# Patient Record
Sex: Male | Born: 1940 | Race: White | Hispanic: No | Marital: Married | Smoking: Never smoker
Health system: Southern US, Community
[De-identification: ages and names within clinical notes are randomized; demographics above are authoritative.]

## PROBLEM LIST (undated history)

## (undated) DIAGNOSIS — K219 Gastro-esophageal reflux disease without esophagitis: Secondary | ICD-10-CM

## (undated) DIAGNOSIS — F0390 Unspecified dementia without behavioral disturbance: Secondary | ICD-10-CM

## (undated) DIAGNOSIS — I10 Essential (primary) hypertension: Secondary | ICD-10-CM

## (undated) DIAGNOSIS — E781 Pure hyperglyceridemia: Secondary | ICD-10-CM

## (undated) DIAGNOSIS — N400 Enlarged prostate without lower urinary tract symptoms: Secondary | ICD-10-CM

## (undated) DIAGNOSIS — R972 Elevated prostate specific antigen [PSA]: Secondary | ICD-10-CM

## (undated) DIAGNOSIS — R351 Nocturia: Secondary | ICD-10-CM

## (undated) DIAGNOSIS — E119 Type 2 diabetes mellitus without complications: Secondary | ICD-10-CM

## (undated) HISTORY — DX: Type 2 diabetes mellitus without complications: E11.9

## (undated) HISTORY — DX: Nocturia: R35.1

## (undated) HISTORY — PX: EYE SURGERY: SHX253

## (undated) HISTORY — DX: Elevated prostate specific antigen (PSA): R97.20

## (undated) HISTORY — DX: Benign prostatic hyperplasia without lower urinary tract symptoms: N40.0

## (undated) HISTORY — DX: Pure hyperglyceridemia: E78.1

## (undated) HISTORY — PX: APPENDECTOMY: SHX54

## (undated) HISTORY — DX: Essential (primary) hypertension: I10

---

## 2011-03-10 ENCOUNTER — Ambulatory Visit: Payer: Self-pay | Admitting: Gastroenterology

## 2011-03-10 LAB — HM COLONOSCOPY

## 2011-04-19 ENCOUNTER — Emergency Department: Payer: Self-pay | Admitting: Internal Medicine

## 2011-05-11 ENCOUNTER — Inpatient Hospital Stay: Payer: Self-pay | Admitting: Internal Medicine

## 2011-09-26 ENCOUNTER — Emergency Department: Payer: Self-pay | Admitting: Emergency Medicine

## 2011-11-26 ENCOUNTER — Emergency Department: Payer: Self-pay | Admitting: Emergency Medicine

## 2012-09-20 ENCOUNTER — Emergency Department: Payer: Self-pay | Admitting: Emergency Medicine

## 2013-12-19 ENCOUNTER — Ambulatory Visit: Payer: Self-pay | Admitting: Ophthalmology

## 2013-12-26 ENCOUNTER — Ambulatory Visit: Payer: Self-pay | Admitting: Ophthalmology

## 2014-01-09 ENCOUNTER — Ambulatory Visit: Payer: Self-pay | Admitting: Ophthalmology

## 2014-07-15 ENCOUNTER — Inpatient Hospital Stay: Payer: Self-pay | Admitting: Internal Medicine

## 2014-07-15 DIAGNOSIS — Z800 Family history of malignant neoplasm of digestive organs: Secondary | ICD-10-CM | POA: Diagnosis not present

## 2014-07-15 DIAGNOSIS — E119 Type 2 diabetes mellitus without complications: Secondary | ICD-10-CM | POA: Diagnosis not present

## 2014-07-15 DIAGNOSIS — R69 Illness, unspecified: Secondary | ICD-10-CM | POA: Diagnosis not present

## 2014-07-15 DIAGNOSIS — E860 Dehydration: Secondary | ICD-10-CM | POA: Diagnosis not present

## 2014-07-15 DIAGNOSIS — Z7982 Long term (current) use of aspirin: Secondary | ICD-10-CM | POA: Diagnosis not present

## 2014-07-15 DIAGNOSIS — I10 Essential (primary) hypertension: Secondary | ICD-10-CM | POA: Diagnosis not present

## 2014-07-15 DIAGNOSIS — N179 Acute kidney failure, unspecified: Secondary | ICD-10-CM | POA: Diagnosis not present

## 2014-07-15 DIAGNOSIS — I959 Hypotension, unspecified: Secondary | ICD-10-CM | POA: Diagnosis not present

## 2014-07-15 DIAGNOSIS — R0602 Shortness of breath: Secondary | ICD-10-CM | POA: Diagnosis not present

## 2014-07-15 DIAGNOSIS — Z833 Family history of diabetes mellitus: Secondary | ICD-10-CM | POA: Diagnosis not present

## 2014-07-15 DIAGNOSIS — R531 Weakness: Secondary | ICD-10-CM | POA: Diagnosis not present

## 2014-07-15 DIAGNOSIS — E785 Hyperlipidemia, unspecified: Secondary | ICD-10-CM | POA: Diagnosis not present

## 2014-07-15 DIAGNOSIS — R55 Syncope and collapse: Secondary | ICD-10-CM | POA: Diagnosis not present

## 2014-07-15 LAB — BASIC METABOLIC PANEL
Anion Gap: 7 (ref 7–16)
BUN: 34 mg/dL — AB (ref 7–18)
CALCIUM: 8.8 mg/dL (ref 8.5–10.1)
Chloride: 106 mmol/L (ref 98–107)
Co2: 27 mmol/L (ref 21–32)
Creatinine: 2.28 mg/dL — ABNORMAL HIGH (ref 0.60–1.30)
EGFR (African American): 36 — ABNORMAL LOW
EGFR (Non-African Amer.): 30 — ABNORMAL LOW
Glucose: 195 mg/dL — ABNORMAL HIGH (ref 65–99)
Osmolality: 292 (ref 275–301)
POTASSIUM: 4.6 mmol/L (ref 3.5–5.1)
Sodium: 140 mmol/L (ref 136–145)

## 2014-07-15 LAB — CBC WITH DIFFERENTIAL/PLATELET
BASOS ABS: 0.1 10*3/uL (ref 0.0–0.1)
Basophil %: 0.8 %
Eosinophil #: 0.2 10*3/uL (ref 0.0–0.7)
Eosinophil %: 1.7 %
HCT: 39.9 % — ABNORMAL LOW (ref 40.0–52.0)
HGB: 13.2 g/dL (ref 13.0–18.0)
LYMPHS ABS: 1.5 10*3/uL (ref 1.0–3.6)
Lymphocyte %: 15.1 %
MCH: 30.1 pg (ref 26.0–34.0)
MCHC: 33.1 g/dL (ref 32.0–36.0)
MCV: 91 fL (ref 80–100)
MONO ABS: 0.8 x10 3/mm (ref 0.2–1.0)
MONOS PCT: 7.7 %
Neutrophil #: 7.7 10*3/uL — ABNORMAL HIGH (ref 1.4–6.5)
Neutrophil %: 74.7 %
Platelet: 230 10*3/uL (ref 150–440)
RBC: 4.39 10*6/uL — ABNORMAL LOW (ref 4.40–5.90)
RDW: 13.5 % (ref 11.5–14.5)
WBC: 10.3 10*3/uL (ref 3.8–10.6)

## 2014-07-15 LAB — TROPONIN I: Troponin-I: <0.02 ng/mL

## 2014-07-16 LAB — URINALYSIS, COMPLETE
BACTERIA: NONE SEEN
BILIRUBIN, UR: NEGATIVE
BLOOD: NEGATIVE
Glucose,UR: >=500 mg/dL (ref 0–75)
KETONE: NEGATIVE
Leukocyte Esterase: NEGATIVE
Nitrite: NEGATIVE
Ph: 5 (ref 4.5–8.0)
Protein: NEGATIVE
RBC,UR: <1 /HPF (ref 0–5)
SQUAMOUS EPITHELIAL: NONE SEEN
Specific Gravity: 1.018 (ref 1.003–1.030)
WBC UR: 1 /HPF (ref 0–5)

## 2014-07-16 LAB — BASIC METABOLIC PANEL
Anion Gap: 9 (ref 7–16)
BUN: 36 mg/dL — ABNORMAL HIGH (ref 7–18)
CALCIUM: 8.5 mg/dL (ref 8.5–10.1)
CHLORIDE: 109 mmol/L — AB (ref 98–107)
CO2: 24 mmol/L (ref 21–32)
CREATININE: 1.5 mg/dL — AB (ref 0.60–1.30)
EGFR (Non-African Amer.): 49 — ABNORMAL LOW
GFR CALC AF AMER: 59 — AB
Glucose: 142 mg/dL — ABNORMAL HIGH (ref 65–99)
Osmolality: 294 (ref 275–301)
Potassium: 3.7 mmol/L (ref 3.5–5.1)
Sodium: 142 mmol/L (ref 136–145)

## 2014-07-16 LAB — HEMOGLOBIN A1C: Hemoglobin A1C: 9.7 % — ABNORMAL HIGH (ref 4.2–6.3)

## 2014-07-16 LAB — TSH: Thyroid Stimulating Horm: 1.13 u[IU]/mL

## 2014-07-16 LAB — MAGNESIUM: Magnesium: 1.9 mg/dL

## 2014-07-17 LAB — BASIC METABOLIC PANEL
ANION GAP: 8 (ref 7–16)
BUN: 22 mg/dL — ABNORMAL HIGH (ref 7–18)
CALCIUM: 8.5 mg/dL (ref 8.5–10.1)
CHLORIDE: 105 mmol/L (ref 98–107)
CO2: 23 mmol/L (ref 21–32)
Creatinine: 1.21 mg/dL (ref 0.60–1.30)
EGFR (African American): >60
EGFR (Non-African Amer.): >60
Glucose: 268 mg/dL — ABNORMAL HIGH (ref 65–99)
OSMOLALITY: 285 (ref 275–301)
Potassium: 4.2 mmol/L (ref 3.5–5.1)
Sodium: 136 mmol/L (ref 136–145)

## 2014-07-31 DIAGNOSIS — E1122 Type 2 diabetes mellitus with diabetic chronic kidney disease: Secondary | ICD-10-CM | POA: Diagnosis not present

## 2014-07-31 DIAGNOSIS — N179 Acute kidney failure, unspecified: Secondary | ICD-10-CM | POA: Diagnosis not present

## 2014-07-31 DIAGNOSIS — I129 Hypertensive chronic kidney disease with stage 1 through stage 4 chronic kidney disease, or unspecified chronic kidney disease: Secondary | ICD-10-CM | POA: Diagnosis not present

## 2014-08-21 DIAGNOSIS — I129 Hypertensive chronic kidney disease with stage 1 through stage 4 chronic kidney disease, or unspecified chronic kidney disease: Secondary | ICD-10-CM | POA: Diagnosis not present

## 2014-08-21 DIAGNOSIS — E1122 Type 2 diabetes mellitus with diabetic chronic kidney disease: Secondary | ICD-10-CM | POA: Diagnosis not present

## 2014-08-21 DIAGNOSIS — R1030 Lower abdominal pain, unspecified: Secondary | ICD-10-CM | POA: Diagnosis not present

## 2014-08-21 DIAGNOSIS — E785 Hyperlipidemia, unspecified: Secondary | ICD-10-CM | POA: Diagnosis not present

## 2014-08-27 DIAGNOSIS — E1122 Type 2 diabetes mellitus with diabetic chronic kidney disease: Secondary | ICD-10-CM | POA: Diagnosis not present

## 2014-08-27 DIAGNOSIS — I129 Hypertensive chronic kidney disease with stage 1 through stage 4 chronic kidney disease, or unspecified chronic kidney disease: Secondary | ICD-10-CM | POA: Diagnosis not present

## 2014-08-27 DIAGNOSIS — N182 Chronic kidney disease, stage 2 (mild): Secondary | ICD-10-CM | POA: Diagnosis not present

## 2014-08-27 DIAGNOSIS — R1013 Epigastric pain: Secondary | ICD-10-CM | POA: Diagnosis not present

## 2014-10-06 NOTE — Op Note (Signed)
PATIENT NAME:  Scott Lawson, Scott Lawson MR#:  [id number] DATE OF BIRTH:  03/27/1941  DATE OF PROCEDURE:  12/26/2013  PREOPERATIVE DIAGNOSIS: Visually significant cataract of the left eye.   POSTOPERATIVE DIAGNOSIS: Visually significant cataract of the left eye.   OPERATIVE PROCEDURE: Cataract extraction by phacoemulsification with implant of intraocular lens to left eye.   SURGEON: Birder Robson, MD.   ANESTHESIA:  1. Managed anesthesia care.  2. Topical tetracaine drops followed by 2% Xylocaine jelly applied in the preoperative holding area.   COMPLICATIONS: None.   TECHNIQUE: Stop and chop.   DESCRIPTION OF PROCEDURE: The patient was examined and consented in the preoperative holding area where the aforementioned topical anesthesia was applied to the left eye and then brought back to the Operating Room where the left eye was prepped and draped in the usual sterile ophthalmic fashion and a lid speculum was placed. A paracentesis was created with the side port blade and the anterior chamber was filled with viscoelastic. A near clear corneal incision was performed with the steel keratome. A continuous curvilinear capsulorrhexis was performed with a cystotome followed by the capsulorrhexis forceps. Hydrodissection and hydrodelineation were carried out with BSS on a blunt cannula. The lens was removed in a stop and chop technique and the remaining cortical material was removed with the irrigation-aspiration handpiece. The capsular bag was inflated with viscoelastic and the Tecnis ZCB00 23.5-diopter lens, serial number [id number] was placed in the capsular bag without complication. The remaining viscoelastic was removed from the eye with the irrigation-aspiration handpiece. The wounds were hydrated. The anterior chamber was flushed with Miostat and the eye was inflated to physiologic pressure. 0.1 mL of cefuroxime concentration 10 mg/mL was placed in the anterior chamber. The wounds were found to be water  tight. The eye was dressed with Vigamox. The patient was given protective glasses to wear throughout the day and a shield with which to sleep tonight. The patient was also given drops with which to begin a drop regimen today and will follow-up with me in one day.    ____________________________ Livingston Diones. Julena Barbour, MD wlp:lt D: 12/26/2013 21:45:45 ET T: 12/27/2013 00:45:17 ET JOB#: [id number]  cc: Berdell L. Joshua Zeringue, MD, <Dictator> Livingston Diones Karlisha Mathena MD ELECTRONICALLY SIGNED 01/10/2014 17:07

## 2014-10-14 NOTE — Discharge Summary (Signed)
PATIENT NAME:  Scott Lawson, Scott Lawson MR#:  [id number] DATE OF BIRTH:  04-06-1941  DATE OF ADMISSION:  07/15/2014 DATE OF DISCHARGE:  07/17/2014  DISCHARGE DIAGNOSES:  1.  Acute renal failure, likely due to dehydration. 2.  Hypertension due to dehydration.   SECONDARY DIAGNOSES:   Hypertension, diabetes, and renal failure.  CONSULTATIONS: None.   PROCEDURES AND RADIOLOGY: Chest x-ray on January 31 showed no acute cardiopulmonary disease.   MAJOR LABORATORY PANEL: UA on admission was negative.   HISTORY AND SHORT HOSPITAL COURSE: The patient is a 74 year old male with above-mentioned medical problems who was admitted for generalized weakness, was found to have acute renal failure, thought to be due to dehydration and hypertension. Please see Dr. Lianne Moris dictated history and physical for further details. The patient was started on IV hydration, avoided any nephrotoxic medication. His renal function was slowly improving from 2.28 on admission and was normalized to 1.21 creatinine. On the day of discharge, he was feeling much better, was feeling back to baseline, was discharged home on February 2. On the date of discharge, his vital signs were are as follows: Temperature 97.3, heart rate 59 per minute, respirations 18 per minute, blood pressure 120/62. He was saturating 96% room air.   PERTINENT PHYSICAL EXAMINATION ON THE DATE OF DISCHARGE:  CARDIOVASCULAR: S1, S2 normal. No murmurs, rubs or gallops.  LUNGS: Clear to auscultation bilaterally. No wheezing, rales, rhonchi, or crepitation.  ABDOMEN: Soft, benign.  NEUROLOGIC: Nonfocal examination.   All other physical examination remained at baseline.   DISCHARGE MEDICATIONS:   Medication Instructions  finasteride 5 mg oral tablet  1 tab(s) orally once a day   aspirin 325 mg oral tablet  1 tab(s) orally once a day   terazosin 2 mg oral capsule  1 cap(s) orally once a day (at bedtime)   losartan 50 mg oral tablet  1 tab(s) orally once a day    lisinopril 20 mg oral tablet  1 tab(s) orally once a day   atorvastatin 40 mg oral tablet  1 tab(s) orally once a day (at bedtime)   glipizide 5 mg oral tablet  1 tab(s) orally twice a day    DISCHARGE DIET: Low sodium, low fat, low cholesterol, 1800 ADA.   DISCHARGE ACTIVITY: As tolerated.   DISCHARGE INSTRUCTIONS AND FOLLOWUP: The patient was instructed to follow up with his primary care physician, Dr. Golden Pop, in 1-2 weeks.   TOTAL TIME DISCHARGING THIS PATIENT: Fifty-five minutes.    ____________________________ Lucina Mellow. Manuella Ghazi, MD vss:TT D: 07/19/2014 17:14:10 ET T: 07/19/2014 18:11:16 ET JOB#: [id number]  cc: Aslyn Cottman S. Manuella Ghazi, MD, <Dictator> Guadalupe Maple, MD Lucina Mellow [facility] MD ELECTRONICALLY SIGNED 07/23/2014 14:33
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
## 2014-11-02 ENCOUNTER — Emergency Department: Payer: Commercial Managed Care - HMO

## 2014-11-02 ENCOUNTER — Encounter: Payer: Self-pay | Admitting: Emergency Medicine

## 2014-11-02 ENCOUNTER — Other Ambulatory Visit: Payer: Self-pay

## 2014-11-02 ENCOUNTER — Emergency Department
Admission: EM | Admit: 2014-11-02 | Discharge: 2014-11-02 | Disposition: A | Discharge status: Home / Self Care | Payer: Commercial Managed Care - HMO | Attending: Emergency Medicine | Admitting: Emergency Medicine

## 2014-11-02 DIAGNOSIS — R0602 Shortness of breath: Secondary | ICD-10-CM | POA: Diagnosis not present

## 2014-11-02 DIAGNOSIS — J9801 Acute bronchospasm: Secondary | ICD-10-CM | POA: Insufficient documentation

## 2014-11-02 DIAGNOSIS — R05 Cough: Secondary | ICD-10-CM | POA: Diagnosis not present

## 2014-11-02 LAB — CBC
HCT: 40.6 % (ref 40.0–52.0)
Hemoglobin: 13.3 g/dL (ref 13.0–18.0)
MCH: 29.8 pg (ref 26.0–34.0)
MCHC: 32.8 g/dL (ref 32.0–36.0)
MCV: 90.8 fL (ref 80.0–100.0)
Platelets: 220 10*3/uL (ref 150–440)
RBC: 4.47 MIL/uL (ref 4.40–5.90)
RDW: 13.3 % (ref 11.5–14.5)
WBC: 8.4 10*3/uL (ref 3.8–10.6)

## 2014-11-02 LAB — BASIC METABOLIC PANEL
ANION GAP: 9 (ref 5–15)
BUN: 20 mg/dL (ref 6–20)
CALCIUM: 9 mg/dL (ref 8.9–10.3)
CO2: 25 mmol/L (ref 22–32)
CREATININE: 1.01 mg/dL (ref 0.61–1.24)
Chloride: 105 mmol/L (ref 101–111)
GFR calc Af Amer: >60 mL/min (ref >60)
GFR calc non Af Amer: >60 mL/min (ref >60)
Glucose, Bld: 149 mg/dL — ABNORMAL HIGH (ref 65–99)
Potassium: 3.7 mmol/L (ref 3.5–5.1)
Sodium: 139 mmol/L (ref 135–145)

## 2014-11-02 LAB — TROPONIN I

## 2014-11-02 MED ORDER — IPRATROPIUM-ALBUTEROL 0.5-2.5 (3) MG/3ML IN SOLN
3.0000 mL | RESPIRATORY_TRACT | Status: DC
  Administered 2014-11-02: 3 mL via RESPIRATORY_TRACT

## 2014-11-02 MED ORDER — IPRATROPIUM-ALBUTEROL 0.5-2.5 (3) MG/3ML IN SOLN
RESPIRATORY_TRACT | Status: AC
  Administered 2014-11-02: 3 mL via RESPIRATORY_TRACT
  Filled 2014-11-02: qty 3

## 2014-11-02 MED ORDER — ALBUTEROL SULFATE HFA 108 (90 BASE) MCG/ACT IN AERS: 2.0000 | INHALATION_SPRAY | Freq: Four times a day (QID) | RESPIRATORY_TRACT | Status: DC | PRN

## 2014-11-02 NOTE — Discharge Instructions (Signed)
Bronchospasm °A bronchospasm is a spasm or tightening of the airways going into the lungs. During a bronchospasm breathing becomes more difficult because the airways get smaller. When this happens there can be coughing, a whistling sound when breathing (wheezing), and difficulty breathing. Bronchospasm is often associated with asthma, but not all patients who experience a bronchospasm have asthma. °CAUSES  °A bronchospasm is caused by inflammation or irritation of the airways. The inflammation or irritation may be triggered by:  °· Allergies (such as to animals, pollen, food, or mold). Allergens that cause bronchospasm may cause wheezing immediately after exposure or many hours later.   °· Infection. Viral infections are believed to be the most common cause of bronchospasm.   °· Exercise.   °· Irritants (such as pollution, cigarette smoke, strong odors, aerosol sprays, and paint fumes).   °· Weather changes. Winds increase molds and pollens in the air. Rain refreshes the air by washing irritants out. Cold air may cause inflammation.   °· Stress and emotional upset.   °SIGNS AND SYMPTOMS  °· Wheezing.   °· Excessive nighttime coughing.   °· Frequent or severe coughing with a simple cold.   °· Chest tightness.   °· Shortness of breath.   °DIAGNOSIS  °Bronchospasm is usually diagnosed through a history and physical exam. Tests, such as chest X-rays, are sometimes done to look for other conditions. °TREATMENT  °· Inhaled medicines can be given to open up your airways and help you breathe. The medicines can be given using either an inhaler or a nebulizer machine. °· Corticosteroid medicines may be given for severe bronchospasm, usually when it is associated with asthma. °HOME CARE INSTRUCTIONS  °· Always have a plan prepared for seeking medical care. Know when to call your health care provider and local emergency services (911 in the [place] Know where you can access local emergency care. °· Only take medicines as  directed by your health care provider. °· If you were prescribed an inhaler or nebulizer machine, ask your health care provider to explain how to use it correctly. Always use a spacer with your inhaler if you were given one. °· It is necessary to remain calm during an attack. Try to relax and breathe more slowly.  °· Control your home environment in the following ways:   °¨ Change your heating and air conditioning filter at least once a month.   °¨ Limit your use of fireplaces and wood stoves. °¨ Do not smoke and do not allow smoking in your home.   °¨ Avoid exposure to perfumes and fragrances.   °¨ Get rid of pests (such as roaches and mice) and their droppings.   °¨ Throw away plants if you see mold on them.   °¨ Keep your house clean and dust free.   °¨ Replace carpet with wood, tile, or vinyl flooring. Carpet can trap dander and dust.   °¨ Use allergy-proof pillows, mattress covers, and box spring covers.   °¨ Wash bed sheets and blankets every week in hot water and dry them in a dryer.   °¨ Use blankets that are made of polyester or cotton.   °¨ Wash hands frequently. °SEEK MEDICAL CARE IF:  °· You have muscle aches.   °· You have chest pain.   °· The sputum changes from clear or white to yellow, green, gray, or bloody.   °· The sputum you cough up gets thicker.   °· There are problems that may be related to the medicine you are given, such as a rash, itching, swelling, or trouble breathing.   °SEEK IMMEDIATE MEDICAL CARE IF:  °· You have worsening wheezing and coughing even   after taking your prescribed medicines.   °· You have increased difficulty breathing.   °· You develop severe chest pain. °MAKE SURE YOU:  °· Understand these instructions. °· Will watch your condition. °· Will get help right away if you are not doing well or get worse. °Document Released: 06/04/2003 Document Revised: 06/06/2013 Document Reviewed: 11/21/2012 °ExitCare® Patient Information ©2015 [employer]. This information is not  intended to replace advice given to you by your health care provider. Make sure you discuss any questions you have with your health care provider. ° °

## 2014-11-02 NOTE — ED Provider Notes (Signed)
[facility] Emergency Department Provider Note  ____________________________________________  Time seen: 11 AM  I have reviewed the triage vital signs and the nursing notes.   HISTORY  Chief Complaint Shortness of Breath      HPI Scott Lawson. is a 74 y.o. male who presents with complaints of mild cough and moderate congestion in his right chest for several days. He reports he has had pneumonia in the past and he is concerned that might be what he has now. He denies smoking. He does not have pain in his chest he says just congestion and when he coughs he does bring up some mucus. Denies fevers denies chills. He rates the severity of 5.     History reviewed. No pertinent past medical history.  There are no active problems to display for this patient.  Patient reports he had pneumonia as a child     History reviewed. No pertinent past surgical history.  No current outpatient prescriptions on file.  Allergies Review of patient's allergies indicates not on file.  History reviewed. No pertinent family history.  Social History History  Substance Use Topics  . Smoking status: Never Smoker   . Smokeless tobacco: Never Used  . Alcohol Use: No    Review of Systems  Constitutional: Negative for fever. Eyes: Negative for visual changes. ENT: Negative for sore throat. Cardiovascular: Negative for chest pain. Respiratory: Negative for shortness of breath. Positive for cough Gastrointestinal: Negative for abdominal pain, vomiting and diarrhea. Genitourinary: Negative for dysuria. Musculoskeletal: Negative for back pain. Skin: Negative for rash. Neurological: Negative for headaches, focal weakness or numbness.  10-point ROS otherwise negative.  ____________________________________________   PHYSICAL EXAM:  VITAL SIGNS: ED Triage Vitals  Enc Vitals Group     BP 11/02/14 0951 142/79 mmHg     Pulse Rate 11/02/14 0951 69     Resp  --      Temp 11/02/14 0951 97.8 F (36.6 C)     Temp Source 11/02/14 0951 Oral     SpO2 11/02/14 0951 98 %     Weight 11/02/14 0951 210 lb (95.255 kg)     Height 11/02/14 0951 5\' 9"  (1.753 m)     Head Cir --      Peak Flow --      Pain Score --      Pain Loc --      Pain Edu? --      Excl. in [place]? --     Constitutional: Alert and oriented. Well appearing and in no distress. Sitting on the edge of the bed  Eyes: Conjunctivae are normal. PERRL. Normal extraocular movements. ENT   Head: Normocephalic and atraumatic.   Nose: No congestion/rhinnorhea.   Mouth/Throat: Mucous membranes are moist.   Neck: No stridor. Hematological/Lymphatic/Immunilogical: No cervical lymphadenopathy. Cardiovascular: Normal rate, regular rhythm. Normal and symmetric distal pulses are present in all extremities. No murmurs, rubs, or gallops. Respiratory: Normal respiratory effort without tachypnea nor retractions. Breath sounds are clear and equal bilaterally. No wheezes/rales/rhonchi. Gastrointestinal: Soft and nontender. No distention. There is no CVA tenderness. Genitourinary: deferred Musculoskeletal: Nontender with normal range of motion in all extremities. No joint effusions.  No lower extremity tenderness nor edema. Neurologic:  Normal speech and language. No gross focal neurologic deficits are appreciated. Speech is normal.  Skin:  Skin is warm, dry and intact. No rash noted. Psychiatric: Mood and affect are normal. Speech and behavior are normal. Patient exhibits appropriate insight and judgment.  ____________________________________________  LABS (pertinent positives/negatives)  Unremarkable  ____________________________________________   EKG  ED ECG REPORT I, Lavonia Drafts, the attending physician, personally viewed and interpreted this ECG.   Date: 11/02/2014  EKG Time: 10:03 AM  Rate: 61  Rhythm: normal sinus rhythm, left ventricular hypertrophy  Axis: Left axis  deviation  Intervals:left anterior fascicular block  ST&T Change: Nonspecific   ____________________________________________    RADIOLOGY  No Acute distress  ____________________________________________   PROCEDURES  Procedure(s) performed: None  Critical Care performed: None    ____________________________________________   INITIAL IMPRESSION / ASSESSMENT AND PLAN / ED COURSE  Pertinent labs & imaging results that were available during my care of the patient were reviewed by me and considered in my medical decision making (see chart for details).  Patient well-appearing. Chest x-ray normal, white blood cell count normal, no fever. Troponin unremarkable. Benign exam. We will treat with a DuoNeb for possible bronchospasm area  ____________________________________________ ----------------------------------------- 1:02 PM on 11/02/2014 -----------------------------------------  After DuoNeb patient reports feeling significantly better, and indeed I can hear more air flow through his lungs. I will discharge for PCP follow-up and write prescription for an albuterol inhaler as needed  FINAL CLINICAL IMPRESSION(S) / ED DIAGNOSES  Final diagnoses:  Bronchospasm     Lavonia Drafts, MD 11/02/14 1302

## 2014-11-02 NOTE — ED Notes (Signed)
Pt states he has been coughing for 2 weeks now, has pain in his chest when he coughs, appears in no distress at this time.

## 2014-11-13 DIAGNOSIS — E785 Hyperlipidemia, unspecified: Secondary | ICD-10-CM | POA: Diagnosis not present

## 2014-11-13 DIAGNOSIS — E1122 Type 2 diabetes mellitus with diabetic chronic kidney disease: Secondary | ICD-10-CM | POA: Diagnosis not present

## 2014-11-13 DIAGNOSIS — I129 Hypertensive chronic kidney disease with stage 1 through stage 4 chronic kidney disease, or unspecified chronic kidney disease: Secondary | ICD-10-CM | POA: Diagnosis not present

## 2014-11-19 ENCOUNTER — Telehealth: Payer: Self-pay

## 2014-11-19 NOTE — Telephone Encounter (Signed)
Patient called requesting losartan 50mg  refill.  He needs this sent to [facility].  Patient also wanted to make [place] aware he is allergic to Trazodone.   Added this allergy to his chart.  Patient concerned that he keeps getting trazodone deliver to him.  Advised patient to contact mail order pharmacy to discontinue getting trazodone.  Tried to contact mail order pharmacy via the phone number the patient gave me, when I finally got threw [facility] was having technical difficulties and said to call back at a later time.  Will route message to follow up later.

## 2014-11-20 NOTE — Telephone Encounter (Signed)
Spoke with [facility] regarding patients medications.  They do not have an rx for trazodone.  Verified with patient that the medication he is allergic to is actually Terozosin.  [facility] pharmacy has discontinued the Terozosin and added it to his allergy list.  He also has refills on his losartan at this time and he will just have to call [facility] when he needs a refill.  Patient aware of the following.

## 2015-02-11 DIAGNOSIS — N400 Enlarged prostate without lower urinary tract symptoms: Secondary | ICD-10-CM | POA: Insufficient documentation

## 2015-02-11 DIAGNOSIS — N182 Chronic kidney disease, stage 2 (mild): Secondary | ICD-10-CM

## 2015-02-11 DIAGNOSIS — N1831 Chronic kidney disease, stage 3a: Secondary | ICD-10-CM | POA: Insufficient documentation

## 2015-02-11 DIAGNOSIS — N183 Chronic kidney disease, stage 3 (moderate): Secondary | ICD-10-CM

## 2015-02-11 DIAGNOSIS — I129 Hypertensive chronic kidney disease with stage 1 through stage 4 chronic kidney disease, or unspecified chronic kidney disease: Secondary | ICD-10-CM

## 2015-02-11 DIAGNOSIS — E1122 Type 2 diabetes mellitus with diabetic chronic kidney disease: Secondary | ICD-10-CM

## 2015-02-11 DIAGNOSIS — E785 Hyperlipidemia, unspecified: Secondary | ICD-10-CM

## 2015-02-11 DIAGNOSIS — E1169 Type 2 diabetes mellitus with other specified complication: Secondary | ICD-10-CM | POA: Insufficient documentation

## 2015-02-12 ENCOUNTER — Ambulatory Visit (INDEPENDENT_AMBULATORY_CARE_PROVIDER_SITE_OTHER): Payer: Commercial Managed Care - HMO | Admitting: Unknown Physician Specialty

## 2015-02-12 ENCOUNTER — Encounter: Payer: Self-pay | Admitting: Unknown Physician Specialty

## 2015-02-12 VITALS — BP 152/84 | HR 59 | Temp 98.0°F | Ht 66.7 in | Wt 237.6 lb

## 2015-02-12 DIAGNOSIS — N189 Chronic kidney disease, unspecified: Secondary | ICD-10-CM

## 2015-02-12 DIAGNOSIS — I129 Hypertensive chronic kidney disease with stage 1 through stage 4 chronic kidney disease, or unspecified chronic kidney disease: Secondary | ICD-10-CM

## 2015-02-12 DIAGNOSIS — N181 Chronic kidney disease, stage 1: Secondary | ICD-10-CM | POA: Diagnosis not present

## 2015-02-12 DIAGNOSIS — M546 Pain in thoracic spine: Secondary | ICD-10-CM

## 2015-02-12 DIAGNOSIS — N183 Chronic kidney disease, stage 3 (moderate): Secondary | ICD-10-CM

## 2015-02-12 DIAGNOSIS — N182 Chronic kidney disease, stage 2 (mild): Secondary | ICD-10-CM

## 2015-02-12 DIAGNOSIS — N184 Chronic kidney disease, stage 4 (severe): Secondary | ICD-10-CM

## 2015-02-12 DIAGNOSIS — E785 Hyperlipidemia, unspecified: Secondary | ICD-10-CM

## 2015-02-12 DIAGNOSIS — N185 Chronic kidney disease, stage 5: Secondary | ICD-10-CM | POA: Diagnosis not present

## 2015-02-12 DIAGNOSIS — E1122 Type 2 diabetes mellitus with diabetic chronic kidney disease: Secondary | ICD-10-CM

## 2015-02-12 LAB — UA/M W/RFLX CULTURE, ROUTINE
Bilirubin, UA: NEGATIVE
Ketones, UA: NEGATIVE
Leukocytes, UA: NEGATIVE
NITRITE UA: NEGATIVE
Protein, UA: NEGATIVE
RBC UA: NEGATIVE
Specific Gravity, UA: 1.01 (ref 1.005–1.030)
UUROB: 0.2 mg/dL (ref 0.2–1.0)
pH, UA: 5 (ref 5.0–7.5)

## 2015-02-12 LAB — LIPID PANEL PICCOLO, WAIVED
Chol/HDL Ratio Piccolo,Waive: 3.6 mg/dL
Cholesterol Piccolo, Waived: 178 mg/dL (ref <200)
HDL CHOL PICCOLO, WAIVED: 50 mg/dL — AB (ref >59)
LDL Chol Calc Piccolo Waived: 85 mg/dL (ref <100)
Triglycerides Piccolo,Waived: 216 mg/dL — ABNORMAL HIGH (ref <150)
VLDL Chol Calc Piccolo,Waive: 43 mg/dL — ABNORMAL HIGH (ref <30)

## 2015-02-12 LAB — BAYER DCA HB A1C WAIVED: HB A1C: 8 % — AB (ref <7.0)

## 2015-02-12 LAB — MICROALBUMIN, URINE WAIVED
CREATININE, URINE WAIVED: 50 mg/dL (ref 10–300)
Microalb, Ur Waived: 10 mg/L (ref 0–19)

## 2015-02-12 MED ORDER — METFORMIN HCL 500 MG PO TABS: 500.0000 mg | ORAL_TABLET | Freq: Two times a day (BID) | ORAL | Status: DC

## 2015-02-12 NOTE — Progress Notes (Signed)
BP 152/84 mmHg  Pulse 59  Temp(Src) 98 F (36.7 C)  Ht 5' 6.7" (1.694 m)  Wt 237 lb 9.6 oz (107.775 kg)  BMI 37.56 kg/m2  SpO2 96%   Subjective:    Patient ID: Scott Roof., male    DOB: 25-Mar-1941, 73 y.o.   MRN: [id number]  HPI: Scott Doolan. is a 74 y.o. male  Chief Complaint  Patient presents with  . Diabetes  . Hyperlipidemia  . Hypertension    Diabetes Mellitus: Currently taking 35units of Lantus QHS. Monitors blood sugars twice daily with readings of 80-85 in the AM and 115-120 in the afternoon. No change in diet. Denies blurred vision or diplopia, dysuria or polydipsia. Denies change in sensation, no numbness.  Hypertension/Hyperlipidemia: Does not monitor blood pressure at home. Denies headaches, shortness of breath. Currently stable on losartan, lisinopril and atorvastatin. No missed doses.     Relevant past medical, surgical, family and social history reviewed and updated as indicated. Interim medical history since our last visit reviewed.reviewed and updated.  Review of Systems  Constitutional: Negative.  Negative for fever, chills, activity change and appetite change.  HENT: Negative.  Negative for congestion, sinus pressure, sneezing and sore throat.   Respiratory: Negative.  Negative for cough, chest tightness, shortness of breath, wheezing and stridor.   Cardiovascular: Negative.  Negative for chest pain, palpitations and leg swelling.  Genitourinary: Negative.  Negative for dysuria, urgency, hematuria, flank pain, decreased urine volume and difficulty urinating.  Skin: Negative.  Negative for color change, pallor, rash and wound.  Psychiatric/Behavioral: Negative.  Negative for behavioral problems, confusion and sleep disturbance. The patient is not nervous/anxious.     Per HPI unless specifically indicated above     Objective:        Physical Exam  Constitutional: He is oriented to person, place, and time. He appears well-developed  and well-nourished. No distress.  HENT:  Head: Normocephalic and atraumatic.  Cardiovascular: Normal rate, regular rhythm and normal heart sounds.  Exam reveals no gallop and no friction rub.   No murmur heard. Pulmonary/Chest: Effort normal and breath sounds normal.  Neurological: He is alert and oriented to person, place, and time.  Skin: Skin is warm and dry. No rash noted. He is not diaphoretic. No erythema. No pallor.  Psychiatric: He has a normal mood and affect. His behavior is normal. Judgment and thought content normal.        Assessment & Plan:   Problem List Items Addressed This Visit      Unprioritized   CKD (chronic kidney disease), stage II - Primary    Awaiting CMP.       Relevant Orders   Comprehensive metabolic panel   Hyperlipidemia   Relevant Medications   lisinopril (PRINIVIL,ZESTRIL) 20 MG tablet   Other Relevant Orders   Lipid Panel Piccolo, Waived   Type 2 diabetes mellitus    A1C today is 8.0, increased from 7.6 in May. Added Metformin 500mg  daily then increase to twice daily in two weeks      Relevant Medications   lisinopril (PRINIVIL,ZESTRIL) 20 MG tablet   metFORMIN (GLUCOPHAGE) 500 MG tablet   Other Relevant Orders   Bayer DCA Hb A1c Waived   Microalbumin, Urine Waived   Uric acid   Hypertensive CKD (chronic kidney disease)    Blood pressure well controlled on losartan 50mg  and lisinopril 20mg .      Relevant Orders   Comprehensive metabolic panel    Other  Visit Diagnoses    Right-sided thoracic back pain        Chest x-ray reviewed - nothing abnormal. UA normal.    Relevant Orders    UA/M w/rflx Culture, Routine        Follow up plan: Follow up in 3 months

## 2015-02-12 NOTE — Assessment & Plan Note (Addendum)
Blood pressure well controlled on losartan 50mg  and lisinopril 20mg .

## 2015-02-12 NOTE — Assessment & Plan Note (Signed)
Awaiting CMP

## 2015-02-12 NOTE — Assessment & Plan Note (Signed)
A1C today is 8.0, increased from 7.6 in May. Added Metformin 500mg  daily then increase to twice daily in two weeks

## 2015-02-13 LAB — COMPREHENSIVE METABOLIC PANEL
ALT: 17 IU/L (ref 0–44)
AST: 15 IU/L (ref 0–40)
Albumin/Globulin Ratio: 1.7 (ref 1.1–2.5)
Albumin: 4.3 g/dL (ref 3.5–4.8)
Alkaline Phosphatase: 62 IU/L (ref 39–117)
BUN/Creatinine Ratio: 21 (ref 10–22)
BUN: 23 mg/dL (ref 8–27)
Bilirubin Total: 0.5 mg/dL (ref 0.0–1.2)
CALCIUM: 9.1 mg/dL (ref 8.6–10.2)
CO2: 21 mmol/L (ref 18–29)
CREATININE: 1.1 mg/dL (ref 0.76–1.27)
Chloride: 99 mmol/L (ref 97–108)
GFR calc Af Amer: 77 mL/min/{1.73_m2} (ref >59)
GFR calc non Af Amer: 66 mL/min/{1.73_m2} (ref >59)
GLOBULIN, TOTAL: 2.5 g/dL (ref 1.5–4.5)
Glucose: 242 mg/dL — ABNORMAL HIGH (ref 65–99)
Potassium: 4.5 mmol/L (ref 3.5–5.2)
Sodium: 138 mmol/L (ref 134–144)
Total Protein: 6.8 g/dL (ref 6.0–8.5)

## 2015-02-13 LAB — URIC ACID: Uric Acid: 4.9 mg/dL (ref 3.7–8.6)

## 2015-02-14 ENCOUNTER — Telehealth: Payer: Self-pay

## 2015-02-14 NOTE — Telephone Encounter (Signed)
Patient returned call and I let him know that Malachy Mood said it was ok to wait for [facility] order.

## 2015-02-14 NOTE — Telephone Encounter (Signed)
Please let patient know it is not urgent to start soon.  It is fine to wait for the [facility] order to come in.

## 2015-02-14 NOTE — Telephone Encounter (Signed)
Tried to call patient but mail box was full so I was unable to leave a message. Will try again later.

## 2015-02-14 NOTE — Telephone Encounter (Signed)
Pharmacy called and stated the patient called them to see if rx was ready and they didn't have anything so they called [place] to see if we were supposed to send a medication but they didn't know which one. So I called the patient to find out what medication it was and it was the metformin. It was sent to [facility] on 02/12/15 but the patient states that enough medication was supposed to be sent to [facility] in [place] to get him enough until the [facility] order comes in. If you send some so the patient doesn't have to wait for [facility] order, make sure it is sent to [facility] in [place] please.

## 2015-02-22 ENCOUNTER — Ambulatory Visit: Payer: Self-pay | Admitting: Unknown Physician Specialty

## 2015-03-13 ENCOUNTER — Other Ambulatory Visit: Payer: Self-pay | Admitting: Unknown Physician Specialty

## 2015-03-15 ENCOUNTER — Other Ambulatory Visit: Payer: Self-pay | Admitting: Unknown Physician Specialty

## 2015-05-15 ENCOUNTER — Encounter: Payer: Self-pay | Admitting: Unknown Physician Specialty

## 2015-05-15 ENCOUNTER — Ambulatory Visit (INDEPENDENT_AMBULATORY_CARE_PROVIDER_SITE_OTHER): Payer: Commercial Managed Care - HMO | Admitting: Unknown Physician Specialty

## 2015-05-15 VITALS — BP 134/71 | HR 58 | Temp 98.1°F | Ht 67.2 in | Wt 236.8 lb

## 2015-05-15 DIAGNOSIS — N400 Enlarged prostate without lower urinary tract symptoms: Secondary | ICD-10-CM

## 2015-05-15 DIAGNOSIS — N182 Chronic kidney disease, stage 2 (mild): Secondary | ICD-10-CM

## 2015-05-15 DIAGNOSIS — E1122 Type 2 diabetes mellitus with diabetic chronic kidney disease: Secondary | ICD-10-CM | POA: Diagnosis not present

## 2015-05-15 DIAGNOSIS — Z794 Long term (current) use of insulin: Secondary | ICD-10-CM | POA: Diagnosis not present

## 2015-05-15 DIAGNOSIS — Z23 Encounter for immunization: Secondary | ICD-10-CM

## 2015-05-15 LAB — BAYER DCA HB A1C WAIVED: HB A1C: 8.4 % — AB (ref <7.0)

## 2015-05-15 MED ORDER — TAMSULOSIN HCL 0.4 MG PO CAPS: 0.4000 mg | ORAL_CAPSULE | Freq: Every day | ORAL | Status: DC

## 2015-05-15 NOTE — Progress Notes (Signed)
BP 134/71 mmHg  Pulse 58  Temp(Src) 98.1 F (36.7 C)  Ht 5' 7.2" (1.707 m)  Wt 236 lb 12.8 oz (107.412 kg)  BMI 36.86 kg/m2  SpO2 97%   Subjective:    Patient ID: Scott Lawson., male    DOB: [birth year], 74 y.o.   MRN: [id number]  HPI: Scott Lawson. is a 74 y.o. male  Chief Complaint  Patient presents with  . Diabetes  . Hyperlipidemia  . Hypertension   Diabetes:  Taking 35 units of Lantus Using medications without difficulties No hypoglycemic episodes No hyperglycemic episodes Feet problems: none Blood Sugars averaging: Good in the AM but in the afternoon 180-190 eye exam within last year  Hypertension:  Using medications without difficulty Average home BPs   Using medication without problems or lightheadedness No chest pain with exertion or shortness of breath No Edema Average home BPs:  Elevated Cholesterol: Using medications without problems: No Muscle aches  Diet compliance Exercise   Relevant past medical, surgical, family and social history reviewed and updated as indicated. Interim medical history since our last visit reviewed. Allergies and medications reviewed and updated.  Review of Systems  Genitourinary: Positive for frequency.       States urinating 4 times a day and multiple times at night.  He received something for this previously which cleared it up.  He thinks it was 3 years ago   Review of chart shows he sees a urologist and is taking Finesteride.  I had him on Flomax at one time  Per HPI unless specifically indicated above     Objective:    BP 134/71 mmHg  Pulse 58  Temp(Src) 98.1 F (36.7 C)  Ht 5' 7.2" (1.707 m)  Wt 236 lb 12.8 oz (107.412 kg)  BMI 36.86 kg/m2  SpO2 97%  Wt Readings from Last 3 Encounters:  05/15/15 236 lb 12.8 oz (107.412 kg)  02/12/15 237 lb 9.6 oz (107.775 kg)  02/11/15 231 lb (104.781 kg)    Physical Exam  Constitutional: He is oriented to person, place, and time. He appears  well-developed and well-nourished. No distress.  HENT:  Head: Normocephalic and atraumatic.  Eyes: Conjunctivae and lids are normal. Right eye exhibits no discharge. Left eye exhibits no discharge. No scleral icterus.  Cardiovascular: Normal rate, regular rhythm and normal heart sounds.   Pulmonary/Chest: Effort normal and breath sounds normal.  Abdominal: Normal appearance. There is no splenomegaly or hepatomegaly.  Musculoskeletal: Normal range of motion.  Neurological: He is alert and oriented to person, place, and time.  Skin: Skin is intact. No rash noted. No pallor.  Psychiatric: He has a normal mood and affect. His behavior is normal. Judgment and thought content normal.   Diabetic Foot Exam - Simple   Simple Foot Form  Diabetic Foot exam was performed with the following findings:  Yes 05/15/2015  9:17 AM  Visual Inspection  No deformities, no ulcerations, no other skin breakdown bilaterally:  Yes  Sensation Testing  Intact to touch and monofilament testing bilaterally:  Yes  Pulse Check  Posterior Tibialis and Dorsalis pulse intact bilaterally:  Yes  Comments  Nails brown and brittle.  Will get a nail trim       Results for orders placed or performed in visit on 02/12/15  Comprehensive metabolic panel  Result Value Ref Range   Glucose 242 (H) 65 - 99 mg/dL   BUN 23 8 - 27 mg/dL   Creatinine, Ser 1.10 0.76 -  1.27 mg/dL   GFR calc non Af Amer 66 >59 mL/min/1.73   GFR calc Af Amer 77 >59 mL/min/1.73   BUN/Creatinine Ratio 21 10 - 22   Sodium 138 134 - 144 mmol/L   Potassium 4.5 3.5 - 5.2 mmol/L   Chloride 99 97 - 108 mmol/L   CO2 21 18 - 29 mmol/L   Calcium 9.1 8.6 - 10.2 mg/dL   Total Protein 6.8 6.0 - 8.5 g/dL   Albumin 4.3 3.5 - 4.8 g/dL   Globulin, Total 2.5 1.5 - 4.5 g/dL   Albumin/Globulin Ratio 1.7 1.1 - 2.5   Bilirubin Total 0.5 0.0 - 1.2 mg/dL   Alkaline Phosphatase 62 39 - 117 IU/L   AST 15 0 - 40 IU/L   ALT 17 0 - 44 IU/L  Bayer DCA Hb A1c Waived   Result Value Ref Range   Bayer DCA Hb A1c Waived 8.0 (H) <7.0 %  Lipid Panel Piccolo, Waived  Result Value Ref Range   Cholesterol Piccolo, Waived 178 <200 mg/dL   HDL Chol Piccolo, Waived 50 (L) >59 mg/dL   Triglycerides Piccolo,Waived 216 (H) <150 mg/dL   Chol/HDL Ratio Piccolo,Waive 3.6 mg/dL   LDL Chol Calc Piccolo Waived 85 <100 mg/dL   VLDL Chol Calc Piccolo,Waive 43 (H) <30 mg/dL  Uric acid  Result Value Ref Range   Uric Acid 4.9 3.7 - 8.6 mg/dL  UA/M w/rflx Culture, Routine  Result Value Ref Range   Specific Gravity, UA 1.010 1.005 - 1.030   pH, UA 5.0 5.0 - 7.5   Color, UA Yellow Yellow   Appearance Ur Clear Clear   Leukocytes, UA Negative Negative   Protein, UA Negative Negative/Trace   Glucose, UA 3+ (A) Negative   Ketones, UA Negative Negative   RBC, UA Negative Negative   Bilirubin, UA Negative Negative   Urobilinogen, Ur 0.2 0.2 - 1.0 mg/dL   Nitrite, UA Negative Negative  Microalbumin, Urine Waived  Result Value Ref Range   Microalb, Ur Waived 10 0 - 19 mg/L   Creatinine, Urine Waived 50 10 - 300 mg/dL   Microalb/Creat Ratio 30-300 (H) <30 mg/g      Assessment & Plan:   Problem List Items Addressed This Visit      Unprioritized   BPH (benign prostatic hyperplasia)    Add Flomax.  Sees Urologist      Relevant Medications   tamsulosin (FLOMAX) 0.4 MG CAPS capsule   Type 2 diabetes mellitus (HCC)    Hgb A1C is 8.4, up from 8.0.  Will refer to diabetes education rather than adding insulin.  This helped him in the past      Relevant Orders   Flu Vaccine QUAD 36+ mos IM (Completed)   Bayer DCA Hb A1c Waived   Comprehensive metabolic panel   Ambulatory referral to diabetic education    Other Visit Diagnoses    Immunization due    -  Primary    Relevant Orders    Flu Vaccine QUAD 36+ mos IM (Completed)    Bayer DCA Hb A1c Waived    Comprehensive metabolic panel        Follow up plan: Return in about 3 months (around 08/13/2015).

## 2015-05-15 NOTE — Assessment & Plan Note (Signed)
Add Flomax.  Sees Urologist

## 2015-05-15 NOTE — Assessment & Plan Note (Signed)
Hgb A1C is 8.4, up from 8.0.  Will refer to diabetes education rather than adding insulin.  This helped him in the past

## 2015-05-16 LAB — COMPREHENSIVE METABOLIC PANEL
A/G RATIO: 1.6 (ref 1.1–2.5)
ALT: 15 IU/L (ref 0–44)
AST: 12 IU/L (ref 0–40)
Albumin: 4.2 g/dL (ref 3.5–4.8)
Alkaline Phosphatase: 70 IU/L (ref 39–117)
BUN/Creatinine Ratio: 18 (ref 10–22)
BUN: 20 mg/dL (ref 8–27)
Bilirubin Total: 0.7 mg/dL (ref 0.0–1.2)
CO2: 24 mmol/L (ref 18–29)
Calcium: 9.4 mg/dL (ref 8.6–10.2)
Chloride: 102 mmol/L (ref 97–106)
Creatinine, Ser: 1.12 mg/dL (ref 0.76–1.27)
GFR, EST AFRICAN AMERICAN: 75 mL/min/{1.73_m2} (ref >59)
GFR, EST NON AFRICAN AMERICAN: 65 mL/min/{1.73_m2} (ref >59)
GLUCOSE: 127 mg/dL — AB (ref 65–99)
Globulin, Total: 2.6 g/dL (ref 1.5–4.5)
Potassium: 4.3 mmol/L (ref 3.5–5.2)
Sodium: 142 mmol/L (ref 136–144)
TOTAL PROTEIN: 6.8 g/dL (ref 6.0–8.5)

## 2015-05-23 ENCOUNTER — Telehealth: Payer: Self-pay | Admitting: Unknown Physician Specialty

## 2015-05-23 NOTE — Telephone Encounter (Signed)
Pharmacy has concerns with flomax before they fill it and would like a call back

## 2015-05-24 NOTE — Telephone Encounter (Signed)
Called and pharmacy wanted to know if they should fill flomax because the patent is allergic to terazosin. I asked cheryl and she stated for them not to fill it and she wants the patient to call his urologist for an alternative. So I told the pharmacy to not fill the medication. Then I called the patient and let him know what Malachy Mood said and he stated he would call his urologist.

## 2015-05-29 ENCOUNTER — Encounter: Payer: Self-pay | Admitting: *Deleted

## 2015-05-29 ENCOUNTER — Encounter: Payer: Commercial Managed Care - HMO | Attending: Unknown Physician Specialty | Admitting: *Deleted

## 2015-05-29 VITALS — BP 138/72 | Ht 69.0 in | Wt 241.7 lb

## 2015-05-29 DIAGNOSIS — E119 Type 2 diabetes mellitus without complications: Secondary | ICD-10-CM | POA: Insufficient documentation

## 2015-05-29 DIAGNOSIS — Z794 Long term (current) use of insulin: Secondary | ICD-10-CM

## 2015-05-29 NOTE — Patient Instructions (Addendum)
Check blood sugars 2 x day before breakfast and 2 hrs after supper every day Exercise: Continue walking  for  60-120 minutes 5 days a week Eat 3 meals day,  1-2  snacks a day Space meals 4-6 hours apart Complete 3 Day Food Record and bring to next appt Bring blood sugar records to the next appointment Carry fast acting glucose and a snack at all times

## 2015-05-29 NOTE — Progress Notes (Signed)
Diabetes Self-Management Education  Visit Type: First/Initial  Appt. Start Time: 1415 Appt. End Time: [id number]  05/29/2015  [name], identified by name and date of birth, is a 74 y.o. male with a diagnosis of Diabetes: Type 2.   ASSESSMENT  Blood pressure 138/72, height 5\' 9"  (1.753 m), weight 241 lb 11.2 oz (109.634 kg). Body mass index is 35.68 kg/(m^2).      Diabetes Self-Management Education - 05/29/15 1556    Visit Information   Visit Type First/Initial   Initial Visit   Diabetes Type Type 2   Are you currently following a meal plan? No   Are you taking your medications as prescribed? Yes   Date Diagnosed 10 years ago   Health Coping   How would you rate your overall health? Good   Psychosocial Assessment   Patient Belief/Attitude about Diabetes Motivated to manage diabetes   Self-care barriers None   Self-management support Family;Doctor's office   Patient Concerns Nutrition/Meal planning;Medication;Glycemic Control;Healthy Lifestyle;Weight Control;Problem Solving   Special Needs None   Preferred Learning Style Auditory   Learning Readiness Ready   How often do you need to have someone help you when you read instructions, pamphlets, or other written materials from your doctor or pharmacy? 1 - Never   What is the last grade level you completed in school? [id number]   Complications   Last HgB A1C per patient/outside source 8.4 %  05/15/15   How often do you check your blood sugar? 1-2 times/day   Fasting Blood glucose range (mg/dL) 70-129  Pt reports FBG's 80-100 mg/dL   Postprandial Blood glucose range (mg/dL) 180-200  He reports pp's 195-215 mg/dL   Have you had a dilated eye exam in the past 12 months? Yes   Have you had a dental exam in the past 12 months? No   Are you checking your feet? Yes   How many days per week are you checking your feet? 1   Dietary Intake   Breakfast tenderloin, 2 boiled eggs, 1 piece of wheat bread   Lunch peanut butter and jelly  sandwich with soup, or ham sandwich, salad   Dinner baked chicken or tenderloin, green beans, potatoes, corn   Beverage(s) water, unsweetened tea   Exercise   Exercise Type Light (walking / raking leaves)   How many days per week to you exercise? 5   How many minutes per day do you exercise? 120   Total minutes per week of exercise 600   Patient Education   Previous Diabetes Education Yes (please comment)  when 1st diagnosed - [place]   Disease state  Factors that contribute to the development of diabetes   Nutrition management  Role of diet in the treatment of diabetes and the relationship between the three main macronutrients and blood glucose level;Food label reading, portion sizes and measuring food.   Physical activity and exercise  Role of exercise on diabetes management, blood pressure control and cardiac health.   Medications Reviewed insulin injection, site rotation, insulin storage and needle disposal.;Reviewed patients medication for diabetes, action, purpose, timing of dose and side effects.   Monitoring Purpose and frequency of SMBG.;Identified appropriate SMBG and/or A1C goals.   Acute complications Taught treatment of hypoglycemia - the 15 rule.   Chronic complications Relationship between chronic complications and blood glucose control   Psychosocial adjustment Identified and addressed patients feelings and concerns about diabetes   Individualized Goals (developed by patient)   Reducing Risk Improve blood sugars Decrease medications  Prevent diabetes complications Lose weight Lead a healthier lifestyle Become more fit   Outcomes   Expected Outcomes Demonstrated interest in learning. Expect positive outcomes      Individualized Plan for Diabetes Self-Management Training:   Learning Objective:  Patient will have a greater understanding of diabetes self-management. Patient education plan is to attend individual and/or group sessions per assessed needs and concerns.   Plan:    Patient Instructions  Check blood sugars 2 x day before breakfast and 2 hrs after supper every day Exercise: Continue walking  for  60-120 minutes 5 days a week Eat 3 meals day,  1-2  snacks a day Space meals 4-6 hours apart Complete 3 Day Food Record and bring to next appt Bring blood sugar records to the next appointment Carry fast acting glucose and a snack at all times   Expected Outcomes:  Demonstrated interest in learning. Expect positive outcomes  Education material provided:  General Meal Planning Guidelines Simple Meal Plan 3 Day Food Record Symptoms, causes and treatments of Hypoglycemia  If problems or questions, patient to contact team via:   Scott Lawson, [place], [place], CDE [phone]  Future DSME appointment:  Wednesday June 12, 2015 at 11:00 am with [facility] (dietitian)

## 2015-06-12 ENCOUNTER — Encounter: Payer: Commercial Managed Care - HMO | Admitting: Dietician

## 2015-06-12 ENCOUNTER — Telehealth: Payer: Self-pay | Admitting: Unknown Physician Specialty

## 2015-06-12 VITALS — BP 120/80 | Ht 69.0 in | Wt 243.2 lb

## 2015-06-12 DIAGNOSIS — N400 Enlarged prostate without lower urinary tract symptoms: Secondary | ICD-10-CM

## 2015-06-12 DIAGNOSIS — Z794 Long term (current) use of insulin: Principal | ICD-10-CM

## 2015-06-12 DIAGNOSIS — E119 Type 2 diabetes mellitus without complications: Secondary | ICD-10-CM | POA: Diagnosis not present

## 2015-06-12 NOTE — Telephone Encounter (Signed)
Pt called stated he needs refill on Flomax. Pharm is [facility]. Thanks.

## 2015-06-12 NOTE — Telephone Encounter (Signed)
Called pharmacy about this medication. They stated they still have it on hold because of the patient's allergy to terazosin. Back on 05/23/15, I spoke to the patient about this and told him that Malachy Mood wanted him to call his urologist to find an alternative medication he could take. So I just called the patient again and he stated he called his urologist but said they would not talk to him because he had not been seen in a while so they need a referral.

## 2015-06-12 NOTE — Patient Instructions (Signed)
   Keep measuring food portions to control carb intake.   Control carbohydrate intake to 3-4 servings with each meal.   Continue to stay active druing the day.

## 2015-06-12 NOTE — Telephone Encounter (Signed)
Referral to urology generated.

## 2015-06-12 NOTE — Progress Notes (Signed)
Diabetes Self-Management Education  Visit Type:  Follow-up  Appt. Start Time: 1045 Appt. End Time: [id number]  06/12/2015  [name], identified by name and date of birth, is a 74 y.o. male with a diagnosis of Diabetes:  .   ASSESSMENT  Blood pressure 120/80, height 5\' 9"  (1.753 m), weight 243 lb 3.2 oz (110.315 kg). Body mass index is 35.9 kg/(m^2).       Diabetes Self-Management Education - [id number]    Complications   How often do you check your blood sugar? 1-2 times/day   Fasting Blood glucose range (mg/dL) 70-129   Postprandial Blood glucose range (mg/dL) 130-179;180-200   Have you had a dilated eye exam in the past 12 months? Yes   Have you had a dental exam in the past 12 months? No   Are you checking your feet? Yes   How many days per week are you checking your feet? 1   Exercise   Exercise Type Light (walking / raking leaves)   How many days per week to you exercise? 5   How many minutes per day do you exercise? 150   Total minutes per week of exercise 750   Patient Education   Nutrition management  Role of diet in the treatment of diabetes and the relationship between the three main macronutrients and blood glucose level;Food label reading, portion sizes and measuring food.;Meal options for control of blood glucose level and chronic complications.  1800kcal meal plan for weight loss; importance of balanced meals and portion control   Physical activity and exercise  Role of exercise on diabetes management, blood pressure control and cardiac health.   Monitoring Taught/discussed recording of test results and interpretation of SMBG.   Outcomes   Program Status Completed      Learning Objective:  Patient will have a greater understanding of diabetes self-management. Patient education plan is to attend individual and/or group sessions per assessed needs and concerns.   Plan:   Patient Instructions   Keep measuring food portions to control carb intake.    Control carbohydrate intake to 3-4 servings with each meal.   Continue to stay active druing the day.     Expected Outcomes:  Demonstrated interest in learning. Expect positive outcomes  Education material provided: Plate method meal planner         Planning A Balanced Meal with 1800kcal meal plan         Quick and Healthy meal ideas  If problems or questions, patient to contact team via:  Phone  Future DSME appointment: -  none scheduled, program completed

## 2015-06-13 NOTE — Telephone Encounter (Signed)
Called and let patient know that referral was entered.  

## 2015-06-25 ENCOUNTER — Other Ambulatory Visit: Payer: Self-pay | Admitting: Unknown Physician Specialty

## 2015-06-28 ENCOUNTER — Ambulatory Visit (INDEPENDENT_AMBULATORY_CARE_PROVIDER_SITE_OTHER): Payer: Commercial Managed Care - HMO | Admitting: Urology

## 2015-06-28 VITALS — BP 133/75 | HR 69 | Ht 69.0 in | Wt 240.5 lb

## 2015-06-28 DIAGNOSIS — R972 Elevated prostate specific antigen [PSA]: Secondary | ICD-10-CM | POA: Diagnosis not present

## 2015-06-28 DIAGNOSIS — N400 Enlarged prostate without lower urinary tract symptoms: Secondary | ICD-10-CM

## 2015-06-28 LAB — URINALYSIS, COMPLETE
Bilirubin, UA: NEGATIVE
Glucose, UA: NEGATIVE
Ketones, UA: NEGATIVE
LEUKOCYTES UA: NEGATIVE
Nitrite, UA: NEGATIVE
Protein, UA: NEGATIVE
RBC, UA: NEGATIVE
Specific Gravity, UA: 1.01 (ref 1.005–1.030)
Urobilinogen, Ur: 0.2 mg/dL (ref 0.2–1.0)
pH, UA: 5.5 (ref 5.0–7.5)

## 2015-06-28 LAB — MICROSCOPIC EXAMINATION
Bacteria, UA: NONE SEEN
EPITHELIAL CELLS (NON RENAL): NONE SEEN /HPF (ref 0–10)
RBC, UA: NONE SEEN /hpf (ref 0–?)
WBC, UA: NONE SEEN /hpf (ref 0–?)

## 2015-06-28 NOTE — Progress Notes (Signed)
06/28/2015 1:30 PM   Scott Bruins Nydam Jr. May 11, 1941 [id number] j Referring provider: Kathrine Haddock, NP [place], [place] [address]  Chief Complaint  Patient presents with  . Benign Prostatic Hypertrophy    HPI: 75 year old male who returns today for annual follow-up. He does have a history of elevated PSA in the past. Most recent PSA was 2.9 ng/dL 02/21/2014.  He was followed by [facility] in the past.     He was previously taking finasteride/ flomax for his baseline BPH in the past.  Today, he has no idea which medications he is taking today.  He reports that he is "allergic" to terazosin after having an episode of dizziness 9 months ago.  He has not been taking terazosin or tamsulosin and is doing just fine without it.  He does not think he is taking finasteride anymore either.    He has very few voiding complaints today other than nocturia x 2.  He has a good urinary stream and denies any urinary urgency, frequency, dysuria, or gross hematuria.   PMH: Past Medical History  Diagnosis Date  . Hypertriglyceridemia   . Diabetes mellitus without complication ([place])   . Hypertension   . BPH (benign prostatic hyperplasia)   . Nocturia   . Elevated PSA     Surgical History: Past Surgical History  Procedure Laterality Date  . Appendectomy    . Eye surgery      Home Medications:    Medication List       This list is accurate as of: 06/28/15 11:59 PM.  Always use your most recent med list.               aspirin 81 MG tablet  Take 81 mg by mouth daily.     atorvastatin 40 MG tablet  Commonly known as:  LIPITOR  TAKE 1 TABLET EVERY DAY     fluticasone 50 MCG/ACT nasal spray  Commonly known as:  FLONASE  Place 2 sprays into both nostrils daily as needed.     glipiZIDE 5 MG tablet  Commonly known as:  GLUCOTROL  TAKE 1 TABLET TWICE DAILY     glucose blood test strip  1 each by Other route as needed for other. Use as instructed     insulin glargine 100  UNIT/ML injection  Commonly known as:  LANTUS  Inject 35 Units into the skin at bedtime.     lisinopril 20 MG tablet  Commonly known as:  PRINIVIL,ZESTRIL  Take 20 mg by mouth daily.     losartan 50 MG tablet  Commonly known as:  COZAAR  TAKE 1 TABLET ONE TIME DAILY     metFORMIN 500 MG tablet  Commonly known as:  GLUCOPHAGE  TAKE 1 TABLET (500 MG TOTAL) BY MOUTH 2 (TWO) TIMES DAILY WITH A MEAL.     omeprazole 20 MG capsule  Commonly known as:  PRILOSEC  Take 20 mg by mouth daily.        Allergies:  Allergies  Allergen Reactions  . Terazosin     Hypotension  . Trazodone And Nefazodone     Hypotension    Family History: Family History  Problem Relation Age of Onset  . Cancer Mother     lung  . Heart disease Father   . Diabetes Brother     Social History:  reports that he has never smoked. He has never used smokeless tobacco. He reports that he does not drink alcohol or use illicit  drugs.  ROS: UROLOGY Frequent Urination?: No Hard to postpone urination?: No Burning/pain with urination?: No Get up at night to urinate?: No Leakage of urine?: No Urine stream starts and stops?: No Trouble starting stream?: No Do you have to strain to urinate?: No Blood in urine?: No Urinary tract infection?: No Sexually transmitted disease?: No Injury to kidneys or bladder?: No Painful intercourse?: No Weak stream?: No Erection problems?: No Penile pain?: No  Gastrointestinal Nausea?: No Vomiting?: No Indigestion/heartburn?: No Diarrhea?: No Constipation?: No  Constitutional Fever: No Night sweats?: No Weight loss?: No Fatigue?: No  Skin Skin rash/lesions?: No Itching?: No  Eyes Blurred vision?: No Double vision?: No  Ears/Nose/Throat Sore throat?: No Sinus problems?: No  Hematologic/Lymphatic Swollen glands?: No Easy bruising?: No  Cardiovascular Leg swelling?: No Chest pain?: No  Respiratory Cough?: No Shortness of breath?:  No  Endocrine Excessive thirst?: No  Musculoskeletal Back pain?: No Joint pain?: No  Neurological Headaches?: No Dizziness?: No  Psychologic Depression?: No Anxiety?: No  Physical Exam: BP 133/75 mmHg  Pulse 69  Ht 5\' 9"  (1.753 m)  Wt 240 lb 8 oz (109.09 kg)  BMI 35.50 kg/m2  Constitutional:  Alert and oriented, No acute distress. HEENT: [place] AT, moist mucus membranes.  Trachea midline, no masses. Cardiovascular: No clubbing, cyanosis, or edema. Respiratory: Normal respiratory effort, no increased work of breathing. GI: Abdomen is soft, nontender, nondistended, no abdominal masses GU: No CVA tenderness.  Rectal exam: Normal external sphincter tone, 50+ cc prostate, nontender, no nodules.   Skin: No rashes, bruises or suspicious lesions.. Neurologic: Grossly intact, no focal deficits, moving all 4 extremities. Psychiatric: Normal mood and affect.  Laboratory Data: Lab Results  Component Value Date   WBC 8.4 11/02/2014   HGB 13.3 11/02/2014   HCT 40.6 11/02/2014   MCV 90.8 11/02/2014   PLT 220 11/02/2014    Lab Results  Component Value Date   CREATININE 1.12 05/15/2015   Lab Results  Component Value Date   HGBA1C 9.7* 07/16/2014    Urinalysis Results for orders placed or performed in visit on 06/28/15  Microscopic Examination  Result Value Ref Range   WBC, UA None seen 0 -  5 /hpf   RBC, UA None seen 0 -  2 /hpf   Epithelial Cells (non renal) None seen 0 - 10 /hpf   Bacteria, UA None seen None seen/Few  Urinalysis, Complete  Result Value Ref Range   Specific Gravity, UA 1.010 1.005 - 1.030   pH, UA 5.5 5.0 - 7.5   Color, UA Yellow Yellow   Appearance Ur Clear Clear   Leukocytes, UA Negative Negative   Protein, UA Negative Negative/Trace   Glucose, UA Negative Negative   Ketones, UA Negative Negative   RBC, UA Negative Negative   Bilirubin, UA Negative Negative   Urobilinogen, Ur 0.2 0.2 - 1.0 mg/dL   Nitrite, UA Negative Negative   Microscopic  Examination See below:   PSA  Result Value Ref Range   Prostate Specific Ag, Serum 1.6 0.0 - 4.0 ng/mL    Pertinent Imaging: n/a  Assessment & Plan:   1. BPH (benign prostatic hyperplasia) Stop all BPH meds.  Very few urinary symptoms today on presumably no BPH medications. No indication for further therapy given lack of bothersome symptoms. RTC as needed - Urinalysis, Complete  2. Elevated PSA History of previously elevated PSA. PSA today is within normal limits, 1.6 and unremarkable rectal exam. Given his age, no further prostate cancer screening is  recommended. - PSA   Return if symptoms worsen or fail to improve.  Hollice Espy, MD  [facility] [address] [place], [place] [address] [phone]

## 2015-06-29 ENCOUNTER — Encounter: Payer: Self-pay | Admitting: Urology

## 2015-06-29 LAB — PSA: Prostate Specific Ag, Serum: 1.6 ng/mL (ref 0.0–4.0)

## 2015-07-01 ENCOUNTER — Telehealth: Payer: Self-pay

## 2015-07-01 NOTE — Telephone Encounter (Signed)
Spoke with pt in reference to PSA results. Pt voiced understanding.  

## 2015-07-01 NOTE — Telephone Encounter (Signed)
-----   Message from Hollice Espy, MD sent at 06/29/2015  1:37 PM EST ----- Please let this patient noted that his PSA is excellent. Given his age, I would recommend no further prostate cancer screening.  Hollice Espy, MD

## 2015-07-04 ENCOUNTER — Other Ambulatory Visit: Payer: Self-pay | Admitting: Unknown Physician Specialty

## 2015-07-12 ENCOUNTER — Other Ambulatory Visit: Payer: Self-pay | Admitting: Unknown Physician Specialty

## 2015-08-13 ENCOUNTER — Encounter: Payer: Self-pay | Admitting: Emergency Medicine

## 2015-08-13 ENCOUNTER — Other Ambulatory Visit: Payer: Self-pay | Admitting: Unknown Physician Specialty

## 2015-08-13 ENCOUNTER — Ambulatory Visit (INDEPENDENT_AMBULATORY_CARE_PROVIDER_SITE_OTHER): Payer: Commercial Managed Care - HMO | Admitting: Unknown Physician Specialty

## 2015-08-13 ENCOUNTER — Encounter: Payer: Self-pay | Admitting: Unknown Physician Specialty

## 2015-08-13 DIAGNOSIS — Z7951 Long term (current) use of inhaled steroids: Secondary | ICD-10-CM | POA: Diagnosis not present

## 2015-08-13 DIAGNOSIS — Z794 Long term (current) use of insulin: Secondary | ICD-10-CM

## 2015-08-13 DIAGNOSIS — B349 Viral infection, unspecified: Secondary | ICD-10-CM | POA: Insufficient documentation

## 2015-08-13 DIAGNOSIS — E785 Hyperlipidemia, unspecified: Secondary | ICD-10-CM | POA: Insufficient documentation

## 2015-08-13 DIAGNOSIS — R748 Abnormal levels of other serum enzymes: Secondary | ICD-10-CM | POA: Diagnosis not present

## 2015-08-13 DIAGNOSIS — I9589 Other hypotension: Secondary | ICD-10-CM

## 2015-08-13 DIAGNOSIS — K219 Gastro-esophageal reflux disease without esophagitis: Secondary | ICD-10-CM | POA: Diagnosis not present

## 2015-08-13 DIAGNOSIS — E860 Dehydration: Secondary | ICD-10-CM | POA: Diagnosis not present

## 2015-08-13 DIAGNOSIS — K529 Noninfective gastroenteritis and colitis, unspecified: Secondary | ICD-10-CM | POA: Diagnosis not present

## 2015-08-13 DIAGNOSIS — I951 Orthostatic hypotension: Secondary | ICD-10-CM | POA: Diagnosis not present

## 2015-08-13 DIAGNOSIS — R05 Cough: Secondary | ICD-10-CM | POA: Diagnosis not present

## 2015-08-13 DIAGNOSIS — I129 Hypertensive chronic kidney disease with stage 1 through stage 4 chronic kidney disease, or unspecified chronic kidney disease: Secondary | ICD-10-CM | POA: Diagnosis not present

## 2015-08-13 DIAGNOSIS — N183 Chronic kidney disease, stage 3 (moderate): Secondary | ICD-10-CM

## 2015-08-13 DIAGNOSIS — J111 Influenza due to unidentified influenza virus with other respiratory manifestations: Secondary | ICD-10-CM | POA: Diagnosis not present

## 2015-08-13 DIAGNOSIS — Z7982 Long term (current) use of aspirin: Secondary | ICD-10-CM | POA: Insufficient documentation

## 2015-08-13 DIAGNOSIS — N182 Chronic kidney disease, stage 2 (mild): Secondary | ICD-10-CM | POA: Diagnosis not present

## 2015-08-13 DIAGNOSIS — E1122 Type 2 diabetes mellitus with diabetic chronic kidney disease: Principal | ICD-10-CM | POA: Insufficient documentation

## 2015-08-13 DIAGNOSIS — Z79899 Other long term (current) drug therapy: Secondary | ICD-10-CM | POA: Diagnosis not present

## 2015-08-13 DIAGNOSIS — E1165 Type 2 diabetes mellitus with hyperglycemia: Secondary | ICD-10-CM | POA: Insufficient documentation

## 2015-08-13 DIAGNOSIS — E781 Pure hyperglyceridemia: Secondary | ICD-10-CM | POA: Diagnosis not present

## 2015-08-13 DIAGNOSIS — N179 Acute kidney failure, unspecified: Secondary | ICD-10-CM | POA: Diagnosis not present

## 2015-08-13 DIAGNOSIS — R52 Pain, unspecified: Secondary | ICD-10-CM | POA: Insufficient documentation

## 2015-08-13 DIAGNOSIS — IMO0002 Reserved for concepts with insufficient information to code with codable children: Secondary | ICD-10-CM

## 2015-08-13 DIAGNOSIS — Z9049 Acquired absence of other specified parts of digestive tract: Secondary | ICD-10-CM | POA: Diagnosis not present

## 2015-08-13 DIAGNOSIS — I10 Essential (primary) hypertension: Secondary | ICD-10-CM | POA: Diagnosis not present

## 2015-08-13 DIAGNOSIS — N400 Enlarged prostate without lower urinary tract symptoms: Secondary | ICD-10-CM | POA: Insufficient documentation

## 2015-08-13 DIAGNOSIS — N189 Chronic kidney disease, unspecified: Secondary | ICD-10-CM | POA: Diagnosis not present

## 2015-08-13 DIAGNOSIS — Z9889 Other specified postprocedural states: Secondary | ICD-10-CM | POA: Diagnosis not present

## 2015-08-13 DIAGNOSIS — I1310 Hypertensive heart and chronic kidney disease without heart failure, with stage 1 through stage 4 chronic kidney disease, or unspecified chronic kidney disease: Secondary | ICD-10-CM | POA: Insufficient documentation

## 2015-08-13 LAB — MICROSCOPIC EXAMINATION

## 2015-08-13 LAB — CBC WITH DIFFERENTIAL/PLATELET
HEMATOCRIT: 41.6 % (ref 37.5–51.0)
HEMOGLOBIN: 14.3 g/dL (ref 12.6–17.7)
LYMPHS ABS: 1.6 10*3/uL (ref 0.7–3.1)
Lymphs: 27 %
MCH: 31.2 pg (ref 26.6–33.0)
MCHC: 34.4 g/dL (ref 31.5–35.7)
MCV: 91 fL (ref 79–97)
MID (ABSOLUTE): 1 10*3/uL (ref 0.1–1.6)
MID: 18 %
NEUTROS ABS: 3.2 10*3/uL (ref 1.4–7.0)
NEUTROS PCT: 55 %
Platelets: 229 10*3/uL (ref 150–379)
RBC: 4.59 x10E6/uL (ref 4.14–5.80)
RDW: 13.1 % (ref 12.3–15.4)
WBC: 5.8 10*3/uL (ref 3.4–10.8)

## 2015-08-13 LAB — UA/M W/RFLX CULTURE, ROUTINE
BILIRUBIN UA: NEGATIVE
KETONES UA: NEGATIVE
LEUKOCYTES UA: NEGATIVE
NITRITE UA: NEGATIVE
PH UA: 5 (ref 5.0–7.5)
Protein, UA: NEGATIVE
SPEC GRAV UA: 1.02 (ref 1.005–1.030)
UUROB: 0.2 mg/dL (ref 0.2–1.0)

## 2015-08-13 LAB — MICROALBUMIN, URINE WAIVED
CREATININE, URINE WAIVED: 200 mg/dL (ref 10–300)
MICROALB, UR WAIVED: 80 mg/L — AB (ref 0–19)

## 2015-08-13 LAB — LIPID PANEL PICCOLO, WAIVED
CHOL/HDL RATIO PICCOLO,WAIVE: 3.5 mg/dL
Cholesterol Piccolo, Waived: 141 mg/dL (ref <200)
HDL Chol Piccolo, Waived: 40 mg/dL — ABNORMAL LOW (ref >59)
LDL Chol Calc Piccolo Waived: 77 mg/dL (ref <100)
TRIGLYCERIDES PICCOLO,WAIVED: 122 mg/dL (ref <150)
VLDL CHOL CALC PICCOLO,WAIVE: 24 mg/dL (ref <30)

## 2015-08-13 LAB — BAYER DCA HB A1C WAIVED: HB A1C: 8 % — AB (ref <7.0)

## 2015-08-13 NOTE — Assessment & Plan Note (Signed)
Hgb A1C is 8.  Will not make any changes at this time until diarrhea is better

## 2015-08-13 NOTE — ED Notes (Signed)
Pt arrived to the  ED accompanied by his daughter for complaints of generalized body aches and cough. Pt was see by his primary today and blood work was done. Pt states that family members have been diagnosed with the flu. Pt is AOx4 in no apparent distress.

## 2015-08-13 NOTE — Progress Notes (Signed)
BP 86/55 mmHg  Pulse 62  Temp(Src) 97.6 F (36.4 C)  Ht 5' 7.2" (1.707 m)  Wt 219 lb 3.2 oz (99.428 kg)  BMI 34.12 kg/m2  SpO2 99%   Subjective:    Patient ID: Scott Roof., male    DOB: [birth year], 75 y.o.   MRN: [id number]  HPI: Scott Fard. is a 75 y.o. male  Chief Complaint  Patient presents with  . Diabetes    faxing diabetic eye exam report to [facility]  . Hyperlipidemia  . Hypertension   Pt states he has been sick with diarrhea about 4 days ago.  Pt states "everything turns to water."  States his blood sugar is "up and down" from 260-79.  States he is feeling dizzy for the last 4 days.  Started drinking fluids and felt better.  States he is going 4-5 times a day  Diabetes:  Taking 35 units of Lantus Using medications without difficulties  Hypertension:  Using medications without difficulty Average home BPs:Not checking No chest pain with exertion or shortness of breath No Edema  Elevated Cholesterol: Using medications without problems: No Muscle aches   Relevant past medical, surgical, family and social history reviewed and updated as indicated. Interim medical history since our last visit reviewed. Allergies and medications reviewed and updated.  Review of Systems  Per HPI unless specifically indicated above     Objective:    BP 86/55 mmHg  Pulse 62  Temp(Src) 97.6 F (36.4 C)  Ht 5' 7.2" (1.707 m)  Wt 219 lb 3.2 oz (99.428 kg)  BMI 34.12 kg/m2  SpO2 99%  Wt Readings from Last 3 Encounters:  08/13/15 219 lb 3.2 oz (99.428 kg)  06/28/15 240 lb 8 oz (109.09 kg)  06/12/15 243 lb 3.2 oz (110.315 kg)    Physical Exam  Constitutional: He is oriented to person, place, and time. He appears well-developed and well-nourished. No distress.  HENT:  Head: Normocephalic and atraumatic.  Eyes: Conjunctivae and lids are normal. Right eye exhibits no discharge. Left eye exhibits no discharge. No scleral icterus.   Neck: Normal range of motion. Neck supple. No JVD present. Carotid bruit is not present.  Cardiovascular: Normal rate, regular rhythm and normal heart sounds.   Pulmonary/Chest: Effort normal and breath sounds normal. No respiratory distress.  Abdominal: Normal appearance. There is no splenomegaly or hepatomegaly.  Musculoskeletal: Normal range of motion.  Neurological: He is alert and oriented to person, place, and time.  Skin: Skin is warm, dry and intact. No rash noted. No pallor.  Psychiatric: He has a normal mood and affect. His behavior is normal. Judgment and thought content normal.   CBC is normal with WBC of 5.8  Results for orders placed or performed in visit on 06/28/15  Microscopic Examination  Result Value Ref Range   WBC, UA None seen 0 -  5 /hpf   RBC, UA None seen 0 -  2 /hpf   Epithelial Cells (non renal) None seen 0 - 10 /hpf   Bacteria, UA None seen None seen/Few  Urinalysis, Complete  Result Value Ref Range   Specific Gravity, UA 1.010 1.005 - 1.030   pH, UA 5.5 5.0 - 7.5   Color, UA Yellow Yellow   Appearance Ur Clear Clear   Leukocytes, UA Negative Negative   Protein, UA Negative Negative/Trace   Glucose, UA Negative Negative   Ketones, UA Negative Negative   RBC, UA Negative Negative   Bilirubin, UA  Negative Negative   Urobilinogen, Ur 0.2 0.2 - 1.0 mg/dL   Nitrite, UA Negative Negative   Microscopic Examination See below:   PSA  Result Value Ref Range   Prostate Specific Ag, Serum 1.6 0.0 - 4.0 ng/mL      Assessment & Plan:   Problem List Items Addressed This Visit      Unprioritized   Hyperlipidemia    Stable, continue present medications.        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  Will not make any changes at this time until diarrhea is better      Relevant Orders   Bayer DCA Hb A1c Waived   Microalbumin, Urine Waived    Comprehensive metabolic panel    Other Visit Diagnoses    Gastroenteritis        suspect related to virus.  No recent antibiotics.  To the ER if not improving and if dizzy.  Continue lots of fluids.  BRAT diet.  Recheck in 3 days.      Relevant Orders    CBC With Differential/Platelet    Dehydration        Relevant Orders    UA/M w/rflx Culture, Routine    Other specified hypotension        Not orthostatic.  Probably related to diarrhea.  Seems to be improving.  Stop Metformin and Losartan until he sees me in 3 days       To the ER for worsening or persistent symptoms  Follow up plan: Return in about 3 days (around 08/16/2015).

## 2015-08-13 NOTE — Patient Instructions (Addendum)
For diarrhea: Keep up fluids To the ER if worse or becomes dizzy or faint. BRAT diet (bannanas rice applesauce and toast) Recheck in 3 days Stop Metformin and Losartan

## 2015-08-13 NOTE — Assessment & Plan Note (Signed)
Stable, continue present medications.   

## 2015-08-14 ENCOUNTER — Emergency Department: Payer: Commercial Managed Care - HMO

## 2015-08-14 ENCOUNTER — Observation Stay
Admission: EM | Admit: 2015-08-14 | Discharge: 2015-08-14 | Disposition: A | Discharge status: Home / Self Care | Payer: Commercial Managed Care - HMO | Attending: Specialist | Admitting: Specialist

## 2015-08-14 DIAGNOSIS — K219 Gastro-esophageal reflux disease without esophagitis: Secondary | ICD-10-CM | POA: Diagnosis present

## 2015-08-14 DIAGNOSIS — N189 Chronic kidney disease, unspecified: Secondary | ICD-10-CM | POA: Diagnosis present

## 2015-08-14 DIAGNOSIS — E860 Dehydration: Secondary | ICD-10-CM | POA: Diagnosis not present

## 2015-08-14 DIAGNOSIS — E782 Mixed hyperlipidemia: Secondary | ICD-10-CM | POA: Diagnosis present

## 2015-08-14 DIAGNOSIS — E1165 Type 2 diabetes mellitus with hyperglycemia: Secondary | ICD-10-CM

## 2015-08-14 DIAGNOSIS — R7989 Other specified abnormal findings of blood chemistry: Secondary | ICD-10-CM | POA: Diagnosis not present

## 2015-08-14 DIAGNOSIS — Z794 Long term (current) use of insulin: Secondary | ICD-10-CM

## 2015-08-14 DIAGNOSIS — E785 Hyperlipidemia, unspecified: Secondary | ICD-10-CM

## 2015-08-14 DIAGNOSIS — N179 Acute kidney failure, unspecified: Secondary | ICD-10-CM | POA: Diagnosis present

## 2015-08-14 DIAGNOSIS — I951 Orthostatic hypotension: Secondary | ICD-10-CM

## 2015-08-14 DIAGNOSIS — R778 Other specified abnormalities of plasma proteins: Secondary | ICD-10-CM | POA: Diagnosis present

## 2015-08-14 DIAGNOSIS — E1122 Type 2 diabetes mellitus with diabetic chronic kidney disease: Secondary | ICD-10-CM | POA: Diagnosis present

## 2015-08-14 DIAGNOSIS — N183 Chronic kidney disease, stage 3 unspecified: Secondary | ICD-10-CM | POA: Diagnosis present

## 2015-08-14 DIAGNOSIS — IMO0002 Reserved for concepts with insufficient information to code with codable children: Secondary | ICD-10-CM | POA: Diagnosis present

## 2015-08-14 DIAGNOSIS — J111 Influenza due to unidentified influenza virus with other respiratory manifestations: Secondary | ICD-10-CM | POA: Diagnosis present

## 2015-08-14 DIAGNOSIS — B349 Viral infection, unspecified: Secondary | ICD-10-CM

## 2015-08-14 DIAGNOSIS — E1169 Type 2 diabetes mellitus with other specified complication: Secondary | ICD-10-CM | POA: Diagnosis present

## 2015-08-14 DIAGNOSIS — R05 Cough: Secondary | ICD-10-CM | POA: Diagnosis not present

## 2015-08-14 DIAGNOSIS — I1310 Hypertensive heart and chronic kidney disease without heart failure, with stage 1 through stage 4 chronic kidney disease, or unspecified chronic kidney disease: Secondary | ICD-10-CM | POA: Diagnosis present

## 2015-08-14 DIAGNOSIS — N182 Chronic kidney disease, stage 2 (mild): Secondary | ICD-10-CM

## 2015-08-14 DIAGNOSIS — N1831 Chronic kidney disease, stage 3a: Secondary | ICD-10-CM | POA: Diagnosis present

## 2015-08-14 LAB — URINALYSIS COMPLETE WITH MICROSCOPIC (ARMC ONLY)
BILIRUBIN URINE: NEGATIVE
Bacteria, UA: NONE SEEN
Glucose, UA: 50 mg/dL — AB
Ketones, ur: NEGATIVE mg/dL
Leukocytes, UA: NEGATIVE
Nitrite: NEGATIVE
PH: 5 (ref 5.0–8.0)
Protein, ur: NEGATIVE mg/dL
Specific Gravity, Urine: 1.015 (ref 1.005–1.030)

## 2015-08-14 LAB — COMPREHENSIVE METABOLIC PANEL
A/G RATIO: 1.7 (ref 1.1–2.5)
ALBUMIN: 4.3 g/dL (ref 3.5–4.8)
ALK PHOS: 73 IU/L (ref 39–117)
ALT: 17 IU/L (ref 0–44)
ALT: 19 U/L (ref 17–63)
AST: 25 IU/L (ref 0–40)
AST: 25 U/L (ref 15–41)
Albumin: 3.8 g/dL (ref 3.5–5.0)
Alkaline Phosphatase: 67 U/L (ref 38–126)
Anion gap: 11 (ref 5–15)
BILIRUBIN TOTAL: 0.3 mg/dL (ref 0.0–1.2)
BUN / CREAT RATIO: 13 (ref 10–22)
BUN: 26 mg/dL (ref 8–27)
BUN: 28 mg/dL — ABNORMAL HIGH (ref 6–20)
CHLORIDE: 100 mmol/L (ref 96–106)
CO2: 20 mmol/L (ref 18–29)
CO2: 20 mmol/L — ABNORMAL LOW (ref 22–32)
Calcium: 9 mg/dL (ref 8.6–10.2)
Calcium: 9.1 mg/dL (ref 8.9–10.3)
Chloride: 104 mmol/L (ref 101–111)
Creatinine, Ser: 1.99 mg/dL — ABNORMAL HIGH (ref 0.61–1.24)
Creatinine, Ser: 2.06 mg/dL — ABNORMAL HIGH (ref 0.76–1.27)
GFR calc Af Amer: 36 mL/min — ABNORMAL LOW (ref >60)
GFR calc non Af Amer: 31 mL/min — ABNORMAL LOW (ref >60)
GFR calc non Af Amer: 31 mL/min/{1.73_m2} — ABNORMAL LOW (ref >59)
GFR, EST AFRICAN AMERICAN: 36 mL/min/{1.73_m2} — AB (ref >59)
GLUCOSE: 201 mg/dL — AB (ref 65–99)
Globulin, Total: 2.5 g/dL (ref 1.5–4.5)
Glucose, Bld: 176 mg/dL — ABNORMAL HIGH (ref 65–99)
POTASSIUM: 4.8 mmol/L (ref 3.5–5.2)
Potassium: 4.1 mmol/L (ref 3.5–5.1)
Sodium: 135 mmol/L (ref 135–145)
Sodium: 140 mmol/L (ref 134–144)
Total Bilirubin: 0.6 mg/dL (ref 0.3–1.2)
Total Protein: 6.8 g/dL (ref 6.0–8.5)
Total Protein: 7.6 g/dL (ref 6.5–8.1)

## 2015-08-14 LAB — CBC
HCT: 38.2 % — ABNORMAL LOW (ref 40.0–52.0)
HCT: 41 % (ref 40.0–52.0)
Hemoglobin: 12.7 g/dL — ABNORMAL LOW (ref 13.0–18.0)
Hemoglobin: 13.7 g/dL (ref 13.0–18.0)
MCH: 29.4 pg (ref 26.0–34.0)
MCH: 29.6 pg (ref 26.0–34.0)
MCHC: 33.2 g/dL (ref 32.0–36.0)
MCHC: 33.3 g/dL (ref 32.0–36.0)
MCV: 88.8 fL (ref 80.0–100.0)
MCV: 89 fL (ref 80.0–100.0)
PLATELETS: 191 10*3/uL (ref 150–440)
Platelets: 219 10*3/uL (ref 150–440)
RBC: 4.31 MIL/uL — ABNORMAL LOW (ref 4.40–5.90)
RBC: 4.61 MIL/uL (ref 4.40–5.90)
RDW: 13 % (ref 11.5–14.5)
RDW: 13 % (ref 11.5–14.5)
WBC: 4.8 10*3/uL (ref 3.8–10.6)
WBC: 5.3 10*3/uL (ref 3.8–10.6)

## 2015-08-14 LAB — GLUCOSE, CAPILLARY
Glucose-Capillary: 193 mg/dL — ABNORMAL HIGH (ref 65–99)
Glucose-Capillary: 229 mg/dL — ABNORMAL HIGH (ref 65–99)

## 2015-08-14 LAB — HEMOGLOBIN A1C: Hgb A1c MFr Bld: 8.1 % — ABNORMAL HIGH (ref 4.0–6.0)

## 2015-08-14 LAB — LACTIC ACID, PLASMA
Lactic Acid, Venous: 1 mmol/L (ref 0.5–2.0)
Lactic Acid, Venous: 1.2 mmol/L (ref 0.5–2.0)

## 2015-08-14 LAB — RAPID INFLUENZA A&B ANTIGENS
Influenza A (ARMC): NOT DETECTED — AB
Influenza B (ARMC): NOT DETECTED — AB

## 2015-08-14 LAB — BASIC METABOLIC PANEL
Anion gap: 9 (ref 5–15)
BUN: 25 mg/dL — AB (ref 6–20)
CALCIUM: 8.2 mg/dL — AB (ref 8.9–10.3)
CHLORIDE: 110 mmol/L (ref 101–111)
CO2: 19 mmol/L — ABNORMAL LOW (ref 22–32)
CREATININE: 1.59 mg/dL — AB (ref 0.61–1.24)
GFR calc Af Amer: 48 mL/min — ABNORMAL LOW (ref >60)
GFR calc non Af Amer: 41 mL/min — ABNORMAL LOW (ref >60)
Glucose, Bld: 218 mg/dL — ABNORMAL HIGH (ref 65–99)
Potassium: 4.7 mmol/L (ref 3.5–5.1)
SODIUM: 138 mmol/L (ref 135–145)

## 2015-08-14 LAB — TROPONIN I
Troponin I: 0.06 ng/mL — ABNORMAL HIGH (ref <0.031)
Troponin I: <0.03 ng/mL (ref <0.031)

## 2015-08-14 MED ORDER — PANTOPRAZOLE SODIUM 40 MG PO TBEC
40.0000 mg | DELAYED_RELEASE_TABLET | Freq: Every day | ORAL | Status: DC
  Administered 2015-08-14: 40 mg via ORAL
  Filled 2015-08-14: qty 1

## 2015-08-14 MED ORDER — INSULIN ASPART 100 UNIT/ML ~~LOC~~ SOLN
0.0000 [IU] | Freq: Three times a day (TID) | SUBCUTANEOUS | Status: DC
  Administered 2015-08-14: 2 [IU] via SUBCUTANEOUS
  Administered 2015-08-14: 3 [IU] via SUBCUTANEOUS
  Filled 2015-08-14: qty 2
  Filled 2015-08-14: qty 3

## 2015-08-14 MED ORDER — ASPIRIN EC 81 MG PO TBEC
81.0000 mg | DELAYED_RELEASE_TABLET | Freq: Every day | ORAL | Status: DC
  Administered 2015-08-14: 81 mg via ORAL
  Filled 2015-08-14: qty 1

## 2015-08-14 MED ORDER — ACETAMINOPHEN 650 MG RE SUPP: 650.0000 mg | Freq: Four times a day (QID) | RECTAL | Status: DC | PRN

## 2015-08-14 MED ORDER — FINASTERIDE 5 MG PO TABS
5.0000 mg | ORAL_TABLET | Freq: Every day | ORAL | Status: DC
  Administered 2015-08-14: 5 mg via ORAL
  Filled 2015-08-14: qty 1

## 2015-08-14 MED ORDER — ONDANSETRON HCL 4 MG/2ML IJ SOLN: 4.0000 mg | Freq: Four times a day (QID) | INTRAMUSCULAR | Status: DC | PRN

## 2015-08-14 MED ORDER — SODIUM CHLORIDE 0.9 % IV BOLUS (SEPSIS)
1000.0000 mL | Freq: Once | INTRAVENOUS | Status: AC
  Administered 2015-08-14: 1000 mL via INTRAVENOUS

## 2015-08-14 MED ORDER — SODIUM CHLORIDE 0.9 % IV SOLN
INTRAVENOUS | Status: AC
  Administered 2015-08-14: 07:00:00 via INTRAVENOUS

## 2015-08-14 MED ORDER — HEPARIN SODIUM (PORCINE) 5000 UNIT/ML IJ SOLN
5000.0000 [IU] | Freq: Three times a day (TID) | INTRAMUSCULAR | Status: DC
  Administered 2015-08-14: 5000 [IU] via SUBCUTANEOUS
  Filled 2015-08-14: qty 1

## 2015-08-14 MED ORDER — ONDANSETRON HCL 4 MG PO TABS: 4.0000 mg | ORAL_TABLET | Freq: Four times a day (QID) | ORAL | Status: DC | PRN

## 2015-08-14 MED ORDER — ATORVASTATIN CALCIUM 20 MG PO TABS
40.0000 mg | ORAL_TABLET | Freq: Every day | ORAL | Status: DC
  Administered 2015-08-14: 40 mg via ORAL
  Filled 2015-08-14: qty 2

## 2015-08-14 MED ORDER — ACETAMINOPHEN 325 MG PO TABS: 650.0000 mg | ORAL_TABLET | Freq: Four times a day (QID) | ORAL | Status: DC | PRN

## 2015-08-14 NOTE — ED Provider Notes (Signed)
[facility] Emergency Department Provider Note  ____________________________________________  Time seen: Approximately 0004 AM  I have reviewed the triage vital signs and the nursing notes.   HISTORY  Chief Complaint Generalized Body Aches and Cough    HPI Avary Goode. is a 75 y.o. male who comes into the hospital today with the complaint of feeling rough. The patient's family reports that his breathing is off, his fingers are tingling and he's having some discomfort in his chest with some congestion. The patient reports that his bones knees back and arms are hurting. The patient's daughter and his grandchild were also diagnosed with the flu today and he stated them last week. The patient saw his doctor today and was told that his blood pressure was low and was given paperwork to bring in with him if he needed. He's also had some chills it's been coming and going. The patient's daughter reports that she thought he was breathing funny and that he had some cough and runny nose. He's had no nausea or vomiting but he has had some diarrhea and he has had some heartburn. Given that the patient was not feeling well the family decided to bring him into the hospital for evaluation.   Past Medical History  Diagnosis Date  . Hypertriglyceridemia   . Diabetes mellitus without complication ([place])   . Hypertension   . BPH (benign prostatic hyperplasia)   . Nocturia   . Elevated PSA     Patient Active Problem List   Diagnosis Date Noted  . Dehydration 08/14/2015  . Flu syndrome 08/14/2015  . AKI (acute kidney injury) ([place]) 08/14/2015  . Elevated troponin 08/14/2015  . 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 Essential hypertension, benign 08/13/2015  . 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 Hyperlipidemia 02/11/2015  . BPH (benign prostatic hyperplasia) 02/11/2015  . Type 2 diabetes mellitus with  chronic kidney disease ([place]) 02/11/2015  . Hypertensive CKD (chronic kidney disease) 02/11/2015    Past Surgical History  Procedure Laterality Date  . Appendectomy    . Eye surgery      Current Outpatient Rx  Name  Route  Sig  Dispense  Refill  . aspirin 81 MG tablet   Oral   Take 81 mg by mouth daily.         Marland Kitchen atorvastatin (LIPITOR) 40 MG tablet      TAKE 1 TABLET EVERY DAY   90 tablet   3   . finasteride (PROSCAR) 5 MG tablet      TAKE 1 TABLET EVERY DAY   90 tablet   1   . fluticasone (FLONASE) 50 MCG/ACT nasal spray   Each Nare   Place 2 sprays into both nostrils daily as needed.          Marland Kitchen glipiZIDE (GLUCOTROL) 5 MG tablet      TAKE 1 TABLET TWICE DAILY   180 tablet   3   . insulin glargine (LANTUS) 100 UNIT/ML injection   Subcutaneous   Inject 35 Units into the skin at bedtime.         . metFORMIN (GLUCOPHAGE) 500 MG tablet      TAKE 1 TABLET (500 MG TOTAL) BY MOUTH 2 (TWO) TIMES DAILY WITH A MEAL.   180 tablet   3   . omeprazole (PRILOSEC) 20 MG capsule      TAKE ONE CAPSULE BY MOUTH EVERY DAY   30 capsule  0   . glucose blood test strip   Other   1 each by Other route as needed for other. Use as instructed         . lisinopril (PRINIVIL,ZESTRIL) 20 MG tablet      TAKE 1 TABLET EVERY DAY Patient not taking: Reported on 08/14/2015   90 tablet   1   . losartan (COZAAR) 50 MG tablet      TAKE 1 TABLET ONE TIME DAILY Patient not taking: Reported on 08/14/2015   90 tablet   0     Allergies Terazosin and Trazodone and nefazodone  Family History  Problem Relation Age of Onset  . Cancer Mother     lung  . Heart disease Father   . Diabetes Brother     Social History Social History  Substance Use Topics  . Smoking status: Never Smoker   . Smokeless tobacco: Never Used  . Alcohol Use: No    Review of Systems Constitutional: No fever/chills Eyes: No visual changes. ENT: No sore throat. Cardiovascular:  chest  pain. Respiratory: Cough and shortness of breath. Gastrointestinal: Diarrhea with no abdominal pain or vomiting Genitourinary: Negative for dysuria. Musculoskeletal: Body aches, joint pains Skin: Negative for rash. Neurological: Negative for headaches, focal weakness or numbness.  10-point ROS otherwise negative.  ____________________________________________   PHYSICAL EXAM:  VITAL SIGNS: ED Triage Vitals  Enc Vitals Group     BP 08/13/15 2351 95/44 mmHg     Pulse Rate 08/13/15 2351 66     Resp 08/13/15 2351 18     Temp 08/13/15 2351 97.9 F (36.6 C)     Temp Source 08/14/15 0008 Oral     SpO2 08/13/15 2351 96 %     Weight 08/13/15 2351 210 lb (95.255 kg)     Height 08/13/15 2351 5\' 9"  (1.753 m)     Head Cir --      Peak Flow --      Pain Score 08/13/15 2353 9     Pain Loc --      Pain Edu? --      Excl. in [place]? --     Constitutional: Alert and oriented. Well appearing and in mild distress. Eyes: Conjunctivae are normal. PERRL. EOMI. Head: Atraumatic. Nose: No congestion/rhinnorhea. Mouth/Throat: Mucous membranes are moist.  Oropharynx non-erythematous. Cardiovascular: Normal rate, regular rhythm. Grossly normal heart sounds.  Good peripheral circulation. Respiratory: Normal respiratory effort.  No retractions. Lungs CTAB. Gastrointestinal: Soft and nontender. No distention. Positive bowel sounds Musculoskeletal: No lower extremity tenderness nor edema.   Neurologic:  Normal speech and language. Skin:  Skin is warm, dry and intact.  Psychiatric: Mood and affect are normal.   ____________________________________________   LABS (all labs ordered are listed, but only abnormal results are displayed)  Labs Reviewed  RAPID INFLUENZA A&B ANTIGENS ([facility] ONLY) - Abnormal; Notable for the following:    Influenza A [facility]) NOT DETECTED (*)    Influenza B ([facility]) NOT DETECTED (*)    All other components within normal limits  COMPREHENSIVE METABOLIC PANEL - Abnormal; Notable  for the following:    CO2 20 (*)    Glucose, Bld 176 (*)    BUN 28 (*)    Creatinine, Ser 1.99 (*)    GFR calc non Af Amer 31 (*)    GFR calc Af Amer 36 (*)    All other components within normal limits  TROPONIN I - Abnormal; Notable for the following:    Troponin I 0.06 (*)  All other components within normal limits  CULTURE, BLOOD (ROUTINE X 2)  CULTURE, BLOOD (ROUTINE X 2)  CBC  LACTIC ACID, PLASMA  LACTIC ACID, PLASMA  URINALYSIS COMPLETEWITH MICROSCOPIC ([facility] ONLY)   ____________________________________________  EKG  ED ECG REPORT I, Loney Hering, the attending physician, personally viewed and interpreted this ECG.   Date: 08/14/2015  EKG Time: 0028  Rate: 53  Rhythm: sinus bradycardia  Axis: normal  Intervals:none  ST&T Change: none  ____________________________________________  RADIOLOGY  CXR: No active cardiopulmonary disease ____________________________________________   PROCEDURES  Procedure(s) performed: None  Critical Care performed: No  ____________________________________________   INITIAL IMPRESSION / ASSESSMENT AND PLAN / ED COURSE  Pertinent labs & imaging results that were available during my care of the patient were reviewed by me and considered in my medical decision making (see chart for details).  This is a 75 year old male who comes into the hospital today with some flulike illness, chills and not feeling well. The patient was seen by his doctor today and had blood pressures in the 70s. When he arrived here he also had some what appeared to be low blood pressures. The patient went to x-ray as he was appearing well but when he stood he did have what appeared to be a syncopal episode. The patient appears to be orthostatic. We will check some flu test as well as some blood work and give the patient liter of normal saline. I will reassess the patient once he received his fluids and I have received the results of his blood  work.  The patient does appear orthostatic. I did give him 2 L of normal saline. His initial rapid influenza was negative. I will admit the patient as he does have an elevated troponin as well as the hypotension. We are awaiting the results of his urinalysis at this time. ____________________________________________   FINAL CLINICAL IMPRESSION(S) / ED DIAGNOSES  Final diagnoses:  Viral illness  Orthostatic hypotension      Loney Hering, MD 08/14/15 0221

## 2015-08-14 NOTE — Discharge Summary (Signed)
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 Hypertriglyceridemia   . Diabetes mellitus without complication ([place])   . Hypertension   . BPH (benign prostatic hyperplasia)   . Nocturia   . Elevated PSA     HOSPITAL COURSE:   75 year old male with past medical history of diabetes, hypertension, hyperlipidemia, BPH who presented to the hospital with a flulike illness and nausea vomiting and noted to be in acute renal failure.  #1 acute renal failure-this was secondary to hypotension and dehydration. Patient received some IV fluids and his BUN/creatinine has not improved and is back to baseline. His lisinopril, losartan were held and he will continue to hold them for the next few days and restart them next week after he follows up with his primary care physician.  #2 flulike illness-patient's influenza A and B were negative. He clinically had no fever, cough, congestion. He does have some generalized body aches. This is a nonspecific viral illness and is self-limiting and should improve on its own. He was advised to drink plenty of fluids and follow up with his PCP as an outpatient.  #3 type 2 diabetes without complication-patient will hold his metformin for the next few days and resume  it next week after he sees his primary care physician. He will continue his Lantus and glipizide.  #4 BPH-patient will resume his finasteride.  #5 GERD-patient will continue his omeprazole.  #6 hyperlipidemia-patient will resume his atorvastatin.  #7 Elevated Troponin - due to demand ischemia and ARF.  No chest pain and no ECG changes.   DISCHARGE CONDITIONS:   Stable.   CONSULTS OBTAINED:     DRUG ALLERGIES:   Allergies  Allergen Reactions  . Terazosin     Hypotension  . Trazodone And Nefazodone     Hypotension    DISCHARGE MEDICATIONS:   Discharge Medication List as of 08/14/2015  1:17 PM    CONTINUE these medications which have NOT CHANGED   Details  aspirin 81 MG tablet Take 81 mg by mouth daily., Until Discontinued, Historical Med    atorvastatin (LIPITOR) 40 MG tablet TAKE 1 TABLET EVERY DAY, Normal    finasteride (PROSCAR) 5 MG tablet TAKE 1 TABLET EVERY DAY, Normal    fluticasone (FLONASE) 50 MCG/ACT nasal spray Place 2 sprays into both nostrils daily as needed. , Until Discontinued, Historical Med    glipiZIDE (GLUCOTROL) 5 MG tablet TAKE 1 TABLET TWICE DAILY, Normal    insulin glargine (LANTUS) 100 UNIT/ML injection Inject 35 Units into the skin at bedtime., Until Discontinued, Historical Med    metFORMIN (GLUCOPHAGE) 500 MG tablet TAKE 1 TABLET (500 MG TOTAL) BY MOUTH 2 (TWO) TIMES DAILY WITH A MEAL., Normal    omeprazole (PRILOSEC) 20 MG capsule TAKE ONE CAPSULE BY MOUTH EVERY DAY, Normal  glucose blood test strip 1 each by Other route as needed for other. Use as instructed, Until Discontinued, Historical Med    lisinopril (PRINIVIL,ZESTRIL) 20 MG tablet TAKE 1 TABLET EVERY DAY, Normal    losartan (COZAAR) 50 MG tablet TAKE 1 TABLET ONE TIME DAILY, Normal         DISCHARGE INSTRUCTIONS:   DIET:  Cardiac diet and Diabetic diet  DISCHARGE CONDITION:  Stable  ACTIVITY:  Activity as tolerated  OXYGEN:  Home Oxygen: No.   Oxygen  Delivery: room air  DISCHARGE LOCATION:  home   If you experience worsening of your admission symptoms, develop shortness of breath, life threatening emergency, suicidal or homicidal thoughts you must seek medical attention immediately by calling 911 or calling your MD immediately  if symptoms less severe.  You Must read complete instructions/literature along with all the possible adverse reactions/side effects for all the Medicines you take and that have been prescribed to you. Take any new Medicines after you have completely understood and accpet all the possible adverse reactions/side effects.   Please note  You were cared for by a hospitalist during your hospital stay. If you have any questions about your discharge medications or the care you received while you were in the hospital after you are discharged, you can call the unit and asked to speak with the hospitalist on call if the hospitalist that took care of you is not available. Once you are discharged, your primary care physician will handle any further medical issues. Please note that NO REFILLS for any discharge medications will be authorized once you are discharged, as it is imperative that you return to your primary care physician (or establish a relationship with a primary care physician if you do not have one) for your aftercare needs so that they can reassess your need for medications and monitor your lab values.     Today   Still has some body aches.  Cr. Improved.  Hypotension resolved.  Overall feels better.   VITAL SIGNS:  Blood pressure 119/61, pulse 76, temperature 98.2 F (36.8 C), temperature source Oral, resp. rate 18, height 5\' 9"  (1.753 m), weight 99.655 kg (219 lb 11.2 oz), SpO2 97 %.  I/O:   Intake/Output Summary (Last 24 hours) at 08/14/15 1722 Last data filed at 08/14/15 1300  Gross per 24 hour  Intake   1116 ml  Output    425 ml  Net    691 ml    PHYSICAL EXAMINATION:  GENERAL:  75 y.o.-year-old  patient lying in the bed with no acute distress.  EYES: Pupils equal, round, reactive to light and accommodation. No scleral icterus. Extraocular muscles intact.  HEENT: Head atraumatic, normocephalic. Oropharynx and nasopharynx clear.  NECK:  Supple, no jugular venous distention. No thyroid enlargement, no tenderness.  LUNGS: Normal breath sounds bilaterally, no wheezing, rales,rhonchi. No use of accessory muscles of respiration.  CARDIOVASCULAR: S1, S2 normal. No murmurs, rubs, or gallops.  ABDOMEN: Soft, non-tender, non-distended. Bowel sounds present. No organomegaly or mass.  EXTREMITIES: No pedal edema, cyanosis, or clubbing.  NEUROLOGIC: Cranial nerves II through XII are intact. No focal motor or sensory defecits b/l.  PSYCHIATRIC: The patient is alert and oriented x 3. Good affect.  SKIN: No obvious rash, lesion, or ulcer.   DATA REVIEW:   CBC  Recent Labs Lab 08/14/15 0357  WBC 4.8  HGB 12.7*  HCT 38.2*  PLT 191    Chemistries   Recent Labs Lab 08/14/15 0011 08/14/15 0357  NA 135 138  K 4.1 4.7  CL 104 110  CO2 20* 19*  GLUCOSE 176* 218*  BUN 28* 25*  CREATININE 1.99* 1.59*  CALCIUM 9.1 8.2*  AST 25  --   ALT 19  --   ALKPHOS 67  --   BILITOT 0.6  --     Cardiac Enzymes  Recent Labs Lab 08/14/15 0357  TROPONINI <0.03    Microbiology Results  Results for orders placed or performed during the hospital encounter of 08/14/15  Rapid Influenza A&B Antigens ([place] only)     Status: Abnormal   Collection Time: 08/14/15 12:11 AM  Result Value Ref Range Status   Influenza A ([facility]) NOT DETECTED (A) NEGATIVE Final   Influenza B ([facility]) NOT DETECTED (A) NEGATIVE Final  Blood culture (routine x 2)     Status: None (Preliminary result)   Collection Time: 08/14/15 12:11 AM  Result Value Ref Range Status   Specimen Description BLOOD LEFT ANTECUBITAL  Final   Special Requests BOTTLES DRAWN AEROBIC AND ANAEROBIC 10ML  Final   Culture NO GROWTH < 12 HOURS  Final    Report Status PENDING  Incomplete  Blood culture (routine x 2)     Status: None (Preliminary result)   Collection Time: 08/14/15 12:11 AM  Result Value Ref Range Status   Specimen Description BLOOD RIGHT HAND  Final   Special Requests BOTTLES DRAWN AEROBIC AND ANAEROBIC 5ML  Final   Culture NO GROWTH < 12 HOURS  Final   Report Status PENDING  Incomplete    RADIOLOGY:  Dg Chest 2 View  08/14/2015  CLINICAL DATA:  75 year old male with cough EXAM: CHEST  2 VIEW COMPARISON:  Radiograph dated 11/02/2014 FINDINGS: Two views of the chest do not demonstrate a focal consolidation. There is no pleural effusion or pneumothorax. The cardiac silhouette is within normal limits. No acute osseous pathology. IMPRESSION: No active cardiopulmonary disease. Electronically Signed   By: Anner Crete M.D.   On: 08/14/2015 00:37      Management plans discussed with the patient, family and they are in agreement.  CODE STATUS:  Code Status History    Date Active Date Inactive Code Status Order ID Comments User Context   08/14/2015  3:21 AM 08/14/2015  5:21 PM Full Code [id number]  Lance Coon, MD Inpatient    Advance Directive Documentation        Most Recent Value   Type of Advance Directive  Living will   Pre-existing out of facility DNR order (yellow form or pink MOST form)     "MOST" Form in Place?        TOTAL TIME TAKING CARE OF THIS PATIENT: 40 minutes.    Henreitta Leber M.D on 08/14/2015 at 5:22 PM  Between 7am to 6pm - Pager - [phone]  After 6pm go to [email or web address] - password [email or web address] [facility]  Office  [phone]  CC: Primary care physician; Kathrine Haddock, NP

## 2015-08-14 NOTE — ED Notes (Signed)
Lab called troponin of 0.06 - reported to Dr Dahlia Client

## 2015-08-14 NOTE — ED Notes (Addendum)
Pt returned from xray - Reported by tech to have had syncopal episode in xray with lose of bowel and bladder - MD notified When cleaning pt, noticed that he had moderate amount of dried blood to the front of his underwear and around the head of his penis - pt denies difficulty voiding/pain/pressure - pt states he has lower abd pain and the last time he had "a bad infection" that he bled like this

## 2015-08-14 NOTE — H&P (Signed)
[facility] NAME: Scott Lawson    MR#:  [id number]  DATE OF BIRTH:  Jun 25, 1940  DATE OF ADMISSION:  08/14/2015  PRIMARY CARE PHYSICIAN: Kathrine Haddock, NP   REQUESTING/REFERRING PHYSICIAN: Dahlia Client, MD  CHIEF COMPLAINT:   Chief Complaint  Patient presents with  . Generalized Body Aches  . Cough    HISTORY OF PRESENT ILLNESS:  Scott Lawson  is a 75 y.o. male who presents with 5 days malaise, fatigue, joint pains and myalgias. Patient states that he started feeling bad 5 days ago, though he is unable to specify exactly what was wrong. He does state that he was around couple of his grandchildren who have since been diagnosed with influenza. By 3 days ago he said he was feeling significantly worse, that he had an appointment to see his outpatient physician and so just waited for that appointment rather than going in sooner. At his outpatient physician's office he was found to have a systolic blood pressure in the 80s. He was told to stop his blood pressure medicines, and that if he began to feel any worse to come into the hospital. After he left his outpatient physician's office he was feeling better after coming the ED for evaluation. Here he was found to be orthostatically hypotensive. He is also found on lab workup to have AKI.  Rapid flu was negative. Given his hypotension, significant dehydration, acute kidney injury hospitalists were called for admission.  PAST MEDICAL HISTORY:   Past Medical History  Diagnosis Date  . Hypertriglyceridemia   . Diabetes mellitus without complication ([place])   . Hypertension   . BPH (benign prostatic hyperplasia)   . Nocturia   . Elevated PSA     PAST SURGICAL HISTORY:   Past Surgical History  Procedure Laterality Date  . Appendectomy    . Eye surgery      SOCIAL HISTORY:   Social History  Substance Use Topics  . Smoking status: Never Smoker   . Smokeless tobacco: Never Used   . Alcohol Use: No    FAMILY HISTORY:   Family History  Problem Relation Age of Onset  . Cancer Mother     lung  . Heart disease Father   . Diabetes Brother     DRUG ALLERGIES:   Allergies  Allergen Reactions  . Terazosin     Hypotension  . Trazodone And Nefazodone     Hypotension    MEDICATIONS AT HOME:   Prior to Admission medications   Medication Sig Start Date End Date Taking? Authorizing Provider  aspirin 81 MG tablet Take 81 mg by mouth daily.   Yes Historical Provider, MD  atorvastatin (LIPITOR) 40 MG tablet TAKE 1 TABLET EVERY DAY 03/15/15  Yes Kathrine Haddock, NP  finasteride (PROSCAR) 5 MG tablet TAKE 1 TABLET EVERY DAY 07/12/15  Yes Kathrine Haddock, NP  fluticasone (FLONASE) 50 MCG/ACT nasal spray Place 2 sprays into both nostrils daily as needed.    Yes Historical Provider, MD  glipiZIDE (GLUCOTROL) 5 MG tablet TAKE 1 TABLET TWICE DAILY 06/25/15  Yes Kathrine Haddock, NP  insulin glargine (LANTUS) 100 UNIT/ML injection Inject 35 Units into the skin at bedtime.   Yes Historical Provider, MD  metFORMIN (GLUCOPHAGE) 500 MG tablet TAKE 1 TABLET (500 MG TOTAL) BY MOUTH 2 (TWO) TIMES DAILY WITH A MEAL. 06/25/15  Yes Kathrine Haddock, NP  omeprazole (PRILOSEC) 20 MG capsule TAKE ONE CAPSULE BY MOUTH EVERY DAY 08/13/15  Yes  Kathrine Haddock, NP  glucose blood test strip 1 each by Other route as needed for other. Use as instructed    Historical Provider, MD  lisinopril (PRINIVIL,ZESTRIL) 20 MG tablet TAKE 1 TABLET EVERY DAY Patient not taking: Reported on 08/14/2015 07/04/15   Kathrine Haddock, NP  losartan (COZAAR) 50 MG tablet TAKE 1 TABLET ONE TIME DAILY Patient not taking: Reported on 08/14/2015 07/12/15   Kathrine Haddock, NP    REVIEW OF SYSTEMS:  Review of Systems  Constitutional: Positive for chills and malaise/fatigue. Negative for fever and weight loss.  HENT: Negative for ear pain, hearing loss and tinnitus.   Eyes: Negative for blurred vision, double vision, pain and redness.   Respiratory: Negative for cough, hemoptysis and shortness of breath.   Cardiovascular: Negative for chest pain, palpitations, orthopnea and leg swelling.  Gastrointestinal: Negative for nausea, vomiting, abdominal pain, diarrhea and constipation.  Genitourinary: Negative for dysuria, frequency and hematuria.  Musculoskeletal: Positive for myalgias and joint pain (diffuse nonspecific). Negative for back pain and neck pain.  Skin:       No acne, rash, or lesions  Neurological: Positive for weakness. Negative for dizziness, tremors and focal weakness.  Endo/Heme/Allergies: Negative for polydipsia. Does not bruise/bleed easily.  Psychiatric/Behavioral: Negative for depression. The patient is not nervous/anxious and does not have insomnia.      VITAL SIGNS:   Filed Vitals:   08/13/15 2351 08/14/15 0008  BP: 95/44 111/64  Pulse: 66 60  Temp: 97.9 F (36.6 C) 97.4 F (36.3 C)  TempSrc:  Oral  Resp: 18 16  Height: 5\' 9"  (1.753 m)   Weight: 95.255 kg (210 lb)   SpO2: 96% 99%   Wt Readings from Last 3 Encounters:  08/13/15 95.255 kg (210 lb)  08/13/15 99.428 kg (219 lb 3.2 oz)  06/28/15 109.09 kg (240 lb 8 oz)    PHYSICAL EXAMINATION:  Physical Exam  Vitals reviewed. Constitutional: He is oriented to person, place, and time. He appears well-developed and well-nourished. No distress.  HENT:  Head: Normocephalic and atraumatic.  Very dry mucous membranes  Eyes: Conjunctivae and EOM are normal. Pupils are equal, round, and reactive to light. No scleral icterus.  Neck: Normal range of motion. Neck supple. No JVD present. No thyromegaly present.  Cardiovascular: Normal rate, regular rhythm and intact distal pulses.  Exam reveals no gallop and no friction rub.   No murmur heard. Respiratory: Effort normal and breath sounds normal. No respiratory distress. He has no wheezes. He has no rales.  GI: Soft. Bowel sounds are normal. He exhibits no distension. There is no tenderness.   Musculoskeletal: Normal range of motion. He exhibits no edema.  No arthritis, no gout  Lymphadenopathy:    He has no cervical adenopathy.  Neurological: He is alert and oriented to person, place, and time. No cranial nerve deficit.  No dysarthria, no aphasia  Skin: Skin is warm and dry. No rash noted. No erythema.  Psychiatric: He has a normal mood and affect. His behavior is normal. Judgment and thought content normal.    LABORATORY PANEL:   CBC  Recent Labs Lab 08/14/15 0011  WBC 5.3  HGB 13.7  HCT 41.0  PLT 219   ------------------------------------------------------------------------------------------------------------------  Chemistries   Recent Labs Lab 08/14/15 0011  NA 135  K 4.1  CL 104  CO2 20*  GLUCOSE 176*  BUN 28*  CREATININE 1.99*  CALCIUM 9.1  AST 25  ALT 19  ALKPHOS 67  BILITOT 0.6   ------------------------------------------------------------------------------------------------------------------  Cardiac Enzymes  Recent Labs Lab 08/14/15 0011  TROPONINI 0.06*   ------------------------------------------------------------------------------------------------------------------  RADIOLOGY:  Dg Chest 2 View  08/14/2015  CLINICAL DATA:  75 year old male with cough EXAM: CHEST  2 VIEW COMPARISON:  Radiograph dated 11/02/2014 FINDINGS: Two views of the chest do not demonstrate a focal consolidation. There is no pleural effusion or pneumothorax. The cardiac silhouette is within normal limits. No acute osseous pathology. IMPRESSION: No active cardiopulmonary disease. Electronically Signed   By: Anner Crete M.D.   On: 08/14/2015 00:37    EKG:   Orders placed or performed during the hospital encounter of 08/14/15  . ED EKG  . ED EKG  . EKG 12-Lead  . EKG 12-Lead    IMPRESSION AND PLAN:  Principal Problem:   AKI (acute kidney injury) ([place]) - creatinine of 1.99, prior normal based on chart review was around 1.1. This is likely due to  profound dehydration. We will hydrate him aggressively with IV fluids, and monitor for expected improvement. Avoid nephrotoxins. Active Problems:   Flu syndrome - rapid flu is negative in the ED, we will send influenza PCR panel. Regardless, the patient is outside the window for initiation of Tamiflu.   Uncontrolled type 2 diabetes mellitus with chronic kidney disease ([place]) - carb modified diet with sliding scale insulin and corresponding glucose checks   Essential hypertension, benign - patient's blood pressure has been low, holding home antihypertensives.   Dehydration - aggressive hydration with IV normal saline, recheck orthostatic blood pressure in the morning.   Elevated troponin - barely elevated, not felt to be ACS. However, we will trend for at least 2 more sets.   Hyperlipidemia - continue home meds   GERD (gastroesophageal reflux disease) - home dose PPI  All the records are reviewed and case discussed with ED provider. Management plans discussed with the patient and/or family.  DVT PROPHYLAXIS: SubQ heparin  GI PROPHYLAXIS: PPI  ADMISSION STATUS: Inpatient  CODE STATUS: Full Code Status History    This patient does not have a recorded code status. Please follow your organizational policy for patients in this situation.      TOTAL TIME TAKING CARE OF THIS PATIENT: 45 minutes.    Emery Dupuy Mahomet 08/14/2015, 2:15 AM  [facility]  Office  [phone]  CC: Primary care physician; Kathrine Haddock, NP

## 2015-08-14 NOTE — ED Notes (Addendum)
Pt states sick for the last week - c/o generalized body aches - diarrhea yesterday - denies nausea and vomiting - non-productive cough - MD in with patient

## 2015-08-14 NOTE — Progress Notes (Signed)
Patient to be discharged home with family. Discharge instructions given including to hold BP medications and metformin until folllow up appt. with primary care physician.  Patient stated understanding with all instructions. Haynes Hoehn RN

## 2015-08-16 ENCOUNTER — Encounter: Payer: Self-pay | Admitting: Unknown Physician Specialty

## 2015-08-16 ENCOUNTER — Ambulatory Visit (INDEPENDENT_AMBULATORY_CARE_PROVIDER_SITE_OTHER): Payer: Commercial Managed Care - HMO | Admitting: Unknown Physician Specialty

## 2015-08-16 VITALS — BP 156/79 | HR 83 | Temp 97.5°F | Ht 67.6 in | Wt 220.8 lb

## 2015-08-16 DIAGNOSIS — I10 Essential (primary) hypertension: Secondary | ICD-10-CM

## 2015-08-16 DIAGNOSIS — N179 Acute kidney failure, unspecified: Secondary | ICD-10-CM

## 2015-08-16 NOTE — Assessment & Plan Note (Signed)
Stop Losartan.  Restart Lisinopril 20 mg QD.

## 2015-08-16 NOTE — Progress Notes (Signed)
   BP 156/79 mmHg  Pulse 83  Temp(Src) 97.5 F (36.4 C)  Ht 5' 7.6" (1.717 m)  Wt 220 lb 12.8 oz (100.154 kg)  BMI 33.97 kg/m2  SpO2 95%   Subjective:    Patient ID: Scott Lawson., male    DOB: [birth year], 75 y.o.   MRN: [id number]  HPI: [place] Openshaw. is a 75 y.o. male  Chief Complaint  Patient presents with  . Follow-up    pt is here for 3 day f/u, states he still feels like he was hit by a dumo truck   F/u from hospitalization.  Pt went to the ER and admitted for one day following visit on Tuesday.  Pt diagnosed with unspecified viral illness and ARF with kidney functions improving after rehydration though upon review I note that GFR was 41 last check in the hospital and baseline is 65.  WBC was never elevated.  Last Hgb A1C was 8.2  Metformin and BP medications were held at discharge until follow up here.    Relevant past medical, surgical, family and social history reviewed and updated as indicated. Interim medical history since our last visit reviewed. Allergies and medications reviewed and updated.  Review of Systems  Per HPI unless specifically indicated above     Objective:    BP 156/79 mmHg  Pulse 83  Temp(Src) 97.5 F (36.4 C)  Ht 5' 7.6" (1.717 m)  Wt 220 lb 12.8 oz (100.154 kg)  BMI 33.97 kg/m2  SpO2 95%  Wt Readings from Last 3 Encounters:  08/16/15 220 lb 12.8 oz (100.154 kg)  08/14/15 219 lb 11.2 oz (99.655 kg)  08/13/15 219 lb 3.2 oz (99.428 kg)    Physical Exam  Constitutional: He is oriented to person, place, and time. He appears well-developed and well-nourished. No distress.  HENT:  Head: Normocephalic and atraumatic.  Eyes: Conjunctivae and lids are normal. Right eye exhibits no discharge. Left eye exhibits no discharge. No scleral icterus.  Neck: Normal range of motion. Neck supple. No JVD present. Carotid bruit is not present.  Cardiovascular: Normal rate, regular rhythm and normal heart sounds.   Pulmonary/Chest: Effort  normal and breath sounds normal. No respiratory distress.  Abdominal: Normal appearance. There is no splenomegaly or hepatomegaly.  Musculoskeletal: Normal range of motion.  Neurological: He is alert and oriented to person, place, and time.  Skin: Skin is warm, dry and intact. No rash noted. No pallor.  Psychiatric: He has a normal mood and affect. His behavior is normal. Judgment and thought content normal.      Assessment & Plan:   Problem List Items Addressed This Visit      Unprioritized   Essential hypertension, benign    Stop Losartan.  Restart Lisinopril 20 mg QD.         Other Visit Diagnoses    Acute renal failure, unspecified acute renal failure type ([place])    -  Primary    Check CMP.  If kidney functions improved restart Metformin       Written instructions given.    Follow up plan: Return in about 3 months (around 11/16/2015).

## 2015-08-16 NOTE — Patient Instructions (Signed)
OK to take Tylenol.  Do not take Ibuprofen or Aleve.    Restart Lisinopril 20 mg but discontinue Losartan 50 mg.

## 2015-08-17 LAB — COMPREHENSIVE METABOLIC PANEL
A/G RATIO: 1.4 (ref 1.1–2.5)
ALK PHOS: 74 IU/L (ref 39–117)
ALT: 20 IU/L (ref 0–44)
AST: 22 IU/L (ref 0–40)
Albumin: 4 g/dL (ref 3.5–4.8)
BILIRUBIN TOTAL: 0.2 mg/dL (ref 0.0–1.2)
BUN/Creatinine Ratio: 14 (ref 10–22)
BUN: 18 mg/dL (ref 8–27)
CO2: 22 mmol/L (ref 18–29)
Calcium: 9.5 mg/dL (ref 8.6–10.2)
Chloride: 99 mmol/L (ref 96–106)
Creatinine, Ser: 1.3 mg/dL — ABNORMAL HIGH (ref 0.76–1.27)
GFR calc Af Amer: 62 mL/min/{1.73_m2} (ref >59)
GFR calc non Af Amer: 54 mL/min/{1.73_m2} — ABNORMAL LOW (ref >59)
GLOBULIN, TOTAL: 2.8 g/dL (ref 1.5–4.5)
Glucose: 322 mg/dL — ABNORMAL HIGH (ref 65–99)
POTASSIUM: 5 mmol/L (ref 3.5–5.2)
SODIUM: 139 mmol/L (ref 134–144)
Total Protein: 6.8 g/dL (ref 6.0–8.5)

## 2015-08-19 LAB — CULTURE, BLOOD (ROUTINE X 2)
CULTURE: NO GROWTH
Culture: NO GROWTH

## 2015-09-09 ENCOUNTER — Other Ambulatory Visit: Payer: Self-pay | Admitting: Unknown Physician Specialty

## 2015-10-08 ENCOUNTER — Ambulatory Visit (INDEPENDENT_AMBULATORY_CARE_PROVIDER_SITE_OTHER): Payer: Commercial Managed Care - HMO | Admitting: Unknown Physician Specialty

## 2015-10-08 ENCOUNTER — Encounter: Payer: Self-pay | Admitting: Unknown Physician Specialty

## 2015-10-08 VITALS — BP 145/78 | HR 76 | Temp 98.2°F | Ht 66.3 in | Wt 218.8 lb

## 2015-10-08 DIAGNOSIS — M79642 Pain in left hand: Secondary | ICD-10-CM

## 2015-10-08 MED ORDER — METHYLPREDNISOLONE 4 MG PO TBPK: ORAL_TABLET | ORAL | Status: DC

## 2015-10-08 NOTE — Progress Notes (Signed)
BP 145/78 mmHg  Pulse 76  Temp(Src) 98.2 F (36.8 C)  Ht 5' 6.3" (1.684 m)  Wt 218 lb 12.8 oz (99.247 kg)  BMI 35.00 kg/m2  SpO2 97%   Subjective:    Patient ID: Scott Roof., male    DOB: 05-12-1941, 75 y.o.   MRN: [id number]  HPI: Scott Sanguino. is a 75 y.o. male  Chief Complaint  Patient presents with  . Hand Pain    pt states he has been having pain in left hand, states the ends of his fingers are numb   Hand pain For 2 months every since February since he had IV's.  Tender back of his left hand.  All the tips of his fingers seem numb and the numbness radiates halfway down hand.  States it is getting worse and unable to pick up things.  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 Interim medical history since our last visit reviewed. Allergies and medications reviewed and updated.  Review of Systems  Per HPI unless specifically indicated above     Objective:    BP 145/78 mmHg  Pulse 76  Temp(Src) 98.2 F (36.8 C)  Ht 5' 6.3" (1.684 m)  Wt 218 lb 12.8 oz (99.247 kg)  BMI 35.00 kg/m2  SpO2 97%  Wt Readings from Last 3 Encounters:  10/08/15 218 lb 12.8 oz (99.247 kg)  08/16/15 220 lb 12.8 oz (100.154 kg)  08/14/15 219 lb 11.2 oz (99.655 kg)    Physical Exam  Constitutional: He is oriented to person, place, and time. He appears well-developed and well-nourished. No distress.  HENT:  Head: Normocephalic and atraumatic.  Eyes: Conjunctivae and lids are normal. Right eye exhibits no discharge. Left eye exhibits no discharge. No scleral icterus.  Cardiovascular: Normal rate.   Pulmonary/Chest: Effort normal.  Abdominal: Normal appearance. There is no splenomegaly or hepatomegaly.  Musculoskeletal:       Left hand: He exhibits decreased range of motion and tenderness. Normal sensation noted.  Unable to make a  fist  Tender across MCP joints but no swelling noted.  Brisk cap refill.  Good pulses  Neurological: He is alert and oriented to person, place, and time.  Skin: Skin is intact. No rash noted. No pallor.  Psychiatric: He has a normal mood and affect. His behavior is normal. Judgment and thought content normal.    Results for orders placed or performed in visit on 08/16/15  Comprehensive metabolic panel  Result Value Ref Range   Glucose 322 (H) 65 - 99 mg/dL   BUN 18 8 - 27 mg/dL   Creatinine, Ser 1.30 (H) 0.76 - 1.27 mg/dL   GFR calc non Af Amer 54 (L) >59 mL/min/1.73   GFR calc Af Amer 62 >59 mL/min/1.73   BUN/Creatinine Ratio 14 10 - 22   Sodium 139 134 - 144 mmol/L   Potassium 5.0 3.5 - 5.2 mmol/L   Chloride 99 96 - 106 mmol/L   CO2 22 18 - 29 mmol/L   Calcium 9.5 8.6 - 10.2 mg/dL   Total Protein 6.8 6.0 - 8.5 g/dL   Albumin 4.0 3.5 - 4.8 g/dL   Globulin, Total 2.8 1.5 - 4.5 g/dL   Albumin/Globulin Ratio 1.4 1.1 - 2.5   Bilirubin Total 0.2 0.0 - 1.2 mg/dL   Alkaline Phosphatase 74  39 - 117 IU/L   AST 22 0 - 40 IU/L   ALT 20 0 - 44 IU/L      Assessment & Plan:   Problem List Items Addressed This Visit    None    Visit Diagnoses    Pain of left hand    -  Primary    Tender MCP.  Check rheumatoid labs.  Medrol dose pack though steroids will increase blood sugar.  Unable to give NSAIDS due to renal problems.      Relevant Orders    Ambulatory referral to Orthopedic Surgery        Follow up plan: Return for regular f/u next month.

## 2015-10-15 DIAGNOSIS — G5602 Carpal tunnel syndrome, left upper limb: Secondary | ICD-10-CM | POA: Diagnosis not present

## 2015-10-21 ENCOUNTER — Other Ambulatory Visit: Payer: Self-pay | Admitting: Unknown Physician Specialty

## 2015-10-21 NOTE — Telephone Encounter (Signed)
My records say he is on Lisinopril, not Losartan

## 2015-10-21 NOTE — Telephone Encounter (Signed)
Called pharmacy and let them know that the patient is no longer taking losartan.

## 2015-10-21 NOTE — Telephone Encounter (Signed)
Called and confirmed with the patient that he is taking lisinopril and not losartan.

## 2015-10-22 DIAGNOSIS — G5602 Carpal tunnel syndrome, left upper limb: Secondary | ICD-10-CM | POA: Diagnosis not present

## 2015-10-23 ENCOUNTER — Other Ambulatory Visit: Payer: Self-pay | Admitting: Unknown Physician Specialty

## 2015-10-29 ENCOUNTER — Ambulatory Visit: Payer: Commercial Managed Care - HMO | Attending: Specialist | Admitting: Occupational Therapy

## 2015-10-29 DIAGNOSIS — R208 Other disturbances of skin sensation: Secondary | ICD-10-CM | POA: Insufficient documentation

## 2015-10-29 DIAGNOSIS — M25642 Stiffness of left hand, not elsewhere classified: Secondary | ICD-10-CM | POA: Diagnosis not present

## 2015-10-29 DIAGNOSIS — M79642 Pain in left hand: Secondary | ICD-10-CM | POA: Diagnosis not present

## 2015-10-29 DIAGNOSIS — M6281 Muscle weakness (generalized): Secondary | ICD-10-CM

## 2015-10-29 NOTE — Patient Instructions (Signed)
Pt to do contrast  Prayer stretch for wrist extenion Digits extention on table - tapping   tendon glides  Opposition - alternate digits 2 cm block

## 2015-10-29 NOTE — Therapy (Signed)
[facility] [address] [place], [address] Phone: [phone]   Fax:  [phone]  Occupational Therapy Treatment  Patient Details  Name: Scott Lawson. MRN: [id number] Date of Birth: July 22, 1940 Referring Provider: Earnestine Leys  Encounter Date: 10/29/2015      OT End of Session - 10/29/15 2104    Visit Number 1   Number of Visits 8   Date for OT Re-Evaluation 11/26/15   OT Start Time 1001   OT Stop Time 1059   OT Time Calculation (min) 58 min   Activity Tolerance Patient tolerated treatment well   Behavior During Therapy [facility] for tasks assessed/performed      Past Medical History  Diagnosis Date  . Hypertriglyceridemia   . Diabetes mellitus without complication ([place])   . Hypertension   . BPH (benign prostatic hyperplasia)   . Nocturia   . Elevated PSA     Past Surgical History  Procedure Laterality Date  . Appendectomy    . Eye surgery      There were no vitals filed for this visit.      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 Yes   Pain Score 7    Pain Location Hand   Pain Orientation Left   Pain Descriptors / Indicators Tightness;Tingling;Shooting;Stabbing;Aching   Pain Onset More than a month ago            [facility] OT Assessment - 10/29/15 0001    Assessment   Diagnosis L CTS   Referring Provider Earnestine Leys   Onset Date 08/12/15   Precautions   Required Braces or Orthoses Other Brace/Splint   Other Brace/Splint Wrist splint at night time but not helping    Prior Function   Level of  Independence Independent   Vocation --  farmer and Dealer   Leisure Pt work on farm , work on cars , R hand dominant -    AROM   Left Wrist Extension 40 Degrees   Left Wrist Flexion 40 Degrees   Left Wrist Ulnar Deviation 10 Degrees   Strength   Right Hand Grip (lbs) 25   Right Hand Lateral Pinch 15 lbs   Right Hand 3 Point Pinch 10 lbs   Left Hand Grip (lbs) 12   Left Hand Lateral Pinch 5 lbs   Left Hand 3 Point Pinch 5 lbs   Left Hand AROM   L Thumb Opposition to Index --  Opposition to 3rd - lack 2 cm to 4th    L Index  MCP 0-90 40 Degrees  -40   L Index PIP 0-100 70 Degrees  -20   L Long  MCP 0-90 45 Degrees  -40   L Long PIP 0-100 80 Degrees  -40   L Ring  MCP 0-90 35 Degrees  -30   L Ring PIP 0-100 85 Degrees  -30   L Little  MCP 0-90 40 Degrees  -20   L  Little PIP 0-100 45 Degrees  -10      Fluidotherapy with AROM of wrist and digits done to increase ROM prior to review of HEP  Pt had increase in symptoms in hand - but had increase AROM in digits ext and flexion  HEP reviewed and hand out                      OT Education - 10/29/15 2104    Education provided Yes   Education Details ROM    Person(s) Educated Patient   Methods Explanation;Demonstration;Tactile cues;Verbal cues;Handout   Comprehension Verbal cues required;Returned demonstration;Verbalized understanding          OT Short Term Goals - 10/29/15 2113    OT SHORT TERM GOAL #1   Title Pain on PRWHE improve with at least 20 points    Baseline Pain on PRWHE 47/50   Time 3   Period Weeks   Status New   OT SHORT TERM GOAL #2   Title Extention of diigits improve for pt to lay hand flat on table and reach into pocket   Baseline Extention impaired MC's -20 to -40 and PIP 's-10 to -40   Time 3   Period Weeks   Status New   OT SHORT TERM GOAL #3   Title Pt to be ind in HEP to decrease numbness/pins and needles  pain to less than 3/10    Baseline 10/10 at the worse - no  knowledge of HEP    Time 2   Period Weeks   Status New           OT Long Term Goals - 10/29/15 2118    OT LONG TERM GOAL #1   Title Flexion of digits improve for pt to touch palm to hold onto tools , broom    Baseline Flexion MC's 35-45; PIP's 45 to 80   Time 4   Period Weeks   Status New   OT LONG TERM GOAL #2   Title Grip strength improve with  5 lbs to carry/pull  at least 5 lbs    Baseline Grip R 25, L 12 lbs   Time 4   Period Weeks   Status New               Plan - 10/29/15 2107    Clinical Impression Statement Pt present about 10 wks-  from having been in the hospital and had IV in dorsal hand resulting in edema - since then numbness/pins nad needles in fingers and palm - pain over CT - decrease AROM in diigts flexion and extention  as well as grip strength    Rehab Potential Fair   OT Frequency 2x / week   OT Duration 4 weeks   OT Treatment/Interventions Self-care/ADL training;Fluidtherapy;Contrast Bath;Splinting;Patient/family education;Therapeutic exercises;Ultrasound;Passive range of motion;Manual Therapy   Plan Assess how doing with HEP - assess progress    OT Home Exercise Plan see pt instruction    Consulted and Agree with Plan of Care Patient      Patient will benefit from skilled therapeutic intervention in order to improve the following deficits and impairments:  Impaired flexibility, Decreased range of motion, Decreased coordination, Impaired sensation, Impaired UE functional use, Pain, Decreased strength  Visit Diagnosis: Stiffness of left hand, not elsewhere classified - Plan: Ot plan of care cert/re-cert  Pain in left hand - Plan: Ot plan of care cert/re-cert  Muscle weakness (generalized) - Plan: Ot plan of care cert/re-cert  Other disturbances of skin sensation - Plan: Ot plan of care cert/re-cert    Problem List Patient Active Problem List   Diagnosis Date Noted  . Dehydration 08/14/2015  . Flu syndrome 08/14/2015  . AKI (acute  kidney injury) ([place]) 08/14/2015  . Elevated troponin 08/14/2015  . GERD (gastroesophageal reflux disease) 08/14/2015  . Uncontrolled type 2 diabetes mellitus with chronic kidney disease ([place]) 08/13/2015  . Essential hypertension, benign 08/13/2015  . 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 Hyperlipidemia 02/11/2015  . BPH (benign prostatic hyperplasia) 02/11/2015  . Type 2 diabetes mellitus with chronic kidney disease ([place]) 02/11/2015  . Hypertensive CKD (chronic kidney disease) 02/11/2015    Rosalyn Gess OTR/L,CLT 10/29/2015, 9:29 PM  [facility] [address] [place], [address] Phone: [phone]   Fax:  [phone]  Name: Kedus Drouhard. MRN: [id number] Date of Birth: 1940/07/14

## 2015-11-05 ENCOUNTER — Encounter: Payer: Commercial Managed Care - HMO | Admitting: Occupational Therapy

## 2015-11-07 ENCOUNTER — Telehealth: Payer: Self-pay | Admitting: Unknown Physician Specialty

## 2015-11-07 ENCOUNTER — Encounter: Payer: Self-pay | Admitting: Occupational Therapy

## 2015-11-07 ENCOUNTER — Ambulatory Visit: Payer: Commercial Managed Care - HMO | Admitting: Occupational Therapy

## 2015-11-07 DIAGNOSIS — M6281 Muscle weakness (generalized): Secondary | ICD-10-CM | POA: Diagnosis not present

## 2015-11-07 DIAGNOSIS — R208 Other disturbances of skin sensation: Secondary | ICD-10-CM | POA: Diagnosis not present

## 2015-11-07 DIAGNOSIS — M79642 Pain in left hand: Secondary | ICD-10-CM

## 2015-11-07 DIAGNOSIS — M25642 Stiffness of left hand, not elsewhere classified: Secondary | ICD-10-CM | POA: Diagnosis not present

## 2015-11-07 NOTE — Patient Instructions (Signed)
MEDIAN NERVE: Mobilization I    Stand with right elbow resting at side, palm up. Use opposite hand to pull hand back, fingers relaxed. Do ___ sets of ___ repetitions per session. Do ___ sessions per day.  Copyright  VHI. All rights reserved.

## 2015-11-07 NOTE — Telephone Encounter (Signed)
Spoke with patient, I told him CW had discontinued his Losartan and started him on Lisinopril.  He denies every getting that Rx and says his pharmacy does not have it either.   Please resend the Lisinopril to [facility].

## 2015-11-07 NOTE — Telephone Encounter (Signed)
Medication Refill: Losartan 50mg  Pharmacy:[facility]  Patient called stating that he needs refill on his medication sent to his pharmacy, thanks.

## 2015-11-07 NOTE — Therapy (Signed)
[facility] [address], [place], [address] Phone: [phone]   Fax:  [phone]  Occupational Therapy Treatment  Patient Details  Name: Scott Lawson. MRN: [id number] Date of Birth: [birth year] Referring Provider: Earnestine Leys  Encounter Date: 11/07/2015      OT End of Session - 11/07/15 1204    Visit Number 2   Number of Visits 8   Date for OT Re-Evaluation 11/26/15   OT Start Time 0954   OT Stop Time 1038   OT Time Calculation (min) 44 min   Equipment Utilized During Treatment Pulsed [place]; Fluidotherapy   Activity Tolerance Patient tolerated treatment well   Behavior During Therapy [facility] for tasks assessed/performed      Past Medical History  Diagnosis Date  . Hypertriglyceridemia   . Diabetes mellitus without complication ([place])   . Hypertension   . BPH (benign prostatic hyperplasia)   . Nocturia   . Elevated PSA     Past Surgical History  Procedure Laterality Date  . Appendectomy    . Eye surgery      There were no vitals filed for this visit.      Subjective Assessment - 11/07/15 0959    Subjective  Pt reports 810 pain left hand. He reports that he is performing HEP 2x/day left hand. He also states that he is waking up with pain 4-5x/noc.   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 Yes   Pain Score 8    Pain Location Hand   Pain Orientation Left   Pain Descriptors / Indicators Tightness;Tingling;Shooting;Stabbing;Aching   Pain Type Chronic pain   Pain Onset More than a month ago   Pain Frequency Constant   Aggravating Factors  Left hand use, exercises   Pain Relieving Factors "Nothing"   Effect of Pain on Daily Activities Difficulty performing work on farm                      OT Treatments/Exercises  (OP) - 11/07/15 0001    ADLs   ADL Comments Pt reports Mod I using right hand only. "I can not do my work with this left hand"   Exercises   Exercises Hand  Left hand   Hand Exercises   Tendon Glides Left hand tendon gliding   x10 reps each   Other Hand Exercises Median nerve gliding left UE  x10 reps each   Other Hand Exercises Reviewed HEP as previously issued: hold off on sponge exercises after performance today as pt states that these cause paresthesias & increased pain left hand.   Modalities   Modalities Fluidotherapy;Ultrasound   Ultrasound   Ultrasound Location left carpal tunnel pulsed [place] @ 1.0 w/cm2 w/ 98mHz x8 min   Ultrasound Goals Edema;Pain   LUE Fluidotherapy   Number Minutes Fluidotherapy 10 Minutes   LUE Fluidotherapy Location Hand;Wrist   Comments to assist with decreased pain, increased AROM/flexibility   Manual Therapy   Manual Therapy Edema management;Soft tissue mobilization   Manual therapy comments 10 min   Edema Management Left hand/carpal tunnel area and retrograde massege                 OT Education - 11/07/15 1203    Education  Details Tendon gliding, added median nerve gliding, d/c ROM using sponge pieces left hand   Person(s) Educated Patient   Methods Explanation;Demonstration;Tactile cues;Verbal cues;Handout   Comprehension Verbalized understanding;Returned demonstration;Verbal cues required          OT Short Term Goals - 10/29/15 2113    OT SHORT TERM GOAL #1   Title Pain on PRWHE improve with at least 20 points    Baseline Pain on PRWHE 47/50   Time 3   Period Weeks   Status New   OT SHORT TERM GOAL #2   Title Extention of diigits improve for pt to lay hand flat on table and reach into pocket   Baseline Extention impaired MC's -20 to -40 and PIP 's-10 to -40   Time 3   Period Weeks   Status New   OT SHORT TERM GOAL #3   Title Pt to be ind in HEP to decrease numbness/pins and needles  pain to less than 3/10    Baseline 10/10  at the worse - no knowledge of HEP    Time 2   Period Weeks   Status New           OT Long Term Goals - 10/29/15 2118    OT LONG TERM GOAL #1   Title Flexion of digits improve for pt to touch palm to hold onto tools , broom    Baseline Flexion MC's 35-45; PIP's 45 to 80   Time 4   Period Weeks   Status New   OT LONG TERM GOAL #2   Title Grip strength improve with  5 lbs to carry/pull  at least 5 lbs    Baseline Grip R 25, L 12 lbs   Time 4   Period Weeks   Status New               Plan - 11/07/15 1205    Clinical Impression Statement Pt continues to be limited by pain and numbness left hand/carpal tunnel area after >10 weeks of symptoms. He should benefit from median nerve gliding, tendon gliding and pt/family education. He plans to f/u w/ Dr Sabra Heck on 11/19/15.   Rehab Potential Fair   OT Frequency 2x / week   OT Duration 4 weeks   OT Treatment/Interventions Self-care/ADL training;Fluidtherapy;Contrast Bath;Splinting;Patient/family education;Therapeutic exercises;Ultrasound;Passive range of motion;Manual Therapy   Plan Assess pain, home program/median nerve glides and splint check (as issued by MD office).   OT Home Exercise Plan see pt instruction    Consulted and Agree with Plan of Care Patient      Patient will benefit from skilled therapeutic intervention in order to improve the following deficits and impairments:  Impaired flexibility, Decreased range of motion, Decreased coordination, Impaired sensation, Impaired UE functional use, Pain, Decreased strength  Visit Diagnosis: Stiffness of left hand, not elsewhere classified  Pain in left hand  Muscle weakness (generalized)  Other disturbances of skin sensation    Problem List Patient Active Problem List   Diagnosis Date Noted  . Dehydration 08/14/2015  . Flu syndrome 08/14/2015  . AKI (acute kidney injury) ([place]) 08/14/2015  . Elevated troponin 08/14/2015  . GERD (gastroesophageal reflux disease)  08/14/2015  . Uncontrolled type 2 diabetes mellitus with chronic kidney disease ([place]) 08/13/2015  . Essential hypertension, benign 08/13/2015  . 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 Hyperlipidemia 02/11/2015  . BPH (benign prostatic hyperplasia) 02/11/2015  . Type 2 diabetes mellitus with chronic kidney disease ([place]) 02/11/2015  . Hypertensive  CKD (chronic kidney disease) 02/11/2015    Almyra Deforest, OTR/L 11/07/2015, 12:10 PM  [facility] [address] [place], [address] Phone: [phone]   Fax:  [phone]  Name: Scott Lawson. MRN: [id number] Date of Birth: [birth year]

## 2015-11-08 MED ORDER — LISINOPRIL 20 MG PO TABS: 20.0000 mg | ORAL_TABLET | Freq: Every day | ORAL | Status: DC

## 2015-11-13 ENCOUNTER — Ambulatory Visit: Payer: Commercial Managed Care - HMO | Admitting: Occupational Therapy

## 2015-11-13 ENCOUNTER — Encounter: Payer: Self-pay | Admitting: Occupational Therapy

## 2015-11-13 DIAGNOSIS — R208 Other disturbances of skin sensation: Secondary | ICD-10-CM | POA: Diagnosis not present

## 2015-11-13 DIAGNOSIS — M6281 Muscle weakness (generalized): Secondary | ICD-10-CM | POA: Diagnosis not present

## 2015-11-13 DIAGNOSIS — M25642 Stiffness of left hand, not elsewhere classified: Secondary | ICD-10-CM

## 2015-11-13 DIAGNOSIS — M79642 Pain in left hand: Secondary | ICD-10-CM | POA: Diagnosis not present

## 2015-11-13 NOTE — Therapy (Signed)
[facility] [address], [place], [address] Phone: [phone]   Fax:  [phone]  Occupational Therapy Treatment  Patient Details  Name: Scott Lawson. MRN: [id number] Date of Birth: 1940-06-17 Referring Provider: Earnestine Leys  Encounter Date: 11/13/2015      OT End of Session - 11/13/15 0907    Visit Number 3   Number of Visits 8   Date for OT Re-Evaluation 11/26/15   OT Start Time 0809   OT Stop Time 0859   OT Time Calculation (min) 50 min   Equipment Utilized During Treatment Pulsed [place]; Fluidotherapy   Activity Tolerance Patient tolerated treatment well   Behavior During Therapy WFL for tasks assessed/performed      Past Medical History  Diagnosis Date  . Hypertriglyceridemia   . Diabetes mellitus without complication ([place])   . Hypertension   . BPH (benign prostatic hyperplasia)   . Nocturia   . Elevated PSA     Past Surgical History  Procedure Laterality Date  . Appendectomy    . Eye surgery      There were no vitals filed for this visit.          [facility] OT Assessment - 11/13/15 0001    AROM   Left Wrist Extension 40 Degrees   Left Wrist Flexion 50 Degrees   Left Wrist Ulnar Deviation 25 Degrees   Strength   Right Hand Grip (lbs) 25   Left Hand Grip (lbs) 10   Left Hand Lateral Pinch 5 lbs   Left Hand 3 Point Pinch 4 lbs   Left Hand AROM   L Thumb Opposition to Index --  Opposition to tip of small w/ stiffness and increased time   L Index  MCP 0-90 68 Degrees   L Index PIP 0-100 68 Degrees   L Long  MCP 0-90 66 Degrees   L Long PIP 0-100 77 Degrees   L Ring  MCP 0-90 65 Degrees   L Ring PIP 0-100 84 Degrees   L Little  MCP 0-90 72 Degrees   L Little PIP 0-100 38 Degrees   Hand Function   Comment PROM left hand = flat fist. No composite fist secondary to stiffness                  OT Treatments/Exercises (OP) - 11/13/15 0001    Exercises   Exercises Hand  Left hand   Hand Exercises   Tendon Glides Left hand tendon gliding   x10 reps each   Other Hand Exercises Median nerve gliding left UE  x10 reps each   Other Hand Exercises Reviewed HEP as previously issued: hold off on sponge exercises after performance today as pt states that these cause paresthesias & increased pain left hand.   Modalities   Modalities Fluidotherapy;Ultrasound   Ultrasound   Ultrasound Location Left carpal tunnel pulsed [place] at 1.0 w/cm2 w/ 86mz x83m   Ultrasound Parameters --  See above   Ultrasound Goals Edema;Pain   LUE Fluidotherapy   Number Minutes Fluidotherapy 10 Minutes   LUE Fluidotherapy Location Hand;Wrist   Comments to assist with decreased pain, increased AROM and flexibility   Splinting   Splinting Splint check left as issued per MD office. Pt uses at noc, vc's to loosen straps slightly per his reports of increased paresthesias/numbness with use at noc. Straps are most likely too tight as pt appears to be wearing correctly and splint is  well fitting.  Pt verbalized understanding                OT Education - 11/13/15 0820    Education provided Yes   Education Details Median nerve gliding, tendon gliding, d/c ROM w/ sponge pieces left hand   Person(s) Educated Patient   Methods Explanation;Demonstration;Tactile cues;Verbal cues   Comprehension Verbalized understanding;Returned demonstration;Verbal cues required          OT Short Term Goals - 11/13/15 0904    OT SHORT TERM GOAL #1   Title Pain on PRWHE improve with at least 20 points    Baseline Eval = Pain on PRWHE 47/50; 11/13/15 = 41/50   Time 3   Period Weeks   Status Not Met           OT Long Term Goals - 11/13/15 0848    OT LONG TERM GOAL #1   Title Flexion of digits improve for pt to touch palm to hold onto tools , broom    Baseline Active Flexion MCP's 65-72*; PIP's 38-68* left hand. PROM = flat fist after stretch, pt unable to hold in this position.    Time 4   Period Weeks   Status On-going   OT LONG TERM GOAL #2   Title Grip strength improve with  5 lbs to carry/pull  at least 5 lbs    Baseline Eval = Grip R 25, L 12 lbs; 11/13/15 = Grip R = 25, L = 10#   Time 4   Period Weeks   Status On-going               Plan - 11/13/15 0916    Rehab Potential Fair   OT Frequency 2x / week   OT Duration 4 weeks   OT Treatment/Interventions Self-care/ADL training;Fluidtherapy;Contrast Bath;Splinting;Patient/family education;Therapeutic exercises;Ultrasound;Passive range of motion;Manual Therapy   Plan Assess symptoms/pain, median nerve and tendon gliding, assess goals (pt has f/u w/ MD on 6/517).   OT Home Exercise Plan see pt instruction    Consulted and Agree with Plan of Care Patient      Patient will benefit from skilled therapeutic intervention in order to improve the following deficits and impairments:  Impaired flexibility, Decreased range of motion, Decreased coordination, Impaired sensation, Impaired UE functional use, Pain, Decreased strength  Visit Diagnosis: Stiffness of left hand, not elsewhere classified  Pain in left hand  Muscle weakness (generalized)  Other disturbances of skin sensation    Problem List Patient Active Problem List   Diagnosis Date Noted  . Dehydration 08/14/2015  . Flu syndrome 08/14/2015  . AKI (acute kidney injury) ([place]) 08/14/2015  . Elevated troponin 08/14/2015  . GERD (gastroesophageal reflux disease) 08/14/2015  . Uncontrolled type 2 diabetes mellitus with chronic kidney disease ([place]) 08/13/2015  . Essential hypertension, benign 08/13/2015  . 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 Hyperlipidemia 02/11/2015  . BPH (benign prostatic hyperplasia) 02/11/2015  . Type 2 diabetes mellitus with chronic kidney disease ([place]) 02/11/2015  . Hypertensive CKD (chronic kidney disease) 02/11/2015    Almyra Deforest, OTR/L 11/13/2015, 9:19 AM  [facility] [address], [place], [address] Phone: [phone]   Fax:  [phone]  Name: Scott Lawson. MRN: [id number] Date of Birth: [birth year]

## 2015-11-15 ENCOUNTER — Ambulatory Visit: Payer: Self-pay | Admitting: Unknown Physician Specialty

## 2015-11-15 ENCOUNTER — Ambulatory Visit: Payer: Commercial Managed Care - HMO | Attending: Specialist | Admitting: Occupational Therapy

## 2015-11-15 ENCOUNTER — Encounter: Payer: Self-pay | Admitting: Occupational Therapy

## 2015-11-15 DIAGNOSIS — M79642 Pain in left hand: Secondary | ICD-10-CM | POA: Insufficient documentation

## 2015-11-15 DIAGNOSIS — M25642 Stiffness of left hand, not elsewhere classified: Secondary | ICD-10-CM | POA: Diagnosis not present

## 2015-11-15 DIAGNOSIS — M6281 Muscle weakness (generalized): Secondary | ICD-10-CM | POA: Insufficient documentation

## 2015-11-15 DIAGNOSIS — R208 Other disturbances of skin sensation: Secondary | ICD-10-CM | POA: Diagnosis not present

## 2015-11-15 NOTE — Therapy (Signed)
[facility] [address] [place], [address] Phone: [phone]   Fax:  [phone]  Occupational Therapy Treatment  Patient Details  Name: Scott Lawson. MRN: [id number] Date of Birth: [birth year] Referring Provider: Earnestine Leys  Encounter Date: 11/15/2015      OT End of Session - 11/15/15 1043    Visit Number 4   Number of Visits 8   Date for OT Re-Evaluation 11/26/15   OT Start Time 0954   OT Stop Time 1037   OT Time Calculation (min) 43 min   Equipment Utilized During Treatment Pulsed [place]; Fluidotherapy   Activity Tolerance Patient tolerated treatment well   Behavior During Therapy WFL for tasks assessed/performed      Past Medical History  Diagnosis Date  . Hypertriglyceridemia   . Diabetes mellitus without complication ([place])   . Hypertension   . BPH (benign prostatic hyperplasia)   . Nocturia   . Elevated PSA     Past Surgical History  Procedure Laterality Date  . Appendectomy    . Eye surgery      There were no vitals filed for this visit.      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 Yes   Pain Score 8    Pain Location Hand   Pain Orientation Left   Pain Descriptors / Indicators Tingling;Shooting;Stabbing;Aching   Pain Type Chronic pain   Pain Onset More than a month ago   Pain Frequency Constant   Multiple Pain Sites No                      OT Treatments/Exercises (OP) - 11/15/15 0001    ADLs   ADL Comments Pt reports using right hand and avoids  left use secondary to dropping items, difficulty making a fist and "Pins and needles in those fingers" left hand.   Exercises   Exercises Hand  Left hand; Mod vc's required for followthrough/positioning   Hand Exercises   Tendon Glides Left hand PROM by therapist. Able to make flat fist after stretch x71mn unable to maintain fist actively, hold.  Pt reports increased paresthesias with HEP   Other Hand Exercises Median nerve gliding left UE x10reps   Other Hand Exercises Reviewed HEP as previously issued: hold off on sponge exercises as pt states that these cause paresthesias & increased pain left hand.   Modalities   Modalities Fluidotherapy;Ultrasound   Ultrasound   Ultrasound Location Left carpal tunnel @ 1.0 w/cm2 w/ 31m x8m62m  Ultrasound Goals Edema;Pain   LUE Fluidotherapy   Number Minutes Fluidotherapy 10 Minutes   LUE Fluidotherapy Location Hand;Wrist   Comments to assist with decreased pain, increased AROM & flexibility                OT Education - 11/15/15 1033    Education provided Yes   Education Details Median nerve glides and splinting PRN left hand, f/u w/ MD on 11/19/15.  Person(s) Educated Patient   Methods Explanation;Demonstration;Tactile cues;Verbal cues   Comprehension Verbalized understanding;Returned demonstration;Verbal cues required          OT Short Term Goals - 11/15/15 1024    OT SHORT TERM GOAL #2   Title Extention of diigits improve for pt to lay hand flat on table and reach into pocket   Baseline Extention impaired MC's -20 to -40 and PIP 's-10 to -40   Time 3   Period Weeks   Status On-going   OT SHORT TERM GOAL #3   Title Pt to be ind in HEP to decrease numbness/pins and needles  pain to less than 3/10    Baseline Eval - 10/10 at the worse - no knowledge of HEP. 11/15/15 8/10 pain, requires Mod vc's for HEP follow through and positioning.   Time 2   Period Weeks   Status Not Met           OT Long Term Goals - 11/15/15 1026     OT LONG TERM GOAL #1   Title Flexion of digits improve for pt to touch palm to hold onto tools , broom    Baseline Eval: Active Flexion MCP's 65-72*; PIP's 38-68* left hand. PROM = flat fist after stretch, pt unable to hold in this position. 11/15/15: PROM for flat fist after stretch, pt unable to hold in this position.   Time 4   Period Weeks   Status On-going               Plan - 11/15/15 1044    Clinical Impression Statement Pt continues to be limited by pain, joint stiffness and sensibility changes in left non-dominant hand. Able to make a fist after PROM/stretch but unable to actively hold this positoion for functional activity. Pt should benefit from f/u w/ MD as scheduled on 11/19/15 per his report of no significant changes in pain/symptoms at this time.    Rehab Potential Fair   OT Frequency 2x / week   OT Duration 4 weeks   OT Treatment/Interventions Self-care/ADL training;Fluidtherapy;Contrast Bath;Splinting;Patient/family education;Therapeutic exercises;Ultrasound;Passive range of motion;Manual Therapy   Plan Pt to f/u w/ MD on 11/19/15 to discuss progress and treatment options and states that he will call office with cont therapy vs go for possible CTR left per his report.   OT Home Exercise Plan see pt instruction    Consulted and Agree with Plan of Care Patient      Patient will benefit from skilled therapeutic intervention in order to improve the following deficits and impairments:  Impaired flexibility, Decreased range of motion, Decreased coordination, Impaired sensation, Impaired UE functional use, Pain, Decreased strength  Visit Diagnosis: Pain in left hand  Stiffness of left hand, not elsewhere classified  Muscle weakness (generalized)  Other disturbances of skin sensation    Problem List Patient Active Problem List   Diagnosis Date Noted  . Dehydration 08/14/2015  . Flu syndrome 08/14/2015  . AKI (acute kidney injury) ([place]) 08/14/2015  . Elevated troponin  08/14/2015  . GERD (gastroesophageal reflux disease) 08/14/2015  . Uncontrolled type 2 diabetes mellitus with chronic kidney disease ([place]) 08/13/2015  . Essential hypertension, benign 08/13/2015  . 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 Hyperlipidemia 02/11/2015  . BPH (benign prostatic hyperplasia) 02/11/2015  . Type 2 diabetes mellitus with chronic kidney disease ([place]) 02/11/2015  . Hypertensive CKD (chronic kidney disease) 02/11/2015    Almyra Deforest, OTR/L 11/15/2015, 11:32 AM  [facility] [address], [place], [address] Phone: [phone]   Fax:  [phone]  Name: Scott Lawson. MRN: [id number] Date of Birth: May 17, 1941

## 2015-11-18 ENCOUNTER — Encounter: Payer: Self-pay | Admitting: Unknown Physician Specialty

## 2015-11-18 ENCOUNTER — Ambulatory Visit (INDEPENDENT_AMBULATORY_CARE_PROVIDER_SITE_OTHER): Payer: Commercial Managed Care - HMO | Admitting: Unknown Physician Specialty

## 2015-11-18 VITALS — BP 135/76 | HR 76 | Temp 98.0°F | Ht 66.5 in | Wt 224.2 lb

## 2015-11-18 DIAGNOSIS — Z794 Long term (current) use of insulin: Secondary | ICD-10-CM

## 2015-11-18 DIAGNOSIS — E1165 Type 2 diabetes mellitus with hyperglycemia: Secondary | ICD-10-CM

## 2015-11-18 DIAGNOSIS — N182 Chronic kidney disease, stage 2 (mild): Secondary | ICD-10-CM

## 2015-11-18 DIAGNOSIS — E1122 Type 2 diabetes mellitus with diabetic chronic kidney disease: Secondary | ICD-10-CM | POA: Diagnosis not present

## 2015-11-18 DIAGNOSIS — I10 Essential (primary) hypertension: Secondary | ICD-10-CM

## 2015-11-18 DIAGNOSIS — I129 Hypertensive chronic kidney disease with stage 1 through stage 4 chronic kidney disease, or unspecified chronic kidney disease: Secondary | ICD-10-CM

## 2015-11-18 DIAGNOSIS — IMO0002 Reserved for concepts with insufficient information to code with codable children: Secondary | ICD-10-CM

## 2015-11-18 MED ORDER — OMEPRAZOLE 20 MG PO CPDR: 20.0000 mg | DELAYED_RELEASE_CAPSULE | Freq: Every day | ORAL | Status: DC

## 2015-11-18 MED ORDER — ATORVASTATIN CALCIUM 40 MG PO TABS: 40.0000 mg | ORAL_TABLET | Freq: Every day | ORAL | Status: DC

## 2015-11-18 NOTE — Assessment & Plan Note (Signed)
Stable, continue present medications.   

## 2015-11-18 NOTE — Progress Notes (Signed)
BP 135/76 mmHg  Pulse 76  Temp(Src) 98 F (36.7 C)  Ht 5' 6.5" (1.689 m)  Wt 224 lb 3.2 oz (101.696 kg)  BMI 35.65 kg/m2  SpO2 96%   Subjective:    Patient ID: Scott Roof., male    DOB: Aug 02, 1940, 75 y.o.   MRN: [id number]  HPI: Scott Lawson. is a 75 y.o. male  Chief Complaint  Patient presents with  . Hypertension  . Diabetes    pt states last eye exam date in chart is correct  . Hyperlipidemia   Pt states he went to the [facility] for his 6 month physical.  He was told there that "everything was good."  Labs were done   Diabetes: Taking 35 units of insulin Using medications without difficulties No hypoglycemic episodes No hyperglycemic episodes Feet problems: none Blood Sugars averaging: eye exam within last year Last Hgb A1C: 8  Hypertension  Using medications without difficulty Average home BPs   Using medication without problems or lightheadedness No chest pain with exertion or shortness of breath No Edema  Elevated Cholesterol Using medications without problems No Muscle aches  Diet: Working on it since classes Exercise: Works as a Psychologist, sport and exercise   Relevant past medical, surgical, family and social history reviewed and updated as indicated. Interim medical history since our last visit reviewed. Allergies and medications reviewed and updated.  Review of Systems  Per HPI unless specifically indicated above     Objective:    BP 135/76 mmHg  Pulse 76  Temp(Src) 98 F (36.7 C)  Ht 5' 6.5" (1.689 m)  Wt 224 lb 3.2 oz (101.696 kg)  BMI 35.65 kg/m2  SpO2 96%  Wt Readings from Last 3 Encounters:  11/18/15 224 lb 3.2 oz (101.696 kg)  10/08/15 218 lb 12.8 oz (99.247 kg)  08/16/15 220 lb 12.8 oz (100.154 kg)    Physical Exam  Constitutional: He is oriented to person, place, and time. He appears well-developed and well-nourished. No distress.  HENT:  Head: Normocephalic and atraumatic.  Eyes: Conjunctivae and lids are normal. Right eye  exhibits no discharge. Left eye exhibits no discharge. No scleral icterus.  Neck: Normal range of motion. Neck supple. No JVD present. Carotid bruit is not present.  Cardiovascular: Normal rate, regular rhythm and normal heart sounds.   Pulmonary/Chest: Effort normal and breath sounds normal. No respiratory distress.  Abdominal: Normal appearance. There is no splenomegaly or hepatomegaly.  Musculoskeletal: Normal range of motion.  Neurological: He is alert and oriented to person, place, and time.  Skin: Skin is warm, dry and intact. No rash noted. No pallor.  Psychiatric: He has a normal mood and affect. His behavior is normal. Judgment and thought content normal.    Results for orders placed or performed in visit on 08/16/15  Comprehensive metabolic panel  Result Value Ref Range   Glucose 322 (H) 65 - 99 mg/dL   BUN 18 8 - 27 mg/dL   Creatinine, Ser 1.30 (H) 0.76 - 1.27 mg/dL   GFR calc non Af Amer 54 (L) >59 mL/min/1.73   GFR calc Af Amer 62 >59 mL/min/1.73   BUN/Creatinine Ratio 14 10 - 22   Sodium 139 134 - 144 mmol/L   Potassium 5.0 3.5 - 5.2 mmol/L   Chloride 99 96 - 106 mmol/L   CO2 22 18 - 29 mmol/L   Calcium 9.5 8.6 - 10.2 mg/dL   Total Protein 6.8 6.0 - 8.5 g/dL   Albumin 4.0 3.5 -  4.8 g/dL   Globulin, Total 2.8 1.5 - 4.5 g/dL   Albumin/Globulin Ratio 1.4 1.1 - 2.5   Bilirubin Total 0.2 0.0 - 1.2 mg/dL   Alkaline Phosphatase 74 39 - 117 IU/L   AST 22 0 - 40 IU/L   ALT 20 0 - 44 IU/L      Assessment & Plan:   Problem List Items Addressed This Visit      Unprioritized   Essential hypertension, benign    Stable, continue present medications.        Relevant Medications   atorvastatin (LIPITOR) 40 MG tablet   Hypertensive CKD (chronic kidney disease)    Stable, continue present medications.        Uncontrolled type 2 diabetes mellitus with chronic kidney disease ([place]) - Primary    Labs just done at [facility]. He was told Hgb A1C was good.  I will try to get those  results      Relevant Medications   atorvastatin (LIPITOR) 40 MG tablet       Follow up plan: Return in about 3 months (around 02/18/2016).

## 2015-11-18 NOTE — Assessment & Plan Note (Signed)
Labs just done at [facility]. He was told Hgb A1C was good.  I will try to get those results

## 2015-11-20 ENCOUNTER — Other Ambulatory Visit: Payer: Self-pay | Admitting: Specialist

## 2015-11-20 DIAGNOSIS — G5602 Carpal tunnel syndrome, left upper limb: Secondary | ICD-10-CM | POA: Diagnosis not present

## 2015-11-21 ENCOUNTER — Telehealth: Payer: Self-pay

## 2015-11-21 NOTE — Telephone Encounter (Signed)
-----   Message from Kathrine Haddock, NP sent at 11/18/2015  9:02 AM EDT ----- He had labs and a PE done at the [place]. Can we get those records somehow? Nothing showsup on care everywhere

## 2015-11-21 NOTE — Telephone Encounter (Signed)
Tried calling patient. There was no answer and voicemail box was full so I was unable to leave a message. Will try to call again later.

## 2015-11-21 NOTE — Telephone Encounter (Signed)
Called the [place] in [place]. They gave me their fax number and asked for me to fax the signed release of information to them and then they would send [place] the records. Their fax is [phone]. Will call patient about this.

## 2015-11-22 ENCOUNTER — Ambulatory Visit: Payer: Commercial Managed Care - HMO | Admitting: Occupational Therapy

## 2015-11-22 NOTE — Telephone Encounter (Signed)
Called and spoke to patient. I explained to him what was going on and what we needed. Patient stated he would come by Monday to sign a release form.

## 2015-11-28 NOTE — Telephone Encounter (Signed)
Called and spoke to patient. Patient started telling me that he has surgery coming up for his hand and stated that he was told not to sign any release forms. I explained to the patient that all Scott Lawson was wanting was his notes and lab results from his physical he stated he had done at the [place]. Patient apologized about his hand because he stated he thought I was someone else. But patient stated he would be getting a copy of those results hopefully tomorrow and he said he would bring them buy for [place] to make a copy. I told him that would be fine.

## 2015-12-02 ENCOUNTER — Encounter
Admission: RE | Admit: 2015-12-02 | Discharge: 2015-12-02 | Disposition: A | Discharge status: Home / Self Care | Payer: Commercial Managed Care - HMO | Source: Ambulatory Visit | Attending: Specialist | Admitting: Specialist

## 2015-12-02 DIAGNOSIS — Z01812 Encounter for preprocedural laboratory examination: Secondary | ICD-10-CM | POA: Insufficient documentation

## 2015-12-02 HISTORY — DX: Gastro-esophageal reflux disease without esophagitis: K21.9

## 2015-12-02 LAB — CBC
HCT: 36.2 % — ABNORMAL LOW (ref 40.0–52.0)
HEMOGLOBIN: 11.9 g/dL — AB (ref 13.0–18.0)
MCH: 29.5 pg (ref 26.0–34.0)
MCHC: 32.8 g/dL (ref 32.0–36.0)
MCV: 90 fL (ref 80.0–100.0)
PLATELETS: 265 10*3/uL (ref 150–440)
RBC: 4.03 MIL/uL — AB (ref 4.40–5.90)
RDW: 14.6 % — ABNORMAL HIGH (ref 11.5–14.5)
WBC: 8.4 10*3/uL (ref 3.8–10.6)

## 2015-12-02 NOTE — Telephone Encounter (Signed)
Patient brought a copy of his lab results in. Will let Malachy Mood look over them and then scan them into the chart.

## 2015-12-02 NOTE — Patient Instructions (Signed)
Your procedure is scheduled on: Wednesday 12/11/15 Report to Day Surgery. 2ND FLOOR MEDICAL MALL ENTRANCE To find out your arrival time please call [phone] between 1PM - 3PM on Tuesday 12/10/15.  Remember: Instructions that are not followed completely may result in serious medical risk, up to and including death, or upon the discretion of your surgeon and anesthesiologist your surgery may need to be rescheduled.    __X__ 1. Do not eat food or drink liquids after midnight. No gum chewing or hard candies.     __X__ 2. No Alcohol for 24 hours before or after surgery.   ____ 3. Bring all medications with you on the day of surgery if instructed.    __X__ 4. Notify your doctor if there is any change in your medical condition     (cold, fever, infections).     Do not wear jewelry, make-up, hairpins, clips or nail polish.  Do not wear lotions, powders, or perfumes.   Do not shave 48 hours prior to surgery. Men may shave face and neck.  Do not bring valuables to the hospital.    [facility] is not responsible for any belongings or valuables.               Contacts, dentures or bridgework may not be worn into surgery.  Leave your suitcase in the car. After surgery it may be brought to your room.  For patients admitted to the hospital, discharge time is determined by your                treatment team.   Patients discharged the day of surgery will not be allowed to drive home.   Please read over the following fact sheets that you were given:   Surgical Site Infection Prevention   __X__ Take these medicines the morning of surgery with A SIP OF WATER:    1. ATORVASTATIN  2. FINASTERIDE  3. LISINOPRIL  4. OMEPRAZOLE  5.  6.  ____ Fleet Enema (as directed)   __X__ Use CHG Soap as directed  ____ Use inhalers on the day of surgery  __X__ Stop metformin 2 days prior to surgery    __X__ Take 1/2 of usual insulin dose the night before surgery and none on the morning of surgery.    __X__ Stop Coumadin/Plavix/aspirin on AS DIRECTED BY PHYSICIAN  ____ Stop Anti-inflammatories on    ____ Stop supplements until after surgery.    ____ Bring C-Pap to the hospital.

## 2015-12-11 ENCOUNTER — Ambulatory Visit
Admission: RE | Admit: 2015-12-11 | Discharge: 2015-12-11 | Disposition: A | Discharge status: Home / Self Care | Payer: Commercial Managed Care - HMO | Source: Ambulatory Visit | Attending: Specialist | Admitting: Specialist

## 2015-12-11 ENCOUNTER — Ambulatory Visit: Payer: Commercial Managed Care - HMO | Admitting: Anesthesiology

## 2015-12-11 ENCOUNTER — Encounter: Payer: Self-pay | Admitting: *Deleted

## 2015-12-11 ENCOUNTER — Encounter
Admission: RE | Disposition: A | Discharge status: Home / Self Care | Payer: Self-pay | Source: Ambulatory Visit | Attending: Specialist

## 2015-12-11 DIAGNOSIS — D649 Anemia, unspecified: Secondary | ICD-10-CM | POA: Diagnosis not present

## 2015-12-11 DIAGNOSIS — I10 Essential (primary) hypertension: Secondary | ICD-10-CM | POA: Insufficient documentation

## 2015-12-11 DIAGNOSIS — E669 Obesity, unspecified: Secondary | ICD-10-CM | POA: Diagnosis not present

## 2015-12-11 DIAGNOSIS — G5602 Carpal tunnel syndrome, left upper limb: Secondary | ICD-10-CM | POA: Insufficient documentation

## 2015-12-11 DIAGNOSIS — Z862 Personal history of diseases of the blood and blood-forming organs and certain disorders involving the immune mechanism: Secondary | ICD-10-CM | POA: Insufficient documentation

## 2015-12-11 DIAGNOSIS — E119 Type 2 diabetes mellitus without complications: Secondary | ICD-10-CM | POA: Diagnosis not present

## 2015-12-11 DIAGNOSIS — K219 Gastro-esophageal reflux disease without esophagitis: Secondary | ICD-10-CM | POA: Diagnosis not present

## 2015-12-11 HISTORY — PX: CARPAL TUNNEL RELEASE: SHX101

## 2015-12-11 LAB — GLUCOSE, CAPILLARY
GLUCOSE-CAPILLARY: 116 mg/dL — AB (ref 65–99)
Glucose-Capillary: 117 mg/dL — ABNORMAL HIGH (ref 65–99)

## 2015-12-11 SURGERY — CARPAL TUNNEL RELEASE
Anesthesia: General | Laterality: Left

## 2015-12-11 MED ORDER — GABAPENTIN 400 MG PO CAPS: 400.0000 mg | ORAL_CAPSULE | Freq: Three times a day (TID) | ORAL | Status: DC

## 2015-12-11 MED ORDER — MELOXICAM 15 MG PO TABS: 15.0000 mg | ORAL_TABLET | Freq: Every day | ORAL | Status: DC

## 2015-12-11 MED ORDER — BUPIVACAINE HCL 0.5 % IJ SOLN
INTRAMUSCULAR | Status: DC | PRN
Start: 2015-12-11 — End: 2015-12-11
  Administered 2015-12-11: 18 mL

## 2015-12-11 MED ORDER — CLINDAMYCIN PHOSPHATE 900 MG/50ML IV SOLN: 900.0000 mg | Freq: Once | INTRAVENOUS | Status: DC

## 2015-12-11 MED ORDER — CLINDAMYCIN PHOSPHATE 900 MG/50ML IV SOLN
INTRAVENOUS | Status: AC
  Administered 2015-12-11: 900 mg via INTRAVENOUS
  Filled 2015-12-11: qty 50

## 2015-12-11 MED ORDER — HYDROCODONE-ACETAMINOPHEN 5-325 MG PO TABS: 1.0000 | ORAL_TABLET | Freq: Four times a day (QID) | ORAL | Status: DC | PRN

## 2015-12-11 MED ORDER — ONDANSETRON HCL 4 MG/2ML IJ SOLN
INTRAMUSCULAR | Status: DC | PRN
  Administered 2015-12-11: 4 mg via INTRAVENOUS

## 2015-12-11 MED ORDER — FENTANYL CITRATE (PF) 100 MCG/2ML IJ SOLN: 25.0000 ug | INTRAMUSCULAR | Status: DC | PRN

## 2015-12-11 MED ORDER — ONDANSETRON HCL 4 MG/2ML IJ SOLN: 4.0000 mg | Freq: Once | INTRAMUSCULAR | Status: DC | PRN

## 2015-12-11 MED ORDER — MELOXICAM 7.5 MG PO TABS
ORAL_TABLET | ORAL | Status: AC
  Administered 2015-12-11: 15 mg via ORAL
  Filled 2015-12-11: qty 2

## 2015-12-11 MED ORDER — PROPOFOL 10 MG/ML IV BOLUS
INTRAVENOUS | Status: DC | PRN
  Administered 2015-12-11: 150 mg via INTRAVENOUS

## 2015-12-11 MED ORDER — CEFAZOLIN SODIUM-DEXTROSE 2-4 GM/100ML-% IV SOLN
INTRAVENOUS | Status: AC
  Filled 2015-12-11: qty 100

## 2015-12-11 MED ORDER — CEFAZOLIN SODIUM-DEXTROSE 2-4 GM/100ML-% IV SOLN
2.0000 g | INTRAVENOUS | Status: AC
  Administered 2015-12-11: 2 g via INTRAVENOUS

## 2015-12-11 MED ORDER — MIDAZOLAM HCL 2 MG/2ML IJ SOLN
INTRAMUSCULAR | Status: DC | PRN
  Administered 2015-12-11: 2 mg via INTRAVENOUS

## 2015-12-11 MED ORDER — GABAPENTIN 400 MG PO CAPS
ORAL_CAPSULE | ORAL | Status: AC
  Administered 2015-12-11: 400 mg via ORAL
  Filled 2015-12-11: qty 1

## 2015-12-11 MED ORDER — SODIUM CHLORIDE 0.9 % IR SOLN
Status: DC | PRN
  Administered 2015-12-11: 10 mL

## 2015-12-11 MED ORDER — GABAPENTIN 400 MG PO CAPS
400.0000 mg | ORAL_CAPSULE | Freq: Once | ORAL | Status: AC
  Administered 2015-12-11: 400 mg via ORAL

## 2015-12-11 MED ORDER — SODIUM CHLORIDE 0.9 % IV SOLN
INTRAVENOUS | Status: DC
  Administered 2015-12-11: 11:00:00 via INTRAVENOUS

## 2015-12-11 MED ORDER — CHLORHEXIDINE GLUCONATE CLOTH 2 % EX PADS: 6.0000 | MEDICATED_PAD | Freq: Once | CUTANEOUS | Status: DC

## 2015-12-11 MED ORDER — FENTANYL CITRATE (PF) 100 MCG/2ML IJ SOLN
INTRAMUSCULAR | Status: DC | PRN
  Administered 2015-12-11 (×2): 50 ug via INTRAVENOUS

## 2015-12-11 MED ORDER — LIDOCAINE HCL (CARDIAC) 20 MG/ML IV SOLN
INTRAVENOUS | Status: DC | PRN
  Administered 2015-12-11: 100 mg via INTRAVENOUS

## 2015-12-11 MED ORDER — MELOXICAM 7.5 MG PO TABS
15.0000 mg | ORAL_TABLET | Freq: Once | ORAL | Status: AC
  Administered 2015-12-11: 15 mg via ORAL

## 2015-12-11 MED ORDER — BUPIVACAINE HCL (PF) 0.5 % IJ SOLN
INTRAMUSCULAR | Status: AC
  Filled 2015-12-11: qty 30

## 2015-12-11 SURGICAL SUPPLY — 27 items
BLADE SURG MINI STRL (BLADE) ×3 IMPLANT
BNDG ESMARK 4X12 TAN STRL LF (GAUZE/BANDAGES/DRESSINGS) ×3 IMPLANT
CANISTER SUCT 1200ML W/VALVE (MISCELLANEOUS) ×3 IMPLANT
CHLORAPREP W/TINT 26ML (MISCELLANEOUS) ×3 IMPLANT
CUFF TOURN 18 STER (MISCELLANEOUS) IMPLANT
ELECT REM PT RETURN 9FT ADLT (ELECTROSURGICAL) ×3
ELECTRODE REM PT RTRN 9FT ADLT (ELECTROSURGICAL) ×1 IMPLANT
GAUZE FLUFF 18X24 1PLY STRL (GAUZE/BANDAGES/DRESSINGS) ×6 IMPLANT
GAUZE PETRO XEROFOAM 1X8 (MISCELLANEOUS) ×3 IMPLANT
GLOVE BIO SURGEON STRL SZ7 (GLOVE) ×3 IMPLANT
GLOVE BIO SURGEON STRL SZ8 (GLOVE) ×3 IMPLANT
GLOVE INDICATOR 7.5 STRL GRN (GLOVE) ×3 IMPLANT
GOWN STRL REUS W/ TWL LRG LVL3 (GOWN DISPOSABLE) ×2 IMPLANT
GOWN STRL REUS W/TWL LRG LVL3 (GOWN DISPOSABLE) ×4
KIT RM TURNOVER STRD PROC AR (KITS) ×3 IMPLANT
NS IRRIG 500ML POUR BTL (IV SOLUTION) ×3 IMPLANT
PACK EXTREMITY ARMC (MISCELLANEOUS) ×3 IMPLANT
PAD PREP 24X41 OB/GYN DISP (PERSONAL CARE ITEMS) ×3 IMPLANT
PADDING CAST 4IN STRL (MISCELLANEOUS) ×2
PADDING CAST BLEND 4X4 STRL (MISCELLANEOUS) ×1 IMPLANT
SPLINT CAST 1 STEP 3X12 (MISCELLANEOUS) ×3 IMPLANT
STOCKINETTE BIAS CUT 4 980044 (GAUZE/BANDAGES/DRESSINGS) ×3 IMPLANT
STOCKINETTE STRL 4IN 9604848 (GAUZE/BANDAGES/DRESSINGS) ×3 IMPLANT
SUT ETHILON 4-0 (SUTURE)
SUT ETHILON 4-0 FS2 18XMFL BLK (SUTURE)
SUT ETHILON 5-0 FS-2 18 BLK (SUTURE) ×3 IMPLANT
SUTURE ETHLN 4-0 FS2 18XMF BLK (SUTURE) IMPLANT

## 2015-12-11 NOTE — Anesthesia Preprocedure Evaluation (Addendum)
Anesthesia Evaluation  Patient identified by MRN, date of birth, ID band Patient awake    Reviewed: Allergy & Precautions, NPO status , Patient's Chart, lab work & pertinent test results, reviewed documented beta blocker date and time   Airway Mallampati: III  TM Distance: >3 FB     Dental  (+) Chipped   Pulmonary           Cardiovascular hypertension, Pt. on medications      Neuro/Psych    GI/Hepatic GERD  Controlled,  Endo/Other  diabetes, Type 2  Renal/GU Renal InsufficiencyRenal disease     Musculoskeletal   Abdominal   Peds  Hematology  (+) anemia ,   Anesthesia Other Findings Obesity. Hb 11.9.  Reproductive/Obstetrics                            Anesthesia Physical Anesthesia Plan  ASA: III  Anesthesia Plan: General   Post-op Pain Management:    Induction: Intravenous  Airway Management Planned: LMA  Additional Equipment:   Intra-op Plan:   Post-operative Plan:   Informed Consent: I have reviewed the patients History and Physical, chart, labs and discussed the procedure including the risks, benefits and alternatives for the proposed anesthesia with the patient or authorized representative who has indicated his/her understanding and acceptance.     Plan Discussed with: CRNA  Anesthesia Plan Comments:         Anesthesia Quick Evaluation

## 2015-12-11 NOTE — Anesthesia Postprocedure Evaluation (Signed)
Anesthesia Post Note  Patient: Scott Lawson.  Procedure(s) Performed: Procedure(s) (LRB): CARPAL TUNNEL RELEASE (Left)  Patient location during evaluation: PACU Anesthesia Type: General Level of consciousness: awake and alert Pain management: pain level controlled Vital Signs Assessment: post-procedure vital signs reviewed and stable Respiratory status: spontaneous breathing, nonlabored ventilation, respiratory function stable and patient connected to nasal cannula oxygen Cardiovascular status: blood pressure returned to baseline and stable Postop Assessment: no signs of nausea or vomiting Anesthetic complications: no    Last Vitals:  Filed Vitals:   12/11/15 1216 12/11/15 1246  BP: 134/81 132/88  Pulse: 59 57  Temp: 36.4 C   Resp: 16 16    Last Pain:  Filed Vitals:   12/11/15 1248  PainSc: 0-No pain                 Laticha Ferrucci S

## 2015-12-11 NOTE — Op Note (Signed)
12/11/2015  11:33 AM  PATIENT:  Scott Lawson.    PRE-OPERATIVE DIAGNOSIS: LEFT CARPAL TUNNEL SYNDROME POST-OPERATIVE DIAGNOSIS: LEFT CARPAL TUNNEL SYNDROME  PROCEDURE:  LEFT CARPAL TUNNEL RELEASE  SURGEON: Park Breed, MD  TOURNIQUET TIME: 17 min    ANESTHESIA:   General  PREOPERATIVE INDICATIONS:  Scott Lawson. is a  75 y.o. male with a diagnosis of right carpal tunnel syndrome who failed conservative measures and elected for surgical management.    The risks benefits and alternatives were discussed with the patient preoperatively including but not limited to the risks of infection, bleeding, nerve injury, incomplete relief of symptoms, pillar pain, cardiopulmonary complications, the need for revision surgery, among others, and the patient was willing to proceed.  OPERATIVE FINDINGS: Thickened volar ligament and nerve compression.  OPERATIVE PROCEDURE: The patient is brought to the operating room placed in the supine position. General anesthesia was administered. The left upper extremity was prepped and draped in usual sterile fashion. Time out was performed. The arm was elevated and exsanguinated and the tourniquet was inflated. Incision was made in line with the radial border of the ring finger. The carpal tunnel transverse fascia was identified, cleaned, and incised sharply. The common sensory branches were visualized along with the superficial palmar arch and protected.  The median nerve was protected below. A Kelly clamp was placed underneath the transverse carpal ligament, protecting the nerve. I released the ligament completely, and then released the proximal distal volar forearm fascia. The nerve was identified, and visualized, and protected throughout the case. The motor branch was intact upon inspection.  No masses or abnormalities were identified in ulnar bursa.  The wounds were irrigated copiously, and the wounds injected, and the skin closed with nylon  followed by a volar splint and sterile gauze. Sponge and needle counts were correct.  The patient tolerated this well, with no complications. Awakened and taken to recovery in good condition.

## 2015-12-11 NOTE — Discharge Instructions (Signed)
AMBULATORY SURGERY  °DISCHARGE INSTRUCTIONS ° ° °1) The drugs that you were given will stay in your system until tomorrow so for the next 24 hours you should not: ° °A) Drive an automobile °B) Make any legal decisions °C) Drink any alcoholic beverage ° ° °2) You may resume regular meals tomorrow.  Today it is better to start with liquids and gradually work up to solid foods. ° °You may eat anything you prefer, but it is better to start with liquids, then soup and crackers, and gradually work up to solid foods. ° ° °3) Please notify your doctor immediately if you have any unusual bleeding, trouble breathing, redness and pain at the surgery site, drainage, fever, or pain not relieved by medication. ° ° ° °4) Additional Instructions: ° ° ° ° ° ° ° °Please contact your physician with any problems or Same Day Surgery at [phone], Monday through Friday 6 am to 4 pm, or [place] [facility] [place] [facility] number at [phone]. °

## 2015-12-11 NOTE — H&P (Signed)
THE PATIENT WAS SEEN PRIOR TO SURGERY TODAY.  HISTORY, ALLERGIES, HOME MEDICATIONS AND OPERATIVE PROCEDURE WERE REVIEWED. RISKS AND BENEFITS OF SURGERY DISCUSSED WITH PATIENT AGAIN.  NO CHANGES FROM INITIAL HISTORY AND PHYSICAL NOTED.    

## 2015-12-11 NOTE — OR Nursing (Signed)
Moves fingers on left hand well and fingers warm to touch elevated on 2 pillows and ice pack in place

## 2015-12-11 NOTE — Anesthesia Procedure Notes (Signed)
Procedure Name: LMA Insertion Date/Time: 12/11/2015 10:51 AM Performed by: Aline Brochure Pre-anesthesia Checklist: Patient identified, Emergency Drugs available, Suction available and Patient being monitored Patient Re-evaluated:Patient Re-evaluated prior to inductionOxygen Delivery Method: Circle system utilized Preoxygenation: Pre-oxygenation with 100% oxygen Intubation Type: IV induction Ventilation: Mask ventilation without difficulty LMA: LMA inserted LMA Size: 4.5 Number of attempts: 1 Placement Confirmation: positive ETCO2 and breath sounds checked- equal and bilateral Tube secured with: Tape Dental Injury: Teeth and Oropharynx as per pre-operative assessment

## 2015-12-11 NOTE — Transfer of Care (Signed)
Immediate Anesthesia Transfer of Care Note  Patient: Scott Lawson.  Procedure(s) Performed: Procedure(s): CARPAL TUNNEL RELEASE (Left)  Patient Location: PACU  Anesthesia Type:General  Level of Consciousness: awake  Airway & Oxygen Therapy: Patient Spontanous Breathing and Patient connected to face mask oxygen  Post-op Assessment: Post -op Vital signs reviewed and stable  Post vital signs: stable  Last Vitals:  Filed Vitals:   12/11/15 0940 12/11/15 1134  BP: 165/91 141/82  Pulse: 65 69  Temp: 36.6 C   Resp: 16     Last Pain: There were no vitals filed for this visit.       Complications: No apparent anesthesia complications

## 2015-12-13 DIAGNOSIS — G5602 Carpal tunnel syndrome, left upper limb: Secondary | ICD-10-CM | POA: Diagnosis not present

## 2016-01-28 ENCOUNTER — Ambulatory Visit: Payer: Commercial Managed Care - HMO | Admitting: Unknown Physician Specialty

## 2016-02-14 ENCOUNTER — Telehealth: Payer: Self-pay

## 2016-02-14 NOTE — Telephone Encounter (Signed)
[facility] called stated they need a referral for this patient as he has [employer]. The required information is as follows:   Scott Lawson DOB: 10/22/1940  Dr. Earnestine Leys NPI # [id number] Diagnosis Code: G56.02  Appointment information:  02/18/16 @ 9:15am   Thanks!

## 2016-03-03 ENCOUNTER — Encounter: Payer: Self-pay | Admitting: Unknown Physician Specialty

## 2016-03-03 ENCOUNTER — Other Ambulatory Visit: Payer: Self-pay

## 2016-03-03 ENCOUNTER — Ambulatory Visit (INDEPENDENT_AMBULATORY_CARE_PROVIDER_SITE_OTHER): Payer: Commercial Managed Care - HMO | Admitting: Unknown Physician Specialty

## 2016-03-03 VITALS — BP 139/75 | HR 67 | Temp 98.2°F | Ht 67.5 in | Wt 222.2 lb

## 2016-03-03 DIAGNOSIS — Z23 Encounter for immunization: Secondary | ICD-10-CM | POA: Diagnosis not present

## 2016-03-03 DIAGNOSIS — N410 Acute prostatitis: Secondary | ICD-10-CM

## 2016-03-03 DIAGNOSIS — I10 Essential (primary) hypertension: Secondary | ICD-10-CM | POA: Diagnosis not present

## 2016-03-03 DIAGNOSIS — N182 Chronic kidney disease, stage 2 (mild): Secondary | ICD-10-CM

## 2016-03-03 DIAGNOSIS — E785 Hyperlipidemia, unspecified: Secondary | ICD-10-CM

## 2016-03-03 DIAGNOSIS — R319 Hematuria, unspecified: Secondary | ICD-10-CM | POA: Diagnosis not present

## 2016-03-03 DIAGNOSIS — E1122 Type 2 diabetes mellitus with diabetic chronic kidney disease: Secondary | ICD-10-CM | POA: Diagnosis not present

## 2016-03-03 DIAGNOSIS — N3090 Cystitis, unspecified without hematuria: Secondary | ICD-10-CM

## 2016-03-03 DIAGNOSIS — Z794 Long term (current) use of insulin: Secondary | ICD-10-CM | POA: Diagnosis not present

## 2016-03-03 LAB — HEMOGLOBIN A1C: Hemoglobin A1C: 7.1

## 2016-03-03 MED ORDER — SULFAMETHOXAZOLE-TRIMETHOPRIM 800-160 MG PO TABS
1.0000 | ORAL_TABLET | Freq: Two times a day (BID) | ORAL | 0 refills | Status: DC
Start: 2016-03-03 — End: 2016-03-09

## 2016-03-03 NOTE — Patient Instructions (Signed)

## 2016-03-03 NOTE — Assessment & Plan Note (Addendum)
LDL is 72.  Continue present medications.

## 2016-03-03 NOTE — Progress Notes (Signed)
BP 139/75 (BP Location: Right Arm, Patient Position: Sitting, Cuff Size: Large)   Pulse 67   Temp 98.2 F (36.8 C)   Ht 5' 7.5" (1.715 m)   Wt 222 lb 3.2 oz (100.8 kg)   SpO2 95%   BMI 34.29 kg/m    Subjective:    Patient ID: Scott Roof., male    DOB: [birth year], 75 y.o.   MRN: [id number]  HPI: Scott Felger. is a 75 y.o. male  Chief Complaint  Patient presents with  . Urinary Tract Infection    pt states he has been having some blood in his urine and pain with urination. states this started last Tuesday  . Diabetes  . Hyperlipidemia  . Hypertension   Diabetes: Using medications without difficulties.  Taking 36 units of Lantus nightly and sometimes has a snack if blood sugar is 120-130   No hypoglycemic episodes: see above No hyperglycemic episodes Feet problems: none Blood Sugars averaging: Less than 100 in the AM.   eye exam within last year Last Hgb A1C: 8.1  Hypertension  Using medications without difficulty Average home BPs: Not checking   Using medication without problems or lightheadedness No chest pain with exertion or shortness of breath No Edema  Elevated Cholesterol Using medications without problems No Muscle aches  Diet: Watches what he eats.  Recently avoided processed meat.   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  History of BPH and prostatitis.   Has been under the care of a Urologist.     Relevant past medical, surgical, family and social history reviewed and updated as indicated. Interim medical history since our last visit reviewed. Allergies and medications reviewed and updated.  Review of Systems  Per HPI unless specifically indicated above     Objective:    BP 139/75 (BP Location: Right Arm, Patient Position: Sitting, Cuff Size: Large)   Pulse 67   Temp 98.2 F (36.8 C)   Ht 5' 7.5" (1.715 m)   Wt 222 lb 3.2 oz (100.8 kg)   SpO2 95%   BMI  34.29 kg/m   Wt Readings from Last 3 Encounters:  03/03/16 222 lb 3.2 oz (100.8 kg)  12/02/15 224 lb (101.6 kg)  11/18/15 224 lb 3.2 oz (101.7 kg)    Physical Exam  Constitutional: He is oriented to person, place, and time. He appears well-developed and well-nourished. No distress.  HENT:  Head: Normocephalic and atraumatic.  Eyes: Conjunctivae and lids are normal. Right eye exhibits no discharge. Left eye exhibits no discharge. No scleral icterus.  Neck: Normal range of motion. Neck supple. No JVD present. Carotid bruit is not present.  Cardiovascular: Normal rate, regular rhythm and normal heart sounds.   Pulmonary/Chest: Effort normal and breath sounds normal. No respiratory distress.  Abdominal: Normal appearance. There is no splenomegaly or hepatomegaly.  Musculoskeletal: Normal range of motion.  Neurological: He is alert and oriented to person, place, and time.  Skin: Skin is warm, dry and intact. No rash noted. No pallor.  Psychiatric: He has a normal mood and affect. His behavior is normal. Judgment and thought content normal.   Refusing prostate exam Urine is positive  Results for orders placed or performed during the hospital encounter of 12/11/15  Glucose, capillary  Result Value Ref Range   Glucose-Capillary 117 (H) 65 - 99 mg/dL  Glucose, capillary  Result Value Ref Range   Glucose-Capillary 116 (H)  65 - 99 mg/dL      Assessment & Plan:   Problem List Items Addressed This Visit      Unprioritized   Essential hypertension, benign    Stable, continue present medications.        Hyperlipidemia    LDL is 72.  Continue present medications.        Type 2 diabetes mellitus with chronic kidney disease (HCC)    Hgb A1C is 7.1 and much improved. Continue present medications.        Relevant Orders   Comprehensive metabolic panel   Bayer DCA Hb A1c Waived   Lipid Panel [place], [place]    Other Visit Diagnoses    Blood in urine    -  Primary   Relevant  Orders   UA/M w/rflx Culture, Routine   Immunization due       Relevant Orders   Flu vaccine HIGH DOSE PF (Completed)   Acute prostatitis       Cystitis          Pt with cystitis probably related to BPH.  Start Septra to take BID for 4 weeks.  If recurrent symptoms, refer to urology  Follow up plan: Return in about 3 months (around 06/02/2016).

## 2016-03-03 NOTE — Assessment & Plan Note (Signed)
Stable, continue present medications.   

## 2016-03-03 NOTE — Assessment & Plan Note (Addendum)
Hgb A1C is 7.1 and much improved. Continue present medications.

## 2016-03-04 LAB — COMPREHENSIVE METABOLIC PANEL
ALBUMIN: 3.8 g/dL (ref 3.5–4.8)
ALK PHOS: 85 IU/L (ref 39–117)
ALT: 13 IU/L (ref 0–44)
AST: 13 IU/L (ref 0–40)
Albumin/Globulin Ratio: 1.2 (ref 1.2–2.2)
BUN / CREAT RATIO: 17 (ref 10–24)
BUN: 20 mg/dL (ref 8–27)
Bilirubin Total: 0.3 mg/dL (ref 0.0–1.2)
CALCIUM: 9.4 mg/dL (ref 8.6–10.2)
CO2: 25 mmol/L (ref 18–29)
CREATININE: 1.15 mg/dL (ref 0.76–1.27)
Chloride: 102 mmol/L (ref 96–106)
GFR, EST AFRICAN AMERICAN: 72 mL/min/{1.73_m2} (ref >59)
GFR, EST NON AFRICAN AMERICAN: 62 mL/min/{1.73_m2} (ref >59)
GLOBULIN, TOTAL: 3.1 g/dL (ref 1.5–4.5)
GLUCOSE: 134 mg/dL — AB (ref 65–99)
Potassium: 4.3 mmol/L (ref 3.5–5.2)
SODIUM: 142 mmol/L (ref 134–144)
TOTAL PROTEIN: 6.9 g/dL (ref 6.0–8.5)

## 2016-03-08 LAB — UA/M W/RFLX CULTURE, ROUTINE
BILIRUBIN UA: NEGATIVE
KETONES UA: NEGATIVE
Nitrite, UA: POSITIVE — AB
SPEC GRAV UA: 1.015 (ref 1.005–1.030)
UUROB: 1 mg/dL (ref 0.2–1.0)
pH, UA: 5.5 (ref 5.0–7.5)

## 2016-03-08 LAB — LIPID PANEL PICCOLO, WAIVED
CHOLESTEROL PICCOLO, WAIVED: 136 mg/dL (ref <200)
Chol/HDL Ratio Piccolo,Waive: 3.6 mg/dL
HDL CHOL PICCOLO, WAIVED: 38 mg/dL — AB (ref >59)
LDL Chol Calc Piccolo Waived: 72 mg/dL (ref <100)
Triglycerides Piccolo,Waived: 132 mg/dL (ref <150)
VLDL CHOL CALC PICCOLO,WAIVE: 26 mg/dL (ref <30)

## 2016-03-08 LAB — MICROSCOPIC EXAMINATION

## 2016-03-08 LAB — URINE CULTURE, REFLEX

## 2016-03-08 LAB — BAYER DCA HB A1C WAIVED: HB A1C (BAYER DCA - WAIVED): 7.1 % — ABNORMAL HIGH (ref <7.0)

## 2016-03-09 ENCOUNTER — Telehealth: Payer: Self-pay | Admitting: Unknown Physician Specialty

## 2016-03-09 MED ORDER — CIPROFLOXACIN HCL 500 MG PO TABS: 500.0000 mg | ORAL_TABLET | Freq: Two times a day (BID) | ORAL | 0 refills | Status: DC

## 2016-03-09 NOTE — Telephone Encounter (Signed)
Called and let patient know that new rx was sent in for him.

## 2016-03-09 NOTE — Telephone Encounter (Signed)
OK.  Rx sent in for Cipro

## 2016-03-09 NOTE — Telephone Encounter (Signed)
Called and spoke to patient. He stated that he checked his BS before he ate lunch on Friday and it was 120. He states he took the medication, Bactrim and then a few hours later and his BS was 445. He stated that this same thing happened when he tried to take it again on Saturday. Patient states he called [facility] and they advised him to stop the medication and contact us. He states that this really scared him because his Bs has not been this high in a few years. Patient would like something different sent in to [facility] in [place].

## 2016-06-02 ENCOUNTER — Ambulatory Visit (INDEPENDENT_AMBULATORY_CARE_PROVIDER_SITE_OTHER): Payer: Commercial Managed Care - HMO | Admitting: Unknown Physician Specialty

## 2016-06-02 ENCOUNTER — Encounter: Payer: Self-pay | Admitting: Unknown Physician Specialty

## 2016-06-02 VITALS — BP 151/78 | HR 69 | Temp 97.7°F | Wt 232.2 lb

## 2016-06-02 DIAGNOSIS — E1122 Type 2 diabetes mellitus with diabetic chronic kidney disease: Secondary | ICD-10-CM | POA: Diagnosis not present

## 2016-06-02 DIAGNOSIS — Z794 Long term (current) use of insulin: Secondary | ICD-10-CM

## 2016-06-02 DIAGNOSIS — N182 Chronic kidney disease, stage 2 (mild): Secondary | ICD-10-CM

## 2016-06-02 DIAGNOSIS — I10 Essential (primary) hypertension: Secondary | ICD-10-CM

## 2016-06-02 DIAGNOSIS — R351 Nocturia: Secondary | ICD-10-CM

## 2016-06-02 DIAGNOSIS — E782 Mixed hyperlipidemia: Secondary | ICD-10-CM | POA: Diagnosis not present

## 2016-06-02 DIAGNOSIS — I129 Hypertensive chronic kidney disease with stage 1 through stage 4 chronic kidney disease, or unspecified chronic kidney disease: Secondary | ICD-10-CM

## 2016-06-02 LAB — UA/M W/RFLX CULTURE, ROUTINE
Bilirubin, UA: NEGATIVE
KETONES UA: NEGATIVE
LEUKOCYTES UA: NEGATIVE
Nitrite, UA: NEGATIVE
Protein, UA: NEGATIVE
RBC UA: NEGATIVE
SPEC GRAV UA: 1.01 (ref 1.005–1.030)
UUROB: 0.2 mg/dL (ref 0.2–1.0)
pH, UA: 5.5 (ref 5.0–7.5)

## 2016-06-02 LAB — MICROALBUMIN, URINE WAIVED
CREATININE, URINE WAIVED: 50 mg/dL (ref 10–300)
Microalb, Ur Waived: 10 mg/L (ref 0–19)

## 2016-06-02 LAB — BAYER DCA HB A1C WAIVED: HB A1C (BAYER DCA - WAIVED): 7.4 % — ABNORMAL HIGH (ref <7.0)

## 2016-06-02 MED ORDER — TAMSULOSIN HCL 0.4 MG PO CAPS: 0.4000 mg | ORAL_CAPSULE | Freq: Every day | ORAL | 3 refills | Status: DC

## 2016-06-02 NOTE — Assessment & Plan Note (Signed)
Stable, continue present medications.   

## 2016-06-02 NOTE — Assessment & Plan Note (Signed)
Hgb A1C is 7.4%.  With good blood sugars during the day will not make any changes

## 2016-06-02 NOTE — Progress Notes (Signed)
BP (!) 151/78 (BP Location: Left Arm, Cuff Size: Large)   Pulse 69   Temp 97.7 F (36.5 C)   Wt 232 lb 3.2 oz (105.3 kg)   SpO2 95%   BMI 35.83 kg/m    Subjective:    Patient ID: Scott Roof., male    DOB: 03-27-1941, 75 y.o.   MRN: [id number]  HPI: Scott Culverhouse. is a 75 y.o. male  Chief Complaint  Patient presents with  . Diabetes  . Hyperlipidemia  . Hypertension   Diabetes: Using medications without difficulties.  Taking 36 units of Lantus nightly.    No hypoglycemic episodes: see above No hyperglycemic episodes Feet problems: none Blood Sugars averaging: 90 to 105 int the AM.  Highest is 125 at night eye exam within last year: no Last Hgb A1C: 7.1  Hypertension  Using medications without difficulty Average home BPs: Not checking                   Using medication without problems or lightheadedness No chest pain with exertion or shortness of breath No Edema  Elevated Cholesterol Using medications without problems No Muscle aches  Diet: Watches what he eats.  Recently avoided processed meat.   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  He would like that to stop.  He has seen Urology.    Relevant past medical, surgical, family and social history reviewed and updated as indicated. Interim medical history since our last visit reviewed. Allergies and medications reviewed and updated.  Review of Systems  Per HPI unless specifically indicated above     Objective:    BP (!) 151/78 (BP Location: Left Arm, Cuff Size: Large)   Pulse 69   Temp 97.7 F (36.5 C)   Wt 232 lb 3.2 oz (105.3 kg)   SpO2 95%   BMI 35.83 kg/m   Wt Readings from Last 3 Encounters:  06/02/16 232 lb 3.2 oz (105.3 kg)  03/03/16 222 lb 3.2 oz (100.8 kg)  12/02/15 224 lb (101.6 kg)    Physical Exam  Constitutional: He is oriented to person, place, and time. He appears well-developed and  well-nourished. No distress.  HENT:  Head: Normocephalic and atraumatic.  Eyes: Conjunctivae and lids are normal. Right eye exhibits no discharge. Left eye exhibits no discharge. No scleral icterus.  Neck: Normal range of motion. Neck supple. No JVD present. Carotid bruit is not present.  Cardiovascular: Normal rate, regular rhythm and normal heart sounds.   Pulmonary/Chest: Effort normal and breath sounds normal. No respiratory distress.  Abdominal: Normal appearance. There is no splenomegaly or hepatomegaly.  Musculoskeletal: Normal range of motion.  Neurological: He is alert and oriented to person, place, and time.  Skin: Skin is warm, dry and intact. No rash noted. No pallor.  Psychiatric: He has a normal mood and affect. His behavior is normal. Judgment and thought content normal.    Results for orders placed or performed in visit on 03/03/16  Hemoglobin A1c  Result Value Ref Range   Hemoglobin A1C 7.1%       Assessment & Plan:   Problem List Items Addressed This Visit      Unprioritized   CKD (chronic kidney disease), stage II - Primary   Essential hypertension, benign    Stable, continue present medications.        Hyperlipidemia    Stable, continue present medications.  Relevant Orders   Lipid Panel w/o Chol/HDL Ratio   Hypertensive CKD (chronic kidney disease)   Relevant Orders   Comprehensive metabolic panel   Nocturia    Start Flomax      Relevant Orders   PSA   UA/M w/rflx Culture, Routine   Type 2 diabetes mellitus with chronic kidney disease (HCC)    Hgb A1C is 7.4%.  With good blood sugars during the day will not make any changes      Relevant Orders   Bayer DCA Hb A1c Waived   Comprehensive metabolic panel   Microalbumin, Urine Waived       Follow up plan: Return in about 3 months (around 08/31/2016).

## 2016-06-02 NOTE — Assessment & Plan Note (Signed)
Start Flomax

## 2016-06-03 LAB — COMPREHENSIVE METABOLIC PANEL
ALT: 18 IU/L (ref 0–44)
AST: 16 IU/L (ref 0–40)
Albumin/Globulin Ratio: 2 (ref 1.2–2.2)
Albumin: 4.3 g/dL (ref 3.5–4.8)
Alkaline Phosphatase: 62 IU/L (ref 39–117)
BILIRUBIN TOTAL: 0.6 mg/dL (ref 0.0–1.2)
BUN/Creatinine Ratio: 12 (ref 10–24)
BUN: 13 mg/dL (ref 8–27)
CALCIUM: 9.3 mg/dL (ref 8.6–10.2)
CHLORIDE: 100 mmol/L (ref 96–106)
CO2: 24 mmol/L (ref 18–29)
Creatinine, Ser: 1.06 mg/dL (ref 0.76–1.27)
GFR calc non Af Amer: 69 mL/min/{1.73_m2} (ref >59)
GFR, EST AFRICAN AMERICAN: 80 mL/min/{1.73_m2} (ref >59)
GLUCOSE: 176 mg/dL — AB (ref 65–99)
Globulin, Total: 2.2 g/dL (ref 1.5–4.5)
Potassium: 4.2 mmol/L (ref 3.5–5.2)
Sodium: 141 mmol/L (ref 134–144)
TOTAL PROTEIN: 6.5 g/dL (ref 6.0–8.5)

## 2016-06-03 LAB — LIPID PANEL W/O CHOL/HDL RATIO
Cholesterol, Total: 203 mg/dL — ABNORMAL HIGH (ref 100–199)
HDL: 48 mg/dL (ref >39)
LDL CALC: 121 mg/dL — AB (ref 0–99)
TRIGLYCERIDES: 169 mg/dL — AB (ref 0–149)
VLDL Cholesterol Cal: 34 mg/dL (ref 5–40)

## 2016-06-03 LAB — PSA: Prostate Specific Ag, Serum: 1.9 ng/mL (ref 0.0–4.0)

## 2016-06-04 ENCOUNTER — Telehealth: Payer: Self-pay | Admitting: Unknown Physician Specialty

## 2016-06-04 NOTE — Telephone Encounter (Signed)
Patient called stating Malachy Mood was going to call him in a medication to try for his prostate yesterday to [facility] but per the pharmacy they never received it.  Thank You  Santiago Glad

## 2016-06-04 NOTE — Telephone Encounter (Signed)
Called pharmacy to see if medication was already sent. I spoke with Raquel Sarna and she stated that the medication was sent yesterday, 06/03/16.

## 2016-06-04 NOTE — Telephone Encounter (Signed)
Called and let patient know that rx was sent to [facility] and that it is already on its way to him.

## 2016-06-04 NOTE — Telephone Encounter (Signed)
RX was sent to [facility]. Tried calling patient, no answer and no VM. Will try to call again later.

## 2016-06-17 ENCOUNTER — Telehealth: Payer: Self-pay

## 2016-06-17 NOTE — Telephone Encounter (Signed)
Called and spoke to patient because we received a refill request for meloxicam which is listed in historical medications. Patient stated that he is still taking this medication for his hand (written back in June for carpal tunnel). Pharmacy requests that a 90 day supply be sent in and pharmacy is [facility].

## 2016-06-17 NOTE — Telephone Encounter (Signed)
This is not a safe medication for chronic use.  It' is OK for occasional.  I think Orthopedics prescribed it.  Would he like a referral?

## 2016-06-19 NOTE — Telephone Encounter (Signed)
Tried calling patient, no answer and no VM set up. Will try to call again later.  

## 2016-06-19 NOTE — Telephone Encounter (Signed)
Patient returned my call. I let him know what Malachy Mood said. Patient stated he did not want a referral to orthopedics and that the [facility] was trying to figure something out for him now. I asked for the patient to give [place] a call if he needed anything.

## 2016-07-29 ENCOUNTER — Ambulatory Visit (INDEPENDENT_AMBULATORY_CARE_PROVIDER_SITE_OTHER): Payer: Medicare HMO | Admitting: Unknown Physician Specialty

## 2016-07-29 ENCOUNTER — Encounter: Payer: Self-pay | Admitting: Unknown Physician Specialty

## 2016-07-29 VITALS — BP 117/79 | HR 73 | Temp 98.0°F | Wt 227.2 lb

## 2016-07-29 DIAGNOSIS — K219 Gastro-esophageal reflux disease without esophagitis: Secondary | ICD-10-CM | POA: Diagnosis not present

## 2016-07-29 DIAGNOSIS — M545 Low back pain: Secondary | ICD-10-CM | POA: Diagnosis not present

## 2016-07-29 LAB — UA/M W/RFLX CULTURE, ROUTINE
Bilirubin, UA: NEGATIVE
GLUCOSE, UA: NEGATIVE
KETONES UA: NEGATIVE
LEUKOCYTES UA: NEGATIVE
Nitrite, UA: NEGATIVE
PH UA: 5 (ref 5.0–7.5)
Protein, UA: NEGATIVE
RBC UA: NEGATIVE
Specific Gravity, UA: 1.025 (ref 1.005–1.030)
Urobilinogen, Ur: 0.2 mg/dL (ref 0.2–1.0)

## 2016-07-29 MED ORDER — OMEPRAZOLE 20 MG PO CPDR: 20.0000 mg | DELAYED_RELEASE_CAPSULE | Freq: Every day | ORAL | 1 refills | Status: DC

## 2016-07-29 MED ORDER — OMEPRAZOLE 20 MG PO CPDR: 20.0000 mg | DELAYED_RELEASE_CAPSULE | Freq: Every day | ORAL | 3 refills | Status: DC

## 2016-07-29 NOTE — Progress Notes (Signed)
   BP 117/79 (BP Location: Right Arm, Patient Position: Sitting, Cuff Size: Large)   Pulse 73   Temp 98 F (36.7 C)   Wt 227 lb 3.2 oz (103.1 kg)   SpO2 95%   BMI 35.06 kg/m    Subjective:    Patient ID: Scott Roof., male    DOB: July 26, 1940, 76 y.o.   MRN: [place]:[id number]  HPI: Scott Remache. is a 76 y.o. male  Chief Complaint  Patient presents with  . Back Pain    pt states he has been having lower back pain since Saturday   . Abdominal Pain    pt states his stomach has been feeling like he ate something that upset it    Back pain Since Saturday.  Points to right lower back.  Pt doesn't know what makes it worse and what makes better.  States the pain comes and goes.  He does say he is remodeling his house.    GERD He has not had acid reflux medication refilled.    Relevant past medical, surgical, family and social history reviewed and updated as indicated. Interim medical history since our last visit reviewed. Allergies and medications reviewed and updated.  Review of Systems  Per HPI unless specifically indicated above     Objective:    BP 117/79 (BP Location: Right Arm, Patient Position: Sitting, Cuff Size: Large)   Pulse 73   Temp 98 F (36.7 C)   Wt 227 lb 3.2 oz (103.1 kg)   SpO2 95%   BMI 35.06 kg/m   Wt Readings from Last 3 Encounters:  07/29/16 227 lb 3.2 oz (103.1 kg)  06/02/16 232 lb 3.2 oz (105.3 kg)  03/03/16 222 lb 3.2 oz (100.8 kg)    Physical Exam  Constitutional: He is oriented to person, place, and time. He appears well-developed and well-nourished. No distress.  HENT:  Head: Normocephalic and atraumatic.  Eyes: Conjunctivae and lids are normal. Right eye exhibits no discharge. Left eye exhibits no discharge. No scleral icterus.  Neck: Normal range of motion. Neck supple. No JVD present. Carotid bruit is not present.  Cardiovascular: Normal rate, regular rhythm and normal heart sounds.   Pulmonary/Chest: Effort normal and breath  sounds normal. No respiratory distress.  Abdominal: Normal appearance. There is no splenomegaly or hepatomegaly.  Musculoskeletal: Normal range of motion.       Lumbar back: He exhibits tenderness.  Pain right side with left rotation and bending.    Neurological: He is alert and oriented to person, place, and time.  Skin: Skin is warm, dry and intact. No rash noted. No pallor.  Psychiatric: He has a normal mood and affect. His behavior is normal. Judgment and thought content normal.   Urine is normal    Assessment & Plan:   Problem List Items Addressed This Visit      Unprioritized   GERD (gastroesophageal reflux disease)    Refill Omeprazole at the local grocery      Relevant Medications   omeprazole (PRILOSEC) 20 MG capsule   omeprazole (PRILOSEC) 20 MG capsule    Other Visit Diagnoses    Low back pain, unspecified back pain laterality, unspecified chronicity, with sciatica presence unspecified    -  Primary   Extra strength Tylenol up to QID   Relevant Orders   UA/M w/rflx Culture, Routine       Follow up plan: Return if symptoms worsen or fail to improve.

## 2016-07-29 NOTE — Assessment & Plan Note (Signed)
Refill Omeprazole at the local grocery

## 2016-09-07 ENCOUNTER — Ambulatory Visit (INDEPENDENT_AMBULATORY_CARE_PROVIDER_SITE_OTHER): Payer: Medicare HMO | Admitting: Unknown Physician Specialty

## 2016-09-07 ENCOUNTER — Encounter: Payer: Self-pay | Admitting: Unknown Physician Specialty

## 2016-09-07 VITALS — BP 125/76 | HR 71 | Temp 97.6°F | Wt 223.8 lb

## 2016-09-07 DIAGNOSIS — N182 Chronic kidney disease, stage 2 (mild): Secondary | ICD-10-CM | POA: Diagnosis not present

## 2016-09-07 DIAGNOSIS — I10 Essential (primary) hypertension: Secondary | ICD-10-CM

## 2016-09-07 DIAGNOSIS — E1122 Type 2 diabetes mellitus with diabetic chronic kidney disease: Secondary | ICD-10-CM | POA: Diagnosis not present

## 2016-09-07 DIAGNOSIS — E1165 Type 2 diabetes mellitus with hyperglycemia: Secondary | ICD-10-CM | POA: Diagnosis not present

## 2016-09-07 DIAGNOSIS — Z794 Long term (current) use of insulin: Secondary | ICD-10-CM | POA: Diagnosis not present

## 2016-09-07 DIAGNOSIS — IMO0002 Reserved for concepts with insufficient information to code with codable children: Secondary | ICD-10-CM

## 2016-09-07 LAB — BAYER DCA HB A1C WAIVED: HB A1C: 8.3 % — AB (ref <7.0)

## 2016-09-07 NOTE — Assessment & Plan Note (Signed)
Stable, continue present medications.   

## 2016-09-07 NOTE — Progress Notes (Signed)
BP 125/76 (BP Location: Right Arm, Cuff Size: Large)   Pulse 71   Temp 97.6 F (36.4 C)   Wt 223 lb 12.8 oz (101.5 kg)   SpO2 98%   BMI 34.53 kg/m    Subjective:    Patient ID: Scott Roof., male    DOB: [birth year], 76 y.o.   MRN: [id number]  HPI: Scott Mayhall. is a 76 y.o. male  Chief Complaint  Patient presents with  . Diabetes    pt states he is here to have his sugar checked    Diabetes: Using medications without difficulties.  Taking 35 units of Lantus QHS No hypoglycemic episodes No hyperglycemic episodes Feet problems: Blood Sugars averaging: twice a day varies for 90-110-112 in the AM and 130 in the PM Last Hgb A1C: 7.4  Hypertension  Using medications without difficulty Average home BPs Not checking  Using medication without problems or lightheadedness No chest pain with exertion or shortness of breath No Edema  Elevated Cholesterol Using medications without problems No Muscle aches  Diet: Not aware of any eating habits he can change.  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 Interim medical history since our last visit reviewed. Allergies and medications reviewed and updated.  Review of Systems  Per HPI unless specifically indicated above     Objective:    BP 125/76 (BP Location: Right Arm, Cuff Size: Large)   Pulse 71   Temp 97.6 F (36.4 C)   Wt 223 lb 12.8 oz (101.5 kg)   SpO2 98%   BMI 34.53 kg/m   Wt Readings from Last 3 Encounters:  09/07/16 223 lb 12.8 oz (101.5 kg)  07/29/16 227 lb 3.2 oz (103.1 kg)  06/02/16 232 lb 3.2 oz (105.3 kg)    Physical Exam  Constitutional: He is oriented to person, place, and time. He appears well-developed and well-nourished. No distress.  HENT:  Head: Normocephalic and atraumatic.  Eyes: Conjunctivae and lids are normal. Right eye exhibits no  discharge. Left eye exhibits no discharge. No scleral icterus.  Neck: Normal range of motion. Neck supple. No JVD present. Carotid bruit is not present.  Cardiovascular: Normal rate, regular rhythm and normal heart sounds.   Pulmonary/Chest: Effort normal and breath sounds normal. No respiratory distress.  Abdominal: Normal appearance. There is no splenomegaly or hepatomegaly.  Musculoskeletal: Normal range of motion.  Neurological: He is alert and oriented to person, place, and time.  Skin: Skin is warm, dry and intact. No rash noted. No pallor.  Psychiatric: He has a normal mood and affect. His behavior is normal. Judgment and thought content normal.    Results for orders placed or performed in visit on 07/29/16  UA/M w/rflx Culture, Routine  Result Value Ref Range   Specific Gravity, UA 1.025 1.005 - 1.030   pH, UA 5.0 5.0 - 7.5   Color, UA Yellow Yellow   Appearance Ur Clear Clear   Leukocytes, UA Negative Negative   Protein, UA Negative Negative/Trace   Glucose, UA Negative Negative   Ketones, UA Negative Negative   RBC, UA Negative Negative   Bilirubin, UA Negative Negative   Urobilinogen, Ur 0.2 0.2 - 1.0 mg/dL   Nitrite, UA Negative Negative      Assessment & Plan:   Problem List Items Addressed This Visit  Unprioritized   Essential hypertension, benign    Stable, continue present medications.        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  Pt feels he can bring in down in 3 months.  Will refer to the lifestyle center and recheck in 3 months      Relevant Orders   Comprehensive metabolic panel   Bayer DCA Hb A1c Waived   Amb Referral to Nutrition and Diabetic E      Reviewed diet and exercise.  Consider starting on a GLP1  Follow up plan: Return in about 3 months (around 12/08/2016).

## 2016-09-07 NOTE — Assessment & Plan Note (Signed)
Poor control at 8.3%.  Pt feels he can bring in down in 3 months.  Will refer to the lifestyle center and recheck in 3 months

## 2016-09-08 LAB — COMPREHENSIVE METABOLIC PANEL
ALBUMIN: 4.7 g/dL (ref 3.5–4.8)
ALT: 15 IU/L (ref 0–44)
AST: 16 IU/L (ref 0–40)
Albumin/Globulin Ratio: 1.8 (ref 1.2–2.2)
Alkaline Phosphatase: 78 IU/L (ref 39–117)
BUN / CREAT RATIO: 21 (ref 10–24)
BUN: 24 mg/dL (ref 8–27)
Bilirubin Total: 0.8 mg/dL (ref 0.0–1.2)
CO2: 22 mmol/L (ref 18–29)
CREATININE: 1.15 mg/dL (ref 0.76–1.27)
Calcium: 10.1 mg/dL (ref 8.6–10.2)
Chloride: 99 mmol/L (ref 96–106)
GFR calc non Af Amer: 62 mL/min/{1.73_m2} (ref >59)
GFR, EST AFRICAN AMERICAN: 72 mL/min/{1.73_m2} (ref >59)
GLUCOSE: 203 mg/dL — AB (ref 65–99)
Globulin, Total: 2.6 g/dL (ref 1.5–4.5)
Potassium: 4.7 mmol/L (ref 3.5–5.2)
Sodium: 140 mmol/L (ref 134–144)
TOTAL PROTEIN: 7.3 g/dL (ref 6.0–8.5)

## 2016-09-14 ENCOUNTER — Other Ambulatory Visit: Payer: Self-pay

## 2016-09-14 MED ORDER — GLIPIZIDE 5 MG PO TABS: 5.0000 mg | ORAL_TABLET | Freq: Two times a day (BID) | ORAL | 3 refills | Status: DC

## 2016-09-14 MED ORDER — LISINOPRIL 20 MG PO TABS: 20.0000 mg | ORAL_TABLET | Freq: Every day | ORAL | 1 refills | Status: DC

## 2016-09-14 MED ORDER — TAMSULOSIN HCL 0.4 MG PO CAPS: 0.4000 mg | ORAL_CAPSULE | Freq: Every day | ORAL | 3 refills | Status: DC

## 2016-09-14 MED ORDER — FINASTERIDE 5 MG PO TABS: 5.0000 mg | ORAL_TABLET | Freq: Every day | ORAL | 1 refills | Status: DC

## 2016-09-25 ENCOUNTER — Ambulatory Visit: Payer: Self-pay | Admitting: *Deleted

## 2016-11-06 ENCOUNTER — Other Ambulatory Visit: Payer: Self-pay

## 2016-11-06 NOTE — Telephone Encounter (Signed)
Order received from [facility] for rx for glucose meter, test strips, lancets, and swabs. Order faxed.

## 2016-11-18 ENCOUNTER — Ambulatory Visit (INDEPENDENT_AMBULATORY_CARE_PROVIDER_SITE_OTHER): Payer: Medicare HMO

## 2016-11-18 VITALS — BP 140/82 | HR 62 | Temp 98.0°F | Resp 16 | Ht 68.0 in | Wt 232.4 lb

## 2016-11-18 DIAGNOSIS — Z0000 Encounter for general adult medical examination without abnormal findings: Secondary | ICD-10-CM | POA: Diagnosis not present

## 2016-11-18 NOTE — Patient Instructions (Signed)
Scott Lawson , Thank you for taking time to come for your Medicare Wellness Visit. I appreciate your ongoing commitment to your health goals. Please review the following plan we discussed and let me know if I can assist you in the future.   Screening recommendations/referrals: Colonoscopy: Completed 03/13/2011, no longer required Recommended yearly ophthalmology/optometry visit for glaucoma screening and checkup Recommended yearly dental visit for hygiene and checkup  Vaccinations: Influenza vaccine: up to date, due 02/2017 Pneumococcal vaccine: completed series Tdap vaccine: up to date Shingles vaccine: due, check with your insurance company for coverage  Advanced directives: Please bring a copy at your convenience.   Conditions/risks identified: Recommend to continue drinking at least 4-5 glasses of water a day.   Next appointment: Follow up with Kathrine Haddock on 12/01/2016 at 10:30am. Follow up in one year for your annual wellness exam.   Preventive Care 75 Years and Older, Male Preventive care refers to lifestyle choices and visits with your health care provider that can promote health and wellness. What does preventive care include?  A yearly physical exam. This is also called an annual well check.  Dental exams once or twice a year.  Routine eye exams. Ask your health care provider how often you should have your eyes checked.  Personal lifestyle choices, including:  Daily care of your teeth and gums.  Regular physical activity.  Eating a healthy diet.  Avoiding tobacco and drug use.  Limiting alcohol use.  Practicing safe sex.  Taking low doses of aspirin every day.  Taking vitamin and mineral supplements as recommended by your health care provider. What happens during an annual well check? The services and screenings done by your health care provider during your annual well check will depend on your age, overall health, lifestyle risk factors, and family history of  disease. Counseling  Your health care provider may ask you questions about your:  Alcohol use.  Tobacco use.  Drug use.  Emotional well-being.  Home and relationship well-being.  Sexual activity.  Eating habits.  History of falls.  Memory and ability to understand (cognition).  Work and work Statistician. Screening  You may have the following tests or measurements:  Height, weight, and BMI.  Blood pressure.  Lipid and cholesterol levels. These may be checked every 5 years, or more frequently if you are over 75 years old.  Skin check.  Lung cancer screening. You may have this screening every year starting at age 75 if you have a 30-pack-year history of smoking and currently smoke or have quit within the past 15 years.  Fecal occult blood test (FOBT) of the stool. You may have this test every year starting at age 75.  Flexible sigmoidoscopy or colonoscopy. You may have a sigmoidoscopy every 5 years or a colonoscopy every 10 years starting at age 75.  Prostate cancer screening. Recommendations will vary depending on your family history and other risks.  Hepatitis C blood test.  Hepatitis B blood test.  Sexually transmitted disease (STD) testing.  Diabetes screening. This is done by checking your blood sugar (glucose) after you have not eaten for a while (fasting). You may have this done every 1-3 years.  Abdominal aortic aneurysm (AAA) screening. You may need this if you are a current or former smoker.  Osteoporosis. You may be screened starting at age 75. if you are at high risk. Talk with your health care provider about your test results, treatment options, and if necessary, the need for more tests. Vaccines  Your health care provider may recommend certain vaccines, such as:  Influenza vaccine. This is recommended every year.  Tetanus, diphtheria, and acellular pertussis (Tdap, Td) vaccine. You may need a Td booster every 10 years.  Zoster vaccine. You may  need this after age 75.  Pneumococcal 13-valent conjugate (PCV13) vaccine. One dose is recommended after age 75.  Pneumococcal polysaccharide (PPSV23) vaccine. One dose is recommended after age 75. Talk to your health care provider about which screenings and vaccines you need and how often you need them. This information is not intended to replace advice given to you by your health care provider. Make sure you discuss any questions you have with your health care provider. Document Released: 06/28/2015 Document Revised: 02/19/2016 Document Reviewed: 04/02/2015 Elsevier Interactive Patient Education  2017 [place] Prevention in the Home Falls can cause injuries. They can happen to people of all ages. There are many things you can do to make your home safe and to help prevent falls. What can I do on the outside of my home?  Regularly fix the edges of walkways and driveways and fix any cracks.  Remove anything that might make you trip as you walk through a door, such as a raised step or threshold.  Trim any bushes or trees on the path to your home.  Use bright outdoor lighting.  Clear any walking paths of anything that might make someone trip, such as rocks or tools.  Regularly check to see if handrails are loose or broken. Make sure that both sides of any steps have handrails.  Any raised decks and porches should have guardrails on the edges.  Have any leaves, snow, or ice cleared regularly.  Use sand or salt on walking paths during winter.  Clean up any spills in your garage right away. This includes oil or grease spills. What can I do in the bathroom?  Use night lights.  Install grab bars by the toilet and in the tub and shower. Do not use towel bars as grab bars.  Use non-skid mats or decals in the tub or shower.  If you need to sit down in the shower, use a plastic, non-slip stool.  Keep the floor dry. Clean up any water that spills on the floor as soon as it  happens.  Remove soap buildup in the tub or shower regularly.  Attach bath mats securely with double-sided non-slip rug tape.  Do not have throw rugs and other things on the floor that can make you trip. What can I do in the bedroom?  Use night lights.  Make sure that you have a light by your bed that is easy to reach.  Do not use any sheets or blankets that are too big for your bed. They should not hang down onto the floor.  Have a firm chair that has side arms. You can use this for support while you get dressed.  Do not have throw rugs and other things on the floor that can make you trip. What can I do in the kitchen?  Clean up any spills right away.  Avoid walking on wet floors.  Keep items that you use a lot in easy-to-reach places.  If you need to reach something above you, use a strong step stool that has a grab bar.  Keep electrical cords out of the way.  Do not use floor polish or wax that makes floors slippery. If you must use wax, use non-skid floor wax.  Do  not have throw rugs and other things on the floor that can make you trip. What can I do with my stairs?  Do not leave any items on the stairs.  Make sure that there are handrails on both sides of the stairs and use them. Fix handrails that are broken or loose. Make sure that handrails are as long as the stairways.  Check any carpeting to make sure that it is firmly attached to the stairs. Fix any carpet that is loose or worn.  Avoid having throw rugs at the top or bottom of the stairs. If you do have throw rugs, attach them to the floor with carpet tape.  Make sure that you have a light switch at the top of the stairs and the bottom of the stairs. If you do not have them, ask someone to add them for you. What else can I do to help prevent falls?  Wear shoes that:  Do not have high heels.  Have rubber bottoms.  Are comfortable and fit you well.  Are closed at the toe. Do not wear sandals.  If you  use a stepladder:  Make sure that it is fully opened. Do not climb a closed stepladder.  Make sure that both sides of the stepladder are locked into place.  Ask someone to hold it for you, if possible.  Clearly mark and make sure that you can see:  Any grab bars or handrails.  First and last steps.  Where the edge of each step is.  Use tools that help you move around (mobility aids) if they are needed. These include:  Canes.  Walkers.  Scooters.  Crutches.  Turn on the lights when you go into a dark area. Replace any light bulbs as soon as they burn out.  Set up your furniture so you have a clear path. Avoid moving your furniture around.  If any of your floors are uneven, fix them.  If there are any pets around you, be aware of where they are.  Review your medicines with your doctor. Some medicines can make you feel dizzy. This can increase your chance of falling. Ask your doctor what other things that you can do to help prevent falls. This information is not intended to replace advice given to you by your health care provider. Make sure you discuss any questions you have with your health care provider. Document Released: 03/28/2009 Document Revised: 11/07/2015 Document Reviewed: 07/06/2014 Elsevier Interactive Patient Education  2017 [employer].

## 2016-11-18 NOTE — Progress Notes (Signed)
Subjective:   Princess Bruins Grochowski [name] is a 76 y.o. male who presents for Medicare Annual/Subsequent preventive examination.  Review of Systems:   Cardiac Risk Factors include: advanced age (>68men, >36 women);male gender;diabetes mellitus;dyslipidemia;hypertension;obesity (BMI >30kg/m2)     Objective:    Vitals: BP 140/82 (BP Location: Right Arm, Patient Position: Sitting)   Pulse 62   Temp 98 F (36.7 C)   Resp 16   Ht 5\' 8"  (1.727 m)   Wt 232 lb 6.4 oz (105.4 kg)   BMI 35.34 kg/m   Body mass index is 35.34 kg/m.  Tobacco History  Smoking Status  . Never Smoker  Smokeless Tobacco  . Never Used     Counseling given: Not Answered   Past Medical History:  Diagnosis Date  . BPH (benign prostatic hyperplasia)   . Diabetes mellitus without complication ([place])   . Elevated PSA   . GERD (gastroesophageal reflux disease)   . Hypertension   . Hypertriglyceridemia   . Nocturia    Past Surgical History:  Procedure Laterality Date  . APPENDECTOMY    . CARPAL TUNNEL RELEASE Left 12/11/2015   Procedure: CARPAL TUNNEL RELEASE;  Surgeon: Earnestine Leys, MD;  Location: [facility] ORS;  Service: Orthopedics;  Laterality: Left;  . EYE SURGERY     Family History  Problem Relation Age of Onset  . Cancer Mother        lung  . Heart disease Father   . Diabetes Brother    History  Sexual Activity  . Sexual activity: Yes    Outpatient Encounter Prescriptions as of 11/18/2016  Medication Sig  . aspirin 81 MG tablet Take 81 mg by mouth daily.  Marland Kitchen atorvastatin (LIPITOR) 40 MG tablet Take 1 tablet (40 mg total) by mouth daily.  . finasteride (PROSCAR) 5 MG tablet Take 1 tablet (5 mg total) by mouth daily.  . fluticasone (FLONASE) 50 MCG/ACT nasal spray Place 2 sprays into both nostrils daily as needed.   Marland Kitchen glipiZIDE (GLUCOTROL) 5 MG tablet Take 1 tablet (5 mg total) by mouth 2 (two) times daily.  Marland Kitchen glucose blood test strip 1 each by Other route as needed for other. Use as instructed  .  insulin glargine (LANTUS) 100 UNIT/ML injection Inject 35 Units into the skin at bedtime.  Marland Kitchen lisinopril (PRINIVIL,ZESTRIL) 20 MG tablet Take 1 tablet (20 mg total) by mouth daily.  . meloxicam (MOBIC) 15 MG tablet Take 15 mg by mouth daily.  . metFORMIN (GLUCOPHAGE) 500 MG tablet TAKE 1 TABLET (500 MG TOTAL) BY MOUTH 2 (TWO) TIMES DAILY WITH A MEAL.  Marland Kitchen omeprazole (PRILOSEC) 20 MG capsule Take 1 capsule (20 mg total) by mouth daily.  Marland Kitchen omeprazole (PRILOSEC) 20 MG capsule Take 1 capsule (20 mg total) by mouth daily.  . tamsulosin (FLOMAX) 0.4 MG CAPS capsule Take 1 capsule (0.4 mg total) by mouth daily.   No facility-administered encounter medications on file as of 11/18/2016.     Activities of Daily Living In your present state of health, do you have any difficulty performing the following activities: 11/18/2016 12/02/2015  Hearing? N N  Vision? N N  Difficulty concentrating or making decisions? N N  Walking or climbing stairs? N N  Dressing or bathing? N N  Doing errands, shopping? N N  Preparing Food and eating ? N -  Using the Toilet? N -  In the past six months, have you accidently leaked urine? N -  Do you have problems with loss of bowel  control? N -  Managing your Medications? N -  Managing your Finances? N -  Housekeeping or managing your Housekeeping? N -  Some recent data might be hidden    Patient Care Team: Kathrine Haddock, NP as PCP - General (Nurse Practitioner)   Assessment:     Exercise Activities and Dietary recommendations Current Exercise Habits: The patient has a physically strenous job, but has no regular exercise apart from work.  Goals    . Increase water intake          Recommend to continue drinking at least 4-5 glasses of water a day.       Fall Risk Fall Risk  11/18/2016 06/02/2016 06/12/2015 05/29/2015 05/15/2015  Falls in the past year? Yes Yes No No No  Number falls in past yr: 1 1 - - -  Injury with Fall? Yes Yes - - -  Follow up Falls prevention  discussed - - - -   Depression Screen PHQ 2/9 Scores 11/18/2016 11/18/2016 06/02/2016 05/29/2015  PHQ - 2 Score 0 0 0 0  PHQ- 9 Score 0 - - -    Cognitive Function     6CIT Screen 11/18/2016  What Year? 0 points  What month? 0 points  What time? 0 points  Count back from 20 0 points  Months in reverse 0 points  Repeat phrase 4 points  Total Score 4    Immunization History  Administered Date(s) Administered  . Influenza, High Dose Seasonal PF 03/03/2016  . Influenza,inj,Quad PF,36+ Mos 05/15/2015  . Pneumococcal Conjugate-13 10/16/2013  . Pneumococcal Polysaccharide-23 12/06/2012  . Tdap 11/23/2013   Screening Tests Health Maintenance  Topic Date Due  . FOOT EXAM  11/17/2016  . INFLUENZA VACCINE  01/13/2017  . OPHTHALMOLOGY EXAM  02/26/2017  . HEMOGLOBIN A1C  03/10/2017  . COLONOSCOPY  03/09/2021  . TETANUS/TDAP  11/24/2023  . PNA vac Low Risk Adult  Completed      Plan:  I have personally reviewed and addressed the Medicare Annual Wellness questionnaire and have noted the following in the patient's chart:  A. Medical and social history B. Use of alcohol, tobacco or illicit drugs  C. Current medications and supplements D. Functional ability and status E.  Nutritional status F.  Physical activity G. Advance directives H. List of other physicians I.  Hospitalizations, surgeries, and ER visits in previous 12 months J.  [place] such as hearing and vision if needed, cognitive and depression L. Referrals and appointments   In addition, I have reviewed and discussed with patient certain preventive protocols, quality metrics, and best practice recommendations. A written personalized care plan for preventive services as well as general preventive health recommendations were provided to patient.   Signed,  Tyler Aas, LPN Nurse Health Advisor   MD Recommendations:

## 2016-12-01 ENCOUNTER — Encounter: Payer: Self-pay | Admitting: Unknown Physician Specialty

## 2016-12-01 ENCOUNTER — Ambulatory Visit (INDEPENDENT_AMBULATORY_CARE_PROVIDER_SITE_OTHER): Payer: Medicare HMO | Admitting: Unknown Physician Specialty

## 2016-12-01 VITALS — BP 141/76 | HR 68 | Temp 97.9°F | Wt 227.2 lb

## 2016-12-01 DIAGNOSIS — E1122 Type 2 diabetes mellitus with diabetic chronic kidney disease: Secondary | ICD-10-CM

## 2016-12-01 DIAGNOSIS — N182 Chronic kidney disease, stage 2 (mild): Secondary | ICD-10-CM

## 2016-12-01 DIAGNOSIS — Z7189 Other specified counseling: Secondary | ICD-10-CM | POA: Insufficient documentation

## 2016-12-01 DIAGNOSIS — E782 Mixed hyperlipidemia: Secondary | ICD-10-CM | POA: Diagnosis not present

## 2016-12-01 DIAGNOSIS — E1165 Type 2 diabetes mellitus with hyperglycemia: Secondary | ICD-10-CM | POA: Diagnosis not present

## 2016-12-01 DIAGNOSIS — I10 Essential (primary) hypertension: Secondary | ICD-10-CM | POA: Diagnosis not present

## 2016-12-01 DIAGNOSIS — Z794 Long term (current) use of insulin: Secondary | ICD-10-CM

## 2016-12-01 DIAGNOSIS — K219 Gastro-esophageal reflux disease without esophagitis: Secondary | ICD-10-CM | POA: Diagnosis not present

## 2016-12-01 DIAGNOSIS — Z0000 Encounter for general adult medical examination without abnormal findings: Secondary | ICD-10-CM

## 2016-12-01 DIAGNOSIS — IMO0002 Reserved for concepts with insufficient information to code with codable children: Secondary | ICD-10-CM

## 2016-12-01 LAB — LIPID PANEL PICCOLO, WAIVED
CHOL/HDL RATIO PICCOLO,WAIVE: 3.8 mg/dL
Cholesterol Piccolo, Waived: 165 mg/dL (ref <200)
HDL CHOL PICCOLO, WAIVED: 43 mg/dL — AB (ref >59)
LDL CHOL CALC PICCOLO WAIVED: 91 mg/dL (ref <100)
TRIGLYCERIDES PICCOLO,WAIVED: 157 mg/dL — AB (ref <150)
VLDL CHOL CALC PICCOLO,WAIVE: 31 mg/dL — AB (ref <30)

## 2016-12-01 LAB — BAYER DCA HB A1C WAIVED: HB A1C (BAYER DCA - WAIVED): 7.6 % — ABNORMAL HIGH (ref <7.0)

## 2016-12-01 MED ORDER — ATORVASTATIN CALCIUM 40 MG PO TABS: 40.0000 mg | ORAL_TABLET | Freq: Every day | ORAL | 3 refills | Status: DC

## 2016-12-01 MED ORDER — METFORMIN HCL 500 MG PO TABS: 500.0000 mg | ORAL_TABLET | Freq: Two times a day (BID) | ORAL | 3 refills | Status: DC

## 2016-12-01 NOTE — Assessment & Plan Note (Signed)
A voluntary discussion about advance care planning including the explanation and discussion of advance directives was extensively discussed  with the patient.  Explanation about the health care proxy and Living will was reviewed and packet with forms with explanation of how to fill them out was given.  During this discussion, the patient was able to identify a health care proxy as his youngest daughter and has filled out the paperwork required.  Patient was offered a separate [place] visit for further assistance with forms.

## 2016-12-01 NOTE — Progress Notes (Signed)
BP (!) 141/76 (BP Location: Left Arm, Cuff Size: Large)   Pulse 68   Temp 97.9 F (36.6 C)   Wt 227 lb 3.2 oz (103.1 kg)   SpO2 97%   BMI 34.55 kg/m    Subjective:    Patient ID: Scott Roof., male    DOB: 1941-05-20, 76 y.o.   MRN: [id number]  HPI: Scott Venditto. is a 76 y.o. male  Chief Complaint  Patient presents with  . Annual Exam   Family History  Problem Relation Age of Onset  . Cancer Mother        lung  . Heart disease Father   . Diabetes Brother    Social History   Social History  . Marital status: Married    Spouse name: N/A  . Number of children: N/A  . Years of education: N/A   Occupational History  . Not on file.   Social History Main Topics  . Smoking status: Never Smoker  . Smokeless tobacco: Never Used  . Alcohol use No  . Drug use: No  . Sexual activity: Yes   Other Topics Concern  . Not on file   Social History Narrative  . No narrative on file   Past Medical History:  Diagnosis Date  . BPH (benign prostatic hyperplasia)   . Diabetes mellitus without complication ([place])   . Elevated PSA   . GERD (gastroesophageal reflux disease)   . Hypertension   . Hypertriglyceridemia   . Nocturia    Past Surgical History:  Procedure Laterality Date  . APPENDECTOMY    . CARPAL TUNNEL RELEASE Left 12/11/2015   Procedure: CARPAL TUNNEL RELEASE;  Surgeon: Earnestine Leys, MD;  Location: [facility] ORS;  Service: Orthopedics;  Laterality: Left;  . EYE SURGERY     Diabetes: Using medications without difficulties No hypoglycemic episodes No hyperglycemic episodes Feet problems: none Blood Sugars averaging: Not checking eye exam within last year Last Hgb A1C:8.3  Hypertension  Using medications without difficulty Average home BPs   Using medication without problems or lightheadedness No chest pain with exertion or shortness of breath No Edema  Elevated Cholesterol Using medications without problems No Muscle aches  Diet:  Exercise: Farms.    GERD No stomach problems since taking the antacid pill  Relevant past medical, surgical, family and social history reviewed and updated as indicated. Interim medical history since our last visit reviewed. Allergies and medications reviewed and updated.  Review of Systems  Per HPI unless specifically indicated above     Objective:    BP (!) 141/76 (BP Location: Left Arm, Cuff Size: Large)   Pulse 68   Temp 97.9 F (36.6 C)   Wt 227 lb 3.2 oz (103.1 kg)   SpO2 97%   BMI 34.55 kg/m   Wt Readings from Last 3 Encounters:  12/01/16 227 lb 3.2 oz (103.1 kg)  11/18/16 232 lb 6.4 oz (105.4 kg)  09/07/16 223 lb 12.8 oz (101.5 kg)    Physical Exam  Constitutional: He is oriented to person, place, and time. He appears well-developed and well-nourished.  HENT:  Head: Normocephalic.  Right Ear: Tympanic membrane, external ear and ear canal normal.  Left Ear: Tympanic membrane, external ear and ear canal normal.  Mouth/Throat: Uvula is midline, oropharynx is clear and moist and mucous membranes are normal.  Eyes: Pupils are equal, round, and reactive to light.  Cardiovascular: Normal rate, regular rhythm and normal heart sounds.  Exam reveals no gallop and  no friction rub.   No murmur heard. Pulmonary/Chest: Effort normal and breath sounds normal. No respiratory distress.  Abdominal: Soft. Bowel sounds are normal. He exhibits no distension. There is no tenderness.  Musculoskeletal: Normal range of motion.  Neurological: He is alert and oriented to person, place, and time. He has normal reflexes.  Skin: Skin is warm and dry.  Psychiatric: He has a normal mood and affect. His behavior is normal. Judgment and thought content normal.    Results for orders placed or performed in visit on 09/07/16  Comprehensive metabolic panel  Result Value Ref Range   Glucose 203 (H) 65 - 99 mg/dL   BUN 24 8 - 27 mg/dL   Creatinine, Ser 1.15 0.76 - 1.27 mg/dL   GFR calc non Af  Amer 62 >59 mL/min/1.73   GFR calc Af Amer 72 >59 mL/min/1.73   BUN/Creatinine Ratio 21 10 - 24   Sodium 140 134 - 144 mmol/L   Potassium 4.7 3.5 - 5.2 mmol/L   Chloride 99 96 - 106 mmol/L   CO2 22 18 - 29 mmol/L   Calcium 10.1 8.6 - 10.2 mg/dL   Total Protein 7.3 6.0 - 8.5 g/dL   Albumin 4.7 3.5 - 4.8 g/dL   Globulin, Total 2.6 1.5 - 4.5 g/dL   Albumin/Globulin Ratio 1.8 1.2 - 2.2   Bilirubin Total 0.8 0.0 - 1.2 mg/dL   Alkaline Phosphatase 78 39 - 117 IU/L   AST 16 0 - 40 IU/L   ALT 15 0 - 44 IU/L  Bayer DCA Hb A1c Waived  Result Value Ref Range   Bayer DCA Hb A1c Waived 8.3 (H) <7.0 %      Assessment & Plan:   Problem List Items Addressed This Visit      Unprioritized   Advanced care planning/counseling discussion    A voluntary discussion about advance care planning including the explanation and discussion of advance directives was extensively discussed  with the patient.  Explanation about the health care proxy and Living will was reviewed and packet with forms with explanation of how to fill them out was given.  During this discussion, the patient was able to identify a health care proxy as his youngest daughter and has filled out the paperwork required.  Patient was offered a separate [place] visit for further assistance with forms.         Essential hypertension, benign    Stable, continue present medications.        Relevant Medications   atorvastatin (LIPITOR) 40 MG tablet   Other Relevant Orders   Comprehensive metabolic panel   GERD (gastroesophageal reflux disease)    Stable, continue present medications.        Hyperlipidemia    LDL is 91.  Continue current medications.        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  Working hard with diet and exercise.        Relevant Medications   atorvastatin (LIPITOR) 40 MG tablet   metFORMIN (GLUCOPHAGE) 500 MG tablet   Other Relevant Orders  Bayer DCA Hb A1c Waived   Comprehensive metabolic panel   CBC with Differential/Platelet   VITAMIN D 25 Hydroxy (Vit-D Deficiency, Fractures)    Other Visit Diagnoses    Annual physical exam           Follow up plan: Return in about 3 months (around 03/03/2017).

## 2016-12-01 NOTE — Assessment & Plan Note (Signed)
Stable, continue present medications.   

## 2016-12-01 NOTE — Assessment & Plan Note (Signed)
Hgb A1C is improved from 8.2% to 7.6%.  Working hard with diet and exercise.

## 2016-12-01 NOTE — Assessment & Plan Note (Deleted)
Hgb A1C is improved from 8.2% to 7.6%.  Working hard with diet and exercise.

## 2016-12-01 NOTE — Assessment & Plan Note (Signed)
LDL is 91.  Continue current medications.

## 2016-12-02 LAB — COMPREHENSIVE METABOLIC PANEL
A/G RATIO: 1.7 (ref 1.2–2.2)
ALBUMIN: 4.3 g/dL (ref 3.5–4.8)
ALK PHOS: 69 IU/L (ref 39–117)
ALT: 9 IU/L (ref 0–44)
AST: 16 IU/L (ref 0–40)
BUN / CREAT RATIO: 18 (ref 10–24)
BUN: 20 mg/dL (ref 8–27)
Bilirubin Total: 0.6 mg/dL (ref 0.0–1.2)
CO2: 20 mmol/L (ref 20–29)
CREATININE: 1.12 mg/dL (ref 0.76–1.27)
Calcium: 9.5 mg/dL (ref 8.6–10.2)
Chloride: 104 mmol/L (ref 96–106)
GFR calc Af Amer: 74 mL/min/{1.73_m2} (ref >59)
GFR, EST NON AFRICAN AMERICAN: 64 mL/min/{1.73_m2} (ref >59)
GLOBULIN, TOTAL: 2.5 g/dL (ref 1.5–4.5)
Glucose: 96 mg/dL (ref 65–99)
POTASSIUM: 4.5 mmol/L (ref 3.5–5.2)
SODIUM: 140 mmol/L (ref 134–144)
Total Protein: 6.8 g/dL (ref 6.0–8.5)

## 2016-12-02 LAB — CBC WITH DIFFERENTIAL/PLATELET
BASOS: 1 %
Basophils Absolute: 0.1 10*3/uL (ref 0.0–0.2)
EOS (ABSOLUTE): 0.2 10*3/uL (ref 0.0–0.4)
EOS: 2 %
HEMATOCRIT: 40.4 % (ref 37.5–51.0)
Hemoglobin: 13.5 g/dL (ref 13.0–17.7)
Immature Grans (Abs): 0 10*3/uL (ref 0.0–0.1)
Immature Granulocytes: 0 %
LYMPHS ABS: 1.8 10*3/uL (ref 0.7–3.1)
Lymphs: 27 %
MCH: 29.5 pg (ref 26.6–33.0)
MCHC: 33.4 g/dL (ref 31.5–35.7)
MCV: 88 fL (ref 79–97)
MONOS ABS: 0.6 10*3/uL (ref 0.1–0.9)
Monocytes: 10 %
NEUTROS PCT: 60 %
Neutrophils Absolute: 4.1 10*3/uL (ref 1.4–7.0)
PLATELETS: 253 10*3/uL (ref 150–379)
RBC: 4.57 x10E6/uL (ref 4.14–5.80)
RDW: 13.3 % (ref 12.3–15.4)
WBC: 6.7 10*3/uL (ref 3.4–10.8)

## 2016-12-02 LAB — VITAMIN D 25 HYDROXY (VIT D DEFICIENCY, FRACTURES): Vit D, 25-Hydroxy: 27.9 ng/mL — ABNORMAL LOW (ref 30.0–100.0)

## 2016-12-09 ENCOUNTER — Ambulatory Visit: Payer: Self-pay | Admitting: Unknown Physician Specialty

## 2016-12-09 ENCOUNTER — Ambulatory Visit: Payer: Medicare HMO | Admitting: Unknown Physician Specialty

## 2017-01-04 ENCOUNTER — Other Ambulatory Visit: Payer: Self-pay | Admitting: Unknown Physician Specialty

## 2017-01-08 ENCOUNTER — Telehealth: Payer: Self-pay | Admitting: Unknown Physician Specialty

## 2017-01-08 NOTE — Telephone Encounter (Addendum)
Patient stoppd by due to medical facility needing patient's recent blood test results and medication list. Patient needing these to be faxed to: [facility] to the [place]  Phone: [phone] Fax: [phone]  Paperwork placed in [place].  Please Advise.  Thank you

## 2017-01-11 NOTE — Telephone Encounter (Signed)
Information faxed per patient's verbal request.

## 2017-03-08 ENCOUNTER — Encounter: Payer: Self-pay | Admitting: Unknown Physician Specialty

## 2017-03-08 ENCOUNTER — Ambulatory Visit (INDEPENDENT_AMBULATORY_CARE_PROVIDER_SITE_OTHER): Payer: Medicare HMO | Admitting: Unknown Physician Specialty

## 2017-03-08 VITALS — BP 149/82 | HR 61 | Temp 98.2°F | Ht 68.0 in | Wt 235.8 lb

## 2017-03-08 DIAGNOSIS — Z794 Long term (current) use of insulin: Secondary | ICD-10-CM

## 2017-03-08 DIAGNOSIS — E1165 Type 2 diabetes mellitus with hyperglycemia: Secondary | ICD-10-CM | POA: Diagnosis not present

## 2017-03-08 DIAGNOSIS — N182 Chronic kidney disease, stage 2 (mild): Secondary | ICD-10-CM

## 2017-03-08 DIAGNOSIS — I129 Hypertensive chronic kidney disease with stage 1 through stage 4 chronic kidney disease, or unspecified chronic kidney disease: Secondary | ICD-10-CM | POA: Diagnosis not present

## 2017-03-08 DIAGNOSIS — E1122 Type 2 diabetes mellitus with diabetic chronic kidney disease: Secondary | ICD-10-CM

## 2017-03-08 DIAGNOSIS — IMO0002 Reserved for concepts with insufficient information to code with codable children: Secondary | ICD-10-CM

## 2017-03-08 DIAGNOSIS — Z23 Encounter for immunization: Secondary | ICD-10-CM

## 2017-03-08 LAB — BAYER DCA HB A1C WAIVED: HB A1C: 7.3 % — AB (ref <7.0)

## 2017-03-08 MED ORDER — LISINOPRIL-HYDROCHLOROTHIAZIDE 20-12.5 MG PO TABS: 1.0000 | ORAL_TABLET | Freq: Every day | ORAL | 1 refills | Status: DC

## 2017-03-08 NOTE — Patient Instructions (Signed)

## 2017-03-08 NOTE — Assessment & Plan Note (Addendum)
Not to goal.  Add 12.5 mg HCTZ.

## 2017-03-08 NOTE — Progress Notes (Signed)
BP (!) 149/82 (BP Location: Left Arm, Cuff Size: Large)   Pulse 61   Temp 98.2 F (36.8 C)   Ht 5\' 8"  (1.727 m)   Wt 235 lb 12.8 oz (107 kg)   SpO2 96%   BMI 35.85 kg/m    Subjective:    Patient ID: Scott Lawson., male    DOB: May 06, 1941, 76 y.o.   MRN: [id number]  HPI: Scott Lawson. is a 76 y.o. male  Chief Complaint  Patient presents with  . Diabetes  . Hyperlipidemia  . Hypertension   Diabetes: Using medications without difficulties.  35 u of insulin at night No hypoglycemic episodes No hyperglycemic episodes Feet problems: none Blood Sugars averaging: 90 this AM and average is 90-110.  300 following meals eye exam within last year Last Hgb A1C: 7.6  Hypertension  Using medications without difficulty Average home BPs  Not checking  Using medication without problems or lightheadedness No chest pain with exertion or shortness of breath No Edema  Elevated Cholesterol Using medications without problems No Muscle aches  Diet: Doesn't eat junk food Exercise: working on the farm   Relevant past medical, surgical, family and social history reviewed and updated as indicated. Interim medical history since our last visit reviewed. Allergies and medications reviewed and updated.  Review of Systems  Per HPI unless specifically indicated above     Objective:    BP (!) 149/82 (BP Location: Left Arm, Cuff Size: Large)   Pulse 61   Temp 98.2 F (36.8 C)   Ht 5\' 8"  (1.727 m)   Wt 235 lb 12.8 oz (107 kg)   SpO2 96%   BMI 35.85 kg/m   Wt Readings from Last 3 Encounters:  03/08/17 235 lb 12.8 oz (107 kg)  12/01/16 227 lb 3.2 oz (103.1 kg)  11/18/16 232 lb 6.4 oz (105.4 kg)    Physical Exam  Constitutional: He is oriented to person, place, and time. He appears well-developed and well-nourished. No distress.  HENT:  Head: Normocephalic and atraumatic.  Eyes: Conjunctivae and lids are normal. Right eye exhibits no discharge. Left eye exhibits no  discharge. No scleral icterus.  Neck: Normal range of motion. Neck supple. No JVD present. Carotid bruit is not present.  Cardiovascular: Normal rate, regular rhythm and normal heart sounds.   Pulmonary/Chest: Effort normal and breath sounds normal. No respiratory distress.  Abdominal: Normal appearance. There is no splenomegaly or hepatomegaly.  Musculoskeletal: Normal range of motion.  Neurological: He is alert and oriented to person, place, and time.  Skin: Skin is warm, dry and intact. No rash noted. No pallor.  Psychiatric: He has a normal mood and affect. His behavior is normal. Judgment and thought content normal.    Results for orders placed or performed in visit on 12/01/16  Bayer DCA Hb A1c Waived  Result Value Ref Range   Bayer DCA Hb A1c Waived 7.6 (H) <7.0 %  Lipid Panel Piccolo, Waived  Result Value Ref Range   Cholesterol Piccolo, Waived 165 <200 mg/dL   HDL Chol Piccolo, Waived 43 (L) >59 mg/dL   Triglycerides Piccolo,Waived 157 (H) <150 mg/dL   Chol/HDL Ratio Piccolo,Waive 3.8 mg/dL   LDL Chol Calc Piccolo Waived 91 <100 mg/dL   VLDL Chol Calc Piccolo,Waive 31 (H) <30 mg/dL  Comprehensive metabolic panel  Result Value Ref Range   Glucose 96 65 - 99 mg/dL   BUN 20 8 - 27 mg/dL   Creatinine, Ser 1.12 0.76 -  1.27 mg/dL   GFR calc non Af Amer 64 >59 mL/min/1.73   GFR calc Af Amer 74 >59 mL/min/1.73   BUN/Creatinine Ratio 18 10 - 24   Sodium 140 134 - 144 mmol/L   Potassium 4.5 3.5 - 5.2 mmol/L   Chloride 104 96 - 106 mmol/L   CO2 20 20 - 29 mmol/L   Calcium 9.5 8.6 - 10.2 mg/dL   Total Protein 6.8 6.0 - 8.5 g/dL   Albumin 4.3 3.5 - 4.8 g/dL   Globulin, Total 2.5 1.5 - 4.5 g/dL   Albumin/Globulin Ratio 1.7 1.2 - 2.2   Bilirubin Total 0.6 0.0 - 1.2 mg/dL   Alkaline Phosphatase 69 39 - 117 IU/L   AST 16 0 - 40 IU/L   ALT 9 0 - 44 IU/L  CBC with Differential/Platelet  Result Value Ref Range   WBC 6.7 3.4 - 10.8 x10E3/uL   RBC 4.57 4.14 - 5.80 x10E6/uL    Hemoglobin 13.5 13.0 - 17.7 g/dL   Hematocrit 40.4 37.5 - 51.0 %   MCV 88 79 - 97 fL   MCH 29.5 26.6 - 33.0 pg   MCHC 33.4 31.5 - 35.7 g/dL   RDW 13.3 12.3 - 15.4 %   Platelets 253 150 - 379 x10E3/uL   Neutrophils 60 Not Estab. %   Lymphs 27 Not Estab. %   Monocytes 10 Not Estab. %   Eos 2 Not Estab. %   Basos 1 Not Estab. %   Neutrophils Absolute 4.1 1.4 - 7.0 x10E3/uL   Lymphocytes Absolute 1.8 0.7 - 3.1 x10E3/uL   Monocytes Absolute 0.6 0.1 - 0.9 x10E3/uL   EOS (ABSOLUTE) 0.2 0.0 - 0.4 x10E3/uL   Basophils Absolute 0.1 0.0 - 0.2 x10E3/uL   Immature Granulocytes 0 Not Estab. %   Immature Grans (Abs) 0.0 0.0 - 0.1 x10E3/uL  VITAMIN D 25 Hydroxy (Vit-D Deficiency, Fractures)  Result Value Ref Range   Vit D, 25-Hydroxy 27.9 (L) 30.0 - 100.0 ng/mL      Assessment & Plan:   Problem List Items Addressed This Visit      Unprioritized   Hypertensive CKD (chronic kidney disease)    Not to goal.  Add 12.5 mg HCTZ.        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  Recheck in 3 months      Relevant Medications   lisinopril-hydrochlorothiazide (PRINZIDE,ZESTORETIC) 20-12.5 MG tablet   Other Relevant Orders   Comprehensive metabolic panel   Bayer DCA Hb A1c Waived    Other Visit Diagnoses    Need for influenza vaccination    -  Primary   Relevant Orders   Flu vaccine HIGH DOSE PF (Completed)       Follow up plan: Return in about 3 months (around 06/07/2017).

## 2017-03-08 NOTE — Assessment & Plan Note (Signed)
Improved today with Hgb A1C 7.3.  Recheck in 3 months

## 2017-03-08 NOTE — Assessment & Plan Note (Signed)
Not to goal.  Add HCTZ

## 2017-03-09 LAB — COMPREHENSIVE METABOLIC PANEL
ALT: 14 IU/L (ref 0–44)
AST: 18 IU/L (ref 0–40)
Albumin/Globulin Ratio: 1.7 (ref 1.2–2.2)
Albumin: 4.3 g/dL (ref 3.5–4.8)
Alkaline Phosphatase: 68 IU/L (ref 39–117)
BUN/Creatinine Ratio: 16 (ref 10–24)
BUN: 19 mg/dL (ref 8–27)
Bilirubin Total: 0.4 mg/dL (ref 0.0–1.2)
CALCIUM: 9.3 mg/dL (ref 8.6–10.2)
CO2: 21 mmol/L (ref 20–29)
CREATININE: 1.17 mg/dL (ref 0.76–1.27)
Chloride: 104 mmol/L (ref 96–106)
GFR calc Af Amer: 70 mL/min/{1.73_m2} (ref >59)
GFR, EST NON AFRICAN AMERICAN: 61 mL/min/{1.73_m2} (ref >59)
GLUCOSE: 81 mg/dL (ref 65–99)
Globulin, Total: 2.6 g/dL (ref 1.5–4.5)
POTASSIUM: 4.4 mmol/L (ref 3.5–5.2)
Sodium: 141 mmol/L (ref 134–144)
Total Protein: 6.9 g/dL (ref 6.0–8.5)

## 2017-03-16 ENCOUNTER — Other Ambulatory Visit: Payer: Self-pay | Admitting: Unknown Physician Specialty

## 2017-04-23 ENCOUNTER — Telehealth: Payer: Self-pay

## 2017-04-23 NOTE — Telephone Encounter (Signed)
Please let him know it is OK to continue taking the plain Lisinopril

## 2017-04-23 NOTE — Telephone Encounter (Signed)
Copied from [place] #[id number]. Topic: Inquiry >> Apr 23, 2017 10:22 AM Conception Chancy, NT wrote:  Reason for CRM: pt is stating he would like dr. Julian Hy nurse to give him a call in regards to his BP medication. States she changed his medication last month and it started messing his BP up. He stopped taking the new one and starting taking the one he used to be on. States it has evened back out and would like to speak with doc nurse about it. [phone]   Called and spoke to patient. He states that since starting the lisinopril/hctz he has felt strange. He states that he checked his BP and it was around 150's. So he states that he has started back taking the lisinopril and has felt better with BP readings around 123/66. Patient wanted to make provider aware and states that he has enough of the lisinopril to last until his f/up with the provider. Just wanted to make [place] aware. Will route to provider as a FYI.

## 2017-04-23 NOTE — Telephone Encounter (Signed)
Patient notified

## 2017-05-27 ENCOUNTER — Telehealth: Payer: Self-pay | Admitting: Unknown Physician Specialty

## 2017-05-27 NOTE — Telephone Encounter (Signed)
Copied from [place]. Topic: Quick Communication - Rx Refill/Question >> May 27, 2017 10:08 AM Neva Seat wrote: Has the patient contacted their pharmacy? no  Preferred Pharmacy : [facility] in [place] prescribed -  Lisinopril  HCTZ 20 mg 12.5 tablet - is making pt feel dizzy.  He took this for a week and stopped.  He is now taking the original Lisinopril 20 mg. 12.5 tablet.  He is wanting the Lisinopril 20mg  12.5 tablet filled at [facility] to last him until he sees Dr. Julian Hy on Dec. 29th

## 2017-05-28 MED ORDER — LISINOPRIL 20 MG PO TABS: 20.0000 mg | ORAL_TABLET | Freq: Every day | ORAL | 1 refills | Status: DC

## 2017-05-28 NOTE — Telephone Encounter (Signed)
Patient experienced dizziness with lisinopril/hctz and would like to be put back on just the lisinopril until his appointment later this month. He would like it sent to [facility].

## 2017-05-28 NOTE — Telephone Encounter (Signed)
Patient notified about medication being sent in.  

## 2017-05-28 NOTE — Telephone Encounter (Signed)
Please advise on refill.

## 2017-06-14 ENCOUNTER — Ambulatory Visit: Payer: Medicare HMO | Admitting: Unknown Physician Specialty

## 2017-06-14 ENCOUNTER — Encounter: Payer: Self-pay | Admitting: Unknown Physician Specialty

## 2017-06-14 VITALS — BP 130/78 | HR 88 | Temp 97.7°F | Wt 242.6 lb

## 2017-06-14 DIAGNOSIS — IMO0002 Reserved for concepts with insufficient information to code with codable children: Secondary | ICD-10-CM

## 2017-06-14 DIAGNOSIS — R319 Hematuria, unspecified: Secondary | ICD-10-CM

## 2017-06-14 DIAGNOSIS — E1122 Type 2 diabetes mellitus with diabetic chronic kidney disease: Secondary | ICD-10-CM | POA: Diagnosis not present

## 2017-06-14 DIAGNOSIS — I10 Essential (primary) hypertension: Secondary | ICD-10-CM

## 2017-06-14 DIAGNOSIS — E1165 Type 2 diabetes mellitus with hyperglycemia: Secondary | ICD-10-CM

## 2017-06-14 DIAGNOSIS — N390 Urinary tract infection, site not specified: Secondary | ICD-10-CM

## 2017-06-14 DIAGNOSIS — E782 Mixed hyperlipidemia: Secondary | ICD-10-CM

## 2017-06-14 LAB — BAYER DCA HB A1C WAIVED: HB A1C (BAYER DCA - WAIVED): 8.8 % — ABNORMAL HIGH (ref <7.0)

## 2017-06-14 LAB — LIPID PANEL PICCOLO, WAIVED
Chol/HDL Ratio Piccolo,Waive: 3.4 mg/dL
Cholesterol Piccolo, Waived: 173 mg/dL (ref <200)
HDL Chol Piccolo, Waived: 50 mg/dL — ABNORMAL LOW (ref >59)
LDL CHOL CALC PICCOLO WAIVED: 65 mg/dL (ref <100)
Triglycerides Piccolo,Waived: 288 mg/dL — ABNORMAL HIGH (ref <150)
VLDL CHOL CALC PICCOLO,WAIVE: 58 mg/dL — AB (ref <30)

## 2017-06-14 MED ORDER — DOXYCYCLINE HYCLATE 100 MG PO TABS: 100.0000 mg | ORAL_TABLET | Freq: Two times a day (BID) | ORAL | 0 refills | Status: DC

## 2017-06-14 MED ORDER — GLIPIZIDE 10 MG PO TABS: 10.0000 mg | ORAL_TABLET | Freq: Two times a day (BID) | ORAL | 3 refills | Status: DC

## 2017-06-14 NOTE — Progress Notes (Signed)
BP 130/78   Pulse 88   Temp 97.7 F (36.5 C) (Oral)   Wt 242 lb 9.6 oz (110 kg)   SpO2 98%   BMI 36.89 kg/m    Subjective:    Patient ID: Scott Apley., male    DOB: Sep 28, 1940, 76 y.o.   MRN: [id number]  HPI: Scott Suski. is a 76 y.o. male  Chief Complaint  Patient presents with  . Diabetes  . Hyperlipidemia  . Hypertension  . Urinary Tract Infection    pt states he has noticed some blood in his urine since last Wednesday   Diabetes: Using medications without difficulties.  Taking 35 u of long-acting insulin at [facility] No hypoglycemic episodes No hyperglycemic episodes Feet problems:none Blood Sugars averaging:"pretty good" till the holidats eye exam within last year Last Hgb A1C: 7.3  Hypertension  Using medications without difficulty Average home BPs: Not checking   Using medication without problems or lightheadedness No chest pain with exertion or shortness of breath No Edema  Elevated Cholesterol Using medications without problems No Muscle aches  Diet: Exercise:Not much with rain  UTI Dark with blood at times.  No discomfort.  Gets up 3 times to use the bathroom;.  No hesitancy.  No history of prostatitis  Relevant past medical, surgical, family and social history reviewed and updated as indicated. Interim medical history since our last visit reviewed. Allergies and medications reviewed and updated.  Review of Systems  Per HPI unless specifically indicated above     Objective:    BP 130/78   Pulse 88   Temp 97.7 F (36.5 C) (Oral)   Wt 242 lb 9.6 oz (110 kg)   SpO2 98%   BMI 36.89 kg/m   Wt Readings from Last 3 Encounters:  06/14/17 242 lb 9.6 oz (110 kg)  03/08/17 235 lb 12.8 oz (107 kg)  12/01/16 227 lb 3.2 oz (103.1 kg)    Physical Exam  Constitutional: He is oriented to person, place, and time. He appears well-developed and well-nourished. No distress.  HENT:  Head: Normocephalic and atraumatic.  Eyes:  Conjunctivae and lids are normal. Right eye exhibits no discharge. Left eye exhibits no discharge. No scleral icterus.  Neck: Normal range of motion. Neck supple. No JVD present. Carotid bruit is not present.  Cardiovascular: Normal rate, regular rhythm and normal heart sounds.  Pulmonary/Chest: Effort normal and breath sounds normal. No respiratory distress.  Abdominal: Normal appearance. There is no splenomegaly or hepatomegaly.  Musculoskeletal: Normal range of motion.  Neurological: He is alert and oriented to person, place, and time.  Skin: Skin is warm, dry and intact. No rash noted. No pallor.  Psychiatric: He has a normal mood and affect. His behavior is normal. Judgment and thought content normal.    Results for orders placed or performed in visit on 03/08/17  Comprehensive metabolic panel  Result Value Ref Range   Glucose 81 65 - 99 mg/dL   BUN 19 8 - 27 mg/dL   Creatinine, Ser 1.17 0.76 - 1.27 mg/dL   GFR calc non Af Amer 61 >59 mL/min/1.73   GFR calc Af Amer 70 >59 mL/min/1.73   BUN/Creatinine Ratio 16 10 - 24   Sodium 141 134 - 144 mmol/L   Potassium 4.4 3.5 - 5.2 mmol/L   Chloride 104 96 - 106 mmol/L   CO2 21 20 - 29 mmol/L   Calcium 9.3 8.6 - 10.2 mg/dL   Total Protein 6.9 6.0 - 8.5 g/dL  Albumin 4.3 3.5 - 4.8 g/dL   Globulin, Total 2.6 1.5 - 4.5 g/dL   Albumin/Globulin Ratio 1.7 1.2 - 2.2   Bilirubin Total 0.4 0.0 - 1.2 mg/dL   Alkaline Phosphatase 68 39 - 117 IU/L   AST 18 0 - 40 IU/L   ALT 14 0 - 44 IU/L  Bayer DCA Hb A1c Waived  Result Value Ref Range   Bayer DCA Hb A1c Waived 7.3 (H) <7.0 %      Assessment & Plan:   Problem List Items Addressed This Visit      Unprioritized   Essential hypertension, benign    Stable, continue present medications.        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  BS is 110 in the AM so will keep  Lantus at present dose.  Increase Glipizide to 10 mg BID.        Relevant Medications   glipiZIDE (GLUCOTROL) 10 MG tablet   Other Relevant Orders   Comprehensive metabolic panel   Bayer DCA Hb A1c Waived    Other Visit Diagnoses    Hematuria, unspecified type    -  Primary   Relevant Orders   UA/M w/rflx Culture, Routine   Ambulatory referral to Urology   Urinary tract infection with hematuria, site unspecified       Positive leukocytes with hematuria.  Will refer to Urology for evaluation   Relevant Orders   Ambulatory referral to Urology       Follow up plan: Return in about 3 months (around 09/12/2017).

## 2017-06-14 NOTE — Assessment & Plan Note (Signed)
Hgb A1C is 8.8%.  BS is 110 in the AM so will keep Lantus at present dose.  Increase Glipizide to 10 mg BID.

## 2017-06-14 NOTE — Assessment & Plan Note (Signed)
Stable, continue present medications.   

## 2017-06-15 LAB — COMPREHENSIVE METABOLIC PANEL
ALBUMIN: 4.4 g/dL (ref 3.5–4.8)
ALK PHOS: 72 IU/L (ref 39–117)
ALT: 20 IU/L (ref 0–44)
AST: 22 IU/L (ref 0–40)
Albumin/Globulin Ratio: 1.6 (ref 1.2–2.2)
BILIRUBIN TOTAL: 0.4 mg/dL (ref 0.0–1.2)
BUN / CREAT RATIO: 16 (ref 10–24)
BUN: 24 mg/dL (ref 8–27)
CHLORIDE: 101 mmol/L (ref 96–106)
CO2: 21 mmol/L (ref 20–29)
Calcium: 9.1 mg/dL (ref 8.6–10.2)
Creatinine, Ser: 1.5 mg/dL — ABNORMAL HIGH (ref 0.76–1.27)
GFR calc Af Amer: 52 mL/min/{1.73_m2} — ABNORMAL LOW (ref >59)
GFR calc non Af Amer: 45 mL/min/{1.73_m2} — ABNORMAL LOW (ref >59)
GLOBULIN, TOTAL: 2.7 g/dL (ref 1.5–4.5)
GLUCOSE: 265 mg/dL — AB (ref 65–99)
Potassium: 4.6 mmol/L (ref 3.5–5.2)
SODIUM: 139 mmol/L (ref 134–144)
Total Protein: 7.1 g/dL (ref 6.0–8.5)

## 2017-06-18 LAB — MICROSCOPIC EXAMINATION: WBC, UA: >30 /hpf — AB (ref 0–?)

## 2017-06-18 LAB — UA/M W/RFLX CULTURE, ROUTINE
Bilirubin, UA: NEGATIVE
Ketones, UA: NEGATIVE
Nitrite, UA: POSITIVE — AB
PH UA: 6.5 (ref 5.0–7.5)
Specific Gravity, UA: 1.02 (ref 1.005–1.030)
UUROB: 0.2 mg/dL (ref 0.2–1.0)

## 2017-06-18 LAB — URINE CULTURE, REFLEX

## 2017-07-01 ENCOUNTER — Ambulatory Visit: Payer: Medicare HMO

## 2017-07-01 ENCOUNTER — Ambulatory Visit: Payer: Medicare HMO | Admitting: Urology

## 2017-07-01 NOTE — Progress Notes (Deleted)
07/01/2017 9:31 AM   Scott Sarah Talarico Jr. August 27, 1940 [id number]  Referring provider: Kathrine Haddock, NP [address], [place] [address]  No chief complaint on file.   HPI: 77 year old male previously followed for history of BPH and elevated PSA who returns the office today for further evaluation of recent urinary tract infection.    He presented to his primary care office 2 weeks ago with dark urine and increased irritative voiding symptoms including nocturia.  His UA was frankly positive for nitrates, heme, and leukocytes.  Urine culture ultimately grew staph epidermidis.  He was treated with doxycycline.     PMH: Past Medical History:  Diagnosis Date  . BPH (benign prostatic hyperplasia)   . Diabetes mellitus without complication ([place])   . Elevated PSA   . GERD (gastroesophageal reflux disease)   . Hypertension   . Hypertriglyceridemia   . Nocturia     Surgical History: Past Surgical History:  Procedure Laterality Date  . APPENDECTOMY    . CARPAL TUNNEL RELEASE Left 12/11/2015   Procedure: CARPAL TUNNEL RELEASE;  Surgeon: Earnestine Leys, MD;  Location: [facility] ORS;  Service: Orthopedics;  Laterality: Left;  . EYE SURGERY      Home Medications:  Allergies as of 07/01/2017      Reactions   Gabapentin Other (See Comments)   "made me fall out"      Medication List        Accurate as of 07/01/17  9:31 AM. Always use your most recent med list.          ACCU-CHEK FASTCLIX LANCETS Misc Use to check blood sugar twice daily   ACCU-CHEK NANO SMARTVIEW w/Device Kit   aspirin 81 MG tablet Take 81 mg by mouth daily.   atorvastatin 40 MG tablet Commonly known as:  LIPITOR Take 1 tablet (40 mg total) by mouth daily.   B-D SINGLE USE SWABS REGULAR Pads   doxycycline 100 MG tablet Commonly known as:  VIBRA-TABS Take 1 tablet (100 mg total) by mouth 2 (two) times daily.   finasteride 5 MG tablet Commonly known as:  PROSCAR TAKE 1 TABLET EVERY DAY     fluticasone 50 MCG/ACT nasal spray Commonly known as:  FLONASE Place 2 sprays into both nostrils daily as needed.   glipiZIDE 10 MG tablet Commonly known as:  GLUCOTROL Take 1 tablet (10 mg total) by mouth 2 (two) times daily before a meal.   glucose blood test strip 1 each by Other route as needed for other. Use as instructed   insulin glargine 100 UNIT/ML injection Commonly known as:  LANTUS Inject 35 Units into the skin at bedtime.   lisinopril 20 MG tablet Commonly known as:  PRINIVIL,ZESTRIL Take 1 tablet (20 mg total) by mouth daily.   meloxicam 15 MG tablet Commonly known as:  MOBIC Take 15 mg by mouth daily.   metFORMIN 500 MG tablet Commonly known as:  GLUCOPHAGE Take 1 tablet (500 mg total) by mouth 2 (two) times daily with a meal.   omeprazole 20 MG capsule Commonly known as:  PRILOSEC Take 1 capsule (20 mg total) by mouth daily.   tamsulosin 0.4 MG Caps capsule Commonly known as:  FLOMAX TAKE 1 CAPSULE EVERY DAY       Allergies:  Allergies  Allergen Reactions  . Gabapentin Other (See Comments)    "made me fall out"    Family History: Family History  Problem Relation Age of Onset  . Cancer Mother  lung  . Heart disease Father   . Diabetes Brother     Social History:  reports that  has never smoked. he has never used smokeless tobacco. He reports that he does not drink alcohol or use drugs.  ROS:                                        Physical Exam: There were no vitals taken for this visit.  Constitutional:  Alert and oriented, No acute distress. HEENT: [place] AT, moist mucus membranes.  Trachea midline, no masses. Cardiovascular: No clubbing, cyanosis, or edema. Respiratory: Normal respiratory effort, no increased work of breathing. GI: Abdomen is soft, nontender, nondistended, no abdominal masses GU: No CVA tenderness. *** Skin: No rashes, bruises or suspicious lesions. Lymph: No cervical or inguinal  adenopathy. Neurologic: Grossly intact, no focal deficits, moving all 4 extremities. Psychiatric: Normal mood and affect.  Laboratory Data: Lab Results  Component Value Date   WBC 6.7 12/01/2016   HGB 13.5 12/01/2016   HCT 40.4 12/01/2016   MCV 88 12/01/2016   PLT 253 12/01/2016    Lab Results  Component Value Date   CREATININE 1.50 (H) 06/14/2017    Lab Results  Component Value Date   PSA1 1.9 06/02/2016   PSA1 1.6 06/28/2015    No results found for: TESTOSTERONE  Lab Results  Component Value Date   HGBA1C 7.1% 03/03/2016    Urinalysis Lab Results  Component Value Date   SPECGRAV 1.020 06/14/2017   PHUR 6.5 06/14/2017   COLORU Yellow 06/14/2017   APPEARANCEUR Turbid (A) 06/14/2017   LEUKOCYTESUR 1+ (A) 06/14/2017   PROTEINUR 1+ (A) 06/14/2017   GLUCOSEU 3+ (A) 06/14/2017   KETONESU Negative 06/14/2017   RBCU 2+ (A) 06/14/2017   BILIRUBINUR Negative 06/14/2017   UUROB 0.2 06/14/2017   NITRITE Positive (A) 06/14/2017    Lab Results  Component Value Date   LABMICR See below: 06/14/2017   WBCUA >30 (A) 06/14/2017   RBCUA 3-10 (A) 06/14/2017   LABEPIT 0-10 06/14/2017   MUCUS Present 08/13/2015   BACTERIA Few 06/14/2017    Pertinent Imaging: *** No results found for this or any previous visit. No results found for this or any previous visit. No results found for this or any previous visit. No results found for this or any previous visit. No results found for this or any previous visit. No results found for this or any previous visit. No results found for this or any previous visit. No results found for this or any previous visit.  Assessment & Plan:  ***  There are no diagnoses linked to this encounter.  No Follow-up on file.  Hollice Espy, MD  [facility] [address], [place], [place] [address] [phone]

## 2017-07-06 ENCOUNTER — Encounter: Payer: Self-pay | Admitting: Urology

## 2017-07-06 ENCOUNTER — Ambulatory Visit (INDEPENDENT_AMBULATORY_CARE_PROVIDER_SITE_OTHER): Payer: Medicare HMO | Admitting: Urology

## 2017-07-06 VITALS — BP 160/80 | HR 80 | Ht 68.0 in | Wt 244.6 lb

## 2017-07-06 DIAGNOSIS — R339 Retention of urine, unspecified: Secondary | ICD-10-CM | POA: Diagnosis not present

## 2017-07-06 DIAGNOSIS — N35811 Other urethral stricture, male, meatal: Secondary | ICD-10-CM

## 2017-07-06 DIAGNOSIS — N289 Disorder of kidney and ureter, unspecified: Secondary | ICD-10-CM

## 2017-07-06 DIAGNOSIS — R319 Hematuria, unspecified: Secondary | ICD-10-CM | POA: Diagnosis not present

## 2017-07-06 LAB — URINALYSIS, COMPLETE
Bilirubin, UA: NEGATIVE
KETONES UA: NEGATIVE
Leukocytes, UA: NEGATIVE
NITRITE UA: NEGATIVE
Protein, UA: NEGATIVE
SPEC GRAV UA: 1.025 (ref 1.005–1.030)
UUROB: 0.2 mg/dL (ref 0.2–1.0)
pH, UA: 5.5 (ref 5.0–7.5)

## 2017-07-06 LAB — MICROSCOPIC EXAMINATION

## 2017-07-06 LAB — BLADDER SCAN AMB NON-IMAGING: SCAN RESULT: 546

## 2017-07-06 NOTE — Progress Notes (Signed)
Cath Change/ Replacement  Patient is present today for a catheter change due to urinary retention. Patient was cleaned and prepped in a sterile fashion with betadine and 2% lidocaine jelly was instilled into the urethra. After dilation done by Dr. Erlene Quan A 16 FR foley cath was replaced into the bladder no complications were noted Urine return was noted 660ml and urine was yellow in color. The balloon was filled with 64ml of sterile water. A leg bag was attached for drainage.  A night bag was also given to the patient and patient was given instruction on how to change from one bag to another. Patient was given proper instruction on catheter care.    Preformed by: Dr. Erlene Quan, [place], [place]  Follow up: 1 week TOV

## 2017-07-06 NOTE — Progress Notes (Signed)
07/06/2017 12:59 PM   Scott Sarah Scouten Jr. [birth year] [id number]  Referring provider: Kathrine Haddock, NP [address], [place] [address]  Chief Complaint  Patient presents with  . Hematuria    HPI: 77 year old previously known to our clinic with a history of BPH who returns today for further evaluation of hematuria.  He was seen and evaluated by his PCP on 06/14/2017 with dark urine without other symptoms.  His UA appeared to be suspicious at the time.  Urine culture ultimately grew staph epidermidis to flora quinolones, penicillin, and sulfa.  UA today not suspicious.    He is previously on finasteride and Flomax,.  This presumably for short period of time and these medications have at least as of 03/2017.  Patient is a somewhat poor historian regarding if and when he stopped these medications.  He does have a history of remotely elevated PSA, although his most recent PSA was 1.9 on 05/2016 and further PSA screening has been discontinued.  He is also noted to have an acute rise in his creatinine.  His most recent creatinine on 06/14/2017 was 1.5 up from his previous baseline of 1.17 just 4 months ago.  PVR today signficatly elevated.    IPSS    Row Name 07/06/17 0800         International Prostate Symptom Score   How often have you had the sensation of not emptying your bladder?  Not at All     How often have you had to urinate less than every two hours?  Less than 1 in 5 times     How often have you found you stopped and started again several times when you urinated?  Not at All     How often have you found it difficult to postpone urination?  Not at All     How often have you had a weak urinary stream?  Not at All     How often have you had to strain to start urination?  Not at All     How many times did you typically get up at night to urinate?  2 Times     Total IPSS Score  3       Quality of Life due to urinary symptoms   If you were to spend the rest of your life  with your urinary condition just the way it is now how would you feel about that?  Mostly Satisfied        Score:  1-7 Mild 8-19 Moderate 20-35 Severe   PMH: Past Medical History:  Diagnosis Date  . BPH (benign prostatic hyperplasia)   . Diabetes mellitus without complication ([place])   . Elevated PSA   . GERD (gastroesophageal reflux disease)   . Hypertension   . Hypertriglyceridemia   . Nocturia     Surgical History: Past Surgical History:  Procedure Laterality Date  . APPENDECTOMY    . CARPAL TUNNEL RELEASE Left 12/11/2015   Procedure: CARPAL TUNNEL RELEASE;  Surgeon: Earnestine Leys, MD;  Location: [facility] ORS;  Service: Orthopedics;  Laterality: Left;  . EYE SURGERY      Home Medications:  Allergies as of 07/06/2017      Reactions   Gabapentin Other (See Comments)   "made me fall out"      Medication List        Accurate as of 07/06/17 12:59 PM. Always use your most recent med list.          ACCU-CHEK FASTCLIX  LANCETS Misc Use to check blood sugar twice daily   ACCU-CHEK NANO SMARTVIEW w/Device Kit   aspirin 81 MG tablet Take 81 mg by mouth daily.   atorvastatin 40 MG tablet Commonly known as:  LIPITOR Take 1 tablet (40 mg total) by mouth daily.   B-D SINGLE USE SWABS REGULAR Pads   doxycycline 100 MG tablet Commonly known as:  VIBRA-TABS Take 1 tablet (100 mg total) by mouth 2 (two) times daily.   finasteride 5 MG tablet Commonly known as:  PROSCAR TAKE 1 TABLET EVERY DAY   fluticasone 50 MCG/ACT nasal spray Commonly known as:  FLONASE Place 2 sprays into both nostrils daily as needed.   glipiZIDE 10 MG tablet Commonly known as:  GLUCOTROL Take 1 tablet (10 mg total) by mouth 2 (two) times daily before a meal.   glucose blood test strip 1 each by Other route as needed for other. Use as instructed   insulin glargine 100 UNIT/ML injection Commonly known as:  LANTUS Inject 35 Units into the skin at bedtime.   lisinopril 20 MG tablet Commonly  known as:  PRINIVIL,ZESTRIL Take 1 tablet (20 mg total) by mouth daily.   meloxicam 15 MG tablet Commonly known as:  MOBIC Take 15 mg by mouth daily.   metFORMIN 500 MG tablet Commonly known as:  GLUCOPHAGE Take 1 tablet (500 mg total) by mouth 2 (two) times daily with a meal.   omeprazole 20 MG capsule Commonly known as:  PRILOSEC Take 1 capsule (20 mg total) by mouth daily.   tamsulosin 0.4 MG Caps capsule Commonly known as:  FLOMAX TAKE 1 CAPSULE EVERY DAY       Allergies:  Allergies  Allergen Reactions  . Gabapentin Other (See Comments)    "made me fall out"    Family History: Family History  Problem Relation Age of Onset  . Cancer Mother        lung  . Heart disease Father   . Diabetes Brother   . Prostate cancer Neg Hx   . Bladder Cancer Neg Hx   . Kidney cancer Neg Hx     Social History:  reports that  has never smoked. he has never used smokeless tobacco. He reports that he does not drink alcohol or use drugs.  ROS: UROLOGY Frequent Urination?: No Hard to postpone urination?: No Burning/pain with urination?: No Get up at night to urinate?: Yes Leakage of urine?: No Urine stream starts and stops?: No Trouble starting stream?: No Do you have to strain to urinate?: No Blood in urine?: Yes Urinary tract infection?: No Sexually transmitted disease?: No Injury to kidneys or bladder?: No Painful intercourse?: No Weak stream?: No Erection problems?: No Penile pain?: No  Gastrointestinal Nausea?: No Vomiting?: No Indigestion/heartburn?: No Diarrhea?: No Constipation?: No  Constitutional Fever: No Night sweats?: No Weight loss?: No Fatigue?: No  Skin Skin rash/lesions?: No Itching?: No  Eyes Blurred vision?: No Double vision?: No  Ears/Nose/Throat Sore throat?: No Sinus problems?: No  Hematologic/Lymphatic Swollen glands?: No Easy bruising?: No  Cardiovascular Leg swelling?: No Chest pain?: No  Respiratory Cough?:  No Shortness of breath?: No  Endocrine Excessive thirst?: No  Musculoskeletal Back pain?: No Joint pain?: Yes  Neurological Headaches?: No Dizziness?: No  Psychologic Depression?: No Anxiety?: No  Physical Exam: BP (!) 160/80 (BP Location: Left Arm, Patient Position: Sitting, Cuff Size: Large)   Pulse 80   Ht _0  (1.727 m)   Wt 244 lb 9.6 oz (110.9 kg)  BMI 37.19 kg/m   Constitutional:  Alert and oriented, No acute distress. HEENT: [place] AT, moist mucus membranes.  Trachea midline, no masses. Cardiovascular: No clubbing, cyanosis, or edema. Respiratory: Normal respiratory effort, no increased work of breathing. GI: Abdomen is soft, nontender, nondistended, no abdominal masses GU: Somewhat hypopigmented glans with a narrow urethral meatus. Skin: No rashes, bruises or suspicious lesions. Neurologic: Grossly intact, no focal deficits, moving all 4 extremities. Psychiatric: Normal mood and affect.  Laboratory Data: Lab Results  Component Value Date   WBC 6.7 12/01/2016   HGB 13.5 12/01/2016   HCT 40.4 12/01/2016   MCV 88 12/01/2016   PLT 253 12/01/2016    Lab Results  Component Value Date   CREATININE 1.50 (H) 06/14/2017    Lab Results  Component Value Date   PSA1 1.9 06/02/2016   PSA1 1.6 06/28/2015    Lab Results  Component Value Date   HGBA1C 7.1% 03/03/2016    Urinalysis Pertinent Imaging: Results for orders placed or performed in visit on 07/06/17  Microscopic Examination  Result Value Ref Range   WBC, UA 0-5 0 - 5 /hpf   RBC, UA 0-2 0 - 2 /hpf   Epithelial Cells (non renal) 0-10 0 - 10 /hpf   Mucus, UA Present (A) Not Estab.   Bacteria, UA Few (A) None seen/Few  Urinalysis, Complete  Result Value Ref Range   Specific Gravity, UA 1.025 1.005 - 1.030   pH, UA 5.5 5.0 - 7.5   Color, UA Yellow Yellow   Appearance Ur Cloudy (A) Clear   Leukocytes, UA Negative Negative   Protein, UA Negative Negative/Trace   Glucose, UA Trace (A) Negative    Ketones, UA Negative Negative   RBC, UA Trace (A) Negative   Bilirubin, UA Negative Negative   Urobilinogen, Ur 0.2 0.2 - 1.0 mg/dL   Nitrite, UA Negative Negative   Microscopic Examination See below:   Bladder Scan (Post Void Residual) in office  Result Value Ref Range   Scan Result 546     Patient noted to be in urinary retention today.  Attempts at placement of a 60 French catheter or not possible by the nurse today given presence of meatal stenosis/fossa navicularis stricture.  At this point in time, the patient was prepped in the standard sterile fashion.  A wire was placed through the meatus into the midshaft.  The fossa navicularis was then serially dilated from a 12 [place] up to an 7 [place] without difficulty.  A 16 French was then able to be placed independent of the wire with some resistance at the meatus.  Greater than 500 cc of urine was drained from the bladder.  Assessment & Plan:    1. Hematuria, unspecified type Likely related to recent infection Adequately treated with doxycycline No evidence of residual blood or ongoing infection today - Urinalysis, Complete  2. Urinary retention Although relatively asymptomatic, bladder scan today indicated that he had greater than 500 cc in his bladder This is likely the source of his recent UTI (incomplete emptying) Recommend maintain Foley catheter for 1 week with a voiding trial next week If he is unsuccessful, he will need to learn CIC We will tentatively plan for cystoscopy, TRUS volume prostate sizing as he is currently on maximal medical management with urinary retention, may be candidate for outlet procedure - Bladder Scan (Post Void Residual) in office  3. Other urethral stricture, male, meatal Meatal/fossa navicularis narrowing requiring dilation for catheter placement today This may be  contributing to his retention although suspect underlying BPH  4. Acute renal insufficiency Creatinine recently 1.5 from his baseline  of 1.17 Incomplete bladder emptying may be contributing to this Catheter placed today   Return for VT next week, MD visit in 2 weeks for possible TRUS/ cysto.  Hollice Espy, MD  [facility] [address] [place], [place] [address] [phone]

## 2017-07-13 ENCOUNTER — Ambulatory Visit: Payer: Medicare HMO

## 2017-07-13 VITALS — BP 176/83 | HR 69 | Ht 68.0 in | Wt 242.9 lb

## 2017-07-13 DIAGNOSIS — N400 Enlarged prostate without lower urinary tract symptoms: Secondary | ICD-10-CM

## 2017-07-13 LAB — BLADDER SCAN AMB NON-IMAGING: Scan Result: 489

## 2017-07-13 NOTE — Progress Notes (Unsigned)
Fill and Pull Catheter Removal  Patient is present today for a catheter removal.  Patient was cleaned and prepped in a sterile fashion 280ml of sterile water/ saline was instilled into the bladder when the patient felt the urge to urinate. 72ml of water was then drained from the balloon.  A 16FR foley cath was removed from the bladder with no complications .  Patient as then given some time to void on their own.  Patient cannot void  241ml on their own after some time.  Patient tolerated well.  Preformed by: C. Corinna Capra, CMA  Follow up/ Additional notes: Instructed to drink plenty of fluids and to return at 3pm for bladder scan.  Continuous Intermittent Catheterization  Due to urinary retention patient is present today for a teaching of self I & O Catheterization. Patient was given detailed verbal and printed instructions of self catheterization. Patient was cleaned and prepped in a sterile fashion.  With instruction and assistance patient inserted a 14FR and urine return was noted 500 ml, urine was yellow in color. Patient tolerated well, no complications were noted Patient was given a sample bag with supplies to take home.  Instructions were given per Dr. Erlene Quan for patient to cath 4-6 times daily.  An order was placed with [facility] for catheters to be sent to the patient's home. Patient is to follow up with Dr. Erlene Quan.  Preformed by: Toniann Fail, LPN

## 2017-07-16 ENCOUNTER — Ambulatory Visit (INDEPENDENT_AMBULATORY_CARE_PROVIDER_SITE_OTHER): Payer: Medicare HMO | Admitting: Unknown Physician Specialty

## 2017-07-16 ENCOUNTER — Encounter: Payer: Self-pay | Admitting: Unknown Physician Specialty

## 2017-07-16 VITALS — BP 134/83 | HR 77 | Temp 98.0°F | Wt 246.8 lb

## 2017-07-16 DIAGNOSIS — R339 Retention of urine, unspecified: Secondary | ICD-10-CM | POA: Diagnosis not present

## 2017-07-16 DIAGNOSIS — Z0100 Encounter for examination of eyes and vision without abnormal findings: Secondary | ICD-10-CM

## 2017-07-16 DIAGNOSIS — E119 Type 2 diabetes mellitus without complications: Secondary | ICD-10-CM | POA: Diagnosis not present

## 2017-07-16 DIAGNOSIS — N183 Chronic kidney disease, stage 3 unspecified: Secondary | ICD-10-CM

## 2017-07-16 LAB — UA/M W/RFLX CULTURE, ROUTINE
Bilirubin, UA: NEGATIVE
Ketones, UA: NEGATIVE
LEUKOCYTES UA: NEGATIVE
Nitrite, UA: NEGATIVE
PH UA: 5 (ref 5.0–7.5)
PROTEIN UA: NEGATIVE
Specific Gravity, UA: 1.02 (ref 1.005–1.030)
UUROB: 0.2 mg/dL (ref 0.2–1.0)

## 2017-07-16 LAB — MICROSCOPIC EXAMINATION: Bacteria, UA: NONE SEEN

## 2017-07-16 LAB — MICROALBUMIN, URINE WAIVED
Creatinine, Urine Waived: 100 mg/dL (ref 10–300)
MICROALB, UR WAIVED: 10 mg/L (ref 0–19)
Microalb/Creat Ratio: <30 mg/g (ref <30)

## 2017-07-16 NOTE — Progress Notes (Signed)
   BP 134/83   Pulse 77   Temp 98 F (36.7 C) (Oral)   Wt 246 lb 12.8 oz (111.9 kg)   SpO2 96%   BMI 37.53 kg/m    Subjective:    Patient ID: Scott Apley., male    DOB: 1941-06-02, 77 y.o.   MRN: [id number]  HPI: Scott Marchio. is a 77 y.o. male  Chief Complaint  Patient presents with  . Diabetes    1 month f/up- pt states last eye exam was about a year and a half ago    CKD Pt is here for f/u on low kidney functions today.  Did have a UTI last visit and treated with Doxycycline.  He is seeing Urology for BPH.    Relevant past medical, surgical, family and social history reviewed and updated as indicated. Interim medical history since our last visit reviewed. Allergies and medications reviewed and updated.  Review of Systems  Per HPI unless specifically indicated above     Objective:    BP 134/83   Pulse 77   Temp 98 F (36.7 C) (Oral)   Wt 246 lb 12.8 oz (111.9 kg)   SpO2 96%   BMI 37.53 kg/m   Wt Readings from Last 3 Encounters:  07/16/17 246 lb 12.8 oz (111.9 kg)  07/13/17 242 lb 14.4 oz (110.2 kg)  07/06/17 244 lb 9.6 oz (110.9 kg)    Physical Exam  Constitutional: He is oriented to person, place, and time. He appears well-developed and well-nourished. No distress.  HENT:  Head: Normocephalic and atraumatic.  Eyes: Conjunctivae and lids are normal. Right eye exhibits no discharge. Left eye exhibits no discharge. No scleral icterus.  Neck: Normal range of motion. Neck supple. No JVD present. Carotid bruit is not present.  Cardiovascular: Normal rate, regular rhythm and normal heart sounds.  Pulmonary/Chest: Effort normal and breath sounds normal. No respiratory distress.  Abdominal: Normal appearance. There is no splenomegaly or hepatomegaly.  Musculoskeletal: Normal range of motion.  Neurological: He is alert and oriented to person, place, and time.  Skin: Skin is warm, dry and intact. No rash noted. No pallor.  Psychiatric: He has  a normal mood and affect. His behavior is normal. Judgment and thought content normal.    Results for orders placed or performed in visit on 07/13/17  Bladder Scan (Post Void Residual) in office  Result Value Ref Range   Scan Result 489 ml       Assessment & Plan:   Problem List Items Addressed This Visit    None    Visit Diagnoses    Chronic kidney disease, stage 3 ([place])    -  Primary   Relevant Orders   Microalbumin, Urine Waived   Comprehensive metabolic panel   UA/M w/rflx Culture, Routine   Ambulatory referral to Ophthalmology   Diabetic eye exam [facility])       Relevant Orders   Ambulatory referral to Ophthalmology       Follow up plan: Return in about 2 months (around 09/13/2017).

## 2017-07-16 NOTE — Addendum Note (Signed)
Addended by: Crissie Figures R on: 07/16/2017 10:15 AM   Modules accepted: Orders

## 2017-07-17 LAB — COMPREHENSIVE METABOLIC PANEL
ALT: 24 IU/L (ref 0–44)
AST: 22 IU/L (ref 0–40)
Albumin/Globulin Ratio: 1.8 (ref 1.2–2.2)
Albumin: 4.5 g/dL (ref 3.5–4.8)
Alkaline Phosphatase: 66 IU/L (ref 39–117)
BUN/Creatinine Ratio: 22 (ref 10–24)
BUN: 26 mg/dL (ref 8–27)
Bilirubin Total: 0.4 mg/dL (ref 0.0–1.2)
CALCIUM: 10 mg/dL (ref 8.6–10.2)
CO2: 21 mmol/L (ref 20–29)
Chloride: 101 mmol/L (ref 96–106)
Creatinine, Ser: 1.2 mg/dL (ref 0.76–1.27)
GFR calc Af Amer: 67 mL/min/{1.73_m2} (ref >59)
GFR, EST NON AFRICAN AMERICAN: 58 mL/min/{1.73_m2} — AB (ref >59)
GLOBULIN, TOTAL: 2.5 g/dL (ref 1.5–4.5)
Glucose: 144 mg/dL — ABNORMAL HIGH (ref 65–99)
POTASSIUM: 4.9 mmol/L (ref 3.5–5.2)
SODIUM: 141 mmol/L (ref 134–144)
Total Protein: 7 g/dL (ref 6.0–8.5)

## 2017-07-21 DIAGNOSIS — E119 Type 2 diabetes mellitus without complications: Secondary | ICD-10-CM | POA: Diagnosis not present

## 2017-07-21 LAB — HM DIABETES EYE EXAM

## 2017-07-22 ENCOUNTER — Encounter: Payer: Self-pay | Admitting: Urology

## 2017-07-22 ENCOUNTER — Ambulatory Visit (INDEPENDENT_AMBULATORY_CARE_PROVIDER_SITE_OTHER): Payer: Medicare HMO | Admitting: Urology

## 2017-07-22 VITALS — BP 131/71 | HR 66 | Ht 68.0 in | Wt 245.0 lb

## 2017-07-22 DIAGNOSIS — N400 Enlarged prostate without lower urinary tract symptoms: Secondary | ICD-10-CM | POA: Diagnosis not present

## 2017-07-22 DIAGNOSIS — N138 Other obstructive and reflux uropathy: Secondary | ICD-10-CM

## 2017-07-22 DIAGNOSIS — N401 Enlarged prostate with lower urinary tract symptoms: Secondary | ICD-10-CM

## 2017-07-22 LAB — URINALYSIS, COMPLETE
Bilirubin, UA: NEGATIVE
GLUCOSE, UA: NEGATIVE
Ketones, UA: NEGATIVE
Leukocytes, UA: NEGATIVE
Nitrite, UA: NEGATIVE
Protein, UA: NEGATIVE
Specific Gravity, UA: 1.02 (ref 1.005–1.030)
Urobilinogen, Ur: 0.2 mg/dL (ref 0.2–1.0)
pH, UA: 5.5 (ref 5.0–7.5)

## 2017-07-22 LAB — MICROSCOPIC EXAMINATION: Epithelial Cells (non renal): NONE SEEN /hpf (ref 0–10)

## 2017-07-22 LAB — BLADDER SCAN AMB NON-IMAGING

## 2017-07-22 NOTE — Progress Notes (Signed)
   07/22/17  CC:  Chief Complaint  Patient presents with  . Cysto  . TRUS    HPI: 77 year old male with a history of urinary retention, bladder outlet obstruction who presents today for cystoscopy and prostate sizing.  Blood pressure 131/71, pulse 66, height 5\' 8"  (1.727 m), weight 245 lb (111.1 kg). NED. A&Ox3.   No respiratory distress   Abd soft, NT, ND Normal phallus with bilateral descended testicles  Cystoscopy Procedure Note  Patient identification was confirmed, informed consent was obtained, and patient was prepped using Betadine solution.  Lidocaine jelly was administered per urethral meatus.    Preoperative abx where received prior to procedure.     Pre-Procedure: - Inspection reveals a normal caliber ureteral meatus.  Procedure: The flexible cystoscope was introduced without difficulty - No urethral strictures/lesions are present. - Enlarged prostate bilobar coaptation - Minimally elevated bladder neck - Bilateral ureteral orifices identified - Bladder mucosa  reveals no ulcers, tumors, or lesions - No bladder stones - Heavily trabeculated with multiple saccules  Retroflexion shows minimal intravesical protrusion, no decrete median lobe   Post-Procedure: - Patient tolerated the procedure well  Transrectal ultrasound: Patient was positioned in the lateral decubitus position.  G it was applied per rectum.  Transrectal ultrasound probe was placed without difficulty.  Prostate was visualized.  It was relatively unremarkable with some diffuse nonspecific calcifications.  There were no hypoechoic lesions appreciated.  Prostate measured 45 cc (4.75 x 3.44 x 5.28 cm in length).  Procedure was well-tolerated  Assessment/ Plan:  1. Benign prostatic hyperplasia with urinary obstruction Return next week to discuss findings, surgical planning Continue finasteride/Flomax Preop urine culture sent today in anticipation of probable outlet procedure - Urinalysis,  Complete - BLADDER SCAN AMB NON-IMAGING - CULTURE, URINE COMPREHENSIVE  Hollice Espy, MD

## 2017-07-27 ENCOUNTER — Ambulatory Visit (INDEPENDENT_AMBULATORY_CARE_PROVIDER_SITE_OTHER): Payer: Medicare HMO | Admitting: Urology

## 2017-07-27 ENCOUNTER — Encounter: Payer: Self-pay | Admitting: Urology

## 2017-07-27 ENCOUNTER — Other Ambulatory Visit: Payer: Self-pay | Admitting: Radiology

## 2017-07-27 VITALS — BP 145/63 | HR 91 | Ht 69.0 in | Wt 240.0 lb

## 2017-07-27 DIAGNOSIS — N401 Enlarged prostate with lower urinary tract symptoms: Secondary | ICD-10-CM | POA: Diagnosis not present

## 2017-07-27 DIAGNOSIS — N3000 Acute cystitis without hematuria: Secondary | ICD-10-CM

## 2017-07-27 DIAGNOSIS — N320 Bladder-neck obstruction: Secondary | ICD-10-CM

## 2017-07-27 DIAGNOSIS — R338 Other retention of urine: Secondary | ICD-10-CM

## 2017-07-27 DIAGNOSIS — N138 Other obstructive and reflux uropathy: Secondary | ICD-10-CM

## 2017-07-27 LAB — CULTURE, URINE COMPREHENSIVE

## 2017-07-27 MED ORDER — CIPROFLOXACIN HCL 500 MG PO TABS: 500.0000 mg | ORAL_TABLET | Freq: Two times a day (BID) | ORAL | 0 refills | Status: DC

## 2017-07-27 NOTE — Patient Instructions (Signed)
Transurethral Resection of the Prostate Transurethral resection of the prostate (TURP) is the removal (resection) of part of the gland that produces semen (prostate gland). This procedure is done to treat benign prostatic hyperplasia (BPH). BPH is an abnormal, noncancerous (benign) increase in the number of cells that make up the prostate tissue. BPH causes the prostate to get bigger. The enlarged prostate can push against or block the tube that drgains urine from the bladder out of the body (urethra). BPH can affect normal urine flow by causing bladder infections, difficulty controlling bladder function, and difficulty emptying the bladder. The goal of TURP is to remove enough prostate tissue to allow for a normal flow of urine. In a transurethral resection, a thin telescope with a light, a tiny camera, and an electric cutting edge (resectoscope) is passed through the urethra and into the prostate. The opening of the urethra is at the end of the penis. Tell a health care provider about:  Any allergies you have.  All medicines you are taking, including vitamins, herbs, eye drops, creams, and over-the-counter medicines.  Any problems you or family members have had with anesthetic medicines.  Any blood disorders you have.  Any surgeries you have had.  Any medical conditions you have.  Any prostate infections you have had. What are the risks? Generally, this is a safe procedure. However, problems may occur, including:  Infection.  Bleeding.  Allergic reactions to medicines.  Damage to other structures or organs, such as: ? The urethra. ? The bladder. ? Muscles that surround the prostate.  Difficulty getting an erection.  Difficulty controlling urination (incontinence).  Scarring, which may cause problems with urine flow.  What happens before the procedure?  Follow instructions from your health care provider about eating or drinking restrictions.  Ask your health care provider  about: ? Changing or stopping your regular medicines. This is especially important if you are taking diabetes medicines or blood thinners. ? Taking medicines such as aspirin and ibuprofen. These medicines can thin your blood. Do not take these medicines before your procedure if your health care provider instructs you not to.  You may have a physical exam.  You may have a blood or urine sample taken.  You may be given antibiotic medicine to help prevent infection.  Ask your health care provider how your surgical site will be marked or identified.  Plan to have someone take you home after the procedure. You may not be able to drive for up to 10 days after your procedure. What happens during the procedure?  To reduce your risk of infection: ? Your health care team will wash or sanitize their hands. ? Your skin will be washed with soap.  An IV tube will be inserted into one of your veins.  You will be given one or more of the following: ? A medicine to help you relax (sedative). ? A medicine to make you fall asleep (general anesthetic). ? A medicine that is injected into your spine to numb the area below and slightly above the injection site (spinal anesthetic).  Your legs will be placed in foot rests (stirrups) so that your legs are apart and your knees are bent.  The resectoscope will be passed through your urethra to your prostate.  Parts of your prostate will be resected using the cutting edge of the resectoscope.  The resectoscope will be removed.  A thin, flexible tube (catheter) will be passed through your urethra and into your bladder. The catheter will drain  urine into a bag outside of your body. ? Fluid may be passed through the catheter to keep the catheter open. The procedure may vary among health care providers and hospitals. What happens after the procedure?  Your blood pressure, heart rate, breathing rate, and blood oxygen level will be monitored often until the  medicines you were given have worn off.  You may continue to receive fluids and medicines through an IV tube.  You may have some pain. Pain medicine will be available to help you.  You will have a catheter draining your urine. ? You may have blood in your urine. Your catheter may be kept in until your urine is clear. ? Your urinary drainage will be monitored. If necessary, your bladder may be rinsed out (irrigated) through your catheter.  You will be encouraged to walk around as soon as possible.  You may have to wear compression stockings. These stockings help prevent blood clots and reduce swelling in your legs.  Do not drive for 24 hours if you received a sedative. This information is not intended to replace advice given to you by your health care provider. Make sure you discuss any questions you have with your health care provider. Document Released: 06/01/2005 Document Revised: 02/02/2016 Document Reviewed: 02/21/2015 Elsevier Interactive Patient Education  2018 [employer] Inc[employer]  

## 2017-07-27 NOTE — Progress Notes (Signed)
07/27/2017 1:10 PM   Scott Sarah Morga Jr. 07-17-1940 [id number]  Referring provider: Kathrine Haddock, NP [address], [place] [address]  Chief Complaint  Patient presents with  . Benign Prostatic Hypertrophy    HPI: 77 yo M with BPH with BOO, urinary retention on CIC (4-6x daily) to discuss outlet surgery.    He is previously on finasteride and Flomax,.  This presumably for short period of time and these medications have at least as of 03/2017.  Patient is a somewhat poor historian regarding if and when he stopped these medications.  He does have a history of remotely elevated PSA, although his most recent PSA was 1.9 on 05/2016 and further PSA screening has been discontinued.  He is also noted to have an acute rise in his creatinine.  His most recent creatinine on 06/14/2017 was 1.5 up from his previous baseline of 1.17 just 4 months ago.  Cystoscopy showed bilobar coaptation.  His bladder was heavily trabeculated with saccules.  He had minimal intravesical protrusion with no discrete median lobe. TRUS volume 45 cc.    He is extremely anxious to be catheter free and not need to CIC.  PMH: Past Medical History:  Diagnosis Date  . BPH (benign prostatic hyperplasia)   . Diabetes mellitus without complication ([place])   . Elevated PSA   . GERD (gastroesophageal reflux disease)   . Hypertension   . Hypertriglyceridemia   . Nocturia     Surgical History: Past Surgical History:  Procedure Laterality Date  . APPENDECTOMY    . CARPAL TUNNEL RELEASE Left 12/11/2015   Procedure: CARPAL TUNNEL RELEASE;  Surgeon: Earnestine Leys, MD;  Location: [facility] ORS;  Service: Orthopedics;  Laterality: Left;  . EYE SURGERY      Home Medications:  Allergies as of 07/27/2017      Reactions   Gabapentin Other (See Comments)   "made me fall out"      Medication List        Accurate as of 07/27/17  1:10 PM. Always use your most recent med list.          ACCU-CHEK FASTCLIX LANCETS  Misc Use to check blood sugar twice daily   ACCU-CHEK NANO SMARTVIEW w/Device Kit   aspirin 81 MG tablet Take 81 mg by mouth daily.   atorvastatin 40 MG tablet Commonly known as:  LIPITOR Take 1 tablet (40 mg total) by mouth daily.   B-D SINGLE USE SWABS REGULAR Pads   finasteride 5 MG tablet Commonly known as:  PROSCAR TAKE 1 TABLET EVERY DAY   fluticasone 50 MCG/ACT nasal spray Commonly known as:  FLONASE Place 2 sprays into both nostrils daily as needed.   glipiZIDE 10 MG tablet Commonly known as:  GLUCOTROL Take 1 tablet (10 mg total) by mouth 2 (two) times daily before a meal.   glucose blood test strip 1 each by Other route as needed for other. Use as instructed   insulin glargine 100 UNIT/ML injection Commonly known as:  LANTUS Inject 35 Units into the skin at bedtime.   lisinopril 20 MG tablet Commonly known as:  PRINIVIL,ZESTRIL Take 1 tablet (20 mg total) by mouth daily.   meloxicam 15 MG tablet Commonly known as:  MOBIC Take 15 mg by mouth daily.   metFORMIN 500 MG tablet Commonly known as:  GLUCOPHAGE Take 1 tablet (500 mg total) by mouth 2 (two) times daily with a meal.   omeprazole 20 MG capsule Commonly known as:  PRILOSEC Take 1  capsule (20 mg total) by mouth daily.   tamsulosin 0.4 MG Caps capsule Commonly known as:  FLOMAX TAKE 1 CAPSULE EVERY DAY       Allergies:  Allergies  Allergen Reactions  . Gabapentin Other (See Comments)    "made me fall out"    Family History: Family History  Problem Relation Age of Onset  . Cancer Mother        lung  . Heart disease Father   . Diabetes Brother   . Prostate cancer Neg Hx   . Bladder Cancer Neg Hx   . Kidney cancer Neg Hx     Social History:  reports that  has never smoked. he has never used smokeless tobacco. He reports that he does not drink alcohol or use drugs.  ROS: UROLOGY Frequent Urination?: No Hard to postpone urination?: No Burning/pain with urination?: No Get up at  night to urinate?: Yes Leakage of urine?: No Urine stream starts and stops?: No Trouble starting stream?: Yes Do you have to strain to urinate?: No Blood in urine?: Yes Urinary tract infection?: Yes Sexually transmitted disease?: No Injury to kidneys or bladder?: No Painful intercourse?: No Weak stream?: Yes Erection problems?: No Penile pain?: Yes  Gastrointestinal Nausea?: No Vomiting?: No Indigestion/heartburn?: No Diarrhea?: No Constipation?: No  Constitutional Fever: No Night sweats?: No Weight loss?: No Fatigue?: No  Skin Skin rash/lesions?: No Itching?: Yes  Eyes Blurred vision?: No Double vision?: No  Ears/Nose/Throat Sore throat?: No Sinus problems?: No  Hematologic/Lymphatic Swollen glands?: No Easy bruising?: No  Cardiovascular Leg swelling?: No Chest pain?: No  Respiratory Cough?: No Shortness of breath?: No  Endocrine Excessive thirst?: No  Musculoskeletal Back pain?: No Joint pain?: No  Neurological Headaches?: No Dizziness?: No  Psychologic Depression?: No Anxiety?: No  Physical Exam: BP (!) 145/63   Pulse 91   Ht 5' 9"  (1.753 m)   Wt 240 lb (108.9 kg)   BMI 35.44 kg/m   Constitutional:  Alert and oriented, No acute distress. HEENT:  AT, moist mucus membranes.  Trachea midline, no masses. Cardiovascular: No clubbing, cyanosis, or edema. Respiratory: Normal respiratory effort, no increased work of breathing. GU: No CVA tenderness.  Skin: No rashes, bruises or suspicious lesions. Neurologic: Grossly intact, no focal deficits, moving all 4 extremities. Psychiatric: Normal mood and affect.  Laboratory Data: Lab Results  Component Value Date   WBC 6.7 12/01/2016   HGB 13.5 12/01/2016   HCT 40.4 12/01/2016   MCV 88 12/01/2016   PLT 253 12/01/2016    Lab Results  Component Value Date   CREATININE 1.20 07/16/2017    Lab Results  Component Value Date   PSA1 1.9 06/02/2016   PSA1 1.6 06/28/2015    Lab  Results  Component Value Date   HGBA1C 7.1% 03/03/2016    Urinalysis Lab Results  Component Value Date   SPECGRAV 1.020 07/22/2017   PHUR 5.5 07/22/2017   COLORU Yellow 07/22/2017   APPEARANCEUR Clear 07/22/2017   LEUKOCYTESUR Negative 07/22/2017   PROTEINUR Negative 07/22/2017   GLUCOSEU Negative 07/22/2017   KETONESU Negative 07/22/2017   RBCU 2+ (A) 07/22/2017   BILIRUBINUR Negative 07/22/2017   UUROB 0.2 07/22/2017   NITRITE Negative 07/22/2017    Lab Results  Component Value Date   LABMICR See below: 07/22/2017   WBCUA 0-5 07/22/2017   RBCUA 3-10 (A) 07/22/2017   LABEPIT None seen 07/22/2017   MUCUS Present (A) 07/06/2017   BACTERIA Few (A) 07/22/2017   Urine culture from  last week grew enterococcus, 50-100K  Assessment & Plan:    1. 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 Surgical intervention -this includes TURP, Urolift, and laser ablative surgery We also discussed UDS which he declined Most interested in TURP  Risks and benefits of TURP were discussed today in detail.   Risk of bleeding, infection, damage to surrounding structures, retrograde ejaculation, incontinence other and anticipated complications.  Discussed that there is a risk of failure of the procedure especially without preoperative urodynamics to prove that there is not a neurogenic bladder component of his retention.  He understands all this and is willing to proceed.  He will hold his baby aspirin.  2. Acute cystitis without hematuria Positive urine culture, enterococcus We will treat with Cipro twice daily until surgery  Schedule TURP  Hollice Espy, MD  [facility] [address] [place], [place]  [address] [phone]

## 2017-07-27 NOTE — H&P (View-Only) (Signed)
07/27/2017 1:10 PM   Scott Sarah Voyles Jr. [birth year] [id number]  Referring provider: Kathrine Haddock, NP [address], [place] [address]  Chief Complaint  Patient presents with  . Benign Prostatic Hypertrophy    HPI: 77 yo M with BPH with BOO, urinary retention on CIC (4-6x daily) to discuss outlet surgery.    He is previously on finasteride and Flomax,.  This presumably for short period of time and these medications have at least as of 03/2017.  Patient is a somewhat poor historian regarding if and when he stopped these medications.  He does have a history of remotely elevated PSA, although his most recent PSA was 1.9 on 05/2016 and further PSA screening has been discontinued.  He is also noted to have an acute rise in his creatinine.  His most recent creatinine on 06/14/2017 was 1.5 up from his previous baseline of 1.17 just 4 months ago.  Cystoscopy showed bilobar coaptation.  His bladder was heavily trabeculated with saccules.  He had minimal intravesical protrusion with no discrete median lobe. TRUS volume 45 cc.    He is extremely anxious to be catheter free and not need to CIC.  PMH: Past Medical History:  Diagnosis Date  . BPH (benign prostatic hyperplasia)   . Diabetes mellitus without complication ([place])   . Elevated PSA   . GERD (gastroesophageal reflux disease)   . Hypertension   . Hypertriglyceridemia   . Nocturia     Surgical History: Past Surgical History:  Procedure Laterality Date  . APPENDECTOMY    . CARPAL TUNNEL RELEASE Left 12/11/2015   Procedure: CARPAL TUNNEL RELEASE;  Surgeon: Earnestine Leys, MD;  Location: [facility] ORS;  Service: Orthopedics;  Laterality: Left;  . EYE SURGERY      Home Medications:  Allergies as of 07/27/2017      Reactions   Gabapentin Other (See Comments)   "made me fall out"      Medication List        Accurate as of 07/27/17  1:10 PM. Always use your most recent med list.          ACCU-CHEK FASTCLIX LANCETS  Misc Use to check blood sugar twice daily   ACCU-CHEK NANO SMARTVIEW w/Device Kit   aspirin 81 MG tablet Take 81 mg by mouth daily.   atorvastatin 40 MG tablet Commonly known as:  LIPITOR Take 1 tablet (40 mg total) by mouth daily.   B-D SINGLE USE SWABS REGULAR Pads   finasteride 5 MG tablet Commonly known as:  PROSCAR TAKE 1 TABLET EVERY DAY   fluticasone 50 MCG/ACT nasal spray Commonly known as:  FLONASE Place 2 sprays into both nostrils daily as needed.   glipiZIDE 10 MG tablet Commonly known as:  GLUCOTROL Take 1 tablet (10 mg total) by mouth 2 (two) times daily before a meal.   glucose blood test strip 1 each by Other route as needed for other. Use as instructed   insulin glargine 100 UNIT/ML injection Commonly known as:  LANTUS Inject 35 Units into the skin at bedtime.   lisinopril 20 MG tablet Commonly known as:  PRINIVIL,ZESTRIL Take 1 tablet (20 mg total) by mouth daily.   meloxicam 15 MG tablet Commonly known as:  MOBIC Take 15 mg by mouth daily.   metFORMIN 500 MG tablet Commonly known as:  GLUCOPHAGE Take 1 tablet (500 mg total) by mouth 2 (two) times daily with a meal.   omeprazole 20 MG capsule Commonly known as:  PRILOSEC Take 1  capsule (20 mg total) by mouth daily.   tamsulosin 0.4 MG Caps capsule Commonly known as:  FLOMAX TAKE 1 CAPSULE EVERY DAY       Allergies:  Allergies  Allergen Reactions  . Gabapentin Other (See Comments)    "made me fall out"    Family History: Family History  Problem Relation Age of Onset  . Cancer Mother        lung  . Heart disease Father   . Diabetes Brother   . Prostate cancer Neg Hx   . Bladder Cancer Neg Hx   . Kidney cancer Neg Hx     Social History:  reports that  has never smoked. he has never used smokeless tobacco. He reports that he does not drink alcohol or use drugs.  ROS: UROLOGY Frequent Urination?: No Hard to postpone urination?: No Burning/pain with urination?: No Get up at  night to urinate?: Yes Leakage of urine?: No Urine stream starts and stops?: No Trouble starting stream?: Yes Do you have to strain to urinate?: No Blood in urine?: Yes Urinary tract infection?: Yes Sexually transmitted disease?: No Injury to kidneys or bladder?: No Painful intercourse?: No Weak stream?: Yes Erection problems?: No Penile pain?: Yes  Gastrointestinal Nausea?: No Vomiting?: No Indigestion/heartburn?: No Diarrhea?: No Constipation?: No  Constitutional Fever: No Night sweats?: No Weight loss?: No Fatigue?: No  Skin Skin rash/lesions?: No Itching?: Yes  Eyes Blurred vision?: No Double vision?: No  Ears/Nose/Throat Sore throat?: No Sinus problems?: No  Hematologic/Lymphatic Swollen glands?: No Easy bruising?: No  Cardiovascular Leg swelling?: No Chest pain?: No  Respiratory Cough?: No Shortness of breath?: No  Endocrine Excessive thirst?: No  Musculoskeletal Back pain?: No Joint pain?: No  Neurological Headaches?: No Dizziness?: No  Psychologic Depression?: No Anxiety?: No  Physical Exam: BP (!) 145/63   Pulse 91   Ht 5' 9"  (1.753 m)   Wt 240 lb (108.9 kg)   BMI 35.44 kg/m   Constitutional:  Alert and oriented, No acute distress. HEENT: [place] AT, moist mucus membranes.  Trachea midline, no masses. Cardiovascular: No clubbing, cyanosis, or edema. Respiratory: Normal respiratory effort, no increased work of breathing. GU: No CVA tenderness.  Skin: No rashes, bruises or suspicious lesions. Neurologic: Grossly intact, no focal deficits, moving all 4 extremities. Psychiatric: Normal mood and affect.  Laboratory Data: Lab Results  Component Value Date   WBC 6.7 12/01/2016   HGB 13.5 12/01/2016   HCT 40.4 12/01/2016   MCV 88 12/01/2016   PLT 253 12/01/2016    Lab Results  Component Value Date   CREATININE 1.20 07/16/2017    Lab Results  Component Value Date   PSA1 1.9 06/02/2016   PSA1 1.6 06/28/2015    Lab  Results  Component Value Date   HGBA1C 7.1% 03/03/2016    Urinalysis Lab Results  Component Value Date   SPECGRAV 1.020 07/22/2017   PHUR 5.5 07/22/2017   COLORU Yellow 07/22/2017   APPEARANCEUR Clear 07/22/2017   LEUKOCYTESUR Negative 07/22/2017   PROTEINUR Negative 07/22/2017   GLUCOSEU Negative 07/22/2017   KETONESU Negative 07/22/2017   RBCU 2+ (A) 07/22/2017   BILIRUBINUR Negative 07/22/2017   UUROB 0.2 07/22/2017   NITRITE Negative 07/22/2017    Lab Results  Component Value Date   LABMICR See below: 07/22/2017   WBCUA 0-5 07/22/2017   RBCUA 3-10 (A) 07/22/2017   LABEPIT None seen 07/22/2017   MUCUS Present (A) 07/06/2017   BACTERIA Few (A) 07/22/2017   Urine culture from  last week grew enterococcus, 50-100K  Assessment & Plan:    1. 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 Surgical intervention -this includes TURP, Urolift, and laser ablative surgery We also discussed UDS which he declined Most interested in TURP  Risks and benefits of TURP were discussed today in detail.   Risk of bleeding, infection, damage to surrounding structures, retrograde ejaculation, incontinence other and anticipated complications.  Discussed that there is a risk of failure of the procedure especially without preoperative urodynamics to prove that there is not a neurogenic bladder component of his retention.  He understands all this and is willing to proceed.  He will hold his baby aspirin.  2. Acute cystitis without hematuria Positive urine culture, enterococcus We will treat with Cipro twice daily until surgery  Schedule TURP  Hollice Espy, MD  [facility] [address] [place], [place]  [address] [phone]

## 2017-07-29 ENCOUNTER — Encounter
Admission: RE | Admit: 2017-07-29 | Discharge: 2017-07-29 | Disposition: A | Discharge status: Home / Self Care | Payer: Medicare HMO | Source: Ambulatory Visit | Attending: Urology | Admitting: Urology

## 2017-07-29 ENCOUNTER — Other Ambulatory Visit: Payer: Self-pay

## 2017-07-29 DIAGNOSIS — R9431 Abnormal electrocardiogram [ECG] [EKG]: Secondary | ICD-10-CM | POA: Diagnosis not present

## 2017-07-29 DIAGNOSIS — I444 Left anterior fascicular block: Secondary | ICD-10-CM | POA: Diagnosis not present

## 2017-07-29 DIAGNOSIS — I10 Essential (primary) hypertension: Secondary | ICD-10-CM | POA: Diagnosis not present

## 2017-07-29 NOTE — Patient Instructions (Signed)
Your procedure is scheduled on: Mon. 08/02/17 Report to Day Surgery. To find out your arrival time please call [phone] between 1PM - 3PM on Friday 07/30/17.  Remember: Instructions that are not followed completely may result in serious medical risk, up to and including death, or upon the discretion of your surgeon and anesthesiologist your surgery may need to be rescheduled.     _X__ 1. Do not eat food after midnight the night before your procedure.                 No gum chewing or hard candies. You may drink clear liquids up to 2 hours                 before you are scheduled to arrive for your surgery- DO not drink clear                 liquids within 2 hours of the start of your surgery.                water, Black Coffee                  .  __X__2.  On the morning of surgery brush your teeth with toothpaste and water, you  may rinse your mouth with mouthwash if you wish.  Do not swallow any              toothpaste of mouthwash.     __ 3.  No Alcohol for 24 hours before or after surgery.   __ 4.  Do Not Smoke or use e-cigarettes For 24 Hours Prior to Your Surgery.                 Do not use any chewable tobacco products for at least 6 hours prior to                 surgery.  ____  5.  Bring all medications with you on the day of surgery if instructed.   __x__  6.  Notify your doctor if there is any change in your medical condition      (cold, fever, infections).     Do not wear jewelry, make-up, hairpins, clips or nail polish. Do not wear lotions, powders, or perfumes. You may wear deodorant. Do not shave 48 hours prior to surgery. Men may shave face and neck. Do not bring valuables to the hospital.    [facility] is not responsible for any belongings or valuables.  Contacts, dentures or bridgework may not be worn into surgery. Leave your suitcase in the car. After surgery it may be brought to your room. For patients admitted to the hospital,  discharge time is determined by your treatment team.   Patients discharged the day of surgery will not be allowed to drive home.   Please read over the following fact sheets that you were given:    __x__ Take these medicines the morning of surgery with A SIP OF WATER:    1.finasteride (PROSCAR) 5 MG tablet  2. omeprazole (PRILOSEC) 20 MG capsule take extra dose the night before and the morning of surgery  3. tamsulosin (FLOMAX) 0.4 MG CAPS capsule  4.  5.  6.  ____ Fleet Enema (as directed)   ____ Use CHG Soap as directed  ____ Use inhalers on the day of surgery  _x___ Stop metformin 2 days prior to surgery Friday last dose   Stop on Saturday  _x___ Take 1/2 of usual insulin dose the night before surgery. No insulin the morning          of surgery.   __x__ Stop aspirin on today  __x__ Stop Anti-inflammatories on meloxicam (MOBIC) 15 MG tablet today   ____ Stop supplements until after surgery.    ____ Bring C-Pap to the hospital.

## 2017-08-01 MED ORDER — CEFAZOLIN SODIUM-DEXTROSE 2-4 GM/100ML-% IV SOLN
2.0000 g | INTRAVENOUS | Status: AC
  Administered 2017-08-02: 2 g via INTRAVENOUS

## 2017-08-02 ENCOUNTER — Encounter
Admission: RE | Disposition: A | Discharge status: Home / Self Care | Payer: Self-pay | Source: Ambulatory Visit | Attending: Urology

## 2017-08-02 ENCOUNTER — Observation Stay
Admission: RE | Admit: 2017-08-02 | Discharge: 2017-08-03 | Disposition: A | Discharge status: Home / Self Care | Payer: Medicare HMO | Source: Ambulatory Visit | Attending: Urology | Admitting: Urology

## 2017-08-02 ENCOUNTER — Ambulatory Visit: Payer: Medicare HMO | Admitting: Certified Registered Nurse Anesthetist

## 2017-08-02 ENCOUNTER — Encounter: Payer: Self-pay | Admitting: *Deleted

## 2017-08-02 ENCOUNTER — Other Ambulatory Visit: Payer: Self-pay

## 2017-08-02 DIAGNOSIS — I129 Hypertensive chronic kidney disease with stage 1 through stage 4 chronic kidney disease, or unspecified chronic kidney disease: Secondary | ICD-10-CM | POA: Diagnosis not present

## 2017-08-02 DIAGNOSIS — R338 Other retention of urine: Secondary | ICD-10-CM | POA: Diagnosis not present

## 2017-08-02 DIAGNOSIS — N475 Adhesions of prepuce and glans penis: Secondary | ICD-10-CM | POA: Diagnosis not present

## 2017-08-02 DIAGNOSIS — N401 Enlarged prostate with lower urinary tract symptoms: Principal | ICD-10-CM | POA: Insufficient documentation

## 2017-08-02 DIAGNOSIS — N4889 Other specified disorders of penis: Secondary | ICD-10-CM | POA: Diagnosis not present

## 2017-08-02 DIAGNOSIS — Z888 Allergy status to other drugs, medicaments and biological substances status: Secondary | ICD-10-CM | POA: Insufficient documentation

## 2017-08-02 DIAGNOSIS — Z79899 Other long term (current) drug therapy: Secondary | ICD-10-CM | POA: Insufficient documentation

## 2017-08-02 DIAGNOSIS — N320 Bladder-neck obstruction: Secondary | ICD-10-CM | POA: Diagnosis not present

## 2017-08-02 DIAGNOSIS — N139 Obstructive and reflux uropathy, unspecified: Secondary | ICD-10-CM | POA: Diagnosis not present

## 2017-08-02 DIAGNOSIS — N3289 Other specified disorders of bladder: Secondary | ICD-10-CM | POA: Insufficient documentation

## 2017-08-02 DIAGNOSIS — E1122 Type 2 diabetes mellitus with diabetic chronic kidney disease: Secondary | ICD-10-CM | POA: Insufficient documentation

## 2017-08-02 DIAGNOSIS — K219 Gastro-esophageal reflux disease without esophagitis: Secondary | ICD-10-CM | POA: Diagnosis not present

## 2017-08-02 DIAGNOSIS — E781 Pure hyperglyceridemia: Secondary | ICD-10-CM | POA: Insufficient documentation

## 2017-08-02 DIAGNOSIS — Z7982 Long term (current) use of aspirin: Secondary | ICD-10-CM | POA: Diagnosis not present

## 2017-08-02 DIAGNOSIS — Z794 Long term (current) use of insulin: Secondary | ICD-10-CM | POA: Diagnosis not present

## 2017-08-02 DIAGNOSIS — E119 Type 2 diabetes mellitus without complications: Secondary | ICD-10-CM | POA: Diagnosis not present

## 2017-08-02 DIAGNOSIS — N35919 Unspecified urethral stricture, male, unspecified site: Secondary | ICD-10-CM | POA: Insufficient documentation

## 2017-08-02 DIAGNOSIS — N138 Other obstructive and reflux uropathy: Secondary | ICD-10-CM | POA: Insufficient documentation

## 2017-08-02 DIAGNOSIS — E785 Hyperlipidemia, unspecified: Secondary | ICD-10-CM | POA: Diagnosis not present

## 2017-08-02 DIAGNOSIS — I10 Essential (primary) hypertension: Secondary | ICD-10-CM | POA: Diagnosis not present

## 2017-08-02 DIAGNOSIS — N182 Chronic kidney disease, stage 2 (mild): Secondary | ICD-10-CM | POA: Diagnosis not present

## 2017-08-02 DIAGNOSIS — C61 Malignant neoplasm of prostate: Secondary | ICD-10-CM | POA: Diagnosis not present

## 2017-08-02 HISTORY — PX: TRANSURETHRAL RESECTION OF PROSTATE: SHX73

## 2017-08-02 LAB — GLUCOSE, CAPILLARY
GLUCOSE-CAPILLARY: 224 mg/dL — AB (ref 65–99)
Glucose-Capillary: 259 mg/dL — ABNORMAL HIGH (ref 65–99)

## 2017-08-02 SURGERY — TURP (TRANSURETHRAL RESECTION OF PROSTATE)
Anesthesia: General | Site: Prostate | Wound class: Clean Contaminated

## 2017-08-02 MED ORDER — ACETAMINOPHEN 325 MG PO TABS: 650.0000 mg | ORAL_TABLET | ORAL | Status: DC | PRN

## 2017-08-02 MED ORDER — LISINOPRIL 20 MG PO TABS
20.0000 mg | ORAL_TABLET | Freq: Every day | ORAL | Status: DC
  Administered 2017-08-02 – 2017-08-03 (×2): 20 mg via ORAL
  Filled 2017-08-02 (×2): qty 1

## 2017-08-02 MED ORDER — MORPHINE SULFATE (PF) 2 MG/ML IV SOLN: 2.0000 mg | INTRAVENOUS | Status: DC | PRN

## 2017-08-02 MED ORDER — PANTOPRAZOLE SODIUM 40 MG PO TBEC
40.0000 mg | DELAYED_RELEASE_TABLET | Freq: Every day | ORAL | Status: DC
  Administered 2017-08-03: 40 mg via ORAL
  Filled 2017-08-02: qty 1

## 2017-08-02 MED ORDER — BELLADONNA ALKALOIDS-OPIUM 16.2-60 MG RE SUPP: 1.0000 | Freq: Four times a day (QID) | RECTAL | Status: DC | PRN

## 2017-08-02 MED ORDER — GLIPIZIDE 10 MG PO TABS
10.0000 mg | ORAL_TABLET | Freq: Two times a day (BID) | ORAL | Status: DC
  Administered 2017-08-02 – 2017-08-03 (×2): 10 mg via ORAL
  Filled 2017-08-02 (×3): qty 1

## 2017-08-02 MED ORDER — PROPOFOL 10 MG/ML IV BOLUS
INTRAVENOUS | Status: AC
  Filled 2017-08-02: qty 20

## 2017-08-02 MED ORDER — BACITRACIN ZINC 500 UNIT/GM EX OINT
TOPICAL_OINTMENT | CUTANEOUS | Status: AC
  Filled 2017-08-02: qty 28.35

## 2017-08-02 MED ORDER — ONDANSETRON HCL 4 MG/2ML IJ SOLN
INTRAMUSCULAR | Status: DC | PRN
  Administered 2017-08-02: 4 mg via INTRAVENOUS

## 2017-08-02 MED ORDER — HEPARIN SODIUM (PORCINE) 5000 UNIT/ML IJ SOLN
5000.0000 [IU] | Freq: Three times a day (TID) | INTRAMUSCULAR | Status: DC
  Administered 2017-08-02 – 2017-08-03 (×3): 5000 [IU] via SUBCUTANEOUS
  Filled 2017-08-02 (×3): qty 1

## 2017-08-02 MED ORDER — FLUTICASONE PROPIONATE 50 MCG/ACT NA SUSP
2.0000 | Freq: Every day | NASAL | Status: DC | PRN
  Filled 2017-08-02: qty 16

## 2017-08-02 MED ORDER — FENTANYL CITRATE (PF) 100 MCG/2ML IJ SOLN
INTRAMUSCULAR | Status: DC | PRN
  Administered 2017-08-02 (×2): 25 ug via INTRAVENOUS
  Administered 2017-08-02 (×2): 50 ug via INTRAVENOUS
  Administered 2017-08-02 (×2): 25 ug via INTRAVENOUS

## 2017-08-02 MED ORDER — DOCUSATE SODIUM 100 MG PO CAPS
100.0000 mg | ORAL_CAPSULE | Freq: Two times a day (BID) | ORAL | Status: DC
Start: 2017-08-02 — End: 2017-08-03
  Administered 2017-08-02 – 2017-08-03 (×3): 100 mg via ORAL
  Filled 2017-08-02 (×3): qty 1

## 2017-08-02 MED ORDER — OXYBUTYNIN CHLORIDE 5 MG PO TABS: 5.0000 mg | ORAL_TABLET | Freq: Three times a day (TID) | ORAL | Status: DC | PRN

## 2017-08-02 MED ORDER — OXYCODONE-ACETAMINOPHEN 5-325 MG PO TABS: 1.0000 | ORAL_TABLET | ORAL | Status: DC | PRN

## 2017-08-02 MED ORDER — ONDANSETRON HCL 4 MG/2ML IJ SOLN: 4.0000 mg | INTRAMUSCULAR | Status: DC | PRN

## 2017-08-02 MED ORDER — PROPOFOL 10 MG/ML IV BOLUS
INTRAVENOUS | Status: DC | PRN
  Administered 2017-08-02: 150 mg via INTRAVENOUS
  Administered 2017-08-02: 40 mg via INTRAVENOUS

## 2017-08-02 MED ORDER — SODIUM CHLORIDE 0.9 % IV SOLN
INTRAVENOUS | Status: DC
Start: 2017-08-02 — End: 2017-08-03
  Administered 2017-08-02 – 2017-08-03 (×3): via INTRAVENOUS

## 2017-08-02 MED ORDER — LIDOCAINE HCL (CARDIAC) 20 MG/ML IV SOLN
INTRAVENOUS | Status: DC | PRN
  Administered 2017-08-02: 100 mg via INTRAVENOUS

## 2017-08-02 MED ORDER — LACTATED RINGERS IV SOLN
INTRAVENOUS | Status: DC | PRN
  Administered 2017-08-02: 08:00:00 via INTRAVENOUS

## 2017-08-02 MED ORDER — DIPHENHYDRAMINE HCL 50 MG/ML IJ SOLN: 12.5000 mg | Freq: Four times a day (QID) | INTRAMUSCULAR | Status: DC | PRN

## 2017-08-02 MED ORDER — METFORMIN HCL 500 MG PO TABS
500.0000 mg | ORAL_TABLET | Freq: Two times a day (BID) | ORAL | Status: DC
  Administered 2017-08-02 – 2017-08-03 (×2): 500 mg via ORAL
  Filled 2017-08-02 (×2): qty 1

## 2017-08-02 MED ORDER — PHENYLEPHRINE HCL 10 MG/ML IJ SOLN
INTRAMUSCULAR | Status: DC | PRN
  Administered 2017-08-02: 100 ug via INTRAVENOUS
  Administered 2017-08-02 (×3): 200 ug via INTRAVENOUS
  Administered 2017-08-02 (×3): 100 ug via INTRAVENOUS
  Administered 2017-08-02 (×2): 200 ug via INTRAVENOUS
  Administered 2017-08-02: 100 ug via INTRAVENOUS

## 2017-08-02 MED ORDER — CEFAZOLIN SODIUM-DEXTROSE 1-4 GM/50ML-% IV SOLN
1.0000 g | Freq: Three times a day (TID) | INTRAVENOUS | Status: AC
  Administered 2017-08-02 – 2017-08-03 (×2): 1 g via INTRAVENOUS
  Filled 2017-08-02 (×2): qty 50

## 2017-08-02 MED ORDER — FENTANYL CITRATE (PF) 100 MCG/2ML IJ SOLN
INTRAMUSCULAR | Status: AC
  Filled 2017-08-02: qty 2

## 2017-08-02 MED ORDER — TAMSULOSIN HCL 0.4 MG PO CAPS
0.4000 mg | ORAL_CAPSULE | Freq: Every day | ORAL | Status: DC
  Administered 2017-08-03: 0.4 mg via ORAL
  Filled 2017-08-02: qty 1

## 2017-08-02 MED ORDER — ATORVASTATIN CALCIUM 20 MG PO TABS
40.0000 mg | ORAL_TABLET | Freq: Every day | ORAL | Status: DC
  Administered 2017-08-02: 40 mg via ORAL
  Filled 2017-08-02: qty 2

## 2017-08-02 MED ORDER — FENTANYL CITRATE (PF) 100 MCG/2ML IJ SOLN
INTRAMUSCULAR | Status: AC
  Administered 2017-08-02: 25 ug via INTRAVENOUS
  Filled 2017-08-02: qty 2

## 2017-08-02 MED ORDER — FENTANYL CITRATE (PF) 100 MCG/2ML IJ SOLN
25.0000 ug | INTRAMUSCULAR | Status: DC | PRN
  Administered 2017-08-02 (×2): 25 ug via INTRAVENOUS

## 2017-08-02 MED ORDER — DIPHENHYDRAMINE HCL 12.5 MG/5ML PO ELIX
12.5000 mg | ORAL_SOLUTION | Freq: Four times a day (QID) | ORAL | Status: DC | PRN
  Filled 2017-08-02: qty 5

## 2017-08-02 MED ORDER — ONDANSETRON HCL 4 MG/2ML IJ SOLN: 4.0000 mg | Freq: Once | INTRAMUSCULAR | Status: DC | PRN

## 2017-08-02 MED ORDER — INSULIN GLARGINE 100 UNIT/ML ~~LOC~~ SOLN
35.0000 [IU] | Freq: Every evening | SUBCUTANEOUS | Status: DC
  Administered 2017-08-02: 35 [IU] via SUBCUTANEOUS
  Filled 2017-08-02 (×2): qty 0.35

## 2017-08-02 MED ORDER — CEFAZOLIN SODIUM-DEXTROSE 2-4 GM/100ML-% IV SOLN
INTRAVENOUS | Status: AC
  Filled 2017-08-02: qty 100

## 2017-08-02 MED ORDER — FINASTERIDE 5 MG PO TABS
5.0000 mg | ORAL_TABLET | Freq: Every day | ORAL | Status: DC
  Administered 2017-08-03: 5 mg via ORAL
  Filled 2017-08-02: qty 1

## 2017-08-02 SURGICAL SUPPLY — 24 items
ADAPTER IRRIG TUBE 2 SPIKE SOL (ADAPTER) ×6 IMPLANT
BAG DRAIN CYSTO-URO LG1000N (MISCELLANEOUS) ×3 IMPLANT
BAG URO DRAIN 4000ML (MISCELLANEOUS) ×3 IMPLANT
CATH FOL 2WAY LX 24X30 (CATHETERS) IMPLANT
CATH FOLEY 3WAY 30CC 22FR (CATHETERS) ×3 IMPLANT
DRAPE UTILITY 15X26 TOWEL STRL (DRAPES) ×3 IMPLANT
DRSG TELFA 4X3 1S NADH ST (GAUZE/BANDAGES/DRESSINGS) ×3 IMPLANT
ELECT LOOP 22F BIPOLAR SML (ELECTROSURGICAL)
ELECTRODE LOOP 22F BIPOLAR SML (ELECTROSURGICAL) IMPLANT
GLOVE BIO SURGEON STRL SZ 6.5 (GLOVE) ×2 IMPLANT
GLOVE BIO SURGEONS STRL SZ 6.5 (GLOVE) ×1
GOWN STRL REUS W/ TWL LRG LVL3 (GOWN DISPOSABLE) ×2 IMPLANT
GOWN STRL REUS W/TWL LRG LVL3 (GOWN DISPOSABLE) ×4
HOLDER FOLEY CATH W/STRAP (MISCELLANEOUS) ×6 IMPLANT
KIT TURNOVER CYSTO (KITS) ×3 IMPLANT
LOOP CUT BIPOLAR 24F LRG (ELECTROSURGICAL) ×3 IMPLANT
PACK CYSTO AR (MISCELLANEOUS) ×3 IMPLANT
SET IRRIG Y TYPE TUR BLADDER L (SET/KITS/TRAYS/PACK) ×3 IMPLANT
SET IRRIGATING DISP (SET/KITS/TRAYS/PACK) ×3 IMPLANT
SOL .9 NS 3000ML IRR  AL (IV SOLUTION) ×20
SOL .9 NS 3000ML IRR UROMATIC (IV SOLUTION) ×10 IMPLANT
SYR TOOMEY 50ML (SYRINGE) ×3 IMPLANT
SYRINGE IRR TOOMEY STRL 70CC (SYRINGE) ×3 IMPLANT
WATER STERILE IRR 1000ML POUR (IV SOLUTION) ×3 IMPLANT

## 2017-08-02 NOTE — Interval H&P Note (Signed)
History and Physical Interval Note:  08/02/2017 7:25 AM  Scott Lawson.  has presented today for surgery, with the diagnosis of BPH with bladder outlet obstruction  The various methods of treatment have been discussed with the patient and family. After consideration of risks, benefits and other options for treatment, the patient has consented to  Procedure(s): TRANSURETHRAL RESECTION OF THE PROSTATE (TURP) (N/A) as a surgical intervention .  The patient's history has been reviewed, patient examined, no change in status, stable for surgery.  I have reviewed the patient's chart and labs.  Questions were answered to the patient's satisfaction.    RRR CTAB  Hollice Espy

## 2017-08-02 NOTE — Transfer of Care (Signed)
Immediate Anesthesia Transfer of Care Note  Patient: Scott Lawson.  Procedure(s) Performed: TRANSURETHRAL RESECTION OF THE PROSTATE (TURP) (N/A Prostate)  Patient Location: PACU  Anesthesia Type:General  Level of Consciousness: awake, alert  and oriented  Airway & Oxygen Therapy: Patient Spontanous Breathing  Post-op Assessment: Report given to RN and Post -op Vital signs reviewed and stable  Post vital signs: stable  Last Vitals:  Vitals:   08/02/17 0610 08/02/17 0927  BP: 119/60 101/65  Pulse: 76 77  Resp: 16 17  Temp: 37.2 C (!) 36.3 C  SpO2: 99% 97%    Last Pain:  Vitals:   08/02/17 0927  TempSrc:   PainSc: Asleep         Complications: No apparent anesthesia complications

## 2017-08-02 NOTE — Anesthesia Post-op Follow-up Note (Signed)
Anesthesia QCDR form completed.        

## 2017-08-02 NOTE — Anesthesia Preprocedure Evaluation (Signed)
Anesthesia Evaluation  Patient identified by MRN, date of birth, ID band Patient awake    Reviewed: Allergy & Precautions, NPO status , Patient's Chart, lab work & pertinent test results  History of Anesthesia Complications Negative for: history of anesthetic complications  Airway Mallampati: III       Dental   Pulmonary neg sleep apnea, neg COPD,           Cardiovascular hypertension, Pt. on medications (-) Past MI and (-) CHF (-) dysrhythmias (-) Valvular Problems/Murmurs     Neuro/Psych neg Seizures    GI/Hepatic Neg liver ROS, GERD  Medicated and Controlled,  Endo/Other  diabetes, Type 2, Oral Hypoglycemic Agents, Insulin Dependent  Renal/GU Renal InsufficiencyRenal disease     Musculoskeletal   Abdominal   Peds  Hematology   Anesthesia Other Findings   Reproductive/Obstetrics                             Anesthesia Physical Anesthesia Plan  ASA: III  Anesthesia Plan: General   Post-op Pain Management:    Induction: Intravenous  PONV Risk Score and Plan: 2 and Ondansetron and Midazolam  Airway Management Planned: LMA and Oral ETT  Additional Equipment:   Intra-op Plan:   Post-operative Plan:   Informed Consent: I have reviewed the patients History and Physical, chart, labs and discussed the procedure including the risks, benefits and alternatives for the proposed anesthesia with the patient or authorized representative who has indicated his/her understanding and acceptance.     Plan Discussed with:   Anesthesia Plan Comments:         Anesthesia Quick Evaluation

## 2017-08-02 NOTE — Op Note (Signed)
Date of procedure: 08/02/17  Preoperative diagnosis:  1. BPH with bladder outlet obstruction 2. Urinary retention  Postoperative diagnosis:  1. Same as above 2. Penile adhesions with meatal stenosis  Procedure: 1. TURP 2. Dilation of urethral meatus 3. Lysis of penile adhesions  Surgeon: Hollice Espy, MD  Anesthesia: General  Complications: None  Intraoperative findings: Relatively narrow urethral meatus recurring serial dilation in order to accommodate scope.  Significant penile adhesions taken down bluntly.  BPH with trilobar coaptation.  Trabeculated bladder appreciated.  EBL: Minimal  Specimens: Prostate chips  Drains: 67 French three-way Foley catheter with 30 cc balloon  Indication: Scott Lawson. is a 77 y.o. patient with BPH with bladder outlet obstruction, urinary retention on maximal medical management.  After reviewing the management options for treatment, he elected to proceed with the above surgical procedure(s). We have discussed the potential benefits and risks of the procedure, side effects of the proposed treatment, the likelihood of the patient achieving the goals of the procedure, and any potential problems that might occur during the procedure or recuperation. Informed consent has been obtained.  Description of procedure:  The patient was taken to the operating room and general anesthesia was induced.  The patient was placed in the dorsal lithotomy position, prepped and draped in the usual sterile fashion, and preoperative antibiotics were administered. A preoperative time-out was performed.   At this point time, the urethral meatus was noted to be significantly narrowed with significant penile adhesions to the coronal margin.  I  Was able to peel back these adhesions bluntly and additionally cleaned this area with Betadine solution.  Serial dilation of the urethral meatus was performed starting 12 [place] all the way up to 30 [place] in order to  accommodate the 26 [place] resectoscope.  The resectoscope was then advanced using a visual obturator into the bladder.  The bladder was carefully inspected which was noted to be moderate to heavily trabeculated.  The trigone was visualized in a good distance from the bladder neck.  The bladder neck was slightly elevated without a discrete median lobe.  There is significant trilobar coaptation.  At this point in time, using continuous saline as the irrigant medium, the bipolar loop was used to make an incision at the 5 o'clock position in the prostate at the bladder neck and taken down just proximal to the verumontanum.  I then addressed the left lateral lobe of the prostate in a pea-sized-sized fashion with care taken to avoid any resection beyond the verumontanum.  Once this lobe was taken down, attention was turned to the right lateral lobe.  A small amount of anterior tissue was also taken down to the level of the capsule.  Hemostasis was fairly reasonable throughout the entire resection.  The prostate chips were pushed into the bladder during the resection and hand irrigated out and passed off the field as bladder chips.  Once a wide channel was created within the prostatic fossa which was widely patent and hemostasis was adequate, the procedure was deemed complete.  A 22 French three-way Foley catheter was then advanced into the bladder and the balloon was filled with 30 cc of sterile water.  Slow drip CBI was initiated.  Patient was then clean and dry, repositioned in supine position, reversed from anesthesia, taken the PACU in stable condition.  Plan: Patient will be admitted overnight for CBI.  We will plan to turn off his irrigant tomorrow morning and hopefully plan for voiding trial later in the  day.  Hollice Espy, M.D.

## 2017-08-02 NOTE — Anesthesia Procedure Notes (Signed)
Procedure Name: LMA Insertion Date/Time: 08/02/2017 8:47 AM Performed by: Willette Alma, CRNA Pre-anesthesia Checklist: Patient identified, Patient being monitored, Timeout performed, Emergency Drugs available and Suction available Patient Re-evaluated:Patient Re-evaluated prior to induction Oxygen Delivery Method: Circle system utilized Preoxygenation: Pre-oxygenation with 100% oxygen Induction Type: IV induction Ventilation: Mask ventilation without difficulty LMA: LMA inserted LMA Size: 5.0 Tube type: Oral Number of attempts: 1 Placement Confirmation: positive ETCO2 and breath sounds checked- equal and bilateral Tube secured with: Tape Dental Injury: Teeth and Oropharynx as per pre-operative assessment

## 2017-08-02 NOTE — Anesthesia Postprocedure Evaluation (Signed)
Anesthesia Post Note  Patient: Scott Lawson.  Procedure(s) Performed: TRANSURETHRAL RESECTION OF THE PROSTATE (TURP) (N/A Prostate)  Patient location during evaluation: PACU Anesthesia Type: General Level of consciousness: awake and alert Pain management: pain level controlled Vital Signs Assessment: post-procedure vital signs reviewed and stable Respiratory status: spontaneous breathing and respiratory function stable Cardiovascular status: stable Anesthetic complications: no     Last Vitals:  Vitals:   08/02/17 0942 08/02/17 1012  BP: (!) 112/59 138/61  Pulse: 73 68  Resp: 13 14  Temp:    SpO2: 100% 98%    Last Pain:  Vitals:   08/02/17 1012  TempSrc:   PainSc: 5                  KEPHART,Charan K

## 2017-08-03 ENCOUNTER — Encounter: Payer: Self-pay | Admitting: Urology

## 2017-08-03 DIAGNOSIS — N138 Other obstructive and reflux uropathy: Secondary | ICD-10-CM | POA: Diagnosis not present

## 2017-08-03 DIAGNOSIS — N4889 Other specified disorders of penis: Secondary | ICD-10-CM | POA: Diagnosis not present

## 2017-08-03 DIAGNOSIS — K219 Gastro-esophageal reflux disease without esophagitis: Secondary | ICD-10-CM | POA: Diagnosis not present

## 2017-08-03 DIAGNOSIS — N401 Enlarged prostate with lower urinary tract symptoms: Secondary | ICD-10-CM | POA: Diagnosis not present

## 2017-08-03 DIAGNOSIS — I129 Hypertensive chronic kidney disease with stage 1 through stage 4 chronic kidney disease, or unspecified chronic kidney disease: Secondary | ICD-10-CM | POA: Diagnosis not present

## 2017-08-03 DIAGNOSIS — R338 Other retention of urine: Secondary | ICD-10-CM | POA: Diagnosis not present

## 2017-08-03 DIAGNOSIS — N35919 Unspecified urethral stricture, male, unspecified site: Secondary | ICD-10-CM | POA: Diagnosis not present

## 2017-08-03 DIAGNOSIS — N320 Bladder-neck obstruction: Secondary | ICD-10-CM | POA: Diagnosis not present

## 2017-08-03 DIAGNOSIS — N3289 Other specified disorders of bladder: Secondary | ICD-10-CM | POA: Diagnosis not present

## 2017-08-03 LAB — CBC
HCT: 35.1 % — ABNORMAL LOW (ref 40.0–52.0)
HEMOGLOBIN: 12 g/dL — AB (ref 13.0–18.0)
MCH: 30.7 pg (ref 26.0–34.0)
MCHC: 34.1 g/dL (ref 32.0–36.0)
MCV: 90.2 fL (ref 80.0–100.0)
Platelets: 173 10*3/uL (ref 150–440)
RBC: 3.89 MIL/uL — AB (ref 4.40–5.90)
RDW: 13.3 % (ref 11.5–14.5)
WBC: 7.5 10*3/uL (ref 3.8–10.6)

## 2017-08-03 LAB — BASIC METABOLIC PANEL
ANION GAP: 10 (ref 5–15)
BUN: 26 mg/dL — AB (ref 6–20)
CO2: 23 mmol/L (ref 22–32)
Calcium: 8.3 mg/dL — ABNORMAL LOW (ref 8.9–10.3)
Chloride: 106 mmol/L (ref 101–111)
Creatinine, Ser: 1.29 mg/dL — ABNORMAL HIGH (ref 0.61–1.24)
GFR calc Af Amer: >60 mL/min (ref >60)
GFR calc non Af Amer: 52 mL/min — ABNORMAL LOW (ref >60)
Glucose, Bld: 215 mg/dL — ABNORMAL HIGH (ref 65–99)
POTASSIUM: 3.9 mmol/L (ref 3.5–5.1)
SODIUM: 139 mmol/L (ref 135–145)

## 2017-08-03 MED ORDER — IBUPROFEN 600 MG PO TABS
600.0000 mg | ORAL_TABLET | Freq: Once | ORAL | Status: DC
  Filled 2017-08-03: qty 1

## 2017-08-03 MED ORDER — HYDROCODONE-ACETAMINOPHEN 5-325 MG PO TABS: 1.0000 | ORAL_TABLET | Freq: Four times a day (QID) | ORAL | 0 refills | Status: DC | PRN

## 2017-08-03 MED ORDER — DOCUSATE SODIUM 100 MG PO CAPS: 100.0000 mg | ORAL_CAPSULE | Freq: Two times a day (BID) | ORAL | 0 refills | Status: DC

## 2017-08-03 NOTE — Care Management Obs Status (Signed)
[facility] NOTIFICATION   Patient Details  Name: Scott Lawson. MRN: [id number] Date of Birth: [birth year]   Medicare Observation Status Notification Given:  Yes    Beverly Sessions, RN 08/03/2017, 3:47 PM

## 2017-08-03 NOTE — Progress Notes (Signed)
08/03/2017 5:48 PM  Scott Bruins Manzer Jr. to be D/C'd Home per MD order.  Discussed prescriptions and follow up appointments with the patient. Prescriptions given to patient, medication list explained in detail. Pt verbalized understanding.  Allergies as of 08/03/2017      Reactions   Gabapentin Other (See Comments)   "made me fall out"      Medication List    TAKE these medications   ACCU-CHEK FASTCLIX LANCETS Misc Use to check blood sugar twice daily   ACCU-CHEK NANO SMARTVIEW w/Device Kit   aspirin 81 MG tablet Take 81 mg by mouth daily.   atorvastatin 40 MG tablet Commonly known as:  LIPITOR Take 1 tablet (40 mg total) by mouth daily.   B-D SINGLE USE SWABS REGULAR Pads   docusate sodium 100 MG capsule Commonly known as:  COLACE Take 1 capsule (100 mg total) by mouth 2 (two) times daily.   finasteride 5 MG tablet Commonly known as:  PROSCAR TAKE 1 TABLET EVERY DAY   fluticasone 50 MCG/ACT nasal spray Commonly known as:  FLONASE Place 2 sprays into both nostrils daily as needed.   glipiZIDE 10 MG tablet Commonly known as:  GLUCOTROL Take 1 tablet (10 mg total) by mouth 2 (two) times daily before a meal.   glucose blood test strip 1 each by Other route as needed for other. Use as instructed   HYDROcodone-acetaminophen 5-325 MG tablet Commonly known as:  NORCO/VICODIN Take 1-2 tablets by mouth every 6 (six) hours as needed for moderate pain.   insulin glargine 100 UNIT/ML injection Commonly known as:  LANTUS Inject 35 Units into the skin every evening. 1800   lisinopril 20 MG tablet Commonly known as:  PRINIVIL,ZESTRIL Take 1 tablet (20 mg total) by mouth daily.   meloxicam 15 MG tablet Commonly known as:  MOBIC Take 15 mg by mouth daily.   metFORMIN 500 MG tablet Commonly known as:  GLUCOPHAGE Take 1 tablet (500 mg total) by mouth 2 (two) times daily with a meal.   omeprazole 20 MG capsule Commonly known as:  PRILOSEC Take 1 capsule (20 mg total)  by mouth daily.   tamsulosin 0.4 MG Caps capsule Commonly known as:  FLOMAX TAKE 1 CAPSULE EVERY DAY       Vitals:   08/03/17 0548 08/03/17 1230  BP: (!) 120/55 (!) 145/70  Pulse: 80 61  Resp: 18 17  Temp: 98.5 F (36.9 C) 98.1 F (36.7 C)  SpO2: 97% 98%    Skin clean, dry and intact without evidence of skin break down, no evidence of skin tears noted. IV catheter discontinued intact. Site without signs and symptoms of complications. Dressing and pressure applied. Pt denies pain at this time. No complaints noted.  An After Visit Summary was printed and given to the patient. D/C home via private auto.  Dola Argyle

## 2017-08-03 NOTE — Discharge Summary (Signed)
Date of admission: 08/02/2017  Date of discharge: 08/03/2017  Admission diagnosis: BPH with urinary obstruction, urinary retention  Discharge diagnosis: Same as above  Secondary diagnoses:  Patient Active Problem List   Diagnosis Date Noted  . BPH with urinary obstruction 08/02/2017  . Advanced care planning/counseling discussion 12/01/2016  . Nocturia 06/02/2016  . Dehydration 08/14/2015  . Elevated troponin 08/14/2015  . GERD (gastroesophageal reflux disease) 08/14/2015  . Uncontrolled type 2 diabetes mellitus with chronic kidney disease ([place]) 08/13/2015  . Essential hypertension, benign 08/13/2015  . 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 Hyperlipidemia 02/11/2015  . BPH (benign prostatic hyperplasia) 02/11/2015  . Hypertensive CKD (chronic kidney disease) 02/11/2015    History and Physical: For full details, please see admission history and physical. Briefly, Scott Roof. is a 77 y.o. year old patient with urinary retention due to BPH who elected to undergo TURP.  Procedure was uncomplicated and he was admitted for observation for CBI following the procedure.Marland Kitchen   Hospital Course: Patient tolerated the procedure well.  He was then transferred to the floor after an uneventful PACU stay.  His hospital course was uncomplicated.  CBI was continued overnight and held the following morning.  He ultimately underwent a successful voiding trial.  On POD#he had met discharge criteria: was eating a regular diet, was up and ambulating independently,  pain was well controlled, was voiding without a catheter, and was ready to for discharge.  Physical Exam  Constitutional: He is oriented to person, place, and time. He appears well-developed and well-nourished.  HENT:  Head: Normocephalic and atraumatic.  Pulmonary/Chest: Effort normal. No respiratory distress.  Abdominal: Soft. He exhibits no distension. There is no tenderness.  Genitourinary: Penis normal.  Genitourinary  Comments: Foley draining clear yellow urine  Musculoskeletal: Normal range of motion. He exhibits no edema.  Neurological: He is alert and oriented to person, place, and time.  Skin: Skin is warm.  Psychiatric: He has a normal mood and affect. His behavior is normal.    Laboratory values:  Recent Labs    08/03/17 0340  WBC 7.5  HGB 12.0*  HCT 35.1*   Recent Labs    08/03/17 0340  NA 139  K 3.9  CL 106  CO2 23  GLUCOSE 215*  BUN 26*  CREATININE 1.29*  CALCIUM 8.3*   No results for input(s): LABPT, INR in the last 72 hours. No results for input(s): LABURIN in the last 72 hours. Results for orders placed or performed in visit on 07/22/17  Microscopic Examination     Status: Abnormal   Collection Time: 07/22/17  9:12 AM  Result Value Ref Range Status   WBC, UA 0-5 0 - 5 /hpf Final   RBC, UA 3-10 (A) 0 - 2 /hpf Final   Epithelial Cells (non renal) None seen 0 - 10 /hpf Final   Bacteria, UA Few (A) None seen/Few Final  CULTURE, URINE COMPREHENSIVE     Status: Abnormal   Collection Time: 07/22/17 10:13 AM  Result Value Ref Range Status   Urine Culture, Comprehensive Final report (A)  Final   Organism ID, Bacteria Enterococcus faecalis (A)  Final    Comment: 50,000-100,000 colony forming units per mL Note: this isolate is vancomycin-susceptible. This information is provided for epidemiologic purposes only: vancomycin is not among the antibiotics recommended for therapy of urinary tract infections caused by Enterococcus.    ANTIMICROBIAL SUSCEPTIBILITY Comment  Final    Comment:       **  S = Susceptible; I = Intermediate; R = Resistant **                    P = Positive; N = Negative             MICS are expressed in micrograms per mL    Antibiotic                 RSLT#1    RSLT#2    RSLT#3    RSLT#4 Ciprofloxacin                  S Levofloxacin                   S Nitrofurantoin                 S Penicillin                     S Tetracycline                    R Vancomycin                     S     Disposition: Home  Discharge instruction: see TURP instructions  Discharge medications:  Allergies as of 08/03/2017      Reactions   Gabapentin Other (See Comments)   "made me fall out"      Medication List    TAKE these medications   ACCU-CHEK FASTCLIX LANCETS Misc Use to check blood sugar twice daily   ACCU-CHEK NANO SMARTVIEW w/Device Kit   aspirin 81 MG tablet Take 81 mg by mouth daily.   atorvastatin 40 MG tablet Commonly known as:  LIPITOR Take 1 tablet (40 mg total) by mouth daily.   B-D SINGLE USE SWABS REGULAR Pads   docusate sodium 100 MG capsule Commonly known as:  COLACE Take 1 capsule (100 mg total) by mouth 2 (two) times daily.   finasteride 5 MG tablet Commonly known as:  PROSCAR TAKE 1 TABLET EVERY DAY   fluticasone 50 MCG/ACT nasal spray Commonly known as:  FLONASE Place 2 sprays into both nostrils daily as needed.   glipiZIDE 10 MG tablet Commonly known as:  GLUCOTROL Take 1 tablet (10 mg total) by mouth 2 (two) times daily before a meal.   glucose blood test strip 1 each by Other route as needed for other. Use as instructed   HYDROcodone-acetaminophen 5-325 MG tablet Commonly known as:  NORCO/VICODIN Take 1-2 tablets by mouth every 6 (six) hours as needed for moderate pain.   insulin glargine 100 UNIT/ML injection Commonly known as:  LANTUS Inject 35 Units into the skin every evening. 1800   lisinopril 20 MG tablet Commonly known as:  PRINIVIL,ZESTRIL Take 1 tablet (20 mg total) by mouth daily.   meloxicam 15 MG tablet Commonly known as:  MOBIC Take 15 mg by mouth daily.   metFORMIN 500 MG tablet Commonly known as:  GLUCOPHAGE Take 1 tablet (500 mg total) by mouth 2 (two) times daily with a meal.   omeprazole 20 MG capsule Commonly known as:  PRILOSEC Take 1 capsule (20 mg total) by mouth daily.   tamsulosin 0.4 MG Caps capsule Commonly known as:  FLOMAX TAKE 1 CAPSULE EVERY DAY         Followup:  Follow-up Information    Hollice Espy, MD In 1 month.   Specialty:  Urology Why:  as  scheduled. Contact information: [place] [address] [phone]

## 2017-08-03 NOTE — Discharge Instructions (Signed)
Transurethral Resection of the Prostate, Care After °Refer to this sheet in the next few weeks. These instructions provide you with information about caring for yourself after your procedure. Your health care provider may also give you more specific instructions. Your treatment has been planned according to current medical practices, but problems sometimes occur. Call your health care provider if you have any problems or questions after your procedure. °What can I expect after the procedure? °After the procedure, it is common to have: °· Mild pain in your lower abdomen. °· Soreness or mild discomfort in your penis from having the catheter inserted during the procedure. °· A feeling of urgency when you need to urinate. °· A small amount of blood in your urine. You may notice some small blood clots in your urine. These are normal. ° °Follow these instructions at home: °Medicines ° °· Take over-the-counter and prescription medicines only as told by your health care provider. °· Do not drive or operate heavy machinery while taking prescription pain medicine. °· Do not drive for 24 hours if you received a sedative. °· If you were prescribed antibiotic medicine, take it as told by your health care provider. Do not stop taking the antibiotic even if you start to feel better. °Activity °· Return to your normal activities as told by your health care provider. Ask your health care provider what activities are safe for you. °· Do not lift anything that is heavier than 10 lb (4.5 kg) for 3 weeks after your procedure, or as long as told by your health care provider. °· Avoid intense physical activity for as long as told by your health care provider. °· Walk at least one time every day. This helps to prevent blood clots. You may increase your physical activity gradually as you start to feel better. °Lifestyle °· Do not drink alcohol for as long as told by your health care provider. This is especially important if you are taking  prescription pain medicines. °· Do not engage in sexual activity until your health care provider says that you can do this. °General instructions °· Do not take baths, swim, or use a hot tub until your health care provider approves. °· Drink enough fluid to keep your urine clear or pale yellow. °· Urinate as soon as you feel the need to. Do not try to hold your urine for long periods of time. °· If your health care provider approves, you may take a stool softener for 2-3 weeks to prevent you from straining to have a bowel movement. °· Wear compression stockings as told by your health care provider. These stockings help to prevent blood clots and reduce swelling in your legs. °· Keep all follow-up visits as told by your health care provider. This is important. °Contact a health care provider if: °· You have difficulty urinating. °· You have a fever. °· You have pain that gets worse or does not improve with medicine. °· You have blood in your urine that does not go away after 1 week of resting and drinking more fluids. °· You have swelling in your penis or testicles. °Get help right away if: °· You are unable to urinate. °· You are having more blood clots in your urine instead of fewer. °· You have: °? Large blood clots. °? A lot of blood in your urine. °? Pain in your back or lower abdomen. °? Pain or swelling in your legs. °? Chills and you are shaking. °This information is not intended to   replace advice given to you by your health care provider. Make sure you discuss any questions you have with your health care provider. °Document Released: 06/01/2005 Document Revised: 02/02/2016 Document Reviewed: 02/21/2015 °Elsevier Interactive Patient Education © 2017 [employer] ° °

## 2017-08-05 ENCOUNTER — Telehealth: Payer: Self-pay

## 2017-08-05 NOTE — Telephone Encounter (Signed)
Pt has urinary retention that requires a coude tip due inability to pass a straight catheter.  Pt has to cath indefinitely.

## 2017-08-06 LAB — SURGICAL PATHOLOGY

## 2017-08-10 ENCOUNTER — Ambulatory Visit (INDEPENDENT_AMBULATORY_CARE_PROVIDER_SITE_OTHER): Payer: Medicare HMO

## 2017-08-10 VITALS — BP 132/68 | HR 61 | Ht 69.0 in | Wt 240.0 lb

## 2017-08-10 DIAGNOSIS — R338 Other retention of urine: Secondary | ICD-10-CM | POA: Diagnosis not present

## 2017-08-10 DIAGNOSIS — N401 Enlarged prostate with lower urinary tract symptoms: Secondary | ICD-10-CM

## 2017-08-10 NOTE — Progress Notes (Signed)
Catheter Removal  Patient is present today for a catheter removal.  45ml of water was drained from the balloon. A 18FR foley cath was removed from the bladder no complications were noted . Patient tolerated well.  Preformed by: Hyman Hopes   Follow up/ Additional notes: Reinforced with pt if not able to urinate to either RTC or perform CIC. Pt voiced understanding of whole conversation.   Blood pressure 132/68, pulse 61, height 5\' 9"  (1.753 m), weight 240 lb (108.9 kg).

## 2017-08-16 ENCOUNTER — Ambulatory Visit (INDEPENDENT_AMBULATORY_CARE_PROVIDER_SITE_OTHER): Payer: Medicare HMO | Admitting: Unknown Physician Specialty

## 2017-08-16 ENCOUNTER — Encounter: Payer: Self-pay | Admitting: Unknown Physician Specialty

## 2017-08-16 DIAGNOSIS — L409 Psoriasis, unspecified: Secondary | ICD-10-CM

## 2017-08-16 MED ORDER — BETAMETHASONE DIPROPIONATE AUG 0.05 % EX CREA: TOPICAL_CREAM | Freq: Two times a day (BID) | CUTANEOUS | 0 refills | Status: DC

## 2017-08-16 NOTE — Assessment & Plan Note (Addendum)
New diagnosis.  Only one spot for now.  Rx for Diprolene to twice a day.  No signs of infection or foreign body.  Keep area clean and dry

## 2017-08-16 NOTE — Progress Notes (Signed)
BP 129/71   Pulse 66   Temp 97.7 F (36.5 C) (Oral)   Ht 5\' 9"  (1.753 m)   Wt 240 lb 3.2 oz (109 kg)   SpO2 98%   BMI 35.47 kg/m    Subjective:    Patient ID: Scott Lawson., male    DOB: 1940-09-24, 77 y.o.   MRN: [id number]  HPI: Azir Muzyka. is a 77 y.o. male  Chief Complaint  Patient presents with  . Leg Injury    pt states he has a red spot on his left leg that has been there for about 10 weeks. States it itches.    Pt with a patch left shin.  States it itches.  Worries there is "something in there."   Rash  This is a new problem. Episode onset: 10 months ago. The problem has been waxing and waning since onset. Location: left shin. The rash is characterized by dryness, itchiness and redness. He was exposed to nothing. Pertinent negatives include no anorexia, congestion, cough, diarrhea, eye pain, facial edema, fatigue, fever, joint pain, nail changes, rhinorrhea, shortness of breath, sore throat or vomiting. Past treatments include nothing. The treatment provided no relief. There is no history of allergies or asthma.     Relevant past medical, surgical, family and social history reviewed and updated as indicated. Interim medical history since our last visit reviewed. Allergies and medications reviewed and updated.  Review of Systems  Constitutional: Negative for fatigue and fever.  HENT: Negative for congestion, rhinorrhea and sore throat.   Eyes: Negative for pain.  Respiratory: Negative for cough and shortness of breath.   Gastrointestinal: Negative for anorexia, diarrhea and vomiting.  Musculoskeletal: Negative for joint pain.  Skin: Positive for rash. Negative for nail changes.    Per HPI unless specifically indicated above     Objective:    BP 129/71   Pulse 66   Temp 97.7 F (36.5 C) (Oral)   Ht 5\' 9"  (1.753 m)   Wt 240 lb 3.2 oz (109 kg)   SpO2 98%   BMI 35.47 kg/m   Wt Readings from Last 3 Encounters:  08/16/17 240 lb 3.2 oz (109  kg)  08/10/17 240 lb (108.9 kg)  07/29/17 240 lb (108.9 kg)    Physical Exam  Constitutional: He is oriented to person, place, and time. He appears well-developed and well-nourished. No distress.  HENT:  Head: Normocephalic and atraumatic.  Eyes: Conjunctivae and lids are normal. Right eye exhibits no discharge. Left eye exhibits no discharge. No scleral icterus.  Neck: Normal range of motion. Neck supple. No JVD present. Carotid bruit is not present.  Cardiovascular: Normal rate, regular rhythm and normal heart sounds.  Pulmonary/Chest: Effort normal and breath sounds normal. No respiratory distress.  Abdominal: Normal appearance. There is no splenomegaly or hepatomegaly.  Musculoskeletal: Normal range of motion.  Neurological: He is alert and oriented to person, place, and time.  Skin: Skin is warm, dry and intact.  4-5 cm patch left shin dry and erythemetous.  Irregular borders, no raised edge  Psychiatric: He has a normal mood and affect. His behavior is normal. Judgment and thought content normal.      Assessment & Plan:   Problem List Items Addressed This Visit      Unprioritized   Psoriasis    New diagnosis.  Only one spot for now.  Rx for Diprolene to twice a day.  No signs of infection or foreign body.  Keep area  clean and dry          Follow up plan: Return if symptoms worsen or fail to improve.

## 2017-08-16 NOTE — Patient Instructions (Addendum)
Psoriasis Psoriasis is a long-term (chronic) condition of skin inflammation. It occurs because your immune system causes skin cells to form too quickly. As a result, too many skin cells grow and create raised, red patches (plaques) that look silvery on your skin. Plaques may appear anywhere on your body. They can be any size or shape. Psoriasis can come and go. The condition varies from mild to very severe. It cannot be passed from one person to another (not contagious). What are the causes? The cause of psoriasis is not known, but certain factors can make the condition worse. These include:  Damage or trauma to the skin, such as cuts, scrapes, sunburn, and dryness.  Lack of sunlight.  Certain medicines.  Alcohol.  Tobacco use.  Stress.  Infections caused by bacteria or viruses.  What increases the risk? This condition is more likely to develop in:  People with a family history of psoriasis.  People who are Caucasian.  People who are between the ages of 15-30 and 50-60 years old.  What are the signs or symptoms? There are five different types of psoriasis. You can have more than one type of psoriasis during your life. Types are:  Plaque.  Guttate.  Inverse.  Pustular.  Erythrodermic.  Each type of psoriasis has different symptoms.  Plaque psoriasis symptoms include red, raised plaques with a silvery white coating (scale). These plaques may be itchy. Your nails may be pitted and crumbly or fall off.  Guttate psoriasis symptoms include small red spots that often show up on your trunk, arms, and legs. These spots may develop after you have been sick, especially with strep throat.  Inverse psoriasis symptoms include plaques in your underarm area, under your breasts, or on your genitals, groin, or buttocks.  Pustular psoriasis symptoms include pus-filled bumps that are painful, red, and swollen on the palms of your hands or the soles of your feet. You also may feel  exhausted, feverish, weak, or have no appetite.  Erythrodermic psoriasis symptoms include bright red skin that may look burned. You may have a fast heartbeat and a body temperature that is too high or too low. You may be itchy or in pain.  How is this diagnosed? Your health care provider may suspect psoriasis based on your symptoms and family history. Your health care provider will also do a physical exam. This may include a procedure to remove a tissue sample (biopsy) for testing. You may also be referred to a health care provider who specializes in skin diseases (dermatologist). How is this treated? There is no cure for this condition, but treatment can help manage it. Goals of treatment include:  Helping your skin heal.  Reducing itching and inflammation.  Slowing the growth of new skin cells.  Helping your immune system respond better to your skin.  Treatment varies, depending on the severity of your condition. Treatment may include:  Creams or ointments.  Ultraviolet ray exposure (light therapy). This may include natural sunlight or light therapy in a medical office.  Medicines (systemic therapy). These medicines can help your body better manage skin cell turnover and inflammation. They may be used along with light therapy or ointments. You may also get antibiotic medicines if you have an infection.  Follow these instructions at home: Skin Care  Moisturize your skin as needed. Only use moisturizers that have been approved by your health care provider.  Apply cool compresses to the affected areas.  Do not scratch your skin. Lifestyle   Do not   use tobacco products. This includes cigarettes, chewing tobacco, and e-cigarettes. If you need help quitting, ask your health care provider.  Drink little or no alcohol.  Try techniques for stress reduction, such as meditation or yoga.  Get exposure to the sun as told by your health care provider. Do not get sunburned.  Consider  joining a psoriasis support group. Medicines  Take or use over-the-counter and prescription medicines only as told by your health care provider.  If you were prescribed an antibiotic, take or use it as told by your health care provider. Do not stop taking the antibiotic even if your condition starts to improve. General instructions  Keep a journal to help track what triggers an outbreak. Try to avoid any triggers.  See a counselor or social worker if feelings of sadness, frustration, and hopelessness about your condition are interfering with your work and relationships.  Keep all follow-up visits as told by your health care provider. This is important. Contact a health care provider if:  Your pain gets worse.  You have increasing redness or warmth in the affected areas.  You have new or worsening pain or stiffness in your joints.  Your nails start to break easily or pull away from the nail bed.  You have a fever.  You feel depressed. This information is not intended to replace advice given to you by your health care provider. Make sure you discuss any questions you have with your health care provider. Document Released: 05/29/2000 Document Revised: 11/07/2015 Document Reviewed: 10/17/2014 Elsevier Interactive Patient Education  2018 [employer]  

## 2017-08-17 ENCOUNTER — Telehealth: Payer: Self-pay | Admitting: Radiology

## 2017-08-17 NOTE — Telephone Encounter (Signed)
Advised pt that he needs a bladder scan to make sure he is not in retention. Pt chooses to self cath today & RTC tomorrow for ua, ucx & bladder scan.

## 2017-08-17 NOTE — Telephone Encounter (Signed)
Pt states his urine stream is strong but sprays into 3 streams. Also c/o dysuria & stomach pain. Advised pt to RTC for UCX. Appt made per pt request on 08/18/2017.

## 2017-08-17 NOTE — Telephone Encounter (Signed)
With his abdominal pain, it is probably good idea for him to come in today.  In addition to UA/urine culture, please have the nurse bladder scan him to make sure he is not in retention.  Or he can self cath to be sure he's empty?  Hollice Espy, MD

## 2017-08-18 ENCOUNTER — Telehealth: Payer: Self-pay

## 2017-08-18 ENCOUNTER — Ambulatory Visit (INDEPENDENT_AMBULATORY_CARE_PROVIDER_SITE_OTHER): Payer: Medicare HMO

## 2017-08-18 VITALS — BP 109/70 | HR 99 | Resp 16 | Ht 69.0 in | Wt 240.0 lb

## 2017-08-18 DIAGNOSIS — R339 Retention of urine, unspecified: Secondary | ICD-10-CM | POA: Diagnosis not present

## 2017-08-18 LAB — URINALYSIS, COMPLETE
Bilirubin, UA: NEGATIVE
Glucose, UA: NEGATIVE
Nitrite, UA: POSITIVE — AB
PH UA: 5.5 (ref 5.0–7.5)
Specific Gravity, UA: >1.03 — ABNORMAL HIGH (ref 1.005–1.030)
Urobilinogen, Ur: 1 mg/dL (ref 0.2–1.0)

## 2017-08-18 LAB — BLADDER SCAN AMB NON-IMAGING

## 2017-08-18 LAB — MICROSCOPIC EXAMINATION
EPITHELIAL CELLS (NON RENAL): NONE SEEN /HPF (ref 0–10)
RBC, UA: >30 /hpf — ABNORMAL HIGH (ref 0–?)
WBC, UA: >30 /hpf — ABNORMAL HIGH (ref 0–?)

## 2017-08-18 MED ORDER — AMPICILLIN 500 MG PO CAPS: 500.0000 mg | ORAL_CAPSULE | Freq: Four times a day (QID) | ORAL | 0 refills | Status: DC

## 2017-08-18 NOTE — Telephone Encounter (Signed)
-----   Message from Hollice Espy, MD sent at 08/18/2017  4:36 PM EST ----- UA+, please go ahead and treat with Ampicillin 500 mg po qid x 7 days  Hollice Espy, MD

## 2017-08-18 NOTE — Progress Notes (Signed)
Bladder Scan:54 Patient can void:  Performed By: Toniann Fail, LPN   A clean catch was collected for u/a and cx.   Blood pressure 109/70, pulse 99, resp. rate 16, height 5\' 9"  (1.753 m), weight 240 lb (108.9 kg), SpO2 98 %.

## 2017-08-18 NOTE — Telephone Encounter (Signed)
Spoke with pt and made aware of abx. Pt voiced understanding.

## 2017-08-23 ENCOUNTER — Telehealth: Payer: Self-pay

## 2017-08-23 LAB — CULTURE, URINE COMPREHENSIVE

## 2017-08-23 MED ORDER — LEVOFLOXACIN 500 MG PO TABS: 500.0000 mg | ORAL_TABLET | Freq: Two times a day (BID) | ORAL | 0 refills | Status: DC

## 2017-08-23 NOTE — Telephone Encounter (Signed)
Called and spoke w/ pt, informed of positive culture. Pt instructed to stop current abx, and new abx sent to [facility].   Cristie Hem, CMA

## 2017-08-23 NOTE — Telephone Encounter (Signed)
-----   Message from Hollice Espy, MD sent at 08/23/2017  8:38 AM EDT ----- Please change abx to Levaquin 500 mg daily x 7 days  Hollice Espy, MD

## 2017-08-31 ENCOUNTER — Encounter: Payer: Self-pay | Admitting: Urology

## 2017-08-31 ENCOUNTER — Ambulatory Visit (INDEPENDENT_AMBULATORY_CARE_PROVIDER_SITE_OTHER): Payer: Medicare HMO | Admitting: Urology

## 2017-08-31 VITALS — BP 126/67 | HR 99

## 2017-08-31 DIAGNOSIS — N401 Enlarged prostate with lower urinary tract symptoms: Secondary | ICD-10-CM | POA: Diagnosis not present

## 2017-08-31 DIAGNOSIS — C61 Malignant neoplasm of prostate: Secondary | ICD-10-CM

## 2017-08-31 DIAGNOSIS — N400 Enlarged prostate without lower urinary tract symptoms: Secondary | ICD-10-CM | POA: Diagnosis not present

## 2017-08-31 DIAGNOSIS — N138 Other obstructive and reflux uropathy: Secondary | ICD-10-CM | POA: Diagnosis not present

## 2017-08-31 LAB — MICROSCOPIC EXAMINATION

## 2017-08-31 LAB — URINALYSIS, COMPLETE
Bilirubin, UA: NEGATIVE
KETONES UA: NEGATIVE
NITRITE UA: NEGATIVE
SPEC GRAV UA: 1.025 (ref 1.005–1.030)
UUROB: 0.2 mg/dL (ref 0.2–1.0)
pH, UA: 5 (ref 5.0–7.5)

## 2017-08-31 LAB — BLADDER SCAN AMB NON-IMAGING

## 2017-08-31 NOTE — Progress Notes (Signed)
 08/31/2017 3:50 PM   Scott H Summa Jr. 03/08/1941 [id number]  Referring provider: Wicker, Cheryl, NP [address], [place] [address]  Chief Complaint  Patient presents with  . Benign Prostatic Hypertrophy    1 month post op    HPI: 77-year-old male with a history of BPH with bladder outlet obstruction, urinary retention previously on clean intermittent catheterization who underwent transurethral resection of his prostate on 08/02/2017.    His postoperative course was complicated by urinary tract infection, staph aureus, treated with Levaquin.  He just completed this medicine a few days ago.  He reports that this was not a well-tolerated medication and caused him to feel malaise and back pain.  Once the medicine was completed a really partial  Surgical pathology does show incidental prostate cancer involving 1% of the tissue submitted.  Histology favors Gleason 3+4 but this was limited due to cautery artifact.  There is also evidence of mixed acute and chronic inflammation.  A total of 11 g was resected.  Since surgery, he has been voiding spontaneously.  He did have frequency, urgency, dysuria associated with his infection.  The symptoms are now finally resolving.  PVR 119 cc today.  He has been cathing occasionally at home to check his postvoid residuals.  This is been around 100 cc.  He does have a history of remotely elevated PSA, although his most recent PSA was 1.9 on 05/2016 and further PSA screening has been discontinued.  Preop TRUS vol 45 cc.  Cystoscopy revealed a heavily trabeculated bladder with multiple saccules.  He continues on Flomax and finasteride.  PMH: Past Medical History:  Diagnosis Date  . BPH (benign prostatic hyperplasia)   . Diabetes mellitus without complication (HCC)   . Elevated PSA   . GERD (gastroesophageal reflux disease)   . Hypertension   . Hypertriglyceridemia   . Nocturia     Surgical History: Past Surgical History:  Procedure  Laterality Date  . APPENDECTOMY    . CARPAL TUNNEL RELEASE Left 12/11/2015   Procedure: CARPAL TUNNEL RELEASE;  Surgeon: Howard Miller, MD;  Location: [facility] ORS;  Service: Orthopedics;  Laterality: Left;  . EYE SURGERY     laser  . TRANSURETHRAL RESECTION OF PROSTATE N/A 08/02/2017   Procedure: TRANSURETHRAL RESECTION OF THE PROSTATE (TURP);  Surgeon: , , MD;  Location: [facility] ORS;  Service: Urology;  Laterality: N/A;    Home Medications:  Allergies as of 08/31/2017      Reactions   Gabapentin Other (See Comments)   "made me fall out"      Medication List        Accurate as of 08/31/17  3:50 PM. Always use your most recent med list.          ACCU-CHEK FASTCLIX LANCETS Misc Use to check blood sugar twice daily   ACCU-CHEK NANO SMARTVIEW w/Device Kit   aspirin 81 MG tablet Take 81 mg by mouth daily.   atorvastatin 40 MG tablet Commonly known as:  LIPITOR Take 1 tablet (40 mg total) by mouth daily.   augmented betamethasone dipropionate 0.05 % cream Commonly known as:  DIPROLENE AF Apply topically 2 (two) times daily.   B-D SINGLE USE SWABS REGULAR Pads   docusate sodium 100 MG capsule Commonly known as:  COLACE Take 1 capsule (100 mg total) by mouth 2 (two) times daily.   fluticasone 50 MCG/ACT nasal spray Commonly known as:  FLONASE Place 2 sprays into both nostrils daily as needed.   glipiZIDE 10   MG tablet Commonly known as:  GLUCOTROL Take 1 tablet (10 mg total) by mouth 2 (two) times daily before a meal.   glucose blood test strip 1 each by Other route as needed for other. Use as instructed   insulin glargine 100 UNIT/ML injection Commonly known as:  LANTUS Inject 35 Units into the skin every evening. 1800   lisinopril 20 MG tablet Commonly known as:  PRINIVIL,ZESTRIL Take 1 tablet (20 mg total) by mouth daily.   meloxicam 15 MG tablet Commonly known as:  MOBIC Take 15 mg by mouth daily.   metFORMIN 500 MG tablet Commonly known as:   GLUCOPHAGE Take 1 tablet (500 mg total) by mouth 2 (two) times daily with a meal.   omeprazole 20 MG capsule Commonly known as:  PRILOSEC Take 1 capsule (20 mg total) by mouth daily.   tamsulosin 0.4 MG Caps capsule Commonly known as:  FLOMAX TAKE 1 CAPSULE EVERY DAY       Allergies:  Allergies  Allergen Reactions  . Gabapentin Other (See Comments)    "made me fall out"    Family History: Family History  Problem Relation Age of Onset  . Cancer Mother        lung  . Heart disease Father   . Diabetes Brother   . Prostate cancer Neg Hx   . Bladder Cancer Neg Hx   . Kidney cancer Neg Hx     Social History:  reports that  has never smoked. he has never used smokeless tobacco. He reports that he does not drink alcohol or use drugs.  ROS: UROLOGY Frequent Urination?: No Hard to postpone urination?: No Burning/pain with urination?: Yes Get up at night to urinate?: No Leakage of urine?: No Urine stream starts and stops?: No Trouble starting stream?: No Do you have to strain to urinate?: No Blood in urine?: Yes Urinary tract infection?: Yes Sexually transmitted disease?: No Injury to kidneys or bladder?: No Painful intercourse?: No Weak stream?: No Erection problems?: No Penile pain?: No  Gastrointestinal Nausea?: No Vomiting?: No Indigestion/heartburn?: No Diarrhea?: No Constipation?: No  Constitutional Fever: No Night sweats?: No Weight loss?: No Fatigue?: No  Skin Skin rash/lesions?: No Itching?: No  Eyes Blurred vision?: No Double vision?: No  Ears/Nose/Throat Sore throat?: No Sinus problems?: No  Hematologic/Lymphatic Swollen glands?: No Easy bruising?: No  Cardiovascular Leg swelling?: No Chest pain?: No  Respiratory Cough?: No Shortness of breath?: No  Endocrine Excessive thirst?: No  Musculoskeletal Back pain?: No Joint pain?: No  Neurological Headaches?: No Dizziness?: No  Psychologic Depression?: No Anxiety?:  No  Physical Exam: BP 126/67   Pulse 99   Constitutional:  Alert and oriented, No acute distress. HEENT: [place] AT, moist mucus membranes.  Trachea midline, no masses. Cardiovascular: No clubbing, cyanosis, or edema. Respiratory: Normal respiratory effort, no increased work of breathing. Neurologic: Grossly intact, no focal deficits, moving all 4 extremities. Psychiatric: Normal mood and affect.  Laboratory Data: Lab Results  Component Value Date   WBC 7.5 08/03/2017   HGB 12.0 (L) 08/03/2017   HCT 35.1 (L) 08/03/2017   MCV 90.2 08/03/2017   PLT 173 08/03/2017    Lab Results  Component Value Date   CREATININE 1.29 (H) 08/03/2017     Lab Results  Component Value Date   HGBA1C 7.1% 03/03/2016    Urinalysis Results for orders placed or performed in visit on 08/31/17  Microscopic Examination  Result Value Ref Range   WBC, UA >30 (H) 0 -   5 /hpf   RBC, UA 11-30 (A) 0 - 2 /hpf   Epithelial Cells (non renal) 0-10 0 - 10 /hpf   Mucus, UA Present (A) Not Estab.   Bacteria, UA Few (A) None seen/Few  Urinalysis, Complete  Result Value Ref Range   Specific Gravity, UA 1.025 1.005 - 1.030   pH, UA 5.0 5.0 - 7.5   Color, UA Yellow Yellow   Appearance Ur Cloudy (A) Clear   Leukocytes, UA 2+ (A) Negative   Protein, UA 2+ (A) Negative/Trace   Glucose, UA Trace (A) Negative   Ketones, UA Negative Negative   RBC, UA 2+ (A) Negative   Bilirubin, UA Negative Negative   Urobilinogen, Ur 0.2 0.2 - 1.0 mg/dL   Nitrite, UA Negative Negative   Microscopic Examination See below:   BLADDER SCAN AMB NON-IMAGING  Result Value Ref Range   Scan Result 182m     Pertinent Imaging: PVR as above  Assessment & Plan:    1. Benign prostatic hyperplasia with urinary obstruction Voiding spontaneously now status post TURP  Repeat urine culture to rule out incompletely treated infection Okay to DC finasteride at this time Continue Flomax for the time being Reassess symptoms in 3 months Post  void residual is low enough to discontinue clean intermittent catheterization, resume as needed if he develops urinary retention or voiding difficulties - Urinalysis, Complete - BLADDER SCAN AMB NON-IMAGING - CULTURE, URINE COMPREHENSIVE  2. [facility] diagnosed prostate cancer, probable Gleason 3+4 (exact diagnosis limited by cautery artifact) in 1% of TURP tissue   The patient was counseled about the natural history of prostate cancer and the standard treatment options that are available for prostate cancer. It was explained to him how his age and life expectancy, clinical stage, Gleason score, and PSA affect his prognosis, the decision to proceed with additional staging studies, as well as how that information influences recommended treatment strategies. We discussed the roles for active surveillance, radiation therapy, surgical therapy, androgen deprivation, as well as ablative therapy options for the treatment of prostate cancer as appropriate to his individual cancer situation. We discussed the risks and benefits of these options with regard to their impact on cancer control and also in terms of potential adverse events, complications, and impact on quality of life particularly related to urinary, bowel, and sexual function. The patient was encouraged to ask questions throughout the discussion today and all questions were answered to his stated satisfaction.   Given his age and comorbidities, will bring him back in 3 months to repeat PSA/rectal exam.  Based on these results, will determine if he needs a more formalized prostate biopsy.  He may be a good candidate for active surveillance.  He is agreeable this plan.  Return in about 3 months (around 12/01/2017), or PSA/ DRE, IPSS, PVR.  AHollice Espy MD  [facility] [address] [place]ron StationBKrem[place]ing [place] [phone]  I spent 25 min with this patient of which greater  than 50% was spent in counseling and coordination of care with the patient.   The majority of the discussion today was unrelated to his TURP.

## 2017-09-03 LAB — CULTURE, URINE COMPREHENSIVE

## 2017-09-10 ENCOUNTER — Encounter: Payer: Self-pay | Admitting: Unknown Physician Specialty

## 2017-09-10 ENCOUNTER — Ambulatory Visit (INDEPENDENT_AMBULATORY_CARE_PROVIDER_SITE_OTHER): Payer: Medicare HMO | Admitting: Unknown Physician Specialty

## 2017-09-10 VITALS — BP 127/68 | HR 79 | Temp 98.2°F | Ht 69.0 in | Wt 246.7 lb

## 2017-09-10 DIAGNOSIS — I10 Essential (primary) hypertension: Secondary | ICD-10-CM

## 2017-09-10 DIAGNOSIS — E782 Mixed hyperlipidemia: Secondary | ICD-10-CM

## 2017-09-10 DIAGNOSIS — N138 Other obstructive and reflux uropathy: Secondary | ICD-10-CM

## 2017-09-10 DIAGNOSIS — E1122 Type 2 diabetes mellitus with diabetic chronic kidney disease: Secondary | ICD-10-CM

## 2017-09-10 DIAGNOSIS — N401 Enlarged prostate with lower urinary tract symptoms: Secondary | ICD-10-CM | POA: Diagnosis not present

## 2017-09-10 DIAGNOSIS — E1165 Type 2 diabetes mellitus with hyperglycemia: Secondary | ICD-10-CM | POA: Diagnosis not present

## 2017-09-10 DIAGNOSIS — IMO0002 Reserved for concepts with insufficient information to code with codable children: Secondary | ICD-10-CM

## 2017-09-10 LAB — BAYER DCA HB A1C WAIVED: HB A1C (BAYER DCA - WAIVED): 8.5 % — ABNORMAL HIGH (ref <7.0)

## 2017-09-10 NOTE — Progress Notes (Signed)
BP 127/68   Pulse 79   Temp 98.2 F (36.8 C) (Oral)   Ht 5\' 9"  (1.753 m)   Wt 246 lb 11.2 oz (111.9 kg)   SpO2 97%   BMI 36.43 kg/m    Subjective:    Patient ID: Scott Lawson., male    DOB: 02/14/1941, 77 y.o.   MRN: [id number]  HPI: Vanderbilt Ranieri. is a 77 y.o. male  Chief Complaint  Patient presents with  . Diabetes  . Hyperlipidemia  . Hypertension   Diabetes: Using medications without difficulties. Last visit increased Glipizide to twice a day.  Taking 35 units in the AM No hypoglycemic episodes No hyperglycemic episodes Feet problems: Blood Sugars averaging: 100 in the AM.  280  eye exam within last year Last Hgb A1C: 8.8%  Hypertension  Using medications without difficulty Average home BPs Not checking   Using medication without problems or lightheadedness No chest pain with exertion or shortness of breath No Edema  Elevated Cholesterol Using medications without problems No Muscle aches  Diet: Cut back on everything he eats but still gaining weight.   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 Interim medical history since our last visit reviewed. Allergies and medications reviewed and updated.  Review of Systems  Constitutional: Negative.   Respiratory: Negative.   Cardiovascular: Negative.   Psychiatric/Behavioral: Negative.     Per HPI unless specifically indicated above     Objective:    BP 127/68   Pulse 79   Temp 98.2 F (36.8 C) (Oral)   Ht 5\' 9"  (1.753 m)   Wt 246 lb 11.2 oz (111.9 kg)   SpO2 97%   BMI 36.43 kg/m   Wt Readings from Last 3 Encounters:  09/10/17 246 lb 11.2 oz (111.9 kg)  08/18/17 240 lb (108.9 kg)  08/16/17 240 lb 3.2 oz (109 kg)    Physical Exam  Constitutional: He is oriented to person, place, and time. He appears well-developed and well-nourished. No distress.  HENT:  Head: Normocephalic and atraumatic.  Eyes:  Conjunctivae and lids are normal. Right eye exhibits no discharge. Left eye exhibits no discharge. No scleral icterus.  Neck: Normal range of motion. Neck supple. No JVD present. Carotid bruit is not present.  Cardiovascular: Normal rate, regular rhythm and normal heart sounds.  Pulmonary/Chest: Effort normal and breath sounds normal. No respiratory distress.  Abdominal: Normal appearance. There is no splenomegaly or hepatomegaly.  Musculoskeletal: Normal range of motion.  Neurological: He is alert and oriented to person, place, and time.  Skin: Skin is warm, dry and intact. No rash noted. No pallor.  Psychiatric: He has a normal mood and affect. His behavior is normal. Judgment and thought content normal.       Assessment & Plan:   Problem List Items Addressed This Visit      Unprioritized   BPH (benign prostatic hyperplasia)    Recent TURP.  Pt states they found "a smidge" of prostate cancer.  Pt concerned.  Discussed Prostate cancer typical trajectory.  Reassured.        Essential hypertension, benign    Stable, continue present medications.        Hyperlipidemia    Stable, continue present medications.        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  Start .  25 mg of Ozempic weekly.  Sample given.  First dose given in office.  Recheck in 4 weeks  Will also be more active with warmer weather      Relevant Orders   Comprehensive metabolic panel   Bayer DCA Hb A1c Waived       Follow up plan: Return in about 1 month (around 10/08/2017).

## 2017-09-10 NOTE — Assessment & Plan Note (Signed)
Stable, continue present medications.   

## 2017-09-10 NOTE — Assessment & Plan Note (Signed)
Recent TURP.  Pt states they found "a smidge" of prostate cancer.  Pt concerned.  Discussed Prostate cancer typical trajectory.  Reassured.

## 2017-09-10 NOTE — Assessment & Plan Note (Addendum)
Hgb A1C is 8.5%  Showing poor control.  Start .25 mg of Ozempic weekly.  Sample given.  First dose given in office.  Recheck in 4 weeks  Will also be more active with warmer weather

## 2017-09-11 LAB — COMPREHENSIVE METABOLIC PANEL
A/G RATIO: 1.7 (ref 1.2–2.2)
ALBUMIN: 4.3 g/dL (ref 3.5–4.8)
ALT: 20 IU/L (ref 0–44)
AST: 22 IU/L (ref 0–40)
Alkaline Phosphatase: 63 IU/L (ref 39–117)
BUN/Creatinine Ratio: 20 (ref 10–24)
BUN: 28 mg/dL — ABNORMAL HIGH (ref 8–27)
Bilirubin Total: 0.5 mg/dL (ref 0.0–1.2)
CALCIUM: 9.2 mg/dL (ref 8.6–10.2)
CO2: 20 mmol/L (ref 20–29)
CREATININE: 1.41 mg/dL — AB (ref 0.76–1.27)
Chloride: 103 mmol/L (ref 96–106)
GFR calc non Af Amer: 48 mL/min/{1.73_m2} — ABNORMAL LOW (ref >59)
GFR, EST AFRICAN AMERICAN: 56 mL/min/{1.73_m2} — AB (ref >59)
GLOBULIN, TOTAL: 2.5 g/dL (ref 1.5–4.5)
Glucose: 285 mg/dL — ABNORMAL HIGH (ref 65–99)
POTASSIUM: 5 mmol/L (ref 3.5–5.2)
SODIUM: 139 mmol/L (ref 134–144)
Total Protein: 6.8 g/dL (ref 6.0–8.5)

## 2017-09-14 ENCOUNTER — Encounter: Payer: Self-pay | Admitting: Unknown Physician Specialty

## 2017-09-20 ENCOUNTER — Ambulatory Visit: Payer: Medicare HMO | Admitting: Urology

## 2017-09-24 ENCOUNTER — Other Ambulatory Visit: Payer: Self-pay | Admitting: Unknown Physician Specialty

## 2017-09-27 DIAGNOSIS — H26492 Other secondary cataract, left eye: Secondary | ICD-10-CM | POA: Diagnosis not present

## 2017-09-27 DIAGNOSIS — H26491 Other secondary cataract, right eye: Secondary | ICD-10-CM | POA: Diagnosis not present

## 2017-09-27 LAB — HM DIABETES EYE EXAM

## 2017-10-12 ENCOUNTER — Other Ambulatory Visit: Payer: Self-pay

## 2017-10-12 ENCOUNTER — Ambulatory Visit (INDEPENDENT_AMBULATORY_CARE_PROVIDER_SITE_OTHER): Payer: Medicare HMO | Admitting: Unknown Physician Specialty

## 2017-10-12 ENCOUNTER — Encounter: Payer: Self-pay | Admitting: Unknown Physician Specialty

## 2017-10-12 DIAGNOSIS — E1122 Type 2 diabetes mellitus with diabetic chronic kidney disease: Secondary | ICD-10-CM | POA: Diagnosis not present

## 2017-10-12 DIAGNOSIS — IMO0002 Reserved for concepts with insufficient information to code with codable children: Secondary | ICD-10-CM

## 2017-10-12 DIAGNOSIS — E1165 Type 2 diabetes mellitus with hyperglycemia: Secondary | ICD-10-CM

## 2017-10-12 MED ORDER — SEMAGLUTIDE(0.25 OR 0.5MG/DOS) 2 MG/1.5ML ~~LOC~~ SOPN: 0.5000 mg | PEN_INJECTOR | SUBCUTANEOUS | 3 refills | Status: DC

## 2017-10-12 NOTE — Progress Notes (Signed)
   BP 109/70   Pulse 61   Temp 98 F (36.7 C) (Oral)   Ht 5\' 9"  (1.753 m)   Wt 238 lb 3.2 oz (108 kg)   SpO2 98%   BMI 35.18 kg/m    Subjective:    Patient ID: Scott Roof., male    DOB: [birth year], 77 y.o.   MRN: [id number]  HPI: Scott Gitto. is a 77 y.o. male  Chief Complaint  Patient presents with  . Diabetes    4 week f/up- started ozempic last visit   Diabetes Last visit we started the [place] .25mg .  For Hgb A1C's consistently 8.5% and above.  Pt taking .25 mg every Friday.  States appetite is decreased and feels good and not so "stuffed." He is taking 35 units of Lantus and blood sugar a little over 100 in the AM.    Relevant past medical, surgical, family and social history reviewed and updated as indicated. Interim medical history since our last visit reviewed. Allergies and medications reviewed and updated.  Review of Systems  Per HPI unless specifically indicated above     Objective:    BP 109/70   Pulse 61   Temp 98 F (36.7 C) (Oral)   Ht 5\' 9"  (1.753 m)   Wt 238 lb 3.2 oz (108 kg)   SpO2 98%   BMI 35.18 kg/m   Wt Readings from Last 3 Encounters:  10/12/17 238 lb 3.2 oz (108 kg)  09/10/17 246 lb 11.2 oz (111.9 kg)  08/18/17 240 lb (108.9 kg)    Physical Exam  Constitutional: He is oriented to person, place, and time. He appears well-developed and well-nourished. No distress.  HENT:  Head: Normocephalic and atraumatic.  Eyes: Conjunctivae and lids are normal. Right eye exhibits no discharge. Left eye exhibits no discharge. No scleral icterus.  Cardiovascular: Normal rate.  Pulmonary/Chest: Effort normal.  Abdominal: Normal appearance. There is no splenomegaly or hepatomegaly.  Musculoskeletal: Normal range of motion.  Neurological: He is alert and oriented to person, place, and time.  Skin: Skin is intact. No rash noted. No pallor.  Psychiatric: He has a normal mood and affect. His behavior is normal. Judgment and thought  content normal.    Results for orders placed or performed in visit on 09/27/17  HM DIABETES EYE EXAM  Result Value Ref Range   HM Diabetic Eye Exam No Retinopathy No Retinopathy      Assessment & Plan:   Problem List Items Addressed This Visit      Unprioritized   Uncontrolled type 2 diabetes mellitus with chronic kidney disease ([place])    Doing well with Ozempic.  Increase Ozempic from .25 mg to .5mg .  Decrease Lantus from 35-30u to decrease chance of hypoglycemia.  Follow AM blood sugar and written pt ed on further titration if needed.        Relevant Medications   Semaglutide (OZEMPIC) 0.25 or 0.5 MG/DOSE SOPN       Follow up plan: Return in about 2 months (around 12/12/2017).

## 2017-10-12 NOTE — Assessment & Plan Note (Signed)
Doing well with Ozempic.  Increase Ozempic from .25 mg to .5mg .  Decrease Lantus from 35-30u to decrease chance of hypoglycemia.  Follow AM blood sugar and written pt ed on further titration if needed.

## 2017-10-12 NOTE — Patient Instructions (Signed)
Base your long acting insulin on your fasting (usually in the morning) blood sugar.  Increase long acting (daily insulin) 2 units if fasting blood sugar is greater than 120.  Decrease by 2 units if fasting blood sugar is less than 95.    

## 2017-10-13 DIAGNOSIS — H26491 Other secondary cataract, right eye: Secondary | ICD-10-CM | POA: Diagnosis not present

## 2017-11-09 ENCOUNTER — Ambulatory Visit: Payer: Self-pay | Admitting: *Deleted

## 2017-11-09 NOTE — Progress Notes (Signed)
11/10/2017 2:09 PM   Scott Lawson. July 22, 1940 [id number]  Referring provider: Kathrine Haddock, NP [address], [place] [address]  Chief Complaint  Patient presents with  . Benign Prostatic Hypertrophy    HPI: Patient states he witnessed gross hematuria after CIC.  Background history 77 year old male with a history of BPH with bladder outlet obstruction, urinary retention previously on clean intermittent catheterization who underwent transurethral resection of his prostate on 08/02/2017.    His postoperative course was complicated by urinary tract infection, staph aureus, treated with Levaquin.  He just completed this medicine a few days ago.  He reports that this was not a well-tolerated medication and caused him to feel malaise and back pain.  Once the medicine was completed a really partial  Surgical pathology does show incidental prostate cancer involving 1% of the tissue submitted.  Histology favors Gleason 3+4 but this was limited due to cautery artifact.  There is also evidence of mixed acute and chronic inflammation.  A total of 11 g was resected.  PVR 122 cc today.  Previous PVR was 119 cc.  He has been cathing occasionally at home to check his postvoid residuals.  This is been around 100 cc.  He does have a history of remotely elevated PSA, although his most recent PSA was 1.9 on 05/2016 and further PSA screening has been discontinued.  A repeat PSA is pending on 12/06/2017 with follow up appointment with Dr. Erlene Quan on 12/08/2017 due to the findings of prostate cancer found in chips.  See surgical pathology above.    Preop TRUS vol 45 cc.  Cystoscopy revealed a heavily trabeculated bladder with multiple saccules.  He has discontinued the Flomax and finasteride.   He is having suprapubic pain and malaise for the last four weeks.  He states on Monday 11/08/2017 he CIC, he saw fresh blood over the catheter.  He was cathing to check for residuals.  The cathing was not  painful.  He hasn't cathed since that time.  He is not seeing any hematuria with urination.  He did notice that the urine has been dark.  He increased his fluids and the urine lightens up.  Patient denies any  dysuria or flank pain.  Patient denies any fevers, chills, nausea or vomiting.  He is not having constipation.  He has had two episodes of diarrhea last week.    PMH: Past Medical History:  Diagnosis Date  . BPH (benign prostatic hyperplasia)   . Diabetes mellitus without complication ([place])   . Elevated PSA   . GERD (gastroesophageal reflux disease)   . Hypertension   . Hypertriglyceridemia   . Nocturia     Surgical History: Past Surgical History:  Procedure Laterality Date  . APPENDECTOMY    . CARPAL TUNNEL RELEASE Left 12/11/2015   Procedure: CARPAL TUNNEL RELEASE;  Surgeon: Earnestine Leys, MD;  Location: [facility] ORS;  Service: Orthopedics;  Laterality: Left;  . EYE SURGERY     laser  . TRANSURETHRAL RESECTION OF PROSTATE N/A 08/02/2017   Procedure: TRANSURETHRAL RESECTION OF THE PROSTATE (TURP);  Surgeon: Hollice Espy, MD;  Location: [facility] ORS;  Service: Urology;  Laterality: N/A;    Home Medications:  Allergies as of 11/10/2017      Reactions   Gabapentin Other (See Comments)   "made me fall out"      Medication List        Accurate as of 11/10/17  2:09 PM. Always use your most recent med list.  ACCU-CHEK FASTCLIX LANCETS Misc Use to check blood sugar twice daily   ACCU-CHEK NANO SMARTVIEW w/Device Kit   aspirin 81 MG tablet Take 81 mg by mouth daily.   atorvastatin 40 MG tablet Commonly known as:  LIPITOR Take 1 tablet (40 mg total) by mouth daily.   augmented betamethasone dipropionate 0.05 % cream Commonly known as:  DIPROLENE AF Apply topically 2 (two) times daily.   B-D SINGLE USE SWABS REGULAR Pads   docusate sodium 100 MG capsule Commonly known as:  COLACE Take 1 capsule (100 mg total) by mouth 2 (two) times daily.   finasteride 5 MG  tablet Commonly known as:  PROSCAR TAKE 1 TABLET EVERY DAY   fluticasone 50 MCG/ACT nasal spray Commonly known as:  FLONASE Place 2 sprays into both nostrils daily as needed.   glipiZIDE 10 MG tablet Commonly known as:  GLUCOTROL Take 1 tablet (10 mg total) by mouth 2 (two) times daily before a meal.   glucose blood test strip 1 each by Other route as needed for other. Use as instructed   insulin glargine 100 UNIT/ML injection Commonly known as:  LANTUS Inject 35 Units into the skin every evening. 1800   lisinopril 20 MG tablet Commonly known as:  PRINIVIL,ZESTRIL Take 1 tablet (20 mg total) by mouth daily.   lisinopril-hydrochlorothiazide 20-12.5 MG tablet Commonly known as:  PRINZIDE,ZESTORETIC TAKE 1 TABLET EVERY DAY   meloxicam 15 MG tablet Commonly known as:  MOBIC Take 15 mg by mouth daily.   metFORMIN 500 MG tablet Commonly known as:  GLUCOPHAGE Take 1 tablet (500 mg total) by mouth 2 (two) times daily with a meal.   nitrofurantoin (macrocrystal-monohydrate) 100 MG capsule Commonly known as:  MACROBID Take 1 capsule (100 mg total) by mouth every 12 (twelve) hours.   nystatin-triamcinolone ointment Commonly known as:  MYCOLOG Apply 1 application topically 2 (two) times daily.   omeprazole 20 MG capsule Commonly known as:  PRILOSEC Take 1 capsule (20 mg total) by mouth daily.   Semaglutide 0.25 or 0.5 MG/DOSE Sopn Commonly known as:  OZEMPIC Inject 0.5 mg into the skin once a week.   tamsulosin 0.4 MG Caps capsule Commonly known as:  FLOMAX TAKE 1 CAPSULE EVERY DAY   TRUE METRIX LEVEL 1 Low Soln       Allergies:  Allergies  Allergen Reactions  . Gabapentin Other (See Comments)    "made me fall out"    Family History: Family History  Problem Relation Age of Onset  . Cancer Mother        lung  . Heart disease Father   . Diabetes Brother   . Prostate cancer Neg Hx   . Bladder Cancer Neg Hx   . Kidney cancer Neg Hx     Social History:   reports that he has never smoked. He has never used smokeless tobacco. He reports that he does not drink alcohol or use drugs.  ROS: UROLOGY Frequent Urination?: No Hard to postpone urination?: No Burning/pain with urination?: No Get up at night to urinate?: No Leakage of urine?: No Urine stream starts and stops?: No Trouble starting stream?: No Do you have to strain to urinate?: No Blood in urine?: No Urinary tract infection?: No Sexually transmitted disease?: No Injury to kidneys or bladder?: No Painful intercourse?: No Weak stream?: No Erection problems?: No Penile pain?: No  Gastrointestinal Nausea?: No Vomiting?: No Indigestion/heartburn?: No Diarrhea?: No Constipation?: No  Constitutional Fever: No Night sweats?: No Weight loss?: No  Fatigue?: No  Skin Skin rash/lesions?: No Itching?: No  Eyes Blurred vision?: No Double vision?: No  Ears/Nose/Throat Sore throat?: No Sinus problems?: No  Hematologic/Lymphatic Swollen glands?: No Easy bruising?: No  Cardiovascular Leg swelling?: No Chest pain?: No  Respiratory Cough?: No Shortness of breath?: No  Endocrine Excessive thirst?: No  Musculoskeletal Back pain?: No Joint pain?: No  Neurological Headaches?: No Dizziness?: No  Psychologic Depression?: No Anxiety?: No  Physical Exam: BP 116/68   Pulse 68   Ht 5' 9"  (1.753 m)   Wt 233 lb 6.4 oz (105.9 kg)   BMI 34.47 kg/m   Constitutional: Well nourished. Alert and oriented, No acute distress. HEENT: [place] AT, moist mucus membranes. Trachea midline, no masses. Cardiovascular: No clubbing, cyanosis, or edema. Respiratory: Normal respiratory effort, no increased work of breathing. GI: Abdomen is soft, non tender, non distended, no abdominal masses. Liver and spleen not palpable.  No hernias appreciated.  Stool sample for occult testing is not indicated.   GU: No CVA tenderness.  No bladder fullness or masses.  Patient with circumcised  phallus.  Urethral meatus is patent.  Dried talcum powder on glans.  Small 3 mm x 3 mm erythematous lesion located to the left side of the meatus on the glands.  5 mm x 7 mm erythematous lesion located on the coronal ridge on the left side.  Patient states these come and go every few months.  No penile discharge. No penile lesions or rashes. Scrotum without lesions, cysts, rashes and/or edema.  Testicles are located scrotally bilaterally. No masses are appreciated in the testicles. Left and right epididymis are normal. Rectal: Patient with  normal sphincter tone. Anus and perineum without scarring or rashes. No rectal masses are appreciated. Prostate is approximately 60 grams, right lobe > left lobe, no nodules are appreciated. Seminal vesicles are normal. Skin: No rashes, bruises or suspicious lesions. Lymph: No cervical or inguinal adenopathy. Neurologic: Grossly intact, no focal deficits, moving all 4 extremities. Psychiatric: Normal mood and affect.   Laboratory Data: Lab Results  Component Value Date   WBC 7.5 08/03/2017   HGB 12.0 (L) 08/03/2017   HCT 35.1 (L) 08/03/2017   MCV 90.2 08/03/2017   PLT 173 08/03/2017    Lab Results  Component Value Date   CREATININE 1.41 (H) 09/10/2017     Lab Results  Component Value Date   HGBA1C 7.1% 03/03/2016    Urinalysis 11-30 WBC's.  Many bacteria.  See Epic.  I have reviewed the labs.  Pertinent Imaging: PVR as above  Assessment & Plan:    1. Gross hematuria May be due to traumatic CIC and/or UTI UA today is negative for hematuria, but positive for 11-30 WBC's and many bacteria  Urine sent for culture, started on Macrobid - will adjust once culture is available If urine culture is negative, may need to pursue upper tract studies  2. Benign prostatic hyperplasia with urinary obstruction Voiding spontaneously now status post TURP  Reassess symptoms in 3 months - appointment is scheduled for 12/08/2017 Reminded patient that his  post void residual is low enough to discontinue clean intermittent catheterization, resume as needed if he develops urinary retention or voiding difficulties  - BLADDER SCAN AMB NON-IMAGING  3. Prostate cancer [facility]) Incidental newly diagnosed prostate cancer, probable Gleason 3+4 (exact diagnosis limited by cautery artifact) in 1% of TURP tissue  Given his age and comorbidities, will bring him back in 3 months to repeat PSA/rectal exam.  Based on these results,  will determine if he needs a more formalized prostate biopsy.  He may be a good candidate for active surveillance. Appointments scheduled for June  4. Balanitis Sent mycolog cream to the pharmacy bid   Return for keep June appointments.  Zara Council, PA-C  [facility] [address] [place], [place] [address] [phone]

## 2017-11-09 NOTE — Telephone Encounter (Signed)
Pt  Reports  No blood in urine at this time - however reports  When he catherized himself yesterday he noticed some dark red  Blood. He states he is voiding OK at this time . He states he has some low abdominal pain descibed as a tingling. No availability at  [facility] today  or tommorow. Pt advised to go to urgent care he states he will try to make an appointment with his urologist    Reason for Disposition . Blood in urine  (Exception: could be normal menstrual bleeding)  Answer Assessment - Initial Assessment Questions 1. COLOR of URINE: "Describe the color of the urine."  (e.g., tea-colored, pink, red, blood clots, bloody)       Light  Yellow -   2. ONSET: "When did the bleeding start?"        Catherized  Himself yest  And noticed dark red liquid   3. EPISODES: "How many times has there been blood in the urine?" or "How many times today?"         No  Blood  Today when urinates    4. PAIN with URINATION: "Is there any pain with passing your urine?" If so, ask: "How bad is the pain?"  (Scale 1-10; or mild, moderate, severe)    - MILD - complains slightly about urination hurting    - MODERATE - interferes with normal activities      - SEVERE - excruciating, unwilling or unable to urinate because of the pain        No  5. FEVER: "Do you have a fever?" If so, ask: "What is your temperature, how was it measured, and when did it start?"         No  Fever    6. ASSOCIATED SYMPTOMS: "Are you passing urine more frequently than usual?"          Normal   Routine    7. OTHER SYMPTOMS: "Do you have any other symptoms?" (e.g., back/flank pain, abdominal pain, vomiting)            Low  abd  Pain    8. PREGNANCY: "Is there any chance you are pregnant?" "When was your last menstrual period?"      N/a  Protocols used: URINE - BLOOD IN-A-AH

## 2017-11-10 ENCOUNTER — Encounter: Payer: Self-pay | Admitting: Urology

## 2017-11-10 ENCOUNTER — Ambulatory Visit: Payer: Medicare HMO | Admitting: Urology

## 2017-11-10 VITALS — BP 116/68 | HR 68 | Ht 69.0 in | Wt 233.4 lb

## 2017-11-10 DIAGNOSIS — N401 Enlarged prostate with lower urinary tract symptoms: Secondary | ICD-10-CM

## 2017-11-10 DIAGNOSIS — N138 Other obstructive and reflux uropathy: Secondary | ICD-10-CM | POA: Diagnosis not present

## 2017-11-10 DIAGNOSIS — N481 Balanitis: Secondary | ICD-10-CM

## 2017-11-10 DIAGNOSIS — C61 Malignant neoplasm of prostate: Secondary | ICD-10-CM

## 2017-11-10 DIAGNOSIS — R310 Gross hematuria: Secondary | ICD-10-CM | POA: Diagnosis not present

## 2017-11-10 LAB — BLADDER SCAN AMB NON-IMAGING: Scan Result: 122

## 2017-11-10 LAB — MICROSCOPIC EXAMINATION

## 2017-11-10 LAB — URINALYSIS, COMPLETE
Bilirubin, UA: NEGATIVE
Glucose, UA: NEGATIVE
Ketones, UA: NEGATIVE
Nitrite, UA: NEGATIVE
PH UA: 5.5 (ref 5.0–7.5)
PROTEIN UA: NEGATIVE
Specific Gravity, UA: 1.02 (ref 1.005–1.030)
Urobilinogen, Ur: 0.2 mg/dL (ref 0.2–1.0)

## 2017-11-10 MED ORDER — NITROFURANTOIN MONOHYD MACRO 100 MG PO CAPS: 100.0000 mg | ORAL_CAPSULE | Freq: Two times a day (BID) | ORAL | 0 refills | Status: DC

## 2017-11-10 MED ORDER — NYSTATIN-TRIAMCINOLONE 100000-0.1 UNIT/GM-% EX OINT: 1.0000 "application " | TOPICAL_OINTMENT | Freq: Two times a day (BID) | CUTANEOUS | 0 refills | Status: DC

## 2017-11-13 LAB — CULTURE, URINE COMPREHENSIVE

## 2017-11-15 ENCOUNTER — Telehealth: Payer: Self-pay

## 2017-11-15 MED ORDER — CIPROFLOXACIN HCL 500 MG PO TABS: 250.0000 mg | ORAL_TABLET | Freq: Two times a day (BID) | ORAL | 0 refills | Status: AC

## 2017-11-15 NOTE — Telephone Encounter (Signed)
-----   Message from Nori Riis, PA-C sent at 11/14/2017  1:13 PM EDT ----- Please let Scott Lawson know that his urine culture was positive for infection, but the bacteria is resistant to the Macrobid that we started.  He needs to discontinue that antibiotic and start Cipro 250 mg daily, bid for seven days.  He had malaise and back pain with the Levaquin.  Cipro is a similar antibiotic, so if he should start experiencing these symptoms he should contact the office.

## 2017-11-15 NOTE — Telephone Encounter (Signed)
Pt informed, he is going to stop the Macrobid and start the Cipro. Pt instructed to contact office if any side effects occur.

## 2017-11-19 ENCOUNTER — Ambulatory Visit (INDEPENDENT_AMBULATORY_CARE_PROVIDER_SITE_OTHER): Payer: Medicare HMO

## 2017-11-19 VITALS — BP 121/66 | HR 95 | Temp 98.5°F | Resp 16 | Ht 69.0 in | Wt 228.1 lb

## 2017-11-19 DIAGNOSIS — Z0000 Encounter for general adult medical examination without abnormal findings: Secondary | ICD-10-CM

## 2017-11-19 NOTE — Patient Instructions (Signed)
Scott Lawson , Thank you for taking time to come for your Medicare Wellness Visit. I appreciate your ongoing commitment to your health goals. Please review the following plan we discussed and let me know if I can assist you in the future.   Screening recommendations/referrals: Colonoscopy: no longer required Recommended yearly ophthalmology/optometry visit for glaucoma screening and checkup Recommended yearly dental visit for hygiene and checkup  Vaccinations: Influenza vaccine: due 02/2018 Pneumococcal vaccine: up to date Tdap vaccine: up to date Shingles vaccine: shingrix eligible, check with your insurance company for coverage information    Advanced directives: Advance directive discussed with you today. I have provided a copy for you to complete at home and have notarized. Once this is complete please bring a copy in to our office so we can scan it into your chart.  Conditions/risks identified: Recommend drinking at least 6-8 glasses of water a day   Next appointment: Follow up on 12/14/2017 at 10:45am with Scott Lawson. Follow up in one year for your annual wellness exam.   Preventive Care 65 Years and Older, Male Preventive care refers to lifestyle choices and visits with your health care provider that can promote health and wellness. What does preventive care include?  A yearly physical exam. This is also called an annual well check.  Dental exams once or twice a year.  Routine eye exams. Ask your health care provider how often you should have your eyes checked.  Personal lifestyle choices, including:  Daily care of your teeth and gums.  Regular physical activity.  Eating a healthy diet.  Avoiding tobacco and drug use.  Limiting alcohol use.  Practicing safe sex.  Taking low doses of aspirin every day.  Taking vitamin and mineral supplements as recommended by your health care provider. What happens during an annual well check? The services and screenings done  by your health care provider during your annual well check will depend on your age, overall health, lifestyle risk factors, and family history of disease. Counseling  Your health care provider may ask you questions about your:  Alcohol use.  Tobacco use.  Drug use.  Emotional well-being.  Home and relationship well-being.  Sexual activity.  Eating habits.  History of falls.  Memory and ability to understand (cognition).  Work and work Statistician. Screening  You may have the following tests or measurements:  Height, weight, and BMI.  Blood pressure.  Lipid and cholesterol levels. These may be checked every 5 years, or more frequently if you are over 58 years old.  Skin check.  Lung cancer screening. You may have this screening every year starting at age 75 if you have a 30-pack-year history of smoking and currently smoke or have quit within the past 15 years.  Fecal occult blood test (FOBT) of the stool. You may have this test every year starting at age 83.  Flexible sigmoidoscopy or colonoscopy. You may have a sigmoidoscopy every 5 years or a colonoscopy every 10 years starting at age 45.  Prostate cancer screening. Recommendations will vary depending on your family history and other risks.  Hepatitis C blood test.  Hepatitis B blood test.  Sexually transmitted disease (STD) testing.  Diabetes screening. This is done by checking your blood sugar (glucose) after you have not eaten for a while (fasting). You may have this done every 1-3 years.  Abdominal aortic aneurysm (AAA) screening. You may need this if you are a current or former smoker.  Osteoporosis. You may be screened starting at  age 48 if you are at high risk. Talk with your health care provider about your test results, treatment options, and if necessary, the need for more tests. Vaccines  Your health care provider may recommend certain vaccines, such as:  Influenza vaccine. This is recommended every  year.  Tetanus, diphtheria, and acellular pertussis (Tdap, Td) vaccine. You may need a Td booster every 10 years.  Zoster vaccine. You may need this after age 70.  Pneumococcal 13-valent conjugate (PCV13) vaccine. One dose is recommended after age 26.  Pneumococcal polysaccharide (PPSV23) vaccine. One dose is recommended after age 55. Talk to your health care provider about which screenings and vaccines you need and how often you need them. This information is not intended to replace advice given to you by your health care provider. Make sure you discuss any questions you have with your health care provider. Document Released: 06/28/2015 Document Revised: 02/19/2016 Document Reviewed: 04/02/2015 Elsevier Interactive Patient Education  2017 [place] Prevention in the Home Falls can cause injuries. They can happen to people of all ages. There are many things you can do to make your home safe and to help prevent falls. What can I do on the outside of my home?  Regularly fix the edges of walkways and driveways and fix any cracks.  Remove anything that might make you trip as you walk through a door, such as a raised step or threshold.  Trim any bushes or trees on the path to your home.  Use bright outdoor lighting.  Clear any walking paths of anything that might make someone trip, such as rocks or tools.  Regularly check to see if handrails are loose or broken. Make sure that both sides of any steps have handrails.  Any raised decks and porches should have guardrails on the edges.  Have any leaves, snow, or ice cleared regularly.  Use sand or salt on walking paths during winter.  Clean up any spills in your garage right away. This includes oil or grease spills. What can I do in the bathroom?  Use night lights.  Install grab bars by the toilet and in the tub and shower. Do not use towel bars as grab bars.  Use non-skid mats or decals in the tub or shower.  If you  need to sit down in the shower, use a plastic, non-slip stool.  Keep the floor dry. Clean up any water that spills on the floor as soon as it happens.  Remove soap buildup in the tub or shower regularly.  Attach bath mats securely with double-sided non-slip rug tape.  Do not have throw rugs and other things on the floor that can make you trip. What can I do in the bedroom?  Use night lights.  Make sure that you have a light by your bed that is easy to reach.  Do not use any sheets or blankets that are too big for your bed. They should not hang down onto the floor.  Have a firm chair that has side arms. You can use this for support while you get dressed.  Do not have throw rugs and other things on the floor that can make you trip. What can I do in the kitchen?  Clean up any spills right away.  Avoid walking on wet floors.  Keep items that you use a lot in easy-to-reach places.  If you need to reach something above you, use a strong step stool that has a grab bar.  Keep electrical cords out of the way.  Do not use floor polish or wax that makes floors slippery. If you must use wax, use non-skid floor wax.  Do not have throw rugs and other things on the floor that can make you trip. What can I do with my stairs?  Do not leave any items on the stairs.  Make sure that there are handrails on both sides of the stairs and use them. Fix handrails that are broken or loose. Make sure that handrails are as long as the stairways.  Check any carpeting to make sure that it is firmly attached to the stairs. Fix any carpet that is loose or worn.  Avoid having throw rugs at the top or bottom of the stairs. If you do have throw rugs, attach them to the floor with carpet tape.  Make sure that you have a light switch at the top of the stairs and the bottom of the stairs. If you do not have them, ask someone to add them for you. What else can I do to help prevent falls?  Wear shoes  that:  Do not have high heels.  Have rubber bottoms.  Are comfortable and fit you well.  Are closed at the toe. Do not wear sandals.  If you use a stepladder:  Make sure that it is fully opened. Do not climb a closed stepladder.  Make sure that both sides of the stepladder are locked into place.  Ask someone to hold it for you, if possible.  Clearly mark and make sure that you can see:  Any grab bars or handrails.  First and last steps.  Where the edge of each step is.  Use tools that help you move around (mobility aids) if they are needed. These include:  Canes.  Walkers.  Scooters.  Crutches.  Turn on the lights when you go into a dark area. Replace any light bulbs as soon as they burn out.  Set up your furniture so you have a clear path. Avoid moving your furniture around.  If any of your floors are uneven, fix them.  If there are any pets around you, be aware of where they are.  Review your medicines with your doctor. Some medicines can make you feel dizzy. This can increase your chance of falling. Ask your doctor what other things that you can do to help prevent falls. This information is not intended to replace advice given to you by your health care provider. Make sure you discuss any questions you have with your health care provider. Document Released: 03/28/2009 Document Revised: 11/07/2015 Document Reviewed: 07/06/2014 Elsevier Interactive Patient Education  2017 [employer].

## 2017-11-19 NOTE — Telephone Encounter (Signed)
Patient called the office this afternoon.  He is experiencing stomach pain, diarrhea and vomiting, he believes from the antibiotics. He does not think the antibiotics are working. He only has 2 more days left to take.    Please advise patient.  He can be reached [phone].

## 2017-11-19 NOTE — Progress Notes (Signed)
Subjective:   Scott Lawson. is a 77 y.o. male who presents for Medicare Annual/Subsequent preventive examination.  Review of Systems:   Cardiac Risk Factors include: advanced age (>43mn, >>16women);diabetes mellitus;hypertension;dyslipidemia;male gender;obesity (BMI >30kg/m2)     Objective:    Vitals: BP 121/66 (BP Location: Left Arm, Patient Position: Sitting)   Pulse 95   Temp 98.5 F (36.9 C) (Temporal)   Resp 16   Ht 5' 9"  (1.753 m)   Wt 228 lb 1.6 oz (103.5 kg)   BMI 33.68 kg/m   Body mass index is 33.68 kg/m.  Advanced Directives 11/19/2017 08/02/2017 07/29/2017 11/18/2016 12/02/2015 11/07/2015 08/14/2015  Does Patient Have a Medical Advance Directive? No No No Yes Yes Yes Yes  Type of Advance Directive - - -Public librarianLiving will Living will [place]  Does patient want to make changes to medical advance directive? Yes (MAU/Ambulatory/Procedural Areas - Information given) - - - - - No - Patient declined  Copy of Healthcare Power of Attorney in Chart? - - - No - copy requested No - copy requested - No - copy requested  Would patient like information on creating a medical advance directive? - No - Patient declined No - Patient declined - - - No - patient declined information    Tobacco Social History   Tobacco Use  Smoking Status Never Smoker  Smokeless Tobacco Never Used     Counseling given: Not Answered   Clinical Intake:  Pre-visit preparation completed: Yes  Pain : 0-10 Pain Score: 8  Pain Type: Acute pain Pain Location: Abdomen Pain Orientation: Lower Pain Descriptors / Indicators: Aching Pain Onset: More than a month ago Pain Frequency: Intermittent     Nutritional Status: BMI > 30  Obese Nutritional Risks: None Diabetes: Yes CBG done?: No Did pt. bring in CBG monitor from home?: No  How often do you need to have someone help you when you read instructions, pamphlets, or other written  materials from your doctor or pharmacy?: 1 - Never What is the last grade level you completed in school?: 12th grade  Interpreter Needed?: No  Information entered by :: Malana Eberwein,LPN   Past Medical History:  Diagnosis Date  . BPH (benign prostatic hyperplasia)   . Diabetes mellitus without complication (HTropic   . Elevated PSA   . GERD (gastroesophageal reflux disease)   . Hypertension   . Hypertriglyceridemia   . Nocturia    Past Surgical History:  Procedure Laterality Date  . APPENDECTOMY    . CARPAL TUNNEL RELEASE Left 12/11/2015   Procedure: CARPAL TUNNEL RELEASE;  Surgeon: HEarnestine Leys MD;  Location: [facility] ORS;  Service: Orthopedics;  Laterality: Left;  . EYE SURGERY     laser  . TRANSURETHRAL RESECTION OF PROSTATE N/A 08/02/2017   Procedure: TRANSURETHRAL RESECTION OF THE PROSTATE (TURP);  Surgeon: BHollice Espy MD;  Location: [facility] ORS;  Service: Urology;  Laterality: N/A;   Family History  Problem Relation Age of Onset  . Cancer Mother        lung  . Heart disease Father   . Diabetes Brother   . Prostate cancer Neg Hx   . Bladder Cancer Neg Hx   . Kidney cancer Neg Hx    Social History   Socioeconomic History  . Marital status: Married    Spouse name: Not on file  . Number of children: Not on file  . Years of education: Not on file  .  Highest education level: Not on file  Occupational History  . Not on file  Social Needs  . Financial resource strain: Not hard at all  . Food insecurity:    Worry: Never true    Inability: Never true  . Transportation needs:    Medical: No    Non-medical: No  Tobacco Use  . Smoking status: Never Smoker  . Smokeless tobacco: Never Used  Substance and Sexual Activity  . Alcohol use: No    Alcohol/week: 0.0 oz  . Drug use: No  . Sexual activity: Yes  Lifestyle  . Physical activity:    Days per week: 0 days    Minutes per session: 0 min  . Stress: Not at all  Relationships  . Social connections:    Talks on  phone: More than three times a week    Gets together: More than three times a week    Attends religious service: More than 4 times per year    Active member of club or organization: No    Attends meetings of clubs or organizations: Never    Relationship status: Widowed  Other Topics Concern  . Not on file  Social History Narrative  . Not on file    Outpatient Encounter Medications as of 11/19/2017  Medication Sig  . ACCU-CHEK FASTCLIX LANCETS MISC Use to check blood sugar twice daily  . Alcohol Swabs (B-D SINGLE USE SWABS REGULAR) PADS   . aspirin 81 MG tablet Take 81 mg by mouth daily.  Marland Kitchen atorvastatin (LIPITOR) 40 MG tablet Take 1 tablet (40 mg total) by mouth daily.  Marland Kitchen augmented betamethasone dipropionate (DIPROLENE AF) 0.05 % cream Apply topically 2 (two) times daily.  . Blood Glucose Calibration (TRUE METRIX LEVEL 1) Low SOLN   . Blood Glucose Monitoring Suppl (ACCU-CHEK NANO SMARTVIEW) w/Device KIT   . ciprofloxacin (CIPRO) 500 MG tablet Take 0.5 tablets (250 mg total) by mouth 2 (two) times daily for 7 days.  Marland Kitchen docusate sodium (COLACE) 100 MG capsule Take 1 capsule (100 mg total) by mouth 2 (two) times daily.  . finasteride (PROSCAR) 5 MG tablet TAKE 1 TABLET EVERY DAY  . fluticasone (FLONASE) 50 MCG/ACT nasal spray Place 2 sprays into both nostrils daily as needed.   Marland Kitchen glipiZIDE (GLUCOTROL) 10 MG tablet Take 1 tablet (10 mg total) by mouth 2 (two) times daily before a meal.  . glucose blood test strip 1 each by Other route as needed for other. Use as instructed  . insulin glargine (LANTUS) 100 UNIT/ML injection Inject 30 Units into the skin every evening. 1800  . lisinopril (PRINIVIL,ZESTRIL) 20 MG tablet Take 1 tablet (20 mg total) by mouth daily.  Marland Kitchen lisinopril-hydrochlorothiazide (PRINZIDE,ZESTORETIC) 20-12.5 MG tablet TAKE 1 TABLET EVERY DAY  . meloxicam (MOBIC) 15 MG tablet Take 15 mg by mouth daily.  . metFORMIN (GLUCOPHAGE) 500 MG tablet Take 1 tablet (500 mg total) by mouth  2 (two) times daily with a meal.  . nystatin-triamcinolone ointment (MYCOLOG) Apply 1 application topically 2 (two) times daily.  Marland Kitchen omeprazole (PRILOSEC) 20 MG capsule Take 1 capsule (20 mg total) by mouth daily.  . Semaglutide (OZEMPIC) 0.25 or 0.5 MG/DOSE SOPN Inject 0.5 mg into the skin once a week.  . nitrofurantoin, macrocrystal-monohydrate, (MACROBID) 100 MG capsule Take 1 capsule (100 mg total) by mouth every 12 (twelve) hours.  . tamsulosin (FLOMAX) 0.4 MG CAPS capsule TAKE 1 CAPSULE EVERY DAY (Patient not taking: Reported on 11/19/2017)  . [DISCONTINUED] finasteride (PROSCAR) 5  MG tablet Take 1 tablet (5 mg total) by mouth daily.  . [DISCONTINUED] glipiZIDE (GLUCOTROL) 5 MG tablet Take 1 tablet (5 mg total) by mouth 2 (two) times daily.  . [DISCONTINUED] lisinopril (PRINIVIL,ZESTRIL) 20 MG tablet Take 1 tablet (20 mg total) by mouth daily.  . [DISCONTINUED] tamsulosin (FLOMAX) 0.4 MG CAPS capsule Take 1 capsule (0.4 mg total) by mouth daily.   No facility-administered encounter medications on file as of 11/19/2017.     Activities of Daily Living In your present state of health, do you have any difficulty performing the following activities: 11/19/2017 08/02/2017  Hearing? N -  Vision? N -  Difficulty concentrating or making decisions? N -  Walking or climbing stairs? N -  Dressing or bathing? N -  Doing errands, shopping? N N  Preparing Food and eating ? N -  Using the Toilet? N -  In the past six months, have you accidently leaked urine? N -  Do you have problems with loss of bowel control? N -  Managing your Medications? N -  Managing your Finances? N -  Housekeeping or managing your Housekeeping? N -  Some recent data might be hidden    Patient Care Team: Kathrine Haddock, NP as PCP - General (Nurse Practitioner) Hollice Espy, MD as Consulting Physician (Urology)   Assessment:   This is a routine wellness examination for Gwyndolyn Saxon.  Exercise Activities and Dietary  recommendations Current Exercise Habits: The patient has a physically strenous job, but has no regular exercise apart from work., Type of exercise: walking, Time (Minutes): 60, Frequency (Times/Week): 3, Weekly Exercise (Minutes/Week): 180, Intensity: Mild, Exercise limited by: None identified  Goals    . DIET - INCREASE WATER INTAKE     Recommend drinking at least 6-8 glasses of water a day        Fall Risk Fall Risk  11/19/2017 11/18/2016 06/02/2016 06/12/2015 05/29/2015  Falls in the past year? No Yes Yes No No  Number falls in past yr: - 1 1 - -  Injury with Fall? - Yes Yes - -  Comment - - broken wrist - -  Follow up - Falls prevention discussed - - -   Is the patient's home free of loose throw rugs in walkways, pet beds, electrical cords, etc?   yes      Grab bars in the bathroom? no      Handrails on the stairs?   yes      Adequate lighting?   yes  Timed Get Up and Go Performed: Completed in 8 seconds with no use of assistive devices, steady gait. No intervention needed at this time.   Depression Screen PHQ 2/9 Scores 11/19/2017 11/18/2016 11/18/2016 06/02/2016  PHQ - 2 Score 0 0 0 0  PHQ- 9 Score - 0 - -    Cognitive Function     6CIT Screen 11/19/2017 11/18/2016  What Year? 0 points 0 points  What month? 0 points 0 points  What time? 0 points 0 points  Count back from 20 0 points 0 points  Months in reverse 0 points 0 points  Repeat phrase 2 points 4 points  Total Score 2 4    Immunization History  Administered Date(s) Administered  . Influenza, High Dose Seasonal PF 03/03/2016, 03/08/2017  . Influenza,inj,Quad PF,6+ Mos 05/15/2015  . Pneumococcal Conjugate-13 10/16/2013, 04/15/2017  . Pneumococcal Polysaccharide-23 12/06/2012  . Tdap 11/23/2013    Qualifies for Shingles Vaccine? Yes, discussed shingrix vaccine   Screening Tests Health  Maintenance  Topic Date Due  . INFLUENZA VACCINE  01/13/2018  . HEMOGLOBIN A1C  03/13/2018  . FOOT EXAM  07/16/2018  .  OPHTHALMOLOGY EXAM  09/28/2018  . TETANUS/TDAP  11/24/2023  . PNA vac Low Risk Adult  Completed   Cancer Screenings: Lung: Low Dose CT Chest recommended if Age 62-80 years, 30 pack-year currently smoking OR have quit w/in 15years. Patient does not qualify. Colorectal: not indicated   Additional Screenings:  Hepatitis C Screening: not indicated       Plan:    I have personally reviewed and addressed the Medicare Annual Wellness questionnaire and have noted the following in the patient's chart:  A. Medical and social history B. Use of alcohol, tobacco or illicit drugs  C. Current medications and supplements D. Functional ability and status E.  Nutritional status F.  Physical activity G. Advance directives H. List of other physicians I.  Hospitalizations, surgeries, and ER visits in previous 12 months J.  [place] such as hearing and vision if needed, cognitive and depression L. Referrals and appointments   In addition, I have reviewed and discussed with patient certain preventive protocols, quality metrics, and best practice recommendations. A written personalized care plan for preventive services as well as general preventive health recommendations were provided to patient.   Signed,  Tyler Aas, LPN Nurse Health Advisor   Nurse Notes:none

## 2017-11-19 NOTE — Telephone Encounter (Signed)
Spoke with patient and instructed him to try a OTC probiotic or Imodium. He should continue antibiotic to finish the course. Patient verbalized understanding

## 2017-11-24 ENCOUNTER — Other Ambulatory Visit: Payer: Self-pay | Admitting: Unknown Physician Specialty

## 2017-12-02 ENCOUNTER — Ambulatory Visit: Payer: Medicare HMO | Admitting: Urology

## 2017-12-06 ENCOUNTER — Other Ambulatory Visit: Payer: Medicare HMO

## 2017-12-06 ENCOUNTER — Other Ambulatory Visit: Payer: Self-pay

## 2017-12-06 DIAGNOSIS — C61 Malignant neoplasm of prostate: Secondary | ICD-10-CM | POA: Diagnosis not present

## 2017-12-07 LAB — PSA: Prostate Specific Ag, Serum: 1.6 ng/mL (ref 0.0–4.0)

## 2017-12-08 ENCOUNTER — Other Ambulatory Visit: Payer: Self-pay

## 2017-12-08 ENCOUNTER — Ambulatory Visit: Payer: Medicare HMO | Admitting: Urology

## 2017-12-08 ENCOUNTER — Encounter: Payer: Self-pay | Admitting: Urology

## 2017-12-08 ENCOUNTER — Emergency Department
Admission: EM | Admit: 2017-12-08 | Discharge: 2017-12-08 | Disposition: A | Discharge status: Home / Self Care | Payer: Medicare HMO | Attending: Emergency Medicine | Admitting: Emergency Medicine

## 2017-12-08 ENCOUNTER — Ambulatory Visit: Payer: Self-pay | Admitting: *Deleted

## 2017-12-08 VITALS — BP 115/71 | HR 80 | Ht 69.0 in | Wt 224.0 lb

## 2017-12-08 DIAGNOSIS — Z79899 Other long term (current) drug therapy: Secondary | ICD-10-CM | POA: Insufficient documentation

## 2017-12-08 DIAGNOSIS — Z794 Long term (current) use of insulin: Secondary | ICD-10-CM | POA: Insufficient documentation

## 2017-12-08 DIAGNOSIS — N401 Enlarged prostate with lower urinary tract symptoms: Secondary | ICD-10-CM

## 2017-12-08 DIAGNOSIS — I129 Hypertensive chronic kidney disease with stage 1 through stage 4 chronic kidney disease, or unspecified chronic kidney disease: Secondary | ICD-10-CM | POA: Insufficient documentation

## 2017-12-08 DIAGNOSIS — C61 Malignant neoplasm of prostate: Secondary | ICD-10-CM

## 2017-12-08 DIAGNOSIS — N390 Urinary tract infection, site not specified: Secondary | ICD-10-CM | POA: Diagnosis not present

## 2017-12-08 DIAGNOSIS — Z7982 Long term (current) use of aspirin: Secondary | ICD-10-CM | POA: Diagnosis not present

## 2017-12-08 DIAGNOSIS — E1122 Type 2 diabetes mellitus with diabetic chronic kidney disease: Secondary | ICD-10-CM | POA: Diagnosis not present

## 2017-12-08 DIAGNOSIS — N138 Other obstructive and reflux uropathy: Secondary | ICD-10-CM | POA: Diagnosis not present

## 2017-12-08 DIAGNOSIS — N182 Chronic kidney disease, stage 2 (mild): Secondary | ICD-10-CM | POA: Diagnosis not present

## 2017-12-08 DIAGNOSIS — R8271 Bacteriuria: Secondary | ICD-10-CM | POA: Diagnosis not present

## 2017-12-08 DIAGNOSIS — E860 Dehydration: Secondary | ICD-10-CM

## 2017-12-08 DIAGNOSIS — R42 Dizziness and giddiness: Secondary | ICD-10-CM | POA: Diagnosis not present

## 2017-12-08 LAB — BASIC METABOLIC PANEL
ANION GAP: 10 (ref 5–15)
BUN: 28 mg/dL — ABNORMAL HIGH (ref 8–23)
CALCIUM: 8.9 mg/dL (ref 8.9–10.3)
CO2: 20 mmol/L — ABNORMAL LOW (ref 22–32)
Chloride: 106 mmol/L (ref 98–111)
Creatinine, Ser: 2.08 mg/dL — ABNORMAL HIGH (ref 0.61–1.24)
GFR calc non Af Amer: 29 mL/min — ABNORMAL LOW (ref >60)
GFR, EST AFRICAN AMERICAN: 34 mL/min — AB (ref >60)
Glucose, Bld: 262 mg/dL — ABNORMAL HIGH (ref 70–99)
Potassium: 5.1 mmol/L (ref 3.5–5.1)
Sodium: 136 mmol/L (ref 135–145)

## 2017-12-08 LAB — URINALYSIS, COMPLETE (UACMP) WITH MICROSCOPIC
Bilirubin Urine: NEGATIVE
Glucose, UA: 50 mg/dL — AB
KETONES UR: NEGATIVE mg/dL
Nitrite: NEGATIVE
PH: 5 (ref 5.0–8.0)
Protein, ur: 30 mg/dL — AB
SPECIFIC GRAVITY, URINE: 1.02 (ref 1.005–1.030)
WBC, UA: >50 WBC/hpf — ABNORMAL HIGH (ref 0–5)

## 2017-12-08 LAB — CBC
HCT: 39.5 % — ABNORMAL LOW (ref 40.0–52.0)
HEMOGLOBIN: 13.6 g/dL (ref 13.0–18.0)
MCH: 31.9 pg (ref 26.0–34.0)
MCHC: 34.3 g/dL (ref 32.0–36.0)
MCV: 93 fL (ref 80.0–100.0)
Platelets: 230 10*3/uL (ref 150–440)
RBC: 4.25 MIL/uL — AB (ref 4.40–5.90)
RDW: 14.2 % (ref 11.5–14.5)
WBC: 8.2 10*3/uL (ref 3.8–10.6)

## 2017-12-08 LAB — TROPONIN I: TROPONIN I: 0.03 ng/mL — AB (ref <0.03)

## 2017-12-08 MED ORDER — CEPHALEXIN 500 MG PO CAPS: 500.0000 mg | ORAL_CAPSULE | Freq: Three times a day (TID) | ORAL | 0 refills | Status: DC

## 2017-12-08 MED ORDER — SODIUM CHLORIDE 0.9 % IV SOLN
1.0000 g | Freq: Once | INTRAVENOUS | Status: AC
  Administered 2017-12-08: 1 g via INTRAVENOUS
  Filled 2017-12-08: qty 10

## 2017-12-08 MED ORDER — SODIUM CHLORIDE 0.9 % IV BOLUS
1000.0000 mL | Freq: Once | INTRAVENOUS | Status: AC
  Administered 2017-12-08: 1000 mL via INTRAVENOUS

## 2017-12-08 NOTE — ED Notes (Signed)
Spoke about pt presentation with pt, no head CT at this time.

## 2017-12-08 NOTE — ED Triage Notes (Signed)
Pt arrives to ED c/o of bilat blurred vision x 2 hours. States BP dropped into 70's about an hour ago. States frontal HA. Speech normal per family member. Denies double vision, denies losing vision. C/o dizziness.

## 2017-12-08 NOTE — Progress Notes (Signed)
12/08/2017 4:44 PM   Scott Bruins Reach Jr. 18-May-1941 [id number]  Referring provider: Kathrine Haddock, NP [address], [place] [address]  Chief Complaint  Patient presents with  . Benign Prostatic Hypertrophy    HPI: 77 yo M with history of BPH with bladder outlet obstruction, history of urinary retention status post TURP 08/02/2017.  Surgical pathology does show incidental prostate cancer involving 1% of the tissue submitted.  Histology favors Gleason 3+4 but this was limited due to cautery artifact.  There is also evidence of mixed acute and chronic inflammation.  A total of 11 g was resected.  He is now voiding spontaneously and overall pleased with his urinary symptoms.  He feels like he is emptying his bladder completely.  We have checked PVRs in several previous occasions which have been in the proximal 120 cc range.  He was also cathing for residuals which were around 100 cc.  He now no longer cath.  He believes he is no longer taking any BPH medications including no finasteride or Flomax at this point time.  These were removed from his medication list today.  He was also treated for urinary tract infection on his last visit on 11/10/2017 by Zara Council.  He ultimately grew Serratia.  Today, he denies any burning, pain, fevers, or any other UTI symptoms.  His UA is somewhat suspicious but is otherwise asymptomatic.  Most recent PSA stable on 12/06/2017.  PMH: Past Medical History:  Diagnosis Date  . BPH (benign prostatic hyperplasia)   . Diabetes mellitus without complication ([place])   . Elevated PSA   . GERD (gastroesophageal reflux disease)   . Hypertension   . Hypertriglyceridemia   . Nocturia     Surgical History: Past Surgical History:  Procedure Laterality Date  . APPENDECTOMY    . CARPAL TUNNEL RELEASE Left 12/11/2015   Procedure: CARPAL TUNNEL RELEASE;  Surgeon: Earnestine Leys, MD;  Location: [facility] ORS;  Service: Orthopedics;  Laterality: Left;  . EYE SURGERY      laser  . TRANSURETHRAL RESECTION OF PROSTATE N/A 08/02/2017   Procedure: TRANSURETHRAL RESECTION OF THE PROSTATE (TURP);  Surgeon: Hollice Espy, MD;  Location: [facility] ORS;  Service: Urology;  Laterality: N/A;    Home Medications:  Allergies as of 12/08/2017      Reactions   Gabapentin Other (See Comments)   "made me fall out"      Medication List        Accurate as of 12/08/17  4:44 PM. Always use your most recent med list.          ACCU-CHEK FASTCLIX LANCETS Misc Use to check blood sugar twice daily   ACCU-CHEK NANO SMARTVIEW w/Device Kit   aspirin 81 MG tablet Take 81 mg by mouth daily.   atorvastatin 40 MG tablet Commonly known as:  LIPITOR TAKE 1 TABLET (40 MG TOTAL) BY MOUTH DAILY.   augmented betamethasone dipropionate 0.05 % cream Commonly known as:  DIPROLENE AF Apply topically 2 (two) times daily.   B-D SINGLE USE SWABS REGULAR Pads   docusate sodium 100 MG capsule Commonly known as:  COLACE Take 1 capsule (100 mg total) by mouth 2 (two) times daily.   fluticasone 50 MCG/ACT nasal spray Commonly known as:  FLONASE Place 2 sprays into both nostrils daily as needed.   glipiZIDE 10 MG tablet Commonly known as:  GLUCOTROL Take 1 tablet (10 mg total) by mouth 2 (two) times daily before a meal.   glucose blood test strip 1  each by Other route as needed for other. Use as instructed   insulin glargine 100 UNIT/ML injection Commonly known as:  LANTUS Inject 30 Units into the skin every evening. 1800   lisinopril 20 MG tablet Commonly known as:  PRINIVIL,ZESTRIL Take 1 tablet (20 mg total) by mouth daily.   lisinopril-hydrochlorothiazide 20-12.5 MG tablet Commonly known as:  PRINZIDE,ZESTORETIC TAKE 1 TABLET EVERY DAY   meloxicam 15 MG tablet Commonly known as:  MOBIC Take 15 mg by mouth daily.   metFORMIN 500 MG tablet Commonly known as:  GLUCOPHAGE TAKE 1 TABLET (500 MG TOTAL) BY MOUTH 2 (TWO) TIMES DAILY WITH A MEAL.   nitrofurantoin  (macrocrystal-monohydrate) 100 MG capsule Commonly known as:  MACROBID Take 1 capsule (100 mg total) by mouth every 12 (twelve) hours.   nystatin-triamcinolone ointment Commonly known as:  MYCOLOG Apply 1 application topically 2 (two) times daily.   omeprazole 20 MG capsule Commonly known as:  PRILOSEC Take 1 capsule (20 mg total) by mouth daily.   Semaglutide 0.25 or 0.5 MG/DOSE Sopn Commonly known as:  OZEMPIC Inject 0.5 mg into the skin once a week.   TRUE METRIX LEVEL 1 Low Soln       Allergies:  Allergies  Allergen Reactions  . Gabapentin Other (See Comments)    "made me fall out"    Family History: Family History  Problem Relation Age of Onset  . Cancer Mother        lung  . Heart disease Father   . Diabetes Brother   . Prostate cancer Neg Hx   . Bladder Cancer Neg Hx   . Kidney cancer Neg Hx     Social History:  reports that he has never smoked. He has never used smokeless tobacco. He reports that he does not drink alcohol or use drugs.  ROS: UROLOGY Frequent Urination?: No Hard to postpone urination?: No Burning/pain with urination?: No Get up at night to urinate?: No Leakage of urine?: No Urine stream starts and stops?: No Trouble starting stream?: No Do you have to strain to urinate?: No Blood in urine?: No Urinary tract infection?: No Sexually transmitted disease?: No Injury to kidneys or bladder?: No Painful intercourse?: No Weak stream?: No Erection problems?: No Penile pain?: No  Gastrointestinal Nausea?: No Vomiting?: No Indigestion/heartburn?: No Diarrhea?: No Constipation?: No  Constitutional Fever: No Night sweats?: No Weight loss?: No Fatigue?: No  Skin Skin rash/lesions?: No Itching?: No  Eyes Blurred vision?: No Double vision?: No  Ears/Nose/Throat Sore throat?: No Sinus problems?: No  Hematologic/Lymphatic Swollen glands?: No Easy bruising?: No  Cardiovascular Leg swelling?: No Chest pain?:  No  Respiratory Cough?: No Shortness of breath?: No  Endocrine Excessive thirst?: No  Musculoskeletal Back pain?: No Joint pain?: No  Neurological Headaches?: No Dizziness?: No  Psychologic Depression?: No Anxiety?: No  Physical Exam: BP 115/71   Pulse 80   Ht 5' 9" (1.753 m)   Wt 224 lb (101.6 kg)   BMI 33.08 kg/m   Constitutional:  Alert and oriented, No acute distress. HEENT:  AT, moist mucus membranes.  Trachea midline, no masses. Cardiovascular: No clubbing, cyanosis, or edema. Respiratory: Normal respiratory effort, no increased work of breathing. Skin: No rashes, bruises or suspicious lesions. Neurologic: Grossly intact, no focal deficits, moving all 4 extremities. Psychiatric: Normal mood and affect.  Laboratory Data: Lab Results  Component Value Date   WBC 8.2 12/08/2017   HGB 13.6 12/08/2017   HCT 39.5 (L) 12/08/2017   MCV 93.0  12/08/2017   PLT 230 12/08/2017    Lab Results  Component Value Date   CREATININE 2.08 (H) 12/08/2017    Lab Results  Component Value Date   HGBA1C 7.1% 03/03/2016    Urinalysis See epic, UA reviewed with greater than 30 white blood cells per high-powered field.  Pertinent Imaging: n/a  Assessment & Plan:    1. Prostate cancer [facility]) We discussed the incidental finding of minute portion of probable Gleason 3 + 4 with intravesical stent PSA remains stable and low which is reassuring Patient is extremely concerned about his cancer diagnosis and would like more information about whether or not he should pursue intervention Discussed options including proceeding with  traditional transrectal ultrasound biopsy versus endorectal MRI to assess for his overall disease burden. He is more interested in MRI which was ordered today, he will follow-up after this is completed - Urinalysis - CULTURE, URINE COMPREHENSIVE - MR PROSTATE W WO CONTRAST; Future  2. Benign prostatic hyperplasia with urinary obstruction Status  post TURP Currently asymptomatic, doing well no longer in retention Continue to hold finasteride and Flomax  3. 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  Hollice Espy, MD  [facility] [address] [place], [place] [address] [phone]  I spent 25 min with this patient of which greater than 50% was spent in counseling and coordination of care with the patient.

## 2017-12-08 NOTE — ED Notes (Signed)
Date and time results received: 12/08/17 6:49 PM  (use smartphrase ".now" to insert current time)  Test: Trop Critical Value: .03  Name of Provider Notified: Dr. Archie Balboa  Orders Received? Or Actions Taken?: Critical Results acknowledged

## 2017-12-08 NOTE — Telephone Encounter (Signed)
Pt having some low b/p readings , 82/46, 76/60, 64/44 and lastly 66/54. He states that he is a little dizzy. He took his b/p med (lisinopril -hydrochlorothiazide 20-12.5 mg) this morning like he always. He feels like he is not dehydrated, drinking lots of water. Advised pt to go to the emergency department at nearest hospital to be evaluated. He stated that he would call his daughter to take him there. Will call back in 30 mins to make sure his daughter is there to take him to the hospital. Will route to [facility].  Reason for Disposition . [9] Fall in systolic BP > 20 mm Hg from normal AND [2] dizzy, lightheaded, or weak  Answer Assessment - Initial Assessment Questions 1. BLOOD PRESSURE: "What is the blood pressure?" "Did you take at least two measurements 5 minutes apart?"     64/44 at 230 pm, 66/54 HR 101 at 302 pm 2. ONSET: "When did you take your blood pressure?"     Just now 3. HOW: "How did you obtain the blood pressure?" (e.g., visiting nurse, automatic home BP monitor)     Automatic home BP monitor 4. HISTORY: "Do you have a history of low blood pressure?" "What is your blood pressure normally?"     no 5. MEDICATIONS: "Are you taking any medications for blood pressure?" If yes: "Have they been changed recently?"     Yes took meds 6. PULSE RATE: "Do you know what your pulse rate is?"      101 7. OTHER SYMPTOMS: "Have you been sick recently?" "Have you had a recent injury?"     no  Protocols used: LOW BLOOD PRESSURE-A-AH

## 2017-12-08 NOTE — Discharge Instructions (Addendum)
As we discussed please talk to your primary care doctor about having your kidney function rechecked. Please seek medical attention for any high fevers, chest pain, shortness of breath, change in behavior, persistent vomiting, bloody stool or any other new or concerning symptoms.

## 2017-12-08 NOTE — ED Provider Notes (Signed)
[facility] Emergency Department Provider Note  ____________________________________________   I have reviewed the triage vital signs and the nursing notes.   HISTORY  Chief Complaint Dizziness and Hypotension   History limited by: Not Limited   HPI Scott Lawson. is a 77 y.o. male who presents to the emergency department today because of concerns for episode of dizziness and low blood pressure.  Patient states that he had been feeling fine earlier in the day.  He went to a urology appointment for follow-up.  While driving home he started becoming lightheaded.  Felt like he was getting blurry vision.  When he got home he checked his blood pressure and was found to be quite low.  He denies any associated chest pain or palpitations.  The hypotension persisted so he came to the emergency department.  By the time my exam he states he is feeling better after fluids.  Apparently this is happened to him once before and he was told that he was dehydrated. He denies any recent fevers or infections.   Per medical record review patient has a history of CKD  Past Medical History:  Diagnosis Date  . BPH (benign prostatic hyperplasia)   . Diabetes mellitus without complication ([place])   . Elevated PSA   . GERD (gastroesophageal reflux disease)   . Hypertension   . Hypertriglyceridemia   . Nocturia     Patient Active Problem List   Diagnosis Date Noted  . Psoriasis 08/16/2017  . BPH with urinary obstruction 08/02/2017  . Advanced care planning/counseling discussion 12/01/2016  . Nocturia 06/02/2016  . GERD (gastroesophageal reflux disease) 08/14/2015  . 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 Essential hypertension, benign 08/13/2015  . 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 Hyperlipidemia 02/11/2015  . BPH (benign prostatic hyperplasia) 02/11/2015  . Hypertensive CKD (chronic kidney disease)  02/11/2015    Past Surgical History:  Procedure Laterality Date  . APPENDECTOMY    . CARPAL TUNNEL RELEASE Left 12/11/2015   Procedure: CARPAL TUNNEL RELEASE;  Surgeon: Earnestine Leys, MD;  Location: [facility] ORS;  Service: Orthopedics;  Laterality: Left;  . EYE SURGERY     laser  . TRANSURETHRAL RESECTION OF PROSTATE N/A 08/02/2017   Procedure: TRANSURETHRAL RESECTION OF THE PROSTATE (TURP);  Surgeon: Hollice Espy, MD;  Location: [facility] ORS;  Service: Urology;  Laterality: N/A;    Prior to Admission medications   Medication Sig Start Date End Date Taking? Authorizing Provider  ACCU-CHEK FASTCLIX LANCETS MISC Use to check blood sugar twice daily 11/10/16   [provider]  Alcohol Swabs (B-D SINGLE USE SWABS REGULAR) PADS  11/10/16   [provider]  aspirin 81 MG tablet Take 81 mg by mouth daily.    [provider]  atorvastatin (LIPITOR) 40 MG tablet TAKE 1 TABLET (40 MG TOTAL) BY MOUTH DAILY. 11/25/17   Kathrine Haddock, NP  augmented betamethasone dipropionate (DIPROLENE AF) 0.05 % cream Apply topically 2 (two) times daily. 08/16/17   Kathrine Haddock, NP  Blood Glucose Calibration (TRUE METRIX LEVEL 1) Low SOLN  09/30/17   [provider]  Blood Glucose Monitoring Suppl (ACCU-CHEK NANO SMARTVIEW) w/Device KIT  11/10/16   [provider]  docusate sodium (COLACE) 100 MG capsule Take 1 capsule (100 mg total) by mouth 2 (two) times daily. 08/03/17   Hollice Espy, MD  fluticasone (FLONASE) 50 MCG/ACT nasal spray Place 2 sprays into both nostrils daily as needed.  [provider]  glipiZIDE (GLUCOTROL) 10 MG tablet Take 1 tablet (10 mg total) by mouth 2 (two) times daily before a meal. 06/14/17   Kathrine Haddock, NP  glucose blood test strip 1 each by Other route as needed for other. Use as instructed    [provider]  insulin glargine (LANTUS) 100 UNIT/ML injection Inject 30 Units into the skin every evening. 1800    [provider]  lisinopril (PRINIVIL,ZESTRIL) 20 MG tablet Take 1 tablet (20 mg total) by mouth daily. 05/28/17   Kathrine Haddock, NP  lisinopril-hydrochlorothiazide (PRINZIDE,ZESTORETIC) 20-12.5 MG tablet TAKE 1 TABLET EVERY DAY 09/24/17   Kathrine Haddock, NP  meloxicam (MOBIC) 15 MG tablet Take 15 mg by mouth daily. 07/29/16   [provider]  metFORMIN (GLUCOPHAGE) 500 MG tablet TAKE 1 TABLET (500 MG TOTAL) BY MOUTH 2 (TWO) TIMES DAILY WITH A MEAL. 11/25/17   Kathrine Haddock, NP  nitrofurantoin, macrocrystal-monohydrate, (MACROBID) 100 MG capsule Take 1 capsule (100 mg total) by mouth every 12 (twelve) hours. 11/10/17   Zara Council A, PA-C  nystatin-triamcinolone ointment (MYCOLOG) Apply 1 application topically 2 (two) times daily. 11/10/17   Zara Council A, PA-C  omeprazole (PRILOSEC) 20 MG capsule Take 1 capsule (20 mg total) by mouth daily. 07/29/16   Kathrine Haddock, NP  Semaglutide (OZEMPIC) 0.25 or 0.5 MG/DOSE SOPN Inject 0.5 mg into the skin once a week. 10/12/17   Kathrine Haddock, NP    Allergies Gabapentin  Family History  Problem Relation Age of Onset  . Cancer Mother        lung  . Heart disease Father   . Diabetes Brother   . Prostate cancer Neg Hx   . Bladder Cancer Neg Hx   . Kidney cancer Neg Hx     Social History Social History   Tobacco Use  . Smoking status: Never Smoker  . Smokeless tobacco: Never Used  Substance Use Topics  . Alcohol use: No    Alcohol/week: 0.0 oz  . Drug use: No    Review of Systems Constitutional: No fever/chills Eyes: Positive for blurry vision ENT: No sore throat. Cardiovascular: Denies chest pain. Respiratory: Denies shortness of breath. Gastrointestinal: No abdominal pain.  No nausea, no vomiting.  No diarrhea.   Genitourinary: Negative for dysuria. Musculoskeletal: Negative for back pain. Skin: Negative for rash. Neurological: Positive for lightheadedness.  ____________________________________________   PHYSICAL  EXAM:  VITAL SIGNS: ED Triage Vitals  Enc Vitals Group     BP 12/08/17 1546 (!) 100/49     Pulse Rate 12/08/17 1546 83     Resp 12/08/17 1546 18     Temp 12/08/17 1546 97.9 F (36.6 C)     Temp Source 12/08/17 1546 Oral     SpO2 12/08/17 1546 96 %     Weight 12/08/17 1547 224 lb (101.6 kg)     Height 12/08/17 1547 5' 9"  (1.753 m)     Head Circumference --      Peak Flow --      Pain Score 12/08/17 1546 3   Constitutional: Alert and oriented.  Eyes: Conjunctivae are normal.  ENT      Head: Normocephalic and atraumatic.      Nose: No congestion/rhinnorhea.      Mouth/Throat: Mucous membranes are moist.      Neck: No stridor. Hematological/Lymphatic/Immunilogical: No cervical lymphadenopathy. Cardiovascular: Normal rate, regular rhythm.  No murmurs, rubs, or gallops. Respiratory: Normal respiratory effort without tachypnea nor retractions. Breath sounds are  clear and equal bilaterally. No wheezes/rales/rhonchi. Gastrointestinal: Soft and non tender. No rebound. No guarding.  Genitourinary: Deferred Musculoskeletal: Normal range of motion in all extremities. No lower extremity edema. Neurologic:  Normal speech and language. No gross focal neurologic deficits are appreciated.  Skin:  Skin is warm, dry and intact. No rash noted. Psychiatric: Mood and affect are normal. Speech and behavior are normal. Patient exhibits appropriate insight and judgment.  ____________________________________________    LABS (pertinent positives/negatives)  CBC wbc 8.2, hgb 13.6, plt 230 BMP na 136, k 5.1, glu 262, cr 2.08 Trop 0.03 -> <0.03 UA cloudy, moderate leukocytes, >50 wbcs ____________________________________________   EKG  I, Nance Pear, attending physician, personally viewed and interpreted this EKG  EKG Time: 1551 Rate: 88 Rhythm: normal sinus rhythm Axis: left axis deviation Intervals: qtc 471 QRS: LAFB, LVH ST changes: no st elevation Impression: abnormal  ekg   ____________________________________________    RADIOLOGY  None  ____________________________________________   PROCEDURES  Procedures  ____________________________________________   INITIAL IMPRESSION / ASSESSMENT AND PLAN / ED COURSE  Pertinent labs & imaging results that were available during my care of the patient were reviewed by me and considered in my medical decision making (see chart for details).   Patient presented to the emergency department today after an episode of lightheadedness and low blood pressure readings.  Patient's blood pressure here has been okay.  Differential would be broad including infection, dehydration, vasovagal amongst other etiologies.  Patient's work-up is concerning for UTI however no leukocytosis.  Creatinine is a little bit more elevated than baseline.  Patient was given fluids and did feel better.  Additionally patient was given dose of IV antibiotics here.  Discussed with patient that I would like to get his creatinine rechecked by his primary care doctor in the next few days.   ____________________________________________   FINAL CLINICAL IMPRESSION(S) / ED DIAGNOSES  Final diagnoses:  Lightheadedness  Lower urinary tract infectious disease  Dehydration     Note: This dictation was prepared with Dragon dictation. Any transcriptional errors that result from this process are unintentional     Nance Pear, MD 12/08/17 2231

## 2017-12-08 NOTE — Telephone Encounter (Signed)
Called patient back to make sure he is on the way to the ED. No answer.

## 2017-12-08 NOTE — ED Notes (Signed)
Pt states was driving home today when he had sudden onset "bright white light" in his vision. Pt states has home glucometer and home BP machine. Pt reports CBG normal at home, however reports home BP in the 70's. Pt states called PCP and was referred to the ED. Pt is alert and oriented on arrival to ED.

## 2017-12-09 ENCOUNTER — Telehealth: Payer: Self-pay | Admitting: Urology

## 2017-12-09 LAB — URINALYSIS
BILIRUBIN UA: NEGATIVE
KETONES UA: NEGATIVE
NITRITE UA: NEGATIVE
Specific Gravity, UA: >1.03 — ABNORMAL HIGH (ref 1.005–1.030)
UUROB: 0.2 mg/dL (ref 0.2–1.0)
pH, UA: 5.5 (ref 5.0–7.5)

## 2017-12-09 NOTE — Telephone Encounter (Signed)
Pt was in ER last night.  He went straight there from our office and had infection in his kidney.  The ER dr told him the infection was not gone.  He wanted [place] to know the meds we gave him didn't work.  He said 3 strikes and we're out.

## 2017-12-11 LAB — CULTURE, URINE COMPREHENSIVE

## 2017-12-11 LAB — URINE CULTURE: Culture: 40000 — AB

## 2017-12-12 NOTE — ED Provider Notes (Signed)
Discussed with pharmacist, Cyril Mourning.  We reviewed culture data, last ED visit, last 2 urine cultures and he continues to show Serratia marcescens that appears to be susceptible to ciprofloxacin.  Discussed with pharmacist, Cyril Mourning will call and plan to change patient to twice daily Cipro dosing for 7 days.     Delman Kitten, MD 12/12/17 [phone]

## 2017-12-12 NOTE — Progress Notes (Signed)
ED Antimicrobial Stewardship Positive Culture Follow Up   Scott Lawson. is an 77 y.o. male who presented to [facility] on 12/08/2017 with a chief complaint of  Chief Complaint  Patient presents with  . Dizziness  . Hypotension    Recent Results (from the past 720 hour(s))  CULTURE, URINE COMPREHENSIVE     Status: Abnormal   Collection Time: 12/08/17  9:50 AM  Result Value Ref Range Status   Urine Culture, Comprehensive Final report (A)  Final   Organism ID, Bacteria Serratia marcescens (A)  Final    Comment: 25,000-50,000 colony forming units per mL   ANTIMICROBIAL SUSCEPTIBILITY Comment  Final    Comment:       ** S = Susceptible; I = Intermediate; R = Resistant **                    P = Positive; N = Negative             MICS are expressed in micrograms per mL    Antibiotic                 RSLT#1    RSLT#2    RSLT#3    RSLT#4 Amoxicillin/Clavulanic Acid    R Cefazolin                      R Cefepime                       S Ceftriaxone                    S Cefuroxime                     R Ciprofloxacin                  S Ertapenem                      S Gentamicin                     S Levofloxacin                   S Meropenem                      S Nitrofurantoin                 R Tetracycline                   R Tobramycin                     S Trimethoprim/Sulfa             S   Urine Culture     Status: Abnormal   Collection Time: 12/08/17  7:23 PM  Result Value Ref Range Status   Specimen Description   Final    URINE, RANDOM Performed at [facility], [address] [place], [place] [address]    Special Requests   Final    NONE Performed at [facility], [address] [place], [place] [address]    Culture 40,000 COLONIES/mL SERRATIA MARCESCENS (A)  Final   Report Status 12/11/2017 FINAL  Final   Organism ID, Bacteria SERRATIA MARCESCENS (A)  Final      Susceptibility   Serratia marcescens -  MIC*    CEFAZOLIN >=64 RESISTANT  Resistant     CEFTRIAXONE <=1 SENSITIVE Sensitive     CIPROFLOXACIN <=0.25 SENSITIVE Sensitive     GENTAMICIN <=1 SENSITIVE Sensitive     NITROFURANTOIN 256 RESISTANT Resistant     TRIMETH/SULFA <=20 SENSITIVE Sensitive     * 40,000 COLONIES/mL SERRATIA MARCESCENS    [x]  Treated with Cephalexin, organism resistant to prescribed antimicrobial  New antibiotic prescription: Ciprofloxacin 250 mg PO bid x 7 days  ED Provider: M.Quale  6/30: 1st attempt: Called patients home phone# listed in chart ([phone]) and cell # [phone]). Neither has voicemail set up apparently, therefore was unable to leave message.    Chinita Greenland PharmD Clinical Pharmacist 12/12/2017 2:54 PM

## 2017-12-12 NOTE — Progress Notes (Signed)
ED Antimicrobial Stewardship Positive Culture Follow Up   Scott Lawson. is an 77 y.o. male who presented to [facility] on 12/08/2017 with a chief complaint of  Chief Complaint  Patient presents with  . Dizziness  . Hypotension    Recent Results (from the past 720 hour(s))  CULTURE, URINE COMPREHENSIVE     Status: Abnormal   Collection Time: 12/08/17  9:50 AM  Result Value Ref Range Status   Urine Culture, Comprehensive Final report (A)  Final   Organism ID, Bacteria Serratia marcescens (A)  Final    Comment: 25,000-50,000 colony forming units per mL   ANTIMICROBIAL SUSCEPTIBILITY Comment  Final    Comment:       ** S = Susceptible; I = Intermediate; R = Resistant **                    P = Positive; N = Negative             MICS are expressed in micrograms per mL    Antibiotic                 RSLT#1    RSLT#2    RSLT#3    RSLT#4 Amoxicillin/Clavulanic Acid    R Cefazolin                      R Cefepime                       S Ceftriaxone                    S Cefuroxime                     R Ciprofloxacin                  S Ertapenem                      S Gentamicin                     S Levofloxacin                   S Meropenem                      S Nitrofurantoin                 R Tetracycline                   R Tobramycin                     S Trimethoprim/Sulfa             S   Urine Culture     Status: Abnormal   Collection Time: 12/08/17  7:23 PM  Result Value Ref Range Status   Specimen Description   Final    URINE, RANDOM Performed at [facility], [address] [place], [place] [address]    Special Requests   Final    NONE Performed at [facility], [address] [place], [place] [address]    Culture 40,000 COLONIES/mL SERRATIA MARCESCENS (A)  Final   Report Status 12/11/2017 FINAL  Final   Organism ID, Bacteria SERRATIA MARCESCENS (A)  Final      Susceptibility   Serratia marcescens -  MIC*    CEFAZOLIN >=64 RESISTANT  Resistant     CEFTRIAXONE <=1 SENSITIVE Sensitive     CIPROFLOXACIN <=0.25 SENSITIVE Sensitive     GENTAMICIN <=1 SENSITIVE Sensitive     NITROFURANTOIN 256 RESISTANT Resistant     TRIMETH/SULFA <=20 SENSITIVE Sensitive     * 40,000 COLONIES/mL SERRATIA MARCESCENS    [x]  Treated with Cephalexin, organism resistant to prescribed antimicrobial  New antibiotic prescription: Ciprofloxacin 250 mg PO bid x 7 days  ED Provider: M.Quale  6/30: 1st attempt: Called patients home phone# listed in chart ([phone]) and cell # ([phone])~ 1500 and ~1525. Neither has voicemail set up apparently, therefore was unable to leave message.    Chinita Greenland PharmD Clinical Pharmacist 12/12/2017 3:23 PM

## 2017-12-13 NOTE — Telephone Encounter (Signed)
I am actually somewhat confused about the series of messages.    This patient came into the office on the 26th and was having NO burning or significant urinary complaints.  His UA was suspicious.  We discussed that since he had no symptoms, we were not going to treat and that he was probably colonized.  As such, no antibiotics were given.  He was advised to call if he developed any urinary symptoms.  Looks like he presented later that same day to the emergency room with dizziness.  Work-up was unremarkable other than for dehydration.  He was given fluids.  His UA appeared as the same as in our clinic.  Again, he was asymptomatic.  The bottom line is that if he is having burning when he urinates or is feeling unwell, we can treat him with Cipro.  If not, no need to treat.  Same as discussed in the clinic.    Please continue to try to reach out to the patient.  Hollice Espy, MD

## 2017-12-13 NOTE — Progress Notes (Signed)
ED Antimicrobial Stewardship Positive Culture Follow Up   Scott Lawson. is an 77 y.o. male who presented to [facility] on 12/08/2017 with a chief complaint of  Chief Complaint  Patient presents with  . Dizziness  . Hypotension    Recent Results (from the past 720 hour(s))  CULTURE, URINE COMPREHENSIVE     Status: Abnormal   Collection Time: 12/08/17  9:50 AM  Result Value Ref Range Status   Urine Culture, Comprehensive Final report (A)  Final   Organism ID, Bacteria Serratia marcescens (A)  Final    Comment: 25,000-50,000 colony forming units per mL   ANTIMICROBIAL SUSCEPTIBILITY Comment  Final    Comment:       ** S = Susceptible; I = Intermediate; R = Resistant **                    P = Positive; N = Negative             MICS are expressed in micrograms per mL    Antibiotic                 RSLT#1    RSLT#2    RSLT#3    RSLT#4 Amoxicillin/Clavulanic Acid    R Cefazolin                      R Cefepime                       S Ceftriaxone                    S Cefuroxime                     R Ciprofloxacin                  S Ertapenem                      S Gentamicin                     S Levofloxacin                   S Meropenem                      S Nitrofurantoin                 R Tetracycline                   R Tobramycin                     S Trimethoprim/Sulfa             S   Urine Culture     Status: Abnormal   Collection Time: 12/08/17  7:23 PM  Result Value Ref Range Status   Specimen Description   Final    URINE, RANDOM Performed at [facility], [address], [place], [place] [address]    Special Requests   Final    NONE Performed at [facility], [address] [place], [place] [address]    Culture 40,000 COLONIES/mL SERRATIA MARCESCENS (A)  Final   Report Status 12/11/2017 FINAL  Final   Organism ID, Bacteria SERRATIA MARCESCENS (A)  Final      Susceptibility   Serratia marcescens -  MIC*    CEFAZOLIN >=64 RESISTANT  Resistant     CEFTRIAXONE <=1 SENSITIVE Sensitive     CIPROFLOXACIN <=0.25 SENSITIVE Sensitive     GENTAMICIN <=1 SENSITIVE Sensitive     NITROFURANTOIN 256 RESISTANT Resistant     TRIMETH/SULFA <=20 SENSITIVE Sensitive     * 40,000 COLONIES/mL SERRATIA MARCESCENS    [x]  Treated with Cephalexin, organism resistant to prescribed antimicrobial Patient did receive Ceftriaxone 1 gram x1 in ER  New antibiotic prescription: Ciprofloxacin 250 mg PO bid x 7 days  ED Provider: M.Quale  6/30: 1st attempt: Called patients home phone# listed in chart ([phone]) and cell # [phone])~ 1500 and ~1525. Neither has voicemail set up apparently, therefore was unable to leave message.   7/1: 2nd attempt: called above phone numbers with no answer and no available voicemail @ 1250 and 1400. Called [facility] office and was given a daughters phone # as [phone] Graylon Gunning. Called this # and it has been disconnected. Informed nurse at [facility] about urine cx from ER.   Called [facility] (patient's PCP) and they have same phone numbers.    Chinita Greenland PharmD Clinical Pharmacist 12/13/2017 1:59 PM

## 2017-12-13 NOTE — Telephone Encounter (Signed)
Call received from Chinita Greenland in the [facility] ER pharmacy stating patient was recently seen in the ER & has a positive urine culture. She wants Dr Erlene Quan to be aware of +ucx & that she is going to prescribe Cipro but has been unable to reach the patient by phone to notify him as of today, 12/13/2017.

## 2017-12-14 ENCOUNTER — Ambulatory Visit (INDEPENDENT_AMBULATORY_CARE_PROVIDER_SITE_OTHER): Payer: Medicare HMO | Admitting: Unknown Physician Specialty

## 2017-12-14 ENCOUNTER — Encounter: Payer: Self-pay | Admitting: Unknown Physician Specialty

## 2017-12-14 ENCOUNTER — Other Ambulatory Visit: Payer: Self-pay

## 2017-12-14 VITALS — BP 116/73 | HR 67 | Temp 98.6°F | Ht 69.0 in | Wt 232.8 lb

## 2017-12-14 DIAGNOSIS — I10 Essential (primary) hypertension: Secondary | ICD-10-CM | POA: Diagnosis not present

## 2017-12-14 DIAGNOSIS — E782 Mixed hyperlipidemia: Secondary | ICD-10-CM

## 2017-12-14 DIAGNOSIS — E1165 Type 2 diabetes mellitus with hyperglycemia: Secondary | ICD-10-CM

## 2017-12-14 DIAGNOSIS — IMO0002 Reserved for concepts with insufficient information to code with codable children: Secondary | ICD-10-CM

## 2017-12-14 DIAGNOSIS — E1122 Type 2 diabetes mellitus with diabetic chronic kidney disease: Secondary | ICD-10-CM

## 2017-12-14 DIAGNOSIS — N3000 Acute cystitis without hematuria: Secondary | ICD-10-CM

## 2017-12-14 LAB — UA/M W/RFLX CULTURE, ROUTINE
BILIRUBIN UA: NEGATIVE
GLUCOSE, UA: NEGATIVE
Ketones, UA: NEGATIVE
Leukocytes, UA: NEGATIVE
NITRITE UA: NEGATIVE
PH UA: 5 (ref 5.0–7.5)
Protein, UA: NEGATIVE
RBC, UA: NEGATIVE
Specific Gravity, UA: <1.005 — ABNORMAL LOW (ref 1.005–1.030)
Urobilinogen, Ur: 0.2 mg/dL (ref 0.2–1.0)

## 2017-12-14 LAB — BAYER DCA HB A1C WAIVED: HB A1C (BAYER DCA - WAIVED): 7.4 % — ABNORMAL HIGH (ref <7.0)

## 2017-12-14 NOTE — Telephone Encounter (Signed)
OK 
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
## 2017-12-14 NOTE — Progress Notes (Signed)
BP 116/73   Pulse 67   Temp 98.6 F (37 C) (Oral)   Ht 5\' 9"  (1.753 m)   Wt 232 lb 12.8 oz (105.6 kg)   SpO2 96%   BMI 34.38 kg/m    Subjective:    Patient ID: Scott Lawson., male    DOB: 1940/06/17, 77 y.o.   MRN: [id number]  HPI: Scott Gift. is a 77 y.o. male  Chief Complaint  Patient presents with  . Follow-up    new medications   Diabetes: Using medications without difficulties.  Taking Ozempic .5mg , Metformin, and Glipizide.  He lost some weight and feels better and not so hungry No hypoglycemic episodes No hyperglycemic episodes Feet problems: Blood Sugars averaging: eye exam within last year Last Hgb A1C: 8.5  Hypertension  Using medications without difficulty Average home BPs   Using medication without problems or lightheadedness No chest pain with exertion or shortness of breath No Edema  Elevated Cholesterol Using medications without problems No Muscle aches  Diet:  Exercise:  UTI Presented to the ER on 6/26 for hypotension. Found to have a UTI.  Taking Ceftriaxone and finishing it on Saturday.   Feels better and urine has turned from rusty to clear  Relevant past medical, surgical, family and social history reviewed and updated as indicated. Interim medical history since our last visit reviewed. Allergies and medications reviewed and updated.  Review of Systems  Per HPI unless specifically indicated above     Objective:    BP 116/73   Pulse 67   Temp 98.6 F (37 C) (Oral)   Ht 5\' 9"  (1.753 m)   Wt 232 lb 12.8 oz (105.6 kg)   SpO2 96%   BMI 34.38 kg/m   Wt Readings from Last 3 Encounters:  12/14/17 232 lb 12.8 oz (105.6 kg)  12/08/17 224 lb (101.6 kg)  12/08/17 224 lb (101.6 kg)    Physical Exam  Constitutional: He is oriented to person, place, and time. He appears well-developed and well-nourished. No distress.  HENT:  Head: Normocephalic and atraumatic.  Eyes: Conjunctivae and lids are normal. Right eye  exhibits no discharge. Left eye exhibits no discharge. No scleral icterus.  Neck: Normal range of motion. Neck supple. No JVD present. Carotid bruit is not present.  Cardiovascular: Normal rate, regular rhythm and normal heart sounds.  Pulmonary/Chest: Effort normal and breath sounds normal. No respiratory distress.  Abdominal: Normal appearance. There is no splenomegaly or hepatomegaly.  Musculoskeletal: Normal range of motion.  Neurological: He is alert and oriented to person, place, and time.  Skin: Skin is warm, dry and intact. No rash noted. No pallor.  Psychiatric: He has a normal mood and affect. His behavior is normal. Judgment and thought content normal.    Results for orders placed or performed during the hospital encounter of 12/08/17  Urine Culture  Result Value Ref Range   Specimen Description      URINE, RANDOM Performed at [facility], [address], [place], [place] [address]    Special Requests      NONE Performed at [facility], [place], [place] [address]    Culture 40,000 COLONIES/mL SERRATIA MARCESCENS (A)    Report Status 12/11/2017 FINAL    Organism ID, Bacteria SERRATIA MARCESCENS (A)       Susceptibility   Serratia marcescens - MIC*    CEFAZOLIN >=64 RESISTANT Resistant     CEFTRIAXONE <=1 SENSITIVE Sensitive  CIPROFLOXACIN <=0.25 SENSITIVE Sensitive     GENTAMICIN <=1 SENSITIVE Sensitive     NITROFURANTOIN 256 RESISTANT Resistant     TRIMETH/SULFA <=20 SENSITIVE Sensitive     * 40,000 COLONIES/mL SERRATIA MARCESCENS  Basic metabolic panel  Result Value Ref Range   Sodium 136 135 - 145 mmol/L   Potassium 5.1 3.5 - 5.1 mmol/L   Chloride 106 98 - 111 mmol/L   CO2 20 (L) 22 - 32 mmol/L   Glucose, Bld 262 (H) 70 - 99 mg/dL   BUN 28 (H) 8 - 23 mg/dL   Creatinine, Ser 2.08 (H) 0.61 - 1.24 mg/dL   Calcium 8.9 8.9 - 10.3 mg/dL   GFR calc non Af Amer 29 (L) >60 mL/min   GFR calc Af Amer 34 (L) >60 mL/min   Anion  gap 10 5 - 15  CBC  Result Value Ref Range   WBC 8.2 3.8 - 10.6 K/uL   RBC 4.25 (L) 4.40 - 5.90 MIL/uL   Hemoglobin 13.6 13.0 - 18.0 g/dL   HCT 39.5 (L) 40.0 - 52.0 %   MCV 93.0 80.0 - 100.0 fL   MCH 31.9 26.0 - 34.0 pg   MCHC 34.3 32.0 - 36.0 g/dL   RDW 14.2 11.5 - 14.5 %   Platelets 230 150 - 440 K/uL  Urinalysis, Complete w Microscopic  Result Value Ref Range   Color, Urine YELLOW (A) YELLOW   APPearance CLOUDY (A) CLEAR   Specific Gravity, Urine 1.020 1.005 - 1.030   pH 5.0 5.0 - 8.0   Glucose, UA 50 (A) NEGATIVE mg/dL   Hgb urine dipstick SMALL (A) NEGATIVE   Bilirubin Urine NEGATIVE NEGATIVE   Ketones, ur NEGATIVE NEGATIVE mg/dL   Protein, ur 30 (A) NEGATIVE mg/dL   Nitrite NEGATIVE NEGATIVE   Leukocytes, UA MODERATE (A) NEGATIVE   RBC / HPF 11-20 0 - 5 RBC/hpf   WBC, UA >50 (H) 0 - 5 WBC/hpf   Bacteria, UA RARE (A) NONE SEEN   Squamous Epithelial / LPF 0-5 0 - 5   Mucus PRESENT    Hyaline Casts, UA PRESENT   Troponin I  Result Value Ref Range   Troponin I 0.03 (HH) <0.03 ng/mL  Troponin I  Result Value Ref Range   Troponin I <0.03 <0.03 ng/mL      Assessment & Plan:   Problem List Items Addressed This Visit      Unprioritized   Essential hypertension, benign    Stable, continue present medications.        Hyperlipidemia    Check lipid panel.  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  Not to goal but improving and significant recent stressors.  Recheck in 3 months      Relevant Orders   Comprehensive metabolic panel   Bayer DCA Hb A1c Waived    Other Visit Diagnoses    Acute cystitis without hematuria       Current antibiotic seems effective.  Recheck urine on Monday   Relevant Orders   UA/M w/rflx Culture, Routine   UA/M w/rflx Culture, Routine       Follow up plan: Return in about 3 months (around 03/16/2018).

## 2017-12-14 NOTE — Assessment & Plan Note (Signed)
Stable, continue present medications.   

## 2017-12-14 NOTE — Assessment & Plan Note (Signed)
Hgb A1C is 7.4%.  Not to goal but improving and significant recent stressors.  Recheck in 3 months

## 2017-12-14 NOTE — Telephone Encounter (Signed)
Unsuccessful attempts to reach patient by home & mobile phone numbers due to no voicemail has been set up. Also attempted to call daughter but phone has been disconnected.  Will continue to attempt to reach patient.

## 2017-12-14 NOTE — Telephone Encounter (Signed)
Patient angrily returned call. Attempted to explain Dr Cherrie Gauze reasoning per note below for not prescribing antibiotics during office visit on 12/08/2017. Patient states that he was told in the ER that he did in fact have an infection & states "it has been there for 15 days, ever since she did that surgery". Patient then stated that he was given an antibiotic in the emergency room & he will follow up with his PCP to make sure the infection is gone. Patient states "Dr Erlene Quan doesn't know what she is doing" & hung up.

## 2017-12-14 NOTE — Assessment & Plan Note (Signed)
Check lipid panel.  Adjust Atorvastatin as needed

## 2017-12-15 ENCOUNTER — Encounter: Payer: Self-pay | Admitting: Unknown Physician Specialty

## 2017-12-15 LAB — COMPREHENSIVE METABOLIC PANEL
A/G RATIO: 2.1 (ref 1.2–2.2)
ALT: 14 IU/L (ref 0–44)
AST: 13 IU/L (ref 0–40)
Albumin: 4.6 g/dL (ref 3.5–4.8)
Alkaline Phosphatase: 57 IU/L (ref 39–117)
BILIRUBIN TOTAL: 0.3 mg/dL (ref 0.0–1.2)
BUN/Creatinine Ratio: 22 (ref 10–24)
BUN: 29 mg/dL — ABNORMAL HIGH (ref 8–27)
CHLORIDE: 104 mmol/L (ref 96–106)
CO2: 18 mmol/L — ABNORMAL LOW (ref 20–29)
Calcium: 9.9 mg/dL (ref 8.6–10.2)
Creatinine, Ser: 1.3 mg/dL — ABNORMAL HIGH (ref 0.76–1.27)
GFR calc non Af Amer: 53 mL/min/{1.73_m2} — ABNORMAL LOW (ref >59)
GFR, EST AFRICAN AMERICAN: 61 mL/min/{1.73_m2} (ref >59)
Globulin, Total: 2.2 g/dL (ref 1.5–4.5)
Glucose: 85 mg/dL (ref 65–99)
POTASSIUM: 4.8 mmol/L (ref 3.5–5.2)
Sodium: 138 mmol/L (ref 134–144)
Total Protein: 6.8 g/dL (ref 6.0–8.5)

## 2017-12-20 ENCOUNTER — Other Ambulatory Visit: Payer: Medicare HMO

## 2017-12-20 DIAGNOSIS — N3000 Acute cystitis without hematuria: Secondary | ICD-10-CM

## 2017-12-20 LAB — UA/M W/RFLX CULTURE, ROUTINE
BILIRUBIN UA: NEGATIVE
Ketones, UA: NEGATIVE
Leukocytes, UA: NEGATIVE
Nitrite, UA: NEGATIVE
PROTEIN UA: NEGATIVE
RBC UA: NEGATIVE
SPEC GRAV UA: 1.01 (ref 1.005–1.030)
Urobilinogen, Ur: 0.2 mg/dL (ref 0.2–1.0)
pH, UA: 5.5 (ref 5.0–7.5)

## 2017-12-23 ENCOUNTER — Emergency Department
Admission: EM | Admit: 2017-12-23 | Discharge: 2017-12-23 | Disposition: A | Discharge status: Home / Self Care | Payer: Medicare HMO | Attending: Emergency Medicine | Admitting: Emergency Medicine

## 2017-12-23 ENCOUNTER — Encounter: Payer: Self-pay | Admitting: Emergency Medicine

## 2017-12-23 ENCOUNTER — Other Ambulatory Visit: Payer: Self-pay

## 2017-12-23 ENCOUNTER — Emergency Department: Payer: Medicare HMO

## 2017-12-23 DIAGNOSIS — R319 Hematuria, unspecified: Secondary | ICD-10-CM

## 2017-12-23 DIAGNOSIS — Z79899 Other long term (current) drug therapy: Secondary | ICD-10-CM | POA: Diagnosis not present

## 2017-12-23 DIAGNOSIS — Z794 Long term (current) use of insulin: Secondary | ICD-10-CM | POA: Insufficient documentation

## 2017-12-23 DIAGNOSIS — R0602 Shortness of breath: Secondary | ICD-10-CM | POA: Diagnosis not present

## 2017-12-23 DIAGNOSIS — I129 Hypertensive chronic kidney disease with stage 1 through stage 4 chronic kidney disease, or unspecified chronic kidney disease: Secondary | ICD-10-CM | POA: Diagnosis not present

## 2017-12-23 DIAGNOSIS — Z7982 Long term (current) use of aspirin: Secondary | ICD-10-CM | POA: Insufficient documentation

## 2017-12-23 DIAGNOSIS — N182 Chronic kidney disease, stage 2 (mild): Secondary | ICD-10-CM | POA: Insufficient documentation

## 2017-12-23 DIAGNOSIS — N390 Urinary tract infection, site not specified: Secondary | ICD-10-CM

## 2017-12-23 DIAGNOSIS — N3001 Acute cystitis with hematuria: Secondary | ICD-10-CM | POA: Insufficient documentation

## 2017-12-23 DIAGNOSIS — E1122 Type 2 diabetes mellitus with diabetic chronic kidney disease: Secondary | ICD-10-CM | POA: Diagnosis not present

## 2017-12-23 LAB — URINALYSIS, COMPLETE (UACMP) WITH MICROSCOPIC
Bilirubin Urine: NEGATIVE
Glucose, UA: NEGATIVE mg/dL
KETONES UR: NEGATIVE mg/dL
Nitrite: NEGATIVE
PH: 5 (ref 5.0–8.0)
Protein, ur: 100 mg/dL — AB
RBC / HPF: >50 RBC/hpf — ABNORMAL HIGH (ref 0–5)
SQUAMOUS EPITHELIAL / LPF: NONE SEEN (ref 0–5)
Specific Gravity, Urine: 1.012 (ref 1.005–1.030)
WBC, UA: >50 WBC/hpf — ABNORMAL HIGH (ref 0–5)

## 2017-12-23 LAB — CBC
HEMATOCRIT: 34.9 % — AB (ref 40.0–52.0)
Hemoglobin: 11.9 g/dL — ABNORMAL LOW (ref 13.0–18.0)
MCH: 31.5 pg (ref 26.0–34.0)
MCHC: 34.1 g/dL (ref 32.0–36.0)
MCV: 92.4 fL (ref 80.0–100.0)
Platelets: 227 10*3/uL (ref 150–440)
RBC: 3.78 MIL/uL — ABNORMAL LOW (ref 4.40–5.90)
RDW: 13.9 % (ref 11.5–14.5)
WBC: 8.6 10*3/uL (ref 3.8–10.6)

## 2017-12-23 LAB — BASIC METABOLIC PANEL
Anion gap: 9 (ref 5–15)
BUN: 26 mg/dL — AB (ref 8–23)
CHLORIDE: 101 mmol/L (ref 98–111)
CO2: 24 mmol/L (ref 22–32)
Calcium: 8.7 mg/dL — ABNORMAL LOW (ref 8.9–10.3)
Creatinine, Ser: 1.76 mg/dL — ABNORMAL HIGH (ref 0.61–1.24)
GFR calc Af Amer: 42 mL/min — ABNORMAL LOW (ref >60)
GFR calc non Af Amer: 36 mL/min — ABNORMAL LOW (ref >60)
Glucose, Bld: 122 mg/dL — ABNORMAL HIGH (ref 70–99)
POTASSIUM: 4.1 mmol/L (ref 3.5–5.1)
SODIUM: 134 mmol/L — AB (ref 135–145)

## 2017-12-23 LAB — LACTIC ACID, PLASMA: Lactic Acid, Venous: 1.7 mmol/L (ref 0.5–1.9)

## 2017-12-23 LAB — TROPONIN I: Troponin I: 0.03 ng/mL (ref <0.03)

## 2017-12-23 LAB — BRAIN NATRIURETIC PEPTIDE: B NATRIURETIC PEPTIDE 5: 59 pg/mL (ref 0.0–100.0)

## 2017-12-23 MED ORDER — CIPROFLOXACIN HCL 500 MG PO TABS: 500.0000 mg | ORAL_TABLET | Freq: Two times a day (BID) | ORAL | 0 refills | Status: AC

## 2017-12-23 MED ORDER — CIPROFLOXACIN HCL 500 MG PO TABS
500.0000 mg | ORAL_TABLET | Freq: Once | ORAL | Status: AC
  Administered 2017-12-23: 500 mg via ORAL
  Filled 2017-12-23: qty 1

## 2017-12-23 NOTE — Discharge Instructions (Signed)
As we discussed, your work-up was reassuring today except that it once again appears you have an infection in your urine.  We gave you the same antibiotic you were given last time based on your previous urine culture results.  It is very important, however, that you follow-up with a urologist given your persistent symptoms.  If you do not want to follow-up with Dr. Erlene Quan, we recommend that you discuss your situation with your doctor at the [facility] and see if they will provide you or sign you a urologist.  If not, consider contacting Dr. Yves Dill, another local urologist.    Return to the emergency department if you develop new or worsening symptoms that concern you.

## 2017-12-23 NOTE — ED Provider Notes (Signed)
[facility] Emergency Department Provider Note  ____________________________________________   First MD Initiated Contact with Patient 12/23/17 1646     (approximate)  I have reviewed the triage vital signs and the nursing notes.   HISTORY  Chief Complaint Shortness of Breath    HPI Scott Lawson. is a 77 y.o. male with medical history as listed below who presents for evaluation of some shortness of breath and changes in his urine.  The patient reports that he had a bladder procedure by Dr. Erlene Quan about a month ago and was seen 2 weeks ago by Dr. Archie Balboa in this emergency department.  He reports that "they told me the infection was everywhere" and that he had not been appropriately treated for a urinary tract infection.  There seems to be a significant discrepancy between what was documented by Dr. Archie Balboa and the patient's perception of what he was told and of the results.  Documentation in the computer indicates that the patient has refused to go back to Dr. Erlene Quan.  Based on the urine cultures he was treated with Cipro and the patient just completed his course of Cipro about 4 days ago.  He states that as soon as he stopped taking the antibiotic his urine started getting cloudy again and he states that "I know my body and I know the infection is back".  Over the last 2 to 3 hours he is also been having some shortness of breath and reported elevated blood pressure although his blood pressure is actually on the low side in the emergency department.  He is not having any cough and denies chest pain.  He has some chronic kidney disease and states he has been drinking fluids appropriately.  He denies fever/chills, flank pain, nausea, vomiting, and abdominal pain.  He has no dysuria but states that his urine "looks funny" like it does when he has an infection.  He describes the symptoms as moderate and nothing makes them better or worse.  Past Medical History:    Diagnosis Date  . BPH (benign prostatic hyperplasia)   . Diabetes mellitus without complication ([place])   . Elevated PSA   . GERD (gastroesophageal reflux disease)   . Hypertension   . Hypertriglyceridemia   . Nocturia     Patient Active Problem List   Diagnosis Date Noted  . Psoriasis 08/16/2017  . BPH with urinary obstruction 08/02/2017  . Advanced care planning/counseling discussion 12/01/2016  . Nocturia 06/02/2016  . GERD (gastroesophageal reflux disease) 08/14/2015  . 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 Essential hypertension, benign 08/13/2015  . 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 Hyperlipidemia 02/11/2015  . BPH (benign prostatic hyperplasia) 02/11/2015  . Hypertensive CKD (chronic kidney disease) 02/11/2015    Past Surgical History:  Procedure Laterality Date  . APPENDECTOMY    . CARPAL TUNNEL RELEASE Left 12/11/2015   Procedure: CARPAL TUNNEL RELEASE;  Surgeon: Earnestine Leys, MD;  Location: [facility] ORS;  Service: Orthopedics;  Laterality: Left;  . EYE SURGERY     laser  . TRANSURETHRAL RESECTION OF PROSTATE N/A 08/02/2017   Procedure: TRANSURETHRAL RESECTION OF THE PROSTATE (TURP);  Surgeon: Hollice Espy, MD;  Location: [facility] ORS;  Service: Urology;  Laterality: N/A;    Prior to Admission medications   Medication Sig Start Date End Date Taking? Authorizing Provider  aspirin 81 MG tablet Take 81 mg by mouth daily.    [provider]  atorvastatin (  LIPITOR) 40 MG tablet TAKE 1 TABLET (40 MG TOTAL) BY MOUTH DAILY. 11/25/17   Kathrine Haddock, NP  augmented betamethasone dipropionate (DIPROLENE AF) 0.05 % cream Apply topically 2 (two) times daily. 08/16/17   Kathrine Haddock, NP  cephALEXin (KEFLEX) 500 MG capsule Take 1 capsule (500 mg total) by mouth 3 (three) times daily. 12/08/17   Nance Pear, MD  ciprofloxacin (CIPRO) 500 MG tablet Take 1 tablet (500 mg total) by mouth 2 (two) times daily for  14 days. 12/23/17 01/06/18  Hinda Kehr, MD  docusate sodium (COLACE) 100 MG capsule Take 1 capsule (100 mg total) by mouth 2 (two) times daily. 08/03/17   Hollice Espy, MD  finasteride (PROSCAR) 5 MG tablet Take 5 mg by mouth daily. 12/08/17   [provider]  fluticasone (FLONASE) 50 MCG/ACT nasal spray Place 2 sprays into both nostrils daily as needed.     [provider]  glipiZIDE (GLUCOTROL) 10 MG tablet Take 1 tablet (10 mg total) by mouth 2 (two) times daily before a meal. 06/14/17   Kathrine Haddock, NP  glucose blood test strip 1 each by Other route as needed for other. Use as instructed    [provider]  insulin glargine (LANTUS) 100 UNIT/ML injection Inject 30 Units into the skin every evening. 1800    [provider]  lisinopril (PRINIVIL,ZESTRIL) 20 MG tablet Take 1 tablet (20 mg total) by mouth daily. 05/28/17   Kathrine Haddock, NP  lisinopril-hydrochlorothiazide (PRINZIDE,ZESTORETIC) 20-12.5 MG tablet TAKE 1 TABLET EVERY DAY 09/24/17   Kathrine Haddock, NP  meloxicam (MOBIC) 15 MG tablet Take 15 mg by mouth daily. 07/29/16   [provider]  metFORMIN (GLUCOPHAGE) 500 MG tablet TAKE 1 TABLET (500 MG TOTAL) BY MOUTH 2 (TWO) TIMES DAILY WITH A MEAL. 11/25/17   Kathrine Haddock, NP  nitrofurantoin, macrocrystal-monohydrate, (MACROBID) 100 MG capsule Take 1 capsule (100 mg total) by mouth every 12 (twelve) hours. Patient not taking: Reported on 12/23/2017 11/10/17   Zara Council A, PA-C  nystatin-triamcinolone ointment (MYCOLOG) Apply 1 application topically 2 (two) times daily. 11/10/17   Zara Council A, PA-C  omeprazole (PRILOSEC) 20 MG capsule Take 1 capsule (20 mg total) by mouth daily. 07/29/16   Kathrine Haddock, NP  Semaglutide (OZEMPIC) 0.25 or 0.5 MG/DOSE SOPN Inject 0.5 mg into the skin once a week. 10/12/17   Kathrine Haddock, NP  tamsulosin (FLOMAX) 0.4 MG CAPS capsule Take 1 capsule by mouth daily. 12/08/17   [provider]    TRUEPLUS LANCETS 33G MISC  12/10/17   [provider]    Allergies Gabapentin  Family History  Problem Relation Age of Onset  . Cancer Mother        lung  . Heart disease Father   . Diabetes Brother   . Prostate cancer Neg Hx   . Bladder Cancer Neg Hx   . Kidney cancer Neg Hx     Social History Social History   Tobacco Use  . Smoking status: Never Smoker  . Smokeless tobacco: Never Used  Substance Use Topics  . Alcohol use: No    Alcohol/week: 0.0 oz  . Drug use: No    Review of Systems Constitutional: No fever/chills Eyes: No visual changes. ENT: No sore throat. Cardiovascular: Denies chest pain. Respiratory: shortness of breath for a couple of hours Gastrointestinal: No abdominal pain.  No nausea, no vomiting.  No diarrhea.  No constipation. Genitourinary: No dysuria but cloudy and abnormal appearing urine Musculoskeletal: Negative for neck  pain.  Negative for back pain. Integumentary: Negative for rash. Neurological: Negative for headaches, focal weakness or numbness.   ____________________________________________   PHYSICAL EXAM:  VITAL SIGNS: ED Triage Vitals [12/23/17 1609]  Enc Vitals Group     BP (!) 105/52     Pulse Rate (!) 57     Resp 18     Temp 98.1 F (36.7 C)     Temp Source Oral     SpO2 95 %     Weight 105.2 kg (232 lb)     Height 1.753 m (5\' 9" )     Head Circumference      Peak Flow      Pain Score 3     Pain Loc      Pain Edu?      Excl. in [place]?     Constitutional: Alert and oriented. Well appearing and in no acute distress. Eyes: Conjunctivae are normal.  Head: Atraumatic. Nose: No congestion/rhinnorhea. Mouth/Throat: Mucous membranes are moist. Neck: No stridor.  No meningeal signs.   Cardiovascular: Normal rate, regular rhythm. Good peripheral circulation. Grossly normal heart sounds. Respiratory: Normal respiratory effort.  No retractions. Lungs CTAB.  Speaking clearly and without difficulty. Gastrointestinal:  Soft and nontender. No distention.  Musculoskeletal: No lower extremity tenderness nor edema. No gross deformities of extremities. Neurologic:  Normal speech and language. No gross focal neurologic deficits are appreciated.  Skin:  Skin is warm, dry and intact. No rash noted. Psychiatric: Mood and affect are normal. Speech and behavior are normal.  ____________________________________________   LABS (all labs ordered are listed, but only abnormal results are displayed)  Labs Reviewed  BASIC METABOLIC PANEL - Abnormal; Notable for the following components:      Result Value   Sodium 134 (*)    Glucose, Bld 122 (*)    BUN 26 (*)    Creatinine, Ser 1.76 (*)    Calcium 8.7 (*)    GFR calc non Af Amer 36 (*)    GFR calc Af Amer 42 (*)    All other components within normal limits  CBC - Abnormal; Notable for the following components:   RBC 3.78 (*)    Hemoglobin 11.9 (*)    HCT 34.9 (*)    All other components within normal limits  URINALYSIS, COMPLETE (UACMP) WITH MICROSCOPIC - Abnormal; Notable for the following components:   Color, Urine YELLOW (*)    APPearance TURBID (*)    Hgb urine dipstick MODERATE (*)    Protein, ur 100 (*)    Leukocytes, UA MODERATE (*)    RBC / HPF >50 (*)    WBC, UA >50 (*)    Bacteria, UA MANY (*)    All other components within normal limits  TROPONIN I - Abnormal; Notable for the following components:   Troponin I 0.03 (*)    All other components within normal limits  URINE CULTURE  LACTIC ACID, PLASMA  BRAIN NATRIURETIC PEPTIDE  PROCALCITONIN   ____________________________________________  EKG  ED ECG REPORT I, Hinda Kehr, the attending physician, personally viewed and interpreted this ECG.  Date: 12/23/2017 EKG Time: 16:12 Rate: 61 Rhythm: normal sinus rhythm QRS Axis: normal Intervals: normal ST/T Wave abnormalities: Non-specific ST segment / T-wave changes, but no evidence of acute ischemia. Narrative Interpretation: no  evidence of acute ischemia   ____________________________________________  RADIOLOGY I, Hinda Kehr, personally viewed and evaluated these images (plain radiographs) as part of my medical decision making, as well as reviewing the written  report by the radiologist.  ED MD interpretation:  No acute abnormalities on CXR  Official radiology report(s): Dg Chest 2 View  Result Date: 12/23/2017 CLINICAL DATA:  Elevated blood pressure and shortness of breath for 2-3 hours, postoperative infection/pneumonia diagnosed 2 weeks ago, treated with antibiotics, history hypertension, diabetes mellitus EXAM: CHEST - 2 VIEW COMPARISON:  08/14/2015 FINDINGS: Upper normal heart size. Atherosclerotic calcification aorta. Mediastinal contours and pulmonary vascularity normal. Lungs grossly clear. No definite infiltrate, pleural effusion or pneumothorax. Scattered endplate spur formation thoracic spine. IMPRESSION: No acute abnormalities. Electronically Signed   By: Lavonia Dana M.D.   On: 12/23/2017 17:45    ____________________________________________   PROCEDURES  Critical Care performed: No   Procedure(s) performed:   Procedures   ____________________________________________   INITIAL IMPRESSION / ASSESSMENT AND PLAN / ED COURSE  As part of my medical decision making, I reviewed the following data within the [place] notes reviewed and incorporated, Labs reviewed , EKG interpreted , Old chart reviewed, Radiograph reviewed  and Notes from prior ED visits    Differential diagnosis includes, but is not limited to, persistent urinary tract infection, bladder cancer, kidney stones, pneumonia, ACS.  The patient is well-appearing in no acute distress with reassuring and normal vital signs.  His lung sounds are clear and he has no tenderness to palpation of his abdomen.  As described above, he is very adamant in his understanding of what he has been told in the past regarding  his infection although this does not correlate with the medical record.  He is also adamant that he will not follow-up with Dr. Erlene Quan.  His urine once again does have hematuria and "many" bacteria.  Urine cultures in the past have persistently shown Serratia marcescens which is sensitive to Cipro and the patient seems pleased with the Cipro.  Additionally, the in the medical record Dr. Erlene Quan mentioned the Cipro would be an appropriate choice for the patient.  I am recommending to the patient that he follow-up with the urologist, if not Dr. Erlene Quan then Dr. Yves Dill (for whom I gave him contact information) or at [facility] or [place].  There is no indication for any additional imaging at this time.  The patient has no signs of sepsis with a normal lactic acid.  His creatinine is actually improved from prior, he has no leukocytosis, and his troponin is essentially normal at 0.03 with known chronic kidney disease.  No evidence of acute ischemia on EKG and his chest x-ray is clear.  The patient is comfortable with the plan and I gave my usual customary return precautions.  Clinical Course as of Dec 24 2047  Thu Dec 23, 2017  1831 B Natriuretic Peptide: 59.0 [CF]    Clinical Course User Index [CF] Hinda Kehr, MD    ____________________________________________  FINAL CLINICAL IMPRESSION(S) / ED DIAGNOSES  Final diagnoses:  Urinary tract infection with hematuria, site unspecified     MEDICATIONS GIVEN DURING THIS VISIT:  Medications  ciprofloxacin (CIPRO) tablet 500 mg (500 mg Oral Given 12/23/17 1845)     ED Discharge Orders        Ordered    ciprofloxacin (CIPRO) 500 MG tablet  2 times daily     12/23/17 1833       Note:  This document was prepared using Dragon voice recognition software and may include unintentional dictation errors.    Hinda Kehr, MD 12/23/17 2049

## 2017-12-23 NOTE — ED Notes (Signed)
Date and time results received: 12/23/17 1745 (use smartphrase ".now" to insert current time)  Test: Trop I Critical Value: 0.03  Name of Provider Notified: Karma Greaser  Orders Received? Or Actions Taken?: No new orders at this time.

## 2017-12-23 NOTE — ED Triage Notes (Signed)
Pt presents to ED via POV with c/o elevated BP and SHOB x 2-3 hrs PTA. Pt states was seen here approx 2 weeks ago and dx with infection post surgery and given abx. Pt's family member also reports that patient is unsteady on his feet at this time. Pt is alert and oriented at this time.

## 2017-12-24 ENCOUNTER — Telehealth: Payer: Self-pay | Admitting: Unknown Physician Specialty

## 2017-12-24 LAB — PROCALCITONIN: Procalcitonin: <0.1 ng/mL

## 2017-12-24 NOTE — Telephone Encounter (Signed)
Patient returned my call. He states that he went to the ER last night and has an infection. Was given an antibiotic for 14 days.

## 2017-12-24 NOTE — Telephone Encounter (Signed)
Copied from [place] [id number]. Topic: General - Other >> Dec 23, 2017  8:44 AM Keene Breath wrote: Reason for CRM: Patient called to get the results of his urine test.  He said the office said they would call but he has not heard anything yet.  Would like a call back today.  CB# [phone].   The urine last time in the office was OK.  It looks like he went to the ER last night.  Please f/u on see how he is doing.

## 2017-12-24 NOTE — Telephone Encounter (Signed)
Tried calling patient back and both numbers listed in the chart. Patient did not answer and no VM was available on either number. Will try to call again later.

## 2017-12-26 LAB — URINE CULTURE: Special Requests: NORMAL

## 2018-01-04 ENCOUNTER — Telehealth: Payer: Self-pay

## 2018-01-04 DIAGNOSIS — N138 Other obstructive and reflux uropathy: Secondary | ICD-10-CM

## 2018-01-04 DIAGNOSIS — N401 Enlarged prostate with lower urinary tract symptoms: Principal | ICD-10-CM

## 2018-01-04 NOTE — Telephone Encounter (Signed)
Will do!

## 2018-01-04 NOTE — Telephone Encounter (Signed)
Patient notified that referral was entered.

## 2018-01-04 NOTE — Telephone Encounter (Signed)
Copied from [place] [id number]. Topic: Referral - Request >> Jan 03, 2018 11:59 AM Yvette Rack wrote: Reason for CRM: Pt requests a referral to Dr. Rogers Blocker for a second opinion. Pt request call back. Cb# [phone]   Routing to provider for referral to Dr. Rogers Blocker per patient request.

## 2018-01-05 ENCOUNTER — Other Ambulatory Visit: Payer: Self-pay | Admitting: Unknown Physician Specialty

## 2018-01-11 ENCOUNTER — Telehealth: Payer: Self-pay | Admitting: Urology

## 2018-01-11 NOTE — Telephone Encounter (Signed)
Patient canceled his app stated he has transferred his care to the [place].  Sharyn Lull

## 2018-01-14 ENCOUNTER — Ambulatory Visit: Payer: Medicare HMO | Admitting: Urology

## 2018-02-02 ENCOUNTER — Other Ambulatory Visit: Payer: Self-pay | Admitting: Unknown Physician Specialty

## 2018-02-06 ENCOUNTER — Other Ambulatory Visit: Payer: Self-pay | Admitting: Unknown Physician Specialty

## 2018-02-07 NOTE — Telephone Encounter (Signed)
Medication has been taken care of by the [facility]. Patient is requesting to be called to schedule a office visit after 03/03/18.

## 2018-02-07 NOTE — Telephone Encounter (Signed)
Patient called to discuss the refill request for Tamsulosin. I advised the patient that it was noted by the urologist in June that the medication was discontinued. I asked the patient is he taking it, because of the refill request from [facility]. He says "no, I'm not taking it anymore." I advised him to call [employer] to let them know he is no longer on it, so they will stop sending refill requests, he verbalized understanding. He asks about an appointment he's supposed to have set up in September with a new provider. He says the office can call him anytime after 03/03/18 to set up the appointment, because he'll be going back and forth to the [place] up until then. I advised I will send this request to the office.

## 2018-02-07 NOTE — Telephone Encounter (Signed)
Please see note and advise  

## 2018-02-09 ENCOUNTER — Other Ambulatory Visit: Payer: Self-pay | Admitting: Unknown Physician Specialty

## 2018-02-09 DIAGNOSIS — N138 Other obstructive and reflux uropathy: Secondary | ICD-10-CM

## 2018-02-09 DIAGNOSIS — N401 Enlarged prostate with lower urinary tract symptoms: Principal | ICD-10-CM

## 2018-02-09 NOTE — Telephone Encounter (Signed)
Patient called and asked about taking Finasteride as it is documented in the [facility] note from the urologist on 12/08/17 for the patient to hold. He says he was not told that and he's been taking it all along and has enough for 8 days left. I advised this will be sent to the provider for refill.  Finasteride refill Last Refill:12/08/17 by historical provider Last OV: 12/14/17; Upcoming 03/23/18 PCP: [place]: [facility] - [place], [place] [phone] (Phone) [phone] (Fax)

## 2018-02-10 NOTE — Telephone Encounter (Signed)
I'll give 30 days and he will need to talk with new provider about this conflicting information.

## 2018-03-02 ENCOUNTER — Other Ambulatory Visit: Payer: Medicare HMO

## 2018-03-23 ENCOUNTER — Encounter: Payer: Self-pay | Admitting: Nurse Practitioner

## 2018-03-23 ENCOUNTER — Ambulatory Visit (INDEPENDENT_AMBULATORY_CARE_PROVIDER_SITE_OTHER): Payer: Medicare HMO | Admitting: Nurse Practitioner

## 2018-03-23 ENCOUNTER — Other Ambulatory Visit: Payer: Self-pay

## 2018-03-23 DIAGNOSIS — IMO0002 Reserved for concepts with insufficient information to code with codable children: Secondary | ICD-10-CM

## 2018-03-23 DIAGNOSIS — I10 Essential (primary) hypertension: Secondary | ICD-10-CM | POA: Diagnosis not present

## 2018-03-23 DIAGNOSIS — E1165 Type 2 diabetes mellitus with hyperglycemia: Secondary | ICD-10-CM | POA: Diagnosis not present

## 2018-03-23 DIAGNOSIS — E782 Mixed hyperlipidemia: Secondary | ICD-10-CM | POA: Diagnosis not present

## 2018-03-23 DIAGNOSIS — E1122 Type 2 diabetes mellitus with diabetic chronic kidney disease: Secondary | ICD-10-CM

## 2018-03-23 LAB — BAYER DCA HB A1C WAIVED: HB A1C: 8.5 % — AB (ref <7.0)

## 2018-03-23 NOTE — Assessment & Plan Note (Addendum)
A1C today 8.5%.  Had all labs performed recently at [facility].  He has f/u on 17th with PCP at the [place] and she has requested he return to his 35 units (which he did 2 weeks ago) and they wished to recheck him on 17th and determine adjustments.  Have written down for patient to have information shared with PCP in office.  Recheck in 3 months. Continue to collaborate with [facility] PCP and have written down request for patient to take with him to [place], for endo consult in house there.  May be able to assist with medication costs.

## 2018-03-23 NOTE — Assessment & Plan Note (Signed)
Lipid panel performed at [facility] recently, will attempt to obtain records.  Adjust Atorvastatin as needed.  Collaborate with [facility] PCP.

## 2018-03-23 NOTE — Assessment & Plan Note (Signed)
Well-controlled BP.  Continue current medication regimen.

## 2018-03-23 NOTE — Patient Instructions (Signed)
Diabetes Mellitus and Nutrition When you have diabetes (diabetes mellitus), it is very important to have healthy eating habits because your blood sugar (glucose) levels are greatly affected by what you eat and drink. Eating healthy foods in the appropriate amounts, at about the same times every day, can help you:  Control your blood glucose.  Lower your risk of heart disease.  Improve your blood pressure.  Reach or maintain a healthy weight.  Every person with diabetes is different, and each person has different needs for a meal plan. Your health care provider may recommend that you work with a diet and nutrition specialist (dietitian) to make a meal plan that is best for you. Your meal plan may vary depending on factors such as:  The calories you need.  The medicines you take.  Your weight.  Your blood glucose, blood pressure, and cholesterol levels.  Your activity level.  Other health conditions you have, such as heart or kidney disease.  How do carbohydrates affect me? Carbohydrates affect your blood glucose level more than any other type of food. Eating carbohydrates naturally increases the amount of glucose in your blood. Carbohydrate counting is a method for keeping track of how many carbohydrates you eat. Counting carbohydrates is important to keep your blood glucose at a healthy level, especially if you use insulin or take certain oral diabetes medicines. It is important to know how many carbohydrates you can safely have in each meal. This is different for every person. Your dietitian can help you calculate how many carbohydrates you should have at each meal and for snack. Foods that contain carbohydrates include:  Bread, cereal, rice, pasta, and crackers.  Potatoes and corn.  Peas, beans, and lentils.  Milk and yogurt.  Fruit and juice.  Desserts, such as cakes, cookies, ice cream, and candy.  How does alcohol affect me? Alcohol can cause a sudden decrease in blood  glucose (hypoglycemia), especially if you use insulin or take certain oral diabetes medicines. Hypoglycemia can be a life-threatening condition. Symptoms of hypoglycemia (sleepiness, dizziness, and confusion) are similar to symptoms of having too much alcohol. If your health care provider says that alcohol is safe for you, follow these guidelines:  Limit alcohol intake to no more than 1 drink per day for nonpregnant women and 2 drinks per day for men. One drink equals 12 oz of beer, 5 oz of wine, or 1 oz of hard liquor.  Do not drink on an empty stomach.  Keep yourself hydrated with water, diet soda, or unsweetened iced tea.  Keep in mind that regular soda, juice, and other mixers may contain a lot of sugar and must be counted as carbohydrates.  What are tips for following this plan? Reading food labels  Start by checking the serving size on the label. The amount of calories, carbohydrates, fats, and other nutrients listed on the label are based on one serving of the food. Many foods contain more than one serving per package.  Check the total grams (g) of carbohydrates in one serving. You can calculate the number of servings of carbohydrates in one serving by dividing the total carbohydrates by 15. For example, if a food has 30 g of total carbohydrates, it would be equal to 2 servings of carbohydrates.  Check the number of grams (g) of saturated and trans fats in one serving. Choose foods that have low or no amount of these fats.  Check the number of milligrams (mg) of sodium in one serving. Most people   should limit total sodium intake to less than 2,300 mg per day.  Always check the nutrition information of foods labeled as "low-fat" or "nonfat". These foods may be higher in added sugar or refined carbohydrates and should be avoided.  Talk to your dietitian to identify your daily goals for nutrients listed on the label. Shopping  Avoid buying canned, premade, or processed foods. These  foods tend to be high in fat, sodium, and added sugar.  Shop around the outside edge of the grocery store. This includes fresh fruits and vegetables, bulk grains, fresh meats, and fresh dairy. Cooking  Use low-heat cooking methods, such as baking, instead of high-heat cooking methods like deep frying.  Cook using healthy oils, such as olive, canola, or sunflower oil.  Avoid cooking with butter, cream, or high-fat meats. Meal planning  Eat meals and snacks regularly, preferably at the same times every day. Avoid going long periods of time without eating.  Eat foods high in fiber, such as fresh fruits, vegetables, beans, and whole grains. Talk to your dietitian about how many servings of carbohydrates you can eat at each meal.  Eat 4-6 ounces of lean protein each day, such as lean meat, chicken, fish, eggs, or tofu. 1 ounce is equal to 1 ounce of meat, chicken, or fish, 1 egg, or 1/4 cup of tofu.  Eat some foods each day that contain healthy fats, such as avocado, nuts, seeds, and fish. Lifestyle   Check your blood glucose regularly.  Exercise at least 30 minutes 5 or more days each week, or as told by your health care provider.  Take medicines as told by your health care provider.  Do not use any products that contain nicotine or tobacco, such as cigarettes and e-cigarettes. If you need help quitting, ask your health care provider.  Work with a counselor or diabetes educator to identify strategies to manage stress and any emotional and social challenges. What are some questions to ask my health care provider?  Do I need to meet with a diabetes educator?  Do I need to meet with a dietitian?  What number can I call if I have questions?  When are the best times to check my blood glucose? Where to find more information:  American Diabetes Association: diabetes.org/food-and-fitness/food  Academy of Nutrition and Dietetics:  www.eatright.org/resources/health/diseases-and-conditions/diabetes  National Institute of Diabetes and Digestive and Kidney Diseases (NIH): www.niddk.nih.gov/health-information/diabetes/overview/diet-eating-physical-activity Summary  A healthy meal plan will help you control your blood glucose and maintain a healthy lifestyle.  Working with a diet and nutrition specialist (dietitian) can help you make a meal plan that is best for you.  Keep in mind that carbohydrates and alcohol have immediate effects on your blood glucose levels. It is important to count carbohydrates and to use alcohol carefully. This information is not intended to replace advice given to you by your health care provider. Make sure you discuss any questions you have with your health care provider. Document Released: 02/26/2005 Document Revised: 07/06/2016 Document Reviewed: 07/06/2016 Elsevier Interactive Patient Education  2018 Elsevier Inc.  

## 2018-03-23 NOTE — Progress Notes (Signed)
BP 129/66   Pulse (!) 44   Temp 97.7 F (36.5 C) (Oral)   Ht 5\' 9"  (1.753 m)   Wt 233 lb (105.7 kg)   SpO2 98%   BMI 34.41 kg/m    Subjective:    Patient ID: Scott Roof., male    DOB: 1941-04-13, 77 y.o.   MRN: [id number]  HPI: Scott Melman. is a 77 y.o. male presents for follow-up visit.  Chief Complaint  Patient presents with  . Diabetes    69m f/u  . Hypertension  . Hyperlipidemia   DIABETES Hypoglycemic episodes:no Polydipsia/polyuria: no Visual disturbance: no Chest pain: no Paresthesias: no Glucose Monitoring: yes  Accucheck frequency: TID  Fasting glucose: 90-102  Post prandial: 260-270  Evening: 250 - 260  Before meals: none Taking Insulin?: yes  Long acting insulin: Lantus 35 units  Short acting insulin: none Blood Pressure Monitoring: daily Retinal Examination: Up to Date Foot Exam: Up to Date Diabetic Education: Completed Pneumovax: Up to Date Influenza: Up to Date Aspirin: yes  Is being seen at the [facility], just started being followed.  He reports he can not afford the Ozempic, it is over $90 with no generic available.  He stopped taking it at the beginning of August and then in September he increased his Lantus back to previous dose of 35 units.  When he went for initial [place] appointment his Lantus was increased to 45 units, but he reports it was too "strong" and he was having FSBS in morning of 70 or less with symptoms; so he returned to 35 units.  With 35 units his FSBS in morning are 90 to 102.  HYPERTENSION Hypertension status: controlled  Satisfied with current treatment? yes Duration of hypertension: chronic BP monitoring frequency:  daily BP range:  BP medication side effects:  no Medication compliance: excellent compliance Previous BP meds: does not recall Aspirin: yes Recurrent headaches: no Visual changes: no Palpitations: no Dyspnea: no Chest pain: no Lower extremity edema: no Dizzy/lightheaded: no  At home BP  runs SBP 120-130 and DBP 70-80.  HYPERLIPIDEMIA Hyperlipidemia status: excellent compliance Satisfied with current treatment?  yes Side effects:  no Medication compliance: excellent compliance Past cholesterol meds:  none The 10-year ASCVD risk score Scott Bussing DC Jr., Scott al., Scott Lawson) is: 48.8%   Values used to calculate the score:     Age: 59 years     Sex: Male     Is Non-Hispanic African American: No     Diabetic: Yes     Tobacco smoker: No     Systolic Blood Pressure: 409 mmHg     Is BP treated: Yes     HDL Cholesterol: 50 mg/dL     Total Cholesterol: 173 mg/dL Chest pain:  no Coronary artery disease:  no Family history CAD:  yes, father had MI with open heart surgery per report Family history early CAD:  no   Relevant past medical, surgical, family and social history reviewed and updated as indicated. Interim medical history since our last visit reviewed. Allergies and medications reviewed and updated.  Review of Systems  Constitutional: Negative for activity change, fatigue and fever.  Respiratory: Negative for cough, chest tightness, shortness of breath and wheezing.   Cardiovascular: Negative for chest pain, palpitations and leg swelling.  Gastrointestinal: Negative for abdominal distention and abdominal pain.  Endocrine: Negative for polydipsia, polyphagia and polyuria.  Genitourinary: Negative for dysuria, frequency and urgency.  Neurological: Negative for dizziness, light-headedness and numbness.  Per HPI unless specifically indicated above     Objective:    BP 129/66   Pulse (!) 44   Temp 97.7 F (36.5 C) (Oral)   Ht 5\' 9"  (1.753 m)   Wt 233 lb (105.7 kg)   SpO2 98%   BMI 34.41 kg/m   Wt Readings from Last 3 Encounters:  03/23/18 233 lb (105.7 kg)  12/23/17 232 lb (105.2 kg)  12/14/17 232 lb 12.8 oz (105.6 kg)    Physical Exam  Constitutional: He is oriented to person, place, and time. He appears well-developed and well-nourished.  HENT:  Head:  Normocephalic and atraumatic.  Eyes: Pupils are equal, round, and reactive to light. EOM are normal.  Cardiovascular: Normal rate, regular rhythm and normal heart sounds.  Pulmonary/Chest: Effort normal and breath sounds normal.  Abdominal: Soft. Bowel sounds are normal.  Neurological: He is alert and oriented to person, place, and time.  Skin: Skin is warm and dry.  Psychiatric: He has a normal mood and affect. His behavior is normal.     Results for orders placed or performed during the hospital encounter of 12/23/17  Urine Culture  Result Value Ref Range   Specimen Description      URINE, CLEAN CATCH Performed at [facility], [address] [place], [place] [address]    Special Requests      Normal Performed at [facility], [address] [place], [place] [address]    Culture >=100,000 COLONIES/mL SERRATIA MARCESCENS (A)    Report Status 12/26/2017 FINAL    Organism ID, Bacteria SERRATIA MARCESCENS (A)       Susceptibility   Serratia marcescens - MIC*    CEFAZOLIN >=64 RESISTANT Resistant     CEFTRIAXONE <=1 SENSITIVE Sensitive     CIPROFLOXACIN <=0.25 SENSITIVE Sensitive     GENTAMICIN <=1 SENSITIVE Sensitive     NITROFURANTOIN 128 RESISTANT Resistant     TRIMETH/SULFA <=20 SENSITIVE Sensitive     * >=100,000 COLONIES/mL SERRATIA MARCESCENS  Basic metabolic panel  Result Value Ref Range   Sodium 134 (L) 135 - 145 mmol/L   Potassium 4.1 3.5 - 5.1 mmol/L   Chloride 101 98 - 111 mmol/L   CO2 24 22 - 32 mmol/L   Glucose, Bld 122 (H) 70 - 99 mg/dL   BUN 26 (H) 8 - 23 mg/dL   Creatinine, Ser 1.76 (H) 0.61 - 1.24 mg/dL   Calcium 8.7 (L) 8.9 - 10.3 mg/dL   GFR calc non Af Amer 36 (L) >60 mL/min   GFR calc Af Amer 42 (L) >60 mL/min   Anion gap 9 5 - 15  CBC  Result Value Ref Range   WBC 8.6 3.8 - 10.6 K/uL   RBC 3.78 (L) 4.40 - 5.90 MIL/uL   Hemoglobin 11.9 (L) 13.0 - 18.0 g/dL   HCT 34.9 (L) 40.0 - 52.0 %   MCV 92.4 80.0 - 100.0 fL   MCH 31.5  26.0 - 34.0 pg   MCHC 34.1 32.0 - 36.0 g/dL   RDW 13.9 11.5 - 14.5 %   Platelets 227 150 - 440 K/uL  Urinalysis, Complete w Microscopic  Result Value Ref Range   Color, Urine YELLOW (A) YELLOW   APPearance TURBID (A) CLEAR   Specific Gravity, Urine 1.012 1.005 - 1.030   pH 5.0 5.0 - 8.0   Glucose, UA NEGATIVE NEGATIVE mg/dL   Hgb urine dipstick MODERATE (A) NEGATIVE   Bilirubin Urine NEGATIVE NEGATIVE   Ketones, ur NEGATIVE  NEGATIVE mg/dL   Protein, ur 100 (A) NEGATIVE mg/dL   Nitrite NEGATIVE NEGATIVE   Leukocytes, UA MODERATE (A) NEGATIVE   RBC / HPF >50 (H) 0 - 5 RBC/hpf   WBC, UA >50 (H) 0 - 5 WBC/hpf   Bacteria, UA MANY (A) NONE SEEN   Squamous Epithelial / LPF NONE SEEN 0 - 5  Lactic acid, plasma  Result Value Ref Range   Lactic Acid, Venous 1.7 0.5 - 1.9 mmol/L  Troponin I  Result Value Ref Range   Troponin I 0.03 (HH) <0.03 ng/mL  Brain natriuretic peptide - IF patient is dyspneic  Result Value Ref Range   B Natriuretic Peptide 59.0 0.0 - 100.0 pg/mL  Procalcitonin  Result Value Ref Range   Procalcitonin <0.10 ng/mL      Assessment & Plan:   Problem List Items Addressed This Visit      Cardiovascular and Mediastinum   Essential hypertension, benign    Well-controlled BP.  Continue current medication regimen.        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  Had all labs performed recently at [facility].  He has f/u on 17th with PCP at the [place] and she has requested he return to his 35 units (which he did 2 weeks ago) and they wished to recheck him on 17th and determine adjustments.  Have written down for patient to have information shared with PCP in office.  Recheck in 3 months. Continue to collaborate with [place] PCP and have written down request for patient to take with him to [place], for endo consult in house there.  May be able to assist with medication costs.      Relevant Orders   Bayer DCA Hb A1c  Waived (STAT)     Other   Hyperlipidemia    Lipid panel performed at [facility] recently, will attempt to obtain records.  Adjust Atorvastatin as needed.  Collaborate with [facility] PCP.          Follow up plan: Return in about 3 months (around 06/23/2018), or if symptoms worsen or fail to improve, for for T2DM and HTN.

## 2018-04-11 ENCOUNTER — Other Ambulatory Visit: Payer: Self-pay | Admitting: Unknown Physician Specialty

## 2018-04-14 ENCOUNTER — Telehealth: Payer: Self-pay | Admitting: Unknown Physician Specialty

## 2018-04-19 ENCOUNTER — Other Ambulatory Visit: Payer: Self-pay

## 2018-04-19 MED ORDER — OMEPRAZOLE 20 MG PO CPDR: 20.0000 mg | DELAYED_RELEASE_CAPSULE | Freq: Every day | ORAL | 3 refills | Status: DC

## 2018-04-19 NOTE — Telephone Encounter (Signed)
Patient last seen 03/23/18 and has appointment 06/23/18.

## 2018-04-19 NOTE — Telephone Encounter (Signed)
Refill request approved

## 2018-04-22 NOTE — Telephone Encounter (Signed)
Patient needs to know if Flomax will be approved by [facility]? Advised, atorvastatin, omeprazole and glipizide were approved already with [facility] and to call back if they don't have it.

## 2018-04-25 NOTE — Telephone Encounter (Signed)
Will order if he finds out [facility] needs refills.  Thanks

## 2018-04-27 ENCOUNTER — Telehealth: Payer: Self-pay | Admitting: Nurse Practitioner

## 2018-04-27 ENCOUNTER — Other Ambulatory Visit: Payer: Self-pay | Admitting: Nurse Practitioner

## 2018-04-27 MED ORDER — TAMSULOSIN HCL 0.4 MG PO CAPS: 0.4000 mg | ORAL_CAPSULE | Freq: Every day | ORAL | 5 refills | Status: DC

## 2018-04-27 NOTE — Telephone Encounter (Signed)
Script sent per request.

## 2018-04-27 NOTE — Progress Notes (Signed)
Patient requesting refill on Tamsulin be sent to [place].  Tamsulin script sent per request.

## 2018-04-27 NOTE — Telephone Encounter (Unsigned)
Copied from [place] [phone]. Topic: Quick Communication - Rx Refill/Question >> Apr 27, 2018  4:24 PM Carolyn Stare wrote: Medication    tamsulosin (FLOMAX) 0.4 MG CAPS capsule  Has the patient contacted their pharmacy yes   Preferred Pharmacy   [facility]   Agent: Please be advised that RX refills may take up to 3 business days. We ask that you follow-up with your pharmacy.

## 2018-05-03 DIAGNOSIS — M25632 Stiffness of left wrist, not elsewhere classified: Secondary | ICD-10-CM | POA: Diagnosis not present

## 2018-05-03 DIAGNOSIS — M25642 Stiffness of left hand, not elsewhere classified: Secondary | ICD-10-CM | POA: Diagnosis not present

## 2018-06-16 ENCOUNTER — Other Ambulatory Visit: Payer: Self-pay | Admitting: Unknown Physician Specialty

## 2018-06-23 ENCOUNTER — Ambulatory Visit: Payer: Medicare HMO | Admitting: Nurse Practitioner

## 2018-06-26 ENCOUNTER — Encounter: Payer: Self-pay | Admitting: Nurse Practitioner

## 2018-06-27 ENCOUNTER — Encounter: Payer: Self-pay | Admitting: Nurse Practitioner

## 2018-06-27 ENCOUNTER — Ambulatory Visit (INDEPENDENT_AMBULATORY_CARE_PROVIDER_SITE_OTHER): Payer: Medicare HMO | Admitting: Nurse Practitioner

## 2018-06-27 ENCOUNTER — Other Ambulatory Visit: Payer: Self-pay

## 2018-06-27 ENCOUNTER — Telehealth: Payer: Self-pay

## 2018-06-27 VITALS — BP 133/72 | HR 51 | Temp 98.2°F | Ht 69.0 in | Wt 230.0 lb

## 2018-06-27 DIAGNOSIS — E1122 Type 2 diabetes mellitus with diabetic chronic kidney disease: Secondary | ICD-10-CM | POA: Diagnosis not present

## 2018-06-27 DIAGNOSIS — E538 Deficiency of other specified B group vitamins: Secondary | ICD-10-CM

## 2018-06-27 DIAGNOSIS — N183 Chronic kidney disease, stage 3 (moderate): Secondary | ICD-10-CM

## 2018-06-27 DIAGNOSIS — IMO0002 Reserved for concepts with insufficient information to code with codable children: Secondary | ICD-10-CM

## 2018-06-27 DIAGNOSIS — C61 Malignant neoplasm of prostate: Secondary | ICD-10-CM

## 2018-06-27 DIAGNOSIS — K219 Gastro-esophageal reflux disease without esophagitis: Secondary | ICD-10-CM | POA: Diagnosis not present

## 2018-06-27 DIAGNOSIS — I1310 Hypertensive heart and chronic kidney disease without heart failure, with stage 1 through stage 4 chronic kidney disease, or unspecified chronic kidney disease: Secondary | ICD-10-CM

## 2018-06-27 DIAGNOSIS — E1165 Type 2 diabetes mellitus with hyperglycemia: Secondary | ICD-10-CM

## 2018-06-27 DIAGNOSIS — E785 Hyperlipidemia, unspecified: Secondary | ICD-10-CM | POA: Diagnosis not present

## 2018-06-27 DIAGNOSIS — Z794 Long term (current) use of insulin: Secondary | ICD-10-CM | POA: Diagnosis not present

## 2018-06-27 DIAGNOSIS — E1169 Type 2 diabetes mellitus with other specified complication: Secondary | ICD-10-CM | POA: Diagnosis not present

## 2018-06-27 LAB — LIPID PANEL PICCOLO, WAIVED
CHOL/HDL RATIO PICCOLO,WAIVE: 3.3 mg/dL
CHOLESTEROL PICCOLO, WAIVED: 169 mg/dL (ref <200)
HDL CHOL PICCOLO, WAIVED: 51 mg/dL — AB (ref >59)
LDL CHOL CALC PICCOLO WAIVED: 95 mg/dL (ref <100)
Triglycerides Piccolo,Waived: 111 mg/dL (ref <150)
VLDL Chol Calc Piccolo,Waive: 22 mg/dL (ref <30)

## 2018-06-27 LAB — BAYER DCA HB A1C WAIVED: HB A1C (BAYER DCA - WAIVED): 8.1 % — ABNORMAL HIGH (ref <7.0)

## 2018-06-27 NOTE — Assessment & Plan Note (Signed)
Followed at [facility] in [place], has appointment on 16th to discuss surgery options.

## 2018-06-27 NOTE — Assessment & Plan Note (Addendum)
Chronic, ongoing.  A1C at [facility] 8.1% and Glipizide was discontinued.  He is to continue on 35 units Lantus and Metformin per his [place] provider, he is going to trial going to 40 units but have advised him to monitor FSBS and if <70 frequently then return to 35 units.  A1C today continues to be 8.1%.  Collaborate with [facility] PCP.

## 2018-06-27 NOTE — Telephone Encounter (Signed)
Copied from [place] [id number]. Topic: General - Other >> Jun 24, 2018  1:25 PM Percell Belt A wrote: Reason for CRM:   Sarah with [facility] called in and needs to speak with someone to do a meds reconciliation   Best number  [phone] ext [phone] >> Jun 27, 2018  2:18 PM Reel, Rexene Edison, CMA wrote: Called and spoke with pharmacist, she states that she has tried to get patient to bring his medication in multiple times and that he will not. Went over current medications with her, she states that she will discuss with him again that it is difficult having two different PCP and makes med mgt difficult and dangerous.

## 2018-06-27 NOTE — Assessment & Plan Note (Addendum)
Chronic, stable with BP today 133/72.  Continue current medication regimen.  BMP next visit.

## 2018-06-27 NOTE — Assessment & Plan Note (Addendum)
Chronic, ongoing.  Lipid panel today: TCHOL 169 and LDL 95, AST/ALT 18/20.  Continue current medication regimen and adjust as needed.  Collaborate with [facility] PCP.

## 2018-06-27 NOTE — Progress Notes (Signed)
BP 133/72   Pulse (!) 51   Temp 98.2 F (36.8 C) (Oral)   Ht 5\' 9"  (1.753 m)   Wt 230 lb (104.3 kg)   SpO2 96%   BMI 33.97 kg/m    Subjective:    Patient ID: Scott Roof., male    DOB: 1940-08-28, 78 y.o.   MRN: [id number]  HPI: Scott Fassnacht. is a 78 y.o. male presents for follow-up  Chief Complaint  Patient presents with  . Diabetes    70m f/u  . Hypertension  . Hyperlipidemia   DIABETES Last A1C on 03/23/18 was 8.5%.  He continues on Metformin 1000 MG BID and Glipizide 10 MG daily (although reports he has not been taking this, as [facility] provider told him to stop this).  Also on Lantus 35 units every night.  A1C two weeks ago at the [place] was 8.1% (obtained A1C today prior to him telling provider this and A1C today ).  [facility] he is to continue his Lantus 35 units and Metformin 1000 MG.  Goes back to [place] on January 16th for prostate check and then follow-up with diabetes every three months. Hypoglycemic episodes:no Polydipsia/polyuria: no Visual disturbance: no Chest pain: no Paresthesias: no Glucose Monitoring: yes  Accucheck frequency: BID  Fasting glucose: 110 this morning (average 110-125)  Post prandial:  Evening: 200-240  Before meals: Taking Insulin?: yes  Long acting insulin: 35 units  Short acting insulin: Blood Pressure Monitoring: a few times a week Retinal Examination: Up to Date Foot Exam: Up to Date Pneumovax: Up to Date Influenza: Up to Date Aspirin: yes   HYPERTENSION / HYPERLIPIDEMIA Continues on Atorvastatin for HLD and Lisinopril-HCTZ for BP.   Satisfied with current treatment? yes Duration of hypertension: chronic BP monitoring frequency: a few times a week BP range: 120-130/60-70 BP medication side effects: no Duration of hyperlipidemia: chronic Cholesterol medication side effects: no Cholesterol supplements: none Medication compliance: good compliance Aspirin: yes Recent stressors: no Recurrent headaches:  no Visual changes: no Palpitations: no Dyspnea: no Chest pain: no Lower extremity edema: no Dizzy/lightheaded: no  GERD Continues on Omeprazole daily. GERD control status: controlled  Satisfied with current treatment? yes Heartburn frequency:  Medication side effects: no  Medication compliance: stable Previous GERD medications: Antacid use frequency:   Duration:  Nature:  Location:  Heartburn duration:  Alleviatiating factors:   Aggravating factors:  Dysphagia: no Odynophagia:  no Hematemesis: no Blood in stool: no EGD: no   Relevant past medical, surgical, family and social history reviewed and updated as indicated. Interim medical history since our last visit reviewed. Allergies and medications reviewed and updated.  Review of Systems  Constitutional: Negative for activity change, diaphoresis, fatigue and fever.  Respiratory: Negative for cough, chest tightness, shortness of breath and wheezing.   Cardiovascular: Negative for chest pain, palpitations and leg swelling.  Gastrointestinal: Negative for abdominal distention, abdominal pain, constipation, diarrhea, nausea and vomiting.  Endocrine: Negative for cold intolerance, heat intolerance, polydipsia, polyphagia and polyuria.  Musculoskeletal: Negative.   Skin: Negative.   Neurological: Negative for dizziness, syncope, weakness, light-headedness, numbness and headaches.  Psychiatric/Behavioral: Negative.     Per HPI unless specifically indicated above     Objective:    BP 133/72   Pulse (!) 51   Temp 98.2 F (36.8 C) (Oral)   Ht 5\' 9"  (1.753 m)   Wt 230 lb (104.3 kg)   SpO2 96%   BMI 33.97 kg/m   Wt Readings  from Last 3 Encounters:  06/27/18 230 lb (104.3 kg)  03/23/18 233 lb (105.7 kg)  12/23/17 232 lb (105.2 kg)    Physical Exam Vitals signs and nursing note reviewed.  Constitutional:      Appearance: He is well-developed.  HENT:     Head: Normocephalic and atraumatic.     Right Ear: Hearing  normal. No drainage.     Left Ear: Hearing normal. No drainage.     Mouth/Throat:     Pharynx: Uvula midline.  Eyes:     General: Lids are normal.        Right eye: No discharge.        Left eye: No discharge.     Conjunctiva/sclera: Conjunctivae normal.     Pupils: Pupils are equal, round, and reactive to light.  Neck:     Musculoskeletal: Normal range of motion and neck supple.     Thyroid: No thyromegaly.     Vascular: No carotid bruit or JVD.     Trachea: Trachea normal.  Cardiovascular:     Rate and Rhythm: Normal rate and regular rhythm.     Heart sounds: Normal heart sounds, S1 normal and S2 normal. No murmur. No gallop.   Pulmonary:     Effort: Pulmonary effort is normal.     Breath sounds: Normal breath sounds.  Abdominal:     General: Bowel sounds are normal.     Palpations: Abdomen is soft. There is no hepatomegaly or splenomegaly.  Musculoskeletal: Normal range of motion.     Right lower leg: No edema.     Left lower leg: No edema.  Skin:    General: Skin is warm and dry.     Capillary Refill: Capillary refill takes less than 2 seconds.     Findings: No rash.  Neurological:     Mental Status: He is alert and oriented to person, place, and time.     Deep Tendon Reflexes: Reflexes are normal and symmetric.  Psychiatric:        Mood and Affect: Mood normal.        Behavior: Behavior normal.        Thought Content: Thought content normal.        Judgment: Judgment normal.     Results for orders placed or performed in visit on 03/23/18  Bayer DCA Hb A1c Waived (STAT)  Result Value Ref Range   HB A1C (BAYER DCA - WAIVED) 8.5 (H) <7.0 %      Assessment & Plan:   Problem List Items Addressed This Visit      Cardiovascular and Mediastinum   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HCC)    Chronic, stable with BP today 133/72.  Continue current medication regimen.  BMP next visit.        Digestive   GERD (gastroesophageal reflux  disease)    Chronic, stable.  Continue current medication regimen.  Mag level next visit.      Relevant Orders   Magnesium     Endocrine   Hyperlipidemia associated with type 2 diabetes mellitus (HCC)    Chronic, ongoing.  Lipid panel today: TCHOL 169 and LDL 95, AST/ALT 18/20.  Continue current medication regimen and adjust as needed.  Collaborate with [facility] PCP.      Relevant Orders   Lipid Panel [place], Waived   Uncontrolled type 2 diabetes mellitus with chronic kidney disease ([place]) - Primary    Chronic, ongoing.  [facility] and Glipizide was discontinued.  He is to continue on 35 units Lantus and Metformin per his [place] provider, he is going to trial going to 40 units but have advised him to monitor FSBS and if <70 frequently then return to 35 units.  A1C today continues to be 8.1%.  Collaborate with [facility] PCP.      Relevant Orders   Bayer DCA Hb A1c Waived     Genitourinary   Prostate cancer ([place])    Followed at [facility] in [place], has appointment on 16th to discuss surgery options.       Other Visit Diagnoses    Vitamin B12 deficiency       H/O low B12 level and on chronic Metformin, check B12 level today.   Relevant Orders   Vitamin B12       Follow up plan: Return in about 3 months (around 09/26/2018) for T2DM and HTN/HLD.

## 2018-06-27 NOTE — Patient Instructions (Signed)
Carbohydrate Counting for Diabetes Mellitus, Adult  Carbohydrate counting is a method of keeping track of how many carbohydrates you eat. Eating carbohydrates naturally increases the amount of sugar (glucose) in the blood. Counting how many carbohydrates you eat helps keep your blood glucose within normal limits, which helps you manage your diabetes (diabetes mellitus). It is important to know how many carbohydrates you can safely have in each meal. This is different for every person. A diet and nutrition specialist (registered dietitian) can help you make a meal plan and calculate how many carbohydrates you should have at each meal and snack. Carbohydrates are found in the following foods:  Grains, such as breads and cereals.  Dried beans and soy products.  Starchy vegetables, such as potatoes, peas, and corn.  Fruit and fruit juices.  Milk and yogurt.  Sweets and snack foods, such as cake, cookies, candy, chips, and soft drinks. How do I count carbohydrates? There are two ways to count carbohydrates in food. You can use either of the methods or a combination of both. Reading "Nutrition Facts" on packaged food The "Nutrition Facts" list is included on the labels of almost all packaged foods and beverages in the [place] It includes:  The serving size.  Information about nutrients in each serving, including the grams (g) of carbohydrate per serving. To use the "Nutrition Facts":  Decide how many servings you will have.  Multiply the number of servings by the number of carbohydrates per serving.  The resulting number is the total amount of carbohydrates that you will be having. Learning standard serving sizes of other foods When you eat carbohydrate foods that are not packaged or do not include "Nutrition Facts" on the label, you need to measure the servings in order to count the amount of carbohydrates:  Measure the foods that you will eat with a food scale or measuring cup, if needed.   Decide how many standard-size servings you will eat.  Multiply the number of servings by 15. Most carbohydrate-rich foods have about 15 g of carbohydrates per serving. ? For example, if you eat 8 oz (170 g) of strawberries, you will have eaten 2 servings and 30 g of carbohydrates (2 servings x 15 g = 30 g).  For foods that have more than one food mixed, such as soups and casseroles, you must count the carbohydrates in each food that is included. The following list contains standard serving sizes of common carbohydrate-rich foods. Each of these servings has about 15 g of carbohydrates:   hamburger bun or  English muffin.   oz (15 mL) syrup.   oz (14 g) jelly.  1 slice of bread.  1 six-inch tortilla.  3 oz (85 g) cooked rice or pasta.  4 oz (113 g) cooked dried beans.  4 oz (113 g) starchy vegetable, such as peas, corn, or potatoes.  4 oz (113 g) hot cereal.  4 oz (113 g) mashed potatoes or  of a large baked potato.  4 oz (113 g) canned or frozen fruit.  4 oz (120 mL) fruit juice.  4-6 crackers.  6 chicken nuggets.  6 oz (170 g) unsweetened dry cereal.  6 oz (170 g) plain fat-free yogurt or yogurt sweetened with artificial sweeteners.  8 oz (240 mL) milk.  8 oz (170 g) fresh fruit or one small piece of fruit.  24 oz (680 g) popped popcorn. Example of carbohydrate counting Sample meal  3 oz (85 g) chicken breast.  6 oz (170 g)   brown rice.  4 oz (113 g) corn.  8 oz (240 mL) milk.  8 oz (170 g) strawberries with sugar-free whipped topping. Carbohydrate calculation 1. Identify the foods that contain carbohydrates: ? Rice. ? Corn. ? Milk. ? Strawberries. 2. Calculate how many servings you have of each food: ? 2 servings rice. ? 1 serving corn. ? 1 serving milk. ? 1 serving strawberries. 3. Multiply each number of servings by 15 g: ? 2 servings rice x 15 g = 30 g. ? 1 serving corn x 15 g = 15 g. ? 1 serving milk x 15 g = 15 g. ? 1 serving  strawberries x 15 g = 15 g. 4. Add together all of the amounts to find the total grams of carbohydrates eaten: ? 30 g + 15 g + 15 g + 15 g = 75 g of carbohydrates total. Summary  Carbohydrate counting is a method of keeping track of how many carbohydrates you eat.  Eating carbohydrates naturally increases the amount of sugar (glucose) in the blood.  Counting how many carbohydrates you eat helps keep your blood glucose within normal limits, which helps you manage your diabetes.  A diet and nutrition specialist (registered dietitian) can help you make a meal plan and calculate how many carbohydrates you should have at each meal and snack. This information is not intended to replace advice given to you by your health care provider. Make sure you discuss any questions you have with your health care provider. Document Released: 06/01/2005 Document Revised: 12/09/2016 Document Reviewed: 11/13/2015 Elsevier Interactive Patient Education  2019 Elsevier Inc.  

## 2018-06-27 NOTE — Assessment & Plan Note (Signed)
Chronic, stable.  Continue current medication regimen.  Mag level next visit.

## 2018-06-28 ENCOUNTER — Encounter: Payer: Self-pay | Admitting: Nurse Practitioner

## 2018-06-28 DIAGNOSIS — E538 Deficiency of other specified B group vitamins: Secondary | ICD-10-CM | POA: Insufficient documentation

## 2018-06-28 LAB — MAGNESIUM: MAGNESIUM: 1.8 mg/dL (ref 1.6–2.3)

## 2018-06-28 LAB — VITAMIN B12: VITAMIN B 12: 281 pg/mL (ref 232–1245)

## 2018-09-03 ENCOUNTER — Other Ambulatory Visit: Payer: Self-pay | Admitting: Nurse Practitioner

## 2018-09-09 ENCOUNTER — Telehealth: Payer: Self-pay

## 2018-09-13 ENCOUNTER — Telehealth: Payer: Self-pay

## 2018-09-14 ENCOUNTER — Telehealth: Payer: Self-pay

## 2018-09-20 ENCOUNTER — Other Ambulatory Visit: Payer: Self-pay | Admitting: Nurse Practitioner

## 2018-09-20 ENCOUNTER — Telehealth: Payer: Self-pay

## 2018-09-20 DIAGNOSIS — IMO0002 Reserved for concepts with insufficient information to code with codable children: Secondary | ICD-10-CM

## 2018-09-20 DIAGNOSIS — N183 Chronic kidney disease, stage 3 unspecified: Secondary | ICD-10-CM

## 2018-09-20 DIAGNOSIS — E538 Deficiency of other specified B group vitamins: Secondary | ICD-10-CM

## 2018-09-20 DIAGNOSIS — E1122 Type 2 diabetes mellitus with diabetic chronic kidney disease: Secondary | ICD-10-CM

## 2018-09-20 DIAGNOSIS — E1165 Type 2 diabetes mellitus with hyperglycemia: Secondary | ICD-10-CM

## 2018-09-20 NOTE — Progress Notes (Signed)
Lab orders only 

## 2018-09-22 ENCOUNTER — Ambulatory Visit: Payer: Self-pay | Admitting: *Deleted

## 2018-09-22 ENCOUNTER — Telehealth: Payer: Self-pay | Admitting: Nurse Practitioner

## 2018-09-22 NOTE — Telephone Encounter (Signed)
Pt reports BP yesterday 70-80's/60's. States came up to 90's/60's in afternoon, "After napping and drinking a lot of water." States "Just didn't feel well yesterday, weak." Reports mild lightheadedness at times yesterday, none today. States he takes 1/2 tab lisinipril-hydrochlorothiazide 20-12.5 in the mornings. (med profile notes 1 tab.) States he didn't take the 1/2 tab this AM and "I feel great." States BP this am 101/66 and 103/66. Unsure of HR. BP checked during call, 90/75   HR 83. Pt has home monitor, arm cuff. States is staying hydrated. Reports usual bp 120/66. Pt called after hours.Assured pt [place] would route to [facility]. [facility] and may be tomorrow until he is advised. Care advise given per protocol, verbalizes understanding. CB [phone]   Reason for Disposition . [7] Systolic BP 67-209 AND [4] taking blood pressure medications AND [3] NOT dizzy, lightheaded or weak  Answer Assessment - Initial Assessment Questions 1. BLOOD PRESSURE: "What is the blood pressure?" "Did you take at least two measurements 5 minutes apart?"     During call 90/75 2. ONSET: "When did you take your blood pressure?"    Yesterday am 80-70's/60's. This am 90's/60s. 3. HOW: "How did you obtain the blood pressure?" (e.g., visiting nurse, automatic home BP monitor)     Home monitor 4. HISTORY: "Do you have a history of low blood pressure?" "What is your blood pressure normally?"     Normal pressure 120/66 5. MEDICATIONS: "Are you taking any medications for blood pressure?" If yes: "Have they been changed recently?"     *No Answer* 6. PULSE RATE: "Do you know what your pulse rate is?"      80s 7. OTHER SYMPTOMS: "Have you been sick recently?" "Have you had a recent injury?"    no  Protocols used: LOW BLOOD PRESSURE-A-AH

## 2018-09-22 NOTE — Telephone Encounter (Signed)
Called pt to set up virtual visit for Monday no answer, voicemail not set up.

## 2018-09-23 ENCOUNTER — Other Ambulatory Visit: Payer: Self-pay

## 2018-09-23 ENCOUNTER — Other Ambulatory Visit: Payer: Medicare HMO

## 2018-09-23 ENCOUNTER — Other Ambulatory Visit: Payer: Self-pay | Admitting: Nurse Practitioner

## 2018-09-23 DIAGNOSIS — E1165 Type 2 diabetes mellitus with hyperglycemia: Secondary | ICD-10-CM | POA: Diagnosis not present

## 2018-09-23 DIAGNOSIS — IMO0002 Reserved for concepts with insufficient information to code with codable children: Secondary | ICD-10-CM

## 2018-09-23 DIAGNOSIS — E538 Deficiency of other specified B group vitamins: Secondary | ICD-10-CM | POA: Diagnosis not present

## 2018-09-23 DIAGNOSIS — N183 Chronic kidney disease, stage 3 unspecified: Secondary | ICD-10-CM

## 2018-09-23 DIAGNOSIS — E1122 Type 2 diabetes mellitus with diabetic chronic kidney disease: Secondary | ICD-10-CM

## 2018-09-23 LAB — MICROALBUMIN, URINE WAIVED
Creatinine, Urine Waived: 50 mg/dL (ref 10–300)
Microalb, Ur Waived: 30 mg/L — ABNORMAL HIGH (ref 0–19)

## 2018-09-23 LAB — BAYER DCA HB A1C WAIVED: HB A1C (BAYER DCA - WAIVED): 9 % — ABNORMAL HIGH (ref <7.0)

## 2018-09-23 MED ORDER — LISINOPRIL 20 MG PO TABS
20.0000 mg | ORAL_TABLET | Freq: Every day | ORAL | 3 refills | Status: DC
Start: 2018-09-23 — End: 2018-11-01

## 2018-09-23 NOTE — Telephone Encounter (Signed)
Spoke to patient on phone this morning, 09/23/2018.  BP 117/66 this morning.  On average he reports BP in 110-120/60-70 range, but had some lower ones "a couple days ago".  States at time he was not drinking as much fluid as he had been "feeling under the weather".  Reports feeling better this morning and he held BP medication this morning, to "be on safe side".  Recommend he hold BP medication today and recheck BP in morning, if within normal range can take medication tomorrow morning.  He denies any edema.  Denies SOB, CP, headache, dizziness.  Discussed with him, will change his BP medication to Lisinopril only as he is tightly controlled for age and reports no edema at baseline.  Recommended he continue to hydrate well, at least 240 cc every 4 hours.  If any change in symptoms he is to immediately present to ER for SOB, CP, dizziness.  He was able to verbalize plan of care back to provider.

## 2018-09-23 NOTE — Progress Notes (Signed)
Change from Lisinopril- HCTZ to Lisinopril only due to tight BP control with hypotensive episodes.

## 2018-09-24 LAB — COMPREHENSIVE METABOLIC PANEL
ALT: 15 IU/L (ref 0–44)
AST: 17 IU/L (ref 0–40)
Albumin/Globulin Ratio: 2.2 (ref 1.2–2.2)
Albumin: 4.9 g/dL — ABNORMAL HIGH (ref 3.7–4.7)
Alkaline Phosphatase: 83 IU/L (ref 39–117)
BUN/Creatinine Ratio: 27 — ABNORMAL HIGH (ref 10–24)
BUN: 37 mg/dL — ABNORMAL HIGH (ref 8–27)
Bilirubin Total: 0.4 mg/dL (ref 0.0–1.2)
CO2: 19 mmol/L — ABNORMAL LOW (ref 20–29)
Calcium: 9.7 mg/dL (ref 8.6–10.2)
Chloride: 102 mmol/L (ref 96–106)
Creatinine, Ser: 1.38 mg/dL — ABNORMAL HIGH (ref 0.76–1.27)
GFR calc Af Amer: 57 mL/min/{1.73_m2} — ABNORMAL LOW (ref >59)
GFR calc non Af Amer: 49 mL/min/{1.73_m2} — ABNORMAL LOW (ref >59)
Globulin, Total: 2.2 g/dL (ref 1.5–4.5)
Glucose: 91 mg/dL (ref 65–99)
Potassium: 5.3 mmol/L — ABNORMAL HIGH (ref 3.5–5.2)
Sodium: 139 mmol/L (ref 134–144)
Total Protein: 7.1 g/dL (ref 6.0–8.5)

## 2018-09-24 LAB — VITAMIN B12: Vitamin B-12: 440 pg/mL (ref 232–1245)

## 2018-09-26 ENCOUNTER — Encounter: Payer: Self-pay | Admitting: Nurse Practitioner

## 2018-09-26 ENCOUNTER — Other Ambulatory Visit: Payer: Self-pay

## 2018-09-26 ENCOUNTER — Ambulatory Visit (INDEPENDENT_AMBULATORY_CARE_PROVIDER_SITE_OTHER): Payer: Medicare HMO | Admitting: Nurse Practitioner

## 2018-09-26 VITALS — BP 122/60 | HR 52

## 2018-09-26 DIAGNOSIS — E785 Hyperlipidemia, unspecified: Secondary | ICD-10-CM

## 2018-09-26 DIAGNOSIS — E1165 Type 2 diabetes mellitus with hyperglycemia: Secondary | ICD-10-CM | POA: Diagnosis not present

## 2018-09-26 DIAGNOSIS — E1122 Type 2 diabetes mellitus with diabetic chronic kidney disease: Secondary | ICD-10-CM | POA: Diagnosis not present

## 2018-09-26 DIAGNOSIS — N183 Chronic kidney disease, stage 3 unspecified: Secondary | ICD-10-CM

## 2018-09-26 DIAGNOSIS — IMO0002 Reserved for concepts with insufficient information to code with codable children: Secondary | ICD-10-CM

## 2018-09-26 DIAGNOSIS — I1310 Hypertensive heart and chronic kidney disease without heart failure, with stage 1 through stage 4 chronic kidney disease, or unspecified chronic kidney disease: Secondary | ICD-10-CM

## 2018-09-26 DIAGNOSIS — E1169 Type 2 diabetes mellitus with other specified complication: Secondary | ICD-10-CM | POA: Diagnosis not present

## 2018-09-26 DIAGNOSIS — E538 Deficiency of other specified B group vitamins: Secondary | ICD-10-CM

## 2018-09-26 NOTE — Assessment & Plan Note (Signed)
Chronic, ongoing.  Continue current medication regimen.   

## 2018-09-26 NOTE — Assessment & Plan Note (Signed)
Chronic, ongoing.  Level improved to 440.  Continue current medication regimen.

## 2018-09-26 NOTE — Patient Instructions (Signed)
Carbohydrate Counting for Diabetes Mellitus, Adult  Carbohydrate counting is a method of keeping track of how many carbohydrates you eat. Eating carbohydrates naturally increases the amount of sugar (glucose) in the blood. Counting how many carbohydrates you eat helps keep your blood glucose within normal limits, which helps you manage your diabetes (diabetes mellitus). It is important to know how many carbohydrates you can safely have in each meal. This is different for every person. A diet and nutrition specialist (registered dietitian) can help you make a meal plan and calculate how many carbohydrates you should have at each meal and snack. Carbohydrates are found in the following foods:  Grains, such as breads and cereals.  Dried beans and soy products.  Starchy vegetables, such as potatoes, peas, and corn.  Fruit and fruit juices.  Milk and yogurt.  Sweets and snack foods, such as cake, cookies, candy, chips, and soft drinks. How do I count carbohydrates? There are two ways to count carbohydrates in food. You can use either of the methods or a combination of both. Reading "Nutrition Facts" on packaged food The "Nutrition Facts" list is included on the labels of almost all packaged foods and beverages in the [place] It includes:  The serving size.  Information about nutrients in each serving, including the grams (g) of carbohydrate per serving. To use the "Nutrition Facts":  Decide how many servings you will have.  Multiply the number of servings by the number of carbohydrates per serving.  The resulting number is the total amount of carbohydrates that you will be having. Learning standard serving sizes of other foods When you eat carbohydrate foods that are not packaged or do not include "Nutrition Facts" on the label, you need to measure the servings in order to count the amount of carbohydrates:  Measure the foods that you will eat with a food scale or measuring cup, if needed.   Decide how many standard-size servings you will eat.  Multiply the number of servings by 15. Most carbohydrate-rich foods have about 15 g of carbohydrates per serving. ? For example, if you eat 8 oz (170 g) of strawberries, you will have eaten 2 servings and 30 g of carbohydrates (2 servings x 15 g = 30 g).  For foods that have more than one food mixed, such as soups and casseroles, you must count the carbohydrates in each food that is included. The following list contains standard serving sizes of common carbohydrate-rich foods. Each of these servings has about 15 g of carbohydrates:   hamburger bun or  English muffin.   oz (15 mL) syrup.   oz (14 g) jelly.  1 slice of bread.  1 six-inch tortilla.  3 oz (85 g) cooked rice or pasta.  4 oz (113 g) cooked dried beans.  4 oz (113 g) starchy vegetable, such as peas, corn, or potatoes.  4 oz (113 g) hot cereal.  4 oz (113 g) mashed potatoes or  of a large baked potato.  4 oz (113 g) canned or frozen fruit.  4 oz (120 mL) fruit juice.  4-6 crackers.  6 chicken nuggets.  6 oz (170 g) unsweetened dry cereal.  6 oz (170 g) plain fat-free yogurt or yogurt sweetened with artificial sweeteners.  8 oz (240 mL) milk.  8 oz (170 g) fresh fruit or one small piece of fruit.  24 oz (680 g) popped popcorn. Example of carbohydrate counting Sample meal  3 oz (85 g) chicken breast.  6 oz (170 g)   brown rice.  4 oz (113 g) corn.  8 oz (240 mL) milk.  8 oz (170 g) strawberries with sugar-free whipped topping. Carbohydrate calculation 1. Identify the foods that contain carbohydrates: ? Rice. ? Corn. ? Milk. ? Strawberries. 2. Calculate how many servings you have of each food: ? 2 servings rice. ? 1 serving corn. ? 1 serving milk. ? 1 serving strawberries. 3. Multiply each number of servings by 15 g: ? 2 servings rice x 15 g = 30 g. ? 1 serving corn x 15 g = 15 g. ? 1 serving milk x 15 g = 15 g. ? 1 serving  strawberries x 15 g = 15 g. 4. Add together all of the amounts to find the total grams of carbohydrates eaten: ? 30 g + 15 g + 15 g + 15 g = 75 g of carbohydrates total. Summary  Carbohydrate counting is a method of keeping track of how many carbohydrates you eat.  Eating carbohydrates naturally increases the amount of sugar (glucose) in the blood.  Counting how many carbohydrates you eat helps keep your blood glucose within normal limits, which helps you manage your diabetes.  A diet and nutrition specialist (registered dietitian) can help you make a meal plan and calculate how many carbohydrates you should have at each meal and snack. This information is not intended to replace advice given to you by your health care provider. Make sure you discuss any questions you have with your health care provider. Document Released: 06/01/2005 Document Revised: 12/09/2016 Document Reviewed: 11/13/2015 Elsevier Interactive Patient Education  2019 Elsevier Inc.  

## 2018-09-26 NOTE — Assessment & Plan Note (Signed)
Chronic, ongoing.  HCTZ discontinued, continue Lisinopril 20 MG daily and adjust as needed based on BP range at home.  Recommended increase fluid intake.  Continue current medication regimen.

## 2018-09-26 NOTE — Progress Notes (Signed)
BP 122/60 Comment: pt reported- virtual visit  Pulse (!) 52 Comment: pt reported- virtual visit   Subjective:    Patient ID: Scott Roof., male    DOB: [birth year], 78 y.o.   MRN: [id number]  HPI: Scott Golubski. is a 78 y.o. male  Chief Complaint  Patient presents with  . Diabetes  . Hyperlipidemia  . Hypertension    . This visit was completed via telephone due to the restrictions of the COVID-19 pandemic. All issues as above were discussed and addressed but no physical exam was performed. If it was felt that the patient should be evaluated in the office, they were directed there. The patient verbally consented to this visit. Patient was unable to complete an audio/visual visit due to Lack of equipment. Due to the catastrophic nature of the COVID-19 pandemic, this visit was done through audio contact only. . Location of the patient: home . Location of the provider: home . Those involved with this call:  . Provider: Marnee Guarneri, DNP, AGPCNP-C . CMA: Yvonna Alanis, [place] . Front Desk/Registration: Don Perking  . Time spent on call: 15 minutes on the phone discussing health concerns. 10 minutes total spent in review of patient's record and preparation of their chart.   DIABETES Followed at the [facility] by PCP.  Currently on Metformin 1000 MG BID and Lantus 30 units (PCP at [facility] decreased him from 35 to 30).  Glipizide was discontinued in December by [place] provider, [facility] this was restarted to BID.  Last A1C 8.1%.  Last saw [place] PCP on 08/12/2018.  Next visit at [facility] is 12/06/2018.  A1C in house today 9.0%. Has tried Ozempic in the past, but this upset his belly. Hypoglycemic episodes:no Polydipsia/polyuria: no Visual disturbance: no Chest pain: no Paresthesias: no Glucose Monitoring: yes  Accucheck frequency: BID  Fasting glucose:127 this morning, past month or two has been 120-130 range  Post prandial:  Evening: 240-250  Before meals:  Taking Insulin?: yes  Long acting insulin: Lantus 30 units  Short acting insulin: Blood Pressure Monitoring: daily Retinal Examination: Up to Date Foot Exam: Not up to Date Pneumovax: Up to Date Influenza: Up to Date Aspirin: yes   HYPERTENSION / HYPERLIPIDEMIA Current medications include Lisinopril 20 MG for HTN and Atorvastatin for HLD.  HCTZ recently discontinued due to lower BP readings and patient no always consistent with oral fluid intake.  He denies edema to BLE. Satisfied with current treatment? yes Duration of hypertension: chronic BP monitoring frequency: daily BP range:  120/70 range BP medication side effects: no Duration of hyperlipidemia: chronic Cholesterol medication side effects: no Cholesterol supplements: none Medication compliance: good compliance Aspirin: yes Recent stressors: no Recurrent headaches: no Visual changes: no Palpitations: no Dyspnea: no Chest pain: no Lower extremity edema: no Dizzy/lightheaded: no   B12 DEFICIENCY: Continues on daily supplement with improvement in level from 281 to 440.     Relevant past medical, surgical, family and social history reviewed and updated as indicated. Interim medical history since our last visit reviewed. Allergies and medications reviewed and updated.  Review of Systems  Constitutional: Negative for activity change, diaphoresis, fatigue and fever.  Respiratory: Negative for cough, chest tightness, shortness of breath and wheezing.   Cardiovascular: Negative for chest pain, palpitations and leg swelling.  Gastrointestinal: Negative for abdominal distention, abdominal pain, constipation, diarrhea, nausea and vomiting.  Endocrine: Negative for cold intolerance, heat intolerance, polydipsia, polyphagia and polyuria.  Musculoskeletal: Negative.  Skin: Negative.   Neurological: Negative for dizziness, syncope, weakness, light-headedness, numbness and headaches.  Psychiatric/Behavioral: Negative.      Per HPI unless specifically indicated above     Objective:    BP 122/60 Comment: pt reported- virtual visit  Pulse (!) 52 Comment: pt reported- virtual visit  Wt Readings from Last 3 Encounters:  06/27/18 230 lb (104.3 kg)  03/23/18 233 lb (105.7 kg)  12/23/17 232 lb (105.2 kg)    Physical Exam   Unable to perform visual exam due to patient with no visual access.  Results for orders placed or performed in visit on 09/23/18  Comprehensive metabolic panel  Result Value Ref Range   Glucose 91 65 - 99 mg/dL   BUN 37 (H) 8 - 27 mg/dL   Creatinine, Ser 1.38 (H) 0.76 - 1.27 mg/dL   GFR calc non Af Amer 49 (L) >59 mL/min/1.73   GFR calc Af Amer 57 (L) >59 mL/min/1.73   BUN/Creatinine Ratio 27 (H) 10 - 24   Sodium 139 134 - 144 mmol/L   Potassium 5.3 (H) 3.5 - 5.2 mmol/L   Chloride 102 96 - 106 mmol/L   CO2 19 (L) 20 - 29 mmol/L   Calcium 9.7 8.6 - 10.2 mg/dL   Total Protein 7.1 6.0 - 8.5 g/dL   Albumin 4.9 (H) 3.7 - 4.7 g/dL   Globulin, Total 2.2 1.5 - 4.5 g/dL   Albumin/Globulin Ratio 2.2 1.2 - 2.2   Bilirubin Total 0.4 0.0 - 1.2 mg/dL   Alkaline Phosphatase 83 39 - 117 IU/L   AST 17 0 - 40 IU/L   ALT 15 0 - 44 IU/L  Microalbumin, Urine Waived  Result Value Ref Range   Microalb, Ur Waived 30 (H) 0 - 19 mg/L   Creatinine, Urine Waived 50 10 - 300 mg/dL   Microalb/Creat Ratio 30-300 (H) <30 mg/g  Vitamin B12  Result Value Ref Range   Vitamin B-12 440 232 - 1,245 pg/mL  Bayer DCA Hb A1c Waived  Result Value Ref Range   HB A1C (BAYER DCA - WAIVED) 9.0 (H) <7.0 %      Assessment & Plan:   Problem List Items Addressed This Visit      Cardiovascular and Mediastinum   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HCC)    Chronic, ongoing.  HCTZ discontinued, continue Lisinopril 20 MG daily and adjust as needed based on BP range at home.  Recommended increase fluid intake.  Continue current medication regimen.          Endocrine   Hyperlipidemia  associated with type 2 diabetes mellitus (HCC)    Chronic, ongoing.  Continue current medication regimen.        Uncontrolled type 2 diabetes mellitus with chronic kidney disease ([place]) - Primary    Chronic, ongoing.  A1C today 9.0%.  Is also followed by PCP at [facility] with recent medication adjustments.  Continue current medication regimen, as concern for hypoglycemia based on patient report.  CCM referral to discuss alternate options than Lantus and Glipizide combo.  Has trialed Ozempic in past with poor tolerance.  Attempt to obtain [facility] records.      Relevant Orders   Ambulatory referral to Chronic Care Management Services     Other   Vitamin B12 deficiency    Chronic, ongoing.  Level improved to 440.  Continue current medication regimen.           I discussed the assessment  and treatment plan with the patient. The patient was provided an opportunity to ask questions and all were answered. The patient agreed with the plan and demonstrated an understanding of the instructions.   The patient was advised to call back or seek an in-person evaluation if the symptoms worsen or if the condition fails to improve as anticipated.   Follow up plan: Return in about 3 months (around 12/26/2018) for T2DM, HTN/HLD.

## 2018-09-26 NOTE — Assessment & Plan Note (Signed)
Chronic, ongoing.  A1C today 9.0%.  Is also followed by PCP at [facility] with recent medication adjustments.  Continue current medication regimen, as concern for hypoglycemia based on patient report.  CCM referral to discuss alternate options than Lantus and Glipizide combo.  Has trialed Ozempic in past with poor tolerance.  Attempt to obtain [facility] records.

## 2018-09-28 ENCOUNTER — Telehealth: Payer: Self-pay

## 2018-09-30 ENCOUNTER — Ambulatory Visit: Payer: Medicare HMO | Admitting: Pharmacist

## 2018-09-30 DIAGNOSIS — IMO0002 Reserved for concepts with insufficient information to code with codable children: Secondary | ICD-10-CM

## 2018-09-30 DIAGNOSIS — E1165 Type 2 diabetes mellitus with hyperglycemia: Secondary | ICD-10-CM

## 2018-09-30 DIAGNOSIS — N183 Chronic kidney disease, stage 3 unspecified: Secondary | ICD-10-CM

## 2018-09-30 DIAGNOSIS — E1122 Type 2 diabetes mellitus with diabetic chronic kidney disease: Secondary | ICD-10-CM

## 2018-09-30 DIAGNOSIS — I1310 Hypertensive heart and chronic kidney disease without heart failure, with stage 1 through stage 4 chronic kidney disease, or unspecified chronic kidney disease: Secondary | ICD-10-CM

## 2018-09-30 NOTE — Patient Instructions (Signed)
Visit Information  Goals Addressed            This Visit's Progress     Patient Stated   . "I want to manage my diabetes as best I can" (pt-stated)       Current Barriers:  . Uncontrolled diabetes on non-optimal regimen o Last A1c 9.0% on 09/23/2018; patient reports previous AM hypoglycemia with Lantus 35 units daily, so [facility] reduced to 30 units daily. Takes along with metformin 500 mg BID and glipizide 10 mg BID o Reports FBG 120-130s, evening 240-260s, but this may be right after finishing supper o eGFR 49 mL/min (range 29-53 in the past year) o Patient reports history of GI upset (nausea, diarrhea) with Ozempic; per chart review, Ozempic had been discontinued due to cost  Pharmacist Clinical Goal(s):  Marland Kitchen Over the next 90 days, patient will work with PCP and CCM team to address needs related to optimized diabetes management  Interventions: . Comprehensive medication review performed.  . Discussed goal FBG <140 and HS <200 as more relaxed goals, d/t patient age and hx hypoglycemia. Appears that pharmacotherapy optimization should target prandial BG, as fastings are at goal . 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 Multiple options moving forward o Maximize metformin dosing- patient currently on 500 mg BID; however, will need to continue to monitor renal function o Start Trulicity 9.53 mg weekly - discussed with patient, he is amenable to trying an alternative GLP1. However, GI upset still a possibility o SGLT2, such as Jardiance. Again, however, will need to continue to monitor renal function. Patient previously with hypoglycemia on lisinopril-HCTZ combination therapy with reports of poor oral fluid intake, so it will be important that patient is able to remain well hydrated on SGLT2 therapy o Would like to avoid prandial insulin d/t risk of hypoglycemia, but still a last line  option  Patient Self Care Activities:  . Self administers medications as prescribed . Calls provider office for new concerns or questions  . Checks fasting BG daily, will start checking 2 hour post-supper instead of right after eating  Initial goal documentation        The patient verbalized understanding of instructions provided today and declined a print copy of patient instruction materials.   Plan: - PharmD will outreach patient next week to follow up on 2 hour post prandial BG, as well as BP. Will continue to collaborate with PCP to determine best steps for medication management  Catie Darnelle Maffucci, PharmD Clinical Pharmacist [place] [phone]

## 2018-09-30 NOTE — Chronic Care Management (AMB) (Signed)
Chronic Care Management   Note  09/30/2018 Name: Scott Lawson. MRN: [id number] DOB: 10-02-1940   Subjective:  Patient is a 78 year old male followed by Marnee Guarneri, NP for primary care services, referred to chronic care management for support in managing diabetes, hypertension, CKD, HLD, BPH, prostate cancer.   Telephonically outreached patient today to enroll in CCM program.    Scott Lawson was given information about Chronic Care Management services today including:  1. CCM service includes personalized support from designated clinical staff supervised by his physician, including individualized plan of care and coordination with other care providers 2. 24/7 contact phone numbers for assistance for urgent and routine care needs. 3. Service will only be billed when office clinical staff spend 20 minutes or more in a month to coordinate care. 4. Only one practitioner may furnish and bill the service in a calendar month. 5. The patient may stop CCM services at any time (effective at the end of the month) by phone call to the office staff. 6. The patient will be responsible for cost sharing (co-pay) of up to 20% of the service fee (after annual deductible is met).  Patient agreed to services and verbal consent obtained.   Review of patient status, including review of consultants reports, laboratory and other test data, was performed as part of comprehensive evaluation and provision of chronic care management services.   Objective: Lab Results  Component Value Date   CREATININE 1.38 (H) 09/23/2018   CREATININE 1.76 (H) 12/23/2017   CREATININE 1.30 (H) 12/14/2017    Lab Results  Component Value Date   HGBA1C 9.0 (H) 09/23/2018    Lipid Panel     Component Value Date/Time   CHOL 169 06/27/2018 0857   TRIG 111 06/27/2018 0857   HDL 48 06/02/2016 0923   VLDL 22 06/27/2018 0857   LDLCALC 121 (H) 06/02/2016 0923    BP Readings from Last 3 Encounters:  09/26/18 122/60   06/27/18 133/72  03/23/18 129/66    Allergies  Allergen Reactions  . Gabapentin Other (See Comments)    "made me fall out"    Medications Reviewed Today    Reviewed by De Hollingshead, [facility] (Pharmacist) on 09/30/18 at 1318  Med List Status: <None>  Medication Order Taking? Sig Documenting Provider Last Dose Status Informant  aspirin 81 MG tablet [id number] Yes Take 81 mg by mouth daily. [provider] Taking Active Self  atorvastatin (LIPITOR) 40 MG tablet [id number] Yes TAKE 1 TABLET EVERY DAY Cannady, Jolene T, NP Taking Active   fluticasone (FLONASE) 50 MCG/ACT nasal spray [id number] Yes Place 2 sprays into both nostrils daily as needed.  [provider] Taking Active Self  glipiZIDE (GLUCOTROL) 10 MG tablet [id number] Yes TAKE 1 TABLET TWICE DAILY BEFORE MEALS Cannady, Jolene T, NP Taking Active   glucose blood test strip [id number] Yes 1 each by Other route as needed for other. Use as instructed [provider] Taking Active Self  insulin glargine (LANTUS) 100 UNIT/ML injection [id number] Yes Inject 35 Units into the skin every evening. 1800 [provider] Taking Active Self           Med Note Darnelle Maffucci, Jackalyn Haith E   Fri Sep 30, 2018  1:11 PM) 30 units daily  lisinopril (PRINIVIL,ZESTRIL) 20 MG tablet [id number] Yes Take 1 tablet (20 mg total) by mouth daily. Marnee Guarneri T, NP Taking Active   meloxicam (MOBIC) 15 MG tablet [id number] Yes Take 15 mg by mouth daily. [provider] Taking Active Self  metFORMIN (GLUCOPHAGE) 500 MG tablet [id number] Yes TAKE 1 TABLET (500 MG TOTAL) BY MOUTH 2 (TWO) TIMES DAILY WITH A MEAL. Kathrine Haddock, NP Taking Active   omeprazole (PRILOSEC) 20 MG capsule [id number] Yes Take 1 capsule (20 mg total) by mouth daily. Marnee Guarneri T, NP Taking Active   tamsulosin (FLOMAX) 0.4 MG CAPS capsule [id number] Yes Take 1 capsule (0.4 mg total) by mouth daily. Venita Lick, NP Taking Active   Nolon Stalls  MISC [id number] Yes  [provider] Taking Active          Assessment: Goals Addressed            This Visit's Progress     Patient Stated   . "I want to manage my diabetes as best I can" (pt-stated)       Current Barriers:  . Uncontrolled diabetes on non-optimal regimen o Last A1c 9.0% on 09/23/2018; patient reports previous AM hypoglycemia with Lantus 35 units daily, so [facility] reduced to 30 units daily. Takes along with metformin 500 mg BID and glipizide 10 mg BID o Reports FBG 120-130s, evening 240-260s, but this may be right after finishing supper o eGFR 49 mL/min (range 29-53 in the past year) o Patient reports history of GI upset (nausea, diarrhea) with Ozempic; per chart review, Ozempic had been discontinued due to cost  Pharmacist Clinical Goal(s):  Marland Kitchen Over the next 90 days, patient will work with PCP and CCM team to address needs related to optimized diabetes management  Interventions: . Comprehensive medication review performed.  . Discussed goal FBG <140 and HS <200 as more relaxed goals, d/t patient age and hx hypoglycemia. Appears that pharmacotherapy optimization should target prandial BG, as fastings are at goal . 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 Multiple options moving forward o Maximize metformin dosing- patient currently on 500 mg BID; however, will need to continue to monitor renal function o Start Trulicity 9.74 mg weekly - discussed with patient, he is amenable to trying an alternative GLP1. However, GI upset still a possibility o SGLT2, such as Jardiance. Again, however, will need to continue to monitor renal function. Patient previously with hypoglycemia on lisinopril-HCTZ combination therapy with reports of poor oral fluid intake, so it will be important that patient is able to remain well hydrated on SGLT2 therapy o Would like to avoid  prandial insulin d/t risk of hypoglycemia, but still a last line option  Patient Self Care Activities:  . Self administers medications as prescribed . Calls provider office for new concerns or questions  . Checks fasting BG daily, will start checking 2 hour post-supper instead of right after eating  Initial goal documentation        Plan: - PharmD will outreach patient next week to follow up on 2 hour post prandial BG, as well as BP. Will continue to collaborate with PCP to determine best steps for medication management  Catie Darnelle Maffucci, PharmD Clinical Pharmacist [place] [phone]

## 2018-10-05 ENCOUNTER — Ambulatory Visit (INDEPENDENT_AMBULATORY_CARE_PROVIDER_SITE_OTHER): Payer: Medicare HMO | Admitting: Pharmacist

## 2018-10-05 DIAGNOSIS — E1122 Type 2 diabetes mellitus with diabetic chronic kidney disease: Secondary | ICD-10-CM

## 2018-10-05 DIAGNOSIS — I1310 Hypertensive heart and chronic kidney disease without heart failure, with stage 1 through stage 4 chronic kidney disease, or unspecified chronic kidney disease: Secondary | ICD-10-CM

## 2018-10-05 DIAGNOSIS — IMO0002 Reserved for concepts with insufficient information to code with codable children: Secondary | ICD-10-CM

## 2018-10-05 DIAGNOSIS — N183 Chronic kidney disease, stage 3 (moderate): Secondary | ICD-10-CM | POA: Diagnosis not present

## 2018-10-05 DIAGNOSIS — E1165 Type 2 diabetes mellitus with hyperglycemia: Secondary | ICD-10-CM

## 2018-10-05 NOTE — Patient Instructions (Signed)
Visit Information  Goals Addressed            This Visit's Progress     Patient Stated   . "I want to keep my blood pressure under control" (pt-stated)       Current Barriers:  . Patient reports of hypotension with combination lisinopril/HCTZ 20/12.5 mg. PCP Marnee Guarneri, NP discontinued HCTZ component. Patient notes that he received lisinopril 20 mg in the mail on 10/01/2018, so had not started lisinopril monotherapy until that day . Reports the following BP since d/c lisinopril/HCTZ 20/12.5 mg and starting lisinopril 20 mg  BP HR  4/18 AM 148/75 52  4/18 PM 162/60 61  4/20 AM 158/61 65  4/20 PM 160/60 59  4/21 AM 154/60 61   Pharmacist Clinical Goal(s):  Marland Kitchen Over the next 90 days, patient will work with PharmD and PCP to address needs related to safe and effective medication management of hypertension  Interventions: . Encouraged patient to continue to check BP periodically to evaluate change in regimen.  . Explained that goal BP <150 may be appropriate given age, comorbidities, so will be appropriate to continue to see effect of lisinopril 20 mg daily; may consider increasing dose moving forward if BP continues to be elevated  Patient Self Care Activities:  . Self administers medications as prescribed . Attends all scheduled provider appointments . Calls provider office for new concerns or questions  Initial goal documentation     . "I want to manage my diabetes as best I can" (pt-stated)       Current Barriers:  . Uncontrolled diabetes on non-optimal regimen o Last A1c 9.0% on 09/23/2018; patient reports previous AM hypoglycemia with Lantus 35 units daily, so [facility] reduced to 30 units daily. Takes along with metformin 500 mg BID and glipizide 10 mg BID o eGFR 49 mL/min (range 29-53 in the past year) o Patient reports history of GI upset (nausea, diarrhea) with Ozempic; per chart review, Ozempic had been discontinued due to cost o Notes that he has tried to focus on cutting  back on portion sizes with supper o Reports the following BG:   Fasting Post supper Time after supper   17-Apr 122 220 1.5 hr  18-Apr 130 240 1 hr  20-Apr 130 220   21-Apr 105 230 1.5 hr  22-Apr 105      Pharmacist Clinical Goal(s):  Marland Kitchen Over the next 90 days, patient will work with PCP and CCM team to address needs related to optimized diabetes management  Interventions: . Counseled on goal FBG and 2 hour post prandial BG; encouraged to continue to check and document BG to provide to healthcare team, as well as for his own review and personal accountability . Discussed potential for adding medications; patient would prefer to wait until next appointment with Marnee Guarneri, NP for any changes.   Patient Self Care Activities:  . Self administers medications as prescribed . Calls provider office for new concerns or questions  . Checks fasting and ~2 hour post prandial BG daily  Please see past updates related to this goal by clicking on the "Past Updates" button in the selected goal         The patient verbalized understanding of instructions provided today and declined a print copy of patient instruction materials.   Plan:  - PharmD will outreach patient in 3-4 weeks for continued follow up and support  Catie Darnelle Maffucci, PharmD Clinical Pharmacist [place] [phone]

## 2018-10-05 NOTE — Chronic Care Management (AMB) (Signed)
Chronic Care Management   Follow Up Note   10/05/2018 Name: Scott Lawson. MRN: [id number] DOB: 01-20-1941  Referred by: Venita Lick, NP Reason for referral : Chronic Care Management (Diabetes)   Scott Lawson. is a 78 y.o. year old male who is a primary care patient of Cannady, Barbaraann Faster, NP. The CCM team was consulted for assistance with chronic disease management and care coordination needs.    Patient contacted telephonically for f/u of BG and BP monitoring.   Review of patient status, including review of consultants reports, relevant laboratory and other test results, and collaboration with appropriate care team members and the patient's provider was performed as part of comprehensive patient evaluation and provision of chronic care management services.    Goals Addressed            This Visit's Progress     Patient Stated   . "I want to keep my blood pressure under control" (pt-stated)       Current Barriers:  . Patient reports of hypotension with combination lisinopril/HCTZ 20/12.5 mg. PCP Marnee Guarneri, NP discontinued HCTZ component. Patient notes that he received lisinopril 20 mg in the mail on 10/01/2018, so had not started lisinopril monotherapy until that day . Reports the following BP since d/c lisinopril/HCTZ 20/12.5 mg and starting lisinopril 20 mg  BP HR  4/18 AM 148/75 52  4/18 PM 162/60 61  4/20 AM 158/61 65  4/20 PM 160/60 59  4/21 AM 154/60 61   Pharmacist Clinical Goal(s):  Marland Kitchen Over the next 90 days, patient will work with PharmD and PCP to address needs related to safe and effective medication management of hypertension  Interventions: . Encouraged patient to continue to check BP periodically to evaluate change in regimen.  . Explained that goal BP <150 may be appropriate given age, comorbidities, so will be appropriate to continue to see effect of lisinopril 20 mg daily; may consider increasing dose moving forward if BP continues to be  elevated  Patient Self Care Activities:  . Self administers medications as prescribed . Attends all scheduled provider appointments . Calls provider office for new concerns or questions  Initial goal documentation     . "I want to manage my diabetes as best I can" (pt-stated)       Current Barriers:  . Uncontrolled diabetes on non-optimal regimen o Last A1c 9.0% on 09/23/2018; patient reports previous AM hypoglycemia with Lantus 35 units daily, so [facility] reduced to 30 units daily. Takes along with metformin 500 mg BID and glipizide 10 mg BID o eGFR 49 mL/min (range 29-53 in the past year) o Patient reports history of GI upset (nausea, diarrhea) with Ozempic; per chart review, Ozempic had been discontinued due to cost o Notes that he has tried to focus on cutting back on portion sizes with supper o Reports the following BG:   Fasting Post supper Time after supper   17-Apr 122 220 1.5 hr  18-Apr 130 240 1 hr  20-Apr 130 220   21-Apr 105 230 1.5 hr  22-Apr 105      Pharmacist Clinical Goal(s):  Marland Kitchen Over the next 90 days, patient will work with PCP and CCM team to address needs related to optimized diabetes management  Interventions: . Counseled on goal FBG and 2 hour post prandial BG; encouraged to continue to check and document BG to provide to healthcare team, as well as for his own review and personal accountability . Discussed potential  for adding medications; patient would prefer to wait until next appointment with Marnee Guarneri, NP for any changes.   Patient Self Care Activities:  . Self administers medications as prescribed . Calls provider office for new concerns or questions  . Checks fasting and ~2 hour post prandial BG daily  Please see past updates related to this goal by clicking on the "Past Updates" button in the selected goal          Plan:  - PharmD will outreach patient in 3-4 weeks for continued follow up and support  Catie Darnelle Maffucci, PharmD Clinical Pharmacist  [place] [phone]

## 2018-10-11 ENCOUNTER — Other Ambulatory Visit: Payer: Self-pay | Admitting: Unknown Physician Specialty

## 2018-10-21 ENCOUNTER — Other Ambulatory Visit: Payer: Self-pay | Admitting: Nurse Practitioner

## 2018-10-28 ENCOUNTER — Other Ambulatory Visit: Payer: Self-pay | Admitting: Nurse Practitioner

## 2018-11-01 ENCOUNTER — Other Ambulatory Visit: Payer: Self-pay | Admitting: Nurse Practitioner

## 2018-11-01 ENCOUNTER — Ambulatory Visit (INDEPENDENT_AMBULATORY_CARE_PROVIDER_SITE_OTHER): Payer: Medicare HMO | Admitting: Pharmacist

## 2018-11-01 DIAGNOSIS — N183 Chronic kidney disease, stage 3 unspecified: Secondary | ICD-10-CM

## 2018-11-01 DIAGNOSIS — E1165 Type 2 diabetes mellitus with hyperglycemia: Secondary | ICD-10-CM

## 2018-11-01 DIAGNOSIS — E1122 Type 2 diabetes mellitus with diabetic chronic kidney disease: Secondary | ICD-10-CM

## 2018-11-01 DIAGNOSIS — I1310 Hypertensive heart and chronic kidney disease without heart failure, with stage 1 through stage 4 chronic kidney disease, or unspecified chronic kidney disease: Secondary | ICD-10-CM | POA: Diagnosis not present

## 2018-11-01 DIAGNOSIS — IMO0002 Reserved for concepts with insufficient information to code with codable children: Secondary | ICD-10-CM

## 2018-11-01 MED ORDER — LISINOPRIL 10 MG PO TABS: 10.0000 mg | ORAL_TABLET | Freq: Every day | ORAL | 3 refills | Status: DC

## 2018-11-01 NOTE — Chronic Care Management (AMB) (Signed)
Chronic Care Management   Follow Up Note   11/01/2018 Name: Scott Lawson. MRN: [id number] DOB: 11-01-1940  Referred by: Venita Lick, NP Reason for referral : Chronic Care Management (Medication Management)   Scott Lawson. is a 78 y.o. year old male who is a primary care patient of Cannady, Barbaraann Faster, NP. The CCM team was consulted for assistance with chronic disease management and care coordination needs.    Contacted patient telephonically to discuss blood glucose and blood pressure control and management.   Review of patient status, including review of consultants reports, relevant laboratory and other test results, and collaboration with appropriate care team members and the patient's provider was performed as part of comprehensive patient evaluation and provision of chronic care management services.    Goals Addressed            This Visit's Progress     Patient Stated   . "I want to keep my blood pressure under control" (pt-stated)       Current Barriers:  . Patient reports of hypotension and recently changed the combination lisinopril/HCTZ 20/12.5 mg to lisinopril 20 mg only  . He reports having consistent SBP readings <100 and has discontinued his lisinopril 30m the past two days.  o He reporting having dizziness and lightheadedness when his SBP is <100 and manages this by resting, eating and drinking  19-May 130/71   18-May 136/62 discontinued lisinopril   2-May 136/62   25-Apr 121/55   26-Apr 96/60 taking lisinopril 280mdaily    Patient reports he drinks a pot and 1/2 of coffee daily   Patient reports a low sodium diet   Pharmacist Clinical Goal(s):  . Marland Kitchenver the next 90 days, patient will work with PharmD and PCP to address needs related to safe and effective medication management of hypertension  Interventions: . Will collaborate with PCP, JoMarnee GuarneriNP. Patient will benefit from low dose ACEi for renal protection due to CKD and  diabetes. Consider reducing lisinopril to 1038maily. Patient confirms he has a pill splitter and is amenable to taking 1/2 of the 46m57mblets he has on hand. . Encouraged patient to continue to check BP periodically to evaluate change in regimen.   Patient Self Care Activities:  . Self administers medications as prescribed . Attends all scheduled provider appointments . Calls provider office for new concerns or questions  Please see past updates related to this goal by clicking on the "Past Updates" button in the selected goal      . "I want to manage my diabetes as best I can" (pt-stated)       Current Barriers:  . Uncontrolled diabetes on non-optimal regimen o Last A1c 9.0% on 09/23/2018; patient reports previous AM hypoglycemia with Lantus 35 units daily, so [facility] reduced to 30 units daily and he continues on this dose. Takes along with metformin 500 mg BID and glipizide 10 mg BID o eGFR 49 mL/min  o He is able to tell changes in his blood glucose based on what he is eating. o Patient reports history of GI upset (nausea, diarrhea) with Ozempic; per chart review, Ozempic had been discontinued due to cost o Patient denies hypoglycemia and reports lowest blood glucose reading of 89 o Notes that he has continued to focus on cutting back on portion sizes with supper o Reports the following BG and is taking his blood glucose 2 hours after his evening meal:  fasting after dinner   24-Apr  130 200  26-Apr 130   27-Apr 115   28-Apr 120   29-Apr 130   2-May 130   10-May 120   average  125 200   Pharmacist Clinical Goal(s):  Marland Kitchen Over the next 90 days, patient will work with PCP and CCM team to address needs related to optimized diabetes management  Interventions: . Counseled on goal FBG and 2 hour post prandial BG; encouraged to continue to check and document BG to provide to healthcare team, as well as for his own review and personal accountability . Discussed hypoglycemia and how to treat.  Explained that glipizide could cause hypoglycemia and we may need to reduce the dose in the future if he has episodes of low blood glucose.  Marland Kitchen Discussed potential for adding medications; patient would prefer to wait until next appointment with Marnee Guarneri, NP for any changes.   Patient Self Care Activities:  . Self administers medications as prescribed . Calls provider office for new concerns or questions  . Checks fasting and ~2 hour post prandial BG daily . Report hypoglycemia to care team as his glipizide dose may need adjusted   Please see past updates related to this goal by clicking on the "Past Updates" button in the selected goal          Plan:  - PharmD will collaborate with PCP, Marnee Guarneri, NP to determine lisinopril dose  - PharmD will outreach patient in 3-4 weeks for continued follow up and support  Catie Darnelle Maffucci, PharmD Clinical Pharmacist [place] [phone]

## 2018-11-01 NOTE — Patient Instructions (Signed)
Visit Information  Goals Addressed            This Visit's Progress     Patient Stated   . "I want to keep my blood pressure under control" (pt-stated)       Current Barriers:  . Patient reports of hypotension and recently changed the combination lisinopril/HCTZ 20/12.5 mg to lisinopril 20 mg only  . He reports having consistent SBP readings <100 and has discontinued his lisinopril 15m the past two days.  o He reporting having dizziness and lightheadedness when his SBP is <100 and manages this by resting, eating and drinking  19-May 130/71   18-May 136/62 discontinued lisinopril   2-May 136/62   25-Apr 121/55   26-Apr 96/60 taking lisinopril 260mdaily    Patient reports he drinks a pot and 1/2 of coffee daily   Patient reports a low sodium diet   Pharmacist Clinical Goal(s):  . Marland Kitchenver the next 90 days, patient will work with PharmD and PCP to address needs related to safe and effective medication management of hypertension  Interventions: . Will collaborate with PCP, JoMarnee GuarneriNP. Patient will benefit from low dose ACEi for renal protection due to CKD and diabetes. Consider reducing lisinopril to 1057maily. Patient confirms he has a pill splitter and is amenable to taking 1/2 of the 58m77mblets he has on hand. . Encouraged patient to continue to check BP periodically to evaluate change in regimen.   Patient Self Care Activities:  . Self administers medications as prescribed . Attends all scheduled provider appointments . Calls provider office for new concerns or questions  Please see past updates related to this goal by clicking on the "Past Updates" button in the selected goal      . "I want to manage my diabetes as best I can" (pt-stated)       Current Barriers:  . Uncontrolled diabetes on non-optimal regimen o Last A1c 9.0% on 09/23/2018; patient reports previous AM hypoglycemia with Lantus 35 units daily, so [facility] to 30 units daily and he continues on this  dose. Takes along with metformin 500 mg BID and glipizide 10 mg BID o eGFR 49 mL/min  o He is able to tell changes in his blood glucose based on what he is eating. o Patient reports history of GI upset (nausea, diarrhea) with Ozempic; per chart review, Ozempic had been discontinued due to cost o Patient denies hypoglycemia and reports lowest blood glucose reading of 89 o Notes that he has continued to focus on cutting back on portion sizes with supper o Reports the following BG and is taking his blood glucose 2 hours after his evening meal:  fasting after dinner   24-Apr 130 200  26-Apr 130   27-Apr 115   28-Apr 120   29-Apr 130   2-May 130   10-May 120   average  125 200   Pharmacist Clinical Goal(s):  . OvMarland Kitchenr the next 90 days, patient will work with PCP and CCM team to address needs related to optimized diabetes management  Interventions: . Counseled on goal FBG and 2 hour post prandial BG; encouraged to continue to check and document BG to provide to healthcare team, as well as for his own review and personal accountability . Discussed hypoglycemia and how to treat. Explained that glipizide could cause hypoglycemia and we may need to reduce the dose in the future if he has episodes of low blood glucose.  . DiMarland Kitchencussed potential for adding medications;  patient would prefer to wait until next appointment with Marnee Guarneri, NP for any changes.   Patient Self Care Activities:  . Self administers medications as prescribed . Calls provider office for new concerns or questions  . Checks fasting and ~2 hour post prandial BG daily . Report hypoglycemia to care team as his glipizide dose may need adjusted   Please see past updates related to this goal by clicking on the "Past Updates" button in the selected goal         The patient verbalized understanding of instructions provided today and declined a print copy of patient instruction materials.   Plan:  - PharmD will collaborate with  PCP, Marnee Guarneri, NP to determine lisinopril dose  - PharmD will outreach patient in 3-4 weeks for continued follow up and support  Catie Darnelle Maffucci, PharmD Clinical Pharmacist [place] [phone]

## 2018-11-02 ENCOUNTER — Telehealth: Payer: Self-pay

## 2018-11-16 ENCOUNTER — Ambulatory Visit: Payer: Self-pay | Admitting: Pharmacist

## 2018-11-16 ENCOUNTER — Telehealth: Payer: Self-pay

## 2018-11-16 NOTE — Chronic Care Management (AMB) (Signed)
  Chronic Care Management   Note  11/16/2018 Name: Cranford Blessinger Defina [name] MRN: [id number] DOB: 1941/05/22  Princess Bruins Hansell [name] is a 78 y.o. year old male who is a primary care patient of Cannady, Barbaraann Faster, NP. The CCM team was consulted for assistance with chronic disease management and care coordination needs.    Unsuccessful telephonic outreach attempt to patient to discuss BP after recent lisinopril dose decrease. Left HIPAA compliant message for patient to return my call at his convenience.   Follow up plan: - If I do not hear back, will outreach again in 7-10 days  Catie Darnelle Maffucci, PharmD Clinical Pharmacist [place] [phone]

## 2018-11-21 ENCOUNTER — Ambulatory Visit (INDEPENDENT_AMBULATORY_CARE_PROVIDER_SITE_OTHER): Payer: Medicare HMO

## 2018-11-21 DIAGNOSIS — Z0000 Encounter for general adult medical examination without abnormal findings: Secondary | ICD-10-CM | POA: Diagnosis not present

## 2018-11-21 NOTE — Progress Notes (Signed)
Subjective:   Scott Lawson. is a 78 y.o. male who presents for Medicare Annual/Subsequent preventive examination.  This visit is being conducted via phone call  - after an attmept to do on video chat - due to the COVID-19 pandemic. This patient has given me verbal consent via phone to conduct this visit, patient states they are participating from their home address. Some vital signs may be absent or patient reported.   Patient identification: identified by name, DOB, and current address.    Review of Systems:   Cardiac Risk Factors include: advanced age (>79mn, >>19women);hypertension;diabetes mellitus;dyslipidemia;male gender     Objective:    Vitals: There were no vitals taken for this visit.  There is no height or weight on file to calculate BMI.  Advanced Directives 11/21/2018 12/23/2017 11/19/2017 08/02/2017 07/29/2017 11/18/2016 12/02/2015  Does Patient Have a Medical Advance Directive? No No No No No Yes Yes  Type of Advance Directive - - - - - HPress photographerLiving will Living will  Does patient want to make changes to medical advance directive? - - Yes (MAU/Ambulatory/Procedural Areas - Information given) - - - -  Copy of Healthcare Power of Attorney in Chart? - - - - - No - copy requested No - copy requested  Would patient like information on creating a medical advance directive? - No - Patient declined - No - Patient declined No - Patient declined - -    Tobacco Social History   Tobacco Use  Smoking Status Never Smoker  Smokeless Tobacco Never Used     Counseling given: Not Answered   Clinical Intake:  Pre-visit preparation completed: Yes  Pain : No/denies pain     Nutritional Risks: None Diabetes: No  How often do you need to have someone help you when you read instructions, pamphlets, or other written materials from your doctor or pharmacy?: 1 - Never What is the last grade level you completed in school?: 12th  Nutrition Risk Assessment:   Has the patient had any N/V/D within the last 2 months?  No  Does the patient have any non-healing wounds?  No  Has the patient had any unintentional weight loss or weight gain?  No   Diabetes:  Is the patient diabetic?  Yes  If diabetic, was a CBG obtained today?  No  Did the patient bring in their glucometer from home?  n/a How often do you monitor your CBG's? Morning and evening.   Financial Strains and Diabetes Management:  Are you having any financial strains with the device, your supplies or your medication? No .  Does the patient want to be seen by Chronic Care Management for management of their diabetes?  Yes  Would the patient like to be referred to a Nutritionist or for Diabetic Management?  No   Diabetic Exams:  Diabetic Eye Exam:Overdue for diabetic eye exam.   Diabetic Foot Exam:  Pt has been advised about the importance in completing this exam.   Interpreter Needed?: No  Information entered by :: Robina Hamor,LPN  Past Medical History:  Diagnosis Date  . BPH (benign prostatic hyperplasia)   . Diabetes mellitus without complication ([place]   . Elevated PSA   . GERD (gastroesophageal reflux disease)   . Hypertension   . Hypertriglyceridemia   . Nocturia    Past Surgical History:  Procedure Laterality Date  . APPENDECTOMY    . CARPAL TUNNEL RELEASE Left 12/11/2015   Procedure: CARPAL TUNNEL RELEASE;  Surgeon:  Earnestine Leys, MD;  Location: [facility] ORS;  Service: Orthopedics;  Laterality: Left;  . EYE SURGERY     laser  . TRANSURETHRAL RESECTION OF PROSTATE N/A 08/02/2017   Procedure: TRANSURETHRAL RESECTION OF THE PROSTATE (TURP);  Surgeon: Hollice Espy, MD;  Location: [facility] ORS;  Service: Urology;  Laterality: N/A;   Family History  Problem Relation Age of Onset  . Cancer Mother        lung  . Heart disease Father   . Diabetes Brother   . Prostate cancer Neg Hx   . Bladder Cancer Neg Hx   . Kidney cancer Neg Hx    Social History   Socioeconomic  History  . Marital status: Married    Spouse name: Not on file  . Number of children: Not on file  . Years of education: Not on file  . Highest education level: High school graduate  Occupational History  . Occupation: retired  Scientific laboratory technician  . Financial resource strain: Not hard at all  . Food insecurity:    Worry: Never true    Inability: Never true  . Transportation needs:    Medical: No    Non-medical: No  Tobacco Use  . Smoking status: Never Smoker  . Smokeless tobacco: Never Used  Substance and Sexual Activity  . Alcohol use: No    Alcohol/week: 0.0 standard drinks  . Drug use: No  . Sexual activity: Yes  Lifestyle  . Physical activity:    Days per week: 0 days    Minutes per session: 0 min  . Stress: Not at all  Relationships  . Social connections:    Talks on phone: More than three times a week    Gets together: More than three times a week    Attends religious service: More than 4 times per year    Active member of club or organization: No    Attends meetings of clubs or organizations: Never    Relationship status: Widowed  Other Topics Concern  . Not on file  Social History Narrative  . Not on file    Outpatient Encounter Medications as of 11/21/2018  Medication Sig  . aspirin 81 MG tablet Take 81 mg by mouth daily.  Marland Kitchen atorvastatin (LIPITOR) 40 MG tablet TAKE 1 TABLET EVERY DAY  . fluticasone (FLONASE) 50 MCG/ACT nasal spray Place 2 sprays into both nostrils daily as needed.   Marland Kitchen glipiZIDE (GLUCOTROL) 10 MG tablet TAKE 1 TABLET TWICE DAILY BEFORE MEALS  . insulin glargine (LANTUS) 100 UNIT/ML injection Inject 35 Units into the skin every evening. 1800  . lisinopril (ZESTRIL) 10 MG tablet Take 1 tablet (10 mg total) by mouth daily.  . meloxicam (MOBIC) 15 MG tablet Take 15 mg by mouth daily.  . metFORMIN (GLUCOPHAGE) 500 MG tablet TAKE 1 TABLET (500 MG TOTAL) BY MOUTH 2 (TWO) TIMES DAILY WITH A MEAL.  Marland Kitchen omeprazole (PRILOSEC) 20 MG capsule Take 1 capsule (20  mg total) by mouth daily.  . tamsulosin (FLOMAX) 0.4 MG CAPS capsule TAKE 1 CAPSULE EVERY DAY  . TRUE METRIX BLOOD GLUCOSE TEST test strip CHECK BLOOD SUGAR TWICE DAILY  . TRUEplus Lancets 33G MISC CHECK BLOOD SUGAR TWICE DAILY   No facility-administered encounter medications on file as of 11/21/2018.     Activities of Daily Living In your present state of health, do you have any difficulty performing the following activities: 11/21/2018  Hearing? Y  Comment right ear hearing loss   Vision? N  Difficulty concentrating  or making decisions? N  Walking or climbing stairs? N  Dressing or bathing? N  Doing errands, shopping? N  Preparing Food and eating ? N  Using the Toilet? N  In the past six months, have you accidently leaked urine? N  Do you have problems with loss of bowel control? N  Managing your Medications? N  Managing your Finances? N  Housekeeping or managing your Housekeeping? N  Some recent data might be hidden    Patient Care Team: Venita Lick, NP as PCP - General (Nurse Practitioner) Hollice Espy, MD as Consulting Physician (Urology) De Hollingshead, [facility] as Pharmacist   Assessment:   This is a routine wellness examination for Scott Lawson.  Exercise Activities and Dietary recommendations Current Exercise Habits: The patient has a physically strenuous job, but has no regular exercise apart from work., Exercise limited by: None identified  Goals    . "I want to keep my blood pressure under control" (pt-stated)     Current Barriers:  . Patient reports of hypotension and recently changed the combination lisinopril/HCTZ 20/12.5 mg to lisinopril 20 mg only  . He reports having consistent SBP readings <100 and has discontinued his lisinopril 40m the past two days.  o He reporting having dizziness and lightheadedness when his SBP is <100 and manages this by resting, eating and drinking  19-May 130/71   18-May 136/62 discontinued lisinopril   2-May 136/62   25-Apr  121/55   26-Apr 96/60 taking lisinopril 254mdaily    Patient reports he drinks a pot and 1/2 of coffee daily   Patient reports a low sodium diet   Pharmacist Clinical Goal(s):  . Marland Kitchenver the next 90 days, patient will work with PharmD and PCP to address needs related to safe and effective medication management of hypertension  Interventions: . Will collaborate with PCP, JoMarnee GuarneriNP. Patient will benefit from low dose ACEi for renal protection due to CKD and diabetes. Consider reducing lisinopril to 1082maily. Patient confirms he has a pill splitter and is amenable to taking 1/2 of the 5m30mblets he has on hand. . Encouraged patient to continue to check BP periodically to evaluate change in regimen.   Patient Self Care Activities:  . Self administers medications as prescribed . Attends all scheduled provider appointments . Calls provider office for new concerns or questions  Please see past updates related to this goal by clicking on the "Past Updates" button in the selected goal      . "I want to manage my diabetes as best I can" (pt-stated)     Current Barriers:  . Uncontrolled diabetes on non-optimal regimen o Last A1c 9.0% on 09/23/2018; patient reports previous AM hypoglycemia with Lantus 35 units daily, so [facility] reduced to 30 units daily and he continues on this dose. Takes along with metformin 500 mg BID and glipizide 10 mg BID o eGFR 49 mL/min  o He is able to tell changes in his blood glucose based on what he is eating. o Patient reports history of GI upset (nausea, diarrhea) with Ozempic; per chart review, Ozempic had been discontinued due to cost o Patient denies hypoglycemia and reports lowest blood glucose reading of 89 o Notes that he has continued to focus on cutting back on portion sizes with supper o Reports the following BG and is taking his blood glucose 2 hours after his evening meal:  fasting after dinner   24-Apr 130 200  26-Apr 130   27-Apr 115  28-Apr  120   29-Apr 130   2-May 130   10-May 120   average  125 200   Pharmacist Clinical Goal(s):  Marland Kitchen Over the next 90 days, patient will work with PCP and CCM team to address needs related to optimized diabetes management  Interventions: . Counseled on goal FBG and 2 hour post prandial BG; encouraged to continue to check and document BG to provide to healthcare team, as well as for his own review and personal accountability . Discussed hypoglycemia and how to treat. Explained that glipizide could cause hypoglycemia and we may need to reduce the dose in the future if he has episodes of low blood glucose.  Marland Kitchen Discussed potential for adding medications; patient would prefer to wait until next appointment with Marnee Guarneri, NP for any changes.   Patient Self Care Activities:  . Self administers medications as prescribed . Calls provider office for new concerns or questions  . Checks fasting and ~2 hour post prandial BG daily . Report hypoglycemia to care team as his glipizide dose may need adjusted   Please see past updates related to this goal by clicking on the "Past Updates" button in the selected goal      . DIET - INCREASE WATER INTAKE     Recommend drinking at least 6-8 glasses of water a day     . Increase water intake     Recommend to continue drinking at least 4-5 glasses of water a day.        Fall Risk Fall Risk  11/21/2018 11/19/2017 11/18/2016 06/02/2016 06/12/2015  Falls in the past year? 0 No Yes Yes No  Number falls in past yr: - - 1 1 -  Injury with Fall? - - Yes Yes -  Comment - - - broken wrist -  Follow up - - Falls prevention discussed - -   FALL RISK PREVENTION PERTAINING TO THE HOME:  Any stairs in or around the home? Yes  If so, are there any without handrails? No   Home free of loose throw rugs in walkways, pet beds, electrical cords, etc? Yes  Adequate lighting in your home to reduce risk of falls? Yes   ASSISTIVE DEVICES UTILIZED TO PREVENT FALLS:  Life  alert? No  Use of a cane, walker or w/c? No  Grab bars in the bathroom? No  Shower chair or bench in shower? No  Elevated toilet seat or a handicapped toilet? No    TIMED UP AND GO:  Unable to perform    Depression Screen PHQ 2/9 Scores 11/21/2018 11/19/2017 11/18/2016 11/18/2016  PHQ - 2 Score 1 0 0 0  PHQ- 9 Score - - 0 -    Cognitive Function     6CIT Screen 11/21/2018 11/19/2017 11/18/2016  What Year? 0 points 0 points 0 points  What month? 0 points 0 points 0 points  What time? 0 points 0 points 0 points  Count back from 20 0 points 0 points 0 points  Months in reverse 0 points 0 points 0 points  Repeat phrase 0 points 2 points 4 points  Total Score 0 2 4    Immunization History  Administered Date(s) Administered  . Influenza, High Dose Seasonal PF 03/03/2016, 03/08/2017  . Influenza,inj,Quad PF,6+ Mos 05/15/2015  . Influenza-Unspecified 02/19/2018  . Pneumococcal Conjugate-13 10/16/2013, 04/15/2017  . Pneumococcal Polysaccharide-23 12/06/2012  . Tdap 11/23/2013    Qualifies for Shingles Vaccine? Yes  Zostavax- not completed  Due for Shingrix. Education has  been provided regarding the importance of this vaccine. Pt has been advised to call insurance company to determine out of pocket expense. Advised may also receive vaccine at local pharmacy or Health Dept. Verbalized acceptance and understanding.  Tdap: up to date   Flu Vaccine: up to date   Pneumococcal Vaccine: up to date   Screening Tests Health Maintenance  Topic Date Due  . FOOT EXAM  07/16/2018  . OPHTHALMOLOGY EXAM  09/28/2018  . INFLUENZA VACCINE  01/14/2019  . HEMOGLOBIN A1C  03/25/2019  . TETANUS/TDAP  11/24/2023  . PNA vac Low Risk Adult  Completed   Cancer Screenings:  Colorectal Screening: no longer required  Lung Cancer Screening: (Low Dose CT Chest recommended if Age 58-80 years, 30 pack-year currently smoking OR have quit w/in 15years.) does not qualify.   Additional Screening:  Hepatitis C  Screening: does not qualify  Dental Screening: Recommended annual dental exams for proper oral hygiene  Community Resource Referral:  CRR required this visit?  No        Plan:  I have personally reviewed and addressed the Medicare Annual Wellness questionnaire and have noted the following in the patient's chart:  A. Medical and social history B. Use of alcohol, tobacco or illicit drugs  C. Current medications and supplements D. Functional ability and status E.  Nutritional status F.  Physical activity G. Advance directives H. List of other physicians I.  Hospitalizations, surgeries, and ER visits in previous 12 months J.  [place] such as hearing and vision if needed, cognitive and depression L. Referrals and appointments   In addition, I have reviewed and discussed with patient certain preventive protocols, quality metrics, and best practice recommendations. A written personalized care plan for preventive services as well as general preventive health recommendations were provided to patient.   Signed,   Bevelyn Ngo, LPN  06/19/9299 Nurse Health Advisor  Nurse Notes: none

## 2018-11-21 NOTE — Patient Instructions (Signed)
Scott Lawson , Thank you for taking time to come for your Medicare Wellness Visit. I appreciate your ongoing commitment to your health goals. Please review the following plan we discussed and let me know if I can assist you in the future.   Screening recommendations/referrals: Colonoscopy: no longer required Recommended yearly ophthalmology/optometry visit for glaucoma screening and checkup Recommended yearly dental visit for hygiene and checkup  Vaccinations: Influenza vaccine: up to date Pneumococcal vaccine: up to date Tdap vaccine: up to date Shingles vaccine: shingrix eligible, check with your insurance company for coverage    Advanced directives: please pick up a copy of the paperwork at our office at your convienence.   Conditions/risks identified: diabetic- in contact with chronic care management team  Next appointment: follow up in one year for your annual wellness exam.   Preventive Care 65 Years and Older, Male Preventive care refers to lifestyle choices and visits with your health care provider that can promote health and wellness. What does preventive care include?  A yearly physical exam. This is also called an annual well check.  Dental exams once or twice a year.  Routine eye exams. Ask your health care provider how often you should have your eyes checked.  Personal lifestyle choices, including:  Daily care of your teeth and gums.  Regular physical activity.  Eating a healthy diet.  Avoiding tobacco and drug use.  Limiting alcohol use.  Practicing safe sex.  Taking low doses of aspirin every day.  Taking vitamin and mineral supplements as recommended by your health care provider. What happens during an annual well check? The services and screenings done by your health care provider during your annual well check will depend on your age, overall health, lifestyle risk factors, and family history of disease. Counseling  Your health care provider may ask  you questions about your:  Alcohol use.  Tobacco use.  Drug use.  Emotional well-being.  Home and relationship well-being.  Sexual activity.  Eating habits.  History of falls.  Memory and ability to understand (cognition).  Work and work Statistician. Screening  You may have the following tests or measurements:  Height, weight, and BMI.  Blood pressure.  Lipid and cholesterol levels. These may be checked every 5 years, or more frequently if you are over 29 years old.  Skin check.  Lung cancer screening. You may have this screening every year starting at age 32 if you have a 30-pack-year history of smoking and currently smoke or have quit within the past 15 years.  Fecal occult blood test (FOBT) of the stool. You may have this test every year starting at age 55.  Flexible sigmoidoscopy or colonoscopy. You may have a sigmoidoscopy every 5 years or a colonoscopy every 10 years starting at age 25.  Prostate cancer screening. Recommendations will vary depending on your family history and other risks.  Hepatitis C blood test.  Hepatitis B blood test.  Sexually transmitted disease (STD) testing.  Diabetes screening. This is done by checking your blood sugar (glucose) after you have not eaten for a while (fasting). You may have this done every 1-3 years.  Abdominal aortic aneurysm (AAA) screening. You may need this if you are a current or former smoker.  Osteoporosis. You may be screened starting at age 53 if you are at high risk. Talk with your health care provider about your test results, treatment options, and if necessary, the need for more tests. Vaccines  Your health care provider may recommend certain  vaccines, such as:  Influenza vaccine. This is recommended every year.  Tetanus, diphtheria, and acellular pertussis (Tdap, Td) vaccine. You may need a Td booster every 10 years.  Zoster vaccine. You may need this after age 3.  Pneumococcal 13-valent conjugate  (PCV13) vaccine. One dose is recommended after age 76.  Pneumococcal polysaccharide (PPSV23) vaccine. One dose is recommended after age 48. Talk to your health care provider about which screenings and vaccines you need and how often you need them. This information is not intended to replace advice given to you by your health care provider. Make sure you discuss any questions you have with your health care provider. Document Released: 06/28/2015 Document Revised: 02/19/2016 Document Reviewed: 04/02/2015 Elsevier Interactive Patient Education  2017 [place] Prevention in the Home Falls can cause injuries. They can happen to people of all ages. There are many things you can do to make your home safe and to help prevent falls. What can I do on the outside of my home?  Regularly fix the edges of walkways and driveways and fix any cracks.  Remove anything that might make you trip as you walk through a door, such as a raised step or threshold.  Trim any bushes or trees on the path to your home.  Use bright outdoor lighting.  Clear any walking paths of anything that might make someone trip, such as rocks or tools.  Regularly check to see if handrails are loose or broken. Make sure that both sides of any steps have handrails.  Any raised decks and porches should have guardrails on the edges.  Have any leaves, snow, or ice cleared regularly.  Use sand or salt on walking paths during winter.  Clean up any spills in your garage right away. This includes oil or grease spills. What can I do in the bathroom?  Use night lights.  Install grab bars by the toilet and in the tub and shower. Do not use towel bars as grab bars.  Use non-skid mats or decals in the tub or shower.  If you need to sit down in the shower, use a plastic, non-slip stool.  Keep the floor dry. Clean up any water that spills on the floor as soon as it happens.  Remove soap buildup in the tub or shower  regularly.  Attach bath mats securely with double-sided non-slip rug tape.  Do not have throw rugs and other things on the floor that can make you trip. What can I do in the bedroom?  Use night lights.  Make sure that you have a light by your bed that is easy to reach.  Do not use any sheets or blankets that are too big for your bed. They should not hang down onto the floor.  Have a firm chair that has side arms. You can use this for support while you get dressed.  Do not have throw rugs and other things on the floor that can make you trip. What can I do in the kitchen?  Clean up any spills right away.  Avoid walking on wet floors.  Keep items that you use a lot in easy-to-reach places.  If you need to reach something above you, use a strong step stool that has a grab bar.  Keep electrical cords out of the way.  Do not use floor polish or wax that makes floors slippery. If you must use wax, use non-skid floor wax.  Do not have throw rugs and other things  on the floor that can make you trip. What can I do with my stairs?  Do not leave any items on the stairs.  Make sure that there are handrails on both sides of the stairs and use them. Fix handrails that are broken or loose. Make sure that handrails are as long as the stairways.  Check any carpeting to make sure that it is firmly attached to the stairs. Fix any carpet that is loose or worn.  Avoid having throw rugs at the top or bottom of the stairs. If you do have throw rugs, attach them to the floor with carpet tape.  Make sure that you have a light switch at the top of the stairs and the bottom of the stairs. If you do not have them, ask someone to add them for you. What else can I do to help prevent falls?  Wear shoes that:  Do not have high heels.  Have rubber bottoms.  Are comfortable and fit you well.  Are closed at the toe. Do not wear sandals.  If you use a stepladder:  Make sure that it is fully  opened. Do not climb a closed stepladder.  Make sure that both sides of the stepladder are locked into place.  Ask someone to hold it for you, if possible.  Clearly mark and make sure that you can see:  Any grab bars or handrails.  First and last steps.  Where the edge of each step is.  Use tools that help you move around (mobility aids) if they are needed. These include:  Canes.  Walkers.  Scooters.  Crutches.  Turn on the lights when you go into a dark area. Replace any light bulbs as soon as they burn out.  Set up your furniture so you have a clear path. Avoid moving your furniture around.  If any of your floors are uneven, fix them.  If there are any pets around you, be aware of where they are.  Review your medicines with your doctor. Some medicines can make you feel dizzy. This can increase your chance of falling. Ask your doctor what other things that you can do to help prevent falls. This information is not intended to replace advice given to you by your health care provider. Make sure you discuss any questions you have with your health care provider. Document Released: 03/28/2009 Document Revised: 11/07/2015 Document Reviewed: 07/06/2014 Elsevier Interactive Patient Education  2017 [employer].

## 2018-11-23 ENCOUNTER — Telehealth: Payer: Self-pay

## 2018-11-23 ENCOUNTER — Ambulatory Visit (INDEPENDENT_AMBULATORY_CARE_PROVIDER_SITE_OTHER): Payer: Medicare HMO | Admitting: Pharmacist

## 2018-11-23 DIAGNOSIS — N183 Chronic kidney disease, stage 3 (moderate): Secondary | ICD-10-CM

## 2018-11-23 DIAGNOSIS — IMO0002 Reserved for concepts with insufficient information to code with codable children: Secondary | ICD-10-CM

## 2018-11-23 DIAGNOSIS — I1310 Hypertensive heart and chronic kidney disease without heart failure, with stage 1 through stage 4 chronic kidney disease, or unspecified chronic kidney disease: Secondary | ICD-10-CM

## 2018-11-23 DIAGNOSIS — E1122 Type 2 diabetes mellitus with diabetic chronic kidney disease: Secondary | ICD-10-CM

## 2018-11-23 DIAGNOSIS — E1165 Type 2 diabetes mellitus with hyperglycemia: Secondary | ICD-10-CM

## 2018-11-23 NOTE — Patient Instructions (Signed)
Visit Information  Goals Addressed            This Visit's Progress     Patient Stated   . "I want to keep my blood pressure under control" (pt-stated)       Current Barriers:   Patient reports of hypotension; HCTZ component was discontinued at last office visit with primary care provider. Patient reports taking lisinopril 5 mg (1/2 of 10 mg tablet) daily right now  Reports checking BP daily, and all readings being 120s/50-60s.   Significant caffeine consumption (reports drinking 1.5 pots of coffee daily); though reports weight loss of ~10 lbs through portion reduction  Pharmacist Clinical Goal(s):  Marland Kitchen Over the next 90 days, patient will work with PharmD and PCP to address needs related to safe and effective medication management of hypertension  Interventions: . Encouraged patient to continue lisinopril 5 mg daily for nephroprotective benefit . Encouraged to continue checking BP periodically and bring to upcoming appointment with primary care provider   Patient Self Care Activities:  . Self administers medications as prescribed . Attends all scheduled provider appointments . Calls provider office for new concerns or questions  Please see past updates related to this goal by clicking on the "Past Updates" button in the selected goal      . "I want to manage my diabetes as best I can" (pt-stated)       Current Barriers:  . Uncontrolled diabetes on non-optimal regimen o Last A1c 9.0% on 09/23/2018; patient reports previous AM hypoglycemia with Lantus 35 units daily, so [facility] reduced to 30 units daily and he continues on this dose. Takes along with metformin 500 mg BID and glipizide 10 mg BID. Notes that someone at the [place] recommend he stop his glipizide; he held the glipizide for a few days, and didn't see an impact on his blood sugars- specifically notes no change in his post-prandials o eGFR 49 mL/min  o Breakfast: oatmeal; Lunch: sandwich and soup; Supper: pre-prepared  meals o Fasting: 100-120s; 1 hour after meals 240-250s, but if he goes longer, then 215-220s  Pharmacist Clinical Goal(s):  Marland Kitchen Over the next 90 days, patient will work with PCP and CCM team to address needs related to optimized diabetes management  Interventions: . Discussed that effect of sulfonylurea can wane after 10+ years of therapy; he notes he's been on glipizide for probably 20 years. Agree with [facility] that likely most beneficial to stop glipizide. Encouraged patient to discuss this with Marnee Guarneri at upcoming appointment in July.  . Also discussed considering addition of GLP1 to reduce insulin burden. He plans to discuss this at upcoming appointment.  . Encouraged to continue checking fasting and 2 hour post prandials, and bring these results to primary care provider appointment.   Patient Self Care Activities:  . Self administers medications as prescribed . Calls provider office for new concerns or questions  . Checks fasting and ~2 hour post prandial BG daily  Please see past updates related to this goal by clicking on the "Past Updates" button in the selected goal         The patient verbalized understanding of instructions provided today and declined a print copy of patient instruction materials.   Plan:  - PharmD will contact patient 1-2 weeks after appointment with primary care provider to discuss and reinforce any medication changes   Catie Darnelle Maffucci, PharmD Clinical Pharmacist [place] [phone]

## 2018-11-23 NOTE — Chronic Care Management (AMB) (Signed)
Chronic Care Management   Follow Up Note   11/23/2018 Name: Scott Lawson. MRN: [id number] DOB: 1941-05-01  Referred by: Venita Lick, NP Reason for referral : Chronic Care Management (Medication Management)   [name] is a 78 y.o. year old male who is a primary care patient of Cannady, Barbaraann Faster, NP. The CCM team was consulted for assistance with chronic disease management and care coordination needs.    Contacted patient telephonically to follow up on his self-reduction of lisinopril to 5 mg daily  Review of patient status, including review of consultants reports, relevant laboratory and other test results, and collaboration with appropriate care team members and the patient's provider was performed as part of comprehensive patient evaluation and provision of chronic care management services.    Goals Addressed            This Visit's Progress     Patient Stated   . "I want to keep my blood pressure under control" (pt-stated)       Current Barriers:   Patient reports of hypotension; HCTZ component was discontinued at last office visit with primary care provider. Patient reports taking lisinopril 5 mg (1/2 of 10 mg tablet) daily right now  Reports checking BP daily, and all readings being 120s/50-60s.   Significant caffeine consumption (reports drinking 1.5 pots of coffee daily); though reports weight loss of ~10 lbs through portion reduction  Pharmacist Clinical Goal(s):  Marland Kitchen Over the next 90 days, patient will work with PharmD and PCP to address needs related to safe and effective medication management of hypertension  Interventions: . Encouraged patient to continue lisinopril 5 mg daily for nephroprotective benefit . Encouraged to continue checking BP periodically and bring to upcoming appointment with primary care provider   Patient Self Care Activities:  . Self administers medications as prescribed . Attends all scheduled provider appointments .  Calls provider office for new concerns or questions  Please see past updates related to this goal by clicking on the "Past Updates" button in the selected goal      . "I want to manage my diabetes as best I can" (pt-stated)       Current Barriers:  . Uncontrolled diabetes on non-optimal regimen o Last A1c 9.0% on 09/23/2018; patient reports previous AM hypoglycemia with Lantus 35 units daily, so [facility] reduced to 30 units daily and he continues on this dose. Takes along with metformin 500 mg BID and glipizide 10 mg BID. Notes that someone at the [place] recommend he stop his glipizide; he held the glipizide for a few days, and didn't see an impact on his blood sugars- specifically notes no change in his post-prandials o eGFR 49 mL/min  o Breakfast: oatmeal; Lunch: sandwich and soup; Supper: pre-prepared meals o Fasting: 100-120s; 1 hour after meals 240-250s, but if he goes longer, then 215-220s  Pharmacist Clinical Goal(s):  Marland Kitchen Over the next 90 days, patient will work with PCP and CCM team to address needs related to optimized diabetes management  Interventions: . Discussed that effect of sulfonylurea can wane after 10+ years of therapy; he notes he's been on glipizide for probably 20 years. Agree with [facility] that likely most beneficial to stop glipizide. Encouraged patient to discuss this with Marnee Guarneri at upcoming appointment in July.  . Also discussed considering addition of GLP1 to reduce insulin burden. He plans to discuss this at upcoming appointment.  . Encouraged to continue checking fasting and 2 hour post prandials, and  bring these results to primary care provider appointment.   Patient Self Care Activities:  . Self administers medications as prescribed . Calls provider office for new concerns or questions  . Checks fasting and ~2 hour post prandial BG daily  Please see past updates related to this goal by clicking on the "Past Updates" button in the selected goal         Plan:  -  PharmD will contact patient 1-2 weeks after appointment with primary care provider to discuss and reinforce any medication changes   Catie Darnelle Maffucci, PharmD Clinical Pharmacist [place] [phone]

## 2018-12-16 ENCOUNTER — Other Ambulatory Visit: Payer: Self-pay | Admitting: Nurse Practitioner

## 2018-12-27 ENCOUNTER — Telehealth: Payer: Self-pay | Admitting: Nurse Practitioner

## 2018-12-27 NOTE — Telephone Encounter (Signed)
Called pt to set up virtual appointment for 7/16, no answer, no vm set up

## 2018-12-29 ENCOUNTER — Other Ambulatory Visit: Payer: Self-pay

## 2018-12-29 ENCOUNTER — Encounter: Payer: Self-pay | Admitting: Nurse Practitioner

## 2018-12-29 ENCOUNTER — Ambulatory Visit (INDEPENDENT_AMBULATORY_CARE_PROVIDER_SITE_OTHER): Payer: Medicare HMO | Admitting: Nurse Practitioner

## 2018-12-29 DIAGNOSIS — E1169 Type 2 diabetes mellitus with other specified complication: Secondary | ICD-10-CM

## 2018-12-29 DIAGNOSIS — IMO0002 Reserved for concepts with insufficient information to code with codable children: Secondary | ICD-10-CM

## 2018-12-29 DIAGNOSIS — C61 Malignant neoplasm of prostate: Secondary | ICD-10-CM | POA: Diagnosis not present

## 2018-12-29 DIAGNOSIS — N183 Chronic kidney disease, stage 3 unspecified: Secondary | ICD-10-CM

## 2018-12-29 DIAGNOSIS — I1310 Hypertensive heart and chronic kidney disease without heart failure, with stage 1 through stage 4 chronic kidney disease, or unspecified chronic kidney disease: Secondary | ICD-10-CM | POA: Diagnosis not present

## 2018-12-29 DIAGNOSIS — E1165 Type 2 diabetes mellitus with hyperglycemia: Secondary | ICD-10-CM | POA: Diagnosis not present

## 2018-12-29 DIAGNOSIS — E785 Hyperlipidemia, unspecified: Secondary | ICD-10-CM | POA: Diagnosis not present

## 2018-12-29 DIAGNOSIS — E1122 Type 2 diabetes mellitus with diabetic chronic kidney disease: Secondary | ICD-10-CM

## 2018-12-29 NOTE — Patient Instructions (Signed)
Carbohydrate Counting for Diabetes Mellitus, Adult  Carbohydrate counting is a method of keeping track of how many carbohydrates you eat. Eating carbohydrates naturally increases the amount of sugar (glucose) in the blood. Counting how many carbohydrates you eat helps keep your blood glucose within normal limits, which helps you manage your diabetes (diabetes mellitus). It is important to know how many carbohydrates you can safely have in each meal. This is different for every person. A diet and nutrition specialist (registered dietitian) can help you make a meal plan and calculate how many carbohydrates you should have at each meal and snack. Carbohydrates are found in the following foods:  Grains, such as breads and cereals.  Dried beans and soy products.  Starchy vegetables, such as potatoes, peas, and corn.  Fruit and fruit juices.  Milk and yogurt.  Sweets and snack foods, such as cake, cookies, candy, chips, and soft drinks. How do I count carbohydrates? There are two ways to count carbohydrates in food. You can use either of the methods or a combination of both. Reading "Nutrition Facts" on packaged food The "Nutrition Facts" list is included on the labels of almost all packaged foods and beverages in the [place] It includes:  The serving size.  Information about nutrients in each serving, including the grams (g) of carbohydrate per serving. To use the "Nutrition Facts":  Decide how many servings you will have.  Multiply the number of servings by the number of carbohydrates per serving.  The resulting number is the total amount of carbohydrates that you will be having. Learning standard serving sizes of other foods When you eat carbohydrate foods that are not packaged or do not include "Nutrition Facts" on the label, you need to measure the servings in order to count the amount of carbohydrates:  Measure the foods that you will eat with a food scale or measuring cup, if needed.   Decide how many standard-size servings you will eat.  Multiply the number of servings by 15. Most carbohydrate-rich foods have about 15 g of carbohydrates per serving. ? For example, if you eat 8 oz (170 g) of strawberries, you will have eaten 2 servings and 30 g of carbohydrates (2 servings x 15 g = 30 g).  For foods that have more than one food mixed, such as soups and casseroles, you must count the carbohydrates in each food that is included. The following list contains standard serving sizes of common carbohydrate-rich foods. Each of these servings has about 15 g of carbohydrates:   hamburger bun or  English muffin.   oz (15 mL) syrup.   oz (14 g) jelly.  1 slice of bread.  1 six-inch tortilla.  3 oz (85 g) cooked rice or pasta.  4 oz (113 g) cooked dried beans.  4 oz (113 g) starchy vegetable, such as peas, corn, or potatoes.  4 oz (113 g) hot cereal.  4 oz (113 g) mashed potatoes or  of a large baked potato.  4 oz (113 g) canned or frozen fruit.  4 oz (120 mL) fruit juice.  4-6 crackers.  6 chicken nuggets.  6 oz (170 g) unsweetened dry cereal.  6 oz (170 g) plain fat-free yogurt or yogurt sweetened with artificial sweeteners.  8 oz (240 mL) milk.  8 oz (170 g) fresh fruit or one small piece of fruit.  24 oz (680 g) popped popcorn. Example of carbohydrate counting Sample meal  3 oz (85 g) chicken breast.  6 oz (170 g)   brown rice.  4 oz (113 g) corn.  8 oz (240 mL) milk.  8 oz (170 g) strawberries with sugar-free whipped topping. Carbohydrate calculation 1. Identify the foods that contain carbohydrates: ? Rice. ? Corn. ? Milk. ? Strawberries. 2. Calculate how many servings you have of each food: ? 2 servings rice. ? 1 serving corn. ? 1 serving milk. ? 1 serving strawberries. 3. Multiply each number of servings by 15 g: ? 2 servings rice x 15 g = 30 g. ? 1 serving corn x 15 g = 15 g. ? 1 serving milk x 15 g = 15 g. ? 1 serving  strawberries x 15 g = 15 g. 4. Add together all of the amounts to find the total grams of carbohydrates eaten: ? 30 g + 15 g + 15 g + 15 g = 75 g of carbohydrates total. Summary  Carbohydrate counting is a method of keeping track of how many carbohydrates you eat.  Eating carbohydrates naturally increases the amount of sugar (glucose) in the blood.  Counting how many carbohydrates you eat helps keep your blood glucose within normal limits, which helps you manage your diabetes.  A diet and nutrition specialist (registered dietitian) can help you make a meal plan and calculate how many carbohydrates you should have at each meal and snack. This information is not intended to replace advice given to you by your health care provider. Make sure you discuss any questions you have with your health care provider. Document Released: 06/01/2005 Document Revised: 12/24/2016 Document Reviewed: 11/13/2015 Elsevier Patient Education  2020 [employer]  

## 2018-12-29 NOTE — Assessment & Plan Note (Signed)
Followed by [facility] urology, continue to discuss with patient and attempt to obtain [facility] records for review.

## 2018-12-29 NOTE — Progress Notes (Signed)
There were no vitals taken for this visit.   Subjective:    Patient ID: Scott Lawson., male    DOB: Dec 15, 1940, 78 y.o.   MRN: [id number]  HPI: [name]. is a 78 y.o. male  Chief Complaint  Patient presents with  . Diabetes  . Hypertension  . Hyperlipidemia    . This visit was completed via telephone due to the restrictions of the COVID-19 pandemic. All issues as above were discussed and addressed but no physical exam was performed. If it was felt that the patient should be evaluated in the office, they were directed there. The patient verbally consented to this visit. Patient was unable to complete an audio/visual visit due to Lack of equipment. Due to the catastrophic nature of the COVID-19 pandemic, this visit was done through audio contact only. . Location of the patient: home . Location of the provider: home . Those involved with this call:  . Provider: Marnee Guarneri, DNP . CMA: Gerda Diss, CMA . Front Desk/Registration: Jill Side  . Time spent on call: 15 minutes on the phone discussing health concerns. 10 minutes total spent in review of patient's record and preparation of their chart.  . I verified patient identity using two factors (patient name and date of birth). Patient consents verbally to being seen via telemedicine visit today.   Overall at this visit he reports "doing well" with no acute issues at this time.  DIABETES April A1C 9.0%.  Currently taking Metformin 500 MG BID, was told at [facility] to stop taking Glipizide and is not taking at this time.  Continues to take Lantus 35 units, he would like to reduce this.  States he tried weekly injections in past, Ozempic, but it made him feel "weird".  Tried this one year ago, when he came off of it he felt better.  Discussed with him possibly trial of Trulicity, alternate GLP, to decrease insulin burden.  We discussed waiting to see lab results.   Hypoglycemic episodes:no Polydipsia/polyuria: no Visual  disturbance: no Chest pain: no Paresthesias: no Glucose Monitoring: yes  Accucheck frequency: Daily  Fasting glucose: 100-120, 128 this morning  Post prandial: 240-250  Evening:  Before meals: Taking Insulin?: yes  Long acting insulin:  Short acting insulin: Blood Pressure Monitoring: daily Retinal Examination: Not up to Date Foot Exam: Not up to Date Pneumovax: Up to Date Influenza: Up to Date Aspirin: yes   HYPERTENSION / HYPERLIPIDEMIA Continues on Lisinopril 10 MG and Lipitor 40 MG.  Reports improvement in dizzy spells with reduction in BP medications and reduction in diabetes medications.  States appreciation for the team work of CCM and provider. Satisfied with current treatment? yes Duration of hypertension: chronic BP monitoring frequency: daily BP range: 110-120/70's range  BP medication side effects: no Duration of hyperlipidemia: chronic Cholesterol medication side effects: no Cholesterol supplements: none Medication compliance: good compliance Aspirin: yes Recent stressors: no Recurrent headaches: no Visual changes: no Palpitations: no Dyspnea: no Chest pain: no Lower extremity edema: no Dizzy/lightheaded: no   PROSTATE CANCER: Is followed at the [place] in [place] and has blood draws every 60 days.  He states they told him "you are too old for surgery".  Reports the male doctor at [place] yesterday told him this.  They discussed radiation and "freezing" plus removing.  He reports he lost his sister, mother, father, and brother to cancer.  States he continues to see [place] providers for this.  Denies any current symptoms.  Relevant past medical, surgical, family and social history reviewed and updated as indicated. Interim medical history since our last visit reviewed. Allergies and medications reviewed and updated.  Review of Systems  Constitutional: Negative for activity change, diaphoresis, fatigue and fever.  Respiratory: Negative for cough, chest tightness,  shortness of breath and wheezing.   Cardiovascular: Negative for chest pain, palpitations and leg swelling.  Gastrointestinal: Negative for abdominal distention, abdominal pain, constipation, diarrhea, nausea and vomiting.  Endocrine: Negative for cold intolerance, heat intolerance, polydipsia, polyphagia and polyuria.  Musculoskeletal: Negative.   Skin: Negative.   Neurological: Negative for dizziness, syncope, weakness, light-headedness, numbness and headaches.  Psychiatric/Behavioral: Negative.     Per HPI unless specifically indicated above     Objective:    There were no vitals taken for this visit.  Wt Readings from Last 3 Encounters:  06/27/18 230 lb (104.3 kg)  03/23/18 233 lb (105.7 kg)  12/23/17 232 lb (105.2 kg)    Physical Exam   Unable to perform, telephone only.  Results for orders placed or performed in visit on 09/23/18  Comprehensive metabolic panel  Result Value Ref Range   Glucose 91 65 - 99 mg/dL   BUN 37 (H) 8 - 27 mg/dL   Creatinine, Ser 1.38 (H) 0.76 - 1.27 mg/dL   GFR calc non Af Amer 49 (L) >59 mL/min/1.73   GFR calc Af Amer 57 (L) >59 mL/min/1.73   BUN/Creatinine Ratio 27 (H) 10 - 24   Sodium 139 134 - 144 mmol/L   Potassium 5.3 (H) 3.5 - 5.2 mmol/L   Chloride 102 96 - 106 mmol/L   CO2 19 (L) 20 - 29 mmol/L   Calcium 9.7 8.6 - 10.2 mg/dL   Total Protein 7.1 6.0 - 8.5 g/dL   Albumin 4.9 (H) 3.7 - 4.7 g/dL   Globulin, Total 2.2 1.5 - 4.5 g/dL   Albumin/Globulin Ratio 2.2 1.2 - 2.2   Bilirubin Total 0.4 0.0 - 1.2 mg/dL   Alkaline Phosphatase 83 39 - 117 IU/L   AST 17 0 - 40 IU/L   ALT 15 0 - 44 IU/L  Microalbumin, Urine Waived  Result Value Ref Range   Microalb, Ur Waived 30 (H) 0 - 19 mg/L   Creatinine, Urine Waived 50 10 - 300 mg/dL   Microalb/Creat Ratio 30-300 (H) <30 mg/g  Vitamin B12  Result Value Ref Range   Vitamin B-12 440 232 - 1,245 pg/mL  Bayer DCA Hb A1c Waived  Result Value Ref Range   HB A1C (BAYER DCA - WAIVED) 9.0 (H)  <7.0 %      Assessment & Plan:   Problem List Items Addressed This Visit      Cardiovascular and Mediastinum   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HCC)    Chronic, stable with improvement in orthostatic symptoms (dizziness) with medication reduction.  BP at home in goal range for age.  Continue current medication regimen.  Obtain outpatient labs.      Relevant Medications   lisinopril (ZESTRIL) 20 MG tablet     Endocrine   Hyperlipidemia associated with type 2 diabetes mellitus (HCC)    Chronic, ongoing.  Continue current medication regimen.  Outpatient labs ordered.      Relevant Medications   lisinopril (ZESTRIL) 20 MG tablet   Other Relevant Orders   Bayer DCA Hb A1c Waived   Comprehensive metabolic panel   Lipid Panel [place], Waived   Uncontrolled type 2 diabetes mellitus with chronic  kidney disease ([place]) - Primary    Chronic, ongoing.  Continue current medication regimen with goal of reducing insulin burden in upcoming months and possible trial of Trulicity if elevation in A1C.  Is also [facility] at [facility].  [facility] to provide to this PCP.  Outpatient labs ordered.  Adjust medication regimen as needed.  Return in 3 months.      Relevant Medications   lisinopril (ZESTRIL) 20 MG tablet   Other Relevant Orders   Bayer DCA Hb A1c Waived     Genitourinary   Prostate cancer [facility])    Followed by [facility] urology, continue to discuss with patient and attempt to obtain [place] records for review.         I discussed the assessment and treatment plan with the patient. The patient was provided an opportunity to ask questions and all were answered. The patient agreed with the plan and demonstrated an understanding of the instructions.   The patient was advised to call back or seek an in-person evaluation if the symptoms worsen or if the condition fails to improve as anticipated.   I provided 15 minutes of  time during this encounter.  Follow up plan: Return in about 3 months (around 03/31/2019) for T2DM, HTN/HLD (in office).

## 2018-12-29 NOTE — Assessment & Plan Note (Signed)
Chronic, ongoing.  Continue current medication regimen.  Outpatient labs ordered.

## 2018-12-29 NOTE — Assessment & Plan Note (Signed)
Chronic, ongoing.  Continue current medication regimen with goal of reducing insulin burden in upcoming months and possible trial of Trulicity if elevation in A1C.  Is also followed [facility] PCP at [facility].  Recommend [facility] if possible to provide to this PCP.  Outpatient labs ordered.  Adjust medication regimen as needed.  Return in 3 months.

## 2018-12-29 NOTE — Assessment & Plan Note (Signed)
Chronic, stable with improvement in orthostatic symptoms (dizziness) with medication reduction.  BP at home in goal range for age.  Continue current medication regimen.  Obtain outpatient labs.

## 2019-01-09 ENCOUNTER — Other Ambulatory Visit: Payer: Self-pay

## 2019-01-09 MED ORDER — ATORVASTATIN CALCIUM 40 MG PO TABS: 40.0000 mg | ORAL_TABLET | Freq: Every day | ORAL | 3 refills | Status: DC

## 2019-01-10 ENCOUNTER — Ambulatory Visit (INDEPENDENT_AMBULATORY_CARE_PROVIDER_SITE_OTHER): Payer: Medicare HMO | Admitting: Pharmacist

## 2019-01-10 ENCOUNTER — Other Ambulatory Visit: Payer: Self-pay | Admitting: Nurse Practitioner

## 2019-01-10 DIAGNOSIS — E1165 Type 2 diabetes mellitus with hyperglycemia: Secondary | ICD-10-CM

## 2019-01-10 DIAGNOSIS — E1122 Type 2 diabetes mellitus with diabetic chronic kidney disease: Secondary | ICD-10-CM

## 2019-01-10 DIAGNOSIS — N183 Chronic kidney disease, stage 3 (moderate): Secondary | ICD-10-CM

## 2019-01-10 DIAGNOSIS — I1310 Hypertensive heart and chronic kidney disease without heart failure, with stage 1 through stage 4 chronic kidney disease, or unspecified chronic kidney disease: Secondary | ICD-10-CM | POA: Diagnosis not present

## 2019-01-10 DIAGNOSIS — IMO0002 Reserved for concepts with insufficient information to code with codable children: Secondary | ICD-10-CM

## 2019-01-10 MED ORDER — LISINOPRIL 20 MG PO TABS: 10.0000 mg | ORAL_TABLET | Freq: Every day | ORAL | 3 refills | Status: DC

## 2019-01-10 NOTE — Chronic Care Management (AMB) (Signed)
Chronic Care Management   Follow Up Note   01/10/2019 Name: Scott Lawson Scott Lawson. MRN: [id number] DOB: 09-11-1940  Referred by: Scott Lick, NP Reason for referral : Chronic Care Management (Medication Management)   Scott Lawson. is a 78 y.o. year old male who is a primary care patient of Lawson, Barbaraann Faster, NP. The CCM team was consulted for assistance with chronic disease management and care coordination needs.    Contacted patient for medication management review today.   Review of patient status, including review of consultants reports, relevant laboratory and other test results, and collaboration with appropriate care team members and the patient's provider was performed as part of comprehensive patient evaluation and provision of chronic care management services.    Goals Addressed            This Visit's Progress     Patient Stated   . COMPLETED: "I want to keep my blood pressure under control" (pt-stated)       Current Barriers:   Patient reports of hypotension, dizziness. Through titration and review, settled on lisinopril 10 mg daily.   Last prescription sent to pharmacy was for 20 mg daily; patient is currently taking 1/2 tab  Pharmacist Clinical Goal(s):  Marland Kitchen Over the next 90 days, patient will work with PharmD and PCP to address needs related to safe and effective medication management of hypertension  Interventions: . Encouraged continued use of lisinopril 10 mg daily. Recommend sending updated prescription for 10 mg daily to [facility] so that patient does not appear nonadherent  Patient Self Care Activities:  . Self administers medications as prescribed . Attends all scheduled provider appointments . Calls provider office for new concerns or questions  Please see past updates related to this goal by clicking on the "Past Updates" button in the selected goal      . "I want to manage my diabetes as best I can" (pt-stated)       Current Barriers:  .  Diabetes: uncontrolled, most recent A1c 9.0%, due for A1c. Notes that his sugars have drifted up in the past few weeks, so he increased Lantus to 40 units. o Due for labwork per [facility], has not been scheduled.   . Continues with metformin 500 mg BID  o Discussed addition of Trulicity with Scott Lawson at last appointment.  . Current blood glucose readings: 120-125 fasting . Cardiovascular risk reduction: o Current hypertensive regimen: lisinopril 10 mg daily  o Current hyperlipidemia regimen: atorvastatin 20 mg daily (notes he receives today)  Pharmacist Clinical Goal(s):  Marland Kitchen Over the next 90 days, patient with work with PharmD and primary care provider to address optimized diabetes management  Interventions: . Patient due for lab work (A1c, BMP, lipids) to help guide antihyperglycemic therapy. Will collaborate with office staff to have patient scheduled for fasting labs.   Patient Self Care Activities:  . Patient will check blood glucose daily, document, and provide at future appointments . Patient will take medications as prescribed . Patient will report any questions or concerns to provider   Please see past updates related to this goal by clicking on the "Past Updates" button in the selected goal          Plan:  - Will collaborate with office staff and primary care provider on above - Will outreach patient either after resulted lab work or within 4-5 weeks for continued medication management  Scott Lawson, PharmD Clinical Pharmacist [place] [phone]

## 2019-01-10 NOTE — Patient Instructions (Signed)
Visit Information  Goals Addressed            This Visit's Progress     Patient Stated   . COMPLETED: "I want to keep my blood pressure under control" (pt-stated)       Current Barriers:   Patient reports of hypotension, dizziness. Through titration and review, settled on lisinopril 10 mg daily.   Last prescription sent to pharmacy was for 20 mg daily; patient is currently taking 1/2 tab  Pharmacist Clinical Goal(s):  Marland Kitchen Over the next 90 days, patient will work with PharmD and PCP to address needs related to safe and effective medication management of hypertension  Interventions: . Encouraged continued use of lisinopril 10 mg daily. Recommend sending updated prescription for 10 mg daily to [facility] so that patient does not appear nonadherent  Patient Self Care Activities:  . Self administers medications as prescribed . Attends all scheduled provider appointments . Calls provider office for new concerns or questions  Please see past updates related to this goal by clicking on the "Past Updates" button in the selected goal      . "I want to manage my diabetes as best I can" (pt-stated)       Current Barriers:  . Diabetes: uncontrolled, most recent A1c 9.0%, due for A1c. Notes that his sugars have drifted up in the past few weeks, so he increased Lantus to 40 units. o Due for labwork per [facility], has not been scheduled.   . Continues with metformin 500 mg BID  o Discussed addition of Trulicity with Jolene Cannady at last appointment.  . Current blood glucose readings: 120-125 fasting . Cardiovascular risk reduction: o Current hypertensive regimen: lisinopril 10 mg daily  o Current hyperlipidemia regimen: atorvastatin 20 mg daily (notes he receives today)  Pharmacist Clinical Goal(s):  Marland Kitchen Over the next 90 days, patient with work with PharmD and primary care provider to address optimized diabetes management  Interventions: . Patient due for lab work (A1c, BMP, lipids) to  help guide antihyperglycemic therapy. Will collaborate with office staff to have patient scheduled for fasting labs.   Patient Self Care Activities:  . Patient will check blood glucose daily, document, and provide at future appointments . Patient will take medications as prescribed . Patient will report any questions or concerns to provider   Please see past updates related to this goal by clicking on the "Past Updates" button in the selected goal         The patient verbalized understanding of instructions provided today and declined a print copy of patient instruction materials.   Plan:  - Will collaborate with office staff and primary care provider on above - Will outreach patient either after resulted lab work or within 4-5 weeks for continued medication management  Catie Darnelle Maffucci, PharmD Clinical Pharmacist [place] [phone]

## 2019-01-12 ENCOUNTER — Other Ambulatory Visit: Payer: Medicare HMO

## 2019-01-12 ENCOUNTER — Other Ambulatory Visit: Payer: Self-pay

## 2019-01-12 DIAGNOSIS — IMO0002 Reserved for concepts with insufficient information to code with codable children: Secondary | ICD-10-CM

## 2019-01-12 DIAGNOSIS — E1165 Type 2 diabetes mellitus with hyperglycemia: Secondary | ICD-10-CM | POA: Diagnosis not present

## 2019-01-12 DIAGNOSIS — E1122 Type 2 diabetes mellitus with diabetic chronic kidney disease: Secondary | ICD-10-CM

## 2019-01-12 DIAGNOSIS — E1169 Type 2 diabetes mellitus with other specified complication: Secondary | ICD-10-CM | POA: Diagnosis not present

## 2019-01-12 DIAGNOSIS — E785 Hyperlipidemia, unspecified: Secondary | ICD-10-CM | POA: Diagnosis not present

## 2019-01-12 LAB — BAYER DCA HB A1C WAIVED: HB A1C (BAYER DCA - WAIVED): 10.6 % — ABNORMAL HIGH (ref <7.0)

## 2019-01-12 LAB — LIPID PANEL PICCOLO, WAIVED
Chol/HDL Ratio Piccolo,Waive: 3.3 mg/dL
Cholesterol Piccolo, Waived: 179 mg/dL (ref <200)
HDL Chol Piccolo, Waived: 54 mg/dL — ABNORMAL LOW (ref >59)
LDL Chol Calc Piccolo Waived: 57 mg/dL (ref <100)
Triglycerides Piccolo,Waived: 342 mg/dL — ABNORMAL HIGH (ref <150)
VLDL Chol Calc Piccolo,Waive: 68 mg/dL — ABNORMAL HIGH (ref <30)

## 2019-01-13 ENCOUNTER — Telehealth: Payer: Self-pay | Admitting: Nurse Practitioner

## 2019-01-13 LAB — COMPREHENSIVE METABOLIC PANEL
ALT: 15 IU/L (ref 0–44)
AST: 13 IU/L (ref 0–40)
Albumin/Globulin Ratio: 1.8 (ref 1.2–2.2)
Albumin: 4.1 g/dL (ref 3.7–4.7)
Alkaline Phosphatase: 72 IU/L (ref 39–117)
BUN/Creatinine Ratio: 16 (ref 10–24)
BUN: 21 mg/dL (ref 8–27)
Bilirubin Total: 0.6 mg/dL (ref 0.0–1.2)
CO2: 20 mmol/L (ref 20–29)
Calcium: 8.6 mg/dL (ref 8.6–10.2)
Chloride: 105 mmol/L (ref 96–106)
Creatinine, Ser: 1.31 mg/dL — ABNORMAL HIGH (ref 0.76–1.27)
GFR calc Af Amer: 60 mL/min/{1.73_m2} (ref >59)
GFR calc non Af Amer: 52 mL/min/{1.73_m2} — ABNORMAL LOW (ref >59)
Globulin, Total: 2.3 g/dL (ref 1.5–4.5)
Glucose: 168 mg/dL — ABNORMAL HIGH (ref 65–99)
Potassium: 3.9 mmol/L (ref 3.5–5.2)
Sodium: 144 mmol/L (ref 134–144)
Total Protein: 6.4 g/dL (ref 6.0–8.5)

## 2019-01-13 NOTE — Telephone Encounter (Signed)
Spoke to patient via telephone, discussed addition of Trulicity with him.  He is concerned with this due to his experience with Ozempic and GI issues.  Discussed that a trial of a different family member would be beneficial.  He wishes to think about this over weekend and discuss further on Tuesday.  He inquired as to whether samples are available in office.  Will check on this. He reports he would prefer trying a sample and if it works then he will get [place] to order for him.  He discussed his frustrations with his diabetes, stating "I eat right".  Listened and offered encouragement.

## 2019-01-17 ENCOUNTER — Telehealth: Payer: Self-pay

## 2019-01-17 ENCOUNTER — Other Ambulatory Visit: Payer: Self-pay | Admitting: Nurse Practitioner

## 2019-01-17 ENCOUNTER — Ambulatory Visit: Payer: Self-pay | Admitting: Pharmacist

## 2019-01-17 DIAGNOSIS — E1122 Type 2 diabetes mellitus with diabetic chronic kidney disease: Secondary | ICD-10-CM

## 2019-01-17 DIAGNOSIS — IMO0002 Reserved for concepts with insufficient information to code with codable children: Secondary | ICD-10-CM

## 2019-01-17 MED ORDER — TRULICITY 0.75 MG/0.5ML ~~LOC~~ SOAJ: 0.7500 mg | SUBCUTANEOUS | 2 refills | Status: DC

## 2019-01-17 NOTE — Patient Instructions (Signed)
Visit Information  Goals Addressed            This Visit's Progress     Patient Stated   . "I want to manage my diabetes as best I can" (pt-stated)       Current Barriers:  . Diabetes: uncontrolled; most recent A1c 10.6%  . Current antihyperglycemic regimen: metformin 500 mg BID, Lantus 35 units daily o Hx GI upset with Ozempic therapy; discussed starting Trulicity with Marnee Guarneri, NP after results of last labwork . Cardiovascular risk reduction: o Current hypertensive regimen: lisinopril 10 mg daily, BP well controlled on recent visits o Current hyperlipidemia regimen: atorvastatin 40 mg daily; LDL at goal <70 on last check  Pharmacist Clinical Goal(s):  Marland Kitchen Over the next 90 days, patient with work with PharmD and primary care provider to address optimized medication management  Interventions: . Comprehensive medication review performed . Discussed mechanism of action, benefits, and side effects of Trulicity therapy. Patient has follow up with his [place] doctor next month. He is amenable to trying 1 month of Trulicity, and having [place] doctor prescribe moving forward. Will collaborate with Marnee Guarneri, NP to have prescription sent for Trulicity 0.94 mg once weekly to [facility]. Patient will call me this afternoon to let me know if his copay was too expensive.  . Moving forward, will discuss more post-prandial BG checks to evaluate diabeteic control  Patient Self Care Activities:  . Patient will check blood glucose BID, document, and provide at future appointments . Patient will take medications as prescribed . Patient will report any questions or concerns to provider   Please see past updates related to this goal by clicking on the "Past Updates" button in the selected goal          The patient verbalized understanding of instructions provided today and declined a print copy of patient instruction materials.   Plan:  - Will outreach patient in 4-5 weeks for continued  medication management support  Catie Darnelle Maffucci, PharmD Clinical Pharmacist [place] [phone]

## 2019-01-17 NOTE — Progress Notes (Signed)
Script for [employer] sent.  Refer to [facility] pharmacist note.

## 2019-01-17 NOTE — Chronic Care Management (AMB) (Signed)
  Chronic Care Management   Note  01/17/2019 Name: Scott Lawson. MRN: [id number] DOB: 11/04/1940  Princess Bruins Ricklefs [name] is a 78 y.o. year old male who is a primary care patient of Cannady, Barbaraann Faster, NP. The CCM team was consulted for assistance with chronic disease management and care coordination needs.    Received call from patient. Copay from [facility] was $50, and he cannot afford this at this time. He has contacted the [place] pharmacy to ask to transfer the prescription, and see if it would be the $6 price of the Lantus if prescribed under [facility], or, if it has to come from a [place] doctor. He will give me a call back tomorrow to update me.   Follow up plan: - Will await return call from patient.   Catie Darnelle Maffucci, PharmD Clinical Pharmacist [place] [phone]

## 2019-01-17 NOTE — Chronic Care Management (AMB) (Signed)
  Chronic Care Management   Follow Up Note   01/17/2019 Name: Scott Lawson. MRN: [id number] DOB: [birth year]  Referred by: Venita Lick, NP Reason for referral : Chronic Care Management (Medication Management)   Scott Roof. is a 78 y.o. year old male who is a primary care patient of Cannady, Barbaraann Faster, NP. The CCM team was consulted for assistance with chronic disease management and care coordination needs.    Spoke with patient telephonically today.   Review of patient status, including review of consultants reports, relevant laboratory and other test results, and collaboration with appropriate care team members and the patient's provider was performed as part of comprehensive patient evaluation and provision of chronic care management services.    Goals Addressed            This Visit's Progress     Patient Stated   . "I want to manage my diabetes as best I can" (pt-stated)       Current Barriers:  . Diabetes: uncontrolled; most recent A1c 10.6%  . Current antihyperglycemic regimen: metformin 500 mg BID, Lantus 35 units daily o Hx GI upset with Ozempic therapy; discussed starting Trulicity with Marnee Guarneri, NP after results of last labwork . Cardiovascular risk reduction: o Current hypertensive regimen: lisinopril 10 mg daily, BP well controlled on recent visits o Current hyperlipidemia regimen: atorvastatin 40 mg daily; LDL at goal <70 on last check  Pharmacist Clinical Goal(s):  Marland Kitchen Over the next 90 days, patient with work with PharmD and primary care provider to address optimized medication management  Interventions: . Comprehensive medication review performed . Discussed mechanism of action, benefits, and side effects of Trulicity therapy. Patient has follow up with his [place] doctor next month. He is amenable to trying 1 month of Trulicity, and having [place] doctor prescribe moving forward. Will collaborate with Marnee Guarneri, NP to have prescription sent for  Trulicity 9.93 mg once weekly to [facility]. Patient will call me this afternoon to let me know if his copay was too expensive.  . Moving forward, will discuss more post-prandial BG checks to evaluate diabeteic control  Patient Self Care Activities:  . Patient will check blood glucose BID, document, and provide at future appointments . Patient will take medications as prescribed . Patient will report any questions or concerns to provider   Please see past updates related to this goal by clicking on the "Past Updates" button in the selected goal           Plan:  - Will outreach patient in 4-5 weeks for continued medication management support  Catie Darnelle Maffucci, PharmD Clinical Pharmacist [place] [phone]

## 2019-01-27 ENCOUNTER — Other Ambulatory Visit: Payer: Self-pay | Admitting: Nurse Practitioner

## 2019-02-08 ENCOUNTER — Ambulatory Visit (INDEPENDENT_AMBULATORY_CARE_PROVIDER_SITE_OTHER): Payer: Medicare HMO | Admitting: Pharmacist

## 2019-02-08 DIAGNOSIS — E1165 Type 2 diabetes mellitus with hyperglycemia: Secondary | ICD-10-CM | POA: Diagnosis not present

## 2019-02-08 DIAGNOSIS — IMO0002 Reserved for concepts with insufficient information to code with codable children: Secondary | ICD-10-CM

## 2019-02-08 DIAGNOSIS — E1122 Type 2 diabetes mellitus with diabetic chronic kidney disease: Secondary | ICD-10-CM | POA: Diagnosis not present

## 2019-02-08 NOTE — Chronic Care Management (AMB) (Signed)
Chronic Care Management   Follow Up Note   02/08/2019 Name: Scott Lawson. MRN: [id number] DOB: [birth year]  Referred by: Venita Lick, NP Reason for referral : Chronic Care Management (Medication Management)   Scott Lawson. is a 78 y.o. year old male who is a primary care patient of Cannady, Barbaraann Faster, NP. The CCM team was consulted for assistance with chronic disease management and care coordination needs.    Contacted patient telephonically for medication management review.   Review of patient status, including review of consultants reports, relevant laboratory and other test results, and collaboration with appropriate care team members and the patient's provider was performed as part of comprehensive patient evaluation and provision of chronic care management services.    SDOH (Social Determinants of Health) screening performed today: Financial Strain . See Care Plan for related entries.   Outpatient Encounter Medications as of 02/08/2019  Medication Sig  . aspirin 81 MG tablet Take 81 mg by mouth daily.  Marland Kitchen atorvastatin (LIPITOR) 40 MG tablet Take 1 tablet (40 mg total) by mouth daily.  . fluticasone (FLONASE) 50 MCG/ACT nasal spray Place 2 sprays into both nostrils daily as needed.   . insulin glargine (LANTUS) 100 UNIT/ML injection Inject 38 Units into the skin every evening. 1800  . lisinopril (ZESTRIL) 20 MG tablet Take 0.5 tablets (10 mg total) by mouth daily.  . meloxicam (MOBIC) 15 MG tablet Take 15 mg by mouth daily.  . metFORMIN (GLUCOPHAGE) 500 MG tablet TAKE 1 TABLET (500 MG TOTAL) BY MOUTH 2 (TWO) TIMES DAILY WITH A MEAL.  Marland Kitchen omeprazole (PRILOSEC) 20 MG capsule TAKE 1 CAPSULE (20 MG TOTAL) BY MOUTH DAILY.  . tamsulosin (FLOMAX) 0.4 MG CAPS capsule TAKE 1 CAPSULE EVERY DAY  . TRUE METRIX BLOOD GLUCOSE TEST test strip CHECK BLOOD SUGAR TWICE DAILY  . TRUEplus Lancets 33G MISC CHECK BLOOD SUGAR TWICE DAILY  . Dulaglutide (TRULICITY) A999333 [id number] SOPN  Inject 0.75 mg into the skin once a week. (Patient not taking: Reported on 02/08/2019)   No facility-administered encounter medications on file as of 02/08/2019.      Goals Addressed            This Visit's Progress     Patient Stated   . "I want to manage my diabetes as best I can" (pt-stated)       Current Barriers:  . Diabetes: uncontrolled; most recent A1c 10.6%  o Discussed Trulicity therapy, but medication is too expensive if not prescribed by the [place]. Patient has an appointment with his [place] doctor on 02/27/2019, and he will ask about the therapy at that time. Patient requested I mail him the spelling of the medication . Current antihyperglycemic regimen: metformin 500 mg BID, Lantus 38 units daily- increased after elevated A1c result o Hx GI upset with Ozempic therapy; discussed starting Trulicity with Marnee Guarneri, NP after results of last labwork . Most recent glucose results: fasting 110-120s  . Cardiovascular risk reduction: o Current hypertensive regimen: lisinopril 10 mg daily, BP well controlled on recent visits o Current hyperlipidemia regimen: atorvastatin 40 mg daily; LDL at goal <70 on last check  Pharmacist Clinical Goal(s):  Marland Kitchen Over the next 90 days, patient with work with PharmD and primary care provider to address optimized medication management  Interventions: . Comprehensive medication review performed . Discussed purpose of pursuing Trulicity therapy to allow for reduction in hypoglycemic-prone medications. Patient verbalized understanding and will discuss with [place] doctor . Discussed to keep  appointment for eye doctor in October for diabetic eye exam . Discussed to get influenza vaccine in the next 1-2 months; recommend high dose version. Patient verbalized understanding, noted he would get it at [facility] in the next month.   Patient Self Care Activities:  . Patient will check blood glucose BID, document, and provide at future appointments . Patient will take  medications as prescribed . Patient will report any questions or concerns to provider   Please see past updates related to this goal by clicking on the "Past Updates" button in the selected goal           Plan:  - Will mail patient medication list and our thoughts on Trulicity therapy today - Will outreach patient in 4-5 weeks for continued medication management support  Catie Darnelle Maffucci, PharmD Clinical Pharmacist [place] [phone]

## 2019-02-08 NOTE — Patient Instructions (Signed)
Visit Information  [name],   It was great speaking with you today. I am glad to hear how well things are going.   Enclosed is your current medication list.   Talk to your [place] doctor about adding Trulicity. Though you had some stomach upset with Ozempic before, Trulicity is a little less strong of a medication, so you may tolerate it better. This will allow [place] to be able to use smaller doses of Lantus, which will decrease your risk of low blood sugars and falls.   Keep checking your blood sugar daily. Keep checking your blood pressure once weekly on the lisinopril 10 mg (1/2 of 20 mg tablet).   Feel free to call me with any questions or concerns!   Catie Darnelle Maffucci, PharmD [phone]  Goals Addressed            This Visit's Progress     Patient Stated   . "I want to manage my diabetes as best I can" (pt-stated)       Current Barriers:  . Diabetes: uncontrolled; most recent A1c 10.6%  o Discussed Trulicity therapy, but medication is too expensive if not prescribed by the [place]. Patient has an appointment with his [place] doctor on 02/27/2019, and he will ask about the therapy at that time. Patient requested I mail him the spelling of the medication . Current antihyperglycemic regimen: metformin 500 mg BID, Lantus 38 units daily- increased after elevated A1c result o Hx GI upset with Ozempic therapy; discussed starting Trulicity with Marnee Guarneri, NP after results of last labwork . Most recent glucose results: fasting 110-120s  . Cardiovascular risk reduction: o Current hypertensive regimen: lisinopril 10 mg daily, BP well controlled on recent visits o Current hyperlipidemia regimen: atorvastatin 40 mg daily; LDL at goal <70 on last check  Pharmacist Clinical Goal(s):  Marland Kitchen Over the next 90 days, patient with work with PharmD and primary care provider to address optimized medication management  Interventions: . Comprehensive medication review performed . Discussed purpose of pursuing  Trulicity therapy to allow for reduction in hypoglycemic-prone medications. Patient verbalized understanding and will discuss with [place] doctor . Discussed to keep appointment for eye doctor in October for diabetic eye exam . Discussed to get influenza vaccine in the next 1-2 months; recommend high dose version. Patient verbalized understanding, noted he would get it at [facility] in the next month.   Patient Self Care Activities:  . Patient will check blood glucose BID, document, and provide at future appointments . Patient will take medications as prescribed . Patient will report any questions or concerns to provider   Please see past updates related to this goal by clicking on the "Past Updates" button in the selected goal          Print copy of patient instructions provided.   Plan:  - Will mail patient medication list and our thoughts on Trulicity therapy today - Will outreach patient in 4-5 weeks for continued medication management support  Catie Darnelle Maffucci, PharmD Clinical Pharmacist [place] [phone]

## 2019-03-10 ENCOUNTER — Other Ambulatory Visit: Payer: Self-pay | Admitting: Nurse Practitioner

## 2019-04-05 ENCOUNTER — Other Ambulatory Visit: Payer: Self-pay | Admitting: Nurse Practitioner

## 2019-04-17 ENCOUNTER — Ambulatory Visit: Payer: Self-pay | Admitting: Nurse Practitioner

## 2019-04-18 ENCOUNTER — Other Ambulatory Visit: Payer: Self-pay | Admitting: Unknown Physician Specialty

## 2019-04-20 ENCOUNTER — Ambulatory Visit (INDEPENDENT_AMBULATORY_CARE_PROVIDER_SITE_OTHER): Payer: Medicare HMO | Admitting: Unknown Physician Specialty

## 2019-04-20 ENCOUNTER — Encounter: Payer: Self-pay | Admitting: Unknown Physician Specialty

## 2019-04-20 ENCOUNTER — Other Ambulatory Visit: Payer: Self-pay

## 2019-04-20 VITALS — BP 96/57 | HR 56 | Ht 67.0 in | Wt 222.0 lb

## 2019-04-20 DIAGNOSIS — I1310 Hypertensive heart and chronic kidney disease without heart failure, with stage 1 through stage 4 chronic kidney disease, or unspecified chronic kidney disease: Secondary | ICD-10-CM | POA: Diagnosis not present

## 2019-04-20 DIAGNOSIS — E1169 Type 2 diabetes mellitus with other specified complication: Secondary | ICD-10-CM | POA: Diagnosis not present

## 2019-04-20 DIAGNOSIS — R001 Bradycardia, unspecified: Secondary | ICD-10-CM | POA: Diagnosis not present

## 2019-04-20 DIAGNOSIS — N1831 Chronic kidney disease, stage 3a: Secondary | ICD-10-CM

## 2019-04-20 DIAGNOSIS — E1122 Type 2 diabetes mellitus with diabetic chronic kidney disease: Secondary | ICD-10-CM | POA: Diagnosis not present

## 2019-04-20 DIAGNOSIS — E785 Hyperlipidemia, unspecified: Secondary | ICD-10-CM

## 2019-04-20 DIAGNOSIS — N183 Chronic kidney disease, stage 3 unspecified: Secondary | ICD-10-CM | POA: Diagnosis not present

## 2019-04-20 DIAGNOSIS — Z794 Long term (current) use of insulin: Secondary | ICD-10-CM | POA: Diagnosis not present

## 2019-04-20 DIAGNOSIS — IMO0002 Reserved for concepts with insufficient information to code with codable children: Secondary | ICD-10-CM

## 2019-04-20 DIAGNOSIS — E1165 Type 2 diabetes mellitus with hyperglycemia: Secondary | ICD-10-CM | POA: Diagnosis not present

## 2019-04-20 LAB — BAYER DCA HB A1C WAIVED: HB A1C (BAYER DCA - WAIVED): 9.5 % — ABNORMAL HIGH (ref <7.0)

## 2019-04-20 MED ORDER — VICTOZA 18 MG/3ML ~~LOC~~ SOPN: PEN_INJECTOR | SUBCUTANEOUS | 6 refills | Status: DC

## 2019-04-20 NOTE — Assessment & Plan Note (Signed)
56 today.  Will continue to follow.  Not taking a beta blocker

## 2019-04-20 NOTE — Assessment & Plan Note (Addendum)
Hgb A1C is 9.5%, down from 10.6%  [facility] formulary only covers Victoza and Ozempic.  Intolerant to Ozempic  Discussed the Victoza is daily, he is willing to try.  Instructed to take .6 and increase to 1.2 mg.

## 2019-04-20 NOTE — Assessment & Plan Note (Signed)
Taking 20 mg of Lisinopril with stable kidney functions.  BP a little now today but pt denies dizzyness or syncope.

## 2019-04-20 NOTE — Progress Notes (Signed)
BP (!) 96/57 (BP Location: Left Arm, Patient Position: Sitting, Cuff Size: Normal)   Pulse (!) 56   Ht 5\' 7"  (1.702 m)   Wt 222 lb (100.7 kg)   BMI 34.77 kg/m    Subjective:    Patient ID: Scott Roof., male    DOB: [birth year], 78 y.o.   MRN: [id number]  HPI: Scott Breitbach. is a 78 y.o. male  Chief Complaint  Patient presents with  . Diabetes   Diabetes: Poor control with last Hgb A1C 10.6.  Tried to start a GLP1 with Ozempic caused intolerable GI problems and unable to afford Trulicity.  I can't find notes where he went to the [place] through [place] but pt states he was there 2 weeks ago.  They don't carry the Trulicity and wants to know an alternative. Taking 40 units of Lantus, up from 36 which has helped.    Review of the [facility] formularly shows Victoza and Ozempic are the only GLP1 medications on the formulary list. Other GLP 1's such as Bydureon and Trulicity are not    Using medications without difficulties No hypoglycemic episodes No hyperglycemic episodes Feet problems: none Blood Sugars averaging: 135 eye exam within last year Last Hgb A1C: 10.6  Hypertension  Using medications without difficulty Average home BPs "most of the time normal (120/80)"   Using medication without problems or lightheadedness No chest pain with exertion or shortness of breath No Edema  Elevated Cholesterol Using medications without problems No Muscle aches  Diet: Watches what he eats and in general healthy Exercise: Works on the farm  Relevant past medical, surgical, family and social history reviewed and updated as indicated. Interim medical history since our last visit reviewed. Allergies and medications reviewed and updated.  Review of Systems  Constitutional: Negative.   Respiratory: Negative.   Cardiovascular: Negative.   Psychiatric/Behavioral: Negative.     Per HPI unless specifically indicated above     Objective:    BP (!) 96/57 (BP Location:  Left Arm, Patient Position: Sitting, Cuff Size: Normal)   Pulse (!) 56   Ht 5\' 7"  (1.702 m)   Wt 222 lb (100.7 kg)   BMI 34.77 kg/m   Wt Readings from Last 3 Encounters:  04/20/19 222 lb (100.7 kg)  06/27/18 230 lb (104.3 kg)  03/23/18 233 lb (105.7 kg)    Physical Exam Constitutional:      General: He is not in acute distress.    Appearance: Normal appearance. He is well-developed.  HENT:     Head: Normocephalic and atraumatic.  Eyes:     General: Lids are normal. No scleral icterus.       Right eye: No discharge.        Left eye: No discharge.     Conjunctiva/sclera: Conjunctivae normal.  Neck:     Musculoskeletal: Normal range of motion and neck supple.     Vascular: No carotid bruit or JVD.  Cardiovascular:     Rate and Rhythm: Normal rate and regular rhythm.     Heart sounds: Normal heart sounds.  Pulmonary:     Effort: Pulmonary effort is normal. No respiratory distress.     Breath sounds: Normal breath sounds.  Abdominal:     Palpations: There is no hepatomegaly or splenomegaly.  Musculoskeletal: Normal range of motion.  Skin:    General: Skin is warm and dry.     Coloration: Skin is not pale.     Findings: No rash.  Neurological:     Mental Status: He is alert and oriented to person, place, and time.  Psychiatric:        Behavior: Behavior normal.        Thought Content: Thought content normal.        Judgment: Judgment normal.     Results for orders placed or performed in visit on 01/12/19  Lipid Panel Piccolo, [employer]  Result Value Ref Range   Cholesterol Piccolo, Waived 179 <200 mg/dL   HDL Chol Piccolo, Waived 54 (L) >59 mg/dL   Triglycerides Piccolo,Waived 342 (H) <150 mg/dL   Chol/HDL Ratio Piccolo,Waive 3.3 mg/dL   LDL Chol Calc Piccolo Waived 57 <100 mg/dL   VLDL Chol Calc Piccolo,Waive 68 (H) <30 mg/dL  Comprehensive metabolic panel  Result Value Ref Range   Glucose 168 (H) 65 - 99 mg/dL   BUN 21 8 - 27 mg/dL   Creatinine, Ser 1.31 (H) 0.76  - 1.27 mg/dL   GFR calc non Af Amer 52 (L) >59 mL/min/1.73   GFR calc Af Amer 60 >59 mL/min/1.73   BUN/Creatinine Ratio 16 10 - 24   Sodium 144 134 - 144 mmol/L   Potassium 3.9 3.5 - 5.2 mmol/L   Chloride 105 96 - 106 mmol/L   CO2 20 20 - 29 mmol/L   Calcium 8.6 8.6 - 10.2 mg/dL   Total Protein 6.4 6.0 - 8.5 g/dL   Albumin 4.1 3.7 - 4.7 g/dL   Globulin, Total 2.3 1.5 - 4.5 g/dL   Albumin/Globulin Ratio 1.8 1.2 - 2.2   Bilirubin Total 0.6 0.0 - 1.2 mg/dL   Alkaline Phosphatase 72 39 - 117 IU/L   AST 13 0 - 40 IU/L   ALT 15 0 - 44 IU/L  Bayer DCA Hb A1c Waived  Result Value Ref Range   HB A1C (BAYER DCA - WAIVED) 10.6 (H) <7.0 %      Assessment & Plan:   Problem List Items Addressed This Visit      Unprioritized   Bradycardia    56 today.  Will continue to follow.  Not taking a beta blocker      Hyperlipidemia associated with type 2 diabetes mellitus ([place])    Taking high dose statin.  Difficult to know LDL with non-fasting today and high Triglycerides in the past      Relevant Medications   liraglutide (VICTOZA) 18 MG/3ML SOPN   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HCC)    Taking 20 mg of Lisinopril with stable kidney functions.  BP a little now today but pt denies dizzyness or syncope.        Uncontrolled type 2 diabetes mellitus with chronic kidney disease ([place]) - Primary    Hgb A1C is 9.5%, down from 10.6%  VA formulary only covers Victoza and Ozempic.  Intolerant to Ozempic  Discussed the Victoza is daily, he is willing to try.  Instructed to take .6 and increase to 1.2 mg.         Relevant Medications   liraglutide (VICTOZA) 18 MG/3ML SOPN   Other Relevant Orders   Bayer DCA Hb A1c Waived   Comprehensive metabolic panel      Sent Victoza rx to [place] [facility] in [place]  Follow up plan: Return in about 3 months (around 07/21/2019).

## 2019-04-20 NOTE — Assessment & Plan Note (Signed)
Taking high dose statin.  Difficult to know LDL with non-fasting today and high Triglycerides in the past

## 2019-04-21 ENCOUNTER — Telehealth: Payer: Self-pay | Admitting: Nurse Practitioner

## 2019-04-21 LAB — COMPREHENSIVE METABOLIC PANEL
ALT: 16 IU/L (ref 0–44)
AST: 11 IU/L (ref 0–40)
Albumin/Globulin Ratio: 2.1 (ref 1.2–2.2)
Albumin: 4.6 g/dL (ref 3.7–4.7)
Alkaline Phosphatase: 80 IU/L (ref 39–117)
BUN/Creatinine Ratio: 20 (ref 10–24)
BUN: 25 mg/dL (ref 8–27)
Bilirubin Total: 0.5 mg/dL (ref 0.0–1.2)
CO2: 23 mmol/L (ref 20–29)
Calcium: 9.7 mg/dL (ref 8.6–10.2)
Chloride: 99 mmol/L (ref 96–106)
Creatinine, Ser: 1.28 mg/dL — ABNORMAL HIGH (ref 0.76–1.27)
GFR calc Af Amer: 62 mL/min/{1.73_m2} (ref >59)
GFR calc non Af Amer: 54 mL/min/{1.73_m2} — ABNORMAL LOW (ref >59)
Globulin, Total: 2.2 g/dL (ref 1.5–4.5)
Glucose: 491 mg/dL — ABNORMAL HIGH (ref 65–99)
Potassium: 4.8 mmol/L (ref 3.5–5.2)
Sodium: 135 mmol/L (ref 134–144)
Total Protein: 6.8 g/dL (ref 6.0–8.5)

## 2019-04-21 NOTE — Telephone Encounter (Signed)
Pt states that liraglutide ([place]) 18 MG/3ML SOPN was sent to the [facility] for him.  Pt states that the [place] is telling him that to fill medication they need his medical records for review. [facility] told pt that medical records need to be faxed to:  [phone]

## 2019-04-24 NOTE — Telephone Encounter (Signed)
I could see where they may need the most recent note from [place], just so they can have some evidence to supply him Victoza from their pharmacy?  Unsure about needing the entire medical record. I'm not as familiar with the process for what the [place] providers generally want/need to take over prescribing of the medications

## 2019-04-24 NOTE — Telephone Encounter (Signed)
Catie, do you know why they need his medical records to fill Victozia?

## 2019-04-25 NOTE — Telephone Encounter (Signed)
Note faxed.

## 2019-06-02 ENCOUNTER — Other Ambulatory Visit: Payer: Self-pay | Admitting: Nurse Practitioner

## 2019-07-13 ENCOUNTER — Ambulatory Visit
Admission: EM | Admit: 2019-07-13 | Discharge: 2019-07-13 | Disposition: A | Discharge status: Home / Self Care | Payer: Medicare HMO | Attending: Urgent Care | Admitting: Urgent Care

## 2019-07-13 ENCOUNTER — Ambulatory Visit (INDEPENDENT_AMBULATORY_CARE_PROVIDER_SITE_OTHER): Payer: Medicare HMO | Admitting: Nurse Practitioner

## 2019-07-13 ENCOUNTER — Other Ambulatory Visit: Payer: Self-pay

## 2019-07-13 ENCOUNTER — Encounter: Payer: Self-pay | Admitting: Nurse Practitioner

## 2019-07-13 ENCOUNTER — Ambulatory Visit: Payer: Self-pay | Admitting: *Deleted

## 2019-07-13 ENCOUNTER — Encounter: Payer: Self-pay | Admitting: Emergency Medicine

## 2019-07-13 DIAGNOSIS — R198 Other specified symptoms and signs involving the digestive system and abdomen: Secondary | ICD-10-CM | POA: Diagnosis not present

## 2019-07-13 DIAGNOSIS — R195 Other fecal abnormalities: Secondary | ICD-10-CM

## 2019-07-13 LAB — CBC
HCT: 38.7 % — ABNORMAL LOW (ref 39.0–52.0)
Hemoglobin: 12.8 g/dL — ABNORMAL LOW (ref 13.0–17.0)
MCH: 29.9 pg (ref 26.0–34.0)
MCHC: 33.1 g/dL (ref 30.0–36.0)
MCV: 90.4 fL (ref 80.0–100.0)
Platelets: 165 10*3/uL (ref 150–400)
RBC: 4.28 MIL/uL (ref 4.22–5.81)
RDW: 12.5 % (ref 11.5–15.5)
WBC: 6.2 10*3/uL (ref 4.0–10.5)
nRBC: 0 % (ref 0.0–0.2)

## 2019-07-13 LAB — URINALYSIS, COMPLETE (UACMP) WITH MICROSCOPIC
Bilirubin Urine: NEGATIVE
Glucose, UA: 500 mg/dL — AB
Hgb urine dipstick: NEGATIVE
Ketones, ur: NEGATIVE mg/dL
Leukocytes,Ua: NEGATIVE
Nitrite: NEGATIVE
Protein, ur: NEGATIVE mg/dL
RBC / HPF: NONE SEEN RBC/hpf (ref 0–5)
Specific Gravity, Urine: 1.015 (ref 1.005–1.030)
pH: 5 (ref 5.0–8.0)

## 2019-07-13 LAB — BASIC METABOLIC PANEL
Anion gap: 8 (ref 5–15)
BUN: 23 mg/dL (ref 8–23)
CO2: 25 mmol/L (ref 22–32)
Calcium: 8.8 mg/dL — ABNORMAL LOW (ref 8.9–10.3)
Chloride: 101 mmol/L (ref 98–111)
Creatinine, Ser: 1.16 mg/dL (ref 0.61–1.24)
GFR calc Af Amer: >60 mL/min (ref >60)
GFR calc non Af Amer: 60 mL/min — ABNORMAL LOW (ref >60)
Glucose, Bld: 212 mg/dL — ABNORMAL HIGH (ref 70–99)
Potassium: 4 mmol/L (ref 3.5–5.1)
Sodium: 134 mmol/L — ABNORMAL LOW (ref 135–145)

## 2019-07-13 LAB — OCCULT BLOOD X 1 CARD TO LAB, STOOL: Fecal Occult Bld: NEGATIVE

## 2019-07-13 MED ORDER — SUCRALFATE 1 G PO TABS: 1.0000 g | ORAL_TABLET | Freq: Two times a day (BID) | ORAL | 0 refills | Status: DC

## 2019-07-13 NOTE — Discharge Instructions (Addendum)
It was very nice seeing you today in clinic. Thank you for entrusting me with your care.   Labs looked good. No blood in stools.. Monitor stools for recurrence and follow up with your doctor to discuss possible referral to GI.   If your symptoms/condition worsens, please seek follow up care either here or in the ER. Please remember, our [place] providers are "right here with you" when you need [place].   Again, it was my pleasure to take care of you today. Thank you for choosing our clinic. I hope that you start to feel better quickly.   Honor Loh, MSN, APRN, FNP-C, CEN Advanced Practice Provider [facility]

## 2019-07-13 NOTE — Patient Instructions (Signed)
Bloody Diarrhea Bloody diarrhea is frequent loose and watery bowel movements that contain blood. The blood can be hard to see or notice (occult). Bloody diarrhea may be caused by medical conditions such as:  Ulcerative colitis.  Crohn's disease.  Intestinal infection.  Viral gastroenteritis or bacterial gastroenteritis. Finding out why there is blood in your diarrhea is necessary so that your health care provider can prescribe the right treatment for you. Follow the instructions from your health care provider about treating the cause of your bloody diarrhea. Any type of diarrhea can make you feel weak and dehydrated. Dehydration can make you tired and thirsty, cause you to have a dry mouth, and decrease how often you urinate. Follow these instructions at home: Eating and drinking     Follow these recommendations as told by your health care provider:  Take an oral rehydration solution (ORS). This is an over-the-counter medicine that helps return your body to its normal balance of nutrients and water. It is found at pharmacies and retail stores.  Drink enough fluid to keep your urine pale yellow. ? Drink fluids such as water, ice chips, diluted fruit juice, and low-calorie sports drinks. You can also drink milk products, if desired. ? Avoid drinking fluids that contain a lot of sugar or caffeine, such as energy drinks, regular sports drinks, and soda. ? Avoid alcohol.  Eat bland, easy-to-digest foods in small amounts as you are able. These foods include bananas, applesauce, rice, lean meats, toast, and crackers.  Avoid spicy or fatty foods.  Medicines  Take over-the-counter and prescription medicines only as told by your health care provider. ? Your health care provider may prescribe medicine to slow down the frequency of diarrhea or to ease stomach discomfort.  If you were prescribed an antibiotic medicine, take it as told by your health care provider. Do not stop using the  antibiotic even if you start to feel better. General instructions   Wash your hands often using soap and water. If soap and water are not available, use a hand sanitizer. Others in the household should wash their hands as well. Hands should be washed: ? After using the toilet or changing a diaper. ? Before preparing, cooking, or serving food. ? While caring for a sick person or while visiting someone in a hospital.  Rest at home while you recover.  Take a warm bath to relieve any burning or pain from frequent diarrhea episodes.  Watch your condition for any changes.  Keep all follow-up visits as told by your health care provider. This is important. Contact a health care provider if:  You have a fever.  Your diarrhea gets worse.  You have new symptoms.  You cannot keep fluids down.  You feel light-headed or dizzy.  You have a headache.  You have muscle cramps. Get help right away if:  You have chest pain.  You feel extremely weak or you faint.  The blood in your diarrhea increases or turns a different color.  You vomit and the vomit is bloody or looks black.  You have persistent diarrhea.  You have severe pain, cramping, or bloating in your abdomen.  You have trouble breathing or you are breathing very quickly.  Your heart is beating very quickly.  Your skin feels cold and clammy.  You feel confused.  You have signs of dehydration, such as: ? Dark urine, very little urine, or no urine. ? Cracked lips. ? Dry mouth. ? Sunken eyes. ? Sleepiness. ? Weakness. Summary  Bloody diarrhea is frequent loose and watery bowel movements that contain blood. The blood can be hard to see or notice (occult).  Follow the instructions from your health care provider about treating the cause of your bloody diarrhea.  Any type of diarrhea can make you feel weak and dehydrated.  Follow your health care provider's recommendations for eating and drinking and for taking  medicines.  Contact your health care provider if your symptoms get worse. Get help right away if you have signs of dehydration. This information is not intended to replace advice given to you by your health care provider. Make sure you discuss any questions you have with your health care provider. Document Revised: 11/11/2017 Document Reviewed: 11/11/2017 Elsevier Patient Education  2020 Elsevier Inc.  

## 2019-07-13 NOTE — Assessment & Plan Note (Addendum)
Acute with "uneasy" abdominal discomfort x 3 weeks and recent dark stools over past few days. Concern for GI bleed due to age and use of Meloxicam and ASA.  Have recommended he stop taking these for now until further assessment and direction from provider.  Continue PPI and will send in for Carafate.  Recommend he be seen today in ER or UC setting for face to face evaluation + labs and imaging.  He agrees with this POC.  Will await face to face visit and determine next steps, consider GI referral.  Return in one week, sooner if worsening.

## 2019-07-13 NOTE — Progress Notes (Signed)
There were no vitals taken for this visit.   Subjective:    Patient ID: Scott Roof., male    DOB: [birth year], 80 y.o.   MRN: [id number]  HPI: Scott Peyser. is a 79 y.o. male  Chief Complaint  Patient presents with  . Stool Color Change    pt states he has been having stomach issues and dark stool, states the stomach issues have been going on for 4 to 5 weeks and dark stools for about 4 days      . This visit was completed via telephone due to the restrictions of the COVID-19 pandemic. All issues as above were discussed and addressed but no physical exam was performed. If it was felt that the patient should be evaluated in the office, they were directed there. The patient verbally consented to this visit. Patient was unable to complete an audio/visual visit due to Lack of equipment. Due to the catastrophic nature of the COVID-19 pandemic, this visit was done through audio contact only. . Location of the patient: home . Location of the provider: work . Those involved with this call:  . Provider: Marnee Guarneri, DNP . CMA: Yvonna Alanis, CMA . Front Desk/Registration: Don Perking  . Time spent on call: 15 minutes on the phone discussing health concerns. 10 minutes total spent in review of patient's record and preparation of their chart.  . I verified patient identity using two factors (patient name and date of birth). Patient consents verbally to being seen via telemedicine visit today.    ABDOMINAL PAIN  Has been having a few stomach problems he reports for a few weeks.  States this is not pain, but feels like something did not agree with him.  He started to notice dark stool on Tuesday and Wednesday + dark this morning.  Denies difficulty passing the stool or straining.  No urinary symptoms.  He is taking daily Meloxicam and ASA daily, have instructed him to stop taking these starting today and not continue use until further evaluation.  He is on PPI and  recommended he continue this.  Denies current iron supplements or use of Pepto Bismol.  He has had similar symptoms before when had UTI. Duration:days Onset: gradual Severity: feels unsettled, but denies pain Quality: aching and ill-defined Location:  diffuse  Episode duration:  Radiation: no Frequency: intermittent Alleviating factors:  Aggravating factors: Status: fluctuating Treatments attempted: PPI Fever: no Nausea: no Vomiting: no Weight loss: yes, but has been trying, lost 4-5 pounds Decreased appetite: yes Diarrhea: a little bit loose stool Constipation: no Blood in stool: reports dark stool, started yesterday Heartburn: no Jaundice: no Rash: no Dysuria/urinary frequency: no Hematuria: no History of sexually transmitted disease: no Recurrent NSAID use: yes  Relevant past medical, surgical, family and social history reviewed and updated as indicated. Interim medical history since our last visit reviewed. Allergies and medications reviewed and updated.  Review of Systems  Constitutional: Negative for activity change, appetite change, chills, fatigue, fever and unexpected weight change.  HENT: Negative.   Respiratory: Negative.   Cardiovascular: Negative.   Gastrointestinal: Positive for diarrhea (a little bit loose stool since yesterday). Negative for abdominal distention, abdominal pain (denies pain, just an uneasy feeling), blood in stool (although stool is dark), constipation, nausea and vomiting.  Neurological: Negative.   Psychiatric/Behavioral: Negative.     Per HPI unless specifically indicated above     Objective:    There were no vitals taken for this visit.  Wt Readings from Last 3 Encounters:  07/13/19 200 lb (90.7 kg)  04/20/19 222 lb (100.7 kg)  06/27/18 230 lb (104.3 kg)    Physical Exam   Unable to obtain due to telephone visit only.    Results for orders placed or performed in visit on 04/20/19  Bayer DCA Hb A1c Waived  Result Value Ref  Range   HB A1C (BAYER DCA - WAIVED) 9.5 (H) <7.0 %  Comprehensive metabolic panel  Result Value Ref Range   Glucose 491 (H) 65 - 99 mg/dL   BUN 25 8 - 27 mg/dL   Creatinine, Ser 1.28 (H) 0.76 - 1.27 mg/dL   GFR calc non Af Amer 54 (L) >59 mL/min/1.73   GFR calc Af Amer 62 >59 mL/min/1.73   BUN/Creatinine Ratio 20 10 - 24   Sodium 135 134 - 144 mmol/L   Potassium 4.8 3.5 - 5.2 mmol/L   Chloride 99 96 - 106 mmol/L   CO2 23 20 - 29 mmol/L   Calcium 9.7 8.6 - 10.2 mg/dL   Total Protein 6.8 6.0 - 8.5 g/dL   Albumin 4.6 3.7 - 4.7 g/dL   Globulin, Total 2.2 1.5 - 4.5 g/dL   Albumin/Globulin Ratio 2.1 1.2 - 2.2   Bilirubin Total 0.5 0.0 - 1.2 mg/dL   Alkaline Phosphatase 80 39 - 117 IU/L   AST 11 0 - 40 IU/L   ALT 16 0 - 44 IU/L      Assessment & Plan:   Problem List Items Addressed This Visit      Other   Dark stools - Primary    Acute with "uneasy" abdominal discomfort x 3 weeks and recent dark stools over past few days. Concern for GI bleed due to age and use of Meloxicam and ASA.  Have recommended he stop taking these for now until further assessment and direction from provider.  Continue PPI and will send in for Carafate.  Recommend he be seen today in ER or UC setting for face to face evaluation + labs and imaging.  He agrees with this POC.  Will await face to face visit and determine next steps, consider GI referral.  Return in one week, sooner if worsening.         I discussed the assessment and treatment plan with the patient. The patient was provided an opportunity to ask questions and all were answered. The patient agreed with the plan and demonstrated an understanding of the instructions.   The patient was advised to call back or seek an in-person evaluation if the symptoms worsen or if the condition fails to improve as anticipated.   I provided 15 minutes of time during this encounter.  Follow up plan: Return in about 1 week (around 07/20/2019) for dark stool.

## 2019-07-13 NOTE — ED Triage Notes (Signed)
Patient in today c/o abdominal pain x 3 weeks, dark stools x 1 day. Patient has not tried any OTC medications for his symptoms. Patient states this has happened before and he had a UTI.

## 2019-07-13 NOTE — Telephone Encounter (Signed)
Patient is reporting change in color of stool- no urinary symptoms. Patient states he has dark stool- almost black that started Tuesday. Call to office for appointment.  Reason for Disposition . [1] Abnormal color is unexplained AND [2] persists > 24 hours  Answer Assessment - Initial Assessment Questions 1. COLOR: "What color is it?" "Is that color in part or all of the stool?"     Dark- black stool 2. ONSET: "When was the unusual color first noted?"     Started on Tuesday afternoon 3. CAUSE: "Have you eaten any food or taken any medicine of this color?" (See listing in BACKGROUND)     No changes in diet and medication 4. OTHER SYMPTOMS: "Do you have any other symptoms?" (e.g., diarrhea, jaundice, abdominal pain, fever).     Slight weird feeling in stomach.  Protocols used: STOOLS - UNUSUAL COLOR-A-AH

## 2019-07-14 NOTE — ED Provider Notes (Signed)
[place], [place]   Name: Scott Lawson. DOB: 09-23-1940 MRN: [id number] CSN: [id number] PCP: Venita Lick, NP  Arrival date and time:  07/13/19 1100  Chief Complaint:  Abdominal Pain and dark stools   NOTE: Prior to seeing the patient today, I have reviewed the triage nursing documentation and vital signs. Clinical staff has updated patient's PMH/PSHx, current medication list, and drug allergies/intolerances to ensure comprehensive history available to assist in medical decision making.   History:   HPI: Scott Lawson. is a 79 y.o. male who presents today with complaints of abdominal symptoms that have been going on for the last 3 weeks. Patient denies pain in his abdomen, however reports that he has a "stomach ache" off and on for the past 3 weeks. Patient states, "it is not pain. I cannot explain it. It is a weird and crazy feeling. It kind of feels like you feel when you eat a green apple". Patient denies any nausea and vomiting. Acute change in stool color and consistency started yesterday. He notes that hie stools have been dark and more loose. Stools are not described to be tarry. Patient denies being able to appreciate any BRBPR. Patient is on daily NSAID with appropriate concurrent PPI and sulcrulfate. No ETOH intake. Patient reporting similar episodes like this in the past when he had a urinary tract infection. Patient denies urinary symptoms; no dysuria, frequency, urgency, or gross hematuria. Patient reports that he is voiding normally with no acute changes to his urinary stream. Patient denies fevers, chills, chest pain, shortness of breath, palpitations, and vertiginous symptoms. He is eating and drinking well; denies recent dietary changes that would cause stool to change colors. Patient does not take any sort of oral iron supplementation. He does endorse using bismuth subsalicylate 1-2 weeks ago. UTD on colonoscopy; last 2012.   Past Medical History:  Diagnosis Date   . BPH (benign prostatic hyperplasia)   . Diabetes mellitus without complication ([place])   . Elevated PSA   . GERD (gastroesophageal reflux disease)   . Hypertension   . Hypertriglyceridemia   . Nocturia     Past Surgical History:  Procedure Laterality Date  . APPENDECTOMY    . CARPAL TUNNEL RELEASE Left 12/11/2015   Procedure: CARPAL TUNNEL RELEASE;  Surgeon: Earnestine Leys, MD;  Location: [facility] ORS;  Service: Orthopedics;  Laterality: Left;  . EYE SURGERY     laser  . TRANSURETHRAL RESECTION OF PROSTATE N/A 08/02/2017   Procedure: TRANSURETHRAL RESECTION OF THE PROSTATE (TURP);  Surgeon: Hollice Espy, MD;  Location: [facility] ORS;  Service: Urology;  Laterality: N/A;    Family History  Problem Relation Age of Onset  . Cancer Mother        lung  . Heart disease Father   . Diabetes Brother   . Prostate cancer Neg Hx   . Bladder Cancer Neg Hx   . Kidney cancer Neg Hx     Social History   Tobacco Use  . Smoking status: Never Smoker  . Smokeless tobacco: Never Used  Substance Use Topics  . Alcohol use: No    Alcohol/week: 0.0 standard drinks  . Drug use: No    Patient Active Problem List   Diagnosis Date Noted  . Dark stools 07/13/2019  . Bradycardia 04/20/2019  . Vitamin B12 deficiency 06/28/2018  . Prostate cancer ([place]) 06/27/2018  . Psoriasis 08/16/2017  . Advanced care planning/counseling discussion 12/01/2016  . GERD (gastroesophageal reflux disease) 08/14/2015  . 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 CKD (chronic kidney disease), stage III ([place]) 02/11/2015  . Hyperlipidemia associated with type 2 diabetes mellitus ([place]) 02/11/2015  . BPH (benign prostatic hyperplasia) 02/11/2015    Home Medications:    Current Meds  Medication Sig  . atorvastatin (LIPITOR) 40 MG tablet Take 1 tablet (40 mg total) by mouth daily.  .  fluticasone (FLONASE) 50 MCG/ACT nasal spray Place 2 sprays into both nostrils daily as needed.   . insulin glargine (LANTUS) 100 UNIT/ML injection Inject 40 Units into the skin every evening. 1800  . liraglutide (VICTOZA) 18 MG/3ML SOPN Start 0.6mg  SQ once a day for 14 days, then increase to 1.2mg  once a day  . lisinopril (ZESTRIL) 20 MG tablet Take 0.5 tablets (10 mg total) by mouth daily.  . metFORMIN (GLUCOPHAGE) 500 MG tablet TAKE 1 TABLET (500 MG TOTAL) BY MOUTH 2 (TWO) TIMES DAILY WITH A MEAL.  Marland Kitchen omeprazole (PRILOSEC) 20 MG capsule TAKE 1 CAPSULE (20 MG TOTAL) BY MOUTH DAILY.  Marland Kitchen sucralfate (CARAFATE) 1 g tablet Take 1 tablet (1 g total) by mouth 2 (two) times daily.  . tamsulosin (FLOMAX) 0.4 MG CAPS capsule TAKE 1 CAPSULE EVERY DAY  . TRUE METRIX BLOOD GLUCOSE TEST test strip CHECK BLOOD SUGAR TWICE DAILY  . TRUEplus Lancets 33G MISC CHECK BLOOD SUGAR TWICE DAILY    Allergies:   Gabapentin  Review of Systems (ROS): Review of Systems  Constitutional: Negative for fatigue and fever.  HENT: Negative for congestion, ear pain, postnasal drip, rhinorrhea, sinus pressure, sinus pain, sneezing and sore throat.   Eyes: Negative for pain, discharge and redness.  Respiratory: Negative for cough, chest tightness and shortness of breath.   Cardiovascular: Negative for chest pain and palpitations.  Gastrointestinal: Negative for abdominal distention, abdominal pain, anal bleeding, blood in stool, constipation, diarrhea, nausea and vomiting.       (+) "Weird crazy" sensation in stomach; no pain. (+) dark stools; no tarry stools or BRBPR.   Endocrine:       PMH (+) for diabetes  Genitourinary: Negative for dysuria, flank pain, frequency, hematuria and urgency.  Musculoskeletal: Negative for arthralgias, back pain, myalgias and neck pain.  Skin: Negative for color change, pallor and rash.  Neurological: Negative for dizziness, syncope, weakness and headaches.  Hematological: Negative for  adenopathy.     Vital Signs: Today's Vitals   07/13/19 1113 07/13/19 1114 07/13/19 1230  BP:  (!) 145/63   Pulse:  (!) 58   Resp:  18   Temp:  97.7 F (36.5 C)   TempSrc:  Oral   SpO2:  97%   Weight: 200 lb (90.7 kg)    Height: 5\' 7"  (1.702 m)    PainSc: 3   3     Physical Exam: Physical Exam  Constitutional: He is oriented to person, place, and time and well-developed, well-nourished, and in no distress.  HENT:  Head: Normocephalic and atraumatic.  Eyes: Pupils are equal, round, and reactive to light.  Cardiovascular: Normal rate, regular rhythm, normal heart sounds and intact distal pulses.  Pulmonary/Chest: Effort normal and breath sounds normal.  Abdominal: Soft. Normal appearance, normal aorta and bowel sounds are normal. He exhibits no distension. There is no hepatosplenomegaly. There is no abdominal tenderness. There is no CVA tenderness.  Genitourinary:    Genitourinary Comments: DRE grossly normal. No evidence of hematochezia or melenic  stool. No hemorrhoids. No rectal/peri-rectal tenderness.    Musculoskeletal:     Cervical back: Normal range of motion and neck supple.  Lymphadenopathy:    He has no cervical adenopathy.  Neurological: He is alert and oriented to person, place, and time. Gait normal.  Skin: Skin is warm and dry. No rash noted. He is not diaphoretic.  Psychiatric: Mood, memory, affect and judgment normal.  Nursing note and vitals reviewed.   Urgent Care Treatments / Results:   Orders Placed This Encounter  Procedures  . Urinalysis, Complete w Microscopic  . Occult blood card to lab, stool  . CBC  . Basic metabolic panel    LABS: PLEASE NOTE: all labs that were ordered this encounter are listed, however only abnormal results are displayed. Labs Reviewed  URINALYSIS, COMPLETE (UACMP) WITH MICROSCOPIC - Abnormal; Notable for the following components:      Result Value   Glucose, UA 500 (*)    Bacteria, UA RARE (*)    All other components  within normal limits  CBC - Abnormal; Notable for the following components:   Hemoglobin 12.8 (*)    HCT 38.7 (*)    All other components within normal limits  BASIC METABOLIC PANEL - Abnormal; Notable for the following components:   Sodium 134 (*)    Glucose, Bld 212 (*)    Calcium 8.8 (*)    GFR calc non Af Amer 60 (*)    All other components within normal limits  OCCULT BLOOD X 1 CARD TO LAB, STOOL    EKG: -None  RADIOLOGY: No results found.  PROCEDURES: Procedures  MEDICATIONS RECEIVED THIS VISIT: Medications - No data to display  PERTINENT CLINICAL COURSE NOTES/UPDATES:   Initial Impression / Assessment and Plan / Urgent Care Course:  Pertinent labs & imaging results that were available during my care of the patient were personally reviewed by me and considered in my medical decision making (see lab/imaging section of note for values and interpretations).  Princess Bruins Jolin [name] is a 79 y.o. male who presents to [facility] today with complaints of Abdominal Pain and dark stools  Patient is well appearing overall in clinic today. He does not appear to be in any acute distress. Presenting symptoms (see HPI) and exam as documented above. Patient vague in his description of his abdominal complaints. He is unable to characterize what her is feeling beyond it being "weird and crazy" and like "green apple belly". Patient adamantly denies pain. Abdominal exam is benign. DRE revealed no pain or evidence of bleeding. No fevers or chills. No urinary symptoms. He is concerned today about an acute change in the color of his stool. He has had this in the past when he had a urinary tract infection. Patient sent to urgent care by PCP for further evaluation.    LABS performed and reviewed as follows:  FOBT x 1 (-)   WBC 6.2. Hemoglobin 12.8, hematocrit 38.7, MCV 90.4, MCH 29.9, and platelets 165.    Na 134, K+ 4.0, glucose 212. BUN 23 and creatinine 1.16. Estimated Creatinine  Clearance: 56.3 mL/min (by C-G formula based on SCr of 1.16 mg/dL). GFR 60 mL/min.    UA negative for infection; 0-5 WBC/hpf, 0 RBC/hpf, rare bacteria, with no nitrites or LE. (+) 500 mg/dL glucose.   Results reviewed with patient. Suspect symptoms are related to dietary intake, as workup is negative here. Discussed various foods that can cause stools to change colors. Patient encouraged to be more  aware of what he is eating over the next few days to see if he is able to determine potential causative intake. Reassurance provided. Patient advised to monitor for resolution over the next few days. He was encouraged to follow up with PCP for recurrence. Persistent symptoms would benefit from further evaluation by gastroenterology for consideration of EGD and colonoscopy. Patient verbalized understanding.   I have reviewed the follow up and strict return precautions for any new or worsening symptoms. Patient is aware of symptoms that would be deemed urgent/emergent, and would thus require further evaluation either here or in the emergency department. At the time of discharge, he verbalized understanding and consent with the discharge plan as it was reviewed with him. All questions were fielded by provider and/or clinic staff prior to patient discharge.    Final Clinical Impressions / Urgent Care Diagnoses:   Final diagnoses:  Abdominal complaints  Dark stools    New Prescriptions:  [place] Controlled Substance Registry consulted? Not Applicable  No orders of the defined types were placed in this encounter.   Recommended Follow up Care:  Patient encouraged to follow up with the following provider within the specified time frame, or sooner as dictated by the severity of his symptoms. As always, he was instructed that for any urgent/emergent care needs, he should seek care either here or in the emergency department for more immediate evaluation.  Follow-up Information    Marnee Guarneri T, NP In 1 week.    Specialty: Nurse Practitioner Why: General reassessment of symptoms if not improving Contact information: [address] [address] [phone]         NOTE: This note was prepared using Dragon dictation software along with smaller phrase technology. Despite my best ability to proofread, there is the potential that transcriptional errors may still occur from this process, and are completely unintentional.    Karen Kitchens, NP 07/14/19 1706

## 2019-07-19 ENCOUNTER — Telehealth: Payer: Self-pay | Admitting: Nurse Practitioner

## 2019-07-19 NOTE — Chronic Care Management (AMB) (Signed)
  Care Management   Note  07/19/2019 Name: Scott Lawson. MRN: [id number] DOB: 1940-12-08  Princess Bruins [name] is a 79 y.o. year old male who is a primary care patient of Venita Lick, NP and is actively engaged with the care management team. I reached out to [name] by phone today to assist with re-scheduling a follow up visit with the Pharmacist  Follow up plan: Telephone appointment with care management team member scheduled for:07/28/2019  Noreene Larsson, [place], [address], [place] [address] Direct Dial: [phone] [email or web address] Website: [email or web address]

## 2019-07-21 ENCOUNTER — Ambulatory Visit (INDEPENDENT_AMBULATORY_CARE_PROVIDER_SITE_OTHER): Payer: Medicare HMO | Admitting: Nurse Practitioner

## 2019-07-21 ENCOUNTER — Encounter: Payer: Self-pay | Admitting: Nurse Practitioner

## 2019-07-21 ENCOUNTER — Telehealth: Payer: Self-pay

## 2019-07-21 ENCOUNTER — Other Ambulatory Visit: Payer: Self-pay

## 2019-07-21 VITALS — BP 123/71 | HR 76 | Temp 98.3°F

## 2019-07-21 DIAGNOSIS — R195 Other fecal abnormalities: Secondary | ICD-10-CM

## 2019-07-21 DIAGNOSIS — E1122 Type 2 diabetes mellitus with diabetic chronic kidney disease: Secondary | ICD-10-CM | POA: Diagnosis not present

## 2019-07-21 DIAGNOSIS — IMO0002 Reserved for concepts with insufficient information to code with codable children: Secondary | ICD-10-CM

## 2019-07-21 DIAGNOSIS — I1310 Hypertensive heart and chronic kidney disease without heart failure, with stage 1 through stage 4 chronic kidney disease, or unspecified chronic kidney disease: Secondary | ICD-10-CM

## 2019-07-21 DIAGNOSIS — N1831 Chronic kidney disease, stage 3a: Secondary | ICD-10-CM | POA: Diagnosis not present

## 2019-07-21 DIAGNOSIS — E1169 Type 2 diabetes mellitus with other specified complication: Secondary | ICD-10-CM | POA: Diagnosis not present

## 2019-07-21 DIAGNOSIS — Z794 Long term (current) use of insulin: Secondary | ICD-10-CM | POA: Diagnosis not present

## 2019-07-21 DIAGNOSIS — C61 Malignant neoplasm of prostate: Secondary | ICD-10-CM

## 2019-07-21 DIAGNOSIS — E785 Hyperlipidemia, unspecified: Secondary | ICD-10-CM | POA: Diagnosis not present

## 2019-07-21 DIAGNOSIS — N183 Chronic kidney disease, stage 3 unspecified: Secondary | ICD-10-CM | POA: Diagnosis not present

## 2019-07-21 DIAGNOSIS — E1165 Type 2 diabetes mellitus with hyperglycemia: Secondary | ICD-10-CM | POA: Diagnosis not present

## 2019-07-21 LAB — MICROALBUMIN, URINE WAIVED
Creatinine, Urine Waived: 100 mg/dL (ref 10–300)
Microalb, Ur Waived: 30 mg/L — ABNORMAL HIGH (ref 0–19)
Microalb/Creat Ratio: <30 mg/g (ref <30)

## 2019-07-21 LAB — BAYER DCA HB A1C WAIVED: HB A1C (BAYER DCA - WAIVED): 13 % — ABNORMAL HIGH (ref <7.0)

## 2019-07-21 MED ORDER — INSULIN LISPRO (1 UNIT DIAL) 100 UNIT/ML (KWIKPEN): 10.0000 [IU] | PEN_INJECTOR | Freq: Three times a day (TID) | SUBCUTANEOUS | 5 refills | Status: DC

## 2019-07-21 MED ORDER — SUCRALFATE 1 G PO TABS: 1.0000 g | ORAL_TABLET | Freq: Two times a day (BID) | ORAL | 0 refills | Status: DC

## 2019-07-21 NOTE — Patient Instructions (Signed)

## 2019-07-21 NOTE — Progress Notes (Signed)
BP 123/71   Pulse 76   Temp 98.3 F (36.8 C) (Oral)   SpO2 98%    Subjective:    Patient ID: Scott Lawson., male    DOB: [birth year], 79 y.o.   MRN: [id number]  HPI: Scott Lawson. is a 79 y.o. male  Chief Complaint  Patient presents with  . Diabetes  . Hyperlipidemia  . Hypertension    DIABETES April A1C 9.5% in November, down from 10.6% in July.  Currently taking Metformin 500 MG BID,  Victoza 0.6MG  every 14 days (is not taking this due to cost of $55, [facility] wanted to try Ozempic with a low dose for a couple months -- but he had reactions to this before), and Lantus 40 units every evening.  Continues to see PCP at [facility] as well.  Working with CCM team in office.  Dr. Meyer Russel is provider at [facility] -- [facility] [phone]. Hypoglycemic episodes:no Polydipsia/polyuria: no Visual disturbance: no Chest pain: no Paresthesias: no Glucose Monitoring: yes             Accucheck frequency: Daily             Fasting glucose: 120 average fasting, 212 this morning after he ate             Post prandial: 200 to 300 after eating on average             Evening:             Before meals: Taking Insulin?: yes             Long acting insulin: Lantus 40 units             Short acting insulin: Blood Pressure Monitoring: daily Retinal Examination: Not Up to Date -- [facility] in October 2019 Foot Exam: Up to Date Pneumovax: Up to Date Influenza: Up to Date Aspirin: yes   HYPERTENSION / HYPERLIPIDEMIA Continues on Lisinopril 10 MG and Lipitor 40 MG.  Satisfied with current treatment? yes Duration of hypertension: chronic BP monitoring frequency: daily BP range: 110-120/70's range  BP medication side effects: no Duration of hyperlipidemia: chronic Cholesterol medication side effects: no Cholesterol supplements: none Medication compliance: good compliance Aspirin: yes Recent stressors: no Recurrent headaches: no Visual changes: no Palpitations:  no Dyspnea: no Chest pain: no Lower extremity edema: no Dizzy/lightheaded: no   PROSTATE CANCER: Is followed at the [place] in [place] and has blood draws every 60 days.  He states they told him "too old for surgery".   He reports he lost his sister, mother, father, and brother to cancer.  States he continues to see [facility] providers for this.  Denies any current symptoms.  Continues on Tamsulosin.  DARK STOOLS Seen for this on 07/13/2019.  Was subsequently seen at [facility] for face to face.  With negative fecal occult.  CBC showing H/H 12.8/38.7, MCV 90.4, previous CBC in July 2019 noted H/H 11.9/34.9.  No UTI noted.  Denies difficulty passing the stool or straining.  No urinary symptoms.  He is currently not taking Meloxicam and ASA daily, have instructed him not continue use until further evaluation by GI.  He is on PPI and recommended he continue this + start Carafate.  Denies current iron supplements, does take B12.    This morning when he had BM the stool was back to normal.  His last black stool was yesterday.  He started taking Pepto Bismol after UC visit, was not  taking this before.  States pain is better, but "right along right side has symptoms of belly ache".  Last colonoscopy 2012. Duration:days Onset: gradual Severity: feels unsettled, but denies pain Quality: aching and ill-defined Location:  diffuse  Episode duration:  Radiation: no Frequency: intermittent Alleviating factors:  Aggravating factors: Status: fluctuating Treatments attempted: PPI Fever: no Nausea: no Vomiting: no Weight loss: yes, but has been trying, lost 4-5 pounds (on review lost 22 pounds since November--has been trying to loose this) Decreased appetite: yes Diarrhea: a little bit loose stool Constipation: no Blood in stool: reports dark stool, started yesterday Heartburn: no Jaundice: no Rash: no Dysuria/urinary frequency: no Hematuria: no History of sexually transmitted disease: no Recurrent NSAID use:  yes  Relevant past medical, surgical, family and social history reviewed and updated as indicated. Interim medical history since our last visit reviewed. Allergies and medications reviewed and updated.  Review of Systems  Constitutional: Negative for activity change, appetite change, diaphoresis, fatigue and fever.  Respiratory: Negative for cough, chest tightness and shortness of breath.   Cardiovascular: Negative for chest pain, palpitations and leg swelling.  Gastrointestinal: Positive for abdominal pain (occasional uneasy) and nausea. Negative for abdominal distention, anal bleeding, blood in stool, constipation, diarrhea, rectal pain and vomiting.  Endocrine: Negative for cold intolerance, heat intolerance, polydipsia, polyphagia and polyuria.  Neurological: Negative.   Psychiatric/Behavioral: Negative.     Per HPI unless specifically indicated above     Objective:    BP 123/71   Pulse 76   Temp 98.3 F (36.8 C) (Oral)   SpO2 98%   Wt Readings from Last 3 Encounters:  07/13/19 200 lb (90.7 kg)  04/20/19 222 lb (100.7 kg)  06/27/18 230 lb (104.3 kg)    Physical Exam Vitals and nursing note reviewed.  Constitutional:      General: He is not in acute distress.    Appearance: He is well-developed and overweight. He is not ill-appearing.  HENT:     Head: Normocephalic and atraumatic.     Right Ear: Hearing normal. No drainage.     Left Ear: Hearing normal. No drainage.     Mouth/Throat:     Pharynx: Uvula midline.  Eyes:     General: Lids are normal.        Right eye: No discharge.        Left eye: No discharge.     Conjunctiva/sclera: Conjunctivae normal.     Pupils: Pupils are equal, round, and reactive to light.  Neck:     Thyroid: No thyromegaly.     Vascular: No carotid bruit.  Cardiovascular:     Rate and Rhythm: Normal rate and regular rhythm.     Heart sounds: Normal heart sounds, S1 normal and S2 normal. No murmur. No gallop.   Pulmonary:     Effort:  Pulmonary effort is normal. No accessory muscle usage or respiratory distress.     Breath sounds: Normal breath sounds.  Abdominal:     General: Bowel sounds are normal.     Palpations: Abdomen is soft. There is no hepatomegaly or splenomegaly.     Tenderness: There is no abdominal tenderness.     Hernia: A hernia is present. Hernia is present in the umbilical area.  Musculoskeletal:        General: Normal range of motion.     Cervical back: Normal range of motion and neck supple.     Right lower leg: No edema.     Left lower leg:  No edema.  Skin:    General: Skin is warm and dry.     Capillary Refill: Capillary refill takes less than 2 seconds.  Neurological:     Mental Status: He is alert and oriented to person, place, and time.  Psychiatric:        Mood and Affect: Mood normal.        Behavior: Behavior normal.        Thought Content: Thought content normal.        Judgment: Judgment normal.    Diabetic Foot Exam - Simple   Simple Foot Form Visual Inspection See comments: Yes Sensation Testing Intact to touch and monofilament testing bilaterally: Yes Pulse Check See comments: Yes Comments DP and PT both feet 1+, mild xerosis both feet with thick, yellow toenails    Results for orders placed or performed during the hospital encounter of 07/13/19  Urinalysis, Complete w Microscopic  Result Value Ref Range   Color, Urine YELLOW YELLOW   APPearance CLEAR CLEAR   Specific Gravity, Urine 1.015 1.005 - 1.030   pH 5.0 5.0 - 8.0   Glucose, UA 500 (A) NEGATIVE mg/dL   Hgb urine dipstick NEGATIVE NEGATIVE   Bilirubin Urine NEGATIVE NEGATIVE   Ketones, ur NEGATIVE NEGATIVE mg/dL   Protein, ur NEGATIVE NEGATIVE mg/dL   Nitrite NEGATIVE NEGATIVE   Leukocytes,Ua NEGATIVE NEGATIVE   Squamous Epithelial / LPF 0-5 0 - 5   WBC, UA 0-5 0 - 5 WBC/hpf   RBC / HPF NONE SEEN 0 - 5 RBC/hpf   Bacteria, UA RARE (A) NONE SEEN   Uric Acid Crys, UA PRESENT   Occult blood card to lab,  stool  Result Value Ref Range   Fecal Occult Bld NEGATIVE NEGATIVE  CBC  Result Value Ref Range   WBC 6.2 4.0 - 10.5 K/uL   RBC 4.28 4.22 - 5.81 MIL/uL   Hemoglobin 12.8 (L) 13.0 - 17.0 g/dL   HCT 38.7 (L) 39.0 - 52.0 %   MCV 90.4 80.0 - 100.0 fL   MCH 29.9 26.0 - 34.0 pg   MCHC 33.1 30.0 - 36.0 g/dL   RDW 12.5 11.5 - 15.5 %   Platelets 165 150 - 400 K/uL   nRBC 0.0 0.0 - 0.2 %  Basic metabolic panel  Result Value Ref Range   Sodium 134 (L) 135 - 145 mmol/L   Potassium 4.0 3.5 - 5.1 mmol/L   Chloride 101 98 - 111 mmol/L   CO2 25 22 - 32 mmol/L   Glucose, Bld 212 (H) 70 - 99 mg/dL   BUN 23 8 - 23 mg/dL   Creatinine, Ser 1.16 0.61 - 1.24 mg/dL   Calcium 8.8 (L) 8.9 - 10.3 mg/dL   GFR calc non Af Amer 60 (L) >60 mL/min   GFR calc Af Amer >60 >60 mL/min   Anion gap 8 5 - 15      Assessment & Plan:   Problem List Items Addressed This Visit      Cardiovascular and Mediastinum   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HCC)    Chronic, stable with BP at goal in office and at home, no orthostatic symptoms.  Continue current medication regimen and adjust as needed.  Urine ALB 30 and A:C <30, Lisinopril for kidney protection.  CMP today.          Endocrine   Hyperlipidemia associated with type 2 diabetes mellitus (HCC)    Chronic, ongoing.  Continue  current medication regimen and adjust as needed.  Lipid panel today.      Relevant Medications   insulin lispro (HUMALOG KWIKPEN) 100 UNIT/ML KwikPen   Other Relevant Orders   Lipid Panel w/o Chol/HDL Ratio   Bayer DCA Hb A1c Waived   Uncontrolled type 2 diabetes mellitus with chronic kidney disease ([place]) - Primary    Chronic, ongoing.  A1C today 13% with urine ALB 30 and A:C <30.  Has trialed SGLT and GLP in past with poor tolerance.  Will continue Lantus 40 units daily and Metformin 500 MG BID( can not tolerate more than this).  Discussed with Alphonzo Severance PharmD and we both agree addition of  mealtime insulin would be beneficial, as is running higher during this time and above goal. Script for Humalog Flexpen to start 10 units with meals, check BS consistently with each meal, instructed him NOT to take if does not eat a meal.  Return in 4 weeks.      Relevant Medications   insulin lispro (HUMALOG KWIKPEN) 100 UNIT/ML KwikPen   Other Relevant Orders   Comprehensive metabolic panel   Microalbumin, Urine Waived   Bayer DCA Hb A1c Waived     Genitourinary   CKD (chronic kidney disease), stage III (HCC)    Chronic, stable with no decline on recent labs. Urine ALB 30 and A:C <30.  Continue Lisinopril for kidney protection.  Consider nephrology in future if any decline noted.      Relevant Orders   Comprehensive metabolic panel   CBC with Differential/Platelet   Prostate cancer ([place])    Followed by [facility] urology, continue to discuss with patient and attempt to obtain [facility] records for review.        Other   Dark stools    Acute with some improvement in stool this morning, but ongoing abdominal uneasiness.  Recent UC visit did noted some mild anemia and negative FOBT.  Have instructed to continue to hold ASA and Meloxicam, do not take + do not take Pepto Bismol. Start Carafate ordered.  Would benefit from GI referral and prefers GI at [facility] in [facility]  Have placed referral, but also reach out to [place] PCP and was requested to send all recent notes/labs to them for review and referral assistance within [place].  Recheck CBC today.  Will send these. Return for worsening or continued issues.      Relevant Orders   Ambulatory referral to Gastroenterology       Follow up plan: Return in about 4 weeks (around 08/18/2019) for Diabetes.

## 2019-07-21 NOTE — Assessment & Plan Note (Signed)
Chronic, stable with BP at goal in office and at home, no orthostatic symptoms.  Continue current medication regimen and adjust as needed.  Urine ALB 30 and A:C <30, Lisinopril for kidney protection.  CMP today.

## 2019-07-21 NOTE — Assessment & Plan Note (Signed)
Chronic, ongoing.  Continue current medication regimen and adjust as needed. Lipid panel today. 

## 2019-07-21 NOTE — Assessment & Plan Note (Signed)
Followed by [facility] urology, continue to discuss with patient and attempt to obtain [facility] records for review.

## 2019-07-21 NOTE — Assessment & Plan Note (Signed)
Chronic, stable with no decline on recent labs. Urine ALB 30 and A:C <30.  Continue Lisinopril for kidney protection.  Consider nephrology in future if any decline noted.

## 2019-07-21 NOTE — Assessment & Plan Note (Addendum)
Acute with some improvement in stool this morning, but ongoing abdominal uneasiness.  Recent UC visit did noted some mild anemia and negative FOBT.  Have instructed to continue to hold ASA and Meloxicam, do not take + do not take Pepto Bismol. Start Carafate ordered.  Would benefit from GI referral and prefers GI at [facility] in [facility]  Have placed referral, but also reach out to [place] PCP and was requested to send all recent notes/labs to them for review and referral assistance within [place].  Recheck CBC today.  Will send these. Return for worsening or continued issues.

## 2019-07-21 NOTE — Telephone Encounter (Signed)
Prior Authorization initiated via CoverMyMeds for HumaLOG KwikPen 100UNIT/ML pen-injectors Key: [id number]

## 2019-07-21 NOTE — Assessment & Plan Note (Addendum)
Chronic, ongoing.  A1C today 13% with urine ALB 30 and A:C <30.  Has trialed SGLT and GLP in past with poor tolerance.  Will continue Lantus 40 units daily and Metformin 500 MG BID( can not tolerate more than this).  Discussed with Alphonzo Severance PharmD and we both agree addition of mealtime insulin would be beneficial, as is running higher during this time and above goal. Script for Humalog Flexpen to start 10 units with meals, check BS consistently with each meal, instructed him NOT to take if does not eat a meal.  Return in 4 weeks.

## 2019-07-22 LAB — COMPREHENSIVE METABOLIC PANEL
ALT: 14 IU/L (ref 0–44)
AST: 16 IU/L (ref 0–40)
Albumin/Globulin Ratio: 1.7 (ref 1.2–2.2)
Albumin: 4.3 g/dL (ref 3.7–4.7)
Alkaline Phosphatase: 79 IU/L (ref 39–117)
BUN/Creatinine Ratio: 16 (ref 10–24)
BUN: 20 mg/dL (ref 8–27)
Bilirubin Total: 0.9 mg/dL (ref 0.0–1.2)
CO2: 24 mmol/L (ref 20–29)
Calcium: 9.6 mg/dL (ref 8.6–10.2)
Chloride: 99 mmol/L (ref 96–106)
Creatinine, Ser: 1.26 mg/dL (ref 0.76–1.27)
GFR calc Af Amer: 63 mL/min/{1.73_m2} (ref >59)
GFR calc non Af Amer: 54 mL/min/{1.73_m2} — ABNORMAL LOW (ref >59)
Globulin, Total: 2.5 g/dL (ref 1.5–4.5)
Glucose: 231 mg/dL — ABNORMAL HIGH (ref 65–99)
Potassium: 4.9 mmol/L (ref 3.5–5.2)
Sodium: 139 mmol/L (ref 134–144)
Total Protein: 6.8 g/dL (ref 6.0–8.5)

## 2019-07-22 LAB — CBC WITH DIFFERENTIAL/PLATELET
Basophils Absolute: 0.1 10*3/uL (ref 0.0–0.2)
Basos: 1 %
EOS (ABSOLUTE): 0.1 10*3/uL (ref 0.0–0.4)
Eos: 2 %
Hematocrit: 42.9 % (ref 37.5–51.0)
Hemoglobin: 14.5 g/dL (ref 13.0–17.7)
Immature Grans (Abs): 0 10*3/uL (ref 0.0–0.1)
Immature Granulocytes: 0 %
Lymphocytes Absolute: 1.9 10*3/uL (ref 0.7–3.1)
Lymphs: 26 %
MCH: 30.5 pg (ref 26.6–33.0)
MCHC: 33.8 g/dL (ref 31.5–35.7)
MCV: 90 fL (ref 79–97)
Monocytes Absolute: 0.6 10*3/uL (ref 0.1–0.9)
Monocytes: 8 %
Neutrophils Absolute: 4.5 10*3/uL (ref 1.4–7.0)
Neutrophils: 63 %
Platelets: 213 10*3/uL (ref 150–450)
RBC: 4.75 x10E6/uL (ref 4.14–5.80)
RDW: 12.4 % (ref 11.6–15.4)
WBC: 7.2 10*3/uL (ref 3.4–10.8)

## 2019-07-22 LAB — LIPID PANEL W/O CHOL/HDL RATIO
Cholesterol, Total: 200 mg/dL — ABNORMAL HIGH (ref 100–199)
HDL: 42 mg/dL (ref >39)
LDL Chol Calc (NIH): 111 mg/dL — ABNORMAL HIGH (ref 0–99)
Triglycerides: 275 mg/dL — ABNORMAL HIGH (ref 0–149)
VLDL Cholesterol Cal: 47 mg/dL — ABNORMAL HIGH (ref 5–40)

## 2019-07-23 NOTE — Progress Notes (Signed)
Please let Scott Lawson know his labs returned: - Kidney function continues to show some mild kidney disease, but no decline, so will continue to monitor. - Cholesterol levels are elevated, are you taking atorvastatin or had you ate before the visit?  I may like to go up on Atorvastatin from 40 MG to 80 MG to provider better stroke prevention and get these numbers to goal. - CBC is in normal range at this time.   I sent all notes to your [facility] primary care provider, she is aware of need for GI referral and will help work on this.  Let me know if you hear nothing from them.  Have a great day.

## 2019-07-25 ENCOUNTER — Other Ambulatory Visit: Payer: Self-pay | Admitting: Nurse Practitioner

## 2019-07-26 NOTE — Telephone Encounter (Signed)
Requested Prescriptions  Pending Prescriptions Disp Refills  . TRUE METRIX BLOOD GLUCOSE TEST test strip [Pharmacy Med Name: TRUE METRIX SELF MONITORING BLOOD GLUCOSE STRIPS   Strip] 200 strip 1    Sig: CHECK BLOOD SUGAR TWICE DAILY     Endocrinology: Diabetes - Testing Supplies Passed - 07/25/2019  9:59 PM      Passed - Valid encounter within last 12 months    Recent Outpatient Visits          5 days ago Uncontrolled type 2 diabetes mellitus with chronic kidney disease ([place])   [place] Cannady, Jolene T, NP   1 week ago Dark stools   [employer], [place] T, NP   3 months ago Uncontrolled type 2 diabetes mellitus with chronic kidney disease ([place])   [facility] Kathrine Haddock, NP   6 months ago Uncontrolled type 2 diabetes mellitus with chronic kidney disease ([place])   [place] Cannady, Jolene T, NP   10 months ago Uncontrolled type 2 diabetes mellitus with chronic kidney disease ([place])   [place], Barbaraann Faster, NP      Future Appointments            In 3 weeks Cannady, Barbaraann Faster, NP [employer]   In 4 months  [employer]

## 2019-07-28 ENCOUNTER — Telehealth: Payer: Medicare HMO

## 2019-07-28 ENCOUNTER — Ambulatory Visit (INDEPENDENT_AMBULATORY_CARE_PROVIDER_SITE_OTHER): Payer: Medicare HMO | Admitting: Pharmacist

## 2019-07-28 DIAGNOSIS — E1122 Type 2 diabetes mellitus with diabetic chronic kidney disease: Secondary | ICD-10-CM | POA: Diagnosis not present

## 2019-07-28 DIAGNOSIS — E1165 Type 2 diabetes mellitus with hyperglycemia: Secondary | ICD-10-CM

## 2019-07-28 DIAGNOSIS — N1831 Chronic kidney disease, stage 3a: Secondary | ICD-10-CM

## 2019-07-28 DIAGNOSIS — IMO0002 Reserved for concepts with insufficient information to code with codable children: Secondary | ICD-10-CM

## 2019-07-28 NOTE — Patient Instructions (Signed)
Visit Information  Goals Addressed            This Visit's Progress     Patient Stated   . "I want to manage my diabetes as best I can" (pt-stated)       Current Barriers:  . Diabetes: uncontrolled; most recent A1c 13% o Sucralfate recently added for GI ulcer . Current antihyperglycemic regimen: metformin 500 mg BID, Lantus 40, Humalog prescribed, but too expensive for patient. 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 Most recent glucose results:  o Fasting: 112 this morning;  o After breakast: 252;  o Reports most fastings 150-200s, after meals 200-300s . Glucose readings:  o Breakfast: 2 hardboiled eggs, sausage, wheat toast, coffee  . Cardiovascular risk reduction: o Current hypertensive regimen: lisinopril 10 mg daily, BP well controlled on recent visits o Current hyperlipidemia regimen: atorvastatin 40 mg daily; LDL elevated on last check, but patient was not fasting  Pharmacist Clinical Goal(s):  Marland Kitchen Over the next 90 days, patient with work with PharmD and primary care provider to address optimized medication management  Interventions: . Comprehensive medication review performed, medication list updated in electronic medical record . Reviewed sucralfate administration - patient will separate from other medications by at least 2 hours to avoid impacting absorption.  . Reviewed basal vs bolus insulin and physiologic insulin secretion patterns. Reviewed importance of obtaining bolus insulin. Patient is going to call the [place] on Monday to talk to someone about Humalog. I had him write down "Humalog" and "Novolog" as options, whichever is preferred by the [place]. I provided my direct number for him to provide to [facility] team for any questions about therapy plan. He is to call me back next week with updates. . Patient had questions about CKD and if any of his medications can cause kidney dysfunction; reviewed that  hypertension and hyperglycemia are main reasons for kidney decline, and that we prevent decline by controlling these risk factors.   Patient Self Care Activities:  . Patient will check blood glucose BID, document, and provide at future appointments . Patient will take medications as prescribed . Patient will report any questions or concerns to provider   Please see past updates related to this goal by clicking on the "Past Updates" button in the selected goal          The patient verbalized understanding of instructions provided today and declined a print copy of patient instruction materials.  Plan:  - Scheduled f/u call 2/29/21. Will check back with patient next week on insulin acquisition plan  Catie Darnelle Maffucci, PharmD, [place] [phone]

## 2019-07-28 NOTE — Chronic Care Management (AMB) (Signed)
Chronic Care Management   Follow Up Note   07/28/2019 Name: Scott Lawson. MRN: [id number] DOB: 1941/01/02  Referred by: Venita Lick, NP Reason for referral : Chronic Care Management (Medication Management)   [name] is a 79 y.o. year old male who is a primary care patient of Cannady, Barbaraann Faster, NP. The CCM team was consulted for assistance with chronic disease management and care coordination needs.   Contacted patient for medication management review.   Review of patient status, including review of consultants reports, relevant laboratory and other test results, and collaboration with appropriate care team members and the patient's provider was performed as part of comprehensive patient evaluation and provision of chronic care management services.    SDOH (Social Determinants of Health) screening performed today: Financial Strain . See Care Plan for related entries.   Outpatient Encounter Medications as of 07/28/2019  Medication Sig Note  . atorvastatin (LIPITOR) 40 MG tablet Take 1 tablet (40 mg total) by mouth daily.   . cyanocobalamin 1000 MCG tablet Take 1,000 mcg by mouth daily.   . insulin glargine (LANTUS) 100 UNIT/ML injection Inject 40 Units into the skin every evening. 1800   . lisinopril (ZESTRIL) 20 MG tablet Take 0.5 tablets (10 mg total) by mouth daily.   . metFORMIN (GLUCOPHAGE) 500 MG tablet TAKE 1 TABLET (500 MG TOTAL) BY MOUTH 2 (TWO) TIMES DAILY WITH A MEAL.   Marland Kitchen omeprazole (PRILOSEC) 20 MG capsule TAKE 1 CAPSULE (20 MG TOTAL) BY MOUTH DAILY.   Marland Kitchen sucralfate (CARAFATE) 1 g tablet Take 1 tablet (1 g total) by mouth 2 (two) times daily.   . tamsulosin (FLOMAX) 0.4 MG CAPS capsule TAKE 1 CAPSULE EVERY DAY   . TRUE METRIX BLOOD GLUCOSE TEST test strip CHECK BLOOD SUGAR TWICE DAILY   . TRUEplus Lancets 33G MISC CHECK BLOOD SUGAR TWICE DAILY   . aspirin 81 MG tablet Take 81 mg by mouth daily. 07/13/2019: holding  . fluticasone (FLONASE) 50 MCG/ACT  nasal spray Place 2 sprays into both nostrils daily as needed.    . insulin lispro (HUMALOG KWIKPEN) 100 UNIT/ML KwikPen Inject 0.1 mLs (10 Units total) into the skin 3 (three) times daily before meals. Continue to check blood sugar after meals. (Patient not taking: Reported on 07/28/2019)   . meloxicam (MOBIC) 15 MG tablet Take 15 mg by mouth daily. 07/13/2019: holding  . [DISCONTINUED] liraglutide (VICTOZA) 18 MG/3ML SOPN Start 0.6mg  SQ once a day for 14 days, then increase to 1.2mg  once a day    No facility-administered encounter medications on file as of 07/28/2019.     Objective:   Goals Addressed            This Visit's Progress     Patient Stated   . "I want to manage my diabetes as best I can" (pt-stated)       Current Barriers:  . Diabetes: uncontrolled; most recent A1c 13% o Sucralfate recently added for GI ulcer . Current antihyperglycemic regimen: metformin 500 mg BID, Lantus 40, Humalog prescribed, but too expensive for patient. 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 Most recent glucose results:  o Fasting: 112 this morning;  o After breakast: 252;  o Reports most fastings 150-200s, after meals 200-300s . Glucose readings:  o Breakfast: 2 hardboiled eggs, sausage, wheat toast, coffee  . Cardiovascular risk reduction: o Current hypertensive regimen: lisinopril  10 mg daily, BP well controlled on recent visits o Current hyperlipidemia regimen: atorvastatin 40 mg daily; LDL elevated on last check, but patient was not fasting  Pharmacist Clinical Goal(s):  Marland Kitchen Over the next 90 days, patient with work with PharmD and primary care provider to address optimized medication management  Interventions: . Comprehensive medication review performed, medication list updated in electronic medical record . Reviewed sucralfate administration - patient will separate from other medications by at least 2 hours to  avoid impacting absorption.  . Reviewed basal vs bolus insulin and physiologic insulin secretion patterns. Reviewed importance of obtaining bolus insulin. Patient is going to call the [place] on Monday to talk to someone about Humalog. I had him write down "Humalog" and "Novolog" as options, whichever is preferred by the [place]. I provided my direct number for him to provide to [facility] team for any questions about therapy plan. He is to call me back next week with updates. . Patient had questions about CKD and if any of his medications can cause kidney dysfunction; reviewed that hypertension and hyperglycemia are main reasons for kidney decline, and that we prevent decline by controlling these risk factors.   Patient Self Care Activities:  . Patient will check blood glucose BID, document, and provide at future appointments . Patient will take medications as prescribed . Patient will report any questions or concerns to provider   Please see past updates related to this goal by clicking on the "Past Updates" button in the selected goal           Plan:  - Scheduled f/u call 2/29/21. Will check back with patient next week on insulin acquisition plan  Catie Darnelle Maffucci, PharmD, [place] [phone]

## 2019-08-02 ENCOUNTER — Ambulatory Visit: Payer: Self-pay | Admitting: Pharmacist

## 2019-08-02 DIAGNOSIS — E1122 Type 2 diabetes mellitus with diabetic chronic kidney disease: Secondary | ICD-10-CM

## 2019-08-02 DIAGNOSIS — IMO0002 Reserved for concepts with insufficient information to code with codable children: Secondary | ICD-10-CM

## 2019-08-02 NOTE — Patient Instructions (Signed)
Visit Information  Goals Addressed            This Visit's Progress     Patient Stated   . "I want to manage my diabetes as best I can" (pt-stated)       Current Barriers:  . Diabetes: uncontrolled; most recent A1c 13% . Current antihyperglycemic regimen: metformin 500 mg BID, Lantus 40, Humalog prescribed, but too expensive for patient. 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 Cardiovascular risk reduction: o Current hypertensive regimen: lisinopril 10 mg daily, BP well controlled on recent visits o Current hyperlipidemia regimen: atorvastatin 40 mg daily; LDL elevated on last check, but patient was not fasting  Pharmacist Clinical Goal(s):  Marland Kitchen Over the next 90 days, patient with work with PharmD and primary care provider to address optimized medication management  Interventions: . Contacted patient. He notes that he called the [place] and left a message, and is awaiting a call back today or tomorrow. He plans to talk with them about needing bolus insulin (Novolog or Humalog) and request a referral to a GI provider. He notes he will call me back when he has more information . Reiterated upcoming appointment w/ Marnee Guarneri on 08/18/19  Patient Self Care Activities:  . Patient will check blood glucose BID, document, and provide at future appointments . Patient will take medications as prescribed . Patient will report any questions or concerns to provider   Please see past updates related to this goal by clicking on the "Past Updates" button in the selected goal          The patient verbalized understanding of instructions provided today and declined a print copy of patient instruction materials.   Plan:  - Will await call back from patient - Outreach call scheduled 09/13/19  Catie Darnelle Maffucci, PharmD, [place] [phone]

## 2019-08-02 NOTE — Chronic Care Management (AMB) (Signed)
Chronic Care Management   Follow Up Note   08/02/2019 Name: Wilsie Cirelli Riegler [name] MRN: [id number] DOB: January 16, 1941  Referred by: Venita Lick, NP Reason for referral : Chronic Care Management (Medication Management)   [name] is a 79 y.o. year old male who is a primary care patient of Cannady, Barbaraann Faster, NP. The CCM team was consulted for assistance with chronic disease management and care coordination needs.   Contacted patient to follow up on communication [facility] regarding [facility].   Review of patient status, including review of consultants reports, relevant laboratory and other test results, and collaboration with appropriate care team members and the patient's provider was performed as part of comprehensive patient evaluation and provision of chronic care management services.    SDOH (Social Determinants of Health) screening performed today: Financial Strain . See Care Plan for related entries.   Outpatient Encounter Medications as of 08/02/2019  Medication Sig Note  . aspirin 81 MG tablet Take 81 mg by mouth daily. 07/13/2019: holding  . atorvastatin (LIPITOR) 40 MG tablet Take 1 tablet (40 mg total) by mouth daily.   . cyanocobalamin 1000 MCG tablet Take 1,000 mcg by mouth daily.   . fluticasone (FLONASE) 50 MCG/ACT nasal spray Place 2 sprays into both nostrils daily as needed.    . insulin glargine (LANTUS) 100 UNIT/ML injection Inject 40 Units into the skin every evening. 1800   . insulin lispro (HUMALOG KWIKPEN) 100 UNIT/ML KwikPen Inject 0.1 mLs (10 Units total) into the skin 3 (three) times daily before meals. Continue to check blood sugar after meals. (Patient not taking: Reported on 07/28/2019)   . lisinopril (ZESTRIL) 20 MG tablet Take 0.5 tablets (10 mg total) by mouth daily.   . meloxicam (MOBIC) 15 MG tablet Take 15 mg by mouth daily. 07/13/2019: holding  . metFORMIN (GLUCOPHAGE) 500 MG tablet TAKE 1 TABLET (500 MG TOTAL) BY MOUTH 2 (TWO) TIMES  DAILY WITH A MEAL.   Marland Kitchen omeprazole (PRILOSEC) 20 MG capsule TAKE 1 CAPSULE (20 MG TOTAL) BY MOUTH DAILY.   Marland Kitchen sucralfate (CARAFATE) 1 g tablet Take 1 tablet (1 g total) by mouth 2 (two) times daily.   . tamsulosin (FLOMAX) 0.4 MG CAPS capsule TAKE 1 CAPSULE EVERY DAY   . TRUE METRIX BLOOD GLUCOSE TEST test strip CHECK BLOOD SUGAR TWICE DAILY   . TRUEplus Lancets 33G MISC CHECK BLOOD SUGAR TWICE DAILY    No facility-administered encounter medications on file as of 08/02/2019.     Objective:   Goals Addressed            This Visit's Progress     Patient Stated   . "I want to manage my diabetes as best I can" (pt-stated)       Current Barriers:  . Diabetes: uncontrolled; most recent A1c 13% . Current antihyperglycemic regimen: metformin 500 mg BID, Lantus 40, Humalog prescribed, but too expensive for patient. 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 Cardiovascular risk reduction: o Current hypertensive regimen: lisinopril 10 mg daily, BP well controlled on recent visits o Current hyperlipidemia regimen: atorvastatin 40 mg daily; LDL elevated on last check, but patient was not fasting  Pharmacist Clinical Goal(s):  Marland Kitchen Over the next 90 days, patient with work with PharmD and primary care provider to address optimized medication management  Interventions: . Contacted patient. He notes that he called the [place]  and left a message, and is awaiting a call back today or tomorrow. He plans to talk with them about needing bolus insulin (Novolog or Humalog) and request a referral to a GI provider. He notes he will call me back when he has more information . Reiterated upcoming appointment w/ Marnee Guarneri on 08/18/19  Patient Self Care Activities:  . Patient will check blood glucose BID, document, and provide at future appointments . Patient will take medications as prescribed . Patient will report any questions or concerns  to provider   Please see past updates related to this goal by clicking on the "Past Updates" button in the selected goal           Plan:  - Will await call back from patient - Outreach call scheduled 09/13/19  Catie Darnelle Maffucci, PharmD, [place] [phone]

## 2019-08-03 ENCOUNTER — Ambulatory Visit: Payer: Self-pay | Admitting: Pharmacist

## 2019-08-03 ENCOUNTER — Telehealth: Payer: Self-pay | Admitting: Pharmacist

## 2019-08-03 DIAGNOSIS — N1831 Chronic kidney disease, stage 3a: Secondary | ICD-10-CM | POA: Diagnosis not present

## 2019-08-03 DIAGNOSIS — E1122 Type 2 diabetes mellitus with diabetic chronic kidney disease: Secondary | ICD-10-CM | POA: Diagnosis not present

## 2019-08-03 DIAGNOSIS — IMO0002 Reserved for concepts with insufficient information to code with codable children: Secondary | ICD-10-CM

## 2019-08-03 DIAGNOSIS — E1165 Type 2 diabetes mellitus with hyperglycemia: Secondary | ICD-10-CM | POA: Diagnosis not present

## 2019-08-03 NOTE — Chronic Care Management (AMB) (Signed)
Chronic Care Management   Follow Up Note   08/03/2019 Name: Scott Lawson. MRN: [id number] DOB: [birth year]  Referred by: Venita Lick, NP Reason for referral : Chronic Care Management (Medication Management)   Scott Lawson. is a 79 y.o. year old male who is a primary care patient of Cannady, Barbaraann Faster, NP. The CCM team was consulted for assistance with chronic disease management and care coordination needs.    Care coordination completed today.   Review of patient status, including review of consultants reports, relevant laboratory and other test results, and collaboration with appropriate care team members and the patient's provider was performed as part of comprehensive patient evaluation and provision of chronic care management services.    SDOH (Social Determinants of Health) assessments performed: Yes SDOH Interventions     Most Recent Value  SDOH Interventions  Financial Strain Interventions  Other (Comment) [Collaboration w/ [facility] provider/pharmacy]       Outpatient Encounter Medications as of 08/03/2019  Medication Sig Note  . aspirin 81 MG tablet Take 81 mg by mouth daily. 07/13/2019: holding  . atorvastatin (LIPITOR) 40 MG tablet Take 1 tablet (40 mg total) by mouth daily.   . cyanocobalamin 1000 MCG tablet Take 1,000 mcg by mouth daily.   . fluticasone (FLONASE) 50 MCG/ACT nasal spray Place 2 sprays into both nostrils daily as needed.    . insulin glargine (LANTUS) 100 UNIT/ML injection Inject 40 Units into the skin every evening. 1800   . insulin lispro (HUMALOG KWIKPEN) 100 UNIT/ML KwikPen Inject 0.1 mLs (10 Units total) into the skin 3 (three) times daily before meals. Continue to check blood sugar after meals. (Patient not taking: Reported on 07/28/2019)   . lisinopril (ZESTRIL) 20 MG tablet Take 0.5 tablets (10 mg total) by mouth daily.   . meloxicam (MOBIC) 15 MG tablet Take 15 mg by mouth daily. 07/13/2019: holding  . metFORMIN (GLUCOPHAGE) 500 MG  tablet TAKE 1 TABLET (500 MG TOTAL) BY MOUTH 2 (TWO) TIMES DAILY WITH A MEAL.   Marland Kitchen omeprazole (PRILOSEC) 20 MG capsule TAKE 1 CAPSULE (20 MG TOTAL) BY MOUTH DAILY.   Marland Kitchen sucralfate (CARAFATE) 1 g tablet Take 1 tablet (1 g total) by mouth 2 (two) times daily.   . tamsulosin (FLOMAX) 0.4 MG CAPS capsule TAKE 1 CAPSULE EVERY DAY   . TRUE METRIX BLOOD GLUCOSE TEST test strip CHECK BLOOD SUGAR TWICE DAILY   . TRUEplus Lancets 33G MISC CHECK BLOOD SUGAR TWICE DAILY    No facility-administered encounter medications on file as of 08/03/2019.     Objective:   Goals Addressed            This Visit's Progress     Patient Stated   . "I want to manage my diabetes as best I can" (pt-stated)       CARE PLAN ENTRY (see longtitudinal plan of care for additional care plan information)  Current Barriers:  . Diabetes: uncontrolled; most recent A1c 13% . Current antihyperglycemic regimen: metformin 500 mg BID, Lantus 40, Humalog prescribed, but too expensive for patient. He needs to get from [place]. Received call from the [place].  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 Cardiovascular risk reduction: o Current hypertensive regimen: lisinopril 10 mg daily, BP well controlled on recent visits o Current hyperlipidemia regimen: atorvastatin 40 mg daily; LDL elevated on last check, but patient was not fasting  Pharmacist Clinical Goal(s):  Marland Kitchen Over  the next 90 days, patient with work with PharmD and primary care provider to address optimized medication management  Interventions: . Received call from [place] at [facility]. They need clinical visit note from [facility] visit on 07/21/19 as proof that Humalog was prescribed, and they they can dispense to patient. Will collaborate w/ office staff to have that completed.  . Contacted patient, explained the above. He verbalized understanding and appreciation.  Patient Self Care Activities:  . Patient will check  blood glucose BID, document, and provide at future appointments . Patient will take medications as prescribed . Patient will report any questions or concerns to provider   Please see past updates related to this goal by clicking on the "Past Updates" button in the selected goal           Plan:  - Will outreach as previously scheduled   Catie Darnelle Maffucci, PharmD, [place] [phone]

## 2019-08-03 NOTE — Patient Instructions (Signed)
Visit Information  Goals Addressed            This Visit's Progress     Patient Stated   . "I want to manage my diabetes as best I can" (pt-stated)       CARE PLAN ENTRY (see longtitudinal plan of care for additional care plan information)  Current Barriers:  . Diabetes: uncontrolled; most recent A1c 13% . Current antihyperglycemic regimen: metformin 500 mg BID, Lantus 40, Humalog prescribed, but too expensive for patient. He needs to get from [place]. Received call from the [place].  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 Cardiovascular risk reduction: o Current hypertensive regimen: lisinopril 10 mg daily, BP well controlled on recent visits o Current hyperlipidemia regimen: atorvastatin 40 mg daily; LDL elevated on last check, but patient was not fasting  Pharmacist Clinical Goal(s):  Marland Kitchen Over the next 90 days, patient with work with PharmD and primary care provider to address optimized medication management  Interventions: . Received call from [place] at [facility]. They need clinical visit note from [facility] visit on 07/21/19 as proof that Humalog was prescribed, and they they can dispense to patient. Will collaborate w/ office staff to have that completed.  . Contacted patient, explained the above. He verbalized understanding and appreciation.  Patient Self Care Activities:  . Patient will check blood glucose BID, document, and provide at future appointments . Patient will take medications as prescribed . Patient will report any questions or concerns to provider   Please see past updates related to this goal by clicking on the "Past Updates" button in the selected goal          Patient verbalizes understanding of instructions provided today.   Plan:  - Will outreach as previously scheduled   Catie Darnelle Maffucci, PharmD, [place] [phone]

## 2019-08-03 NOTE — Telephone Encounter (Signed)
Spoke with [facility]. They can prescribe bolus insulin, but need the visit note from when it was prescribed.   Can we please fax the visit note from 07/21/19 to [facility], attn [place], at [phone]. Patient has provided permission to release his records.

## 2019-08-04 ENCOUNTER — Other Ambulatory Visit: Payer: Self-pay | Admitting: Nurse Practitioner

## 2019-08-04 NOTE — Telephone Encounter (Signed)
faxed

## 2019-08-04 NOTE — Telephone Encounter (Signed)
Per encounter notes for this day it looks like Scott Lawson already sent the summary of care to [name]

## 2019-08-04 NOTE — Telephone Encounter (Signed)
I did request it be sent to Dr. Meyer Russel for GI visit, we may need to send again to discuss need for Humalog and put this on information with note Barnetta Chapel sent to [place].

## 2019-08-08 ENCOUNTER — Telehealth: Payer: Self-pay

## 2019-08-18 ENCOUNTER — Other Ambulatory Visit: Payer: Self-pay | Admitting: Unknown Physician Specialty

## 2019-08-18 ENCOUNTER — Other Ambulatory Visit: Payer: Self-pay

## 2019-08-18 ENCOUNTER — Ambulatory Visit (INDEPENDENT_AMBULATORY_CARE_PROVIDER_SITE_OTHER): Payer: Medicare HMO | Admitting: Nurse Practitioner

## 2019-08-18 ENCOUNTER — Encounter: Payer: Self-pay | Admitting: Nurse Practitioner

## 2019-08-18 VITALS — BP 122/78 | HR 70 | Temp 98.0°F

## 2019-08-18 DIAGNOSIS — R195 Other fecal abnormalities: Secondary | ICD-10-CM

## 2019-08-18 DIAGNOSIS — E1165 Type 2 diabetes mellitus with hyperglycemia: Secondary | ICD-10-CM

## 2019-08-18 DIAGNOSIS — E1122 Type 2 diabetes mellitus with diabetic chronic kidney disease: Secondary | ICD-10-CM | POA: Diagnosis not present

## 2019-08-18 DIAGNOSIS — IMO0002 Reserved for concepts with insufficient information to code with codable children: Secondary | ICD-10-CM

## 2019-08-18 NOTE — Patient Instructions (Signed)
Carbohydrate Counting for Diabetes Mellitus, Adult  Carbohydrate counting is a method of keeping track of how many carbohydrates you eat. Eating carbohydrates naturally increases the amount of sugar (glucose) in the blood. Counting how many carbohydrates you eat helps keep your blood glucose within normal limits, which helps you manage your diabetes (diabetes mellitus). It is important to know how many carbohydrates you can safely have in each meal. This is different for every person. A diet and nutrition specialist (registered dietitian) can help you make a meal plan and calculate how many carbohydrates you should have at each meal and snack. Carbohydrates are found in the following foods:  Grains, such as breads and cereals.  Dried beans and soy products.  Starchy vegetables, such as potatoes, peas, and corn.  Fruit and fruit juices.  Milk and yogurt.  Sweets and snack foods, such as cake, cookies, candy, chips, and soft drinks. How do I count carbohydrates? There are two ways to count carbohydrates in food. You can use either of the methods or a combination of both. Reading "Nutrition Facts" on packaged food The "Nutrition Facts" list is included on the labels of almost all packaged foods and beverages in the [place] It includes:  The serving size.  Information about nutrients in each serving, including the grams (g) of carbohydrate per serving. To use the "Nutrition Facts":  Decide how many servings you will have.  Multiply the number of servings by the number of carbohydrates per serving.  The resulting number is the total amount of carbohydrates that you will be having. Learning standard serving sizes of other foods When you eat carbohydrate foods that are not packaged or do not include "Nutrition Facts" on the label, you need to measure the servings in order to count the amount of carbohydrates:  Measure the foods that you will eat with a food scale or measuring cup, if  needed.  Decide how many standard-size servings you will eat.  Multiply the number of servings by 15. Most carbohydrate-rich foods have about 15 g of carbohydrates per serving. ? For example, if you eat 8 oz (170 g) of strawberries, you will have eaten 2 servings and 30 g of carbohydrates (2 servings x 15 g = 30 g).  For foods that have more than one food mixed, such as soups and casseroles, you must count the carbohydrates in each food that is included. The following list contains standard serving sizes of common carbohydrate-rich foods. Each of these servings has about 15 g of carbohydrates:   hamburger bun or  English muffin.   oz (15 mL) syrup.   oz (14 g) jelly.  1 slice of bread.  1 six-inch tortilla.  3 oz (85 g) cooked rice or pasta.  4 oz (113 g) cooked dried beans.  4 oz (113 g) starchy vegetable, such as peas, corn, or potatoes.  4 oz (113 g) hot cereal.  4 oz (113 g) mashed potatoes or  of a large baked potato.  4 oz (113 g) canned or frozen fruit.  4 oz (120 mL) fruit juice.  4-6 crackers.  6 chicken nuggets.  6 oz (170 g) unsweetened dry cereal.  6 oz (170 g) plain fat-free yogurt or yogurt sweetened with artificial sweeteners.  8 oz (240 mL) milk.  8 oz (170 g) fresh fruit or one small piece of fruit.  24 oz (680 g) popped popcorn. Example of carbohydrate counting Sample meal  3 oz (85 g) chicken breast.  6 oz (170 g)   brown rice.  4 oz (113 g) corn.  8 oz (240 mL) milk.  8 oz (170 g) strawberries with sugar-free whipped topping. Carbohydrate calculation 1. Identify the foods that contain carbohydrates: ? Rice. ? Corn. ? Milk. ? Strawberries. 2. Calculate how many servings you have of each food: ? 2 servings rice. ? 1 serving corn. ? 1 serving milk. ? 1 serving strawberries. 3. Multiply each number of servings by 15 g: ? 2 servings rice x 15 g = 30 g. ? 1 serving corn x 15 g = 15 g. ? 1 serving milk x 15 g = 15 g. ? 1  serving strawberries x 15 g = 15 g. 4. Add together all of the amounts to find the total grams of carbohydrates eaten: ? 30 g + 15 g + 15 g + 15 g = 75 g of carbohydrates total. Summary  Carbohydrate counting is a method of keeping track of how many carbohydrates you eat.  Eating carbohydrates naturally increases the amount of sugar (glucose) in the blood.  Counting how many carbohydrates you eat helps keep your blood glucose within normal limits, which helps you manage your diabetes.  A diet and nutrition specialist (registered dietitian) can help you make a meal plan and calculate how many carbohydrates you should have at each meal and snack. This information is not intended to replace advice given to you by your health care provider. Make sure you discuss any questions you have with your health care provider. Document Revised: 12/24/2016 Document Reviewed: 11/13/2015 Elsevier Patient Education  2020 [employer].  

## 2019-08-18 NOTE — Assessment & Plan Note (Signed)
No further dark stools, recommended he continue to keep visit with [place].

## 2019-08-18 NOTE — Progress Notes (Signed)
BP 122/78 (BP Location: Left Arm, Patient Position: Sitting)   Pulse 70   Temp 98 F (36.7 C) (Oral)   SpO2 98%    Subjective:    Patient ID: Scott Roof., male    DOB: 04-Jun-1941, 79 y.o.   MRN: [id number]  HPI: Scott Branco. is a 79 y.o. male  Chief Complaint  Patient presents with  . Diabetes   DIABETES Was to start on pre meal Humalog last visit, but is awaiting this from [place], should arrive today.  Has not started yet.  A1C in February 13%, prior 9.5%.  [facility] has instructed him to take 10 units before breakfast and 10 units before supper; to check blood sugars 2 hours after meal.  Has cut back on what he eats and lost some weight.    Had recent episodes of dark stool, this PCP contacted his PCP at [facility] and the GI providers at [facility] will be calling on Monday to schedule him for visit.  He reports no more dark stools and feeling better at this time, but is aware to keep appointment with GI at [facility]. Hypoglycemic episodes:no Polydipsia/polyuria: no Visual disturbance: no Chest pain: no Paresthesias: no Glucose Monitoring: yes  Accucheck frequency: BID  Fasting glucose: 100 this morning -- most mornings has been 120-130  Post prandial: 235 range after meals  Evening:  Before meals: Taking Insulin?: yes  Long acting insulin: Lantus 40 units   Short acting insulin: Blood Pressure Monitoring: daily -- 136/71 yesterday Retinal Examination: Not up to Date Foot Exam: Up to Date Pneumovax: Up to Date Influenza: Up to Date Aspirin: yes   Relevant past medical, surgical, family and social history reviewed and updated as indicated. Interim medical history since our last visit reviewed. Allergies and medications reviewed and updated.  Review of Systems  Constitutional: Negative for activity change, diaphoresis, fatigue and fever.  Respiratory: Negative for cough, chest tightness, shortness of breath and wheezing.   Cardiovascular: Negative for chest pain,  palpitations and leg swelling.  Gastrointestinal: Negative.   Endocrine: Negative for cold intolerance, heat intolerance, polydipsia, polyphagia and polyuria.  Neurological: Negative.   Psychiatric/Behavioral: Negative.     Per HPI unless specifically indicated above     Objective:    BP 122/78 (BP Location: Left Arm, Patient Position: Sitting)   Pulse 70   Temp 98 F (36.7 C) (Oral)   SpO2 98%   Wt Readings from Last 3 Encounters:  07/13/19 200 lb (90.7 kg)  04/20/19 222 lb (100.7 kg)  06/27/18 230 lb (104.3 kg)    Physical Exam Vitals and nursing note reviewed.  Constitutional:      General: He is not in acute distress.    Appearance: He is well-developed and overweight. He is not ill-appearing.  HENT:     Head: Normocephalic and atraumatic.     Right Ear: Hearing normal. No drainage.     Left Ear: Hearing normal. No drainage.     Mouth/Throat:     Pharynx: Uvula midline.  Eyes:     General: Lids are normal.        Right eye: No discharge.        Left eye: No discharge.     Conjunctiva/sclera: Conjunctivae normal.     Pupils: Pupils are equal, round, and reactive to light.  Neck:     Thyroid: No thyromegaly.     Vascular: No carotid bruit.  Cardiovascular:     Rate and  Rhythm: Normal rate and regular rhythm.     Heart sounds: Normal heart sounds, S1 normal and S2 normal. No murmur. No gallop.   Pulmonary:     Effort: Pulmonary effort is normal. No accessory muscle usage or respiratory distress.     Breath sounds: Normal breath sounds.  Abdominal:     General: Bowel sounds are normal.     Palpations: Abdomen is soft.  Musculoskeletal:        General: Normal range of motion.     Cervical back: Normal range of motion and neck supple.     Right lower leg: No edema.     Left lower leg: No edema.  Skin:    General: Skin is warm and dry.     Capillary Refill: Capillary refill takes less than 2 seconds.  Neurological:     Mental Status: He is alert and oriented  to person, place, and time.  Psychiatric:        Attention and Perception: Attention normal.        Mood and Affect: Mood normal.        Speech: Speech normal.        Behavior: Behavior normal.        Thought Content: Thought content normal.     Results for orders placed or performed in visit on 07/21/19  Comprehensive metabolic panel  Result Value Ref Range   Glucose 231 (H) 65 - 99 mg/dL   BUN 20 8 - 27 mg/dL   Creatinine, Ser 1.26 0.76 - 1.27 mg/dL   GFR calc non Af Amer 54 (L) >59 mL/min/1.73   GFR calc Af Amer 63 >59 mL/min/1.73   BUN/Creatinine Ratio 16 10 - 24   Sodium 139 134 - 144 mmol/L   Potassium 4.9 3.5 - 5.2 mmol/L   Chloride 99 96 - 106 mmol/L   CO2 24 20 - 29 mmol/L   Calcium 9.6 8.6 - 10.2 mg/dL   Total Protein 6.8 6.0 - 8.5 g/dL   Albumin 4.3 3.7 - 4.7 g/dL   Globulin, Total 2.5 1.5 - 4.5 g/dL   Albumin/Globulin Ratio 1.7 1.2 - 2.2   Bilirubin Total 0.9 0.0 - 1.2 mg/dL   Alkaline Phosphatase 79 39 - 117 IU/L   AST 16 0 - 40 IU/L   ALT 14 0 - 44 IU/L  Lipid Panel w/o Chol/HDL Ratio  Result Value Ref Range   Cholesterol, Total 200 (H) 100 - 199 mg/dL   Triglycerides 275 (H) 0 - 149 mg/dL   HDL 42 >39 mg/dL   VLDL Cholesterol Cal 47 (H) 5 - 40 mg/dL   LDL Chol Calc (NIH) 111 (H) 0 - 99 mg/dL  CBC with Differential/Platelet  Result Value Ref Range   WBC 7.2 3.4 - 10.8 x10E3/uL   RBC 4.75 4.14 - 5.80 x10E6/uL   Hemoglobin 14.5 13.0 - 17.7 g/dL   Hematocrit 42.9 37.5 - 51.0 %   MCV 90 79 - 97 fL   MCH 30.5 26.6 - 33.0 pg   MCHC 33.8 31.5 - 35.7 g/dL   RDW 12.4 11.6 - 15.4 %   Platelets 213 150 - 450 x10E3/uL   Neutrophils 63 Not Estab. %   Lymphs 26 Not Estab. %   Monocytes 8 Not Estab. %   Eos 2 Not Estab. %   Basos 1 Not Estab. %   Neutrophils Absolute 4.5 1.4 - 7.0 x10E3/uL   Lymphocytes Absolute 1.9 0.7 - 3.1 x10E3/uL   Monocytes Absolute 0.6  0.1 - 0.9 x10E3/uL   EOS (ABSOLUTE) 0.1 0.0 - 0.4 x10E3/uL   Basophils Absolute 0.1 0.0 - 0.2  x10E3/uL   Immature Granulocytes 0 Not Estab. 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  A1C recently 13% with urine ALB 30 and A:C <30.  Will continue Lantus 40 units daily and Metformin 500 MG BID( can not tolerate more than this).  Script for Humalog Flexpen to start 10 units with breakfast and dinner, check BS consistently with each meal, instructed him NOT to take if does not eat a meal.  Humalog will arrive today or on weekend, delivered from [facility]. Return in 4 weeks.        Other   Dark stools    No further dark stools, recommended he continue to keep visit with [place].            Follow up plan: Return in about 4 weeks (around 09/15/2019) for T2DM -- med changes with addition of Humalog.

## 2019-08-18 NOTE — Assessment & Plan Note (Addendum)
Chronic, ongoing.  A1C recently 13% with urine ALB 30 and A:C <30.  Will continue Lantus 40 units daily and Metformin 500 MG BID( can not tolerate more than this).  Script for Humalog Flexpen to start 10 units with breakfast and dinner, check BS consistently with each meal, instructed him NOT to take if does not eat a meal.  Humalog will arrive today or on weekend, delivered from [facility]. Return in 4 weeks.

## 2019-08-28 ENCOUNTER — Other Ambulatory Visit: Payer: Self-pay | Admitting: Nurse Practitioner

## 2019-08-28 NOTE — Telephone Encounter (Signed)
Requested medication (s) are due for refill today: yes  Requested medication (s) are on the active medication list: yes  Last refill:  07/21/19  Future visit scheduled: yes  Notes to clinic:  prescription expired 08/20/19   Requested Prescriptions  Pending Prescriptions Disp Refills   sucralfate (CARAFATE) 1 g tablet [Pharmacy Med Name: SUCRALFATE 1GM TABLETS] 180 tablet     Sig: TAKE 1 TABLET(1 GRAM) BY MOUTH TWICE DAILY      Gastroenterology: Antiacids Passed - 08/28/2019  3:25 PM      Passed - Valid encounter within last 12 months    Recent Outpatient Visits           1 week ago Uncontrolled type 2 diabetes mellitus with chronic kidney disease ([place])   [place], Jolene T, NP   1 month ago Uncontrolled type 2 diabetes mellitus with chronic kidney disease ([place])   [place], Morgan T, NP   1 month ago Dark stools   [employer], [place] T, NP   4 months ago Uncontrolled type 2 diabetes mellitus with chronic kidney disease ([place])   [place] Kathrine Haddock, NP   8 months ago Uncontrolled type 2 diabetes mellitus with chronic kidney disease ([place])   [place], Barbaraann Faster, NP       Future Appointments             In 2 weeks Cannady, Barbaraann Faster, NP [employer]   In 2 months  [employer]

## 2019-09-07 ENCOUNTER — Telehealth: Payer: Self-pay

## 2019-09-07 NOTE — Telephone Encounter (Signed)
[facility] prescribed new insulin, to take before eating. He likes the results so far, will call 09/08/2019 to provide all results. He feels a lot better over all. Pt wants [place] to communicate with [facility] results to allow Insulin to be sent to him regularly.   Copied from [place] [phone]. Topic: General - Inquiry >> Sep 07, 2019 10:55 AM Alease Frame wrote: Reason for CRM: Patient wants Cannady's nurse to give him a call on [phone] to discuss his symptoms from current medication.Please advise

## 2019-09-12 ENCOUNTER — Ambulatory Visit (INDEPENDENT_AMBULATORY_CARE_PROVIDER_SITE_OTHER): Payer: Medicare HMO | Admitting: Pharmacist

## 2019-09-12 DIAGNOSIS — E1122 Type 2 diabetes mellitus with diabetic chronic kidney disease: Secondary | ICD-10-CM | POA: Diagnosis not present

## 2019-09-12 DIAGNOSIS — E1165 Type 2 diabetes mellitus with hyperglycemia: Secondary | ICD-10-CM | POA: Diagnosis not present

## 2019-09-12 DIAGNOSIS — IMO0002 Reserved for concepts with insufficient information to code with codable children: Secondary | ICD-10-CM

## 2019-09-12 NOTE — Chronic Care Management (AMB) (Signed)
Chronic Care Management   Follow Up Note   09/12/2019 Name: Nay Killinger Hindes [name] MRN: [id number] DOB: [birth year]  Referred by: Venita Lick, NP Reason for referral : Chronic Care Management (Medication Management)   [name] is a 79 y.o. year old male who is a primary care patient of Cannady, Barbaraann Faster, NP. The CCM team was consulted for assistance with chronic disease management and care coordination needs.    Contacted patient for medication management review.   Review of patient status, including review of consultants reports, relevant laboratory and other test results, and collaboration with appropriate care team members and the patient's provider was performed as part of comprehensive patient evaluation and provision of chronic care management services.    SDOH (Social Determinants of Health) assessments performed: Yes See Care Plan activities for detailed interventions related to [facility])     Outpatient Encounter Medications as of 09/12/2019  Medication Sig Note  . insulin glargine (LANTUS) 100 UNIT/ML injection Inject 40 Units into the skin every evening. 1800   . insulin lispro (HUMALOG KWIKPEN) 100 UNIT/ML KwikPen Inject 0.1 mLs (10 Units total) into the skin 3 (three) times daily before meals. Continue to check blood sugar after meals.   . sucralfate (CARAFATE) 1 g tablet TAKE 1 TABLET(1 GRAM) BY MOUTH TWICE DAILY   . aspirin 81 MG tablet Take 81 mg by mouth daily. 07/13/2019: holding  . atorvastatin (LIPITOR) 40 MG tablet Take 1 tablet (40 mg total) by mouth daily.   . cyanocobalamin 1000 MCG tablet Take 1,000 mcg by mouth daily.   . fluticasone (FLONASE) 50 MCG/ACT nasal spray Place 2 sprays into both nostrils daily as needed.    [name] Take 0.5 tablets (10 mg total) by mouth daily.   . meloxicam (MOBIC) 15 MG tablet Take 15 mg by mouth daily. 07/13/2019: holding  . metFORMIN (GLUCOPHAGE) 500 MG tablet TAKE 1 TABLET TWICE DAILY WITH  FOOD   . omeprazole (PRILOSEC) 20 MG capsule TAKE 1 CAPSULE (20 MG TOTAL) BY MOUTH DAILY.   . tamsulosin (FLOMAX) 0.4 MG CAPS capsule TAKE 1 CAPSULE EVERY DAY   . TRUE METRIX BLOOD GLUCOSE TEST test strip CHECK BLOOD SUGAR TWICE DAILY   . TRUEplus Lancets 33G MISC CHECK BLOOD SUGAR TWICE DAILY    No facility-administered encounter medications on file as of 09/12/2019.     Objective:   Goals Addressed            This Visit's Progress     Patient Stated   . "I want to manage my diabetes as best I can" (pt-stated)       CARE PLAN ENTRY (see longtitudinal plan of care for additional care plan information)  Current Barriers:  . Diabetes: uncontrolled; most recent A1c 13% o GI upset/pain continues, [facility] app[facility] of [facility]. Reports the sucralfate has helped. Confirms that he is separating this medication from other medications by at least 2 hours o Reports that since his sugars have gotten better controlled, he feels "better than I have in years" . Current antihyperglycemic regimen: metformin 500 mg BID, Lantus 40 units, Humalog 10 units with breakfast and supper o Max tolerated dose of metformin  o Hx GI upset with Ozempic therapy; VA would not cover Victoza o Intolerability to SGLT2 . Denies episodes of hypoglycemia, denies readings <80, except one time his bedtime reading was 115, he took Lantus 40 units, and fasting the next morning was 68.Marland Kitchen Marland Kitchen  Current glucose readings:  o Fasting: 90-120s o Post lunch: reports most readings are >200 o Post supper: 120-150s . Current meal patterns: o Breakfast:  o Lunch: sometimes eats soup, sometimes egg salad sandwich; toast o Supper:  . Cardiovascular risk reduction: o Current hypertensive regimen: lisinopril 10 mg daily, BP well controlled on recent visits o Current hyperlipidemia regimen: atorvastatin 40 mg daily; LDL elevated on last check, but patient was not fasting  Pharmacist Clinical Goal(s):  Marland Kitchen Over the next 90 days, patient with  work with PharmD and primary care provider to address optimized medication management  Interventions: . Praised patient for control of his blood sugars with initiation of mealtime insulin. . Discussed goal A1c, goal fasting, and goal 2 hour post prandial blood sugars.  . Discussed episode of fasting hypoglycemia and low fasting readings. Recommend reducing Lantus to ~30 units daily at PCP f/u on Friday. . Discussed elevated post-lunch readings. He notes that his lunch and supper meals are generally the same size/carb content. Likely appropriate to add Humalog 10 units with lunch as well. Patient will check post lunch readings for the next two days to bring to PCP f/u on Friday.  . Reviewed counseling points for sucralfate. Patient will discuss his continued concerns w/ PCP on Friday, and about what should be done until his GI appt at the [facility] in late May  Patient Self Care Activities:  . Patient will check blood glucose BID, document, and provide at future appointments . Patient will take medications as prescribed . Patient will report any questions or concerns to provider   Please see past updates related to this goal by clicking on the "Past Updates" button in the selected goal           Plan:  - Scheduled f/u call 10/27/19  Catie Darnelle Maffucci, PharmD, [place] [phone]

## 2019-09-12 NOTE — Patient Instructions (Signed)
Visit Information  Goals Addressed            This Visit's Progress     Patient Stated   . "I want to manage my diabetes as best I can" (pt-stated)       CARE PLAN ENTRY (see longtitudinal plan of care for additional care plan information)  Current Barriers:  . Diabetes: uncontrolled; most recent A1c 13% o GI upset/pain continues, [facility] app[facility] at end of May. Reports the sucralfate has helped. Confirms that he is separating this medication from other medications by at least 2 hours o Reports that since his sugars have gotten better controlled, he feels "better than I have in years" . Current antihyperglycemic regimen: metformin 500 mg BID, Lantus 40 units, Humalog 10 units with breakfast and supper o Max tolerated dose of metformin  o Hx GI upset with Ozempic therapy; [facility] would not cover Victoza o Intolerability to SGLT2 . Denies episodes of hypoglycemia, denies readings <80, except one time his bedtime reading was 115, he took Lantus 40 units, and fasting the next morning was 68.. . Current glucose readings:  o Fasting: 90-120s o Post lunch: reports most readings are >200 o Post supper: 120-150s . Current meal patterns: o Breakfast:  o Lunch: sometimes eats soup, sometimes egg salad sandwich; toast o Supper:  . Cardiovascular risk reduction: o Current hypertensive regimen: lisinopril 10 mg daily, BP well controlled on recent visits o Current hyperlipidemia regimen: atorvastatin 40 mg daily; LDL elevated on last check, but patient was not fasting  Pharmacist Clinical Goal(s):  Marland Kitchen Over the next 90 days, patient with work with PharmD and primary care provider to address optimized medication management  Interventions: . Praised patient for control of his blood sugars with initiation of mealtime insulin. . Discussed goal A1c, goal fasting, and goal 2 hour post prandial blood sugars.  . Discussed episode of fasting hypoglycemia and low fasting readings. Recommend reducing Lantus to ~30  units daily at PCP f/u on Friday. . Discussed elevated post-lunch readings. He notes that his lunch and supper meals are generally the same size/carb content. Likely appropriate to add Humalog 10 units with lunch as well. Patient will check post lunch readings for the next two days to bring to PCP f/u on Friday.  . Reviewed counseling points for sucralfate. Patient will discuss his continued concerns w/ PCP on Friday, and about what should be done until his GI appt at the [facility] in late May  Patient Self Care Activities:  . Patient will check blood glucose BID, document, and provide at future appointments . Patient will take medications as prescribed . Patient will report any questions or concerns to provider   Please see past updates related to this goal by clicking on the "Past Updates" button in the selected goal          Patient verbalizes understanding of instructions provided today.   Plan:  - Scheduled f/u call 10/27/19  Catie Darnelle Maffucci, PharmD, [place] [phone]

## 2019-09-13 ENCOUNTER — Telehealth: Payer: Self-pay

## 2019-09-15 ENCOUNTER — Other Ambulatory Visit: Payer: Self-pay

## 2019-09-15 ENCOUNTER — Encounter: Payer: Self-pay | Admitting: Nurse Practitioner

## 2019-09-15 ENCOUNTER — Ambulatory Visit (INDEPENDENT_AMBULATORY_CARE_PROVIDER_SITE_OTHER): Payer: Medicare HMO | Admitting: Nurse Practitioner

## 2019-09-15 VITALS — BP 108/71 | HR 70 | Temp 97.9°F | Wt 220.0 lb

## 2019-09-15 DIAGNOSIS — E1165 Type 2 diabetes mellitus with hyperglycemia: Secondary | ICD-10-CM

## 2019-09-15 DIAGNOSIS — E1122 Type 2 diabetes mellitus with diabetic chronic kidney disease: Secondary | ICD-10-CM | POA: Diagnosis not present

## 2019-09-15 DIAGNOSIS — IMO0002 Reserved for concepts with insufficient information to code with codable children: Secondary | ICD-10-CM

## 2019-09-15 MED ORDER — SUCRALFATE 1 G PO TABS: ORAL_TABLET | ORAL | 0 refills | Status: DC

## 2019-09-15 NOTE — Progress Notes (Signed)
BP 108/71   Pulse 70   Temp 97.9 F (36.6 C) (Oral)   Wt 220 lb (99.8 kg)   SpO2 98%   BMI 34.46 kg/m    Subjective:    Patient ID: Scott Roof., male    DOB: June 30, 1940, 79 y.o.   MRN: [id number]  HPI: Scott Lawson. is a 79 y.o. male  Chief Complaint  Patient presents with  . Diabetes    DIABETES Started on Humalog now, has received from [place], has been on 18 days and reports noticing a major difference in sugars and feeling a lot better.   A1C in February 13%, prior 9.5%.  [facility] instructed him to take 10 units before breakfast and 10 units before supper; to check blood sugars 2 hours after meal. Hypoglycemic episodes:none Polydipsia/polyuria: no Visual disturbance: no Chest pain: no Paresthesias: no Glucose Monitoring: yes             Accucheck frequency: BID             Fasting glucose: 80 to130             Post prandial:              Evening: 2 hours after meal 110 to 140             Before meals: Taking Insulin?: yes             Long acting insulin: Lantus 40 units              Short acting insulin: Humalog 10 units before breakfast and supper Blood Pressure Monitoring: daily  Retinal Examination: Not up to Date Foot Exam: Up to Date Pneumovax: Up to Date Influenza: Up to Date Aspirin: yes   Relevant past medical, surgical, family and social history reviewed and updated as indicated. Interim medical history since our last visit reviewed. Allergies and medications reviewed and updated.  Review of Systems  Constitutional: Negative for activity change, diaphoresis, fatigue and fever.  Respiratory: Negative for cough, chest tightness, shortness of breath and wheezing.   Cardiovascular: Negative for chest pain, palpitations and leg swelling.  Gastrointestinal: Negative.   Endocrine: Negative for cold intolerance, heat intolerance, polydipsia, polyphagia and polyuria.  Neurological: Negative.   Psychiatric/Behavioral: Negative.      Per HPI unless specifically indicated above     Objective:    BP 108/71   Pulse 70   Temp 97.9 F (36.6 C) (Oral)   Wt 220 lb (99.8 kg)   SpO2 98%   BMI 34.46 kg/m   Wt Readings from Last 3 Encounters:  09/15/19 220 lb (99.8 kg)  07/13/19 200 lb (90.7 kg)  04/20/19 222 lb (100.7 kg)    Physical Exam Vitals and nursing note reviewed.  Constitutional:      General: He is not in acute distress.    Appearance: He is well-developed and overweight. He is not ill-appearing.  HENT:     Head: Normocephalic and atraumatic.     Right Ear: Hearing normal. No drainage.     Left Ear: Hearing normal. No drainage.     Mouth/Throat:     Pharynx: Uvula midline.  Eyes:     General: Lids are normal.        Right eye: No discharge.        Left eye: No discharge.     Conjunctiva/sclera: Conjunctivae normal.     Pupils: Pupils are equal, round, and reactive to  light.  Neck:     Thyroid: No thyromegaly.     Vascular: No carotid bruit.  Cardiovascular:     Rate and Rhythm: Normal rate and regular rhythm.     Heart sounds: Normal heart sounds, S1 normal and S2 normal. No murmur. No gallop.   Pulmonary:     Effort: Pulmonary effort is normal. No accessory muscle usage or respiratory distress.     Breath sounds: Normal breath sounds.  Abdominal:     General: Bowel sounds are normal.     Palpations: Abdomen is soft.  Musculoskeletal:        General: Normal range of motion.     Cervical back: Normal range of motion and neck supple.     Right lower leg: No edema.     Left lower leg: No edema.  Skin:    General: Skin is warm and dry.     Capillary Refill: Capillary refill takes less than 2 seconds.  Neurological:     Mental Status: He is alert and oriented to person, place, and time.  Psychiatric:        Attention and Perception: Attention normal.        Mood and Affect: Mood normal.        Speech: Speech normal.        Behavior: Behavior normal.        Thought Content: Thought content  normal.     Results for orders placed or performed in visit on 07/21/19  Comprehensive metabolic panel  Result Value Ref Range   Glucose 231 (H) 65 - 99 mg/dL   BUN 20 8 - 27 mg/dL   Creatinine, Ser 1.26 0.76 - 1.27 mg/dL   GFR calc non Af Amer 54 (L) >59 mL/min/1.73   GFR calc Af Amer 63 >59 mL/min/1.73   BUN/Creatinine Ratio 16 10 - 24   Sodium 139 134 - 144 mmol/L   Potassium 4.9 3.5 - 5.2 mmol/L   Chloride 99 96 - 106 mmol/L   CO2 24 20 - 29 mmol/L   Calcium 9.6 8.6 - 10.2 mg/dL   Total Protein 6.8 6.0 - 8.5 g/dL   Albumin 4.3 3.7 - 4.7 g/dL   Globulin, Total 2.5 1.5 - 4.5 g/dL   Albumin/Globulin Ratio 1.7 1.2 - 2.2   Bilirubin Total 0.9 0.0 - 1.2 mg/dL   Alkaline Phosphatase 79 39 - 117 IU/L   AST 16 0 - 40 IU/L   ALT 14 0 - 44 IU/L  Lipid Panel w/o Chol/HDL Ratio  Result Value Ref Range   Cholesterol, Total 200 (H) 100 - 199 mg/dL   Triglycerides 275 (H) 0 - 149 mg/dL   HDL 42 >39 mg/dL   VLDL Cholesterol Cal 47 (H) 5 - 40 mg/dL   LDL Chol Calc (NIH) 111 (H) 0 - 99 mg/dL  CBC with Differential/Platelet  Result Value Ref Range   WBC 7.2 3.4 - 10.8 x10E3/uL   RBC 4.75 4.14 - 5.80 x10E6/uL   Hemoglobin 14.5 13.0 - 17.7 g/dL   Hematocrit 42.9 37.5 - 51.0 %   MCV 90 79 - 97 fL   MCH 30.5 26.6 - 33.0 pg   MCHC 33.8 31.5 - 35.7 g/dL   RDW 12.4 11.6 - 15.4 %   Platelets 213 150 - 450 x10E3/uL   Neutrophils 63 Not Estab. %   Lymphs 26 Not Estab. %   Monocytes 8 Not Estab. %   Eos 2 Not Estab. %   Basos 1  Not Estab. %   Neutrophils Absolute 4.5 1.4 - 7.0 x10E3/uL   Lymphocytes Absolute 1.9 0.7 - 3.1 x10E3/uL   Monocytes Absolute 0.6 0.1 - 0.9 x10E3/uL   EOS (ABSOLUTE) 0.1 0.0 - 0.4 x10E3/uL   Basophils Absolute 0.1 0.0 - 0.2 x10E3/uL   Immature Granulocytes 0 Not Estab. 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  A1C in February 13%, Humalog added and this was started 18 days ago after he received from [place].  Sugars are trending downwards after meals.  Will continue Lantus 40 units daily and Metformin 500 MG BID( can not tolerate more than this).  Continue Humalog Flexpen 10 units with breakfast and dinner, check BS consistently with each meal, instructed him NOT to take if does not eat a meal.  Avoid hypoglycemia, educated him on this and to report to provider if consistent BS <70, then would stop Humalog.  Return in 5 weeks for A1C and chronic disease visit.          Follow up plan: Return in about 5 weeks (around 10/20/2019) for T2DM, HTN/HLD, CKD.

## 2019-09-15 NOTE — Patient Instructions (Signed)
Insulin Treatment for Diabetes Mellitus Diabetes (diabetes mellitus) is a long-term (chronic) disease. It occurs when the body does not properly use sugar (glucose) that is released from food after digestion. Glucose levels are controlled by a hormone called insulin. Insulin is made in the pancreas, which is an organ behind the stomach.  If you have type 1 diabetes, you must take insulin because your pancreas does not make any.  If you have type 2 diabetes, you might need to take insulin along with other medicines. In type 2 diabetes, one or both of these problems may be present: ? The pancreas does not make enough insulin. ? Cells in the body do not respond properly to insulin that the body makes (insulin resistance). You must use insulin correctly to control your diabetes. You must have some insulin in your body at all times. Insulin treatment varies depending on your type of diabetes, your treatment goals, and your medical history. Ask questions to understand your insulin treatment plan so you can be an active partner in managing your diabetes. How is insulin given? Insulin can only be given through a shot (injection). It is injected using a syringe and needle, an insulin pen, a pump, or a jet injector. Your health care provider will:  Prescribe the type and amount of insulin that you need.  Tell you when you should inject your insulin. Where on the body should insulin be injected? Insulin is injected into a layer of fatty tissue under the skin. Good places to inject insulin include:  Abdomen. Generally, the abdomen is the best place to inject insulin. However, you should avoid any area that is less than 2 inches (5 cm) from the belly button (navel).  Front of thigh.  Upper, outer side of thigh.  Upper, outer side of arm.  Upper, outer part of buttock. It is important to:  Give your injection in a slightly different place each time. This helps to prevent irritation and improve  absorption.  Avoid injecting into areas that have scar tissue. Usually, you will give yourself insulin injections. Others can also be taught how to give you injections. You will use a special type of syringe that is made only for insulin. Some people may have an insulin pump that delivers insulin steadily through a tube (cannula) that is placed under the skin. What are the different types of insulin? The following information is a general guide to different types of insulin. Specifics vary depending on the insulin product that your health care provider prescribes.  Rapid-acting insulin: ? Starts working quickly, in as little as 5 minutes. ? Can last for 4-6 hours (or sometimes longer). ? Works well when taken right before a meal to quickly lower your blood glucose.  Short-acting insulin: ? Starts working in about 30 minutes. ? Can last for 6-10 hours. ? Should be taken about 30 minutes before you start eating a meal.  Intermediate-acting insulin: ? Starts working in 1-2 hours. ? Lasts for about 10-18 hours. ? Lowers your blood glucose for a longer period of time, but it is not as effective for lowering blood glucose right after a meal.  Long-acting insulin: ? Mimics the small amount of insulin that your pancreas usually produces throughout the day. ? Should be used one or two times a day. ? Is usually used in combination with other types of insulin or other medicines.  Concentrated insulin, or U-500 insulin: ? Contains a higher dose of insulin than most rapid-acting insulins. U-500 insulin has  5 times the amount of insulin per 1 mL. ? Should only be used with the special U-500 syringe or U-500 insulin pen. It is dangerous to use the wrong type of syringe with this insulin. What are the side effects of insulin? Possible side effects of insulin treatment include:  Low blood glucose (hypoglycemia).  Weight gain.  High blood glucose (hyperglycemia).  Skin injury or  irritation. Some of these side effects can be caused by using improper injection technique. Be sure to learn how to inject insulin properly. What are common terms associated with insulin treatment? Some terms that you might hear include:  Basal insulin, or basal rate. This is the constant amount of insulin that needs to be present in your body to keep your blood glucose levels stable. People who have type 1 diabetes need basal insulin in a steady (continuous) dose 24 hours a day. ? Usually, intermediate-acting or long-acting insulin is used one or two times a day to manage basal insulin levels. ? Medicines that are taken by mouth may also be recommended to manage basal insulin levels.  Prandial or nutrition insulin. This refers to meal-related insulin. ? Blood glucose rises quickly after a meal (postprandial). Rapid-acting or short-acting insulin can be used right before a meal (preprandial) to quickly lower your blood glucose. ? You may be instructed to adjust the amount of prandial insulin that you take, based on how much carbohydrate (starch) is in your meal.  Corrective insulin. This may also be called a correction dose or supplemental dose. This is a small amount of rapid-acting or short-acting insulin that can be used to lower your blood glucose if it is too high. You may be instructed to check your blood glucose at certain times of the day and use corrective insulin as needed.  Tight control, or intensive therapy. This means keeping your blood glucose as close to your target as possible, and preventing your blood glucose from getting too high after meals. People who have tight control of their diabetes have fewer long-term problems caused by diabetes. Follow these instructions at home: Talk with your health care provider or pharmacist about the type of insulin you should take and when you should take it. You should know when your insulin goes up the most (peaks) and when it wears off. You  need this information so you can plan your meals and exercise. Work with your health care provider to:  Check your blood glucose every day. Your health care provider will tell you how often and when you should do this.  Manage your: ? Weight. ? Blood pressure. ? Cholesterol. ? Stress.  Eat a healthy diet.  Exercise regularly. Summary  Diabetes is a long-term (chronic) disease. It occurs when the body does not properly use sugar (glucose) that is released from food after digestion. Glucose levels are controlled by a hormone called insulin, which is made in an organ behind your stomach (pancreas).  You must use insulin correctly to control your diabetes. You must have some insulin in your body at all times.  Insulin treatment varies depending on your type of diabetes, your treatment goals, and your medical history.  Talk with your health care provider or pharmacist about the type of insulin you should take and when you should take it.  Check your blood glucose every day. Your health care provider will tell you how often and when you should check it. This information is not intended to replace advice given to you by your health  care provider. Make sure you discuss any questions you have with your health care provider. Document Revised: 06/04/2017 Document Reviewed: 07/05/2015 Elsevier Patient Education  2020 [employer].

## 2019-09-15 NOTE — Assessment & Plan Note (Addendum)
Chronic, ongoing.  A1C in February 13%, Humalog added and this was started 18 days ago after he received from [place].  Sugars are trending downwards after meals.  Will continue Lantus 40 units daily and Metformin 500 MG BID( can not tolerate more than this).  Continue Humalog Flexpen 10 units with breakfast and dinner, check BS consistently with each meal, instructed him NOT to take if does not eat a meal.  Avoid hypoglycemia, educated him on this and to report to provider if consistent BS <70, then would stop Humalog.  Return in 5 weeks for A1C and chronic disease visit.

## 2019-10-04 ENCOUNTER — Other Ambulatory Visit: Payer: Self-pay | Admitting: Nurse Practitioner

## 2019-10-12 ENCOUNTER — Encounter: Payer: Self-pay | Admitting: Nurse Practitioner

## 2019-10-12 DIAGNOSIS — Z794 Long term (current) use of insulin: Secondary | ICD-10-CM | POA: Insufficient documentation

## 2019-10-12 DIAGNOSIS — I700 Atherosclerosis of aorta: Secondary | ICD-10-CM | POA: Insufficient documentation

## 2019-10-20 ENCOUNTER — Other Ambulatory Visit: Payer: Self-pay

## 2019-10-20 ENCOUNTER — Ambulatory Visit (INDEPENDENT_AMBULATORY_CARE_PROVIDER_SITE_OTHER): Payer: Medicare HMO | Admitting: Nurse Practitioner

## 2019-10-20 ENCOUNTER — Encounter: Payer: Self-pay | Admitting: Nurse Practitioner

## 2019-10-20 VITALS — BP 123/72 | HR 59 | Temp 97.8°F | Wt 222.0 lb

## 2019-10-20 DIAGNOSIS — Z794 Long term (current) use of insulin: Secondary | ICD-10-CM | POA: Diagnosis not present

## 2019-10-20 DIAGNOSIS — IMO0002 Reserved for concepts with insufficient information to code with codable children: Secondary | ICD-10-CM

## 2019-10-20 DIAGNOSIS — E1165 Type 2 diabetes mellitus with hyperglycemia: Secondary | ICD-10-CM | POA: Diagnosis not present

## 2019-10-20 DIAGNOSIS — E1122 Type 2 diabetes mellitus with diabetic chronic kidney disease: Secondary | ICD-10-CM | POA: Diagnosis not present

## 2019-10-20 DIAGNOSIS — C61 Malignant neoplasm of prostate: Secondary | ICD-10-CM | POA: Diagnosis not present

## 2019-10-20 DIAGNOSIS — I700 Atherosclerosis of aorta: Secondary | ICD-10-CM

## 2019-10-20 DIAGNOSIS — E785 Hyperlipidemia, unspecified: Secondary | ICD-10-CM

## 2019-10-20 DIAGNOSIS — N1831 Chronic kidney disease, stage 3a: Secondary | ICD-10-CM

## 2019-10-20 DIAGNOSIS — E1169 Type 2 diabetes mellitus with other specified complication: Secondary | ICD-10-CM

## 2019-10-20 DIAGNOSIS — I1310 Hypertensive heart and chronic kidney disease without heart failure, with stage 1 through stage 4 chronic kidney disease, or unspecified chronic kidney disease: Secondary | ICD-10-CM | POA: Diagnosis not present

## 2019-10-20 LAB — BAYER DCA HB A1C WAIVED: HB A1C (BAYER DCA - WAIVED): 10 % — ABNORMAL HIGH (ref <7.0)

## 2019-10-20 NOTE — Assessment & Plan Note (Signed)
Ongoing, noted on lung CT in 2019.  Recommend continued use of statin and ASA daily + avoid use of nicotine products. °

## 2019-10-20 NOTE — Assessment & Plan Note (Signed)
Followed by [facility] urology, continue to discuss with patient and attempt to obtain [facility] records for review.

## 2019-10-20 NOTE — Assessment & Plan Note (Signed)
Chronic, stable with no decline on recent labs. Urine ALB 30 and A:C <30 last visit.  Continue Lisinopril for kidney protection.  Consider nephrology in future if any decline noted.

## 2019-10-20 NOTE — Assessment & Plan Note (Signed)
Chronic, ongoing.  A1C 10%, downward trend from previous of 13%, with urine ALB 30 and A:C <30 at last visit.  Will continue Lantus 40 units daily (recommended he take this dose daily) and Metformin 500 MG BID( can not tolerate more than this).  Continue Humalog 10 units and continue taking it with each meal as recently started, check BS consistently with each meal, instructed him NOT to take if does not eat a meal.  Will discuss Victoza with [facility] and see if we can restart this with any assistance, is only GLP he has tolerated.  May benefit from a sample Freestyle Libre to monitor sugars.  Return in 3 months.

## 2019-10-20 NOTE — Progress Notes (Addendum)
BP 123/72   Pulse (!) 59   Temp 97.8 F (36.6 C) (Oral)   Wt 222 lb (100.7 kg)   SpO2 98%   BMI 34.77 kg/m    Subjective:    Patient ID: Scott Roof., male    DOB: Dec 31, 1940, 79 y.o.   MRN: [place]:[id number]  HPI: Scott Lawson. is a 79 y.o. male  Chief Complaint  Patient presents with  . Diabetes    pt states he has not had a recent eye exam  . Hyperlipidemia  . Hypertension    DIABETES Last A1C 13% in February.  Currently is taking Lantus 35 MG (is ordered as 40 MG -- but is not taking this due to some 90's in morning), Humalog 10 units with meals (has only been taking it before breakfast and supper, until last week then started taking it with lunch), and Metformin 500 MG BID.  Stopped taking Victoza due to cost -- [facility] but this caused issues in past. Continues to see PCP at [facility] as well.  Working with CCM team in office. He reports feeling "a whole lot better" with medication changes.  He is checking BS 4 times a day at this time.    Dr. Meyer Russel is provider at [facility] [phone]. Hypoglycemic episodes:no Polydipsia/polyuria: no Visual disturbance: no Chest pain: no Paresthesias: no Glucose Monitoring: yes             Accucheck frequency: Daily             Fasting glucose: 115-120 with 35 units Lantus             Post prandial: breakfast 135-140, lunch 235 (no insulin) and 180 (with recently adding on insulin with lunch), and dinner 160-170             Evening: 150's             Before meals: 120-140's Taking Insulin?: yes             Long acting insulin: Lantus 40 units             Short acting insulin: Blood Pressure Monitoring: daily Retinal Examination: Not Up to Date -- [facility] in October 2019 Foot Exam: Up to Date Pneumovax: Up to Date Influenza: Up to Date Aspirin: yes   HYPERTENSION / HYPERLIPIDEMIA Continues on Lisinopril 10 MG and Lipitor 40 MG.  Recent CRT 1.26 and GFR 54. Satisfied  with current treatment? yes Duration of hypertension: chronic BP monitoring frequency: daily BP range: 110-120/70's range  BP medication side effects: no Duration of hyperlipidemia: chronic Cholesterol medication side effects: no Cholesterol supplements: none Medication compliance: good compliance Aspirin: yes Recent stressors: no Recurrent headaches: no Visual changes: no Palpitations: no Dyspnea: no Chest pain: no Lower extremity edema: no Dizzy/lightheaded: no   PROSTATE CANCER: Is followed at the [facility] in [place] and has blood draws every 60 days.  He states they told him "too old for surgery".   He reports he lost his sister, mother, father, and brother to cancer.  States he continues to see [place] providers for this.  Denies any current symptoms.  Continues on Tamsulosin.  Reports Dr. Meyer Russel recently pulled up his imaging and showed it to him at the [facility].  Relevant past medical, surgical, family and social history reviewed and updated as indicated. Interim medical history since our last visit reviewed. Allergies and medications reviewed and updated.  Review  of Systems  Constitutional: Negative for activity change, appetite change, diaphoresis, fatigue and fever.  Respiratory: Negative for cough, chest tightness and shortness of breath.   Cardiovascular: Negative for chest pain, palpitations and leg swelling.  Gastrointestinal: Negative.   Endocrine: Negative for cold intolerance, heat intolerance, polydipsia, polyphagia and polyuria.  Neurological: Negative.   Psychiatric/Behavioral: Negative.     Per HPI unless specifically indicated above     Objective:    BP 123/72   Pulse (!) 59   Temp 97.8 F (36.6 C) (Oral)   Wt 222 lb (100.7 kg)   SpO2 98%   BMI 34.77 kg/m   Wt Readings from Last 3 Encounters:  10/20/19 222 lb (100.7 kg)  09/15/19 220 lb (99.8 kg)  07/13/19 200 lb (90.7 kg)    Physical Exam Vitals and nursing note reviewed.  Constitutional:      General: He  is not in acute distress.    Appearance: He is well-developed and overweight. He is not ill-appearing.  HENT:     Head: Normocephalic and atraumatic.     Right Ear: Hearing normal. No drainage.     Left Ear: Hearing normal. No drainage.     Mouth/Throat:     Pharynx: Uvula midline.  Eyes:     General: Lids are normal.        Right eye: No discharge.        Left eye: No discharge.     Conjunctiva/sclera: Conjunctivae normal.     Pupils: Pupils are equal, round, and reactive to light.  Neck:     Thyroid: No thyromegaly.     Vascular: No carotid bruit.  Cardiovascular:     Rate and Rhythm: Normal rate and regular rhythm.     Heart sounds: Normal heart sounds, S1 normal and S2 normal. No murmur. No gallop.   Pulmonary:     Effort: Pulmonary effort is normal. No accessory muscle usage or respiratory distress.     Breath sounds: Normal breath sounds.  Abdominal:     General: Bowel sounds are normal.     Palpations: Abdomen is soft.  Musculoskeletal:        General: Normal range of motion.     Cervical back: Normal range of motion and neck supple.     Right lower leg: No edema.     Left lower leg: No edema.  Skin:    General: Skin is warm and dry.     Capillary Refill: Capillary refill takes less than 2 seconds.  Neurological:     Mental Status: He is alert and oriented to person, place, and time.  Psychiatric:        Attention and Perception: Attention normal.        Mood and Affect: Mood normal.        Speech: Speech normal.        Behavior: Behavior normal.        Thought Content: Thought content normal.    Diabetic Foot Exam - Simple   No data filed     Results for orders placed or performed in visit on 07/21/19  Comprehensive metabolic panel  Result Value Ref Range   Glucose 231 (H) 65 - 99 mg/dL   BUN 20 8 - 27 mg/dL   Creatinine, Ser 1.26 0.76 - 1.27 mg/dL   GFR calc non Af Amer 54 (L) >59 mL/min/1.73   GFR calc Af Amer 63 >59 mL/min/1.73   BUN/Creatinine Ratio  16 10 - 24  Sodium 139 134 - 144 mmol/L   Potassium 4.9 3.5 - 5.2 mmol/L   Chloride 99 96 - 106 mmol/L   CO2 24 20 - 29 mmol/L   Calcium 9.6 8.6 - 10.2 mg/dL   Total Protein 6.8 6.0 - 8.5 g/dL   Albumin 4.3 3.7 - 4.7 g/dL   Globulin, Total 2.5 1.5 - 4.5 g/dL   Albumin/Globulin Ratio 1.7 1.2 - 2.2   Bilirubin Total 0.9 0.0 - 1.2 mg/dL   Alkaline Phosphatase 79 39 - 117 IU/L   AST 16 0 - 40 IU/L   ALT 14 0 - 44 IU/L  Lipid Panel w/o Chol/HDL Ratio  Result Value Ref Range   Cholesterol, Total 200 (H) 100 - 199 mg/dL   Triglycerides 275 (H) 0 - 149 mg/dL   HDL 42 >39 mg/dL   VLDL Cholesterol Cal 47 (H) 5 - 40 mg/dL   LDL Chol Calc (NIH) 111 (H) 0 - 99 mg/dL  CBC with Differential/Platelet  Result Value Ref Range   WBC 7.2 3.4 - 10.8 x10E3/uL   RBC 4.75 4.14 - 5.80 x10E6/uL   Hemoglobin 14.5 13.0 - 17.7 g/dL   Hematocrit 42.9 37.5 - 51.0 %   MCV 90 79 - 97 fL   MCH 30.5 26.6 - 33.0 pg   MCHC 33.8 31.5 - 35.7 g/dL   RDW 12.4 11.6 - 15.4 %   Platelets 213 150 - 450 x10E3/uL   Neutrophils 63 Not Estab. %   Lymphs 26 Not Estab. %   Monocytes 8 Not Estab. %   Eos 2 Not Estab. %   Basos 1 Not Estab. %   Neutrophils Absolute 4.5 1.4 - 7.0 x10E3/uL   Lymphocytes Absolute 1.9 0.7 - 3.1 x10E3/uL   Monocytes Absolute 0.6 0.1 - 0.9 x10E3/uL   EOS (ABSOLUTE) 0.1 0.0 - 0.4 x10E3/uL   Basophils Absolute 0.1 0.0 - 0.2 x10E3/uL   Immature Granulocytes 0 Not Estab. %   Immature Grans (Abs) 0.0 0.0 - 0.1 x10E3/uL  Microalbumin, Urine Waived  Result Value Ref Range   Microalb, Ur Waived 30 (H) 0 - 19 mg/L   Creatinine, Urine Waived 100 10 - 300 mg/dL   Microalb/Creat Ratio <30 <30 mg/g  Bayer DCA Hb A1c Waived  Result Value Ref Range   HB A1C (BAYER DCA - WAIVED) 13.0 (H) <7.0 %      Assessment & Plan:   Problem List Items Addressed This Visit      Cardiovascular and Mediastinum   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HCC)    Chronic, stable with BP  at goal in office and at home, no orthostatic symptoms.  Continue current medication regimen and adjust as needed.  Urine ALB 30 and A:C <30 last visit, Lisinopril for kidney protection.  Recommend he continue to monitor BP regularly at home and document for provider.  Return to office in 3 months.      Atherosclerosis of aorta ([place])    Ongoing, noted on lung CT in 2019.  Recommend continued use of statin and ASA daily + avoid use of nicotine products.        Endocrine   Hyperlipidemia associated with type 2 diabetes mellitus (HCC)    Chronic, ongoing.  Continue current medication regimen and adjust as needed.  Lipid panel today.      Relevant Orders   Bayer DCA Hb A1c Waived   Lipid Panel w/o Chol/HDL Ratio   Uncontrolled type 2 diabetes mellitus  with chronic kidney disease ([place]) - Primary    Chronic, ongoing.  A1C 10%, downward trend from previous of 13%, with urine ALB 30 and A:C <30 at last visit.  Will continue Lantus 40 units daily (recommended he take this dose daily) and Metformin 500 MG BID( can not tolerate more than this).  Continue Humalog 10 units and continue taking it with each meal as recently started, check BS consistently with each meal, instructed him NOT to take if does not eat a meal.  Will discuss Victoza with [place] PharmD and see if we can restart this with any assistance, is only GLP he has tolerated.  May benefit from a sample Freestyle Libre to monitor sugars.  Return in 3 months.      Relevant Orders   Bayer DCA Hb A1c Waived     Genitourinary   CKD (chronic kidney disease), stage III (HCC)    Chronic, stable with no decline on recent labs. Urine ALB 30 and A:C <30 last visit.  Continue Lisinopril for kidney protection.  Consider nephrology in future if any decline noted.      Prostate cancer [facility])    Followed by [facility] urology, continue to discuss with patient and attempt to obtain [place] records for review.        Other   Long-term insulin use  ([place])    New use, refer to diabetes plan of care for dosing.  Continue daily insulin.          Follow up plan: Return in about 3 months (around 01/20/2020) for T2DM, HTN/HLD, CKD, GERD.

## 2019-10-20 NOTE — Assessment & Plan Note (Signed)
New use, refer to diabetes plan of care for dosing.  Continue daily insulin. 

## 2019-10-20 NOTE — Assessment & Plan Note (Signed)
Chronic, stable with BP at goal in office and at home, no orthostatic symptoms.  Continue current medication regimen and adjust as needed.  Urine ALB 30 and A:C <30 last visit, Lisinopril for kidney protection.  Recommend he continue to monitor BP regularly at home and document for provider.  Return to office in 3 months.

## 2019-10-20 NOTE — Patient Instructions (Signed)
Carbohydrate Counting for Diabetes Mellitus, Adult  Carbohydrate counting is a method of keeping track of how many carbohydrates you eat. Eating carbohydrates naturally increases the amount of sugar (glucose) in the blood. Counting how many carbohydrates you eat helps keep your blood glucose within normal limits, which helps you manage your diabetes (diabetes mellitus). It is important to know how many carbohydrates you can safely have in each meal. This is different for every person. A diet and nutrition specialist (registered dietitian) can help you make a meal plan and calculate how many carbohydrates you should have at each meal and snack. Carbohydrates are found in the following foods:  Grains, such as breads and cereals.  Dried beans and soy products.  Starchy vegetables, such as potatoes, peas, and corn.  Fruit and fruit juices.  Milk and yogurt.  Sweets and snack foods, such as cake, cookies, candy, chips, and soft drinks. How do I count carbohydrates? There are two ways to count carbohydrates in food. You can use either of the methods or a combination of both. Reading "Nutrition Facts" on packaged food The "Nutrition Facts" list is included on the labels of almost all packaged foods and beverages in the [place] It includes:  The serving size.  Information about nutrients in each serving, including the grams (g) of carbohydrate per serving. To use the "Nutrition Facts":  Decide how many servings you will have.  Multiply the number of servings by the number of carbohydrates per serving.  The resulting number is the total amount of carbohydrates that you will be having. Learning standard serving sizes of other foods When you eat carbohydrate foods that are not packaged or do not include "Nutrition Facts" on the label, you need to measure the servings in order to count the amount of carbohydrates:  Measure the foods that you will eat with a food scale or measuring cup, if  needed.  Decide how many standard-size servings you will eat.  Multiply the number of servings by 15. Most carbohydrate-rich foods have about 15 g of carbohydrates per serving. ? For example, if you eat 8 oz (170 g) of strawberries, you will have eaten 2 servings and 30 g of carbohydrates (2 servings x 15 g = 30 g).  For foods that have more than one food mixed, such as soups and casseroles, you must count the carbohydrates in each food that is included. The following list contains standard serving sizes of common carbohydrate-rich foods. Each of these servings has about 15 g of carbohydrates:   hamburger bun or  English muffin.   oz (15 mL) syrup.   oz (14 g) jelly.  1 slice of bread.  1 six-inch tortilla.  3 oz (85 g) cooked rice or pasta.  4 oz (113 g) cooked dried beans.  4 oz (113 g) starchy vegetable, such as peas, corn, or potatoes.  4 oz (113 g) hot cereal.  4 oz (113 g) mashed potatoes or  of a large baked potato.  4 oz (113 g) canned or frozen fruit.  4 oz (120 mL) fruit juice.  4-6 crackers.  6 chicken nuggets.  6 oz (170 g) unsweetened dry cereal.  6 oz (170 g) plain fat-free yogurt or yogurt sweetened with artificial sweeteners.  8 oz (240 mL) milk.  8 oz (170 g) fresh fruit or one small piece of fruit.  24 oz (680 g) popped popcorn. Example of carbohydrate counting Sample meal  3 oz (85 g) chicken breast.  6 oz (170 g)   brown rice.  4 oz (113 g) corn.  8 oz (240 mL) milk.  8 oz (170 g) strawberries with sugar-free whipped topping. Carbohydrate calculation 1. Identify the foods that contain carbohydrates: ? Rice. ? Corn. ? Milk. ? Strawberries. 2. Calculate how many servings you have of each food: ? 2 servings rice. ? 1 serving corn. ? 1 serving milk. ? 1 serving strawberries. 3. Multiply each number of servings by 15 g: ? 2 servings rice x 15 g = 30 g. ? 1 serving corn x 15 g = 15 g. ? 1 serving milk x 15 g = 15 g. ? 1  serving strawberries x 15 g = 15 g. 4. Add together all of the amounts to find the total grams of carbohydrates eaten: ? 30 g + 15 g + 15 g + 15 g = 75 g of carbohydrates total. Summary  Carbohydrate counting is a method of keeping track of how many carbohydrates you eat.  Eating carbohydrates naturally increases the amount of sugar (glucose) in the blood.  Counting how many carbohydrates you eat helps keep your blood glucose within normal limits, which helps you manage your diabetes.  A diet and nutrition specialist (registered dietitian) can help you make a meal plan and calculate how many carbohydrates you should have at each meal and snack. This information is not intended to replace advice given to you by your health care provider. Make sure you discuss any questions you have with your health care provider. Document Revised: 12/24/2016 Document Reviewed: 11/13/2015 Elsevier Patient Education  2020 [employer].  

## 2019-10-20 NOTE — Assessment & Plan Note (Signed)
Chronic, ongoing.  Continue current medication regimen and adjust as needed. Lipid panel today. 

## 2019-10-21 LAB — LIPID PANEL W/O CHOL/HDL RATIO
Cholesterol, Total: 182 mg/dL (ref 100–199)
HDL: 43 mg/dL (ref >39)
LDL Chol Calc (NIH): 92 mg/dL (ref 0–99)
Triglycerides: 283 mg/dL — ABNORMAL HIGH (ref 0–149)
VLDL Cholesterol Cal: 47 mg/dL — ABNORMAL HIGH (ref 5–40)

## 2019-10-21 NOTE — Progress Notes (Signed)
Please let Scott Lawson know his cholesterol levels have returned.  Still above goal to help with stroke prevention.  I would like to increase his Atorvastatin to 80 MG and then recheck his cholesterol levels on outpatient labs only in 6 weeks.  If he is okay with this let me know and I will send in this increased dose + order future labs.  Thank you.

## 2019-10-23 ENCOUNTER — Other Ambulatory Visit: Payer: Self-pay | Admitting: Nurse Practitioner

## 2019-10-23 DIAGNOSIS — E785 Hyperlipidemia, unspecified: Secondary | ICD-10-CM

## 2019-10-23 MED ORDER — ATORVASTATIN CALCIUM 80 MG PO TABS: 80.0000 mg | ORAL_TABLET | Freq: Every day | ORAL | 3 refills | Status: DC

## 2019-10-27 ENCOUNTER — Ambulatory Visit (INDEPENDENT_AMBULATORY_CARE_PROVIDER_SITE_OTHER): Payer: Medicare HMO | Admitting: Pharmacist

## 2019-10-27 NOTE — Chronic Care Management (AMB) (Signed)
Chronic Care Management   Follow Up Note   10/27/2019 Name: Princess Bruins Rosas [name] MRN: [id number] DOB: Oct 13, 1940  Referred by: Venita Lick, NP Reason for referral : Chronic Care Management (Medication Management)   [name] is a 79 y.o. year old male who is a primary care patient of Cannady, Barbaraann Faster, NP. The CCM team was consulted for assistance with chronic disease management and care coordination needs.    Met with patient face to face for medication management.   Review of patient status, including review of consultants reports, relevant laboratory and other test results, and collaboration with appropriate care team members and the patient's provider was performed as part of comprehensive patient evaluation and provision of chronic care management services.    SDOH (Social Determinants of Health) assessments performed: No See Care Plan activities for detailed interventions related to [facility])     Outpatient Encounter Medications as of 10/27/2019  Medication Sig Note  . insulin glargine (LANTUS) 100 UNIT/ML injection Inject 40 Units into the skin every evening. 1800 10/27/2019: 35 units QPM  . insulin lispro (HUMALOG KWIKPEN) 100 UNIT/ML KwikPen Inject 0.1 mLs (10 Units total) into the skin 3 (three) times daily before meals. Continue to check blood sugar after meals.   Marland Kitchen aspirin 81 MG tablet Take 81 mg by mouth daily. 07/13/2019: holding  . atorvastatin (LIPITOR) 80 MG tablet Take 1 tablet (80 mg total) by mouth daily.   . cyanocobalamin 1000 MCG tablet Take 1,000 mcg by mouth daily.   . fluticasone (FLONASE) 50 MCG/ACT nasal spray Place 2 sprays into both nostrils daily as needed.    [name] Take 0.5 tablets (10 mg total) by mouth daily.   . meloxicam (MOBIC) 15 MG tablet Take 15 mg by mouth daily. 07/13/2019: holding  . metFORMIN (GLUCOPHAGE) 500 MG tablet TAKE 1 TABLET TWICE DAILY WITH FOOD   . omeprazole (PRILOSEC) 20 MG capsule TAKE 1  CAPSULE (20 MG TOTAL) BY MOUTH DAILY.   . tamsulosin (FLOMAX) 0.4 MG CAPS capsule TAKE 1 CAPSULE EVERY DAY   . TRUE METRIX BLOOD GLUCOSE TEST test strip CHECK BLOOD SUGAR TWICE DAILY   . TRUEplus Lancets 33G MISC CHECK BLOOD SUGAR TWICE DAILY    No facility-administered encounter medications on file as of 10/27/2019.     Objective:   Goals Addressed            This Visit's Progress     Patient Stated   . "I want to manage my diabetes as best I can" (pt-stated)       CARE PLAN ENTRY (see longtitudinal plan of care for additional care plan information)  Current Barriers:  . Diabetes: uncontrolled; most recent A1c 10% o Discussed CGM placement. Placing sample CGM today o Patient is currently checking his sugar 6 times daily. Checking pre and post each meal  . Current antihyperglycemic regimen: metformin 500 mg BID, Lantus 40 units, Humalog 10 units TID o Max tolerated dose of metformin  o Hx GI upset with Ozempic therapy; [facility] would not [facility] Victoza o Intolerability to SGLT2 . Denies episodes of hypoglycemia . Current glucose readings:  o Fasting: 90-120s o Post meals: 180s . Cardiovascular risk reduction: o Current hypertensive regimen: lisinopril 10 mg daily, BP well controlled on recent visits o Current hyperlipidemia regimen: atorvastatin 80 mg daily; scheduled for lipid panel recheck s/p recent medication increase  Pharmacist Clinical Goal(s):  Marland Kitchen Over the next 90 days, patient with work  with PharmD and primary care provider to address optimized medication management  Interventions: . Comprehensive medication review performed, medication list updated in electronic medical record . Inter-disciplinary care team collaboration (see longitudinal plan of care) . Placed sample FreeStyle Libre CGM. Educated patient to scan at least Q6H to ensure we receive all necessary information. Scheduled f/u in 2 weeks for download and interpretation.  Marland Kitchen Pending patient being amenable, can  collaborate w/ PCP to order CCM long term, as patient is meeting Medicare criteria for CGM. Will discuss w/ patient in 2 weeks at follow up.  Patient Self Care Activities:  . Patient will check blood glucose BID, document, and provide at future appointments . Patient will take medications as prescribed . Patient will report any questions or concerns to provider   Please see past updates related to this goal by clicking on the "Past Updates" button in the selected goal           Plan:  - Scheduled f/u in ~ 2 weeks  Catie Darnelle Maffucci, PharmD, [place] [phone]

## 2019-10-27 NOTE — Patient Instructions (Signed)
Visit Information  Goals Addressed            This Visit's Progress     Patient Stated   . "I want to manage my diabetes as best I can" (pt-stated)       CARE PLAN ENTRY (see longtitudinal plan of care for additional care plan information)  Current Barriers:  . Diabetes: uncontrolled; most recent A1c 10% o Discussed CGM placement. Placing sample CGM today o Patient is currently checking his sugar 6 times daily. Checking pre and post each meal  . Current antihyperglycemic regimen: metformin 500 mg BID, Lantus 40 units, Humalog 10 units TID o Max tolerated dose of metformin  o Hx GI upset with Ozempic therapy; [facility] would not [facility] Victoza o Intolerability to SGLT2 . Denies episodes of hypoglycemia . Current glucose readings:  o Fasting: 90-120s o Post meals: 180s . Cardiovascular risk reduction: o Current hypertensive regimen: lisinopril 10 mg daily, BP well controlled on recent visits o Current hyperlipidemia regimen: atorvastatin 80 mg daily; scheduled for lipid panel recheck s/p recent medication increase  Pharmacist Clinical Goal(s):  Marland Kitchen Over the next 90 days, patient with work with PharmD and primary care provider to address optimized medication management  Interventions: . Comprehensive medication review performed, medication list updated in electronic medical record . Inter-disciplinary care team collaboration (see longitudinal plan of care) . Placed sample FreeStyle Libre CGM. Educated patient to scan at least Q6H to ensure we receive all necessary information. Scheduled f/u in 2 weeks for download and interpretation.  Marland Kitchen Pending patient being amenable, can collaborate w/ PCP to order CCM long term, as patient is meeting Medicare criteria for CGM. Will discuss w/ patient in 2 weeks at follow up.  Patient Self Care Activities:  . Patient will check blood glucose BID, document, and provide at future appointments . Patient will take medications as prescribed . Patient will  report any questions or concerns to provider   Please see past updates related to this goal by clicking on the "Past Updates" button in the selected goal          Patient verbalizes understanding of instructions provided today.  Plan:  - Scheduled f/u in ~ 2 weeks  Catie Darnelle Maffucci, PharmD, [place] [phone]

## 2019-11-06 ENCOUNTER — Telehealth: Payer: Self-pay | Admitting: Nurse Practitioner

## 2019-11-06 ENCOUNTER — Other Ambulatory Visit: Payer: Self-pay | Admitting: Nurse Practitioner

## 2019-11-06 NOTE — Telephone Encounter (Signed)
What do I need to instruct this patient to do?

## 2019-11-06 NOTE — Telephone Encounter (Signed)
Requested Prescriptions  Pending Prescriptions Disp Refills  . omeprazole (PRILOSEC) 20 MG capsule [Pharmacy Med Name: OMEPRAZOLE 20 MG Capsule Delayed Release] 90 capsule 3    Sig: TAKE 1 CAPSULE EVERY DAY     Gastroenterology: Proton Pump Inhibitors Passed - 11/06/2019  8:17 PM      Passed - Valid encounter within last 12 months    Recent Outpatient Visits          2 weeks ago Uncontrolled type 2 diabetes mellitus with chronic kidney disease ([place])   [place], Jolene T, NP   1 month ago Uncontrolled type 2 diabetes mellitus with chronic kidney disease ([place])   [place], Jolene T, NP   2 months ago Uncontrolled type 2 diabetes mellitus with chronic kidney disease ([place])   [place], Jolene T, NP   3 months ago Uncontrolled type 2 diabetes mellitus with chronic kidney disease ([place])   [place], Barbaraann Faster, NP   3 months ago Dark stools   [employer], [place] T, NP      Future Appointments            In 2 weeks  [facility], [place]   In 2 months Cannady, Barbaraann Faster, NP [employer] [facility], [facility]

## 2019-11-06 NOTE — Telephone Encounter (Signed)
Patient notified

## 2019-11-06 NOTE — Telephone Encounter (Signed)
Thank you :)

## 2019-11-06 NOTE — Telephone Encounter (Signed)
Since this was just a sample, tell patient not to worry and we will discuss at out in office appointment on Friday. Please remind him to bring the [place] reader with him.   (If it was a full prescription, I'd have him call the company and request a replacement sensor).

## 2019-11-06 NOTE — Telephone Encounter (Signed)
Copied from [place] [id number]. Topic: General - Inquiry >> Nov 06, 2019 10:36 AM Mathis Bud wrote: Reason for CRM: Patient states he would like a call back from PCP nurse sugar monitor that was put on arm last week. Patient states that the monitor fell off Saturday. Call back [phone]

## 2019-11-10 ENCOUNTER — Ambulatory Visit: Payer: Medicare HMO | Admitting: Pharmacist

## 2019-11-10 ENCOUNTER — Other Ambulatory Visit: Payer: Self-pay

## 2019-11-10 DIAGNOSIS — E1165 Type 2 diabetes mellitus with hyperglycemia: Secondary | ICD-10-CM | POA: Diagnosis not present

## 2019-11-10 DIAGNOSIS — E1122 Type 2 diabetes mellitus with diabetic chronic kidney disease: Secondary | ICD-10-CM | POA: Diagnosis not present

## 2019-11-10 DIAGNOSIS — IMO0002 Reserved for concepts with insufficient information to code with codable children: Secondary | ICD-10-CM

## 2019-11-10 NOTE — Chronic Care Management (AMB) (Signed)
Chronic Care Management   Follow Up Note   11/10/2019 Name: Scott H Petrosino Jr. MRN: [id number] DOB: 01/07/1941  Referred by: Cannady, Jolene T, NP Reason for referral : Chronic Care Management (Medication Management)   Scott H Stachowski Jr. is a 79 y.o. year old male who is a primary care patient of Cannady, Jolene T, NP. The CCM team was consulted for assistance with chronic disease management and care coordination needs.    Met with patient for [employer] 2 CGM download. Provided sample 2 weeks ago.  Review of patient status, including review of consultants reports, relevant laboratory and other test results, and collaboration with appropriate care team members and the patient's provider was performed as part of comprehensive patient evaluation and provision of chronic care management services.    SDOH (Social Determinants of Health) assessments performed: Yes See Care Plan activities for detailed interventions related to SDOH)     Outpatient Encounter Medications as of 11/10/2019  Medication Sig Note  . insulin glargine (LANTUS) 100 UNIT/ML injection Inject 40 Units into the skin every evening. 1800 11/10/2019: 30 units  . aspirin 81 MG tablet Take 81 mg by mouth daily. 07/13/2019: holding  . atorvastatin (LIPITOR) 80 MG tablet Take 1 tablet (80 mg total) by mouth daily.   . cyanocobalamin 1000 MCG tablet Take 1,000 mcg by mouth daily.   . fluticasone (FLONASE) 50 MCG/ACT nasal spray Place 2 sprays into both nostrils daily as needed.    . insulin lispro (HUMALOG KWIKPEN) 100 UNIT/ML KwikPen Inject 0.1 mLs (10 Units total) into the skin 3 (three) times daily before meals. Continue to check blood sugar after meals.   . lisinopril (ZESTRIL) 20 MG tablet Take 0.5 tablets (10 mg total) by mouth daily.   . meloxicam (MOBIC) 15 MG tablet Take 15 mg by mouth daily. 07/13/2019: holding  . metFORMIN (GLUCOPHAGE) 500 MG tablet TAKE 1 TABLET TWICE DAILY WITH FOOD   . omeprazole (PRILOSEC)  20 MG capsule TAKE 1 CAPSULE EVERY DAY   . tamsulosin (FLOMAX) 0.4 MG CAPS capsule TAKE 1 CAPSULE EVERY DAY   . TRUE METRIX BLOOD GLUCOSE TEST test strip CHECK BLOOD SUGAR TWICE DAILY   . TRUEplus Lancets 33G MISC CHECK BLOOD SUGAR TWICE DAILY    No facility-administered encounter medications on file as of 11/10/2019.     Objective:   Goals Addressed            This Visit's Progress     Patient Stated   . "I want to manage my diabetes as best I can" (pt-stated)       CARE PLAN ENTRY (see longtitudinal plan of care for additional care plan information)  Current Barriers:  . Diabetes: uncontrolled; most recent A1c 10% o CGM download today. Do not have the full 2 weeks as sensor fell off last weekend . Current antihyperglycemic regimen: metformin 500 mg BID, Lantus 30 units, Humalog 10 units TID o Max tolerated dose of metformin  o Hx GI upset with Ozempic therapy; [facility] not [place] Victoza o Intolerability to SGLT2 . Denies episodes of hypoglycemia . Current glucose readings: checking 4-6 times daily. See reading trends above . Cardiovascular risk reduction: o Current hypertensive regimen: lisinopril 10 mg daily, BP well controlled on recent visits o Current hyperlipidemia regimen: atorvastatin 80 mg daily; scheduled for lipid panel recheck s/p recent medication increase  Pharmacist Clinical Goal(s):  . Over the next 90 days, patient with work with PharmD and primary care provider to address optimized   medication management  Interventions: . Comprehensive medication review performed, medication list updated in electronic medical record . Inter-disciplinary care team collaboration (see longitudinal plan of care) . Reviewed results. Post breakfast hypo, but hyperglycemia after other meals. Adjust Humalog to now give 5 units with breakfast, 12 units with lunch, and 14 units with supper. Continue Lantus 30 units daily. Patient verbalizes understanding.  . Patient is interested in  pursuing [employer] coverage for [employer] 2 sensors. Will collaborate w/ office staff to pursue this at this time.  Patient Self Care Activities:  . Patient will check blood glucose BID, document, and provide at future appointments . Patient will take medications as prescribed . Patient will report any questions or concerns to provider   Please see past updates related to this goal by clicking on the "Past Updates" button in the selected goal           Plan:  - Scheduled f/u call in ~ 4 weeks  Catie Darnelle Maffucci, PharmD, [place] [phone]

## 2019-11-10 NOTE — Patient Instructions (Addendum)
It was great to see you today!  Continue taking Lantus 30 units daily  For Humalog:  Breakfast: 5 units Lunch: 12 units Supper: 14 units  Call me with any questions!  Catie Darnelle Maffucci, PharmD [phone]  Visit Information  Goals Addressed            This Visit's Progress     Patient Stated   . "I want to manage my diabetes as best I can" (pt-stated)       CARE PLAN ENTRY (see longtitudinal plan of care for additional care plan information)  Current Barriers:  . Diabetes: uncontrolled; most recent A1c 10% o CGM download today. Do not have the full 2 weeks as sensor fell off last weekend . Current antihyperglycemic regimen: metformin 500 mg BID, Lantus 30 units, Humalog 10 units TID o Max tolerated dose of metformin  o Hx GI upset with Ozempic therapy; [facility] not [facility] Victoza o Intolerability to SGLT2 . Denies episodes of hypoglycemia . Current glucose readings: checking 4-6 times daily. See reading trends above . Cardiovascular risk reduction: o Current hypertensive regimen: lisinopril 10 mg daily, BP well controlled on recent visits o Current hyperlipidemia regimen: atorvastatin 80 mg daily; scheduled for lipid panel recheck s/p recent medication increase  Pharmacist Clinical Goal(s):  Marland Kitchen Over the next 90 days, patient with work with PharmD and primary care provider to address optimized medication management  Interventions: . Comprehensive medication review performed, medication list updated in electronic medical record . Inter-disciplinary care team collaboration (see longitudinal plan of care) . Reviewed results. Post breakfast hypo, but hyperglycemia after other meals. Adjust Humalog to now give 5 units with breakfast, 12 units with lunch, and 14 units with supper. Continue Lantus 30 units daily. Patient verbalizes understanding.  . Patient is interested in pursuing [employer] coverage for [employer] 2 sensors. Will collaborate w/ office staff to pursue this at  this time.  Patient Self Care Activities:  . Patient will check blood glucose BID, document, and provide at future appointments . Patient will take medications as prescribed . Patient will report any questions or concerns to provider   Please see past updates related to this goal by clicking on the "Past Updates" button in the selected goal          Patient verbalizes understanding of instructions provided today.   Plan:  - Scheduled f/u call in ~ 4 weeks  Catie Darnelle Maffucci, PharmD, [place] [phone]

## 2019-11-10 NOTE — Chronic Care Management (AMB) (Signed)
  Chronic Care Management   Note  11/10/2019 Name: Scott Lawson. MRN: [id number] DOB: 1941/02/11  [name] [name] is a 79 y.o. year old male who is a primary care patient of Cannady, Barbaraann Faster, NP. The CCM team was consulted for assistance with chronic disease management and care coordination needs.

## 2019-11-21 ENCOUNTER — Ambulatory Visit: Payer: Self-pay | Admitting: Pharmacist

## 2019-11-21 DIAGNOSIS — IMO0002 Reserved for concepts with insufficient information to code with codable children: Secondary | ICD-10-CM

## 2019-11-21 NOTE — Patient Instructions (Signed)
Visit Information  Goals Addressed            This Visit's Progress     Patient Stated   . PharmD "I want to manage my diabetes as best I can" (pt-stated)       [place] (see longtitudinal plan of care for additional care plan information)  Current Barriers:  . Diabetes: uncontrolled; most recent A1c 10% o Working to see if [employer] is affordable therapy for this patient. . Current antihyperglycemic regimen: metformin 500 mg BID, Lantus 30 units, Humalog 10 units TID o Max tolerated dose of metformin  o Hx GI upset with Ozempic therapy; [facility] not [facility] Victoza o Intolerability to SGLT2 . Denies episodes of hypoglycemia . Current glucose readings: checking 4-6 times daily. See reading trends above . Cardiovascular risk reduction: o Current hypertensive regimen: lisinopril 10 mg daily, BP well controlled on recent visits o Current hyperlipidemia regimen: atorvastatin 80 mg daily; scheduled for lipid panel recheck s/p recent medication increase  Pharmacist Clinical Goal(s):  Marland Kitchen Over the next 90 days, patient with work with PharmD and primary care provider to address optimized medication management  Interventions: . Comprehensive medication review performed, medication list updated in electronic medical record . Inter-disciplinary care team collaboration (see longitudinal plan of care) . Contacted patient. He notes he spoke w/ [facility] this morning, and copay will unfortunately be $66/month, which he cannot afford.   Patient Self Care Activities:  . Patient will check blood glucose BID, document, and provide at future appointments . Patient will take medications as prescribed . Patient will report any questions or concerns to provider   Please see past updates related to this goal by clicking on the "Past Updates" button in the selected goal          Patient verbalizes understanding of instructions provided today.   Plan:  - Will outreach patient as  previously scheduled  Catie Darnelle Maffucci, PharmD, [place] [phone]

## 2019-11-21 NOTE — Chronic Care Management (AMB) (Signed)
Chronic Care Management   Follow Up Note   11/21/2019 Name: Scott Lawson. MRN: [id number] DOB: Sep 30, 1940  Referred by: Scott Lick, NP Reason for referral : Chronic Care Management (Medication Management)   Scott Lawson. is a 79 y.o. year old male who is a primary care patient of Scott Lawson, Scott Faster, NP. The CCM team was consulted for assistance with chronic disease management and care coordination needs.    Care coordination today.   Review of patient status, including review of consultants reports, relevant laboratory and other test results, and collaboration with appropriate care team members and the patient's provider was performed as part of comprehensive patient evaluation and provision of chronic care management services.    SDOH (Social Determinants of Health) assessments performed: Yes See Care Plan activities for detailed interventions related to [facility])     Outpatient Encounter Medications as of 11/21/2019  Medication Sig Note  . aspirin 81 MG tablet Take 81 mg by mouth daily. 07/13/2019: holding  . atorvastatin (LIPITOR) 80 MG tablet Take 1 tablet (80 mg total) by mouth daily.   . cyanocobalamin 1000 MCG tablet Take 1,000 mcg by mouth daily.   . fluticasone (FLONASE) 50 MCG/ACT nasal spray Place 2 sprays into both nostrils daily as needed.    . insulin glargine (LANTUS) 100 UNIT/ML injection Inject 40 Units into the skin every evening. 1800 11/10/2019: 30 units  . insulin lispro (HUMALOG KWIKPEN) 100 UNIT/ML KwikPen Inject 0.1 mLs (10 Units total) into the skin 3 (three) times daily before meals. Continue to check blood sugar after meals.   [name] Take 0.5 tablets (10 mg total) by mouth daily.   . meloxicam (MOBIC) 15 MG tablet Take 15 mg by mouth daily. 07/13/2019: holding  . metFORMIN (GLUCOPHAGE) 500 MG tablet TAKE 1 TABLET TWICE DAILY WITH FOOD   . omeprazole (PRILOSEC) 20 MG capsule TAKE 1 CAPSULE EVERY DAY   . tamsulosin  (FLOMAX) 0.4 MG CAPS capsule TAKE 1 CAPSULE EVERY DAY   . TRUE METRIX BLOOD GLUCOSE TEST test strip CHECK BLOOD SUGAR TWICE DAILY   . TRUEplus Lancets 33G MISC CHECK BLOOD SUGAR TWICE DAILY    No facility-administered encounter medications on file as of 11/21/2019.     Objective:   Goals Addressed            This Visit's Progress     Patient Stated   . PharmD "I want to manage my diabetes as best I can" (pt-stated)       [place] (see longtitudinal plan of care for additional care plan information)  Current Barriers:  . Diabetes: uncontrolled; most recent A1c 10% o Working to see if [employer] is affordable therapy for this patient. . Current antihyperglycemic regimen: metformin 500 mg BID, Lantus 30 units, Humalog 10 units TID o Max tolerated dose of metformin  o Hx GI upset with Ozempic therapy; [facility] would not [facility] Victoza o Intolerability to SGLT2 . Denies episodes of hypoglycemia . Current glucose readings: checking 4-6 times daily. See reading trends above . Cardiovascular risk reduction: o Current hypertensive regimen: lisinopril 10 mg daily, BP well controlled on recent visits o Current hyperlipidemia regimen: atorvastatin 80 mg daily; scheduled for lipid panel recheck s/p recent medication increase  Pharmacist Clinical Goal(s):  Marland Kitchen Over the next 90 days, patient with work with PharmD and primary care provider to address optimized medication management  Interventions: . Comprehensive medication review performed, medication list updated in  electronic medical record . Inter-disciplinary care team collaboration (see longitudinal plan of care) . Contacted patient. He notes he spoke w/ [facility] this morning, and copay will unfortunately be $66/month, which he cannot afford.   Patient Self Care Activities:  . Patient will check blood glucose BID, document, and provide at future appointments . Patient will take medications as prescribed . Patient will report  any questions or concerns to provider   Please see past updates related to this goal by clicking on the "Past Updates" button in the selected goal           Plan:  - Will outreach patient as previously scheduled  Catie Darnelle Maffucci, PharmD, [place] [phone]

## 2019-11-23 ENCOUNTER — Ambulatory Visit (INDEPENDENT_AMBULATORY_CARE_PROVIDER_SITE_OTHER): Payer: Medicare HMO

## 2019-11-23 DIAGNOSIS — Z0000 Encounter for general adult medical examination without abnormal findings: Secondary | ICD-10-CM | POA: Diagnosis not present

## 2019-11-23 NOTE — Progress Notes (Signed)
Subjective:   Scott Lawson. is a 79 y.o. male who presents for Medicare Annual/Subsequent preventive examination.  I connected with  To Scott Lawson day by telephone and verified that I am speaking with the correct person using two identifiers. Location patient: home Location provider: work Persons participating in the virtual visit: patient, Marine scientist.    I discussed the limitations, risks, security and privacy concerns of performing an evaluation and management service by telephone and the availability of in person appointments. I also discussed with the patient that there may be a patient responsible charge related to this service. The patient expressed understanding and verbally consented to this telephonic visit.    Interactive audio and video telecommunications were attempted between this provider and patient, however failed, due to patient having technical difficulties OR patient did not have access to video capability.  We continued and completed visit with audio only.  Some vital signs may be absent or patient reported.   Time Spent with patient on telephone encounter: 20 minutes  Review of Systems:   Cardiac Risk Factors include: advanced age (>9men, >27 women);diabetes mellitus;dyslipidemia;male gender;hypertension     Objective:    Vitals: There were no vitals taken for this visit.  There is no height or weight on file to calculate BMI.  Advanced Directives 11/23/2019 11/21/2018 12/23/2017 11/19/2017 08/02/2017 07/29/2017 11/18/2016  Does Patient Have a Medical Advance Directive? No No No No No No Yes  Type of Advance Directive - - - - - - Press photographer;Living will  Does patient want to make changes to medical advance directive? - - - Yes (MAU/Ambulatory/Procedural Areas - Information given) - - -  Copy of [place] in Chart? - - - - - - No - copy requested  Would patient like information on creating a medical advance directive? - - No -  Patient declined - No - Patient declined No - Patient declined -    Tobacco Social History   Tobacco Use  Smoking Status Never Smoker  Smokeless Tobacco Never Used     Counseling given: Not Answered   Clinical Intake:  Pre-visit preparation completed: Yes  Pain : No/denies pain     Nutritional Risks: None Diabetes: Yes CBG done?: No Did pt. bring in CBG monitor from home?: No  How often do you need to have someone help you when you read instructions, pamphlets, or other written materials from your doctor or pharmacy?: 1 - Never  Interpreter Needed?: No  Information entered by :: Scott Sofranko,LPN  Past Medical History:  Diagnosis Date  . BPH (benign prostatic hyperplasia)   . Diabetes mellitus without complication ([place])   . Elevated PSA   . GERD (gastroesophageal reflux disease)   . Hypertension   . Hypertriglyceridemia   . Nocturia    Past Surgical History:  Procedure Laterality Date  . APPENDECTOMY    . CARPAL TUNNEL RELEASE Left 12/11/2015   Procedure: CARPAL TUNNEL RELEASE;  Surgeon: Earnestine Leys, MD;  Location: [facility] ORS;  Service: Orthopedics;  Laterality: Left;  . EYE SURGERY     laser  . TRANSURETHRAL RESECTION OF PROSTATE N/A 08/02/2017   Procedure: TRANSURETHRAL RESECTION OF THE PROSTATE (TURP);  Surgeon: Hollice Espy, MD;  Location: [facility] ORS;  Service: Urology;  Laterality: N/A;   Family History  Problem Relation Age of Onset  . Cancer Mother        lung  . Heart disease Father   . Diabetes Brother   .  Prostate cancer Neg Hx   . Bladder Cancer Neg Hx   . Kidney cancer Neg Hx    Social History   Socioeconomic History  . Marital status: Married    Spouse name: Not on file  . Number of children: Not on file  . Years of education: Not on file  . Highest education level: High school graduate  Occupational History  . Occupation: retired  Tobacco Use  . Smoking status: Never Smoker  . Smokeless tobacco: Never Used  Vaping Use  . Vaping  Use: Never used  Substance and Sexual Activity  . Alcohol use: No    Alcohol/week: 0.0 standard drinks  . Drug use: No  . Sexual activity: Yes  Other Topics Concern  . Not on file  Social History Narrative  . Not on file   Social Determinants of Health   Financial Resource Strain: Medium Risk  . Difficulty of Paying Living Expenses: Somewhat hard  Food Insecurity: No Food Insecurity  . Worried About Charity fundraiser in the Last Year: Never true  . Ran Out of Food in the Last Year: Never true  Transportation Needs: No Transportation Needs  . Lack of Transportation (Medical): No  . Lack of Transportation (Non-Medical): No  Physical Activity: Inactive  . Days of Exercise per Week: 0 days  . Minutes of Exercise per Session: 0 min  Stress:   . Feeling of Stress :   Social Connections: Socially Isolated  . Frequency of Communication with Friends and Family: More than three times a week  . Frequency of Social Gatherings with Friends and Family: More than three times a week  . Attends Religious Services: Never  . Active Member of Clubs or Organizations: No  . Attends Archivist Meetings: Never  . Marital Status: Widowed    Outpatient Encounter Medications as of 11/23/2019  Medication Sig  . atorvastatin (LIPITOR) 80 MG tablet Take 1 tablet (80 mg total) by mouth daily.  . cyanocobalamin 1000 MCG tablet Take 1,000 mcg by mouth daily.  . fluticasone (FLONASE) 50 MCG/ACT nasal spray Place 2 sprays into both nostrils daily as needed.   . insulin glargine (LANTUS) 100 UNIT/ML injection Inject 40 Units into the skin every evening. 1800  . insulin lispro (HUMALOG KWIKPEN) 100 UNIT/ML KwikPen Inject 0.1 mLs (10 Units total) into the skin 3 (three) times daily before meals. Continue to check blood sugar after meals.  [name] Take 0.5 tablets (10 mg total) by mouth daily.  . metFORMIN (GLUCOPHAGE) 500 MG tablet TAKE 1 TABLET TWICE DAILY WITH FOOD  .  omeprazole (PRILOSEC) 20 MG capsule TAKE 1 CAPSULE EVERY DAY  . tamsulosin (FLOMAX) 0.4 MG CAPS capsule TAKE 1 CAPSULE EVERY DAY  . TRUE METRIX BLOOD GLUCOSE TEST test strip CHECK BLOOD SUGAR TWICE DAILY  . TRUEplus Lancets 33G MISC CHECK BLOOD SUGAR TWICE DAILY  . aspirin 81 MG tablet Take 81 mg by mouth daily. (Patient not taking: Reported on 11/23/2019)  . meloxicam (MOBIC) 15 MG tablet Take 15 mg by mouth daily. (Patient not taking: Reported on 11/23/2019)   No facility-administered encounter medications on file as of 11/23/2019.    Activities of Daily Living In your present state of health, do you have any difficulty performing the following activities: 11/23/2019  Hearing? N  Comment no hearing aids  Vision? N  Comment no glasses [facility]  Difficulty concentrating or making decisions? N  Walking or climbing stairs? N  Dressing or bathing? N  Doing errands, shopping? N  Preparing Food and eating ? N  Using the Toilet? N  In the past six months, have you accidently leaked urine? N  Do you have problems with loss of bowel control? N  Managing your Medications? N  Managing your Finances? N  Housekeeping or managing your Housekeeping? N  Some recent data might be hidden    Patient Care Team: Venita Lick, NP as PCP - General (Nurse Practitioner) Hollice Espy, MD as Consulting Physician (Urology) De Hollingshead, [facility] as Pharmacist   Assessment:   This is a routine wellness examination for Scott Lawson.  Exercise Activities and Dietary recommendations Current Exercise Habits: The patient does not participate in regular exercise at present, Exercise limited by: None identified  Goals Addressed   None     Fall Risk: Fall Risk  11/23/2019 12/29/2018 11/21/2018 11/19/2017 11/18/2016  Falls in the past year? 0 0 0 No Yes  Number falls in past yr: 0 0 - - 1  Injury with Fall? 0 0 - - Yes  Comment - - - - -  Follow up - - - - Falls prevention discussed    FALL RISK  PREVENTION PERTAINING TO THE HOME:  Any stairs in or around the home? Yes  If so, are there any without handrails? No   Home free of loose throw rugs in walkways, pet beds, electrical cords, etc? Yes  Adequate lighting in your home to reduce risk of falls? Yes   ASSISTIVE DEVICES UTILIZED TO PREVENT FALLS:  Life alert? No  Use of a cane, walker or w/c? No  Grab bars in the bathroom? No  Shower chair or bench in shower? No  Elevated toilet seat or a handicapped toilet? No   TIMED UP AND GO:  Unable to perform   Depression Screen PHQ 2/9 Scores 11/23/2019 11/21/2018 11/19/2017 11/18/2016  PHQ - 2 Score 0 1 0 0  PHQ- 9 Score - - - 0    Cognitive Function     6CIT Screen 11/21/2018 11/19/2017 11/18/2016  What Year? 0 points 0 points 0 points  What month? 0 points 0 points 0 points  What time? 0 points 0 points 0 points  Count back from 20 0 points 0 points 0 points  Months in reverse 0 points 0 points 0 points  Repeat phrase 0 points 2 points 4 points  Total Score 0 2 4    Immunization History  Administered Date(s) Administered  . Fluad Quad(high Dose 65+) 03/31/2019  . Influenza, High Dose Seasonal PF 03/03/2016, 03/08/2017  . Influenza,inj,Quad PF,6+ Mos 05/15/2015  . Influenza-Unspecified 02/19/2018  . Pneumococcal Conjugate-13 10/16/2013, 04/15/2017  . Pneumococcal Polysaccharide-23 12/06/2012  . Tdap 11/23/2013    Qualifies for Shingles Vaccine? yes Zostavax completed n/a. Due for Shingrix. Education has been provided regarding the importance of this vaccine. Pt has been advised to call insurance company to determine out of pocket expense. Advised may also receive vaccine at local pharmacy or Health Dept. Verbalized acceptance and understanding.  Tdap: up to date   Flu Vaccine: up to date   Pneumococcal Vaccine: up to date   Covid-19 Vaccine: declined  Screening Tests Health Maintenance  Topic Date Due  . Hepatitis C Screening  Never done  . OPHTHALMOLOGY EXAM   09/28/2018  . COVID-19 Vaccine (1) 10/19/2020 (Originally 07/10/1952)  . INFLUENZA VACCINE  01/14/2020  . HEMOGLOBIN A1C  04/21/2020  . FOOT EXAM  07/20/2020  . TETANUS/TDAP  11/24/2023  . PNA vac Low Risk Adult  Completed   Cancer Screenings:  Colorectal Screening: no longer required   Lung Cancer Screening: (Low Dose CT Chest recommended if Age 63-80 years, 30 pack-year currently smoking OR have quit w/in 15years.) does not qualify.     Additional Screening:  Hepatitis C Screening: does not qualify  Vision Screening: Recommended annual ophthalmology exams for early detection of glaucoma and other disorders of the eye. Is the patient up to date with their annual eye exam?  Yes  Who is the provider or what is the name of the office in which the pt attends annual eye exams? [facility]   Dental Screening: Recommended annual dental exams for proper oral hygiene  Community Resource Referral:  CRR required this visit?  No        Plan:  I have personally reviewed and addressed the Medicare Annual Wellness questionnaire and have noted the following in the patient's chart:  A. Medical and social history B. Use of alcohol, tobacco or illicit drugs  C. Current medications and supplements D. Functional ability and status E.  Nutritional status F.  Physical activity G. Advance directives H. List of other physicians I.  Hospitalizations, surgeries, and ER visits in previous 12 months J.  [place] such as hearing and vision if needed, cognitive and depression L. Referrals and appointments   In addition, I have reviewed and discussed with patient certain preventive protocols, quality metrics, and best practice recommendations. A written personalized care plan for preventive services as well as general preventive health recommendations were provided to patient.   Due to this being a telephonic visit, the after visit summary with patients personalized plan was  offered to patient via mail or my-chart.Patient declined at this time.  Signed,   Bevelyn Ngo, LPN  6/80/3212 Nurse Health Advisor   Nurse Notes:  Patient is confused about his insulins, feels his blood sugars are all over the place. Will send message to Catie to call patient for education/assistance.

## 2019-11-23 NOTE — Patient Instructions (Signed)
Scott Lawson , Thank you for taking time to come for your Medicare Wellness Visit. I appreciate your ongoing commitment to your health goals. Please review the following plan we discussed and let me know if I can assist you in the future.   Screening recommendations/referrals: Colonoscopy: no longer required  Recommended yearly ophthalmology/optometry visit for glaucoma screening and checkup Recommended yearly dental visit for hygiene and checkup  Vaccinations: Influenza vaccine: due 02/2020 Pneumococcal vaccine: completed series  Tdap vaccine: up to date  Shingles vaccine: shingrix eligible, check with [facility] for information    Covid-19: declined  Advanced directives: Advance directive discussed with you today. Once this is complete please bring a copy in to our office so we can scan it into your chart.  Conditions/risks identified: diabetic, in contact with chronic care management team   Next appointment: Follow up in one year for your annual wellness visit.   Preventive Care 78 Years and Older, Male Preventive care refers to lifestyle choices and visits with your health care provider that can promote health and wellness. What does preventive care include?  A yearly physical exam. This is also called an annual well check.  Dental exams once or twice a year.  Routine eye exams. Ask your health care provider how often you should have your eyes checked.  Personal lifestyle choices, including:  Daily care of your teeth and gums.  Regular physical activity.  Eating a healthy diet.  Avoiding tobacco and drug use.  Limiting alcohol use.  Practicing safe sex.  Taking low doses of aspirin every day.  Taking vitamin and mineral supplements as recommended by your health care provider. What happens during an annual well check? The services and screenings done by your health care provider during your annual well check will depend on your age, overall health, lifestyle risk factors,  and family history of disease. Counseling  Your health care provider may ask you questions about your:  Alcohol use.  Tobacco use.  Drug use.  Emotional well-being.  Home and relationship well-being.  Sexual activity.  Eating habits.  History of falls.  Memory and ability to understand (cognition).  Work and work Statistician. Screening  You may have the following tests or measurements:  Height, weight, and BMI.  Blood pressure.  Lipid and cholesterol levels. These may be checked every 5 years, or more frequently if you are over 44 years old.  Skin check.  Lung cancer screening. You may have this screening every year starting at age 78 if you have a 30-pack-year history of smoking and currently smoke or have quit within the past 15 years.  Fecal occult blood test (FOBT) of the stool. You may have this test every year starting at age 37.  Flexible sigmoidoscopy or colonoscopy. You may have a sigmoidoscopy every 5 years or a colonoscopy every 10 years starting at age 55.  Prostate cancer screening. Recommendations will vary depending on your family history and other risks.  Hepatitis C blood test.  Hepatitis B blood test.  Sexually transmitted disease (STD) testing.  Diabetes screening. This is done by checking your blood sugar (glucose) after you have not eaten for a while (fasting). You may have this done every 1-3 years.  Abdominal aortic aneurysm (AAA) screening. You may need this if you are a current or former smoker.  Osteoporosis. You may be screened starting at age 45 if you are at high risk. Talk with your health care provider about your test results, treatment options, and if necessary,  the need for more tests. Vaccines  Your health care provider may recommend certain vaccines, such as:  Influenza vaccine. This is recommended every year.  Tetanus, diphtheria, and acellular pertussis (Tdap, Td) vaccine. You may need a Td booster every 10  years.  Zoster vaccine. You may need this after age 66.  Pneumococcal 13-valent conjugate (PCV13) vaccine. One dose is recommended after age 8.  Pneumococcal polysaccharide (PPSV23) vaccine. One dose is recommended after age 78. Talk to your health care provider about which screenings and vaccines you need and how often you need them. This information is not intended to replace advice given to you by your health care provider. Make sure you discuss any questions you have with your health care provider. Document Released: 06/28/2015 Document Revised: 02/19/2016 Document Reviewed: 04/02/2015 Elsevier Interactive Patient Education  2017 [place] Prevention in the Home Falls can cause injuries. They can happen to people of all ages. There are many things you can do to make your home safe and to help prevent falls. What can I do on the outside of my home?  Regularly fix the edges of walkways and driveways and fix any cracks.  Remove anything that might make you trip as you walk through a door, such as a raised step or threshold.  Trim any bushes or trees on the path to your home.  Use bright outdoor lighting.  Clear any walking paths of anything that might make someone trip, such as rocks or tools.  Regularly check to see if handrails are loose or broken. Make sure that both sides of any steps have handrails.  Any raised decks and porches should have guardrails on the edges.  Have any leaves, snow, or ice cleared regularly.  Use sand or salt on walking paths during winter.  Clean up any spills in your garage right away. This includes oil or grease spills. What can I do in the bathroom?  Use night lights.  Install grab bars by the toilet and in the tub and shower. Do not use towel bars as grab bars.  Use non-skid mats or decals in the tub or shower.  If you need to sit down in the shower, use a plastic, non-slip stool.  Keep the floor dry. Clean up any water that  spills on the floor as soon as it happens.  Remove soap buildup in the tub or shower regularly.  Attach bath mats securely with double-sided non-slip rug tape.  Do not have throw rugs and other things on the floor that can make you trip. What can I do in the bedroom?  Use night lights.  Make sure that you have a light by your bed that is easy to reach.  Do not use any sheets or blankets that are too big for your bed. They should not hang down onto the floor.  Have a firm chair that has side arms. You can use this for support while you get dressed.  Do not have throw rugs and other things on the floor that can make you trip. What can I do in the kitchen?  Clean up any spills right away.  Avoid walking on wet floors.  Keep items that you use a lot in easy-to-reach places.  If you need to reach something above you, use a strong step stool that has a grab bar.  Keep electrical cords out of the way.  Do not use floor polish or wax that makes floors slippery. If you must use  wax, use non-skid floor wax.  Do not have throw rugs and other things on the floor that can make you trip. What can I do with my stairs?  Do not leave any items on the stairs.  Make sure that there are handrails on both sides of the stairs and use them. Fix handrails that are broken or loose. Make sure that handrails are as long as the stairways.  Check any carpeting to make sure that it is firmly attached to the stairs. Fix any carpet that is loose or worn.  Avoid having throw rugs at the top or bottom of the stairs. If you do have throw rugs, attach them to the floor with carpet tape.  Make sure that you have a light switch at the top of the stairs and the bottom of the stairs. If you do not have them, ask someone to add them for you. What else can I do to help prevent falls?  Wear shoes that:  Do not have high heels.  Have rubber bottoms.  Are comfortable and fit you well.  Are closed at the  toe. Do not wear sandals.  If you use a stepladder:  Make sure that it is fully opened. Do not climb a closed stepladder.  Make sure that both sides of the stepladder are locked into place.  Ask someone to hold it for you, if possible.  Clearly mark and make sure that you can see:  Any grab bars or handrails.  First and last steps.  Where the edge of each step is.  Use tools that help you move around (mobility aids) if they are needed. These include:  Canes.  Walkers.  Scooters.  Crutches.  Turn on the lights when you go into a dark area. Replace any light bulbs as soon as they burn out.  Set up your furniture so you have a clear path. Avoid moving your furniture around.  If any of your floors are uneven, fix them.  If there are any pets around you, be aware of where they are.  Review your medicines with your doctor. Some medicines can make you feel dizzy. This can increase your chance of falling. Ask your doctor what other things that you can do to help prevent falls. This information is not intended to replace advice given to you by your health care provider. Make sure you discuss any questions you have with your health care provider. Document Released: 03/28/2009 Document Revised: 11/07/2015 Document Reviewed: 07/06/2014 Elsevier Interactive Patient Education  2017 [employer].

## 2019-12-04 ENCOUNTER — Other Ambulatory Visit: Payer: Self-pay

## 2019-12-08 ENCOUNTER — Ambulatory Visit (INDEPENDENT_AMBULATORY_CARE_PROVIDER_SITE_OTHER): Payer: Medicare HMO | Admitting: Pharmacist

## 2019-12-08 DIAGNOSIS — K219 Gastro-esophageal reflux disease without esophagitis: Secondary | ICD-10-CM

## 2019-12-08 DIAGNOSIS — E1165 Type 2 diabetes mellitus with hyperglycemia: Secondary | ICD-10-CM

## 2019-12-08 DIAGNOSIS — E1122 Type 2 diabetes mellitus with diabetic chronic kidney disease: Secondary | ICD-10-CM

## 2019-12-08 DIAGNOSIS — IMO0002 Reserved for concepts with insufficient information to code with codable children: Secondary | ICD-10-CM

## 2019-12-08 NOTE — Chronic Care Management (AMB) (Signed)
Chronic Care Management   Follow Up Note   12/08/2019 Name: Scott Lawson. MRN: [id number] DOB: [birth year]  Referred by: Scott Lick, NP Reason for referral : Chronic Care Management (Medication Management)   Scott Lawson. is a 79 y.o. year old male who is a primary care patient of Scott Lawson, Scott Faster, NP. The CCM team was consulted for assistance with chronic disease management and care coordination needs.    Contacted patient for medication management review.   Review of patient status, including review of consultants reports, relevant laboratory and other test results, and collaboration with appropriate care team members and the patient's provider was performed as part of comprehensive patient evaluation and provision of chronic care management services.    SDOH (Social Determinants of Health) assessments performed: No See Care Plan activities for detailed interventions related to [facility])     Outpatient Encounter Medications as of 12/08/2019  Medication Sig Note  . aspirin 81 MG tablet Take 81 mg by mouth daily. (Patient not taking: Reported on 11/23/2019) 07/13/2019: holding  . atorvastatin (LIPITOR) 80 MG tablet Take 1 tablet (80 mg total) by mouth daily.   . cyanocobalamin 1000 MCG tablet Take 1,000 mcg by mouth daily.   . fluticasone (FLONASE) 50 MCG/ACT nasal spray Place 2 sprays into both nostrils daily as needed.    . insulin glargine (LANTUS) 100 UNIT/ML injection Inject 40 Units into the skin every evening. 1800 11/10/2019: 30 units  . insulin lispro (HUMALOG KWIKPEN) 100 UNIT/ML KwikPen Inject 0.1 mLs (10 Units total) into the skin 3 (three) times daily before meals. Continue to check blood sugar after meals.   [name] Take 0.5 tablets (10 mg total) by mouth daily.   . meloxicam (MOBIC) 15 MG tablet Take 15 mg by mouth daily. (Patient not taking: Reported on 11/23/2019)   . metFORMIN (GLUCOPHAGE) 500 MG tablet TAKE 1 TABLET TWICE  DAILY WITH FOOD   . omeprazole (PRILOSEC) 20 MG capsule TAKE 1 CAPSULE EVERY DAY   . tamsulosin (FLOMAX) 0.4 MG CAPS capsule TAKE 1 CAPSULE EVERY DAY   . TRUE METRIX BLOOD GLUCOSE TEST test strip CHECK BLOOD SUGAR TWICE DAILY   . TRUEplus Lancets 33G MISC CHECK BLOOD SUGAR TWICE DAILY    No facility-administered encounter medications on file as of 12/08/2019.     Objective:   Goals Addressed              This Visit's Progress     Patient Stated   .  PharmD "I want to manage my diabetes as best I can" (pt-stated)        [place] (see longtitudinal plan of care for additional care plan information)  Current Barriers:  . Diabetes: uncontrolled; most recent A1c 10% o Notes today that he was told to hold ASA 81 mg daily and meloxicam 15 mg daily. Is unsure if he should continue to hold these. Notes that he was told that ulcers were healing w/ omeprazole . Current antihyperglycemic regimen: metformin 500 mg BID, Lantus 30 units (however, reports that he will sometimes give 25-28 units if he is afraid his sugar will drop low overnight), Humalog 10/24/12 units with meals - has been holding insulins while hold o Max tolerated dose of metformin  o Hx GI upset with Ozempic therapy; [facility] not [facility] Victoza o Intolerability to SGLT2 . Denies episodes of hypoglycemia . Current glucose readings:  o Fasting: ~130-135 o After breakfast: ~110-115 o  After lunch: ~175-180s o After supper: ~190-200s . Over the next 90 days, patient with work with PharmD and primary care provider to address optimized medication management  Interventions: . Comprehensive medication review performed, medication list updated in electronic medical record . Inter-disciplinary care team collaboration (see longitudinal plan of care) . Encouraged to continue to hold meloxicam and ASA until further instruction from GI provider at [facility]. Likely minimize use of NSAIDs moving forward if possible . Reviewed goal A1c,  goal fasting, and goal 2 hour post prandial sugar readings. Considered increasing supper insulin dose, but then patient worries about overnight hypoglycemia. Continue current regimen. Encouraged to bring glucose readings with him to next PCP visit  Patient Self Care Activities:  . Patient will check blood glucose BID, document, and provide at future appointments . Patient will take medications as prescribed . Patient will report any questions or concerns to provider   Please see past updates related to this goal by clicking on the "Past Updates" button in the selected goal           Plan:  - Scheduled f/u call in ~ 12 weeks  Scott Lawson, PharmD, [place] [phone]

## 2019-12-08 NOTE — Patient Instructions (Signed)
Visit Information  Goals Addressed              This Visit's Progress     Patient Stated   .  PharmD "I want to manage my diabetes as best I can" (pt-stated)        [place] (see longtitudinal plan of care for additional care plan information)  Current Barriers:  . Diabetes: uncontrolled; most recent A1c 10% o Notes today that he was told to hold ASA 81 mg daily and meloxicam 15 mg daily. Is unsure if he should continue to hold these. Notes that he was told that ulcers were healing w/ omeprazole . Current antihyperglycemic regimen: metformin 500 mg BID, Lantus 30 units (however, reports that he will sometimes give 25-28 units if he is afraid his sugar will drop low overnight), Humalog 10/24/12 units with meals - has been holding insulins while hold o Max tolerated dose of metformin  o Hx GI upset with Ozempic therapy; [facility] would not [facility] Victoza o Intolerability to SGLT2 . Denies episodes of hypoglycemia . Current glucose readings:  o Fasting: ~130-135 o After breakfast: ~110-115 o After lunch: ~175-180s o After supper: ~190-200s . Over the next 90 days, patient with work with PharmD and primary care provider to address optimized medication management  Interventions: . Comprehensive medication review performed, medication list updated in electronic medical record . Inter-disciplinary care team collaboration (see longitudinal plan of care) . Encouraged to continue to hold meloxicam and ASA until further instruction from GI provider at [facility]. Likely minimize use of NSAIDs moving forward if possible . Reviewed goal A1c, goal fasting, and goal 2 hour post prandial sugar readings. Considered increasing supper insulin dose, but then patient worries about overnight hypoglycemia. Continue current regimen. Encouraged to bring glucose readings with him to next PCP visit  Patient Self Care Activities:  . Patient will check blood glucose BID, document, and provide at future  appointments . Patient will take medications as prescribed . Patient will report any questions or concerns to provider   Please see past updates related to this goal by clicking on the "Past Updates" button in the selected goal          The patient verbalized understanding of instructions provided today and declined a print copy of patient instruction materials.   Plan:  - Scheduled f/u call in ~ 12 weeks  Catie Darnelle Maffucci, PharmD, [place] [phone]

## 2019-12-11 ENCOUNTER — Other Ambulatory Visit: Payer: Self-pay | Admitting: Nurse Practitioner

## 2019-12-11 NOTE — Telephone Encounter (Signed)
Requested Prescriptions  Pending Prescriptions Disp Refills  . TRUE METRIX BLOOD GLUCOSE TEST test strip [Pharmacy Med Name: TRUE METRIX SELF MONITORING BLOOD GLUCOSE STRIPS   Strip] 200 strip 1    Sig: CHECK BLOOD SUGAR TWICE DAILY     Endocrinology: Diabetes - Testing Supplies Passed - 12/11/2019 11:37 PM      Passed - Valid encounter within last 12 months    Recent Outpatient Visits          1 month ago Uncontrolled type 2 diabetes mellitus with chronic kidney disease ([place])   [place], Jolene T, NP   2 months ago Uncontrolled type 2 diabetes mellitus with chronic kidney disease ([place])   [place], Jolene T, NP   3 months ago Uncontrolled type 2 diabetes mellitus with chronic kidney disease ([place])   [place], Jolene T, NP   4 months ago Uncontrolled type 2 diabetes mellitus with chronic kidney disease ([place])   [place] Cannady, Barbaraann Faster, NP   5 months ago Dark stools   [employer], Boley T, NP      Future Appointments            In 1 month Cannady, Barbaraann Faster, NP [employer]   In 27 months  [employer]

## 2019-12-27 ENCOUNTER — Other Ambulatory Visit: Payer: Self-pay | Admitting: Nurse Practitioner

## 2020-01-03 ENCOUNTER — Other Ambulatory Visit: Payer: Self-pay | Admitting: Nurse Practitioner

## 2020-01-17 ENCOUNTER — Other Ambulatory Visit: Payer: Self-pay | Admitting: Nurse Practitioner

## 2020-01-24 ENCOUNTER — Ambulatory Visit: Payer: Medicare HMO | Admitting: Nurse Practitioner

## 2020-01-26 ENCOUNTER — Ambulatory Visit (INDEPENDENT_AMBULATORY_CARE_PROVIDER_SITE_OTHER): Payer: Medicare HMO | Admitting: Nurse Practitioner

## 2020-01-26 ENCOUNTER — Other Ambulatory Visit: Payer: Self-pay

## 2020-01-26 ENCOUNTER — Encounter: Payer: Self-pay | Admitting: Nurse Practitioner

## 2020-01-26 VITALS — BP 96/54 | HR 67 | Temp 98.2°F | Wt 220.2 lb

## 2020-01-26 DIAGNOSIS — Z794 Long term (current) use of insulin: Secondary | ICD-10-CM

## 2020-01-26 DIAGNOSIS — E1165 Type 2 diabetes mellitus with hyperglycemia: Secondary | ICD-10-CM

## 2020-01-26 DIAGNOSIS — I700 Atherosclerosis of aorta: Secondary | ICD-10-CM | POA: Diagnosis not present

## 2020-01-26 DIAGNOSIS — E785 Hyperlipidemia, unspecified: Secondary | ICD-10-CM

## 2020-01-26 DIAGNOSIS — C61 Malignant neoplasm of prostate: Secondary | ICD-10-CM | POA: Diagnosis not present

## 2020-01-26 DIAGNOSIS — IMO0002 Reserved for concepts with insufficient information to code with codable children: Secondary | ICD-10-CM

## 2020-01-26 DIAGNOSIS — E1122 Type 2 diabetes mellitus with diabetic chronic kidney disease: Secondary | ICD-10-CM

## 2020-01-26 DIAGNOSIS — E538 Deficiency of other specified B group vitamins: Secondary | ICD-10-CM | POA: Diagnosis not present

## 2020-01-26 DIAGNOSIS — E1169 Type 2 diabetes mellitus with other specified complication: Secondary | ICD-10-CM

## 2020-01-26 DIAGNOSIS — N1831 Chronic kidney disease, stage 3a: Secondary | ICD-10-CM

## 2020-01-26 DIAGNOSIS — I1310 Hypertensive heart and chronic kidney disease without heart failure, with stage 1 through stage 4 chronic kidney disease, or unspecified chronic kidney disease: Secondary | ICD-10-CM | POA: Diagnosis not present

## 2020-01-26 LAB — BAYER DCA HB A1C WAIVED: HB A1C (BAYER DCA - WAIVED): 10.5 % — ABNORMAL HIGH (ref <7.0)

## 2020-01-26 MED ORDER — LISINOPRIL 5 MG PO TABS
5.0000 mg | ORAL_TABLET | Freq: Every day | ORAL | 4 refills | Status: DC
Start: 2020-01-26 — End: 2020-08-26

## 2020-01-26 NOTE — Assessment & Plan Note (Signed)
Followed by [facility] urology, continue to discuss with patient and attempt to obtain [facility] records for review.

## 2020-01-26 NOTE — Assessment & Plan Note (Signed)
New use, refer to diabetes plan of care for dosing.  Continue daily insulin. 

## 2020-01-26 NOTE — Assessment & Plan Note (Signed)
Chronic, ongoing.  A1C 10.5% today, continues to be elevated although does not match with reported home BS, with urine ALB 30 and A:C <30 at last visit.  Will continue Lantus 30 units daily (recommended he take this dose daily) and Metformin 500 MG BID( can not tolerate more than this).  Continue Humalog with meals, check BS consistently with each meal, instructed him NOT to take if does not eat a meal.  On review [facility] does have Freestyle coverage, provided him information from [place] web site on this to present to Dr. Meyer Russel.  Will see if he can obtain this from [place] for better blood sugar monitoring, then will adjust insulin from there.  He sees Dr. Meyer Russel in September, will plan on seeing him in office after this.  Return in 6 weeks.

## 2020-01-26 NOTE — Assessment & Plan Note (Signed)
Chronic, stable with no decline on recent labs. BMP today.  Urine ALB 30 and A:C <30 last visit.  Continue Lisinopril for kidney protection.  Consider nephrology in future if any decline noted.

## 2020-01-26 NOTE — Assessment & Plan Note (Signed)
Ongoing, noted on lung CT in 2019.  Recommend continued use of statin and ASA daily + avoid use of nicotine products.

## 2020-01-26 NOTE — Assessment & Plan Note (Signed)
Chronic, stable with BP below goal in office, on lower side, and on lower side occasionally at home.  Will reduce Lisinopril to 5 MG daily at this time.  Urine ALB 30 and A:C <30 last visit, Lisinopril for kidney protection.  Recommend he continue to monitor BP regularly at home and document for provider.  BMP and TSH today.  Return to office in 3 months.

## 2020-01-26 NOTE — Assessment & Plan Note (Signed)
Ongoing, recheck B12 level today and continue supplement.

## 2020-01-26 NOTE — Assessment & Plan Note (Signed)
Chronic, ongoing.  Continue current medication regimen and adjust as needed. Lipid panel today. 

## 2020-01-26 NOTE — Patient Instructions (Signed)

## 2020-01-26 NOTE — Progress Notes (Signed)
BP (!) 96/54 (BP Location: Left Arm, Cuff Size: Small)   Pulse 67   Temp 98.2 F (36.8 C) (Oral)   Wt 220 lb 3.2 oz (99.9 kg)   SpO2 95%   BMI 34.49 kg/m    Subjective:    Patient ID: [name], male    DOB: 07-17-1940, 79 y.o.   MRN: [id number]  HPI: Jamicah Anstead. is a 79 y.o. male  Chief Complaint  Patient presents with  . Diabetes  . Hyperlipidemia  . Hypertension    DIABETES Last A1C 10% in May.  Currently is taking Lantus 30 units, Humalog 10 units with meals (takes 5 units for breakfast), and Metformin 500 MG BID. Stopped taking Victoza due to cost -- the [facility] wanted to [facility] Ozempic but this caused GI issues in past. Continues to see PCP at [facility] as well.  Working with CCM team in office. He reports feeling "a whole lot better" with medication changes.  He is checking BS 4 times a day at this time.  Has not been using Freestyle Libre due to cost of $60.  Dr. Meyer Russel is provider at [facility] [phone]. Hypoglycemic episodes:no Polydipsia/polyuria: no Visual disturbance: no Chest pain: no Paresthesias: no Glucose Monitoring: yes             Accucheck frequency: Daily             Fasting glucose: 124 this morning -- 115-125 with 30 units Lantus             Post prandial: 110-130 range             Evening: 150's             Before meals: breakfast 120 -140, lunch 130 range, and dinner 130-140 Taking Insulin?: yes             Long acting insulin: Lantus 30 units             Short acting insulin: 5 units with breakfast and 10 units with lunch and dinner Blood Pressure Monitoring: daily Retinal Examination: Not Up to Date -- [facility] in October 2019 Foot Exam: Up to Date Pneumovax: Up to Date Influenza: Up to Date Aspirin: yes   HYPERTENSION / HYPERLIPIDEMIA Continues on Lisinopril 10 MG and Lipitor 40 MG.  Recent CRT 1.26 and GFR 54.  Discussed his blood pressure being on lower side, reports he stays well hydrated at  home. Satisfied with current treatment? yes Duration of hypertension: chronic BP monitoring frequency: daily BP range: 110-130/55-70 BP medication side effects: no Duration of hyperlipidemia: chronic Cholesterol medication side effects: no Cholesterol supplements: none Medication compliance: good compliance Aspirin: yes Recent stressors: no Recurrent headaches: no Visual changes: no Palpitations: no Dyspnea: no Chest pain: no Lower extremity edema: no Dizzy/lightheaded: no   PROSTATE CANCER: Is followed at the [place] in [place] and has blood draws every 60 days.  He states they told him "too old for surgery".   He reports he lost his sister, mother, father, and brother to cancer.  States he continues to see [place] providers for this.  Denies any current symptoms.  Continues on Tamsulosin.    Relevant past medical, surgical, family and social history reviewed and updated as indicated. Interim medical history since our last visit reviewed. Allergies and medications reviewed and updated.  Review of Systems  Constitutional: Negative for activity change, appetite change, diaphoresis, fatigue and fever.  Respiratory: Negative for  cough, chest tightness and shortness of breath.   Cardiovascular: Negative for chest pain, palpitations and leg swelling.  Gastrointestinal: Negative.   Endocrine: Negative for cold intolerance, heat intolerance, polydipsia, polyphagia and polyuria.  Neurological: Negative.   Psychiatric/Behavioral: Negative.     Per HPI unless specifically indicated above     Objective:    BP (!) 96/54 (BP Location: Left Arm, Cuff Size: Small)   Pulse 67   Temp 98.2 F (36.8 C) (Oral)   Wt 220 lb 3.2 oz (99.9 kg)   SpO2 95%   BMI 34.49 kg/m   Wt Readings from Last 3 Encounters:  01/26/20 220 lb 3.2 oz (99.9 kg)  10/20/19 222 lb (100.7 kg)  09/15/19 220 lb (99.8 kg)    Physical Exam Vitals and nursing note reviewed.  Constitutional:      General: He is not in acute  distress.    Appearance: He is well-developed and overweight. He is not ill-appearing.  HENT:     Head: Normocephalic and atraumatic.     Right Ear: Hearing normal. No drainage.     Left Ear: Hearing normal. No drainage.     Mouth/Throat:     Pharynx: Uvula midline.  Eyes:     General: Lids are normal.        Right eye: No discharge.        Left eye: No discharge.     Conjunctiva/sclera: Conjunctivae normal.     Pupils: Pupils are equal, round, and reactive to light.  Neck:     Thyroid: No thyromegaly.     Vascular: No carotid bruit.  Cardiovascular:     Rate and Rhythm: Normal rate and regular rhythm.     Heart sounds: Normal heart sounds, S1 normal and S2 normal. No murmur heard.  No gallop.   Pulmonary:     Effort: Pulmonary effort is normal. No accessory muscle usage or respiratory distress.     Breath sounds: Normal breath sounds.  Abdominal:     General: Bowel sounds are normal.     Palpations: Abdomen is soft.  Musculoskeletal:        General: Normal range of motion.     Cervical back: Normal range of motion and neck supple.     Right lower leg: No edema.     Left lower leg: No edema.  Skin:    General: Skin is warm and dry.     Capillary Refill: Capillary refill takes less than 2 seconds.  Neurological:     Mental Status: He is alert and oriented to person, place, and time.  Psychiatric:        Attention and Perception: Attention normal.        Mood and Affect: Mood normal.        Speech: Speech normal.        Behavior: Behavior normal.        Thought Content: Thought content normal.    Results for orders placed or performed in visit on 10/20/19  Bayer DCA Hb A1c Waived  Result Value Ref Range   HB A1C (BAYER DCA - WAIVED) 10.0 (H) <7.0 %  Lipid Panel w/o Chol/HDL Ratio  Result Value Ref Range   Cholesterol, Total 182 100 - 199 mg/dL   Triglycerides 283 (H) 0 - 149 mg/dL   HDL 43 >39 mg/dL   VLDL Cholesterol Cal 47 (H) 5 - 40 mg/dL   LDL Chol Calc (NIH)  92 0 - 99 mg/dL  Assessment & Plan:   Problem List Items Addressed This Visit      Cardiovascular and Mediastinum   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[place])    Chronic, stable with BP below goal in office, on lower side, and on lower side occasionally at home.  Will reduce Lisinopril to 5 MG daily at this time.  Urine ALB 30 and A:C <30 last visit, Lisinopril for kidney protection.  Recommend he continue to monitor BP regularly at home and document for provider.  BMP and TSH today.  Return to office in 3 months.      Relevant Medications   lisinopril (ZESTRIL) 5 MG tablet   Other Relevant Orders   Basic metabolic panel   TSH   Atherosclerosis of aorta ([place])    Ongoing, noted on lung CT in 2019.  Recommend continued use of statin and ASA daily + avoid use of nicotine products.      Relevant Medications   lisinopril (ZESTRIL) 5 MG tablet     Endocrine   Hyperlipidemia associated with type 2 diabetes mellitus (HCC)    Chronic, ongoing.  Continue current medication regimen and adjust as needed.  Lipid panel today.      Relevant Medications   lisinopril (ZESTRIL) 5 MG tablet   Other Relevant Orders   Bayer DCA Hb A1c Waived   Lipid Panel w/o Chol/HDL Ratio   Uncontrolled type 2 diabetes mellitus with chronic kidney disease ([place]) - Primary    Chronic, ongoing.  A1C 10.5% today, continues to be elevated although does not match with reported home BS, with urine ALB 30 and A:C <30 at last visit.  Will continue Lantus 30 units daily (recommended he take this dose daily) and Metformin 500 MG BID( can not tolerate more than this).  Continue Humalog with meals, check BS consistently with each meal, instructed him NOT to take if does not eat a meal.  On review [facility] does have Freestyle coverage, provided him information from [place] web site on this to present to Dr. Meyer Russel.  Will see if he can obtain this from [place] for better blood sugar monitoring, then will adjust  insulin from there.  He sees Dr. Meyer Russel in September, will plan on seeing him in office after this.  Return in 6 weeks.      Relevant Medications   lisinopril (ZESTRIL) 5 MG tablet   Other Relevant Orders   Bayer DCA Hb A1c Waived     Genitourinary   CKD (chronic kidney disease), stage III (HCC)    Chronic, stable with no decline on recent labs. BMP today.  Urine ALB 30 and A:C <30 last visit.  Continue Lisinopril for kidney protection.  Consider nephrology in future if any decline noted.      Prostate cancer [facility])    Followed by [facility] urology, continue to discuss with patient and attempt to obtain [place] records for review.        Other   Vitamin B12 deficiency    Ongoing, recheck B12 level today and continue supplement.      Relevant Orders   Vitamin B12   Long-term insulin use ([place])    New use, refer to diabetes plan of care for dosing.  Continue daily insulin.          Follow up plan: Return in about 6 weeks (around 03/08/2020) for T2DM.

## 2020-01-27 LAB — BASIC METABOLIC PANEL
BUN/Creatinine Ratio: 12 (ref 10–24)
BUN: 9 mg/dL (ref 8–27)
CO2: 24 mmol/L (ref 20–29)
Calcium: 10 mg/dL (ref 8.6–10.2)
Chloride: 100 mmol/L (ref 96–106)
Creatinine, Ser: 0.77 mg/dL (ref 0.76–1.27)
GFR calc Af Amer: 100 mL/min/{1.73_m2} (ref >59)
GFR calc non Af Amer: 87 mL/min/{1.73_m2} (ref >59)
Glucose: 95 mg/dL (ref 65–99)
Potassium: 3.9 mmol/L (ref 3.5–5.2)
Sodium: 142 mmol/L (ref 134–144)

## 2020-01-27 LAB — LIPID PANEL W/O CHOL/HDL RATIO
Cholesterol, Total: 192 mg/dL (ref 100–199)
HDL: 37 mg/dL — ABNORMAL LOW (ref >39)
LDL Chol Calc (NIH): 118 mg/dL — ABNORMAL HIGH (ref 0–99)
Triglycerides: 207 mg/dL — ABNORMAL HIGH (ref 0–149)
VLDL Cholesterol Cal: 37 mg/dL (ref 5–40)

## 2020-01-27 LAB — TSH: TSH: 0.33 u[IU]/mL — ABNORMAL LOW (ref 0.450–4.500)

## 2020-01-27 LAB — VITAMIN B12: Vitamin B-12: 335 pg/mL (ref 232–1245)

## 2020-01-28 ENCOUNTER — Other Ambulatory Visit: Payer: Self-pay | Admitting: Nurse Practitioner

## 2020-01-28 DIAGNOSIS — E1169 Type 2 diabetes mellitus with other specified complication: Secondary | ICD-10-CM

## 2020-01-28 DIAGNOSIS — R7989 Other specified abnormal findings of blood chemistry: Secondary | ICD-10-CM

## 2020-01-28 DIAGNOSIS — E785 Hyperlipidemia, unspecified: Secondary | ICD-10-CM

## 2020-01-28 NOTE — Progress Notes (Signed)
Please let Scott Lawson know his labs have returned: - Kidney function remains stable and glucose on these labs looks better. - B12 remains on low side, I want him to ensure he takes over the counter Vitamin B12 1000 MCG daily. - Thyroid level was low on these labs, more hyperthyroid, I would like him to come in for outpatient labs in 6 weeks to recheck this.  Please schedule. - Cholesterol levels remain above goal.  I would like to check these fasting next visit as you are on max dose Atorvastatin.  Any questions?  Have a great day!!

## 2020-02-06 ENCOUNTER — Telehealth: Payer: Self-pay | Admitting: *Deleted

## 2020-02-06 NOTE — Chronic Care Management (AMB) (Signed)
  Chronic Care Management   Note  02/06/2020 Name: Scott Lawson. MRN: [id number] DOB: 08-Jan-1941  [name] is a 79 y.o. year old male who is a primary care patient of Venita Lick, NP and is actively engaged with the care management team. I reached out to [name] by phone today to assist with re-scheduling a follow up visit with the Pharmacist.  Follow up plan: Unsuccessful telephone outreach attempt made. The care management team will reach out to the patient again over the next 7 days. If patient returns call to provider office, please advise to call [place] at [phone].  [place] Management

## 2020-02-13 NOTE — Chronic Care Management (AMB) (Signed)
  Chronic Care Management   Note  02/13/2020 Name: Scott Lawson. MRN: [id number] DOB: [birth year]  [name] is a 79 y.o. year old male who is a primary care patient of Venita Lick, NP and is actively engaged with the care management team. I reached out to [name] by phone today to assist with re-scheduling a follow up visit with the Pharmacist.  Follow up plan: Telephone appointment with care management team member scheduled for: 03/20/2020  [place] Management

## 2020-02-13 NOTE — Chronic Care Management (AMB) (Signed)
  Chronic Care Management   Note  02/13/2020 Name: Scott Lawson. MRN: [id number] DOB: 1941-04-21  Scott Lawson. is a 79 y.o. year old male who is a primary care patient of Venita Lick, NP and is actively engaged with the care management team. I reached out to [name] by phone today to assist with re-scheduling a follow up visit with the Pharmacist.  Follow up plan: Unsuccessful telephone outreach attempt made. The care management team will reach out to the patient again over the next 7 days. If patient returns call to provider office, please advise to call [place] at [phone].  [place] Management

## 2020-02-27 ENCOUNTER — Telehealth: Payer: Self-pay

## 2020-03-08 ENCOUNTER — Telehealth: Payer: Self-pay | Admitting: Nurse Practitioner

## 2020-03-08 NOTE — Telephone Encounter (Signed)
called to r/s 9/27 appt, no answer, vm not set up

## 2020-03-11 ENCOUNTER — Ambulatory Visit: Payer: Medicare HMO | Admitting: Nurse Practitioner

## 2020-03-11 ENCOUNTER — Other Ambulatory Visit: Payer: Self-pay | Admitting: Nurse Practitioner

## 2020-03-11 NOTE — Telephone Encounter (Signed)
Requested Prescriptions  Pending Prescriptions Disp Refills  . tamsulosin (FLOMAX) 0.4 MG CAPS capsule [Pharmacy Med Name: TAMSULOSIN HYDROCHLORIDE 0.4 MG Capsule] 90 capsule 0    Sig: TAKE [place]     Urology: Alpha-Adrenergic Blocker Passed - 03/11/2020  1:57 PM      Passed - Last BP in normal range    BP Readings from Last 1 Encounters:  01/26/20 (!) 96/54         Passed - Valid encounter within last 12 months    Recent Outpatient Visits          1 month ago Uncontrolled type 2 diabetes mellitus with chronic kidney disease ([place])   [place] Cannady, Jolene T, NP   4 months ago Uncontrolled type 2 diabetes mellitus with chronic kidney disease ([place])   [place], Jolene T, NP   5 months ago Uncontrolled type 2 diabetes mellitus with chronic kidney disease ([place])   [place], Jolene T, NP   6 months ago Uncontrolled type 2 diabetes mellitus with chronic kidney disease ([place])   [place], Jolene T, NP   7 months ago Uncontrolled type 2 diabetes mellitus with chronic kidney disease ([place])   [place], Barbaraann Faster, NP      Future Appointments            In 1 week Cannady, Barbaraann Faster, NP [facility]   In 8 months  [place]           . metFORMIN (GLUCOPHAGE) 500 MG tablet [Pharmacy Med Name: METFORMIN HYDROCHLORIDE 500 MG Tablet] 180 tablet 1    Sig: TAKE 1 TABLET TWICE DAILY WITH FOOD     Endocrinology:  Diabetes - Biguanides Failed - 03/11/2020  1:57 PM      Failed - HBA1C is between 0 and 7.9 and within 180 days    Hemoglobin A1C  Date Value Ref Range Status  03/03/2016 7.1%  Final   HB A1C (BAYER DCA - WAIVED)  Date Value Ref Range Status  01/26/2020 10.5 (H) <7.0 % Final    Comment:                                          Diabetic Adult            <7.0                                       Healthy Adult        4.3 -  5.7                                                           (DCCT/NGSP) American Diabetes Association's Summary of Glycemic Recommendations for Adults with Diabetes: Hemoglobin A1c <7.0%. More stringent glycemic goals (A1c <6.0%) may further reduce complications at the cost of increased risk of hypoglycemia.          Passed - Cr in normal range and within 360 days    Creatinine  Date Value Ref Range Status  07/17/2014  1.21 0.60 - 1.30 mg/dL Final   Creatinine, Ser  Date Value Ref Range Status  01/26/2020 0.77 0.76 - 1.27 mg/dL Final         Passed - eGFR in normal range and within 360 days    EGFR (African American)  Date Value Ref Range Status  07/17/2014 >60 >64m/min Final   GFR calc Af Amer  Date Value Ref Range Status  01/26/2020 100 >59 mL/min/1.73 Final    Comment:    **[facility] currently reports eGFR in compliance with the current**   recommendations of the [employer] [facility] will   update reporting as new guidelines are published from the NKF-ASN   Task force.    EGFR (Non-African Amer.)  Date Value Ref Range Status  07/17/2014 >60 >638mmin Final    Comment:    eGFR values <6052min/1.73 m2 may be an indication of chronic kidney disease (CKD). Calculated eGFR, using the MRDR Study equation, is useful in  patients with stable renal function. The eGFR calculation will not be reliable in acutely ill patients when serum creatinine is changing rapidly. It is not useful in patients on dialysis. The eGFR calculation may not be applicable to patients at the low and high extremes of body sizes, pregnant women, and vegetarians.    GFR calc non Af Amer  Date Value Ref Range Status  01/26/2020 87 >59 mL/min/1.73 Final         Passed - Valid encounter within last 6 months    Recent Outpatient Visits          1 month ago Uncontrolled type 2 diabetes mellitus with chronic kidney disease ([facility] Jolene T, NP   4  months ago Uncontrolled type 2 diabetes mellitus with chronic kidney disease ([facility] Cri[facility]ene T, NP   5 months ago Uncontrolled type 2 diabetes mellitus with chronic kidney disease ([facility] C[facility]uf[facility]ene T, NP   6 months ago Uncontrolled type 2 diabetes mellitus with chronic kidney disease ([facility]ene T, NP   7 months ago Uncontrolled type 2 diabetes mellitus with chronic kidney disease ([facility]ok[facility]baraann FasterP      Future Appointments            In 1 week Cannady, [facility]Barbaraann FasterP [facility]   In 8 months  [facility]

## 2020-03-20 ENCOUNTER — Telehealth: Payer: Medicare HMO

## 2020-03-20 NOTE — Chronic Care Management (AMB) (Deleted)
Chronic Care Management   Follow Up Note   03/20/2020 Name: Scott Lawson Scott Lawson. MRN: [id number] DOB: Sep 03, 1940  Referred by: Venita Lick, NP Reason for referral : No chief complaint on file.   Scott Lawson. is a 79 y.o. year old male who is a primary care patient of Cannady, Barbaraann Faster, NP. The CCM team was consulted for assistance with chronic disease management and care coordination needs.    Review of patient status, including review of consultants reports, relevant laboratory and other test results, and collaboration with appropriate care team members and the patient's provider was performed as part of comprehensive patient evaluation and provision of chronic care management services.    SDOH (Social Determinants of Health) assessments performed: {yes/no:20286} See Care Plan activities for detailed interventions related to [facility])     Outpatient Encounter Medications as of 03/20/2020  Medication Sig Note  . aspirin 81 MG tablet Take 81 mg by mouth daily.  07/13/2019: holding  . atorvastatin (LIPITOR) 80 MG tablet Take 1 tablet (80 mg total) by mouth daily.   . cyanocobalamin 1000 MCG tablet Take 1,000 mcg by mouth daily.   . fluticasone (FLONASE) 50 MCG/ACT nasal spray Place 2 sprays into both nostrils daily as needed.    . insulin glargine (LANTUS) 100 UNIT/ML injection Inject 40 Units into the skin every evening. 1800 11/10/2019: 30 units  . insulin lispro (HUMALOG KWIKPEN) 100 UNIT/ML KwikPen Inject 0.1 mLs (10 Units total) into the skin 3 (three) times daily before meals. Continue to check blood sugar after meals.   Marland Kitchen lisinopril (ZESTRIL) 5 MG tablet Take 1 tablet (5 mg total) by mouth daily.   . metFORMIN (GLUCOPHAGE) 500 MG tablet TAKE 1 TABLET TWICE DAILY WITH FOOD   . omeprazole (PRILOSEC) 20 MG capsule TAKE 1 CAPSULE EVERY DAY   . tamsulosin (FLOMAX) 0.4 MG CAPS capsule TAKE 1 CAPSULE EVERY DAY   . TRUE METRIX BLOOD GLUCOSE TEST test strip CHECK BLOOD SUGAR TWICE  DAILY   . TRUEplus Lancets 33G MISC CHECK BLOOD SUGAR TWICE DAILY    No facility-administered encounter medications on file as of 03/20/2020.    Office visit: 8/13/221- Marnee Guarneri NP, - bloodwork, B12 1012mcg daily, d/c lisinopril  Objective:   Goals Addressed   None     Diabetes   A1c goal <7%  Recent Relevant Labs: Lab Results  Component Value Date/Time   HGBA1C 10.5 (H) 01/26/2020 01:10 PM   HGBA1C 10.0 (H) 10/20/2019 10:59 AM   HGBA1C 7.1% 03/03/2016 12:00 AM   HGBA1C 9.7 (H) 07/16/2014 04:40 AM   MICROALBUR 30 (H) 07/21/2019 09:44 AM   MICROALBUR 30 (H) 09/23/2018 02:05 PM    Last diabetic Eye exam:  Lab Results  Component Value Date/Time   HMDIABEYEEXA No Retinopathy 09/27/2017 12:00 AM    Last diabetic Foot exam: No results found for: HMDIABFOOTEX   Checking BG: {CHL HP Blood Glucose Monitoring Frequency:[phone]}  Recent FBG Readings: *** Recent pre-meal BG readings: *** Recent 2hr PP BG readings:  *** Recent HS BG readings: ***  Patient has failed these meds in past: *** Patient is currently {CHL Controlled/[phone]} on the following medications: . ***  We discussed: {CHL HP Upstream Pharmacy discussion:[phone]}  Plan  Continue {CHL HP Upstream Pharmacy Plans:[id number]}  Hypertension CKD III   BP goal is:  {CHL HP UPSTREAM Pharmacist BP [phone]}  Office blood pressures are  BP Readings from Last 3 Encounters:  01/26/20 (!) 96/54  10/20/19 123/72  09/15/19  108/71   Patient checks BP at home {CHL HP BP Monitoring Frequency:[phone]} Patient home BP readings are ranging: ***  Patient has failed these meds in the past: *** Patient is currently {CHL Controlled/[phone]} on the following medications:  . ***  We discussed {CHL HP Upstream Pharmacy discussion:[phone]}  Plan  Continue {CHL HP Upstream Pharmacy Plans:[phone]}      Plan:   {CCM FOLLOW UP PLAN:22241}   SIGNATURE

## 2020-03-22 ENCOUNTER — Ambulatory Visit (INDEPENDENT_AMBULATORY_CARE_PROVIDER_SITE_OTHER): Payer: Medicare HMO | Admitting: Nurse Practitioner

## 2020-03-22 ENCOUNTER — Other Ambulatory Visit: Payer: Self-pay

## 2020-03-22 ENCOUNTER — Encounter: Payer: Self-pay | Admitting: Nurse Practitioner

## 2020-03-22 VITALS — BP 132/77 | HR 76 | Temp 98.2°F | Ht 67.0 in | Wt 225.4 lb

## 2020-03-22 DIAGNOSIS — E1165 Type 2 diabetes mellitus with hyperglycemia: Secondary | ICD-10-CM

## 2020-03-22 DIAGNOSIS — R7989 Other specified abnormal findings of blood chemistry: Secondary | ICD-10-CM | POA: Diagnosis not present

## 2020-03-22 DIAGNOSIS — E1122 Type 2 diabetes mellitus with diabetic chronic kidney disease: Secondary | ICD-10-CM | POA: Diagnosis not present

## 2020-03-22 DIAGNOSIS — IMO0002 Reserved for concepts with insufficient information to code with codable children: Secondary | ICD-10-CM

## 2020-03-22 NOTE — Progress Notes (Signed)
BP 132/77 (BP Location: Left Arm, Patient Position: Sitting, Cuff Size: Normal)    Pulse 76    Temp 98.2 F (36.8 C) (Oral)    Ht 5\' 7"  (1.702 m)    Wt 225 lb 6.4 oz (102.2 kg)    SpO2 98%    BMI 35.30 kg/m    Subjective:    Patient ID: Scott Roof., male    DOB: 22-Nov-1940, 79 y.o.   MRN: [id number]  HPI: Scott Matters. is a 79 y.o. male  Chief Complaint  Patient presents with   Diabetes    DIABETES Last A1C 10.5% in August.  Currently is taking Lantus 30 units, Humalog 10 units with meals (takes 5 units for breakfast), and Metformin 500 MG BID. Stopped taking Victoza due to cost -- [facility] Ozem[facility] but this caused GI issues in past. Continues to see PCP at [facility] as well.  Working with CCM team in office. He reports feeling "a whole lot better" with medication changes.  He is checking BS 4 times a day at this time.  Has not been using Freestyle Libre due to cost of $60.  On review [facility] does have Freestyle coverage, provided him information from [place] web site on this to present to Dr. Meyer Russel -- who he saw in September -- Dr. Meyer Russel placed the paperwork for this, they expect answer on whether can obtain next month.  He did serve in [employer] during [place] era -- was stationed in [place] -- traveled to [place] and reports being around radiation a lot and [employer].   Dr. Meyer Russel is provider at [facility] [phone].   On his last labs in August -- TSH was 0.333, denies any symptoms.   Hypoglycemic episodes:no Polydipsia/polyuria: no Visual disturbance: no Chest pain: no Paresthesias: no Glucose Monitoring: yes             Accucheck frequency: Daily             Fasting glucose: 130 range in morning -- with 30 units Lantus             Post prandial: 225 at times             Evening: 102 to 212             Before meals: breakfast 120 -140, lunch 130 range, and dinner 130-140 Taking Insulin?: yes             Long acting insulin: Lantus 30 units              Short acting insulin: 5 units with breakfast and 10 units with lunch and dinner Blood Pressure Monitoring: daily Retinal Examination: Not Up to Date -- [facility] in October 2019 Foot Exam: Up to Date Pneumovax: Up to Date Influenza: Up to Date Aspirin: yes    Relevant past medical, surgical, family and social history reviewed and updated as indicated. Interim medical history since our last visit reviewed. Allergies and medications reviewed and updated.  Review of Systems  Constitutional: Negative for activity change, appetite change, diaphoresis, fatigue and fever.  Respiratory: Negative for cough, chest tightness and shortness of breath.   Cardiovascular: Negative for chest pain, palpitations and leg swelling.  Gastrointestinal: Negative.   Endocrine: Negative for cold intolerance, heat intolerance, polydipsia, polyphagia and polyuria.  Neurological: Negative.   Psychiatric/Behavioral: Negative.     Per HPI unless specifically indicated above     Objective:  BP 132/77 (BP Location: Left Arm, Patient Position: Sitting, Cuff Size: Normal)    Pulse 76    Temp 98.2 F (36.8 C) (Oral)    Ht 5\' 7"  (1.702 m)    Wt 225 lb 6.4 oz (102.2 kg)    SpO2 98%    BMI 35.30 kg/m   Wt Readings from Last 3 Encounters:  03/22/20 225 lb 6.4 oz (102.2 kg)  01/26/20 220 lb 3.2 oz (99.9 kg)  10/20/19 222 lb (100.7 kg)    Physical Exam Vitals and nursing note reviewed.  Constitutional:      General: He is not in acute distress.    Appearance: He is well-developed and overweight. He is not ill-appearing.  HENT:     Head: Normocephalic and atraumatic.     Right Ear: Hearing normal. No drainage.     Left Ear: Hearing normal. No drainage.     Mouth/Throat:     Pharynx: Uvula midline.  Eyes:     General: Lids are normal.        Right eye: No discharge.        Left eye: No discharge.     Conjunctiva/sclera: Conjunctivae normal.     Pupils: Pupils are equal, round, and  reactive to light.  Neck:     Thyroid: No thyromegaly.     Vascular: No carotid bruit.  Cardiovascular:     Rate and Rhythm: Normal rate and regular rhythm.     Heart sounds: Normal heart sounds, S1 normal and S2 normal. No murmur heard.  No gallop.   Pulmonary:     Effort: Pulmonary effort is normal. No accessory muscle usage or respiratory distress.     Breath sounds: Normal breath sounds.  Abdominal:     General: Bowel sounds are normal.     Palpations: Abdomen is soft.  Musculoskeletal:        General: Normal range of motion.     Cervical back: Normal range of motion and neck supple.     Right lower leg: No edema.     Left lower leg: No edema.  Skin:    General: Skin is warm and dry.     Capillary Refill: Capillary refill takes less than 2 seconds.  Neurological:     Mental Status: He is alert and oriented to person, place, and time.  Psychiatric:        Attention and Perception: Attention normal.        Mood and Affect: Mood normal.        Speech: Speech normal.        Behavior: Behavior normal.        Thought Content: Thought content normal.    Results for orders placed or performed in visit on 01/26/20  Bayer DCA Hb A1c Waived  Result Value Ref Range   HB A1C (BAYER DCA - WAIVED) 10.5 (H) <1.6 %  Basic metabolic panel  Result Value Ref Range   Glucose 95 65 - 99 mg/dL   BUN 9 8 - 27 mg/dL   Creatinine, Ser 0.77 0.76 - 1.27 mg/dL   GFR calc non Af Amer 87 >59 mL/min/1.73   GFR calc Af Amer 100 >59 mL/min/1.73   BUN/Creatinine Ratio 12 10 - 24   Sodium 142 134 - 144 mmol/L   Potassium 3.9 3.5 - 5.2 mmol/L   Chloride 100 96 - 106 mmol/L   CO2 24 20 - 29 mmol/L   Calcium 10.0 8.6 - 10.2 mg/dL  Lipid Panel  w/o Chol/HDL Ratio  Result Value Ref Range   Cholesterol, Total 192 100 - 199 mg/dL   Triglycerides 207 (H) 0 - 149 mg/dL   HDL 37 (L) >39 mg/dL   VLDL Cholesterol Cal 37 5 - 40 mg/dL   LDL Chol Calc (NIH) 118 (H) 0 - 99 mg/dL  TSH  Result Value Ref Range    TSH 0.330 (L) 0.450 - 4.500 uIU/mL  Vitamin B12  Result Value Ref Range   Vitamin B-12 335 232 - 1,245 pg/mL      Assessment & Plan:   Problem List Items Addressed This Visit      Endocrine   Uncontrolled type 2 diabetes mellitus with chronic kidney disease ([place]) - Primary    Chronic, ongoing - Veteran with exposure to [employer] in past.  A1C 10.5% last visit, continues to be elevated although does not match with reported home BS, with urine ALB 30 and A:C <30 at last visit.  Will continue Lantus 30 units daily (recommended he take this dose daily) and Metformin 500 MG BID( can not tolerate more than this).  Continue Humalog with meals, check BS consistently with each meal, instructed him NOT to take if does not eat a meal.  Await on [facility] which he would greatly benefit from, check A1C in 7 weeks and will adjust meal doses if elevations noted at home.  Return in 7 weeks.      Relevant Orders   Basic metabolic panel     Other   Morbid obesity ([place])    BMI 35.30 with T2DM and HTN.  Recommended eating smaller high protein, low fat meals more frequently and exercising 30 mins a day 5 times a week with a goal of 10-15lb weight loss in the next 3 months. Patient voiced their understanding and motivation to adhere to these recommendations.        Other Visit Diagnoses    Low TSH level       Recheck TSH and check Free T4 today.   Relevant Orders   TSH   T4, free       Follow up plan: Return in about 7 weeks (around 05/10/2020) for T2DM, HTN/HLD, CKD, B12, Prostate CA.

## 2020-03-22 NOTE — Assessment & Plan Note (Signed)
BMI 35.30 with T2DM and HTN.  Recommended eating smaller high protein, low fat meals more frequently and exercising 30 mins a day 5 times a week with a goal of 10-15lb weight loss in the next 3 months. Patient voiced their understanding and motivation to adhere to these recommendations.

## 2020-03-22 NOTE — Patient Instructions (Signed)

## 2020-03-22 NOTE — Assessment & Plan Note (Signed)
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  A1C 10.5% last visit, continues to be elevated although does not match with reported home BS, with urine ALB 30 and A:C <30 at last visit.  Will continue Lantus 30 units daily (recommended he take this dose daily) and Metformin 500 MG BID( can not tolerate more than this).  Continue Humalog with meals, check BS consistently with each meal, instructed him NOT to take if does not eat a meal.  Await on [facility] decision on [facility] which he would greatly benefit from, check A1C in 7 weeks and will adjust meal doses if elevations noted at home.  Return in 7 weeks.

## 2020-03-23 LAB — BASIC METABOLIC PANEL
BUN/Creatinine Ratio: 14 (ref 10–24)
BUN: 18 mg/dL (ref 8–27)
CO2: 24 mmol/L (ref 20–29)
Calcium: 9.3 mg/dL (ref 8.6–10.2)
Chloride: 104 mmol/L (ref 96–106)
Creatinine, Ser: 1.26 mg/dL (ref 0.76–1.27)
GFR calc Af Amer: 63 mL/min/{1.73_m2} (ref >59)
GFR calc non Af Amer: 54 mL/min/{1.73_m2} — ABNORMAL LOW (ref >59)
Glucose: 64 mg/dL — ABNORMAL LOW (ref 65–99)
Potassium: 5.5 mmol/L — ABNORMAL HIGH (ref 3.5–5.2)
Sodium: 142 mmol/L (ref 134–144)

## 2020-03-23 LAB — T4, FREE: Free T4: 1.03 ng/dL (ref 0.82–1.77)

## 2020-03-23 LAB — TSH: TSH: 1.39 u[IU]/mL (ref 0.450–4.500)

## 2020-03-24 NOTE — Progress Notes (Signed)
Good morning, please let Scott Lawson know his labs have returned.  Sugar was actually on lower side of these labs at 64, will see what A1C shows upcoming.  There is some mild kidney disease, but no decline.  Will continue to monitor.  Thyroid testing is normal.  Potassium was mildly elevated, recommend cutting back on hih potassium foods like potatoes, dried fruits, spinach, avocado, bananas.  Will recheck next visit.  Any questions? Keep being awesome!!  Thank you for allowing me to participate in your care. Kindest regards, Armonte Tortorella

## 2020-05-17 ENCOUNTER — Ambulatory Visit (INDEPENDENT_AMBULATORY_CARE_PROVIDER_SITE_OTHER): Payer: Medicare HMO | Admitting: Nurse Practitioner

## 2020-05-17 ENCOUNTER — Encounter: Payer: Self-pay | Admitting: Nurse Practitioner

## 2020-05-17 ENCOUNTER — Other Ambulatory Visit: Payer: Self-pay

## 2020-05-17 VITALS — BP 132/68 | HR 67 | Temp 97.7°F | Wt 230.2 lb

## 2020-05-17 DIAGNOSIS — I1310 Hypertensive heart and chronic kidney disease without heart failure, with stage 1 through stage 4 chronic kidney disease, or unspecified chronic kidney disease: Secondary | ICD-10-CM | POA: Diagnosis not present

## 2020-05-17 DIAGNOSIS — E1169 Type 2 diabetes mellitus with other specified complication: Secondary | ICD-10-CM

## 2020-05-17 DIAGNOSIS — Z1159 Encounter for screening for other viral diseases: Secondary | ICD-10-CM | POA: Diagnosis not present

## 2020-05-17 DIAGNOSIS — E1122 Type 2 diabetes mellitus with diabetic chronic kidney disease: Secondary | ICD-10-CM | POA: Diagnosis not present

## 2020-05-17 DIAGNOSIS — Z794 Long term (current) use of insulin: Secondary | ICD-10-CM | POA: Diagnosis not present

## 2020-05-17 DIAGNOSIS — C61 Malignant neoplasm of prostate: Secondary | ICD-10-CM | POA: Diagnosis not present

## 2020-05-17 DIAGNOSIS — N1831 Chronic kidney disease, stage 3a: Secondary | ICD-10-CM

## 2020-05-17 DIAGNOSIS — IMO0002 Reserved for concepts with insufficient information to code with codable children: Secondary | ICD-10-CM

## 2020-05-17 DIAGNOSIS — E785 Hyperlipidemia, unspecified: Secondary | ICD-10-CM | POA: Diagnosis not present

## 2020-05-17 DIAGNOSIS — E1165 Type 2 diabetes mellitus with hyperglycemia: Secondary | ICD-10-CM | POA: Diagnosis not present

## 2020-05-17 LAB — BAYER DCA HB A1C WAIVED: HB A1C (BAYER DCA - WAIVED): 8 % — ABNORMAL HIGH (ref <7.0)

## 2020-05-17 MED ORDER — METFORMIN HCL 500 MG PO TABS
1000.0000 mg | ORAL_TABLET | Freq: Two times a day (BID) | ORAL | 4 refills | Status: DC
Start: 2020-05-17 — End: 2021-12-04

## 2020-05-17 NOTE — Assessment & Plan Note (Signed)
Followed by [facility] urology, continue to discuss with patient and attempt to obtain [facility] records for review.

## 2020-05-17 NOTE — Assessment & Plan Note (Signed)
BMI 36.05 with T2DM, HTN/HLD.  Recommended eating smaller high protein, low fat meals more frequently and exercising 30 mins a day 5 times a week with a goal of 10-15lb weight loss in the next 3 months. Patient voiced their understanding and motivation to adhere to these recommendations.

## 2020-05-17 NOTE — Assessment & Plan Note (Signed)
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  A1C today improving at 8% (last visit >10%), with urine ALB 30 and A:C <30 at last visit.  Will continue Lantus 25 units daily (recommended he take this dose daily) and Metformin 1000 MG BID.  Continue Humalog with meals, check BS consistently with each meal, instructed him NOT to take if does not eat a meal.  Have instructed him to immediately alert provider if BS at home run <60, then will reduce insulin dosing.  Return in 3 months.

## 2020-05-17 NOTE — Assessment & Plan Note (Signed)
Chronic, ongoing.  Continue current medication regimen and adjust as needed.  Lipid panel today as dose increased to high dosing last visit.

## 2020-05-17 NOTE — Assessment & Plan Note (Signed)
New use, refer to diabetes plan of care for dosing.  Continue daily insulin.

## 2020-05-17 NOTE — Assessment & Plan Note (Signed)
Chronic, stable with no decline on recent labs. CMP today.  Urine ALB 30 and A:C <30 last visit.  Continue Lisinopril for kidney protection.  Consider nephrology in future if any decline noted.

## 2020-05-17 NOTE — Patient Instructions (Signed)

## 2020-05-17 NOTE — Progress Notes (Signed)
BP 132/68 (BP Location: Left Arm, Patient Position: Sitting)    Pulse 67    Temp 97.7 F (36.5 C)    Wt 230 lb 3.2 oz (104.4 kg)    SpO2 97%    BMI 36.05 kg/m    Subjective:    Patient ID: Scott Roof., male    DOB: 06-Apr-1941, 79 y.o.   MRN: [id number]  HPI: Scott Rappaport. is a 79 y.o. male  Chief Complaint  Patient presents with   Diabetes   Chronic Kidney Disease    DIABETES Last A1C 10.5% in August.  Currently is taking Lantus 25 units, Humalog 10 units with meals (takes 5 units for breakfast), and Metformin 1000 MG BID. Stopped taking Victoza due to cost -- [facility] but this caused GI issues in past. Continues to see PCP at [facility] as well -- last saw 2 months ago.  Working with CCM team in office. He reports feeling "a whole lot better" with changes.  He is checking BS 4 times a day at this time.  Using new monitor, but no Freestyle. Dr. Meyer Russel is provider at [facility] -- [facility] [phone]. Hypoglycemic episodes:no Polydipsia/polyuria: no Visual disturbance: no Chest pain: no Paresthesias: no Glucose Monitoring: yes             Accucheck frequency: Daily             Fasting glucose: 110-125             Post prandial: 115 to 200 range             Evening: 150 to 200's             Before meals:  Taking Insulin?: yes             Long acting insulin: Lantus 25 units             Short acting insulin: 5 units with breakfast and 10 units with lunch and dinner Blood Pressure Monitoring: daily Retinal Examination: Not Up to Date -- [facility] in October 2019 Foot Exam: Up to Date Pneumovax: Up to Date Influenza: Up to Date Aspirin: yes   HYPERTENSION / HYPERLIPIDEMIA Continues on Lisinopril 5 MG and Lipitor 80 MG (increased last visit).  Recent CRT 1.26 and GFR 54.  LDL in August 118. Satisfied with current treatment? yes Duration of hypertension: chronic BP monitoring frequency: daily BP range: 120-130/60-70 BP  medication side effects: no Duration of hyperlipidemia: chronic Cholesterol medication side effects: no Cholesterol supplements: none Medication compliance: good compliance Aspirin: yes Recent stressors: no Recurrent headaches: no Visual changes: no Palpitations: no Dyspnea: no Chest pain: no Lower extremity edema: no Dizzy/lightheaded: no   PROSTATE CANCER: Is followed at the [place] in [place] and has blood draws every 60 days -- due next in January there.  He states they told him "too old for surgery".   He reports he lost his sister, mother, father, and brother to cancer.  States he continues to see [place] providers for this.  Denies any current symptoms.  Continues on Tamsulosin.    Relevant past medical, surgical, family and social history reviewed and updated as indicated. Interim medical history since our last visit reviewed. Allergies and medications reviewed and updated.  Review of Systems  Constitutional: Negative for activity change, appetite change, diaphoresis, fatigue and fever.  Respiratory: Negative for cough, chest tightness and shortness of breath.   Cardiovascular: Negative for chest  pain, palpitations and leg swelling.  Gastrointestinal: Negative.   Endocrine: Negative for cold intolerance, heat intolerance, polydipsia, polyphagia and polyuria.  Neurological: Negative.   Psychiatric/Behavioral: Negative.    Per HPI unless specifically indicated above     Objective:    BP 132/68 (BP Location: Left Arm, Patient Position: Sitting)    Pulse 67    Temp 97.7 F (36.5 C)    Wt 230 lb 3.2 oz (104.4 kg)    SpO2 97%    BMI 36.05 kg/m   Wt Readings from Last 3 Encounters:  05/17/20 230 lb 3.2 oz (104.4 kg)  03/22/20 225 lb 6.4 oz (102.2 kg)  01/26/20 220 lb 3.2 oz (99.9 kg)    Physical Exam Vitals and nursing note reviewed.  Constitutional:      General: He is not in acute distress.    Appearance: He is well-developed and overweight. He is not ill-appearing.  HENT:      Head: Normocephalic and atraumatic.     Right Ear: Hearing normal. No drainage.     Left Ear: Hearing normal. No drainage.     Mouth/Throat:     Pharynx: Uvula midline.  Eyes:     General: Lids are normal.        Right eye: No discharge.        Left eye: No discharge.     Conjunctiva/sclera: Conjunctivae normal.     Pupils: Pupils are equal, round, and reactive to light.  Neck:     Thyroid: No thyromegaly.     Vascular: No carotid bruit.  Cardiovascular:     Rate and Rhythm: Normal rate and regular rhythm.     Heart sounds: Normal heart sounds, S1 normal and S2 normal.      No murmur heard.     No gallop.   Pulmonary:     Effort: Pulmonary effort is normal. No accessory muscle usage or respiratory distress.     Breath sounds: Normal breath sounds.  Abdominal:     General: Bowel sounds are normal.     Palpations: Abdomen is soft.  Musculoskeletal:        General: Normal range of motion.     Cervical back: Normal range of motion and neck supple.     Right lower leg: No edema.     Left lower leg: No edema.  Skin:    General: Skin is warm and dry.     Capillary Refill: Capillary refill takes less than 2 seconds.  Neurological:     Mental Status: He is alert and oriented to person, place, and    time.  Psychiatric:        Attention and Perception: Attention normal.        Mood and Affect: Mood normal.        Speech: Speech normal.        Behavior: Behavior normal.        Thought Content: Thought content normal.    Results for orders placed or performed in visit on 81/82/99  Basic metabolic panel  Result Value Ref Range   Glucose 64 (L) 65 - 99 mg/dL   BUN 18 8 - 27 mg/dL   Creatinine, Ser 1.26 0.76 - 1.27 mg/dL   GFR calc non Af Amer 54 (L) >59 mL/min/1.73   GFR calc Af Amer 63 >59 mL/min/1.73   BUN/Creatinine Ratio 14 10 - 24   Sodium 142 134 - 144 mmol/L   Potassium 5.5 (H) 3.5 - 5.2 mmol/L  Chloride 104 96 - 106 mmol/L   CO2 24 20 - 29 mmol/L   Calcium 9.3 8.6  - 10.2 mg/dL  TSH  Result Value Ref Range   TSH 1.390 0.450 - 4.500 uIU/mL  T4, free  Result Value Ref Range   Free T4 1.03 0.82 - 1.77 ng/dL      Assessment & Plan:   Problem List Items Addressed This Visit      Cardiovascular and Mediastinum   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HCC)    Chronic, stable with BP below goal in office.  Will continue Lisinopril 5 MG daily at this time, higher doses caused hypotension.  Urine ALB 30 and A:C <30 last visit, Lisinopril for kidney protection.  Recommend he continue to monitor BP regularly at home and document for provider.  CMP today.  Return to office in 3 months.      Relevant Orders   Bayer DCA Hb A1c Waived   Comprehensive metabolic panel     Endocrine   Hyperlipidemia associated with type 2 diabetes mellitus (HCC)    Chronic, ongoing.  Continue current medication regimen and adjust as needed.  Lipid panel today as dose increased to high dosing last visit.      Relevant Medications   metFORMIN (GLUCOPHAGE) 500 MG tablet   Other Relevant Orders   Lipid Panel w/o Chol/HDL Ratio   Uncontrolled type 2 diabetes mellitus with chronic kidney disease ([place]) - Primary    Chronic, ongoing - Veteran with exposure to [employer] in past.  A1C today improving at 8% (last visit >10%), with urine ALB 30 and A:C <30 at last visit.  Will continue Lantus 25 units daily (recommended he take this dose daily) and Metformin 1000 MG BID.  Continue Humalog with meals, check BS consistently with each meal, instructed him NOT to take if does not eat a meal.  Have instructed him to immediately alert provider if BS at home run <60, then will reduce insulin dosing.  Return in 3 months.      Relevant Medications   metFORMIN (GLUCOPHAGE) 500 MG tablet   Other Relevant Orders   Bayer DCA Hb A1c Waived     Genitourinary   CKD (chronic kidney disease), stage III (HCC)    Chronic, stable with no decline on recent labs. CMP  today.  Urine ALB 30 and A:C <30 last visit.  Continue Lisinopril for kidney protection.  Consider nephrology in future if any decline noted.      Prostate cancer [facility])    Followed by [facility] urology, continue to discuss with patient and attempt to obtain [place] records for review.        Other   Long-term insulin use ([place])    New use, refer to diabetes plan of care for dosing.  Continue daily insulin.      Morbid obesity (HCC)    BMI 36.05 with T2DM, HTN/HLD.  Recommended eating smaller high protein, low fat meals more frequently and exercising 30 mins a day 5 times a week with a goal of 10-15lb weight loss in the next 3 months. Patient voiced their understanding and motivation to adhere to these recommendations.       Relevant Medications   metFORMIN (GLUCOPHAGE) 500 MG tablet    Other Visit Diagnoses    Need for hepatitis C screening test       Hep C screening on labs today.   Relevant Orders   Hepatitis C Antibody  Follow up plan: Return in about 3 months (around 08/15/2020) for T2DM, HTN/HLD,CKD.

## 2020-05-17 NOTE — Assessment & Plan Note (Signed)
Chronic, stable with BP below goal in office.  Will continue Lisinopril 5 MG daily at this time, higher doses caused hypotension.  Urine ALB 30 and A:C <30 last visit, Lisinopril for kidney protection.  Recommend he continue to monitor BP regularly at home and document for provider.  CMP today.  Return to office in 3 months.

## 2020-05-18 LAB — COMPREHENSIVE METABOLIC PANEL
ALT: 17 IU/L (ref 0–44)
AST: 17 IU/L (ref 0–40)
Albumin/Globulin Ratio: 1.7 (ref 1.2–2.2)
Albumin: 4.2 g/dL (ref 3.7–4.7)
Alkaline Phosphatase: 72 IU/L (ref 44–121)
BUN/Creatinine Ratio: 12 (ref 10–24)
BUN: 14 mg/dL (ref 8–27)
Bilirubin Total: 0.5 mg/dL (ref 0.0–1.2)
CO2: 22 mmol/L (ref 20–29)
Calcium: 9.3 mg/dL (ref 8.6–10.2)
Chloride: 105 mmol/L (ref 96–106)
Creatinine, Ser: 1.13 mg/dL (ref 0.76–1.27)
GFR calc Af Amer: 72 mL/min/{1.73_m2} (ref >59)
GFR calc non Af Amer: 62 mL/min/{1.73_m2} (ref >59)
Globulin, Total: 2.5 g/dL (ref 1.5–4.5)
Glucose: 44 mg/dL — ABNORMAL LOW (ref 65–99)
Potassium: 4 mmol/L (ref 3.5–5.2)
Sodium: 141 mmol/L (ref 134–144)
Total Protein: 6.7 g/dL (ref 6.0–8.5)

## 2020-05-18 LAB — LIPID PANEL W/O CHOL/HDL RATIO
Cholesterol, Total: 234 mg/dL — ABNORMAL HIGH (ref 100–199)
HDL: 47 mg/dL (ref >39)
LDL Chol Calc (NIH): 152 mg/dL — ABNORMAL HIGH (ref 0–99)
Triglycerides: 194 mg/dL — ABNORMAL HIGH (ref 0–149)
VLDL Cholesterol Cal: 35 mg/dL (ref 5–40)

## 2020-05-18 LAB — HEPATITIS C ANTIBODY: Hep C Virus Ab: <0.1 s/co ratio (ref 0.0–0.9)

## 2020-05-19 NOTE — Progress Notes (Signed)
Good morning, please let Scott Lawson know his labs have returned.  His sugar level was quite low on labs at 44, but I know his A1C still remains slightly above goal.  As we discussed I want him to watch his sugars very closely -- if any blood sugars below 60 he is to let me know immediately and let me know when he notices these -- then we will adjust his insulin and cut back.  I do not want him having sugars below 60.  Kidney function is stable this check.  Cholesterol levels were elevated, was he fasting?  If not continue Atorvastatin and I would like to check fasting next visit.  Any questions?   Keep being awesome!!  Thank you for allowing me to participate in your care. Kindest regards, Yolander Goodie

## 2020-05-20 ENCOUNTER — Other Ambulatory Visit: Payer: Self-pay | Admitting: Nurse Practitioner

## 2020-06-20 ENCOUNTER — Other Ambulatory Visit: Payer: Self-pay

## 2020-06-20 ENCOUNTER — Inpatient Hospital Stay
Admission: EM | Admit: 2020-06-20 | Discharge: 2020-07-19 | DRG: 177 | Disposition: A | Discharge status: Skilled Nursing Facility | Payer: Medicare HMO | Attending: Internal Medicine | Admitting: Internal Medicine

## 2020-06-20 ENCOUNTER — Encounter: Payer: Self-pay | Admitting: *Deleted

## 2020-06-20 ENCOUNTER — Emergency Department: Payer: Medicare HMO

## 2020-06-20 DIAGNOSIS — E781 Pure hyperglyceridemia: Secondary | ICD-10-CM | POA: Diagnosis present

## 2020-06-20 DIAGNOSIS — I2510 Atherosclerotic heart disease of native coronary artery without angina pectoris: Secondary | ICD-10-CM | POA: Diagnosis present

## 2020-06-20 DIAGNOSIS — E1169 Type 2 diabetes mellitus with other specified complication: Secondary | ICD-10-CM | POA: Diagnosis present

## 2020-06-20 DIAGNOSIS — D72829 Elevated white blood cell count, unspecified: Secondary | ICD-10-CM | POA: Diagnosis not present

## 2020-06-20 DIAGNOSIS — R531 Weakness: Secondary | ICD-10-CM | POA: Diagnosis not present

## 2020-06-20 DIAGNOSIS — Z888 Allergy status to other drugs, medicaments and biological substances status: Secondary | ICD-10-CM

## 2020-06-20 DIAGNOSIS — Z7189 Other specified counseling: Secondary | ICD-10-CM | POA: Diagnosis not present

## 2020-06-20 DIAGNOSIS — R7989 Other specified abnormal findings of blood chemistry: Secondary | ICD-10-CM | POA: Diagnosis not present

## 2020-06-20 DIAGNOSIS — Y92230 Patient room in hospital as the place of occurrence of the external cause: Secondary | ICD-10-CM | POA: Diagnosis not present

## 2020-06-20 DIAGNOSIS — N179 Acute kidney failure, unspecified: Secondary | ICD-10-CM

## 2020-06-20 DIAGNOSIS — R0902 Hypoxemia: Secondary | ICD-10-CM | POA: Diagnosis not present

## 2020-06-20 DIAGNOSIS — E1122 Type 2 diabetes mellitus with diabetic chronic kidney disease: Secondary | ICD-10-CM | POA: Diagnosis not present

## 2020-06-20 DIAGNOSIS — I96 Gangrene, not elsewhere classified: Secondary | ICD-10-CM | POA: Diagnosis not present

## 2020-06-20 DIAGNOSIS — R5381 Other malaise: Secondary | ICD-10-CM | POA: Diagnosis not present

## 2020-06-20 DIAGNOSIS — K449 Diaphragmatic hernia without obstruction or gangrene: Secondary | ICD-10-CM | POA: Diagnosis not present

## 2020-06-20 DIAGNOSIS — J9690 Respiratory failure, unspecified, unspecified whether with hypoxia or hypercapnia: Secondary | ICD-10-CM | POA: Diagnosis not present

## 2020-06-20 DIAGNOSIS — J9601 Acute respiratory failure with hypoxia: Secondary | ICD-10-CM | POA: Diagnosis not present

## 2020-06-20 DIAGNOSIS — N189 Chronic kidney disease, unspecified: Secondary | ICD-10-CM

## 2020-06-20 DIAGNOSIS — Z9079 Acquired absence of other genital organ(s): Secondary | ICD-10-CM

## 2020-06-20 DIAGNOSIS — M6281 Muscle weakness (generalized): Secondary | ICD-10-CM | POA: Diagnosis not present

## 2020-06-20 DIAGNOSIS — E11649 Type 2 diabetes mellitus with hypoglycemia without coma: Secondary | ICD-10-CM | POA: Diagnosis not present

## 2020-06-20 DIAGNOSIS — E871 Hypo-osmolality and hyponatremia: Secondary | ICD-10-CM | POA: Diagnosis not present

## 2020-06-20 DIAGNOSIS — E1165 Type 2 diabetes mellitus with hyperglycemia: Secondary | ICD-10-CM | POA: Diagnosis not present

## 2020-06-20 DIAGNOSIS — Z833 Family history of diabetes mellitus: Secondary | ICD-10-CM

## 2020-06-20 DIAGNOSIS — J9600 Acute respiratory failure, unspecified whether with hypoxia or hypercapnia: Secondary | ICD-10-CM | POA: Diagnosis not present

## 2020-06-20 DIAGNOSIS — E875 Hyperkalemia: Secondary | ICD-10-CM | POA: Diagnosis not present

## 2020-06-20 DIAGNOSIS — E669 Obesity, unspecified: Secondary | ICD-10-CM

## 2020-06-20 DIAGNOSIS — E860 Dehydration: Secondary | ICD-10-CM | POA: Diagnosis not present

## 2020-06-20 DIAGNOSIS — I517 Cardiomegaly: Secondary | ICD-10-CM | POA: Diagnosis not present

## 2020-06-20 DIAGNOSIS — R279 Unspecified lack of coordination: Secondary | ICD-10-CM | POA: Diagnosis not present

## 2020-06-20 DIAGNOSIS — R040 Epistaxis: Secondary | ICD-10-CM | POA: Diagnosis not present

## 2020-06-20 DIAGNOSIS — Z794 Long term (current) use of insulin: Secondary | ICD-10-CM | POA: Diagnosis not present

## 2020-06-20 DIAGNOSIS — N170 Acute kidney failure with tubular necrosis: Secondary | ICD-10-CM | POA: Diagnosis not present

## 2020-06-20 DIAGNOSIS — K219 Gastro-esophageal reflux disease without esophagitis: Secondary | ICD-10-CM | POA: Diagnosis present

## 2020-06-20 DIAGNOSIS — J1282 Pneumonia due to coronavirus disease 2019: Secondary | ICD-10-CM | POA: Diagnosis present

## 2020-06-20 DIAGNOSIS — Z6830 Body mass index (BMI) 30.0-30.9, adult: Secondary | ICD-10-CM

## 2020-06-20 DIAGNOSIS — I129 Hypertensive chronic kidney disease with stage 1 through stage 4 chronic kidney disease, or unspecified chronic kidney disease: Secondary | ICD-10-CM | POA: Diagnosis present

## 2020-06-20 DIAGNOSIS — N1831 Chronic kidney disease, stage 3a: Secondary | ICD-10-CM | POA: Diagnosis not present

## 2020-06-20 DIAGNOSIS — S50912A Unspecified superficial injury of left forearm, initial encounter: Secondary | ICD-10-CM | POA: Diagnosis not present

## 2020-06-20 DIAGNOSIS — U071 COVID-19: Principal | ICD-10-CM

## 2020-06-20 DIAGNOSIS — T380X5A Adverse effect of glucocorticoids and synthetic analogues, initial encounter: Secondary | ICD-10-CM | POA: Diagnosis not present

## 2020-06-20 DIAGNOSIS — Z515 Encounter for palliative care: Secondary | ICD-10-CM | POA: Diagnosis not present

## 2020-06-20 DIAGNOSIS — K279 Peptic ulcer, site unspecified, unspecified as acute or chronic, without hemorrhage or perforation: Secondary | ICD-10-CM | POA: Diagnosis present

## 2020-06-20 DIAGNOSIS — R0602 Shortness of breath: Secondary | ICD-10-CM

## 2020-06-20 DIAGNOSIS — E785 Hyperlipidemia, unspecified: Secondary | ICD-10-CM | POA: Diagnosis present

## 2020-06-20 DIAGNOSIS — N183 Chronic kidney disease, stage 3 unspecified: Secondary | ICD-10-CM | POA: Diagnosis not present

## 2020-06-20 DIAGNOSIS — I5021 Acute systolic (congestive) heart failure: Secondary | ICD-10-CM | POA: Diagnosis not present

## 2020-06-20 DIAGNOSIS — Z7984 Long term (current) use of oral hypoglycemic drugs: Secondary | ICD-10-CM

## 2020-06-20 DIAGNOSIS — J439 Emphysema, unspecified: Secondary | ICD-10-CM | POA: Diagnosis not present

## 2020-06-20 DIAGNOSIS — N400 Enlarged prostate without lower urinary tract symptoms: Secondary | ICD-10-CM | POA: Diagnosis present

## 2020-06-20 DIAGNOSIS — Z79899 Other long term (current) drug therapy: Secondary | ICD-10-CM

## 2020-06-20 DIAGNOSIS — Z7982 Long term (current) use of aspirin: Secondary | ICD-10-CM

## 2020-06-20 DIAGNOSIS — Z8249 Family history of ischemic heart disease and other diseases of the circulatory system: Secondary | ICD-10-CM

## 2020-06-20 DIAGNOSIS — N19 Unspecified kidney failure: Secondary | ICD-10-CM | POA: Diagnosis not present

## 2020-06-20 DIAGNOSIS — L988 Other specified disorders of the skin and subcutaneous tissue: Secondary | ICD-10-CM | POA: Diagnosis not present

## 2020-06-20 DIAGNOSIS — I959 Hypotension, unspecified: Secondary | ICD-10-CM | POA: Diagnosis not present

## 2020-06-20 DIAGNOSIS — J9611 Chronic respiratory failure with hypoxia: Secondary | ICD-10-CM

## 2020-06-20 LAB — BASIC METABOLIC PANEL
Anion gap: 15 (ref 5–15)
BUN: 32 mg/dL — ABNORMAL HIGH (ref 8–23)
CO2: 24 mmol/L (ref 22–32)
Calcium: 8.4 mg/dL — ABNORMAL LOW (ref 8.9–10.3)
Chloride: 98 mmol/L (ref 98–111)
Creatinine, Ser: 1.69 mg/dL — ABNORMAL HIGH (ref 0.61–1.24)
GFR, Estimated: 41 mL/min — ABNORMAL LOW (ref >60)
Glucose, Bld: 365 mg/dL — ABNORMAL HIGH (ref 70–99)
Potassium: 4.2 mmol/L (ref 3.5–5.1)
Sodium: 137 mmol/L (ref 135–145)

## 2020-06-20 LAB — CBC
HCT: 43.3 % (ref 39.0–52.0)
Hemoglobin: 14 g/dL (ref 13.0–17.0)
MCH: 29.2 pg (ref 26.0–34.0)
MCHC: 32.3 g/dL (ref 30.0–36.0)
MCV: 90.4 fL (ref 80.0–100.0)
Platelets: 252 10*3/uL (ref 150–400)
RBC: 4.79 MIL/uL (ref 4.22–5.81)
RDW: 13 % (ref 11.5–15.5)
WBC: 6.8 10*3/uL (ref 4.0–10.5)
nRBC: 0.3 % — ABNORMAL HIGH (ref 0.0–0.2)

## 2020-06-20 LAB — PROCALCITONIN: Procalcitonin: <0.1 ng/mL

## 2020-06-20 LAB — TROPONIN I (HIGH SENSITIVITY): Troponin I (High Sensitivity): 61 ng/L — ABNORMAL HIGH (ref <18)

## 2020-06-20 LAB — CBG MONITORING, ED: Glucose-Capillary: 362 mg/dL — ABNORMAL HIGH (ref 70–99)

## 2020-06-20 LAB — BRAIN NATRIURETIC PEPTIDE: B Natriuretic Peptide: 168.9 pg/mL — ABNORMAL HIGH (ref 0.0–100.0)

## 2020-06-20 LAB — POC SARS CORONAVIRUS 2 AG -  ED: SARS Coronavirus 2 Ag: POSITIVE — AB

## 2020-06-20 MED ORDER — ENOXAPARIN SODIUM 60 MG/0.6ML ~~LOC~~ SOLN
0.5000 mg/kg | SUBCUTANEOUS | Status: DC
  Administered 2020-06-20 – 2020-06-28 (×9): 52.5 mg via SUBCUTANEOUS
  Filled 2020-06-20 (×10): qty 0.6

## 2020-06-20 MED ORDER — ASPIRIN EC 81 MG PO TBEC
81.0000 mg | DELAYED_RELEASE_TABLET | Freq: Every day | ORAL | Status: DC
  Administered 2020-06-21 – 2020-07-19 (×29): 81 mg via ORAL
  Filled 2020-06-20 (×29): qty 1

## 2020-06-20 MED ORDER — SODIUM CHLORIDE 0.9 % IV BOLUS
1000.0000 mL | Freq: Once | INTRAVENOUS | Status: AC
  Administered 2020-06-20: 1000 mL via INTRAVENOUS

## 2020-06-20 MED ORDER — ONDANSETRON HCL 4 MG PO TABS
4.0000 mg | ORAL_TABLET | Freq: Four times a day (QID) | ORAL | Status: DC | PRN
  Administered 2020-07-05: 4 mg via ORAL
  Filled 2020-06-20: qty 1

## 2020-06-20 MED ORDER — SODIUM CHLORIDE 0.9 % IV SOLN: 100.0000 mg | Freq: Every day | INTRAVENOUS | Status: DC

## 2020-06-20 MED ORDER — INSULIN ASPART 100 UNIT/ML ~~LOC~~ SOLN
0.0000 [IU] | Freq: Three times a day (TID) | SUBCUTANEOUS | Status: DC
  Administered 2020-06-21: 20 [IU] via SUBCUTANEOUS
  Administered 2020-06-21: 7 [IU] via SUBCUTANEOUS
  Administered 2020-06-22: 4 [IU] via SUBCUTANEOUS
  Administered 2020-06-22 (×2): 3 [IU] via SUBCUTANEOUS
  Administered 2020-06-23: 2 [IU] via SUBCUTANEOUS
  Administered 2020-06-24: 4 [IU] via SUBCUTANEOUS
  Administered 2020-06-26: 20 [IU] via SUBCUTANEOUS
  Administered 2020-06-26: 7 [IU] via SUBCUTANEOUS
  Filled 2020-06-20 (×9): qty 1

## 2020-06-20 MED ORDER — ZINC SULFATE 220 (50 ZN) MG PO CAPS
220.0000 mg | ORAL_CAPSULE | Freq: Every day | ORAL | Status: DC
  Administered 2020-06-21 – 2020-07-19 (×29): 220 mg via ORAL
  Filled 2020-06-20 (×28): qty 1

## 2020-06-20 MED ORDER — INSULIN ASPART 100 UNIT/ML ~~LOC~~ SOLN
6.0000 [IU] | Freq: Three times a day (TID) | SUBCUTANEOUS | Status: DC
  Administered 2020-06-21 (×2): 6 [IU] via SUBCUTANEOUS
  Filled 2020-06-20 (×2): qty 1

## 2020-06-20 MED ORDER — ENOXAPARIN SODIUM 40 MG/0.4ML ~~LOC~~ SOLN: 40.0000 mg | SUBCUTANEOUS | Status: DC

## 2020-06-20 MED ORDER — ASCORBIC ACID 500 MG PO TABS
500.0000 mg | ORAL_TABLET | Freq: Every day | ORAL | Status: DC
  Administered 2020-06-21 – 2020-07-19 (×29): 500 mg via ORAL
  Filled 2020-06-20 (×29): qty 1

## 2020-06-20 MED ORDER — PREDNISONE 50 MG PO TABS
50.0000 mg | ORAL_TABLET | Freq: Every day | ORAL | Status: DC
  Administered 2020-06-24 – 2020-07-03 (×10): 50 mg via ORAL
  Filled 2020-06-20: qty 5
  Filled 2020-06-20: qty 1
  Filled 2020-06-20: qty 5
  Filled 2020-06-20: qty 1
  Filled 2020-06-20 (×2): qty 5
  Filled 2020-06-20 (×3): qty 1
  Filled 2020-06-20: qty 5

## 2020-06-20 MED ORDER — TAMSULOSIN HCL 0.4 MG PO CAPS
0.4000 mg | ORAL_CAPSULE | Freq: Every day | ORAL | Status: DC
Start: 2020-06-21 — End: 2020-07-19
  Administered 2020-06-21 – 2020-07-19 (×29): 0.4 mg via ORAL
  Filled 2020-06-20 (×29): qty 1

## 2020-06-20 MED ORDER — SODIUM CHLORIDE 0.9 % IV SOLN
100.0000 mg | Freq: Every day | INTRAVENOUS | Status: AC
  Administered 2020-06-21 – 2020-06-24 (×4): 100 mg via INTRAVENOUS
  Filled 2020-06-20: qty 20
  Filled 2020-06-20: qty 100
  Filled 2020-06-20: qty 20
  Filled 2020-06-20: qty 100
  Filled 2020-06-20: qty 20

## 2020-06-20 MED ORDER — BARICITINIB 2 MG PO TABS
4.0000 mg | ORAL_TABLET | Freq: Every day | ORAL | Status: DC
  Filled 2020-06-20: qty 2

## 2020-06-20 MED ORDER — INSULIN ASPART 100 UNIT/ML ~~LOC~~ SOLN
0.0000 [IU] | Freq: Every day | SUBCUTANEOUS | Status: DC
  Administered 2020-06-20: 5 [IU] via SUBCUTANEOUS
  Administered 2020-06-23 – 2020-06-24 (×2): 2 [IU] via SUBCUTANEOUS
  Filled 2020-06-20 (×3): qty 1

## 2020-06-20 MED ORDER — GUAIFENESIN-DM 100-10 MG/5ML PO SYRP
10.0000 mL | ORAL_SOLUTION | ORAL | Status: DC | PRN
  Administered 2020-06-25 – 2020-07-14 (×13): 10 mL via ORAL
  Filled 2020-06-20 (×14): qty 10

## 2020-06-20 MED ORDER — SODIUM CHLORIDE 0.9 % IV SOLN
200.0000 mg | Freq: Once | INTRAVENOUS | Status: AC
  Administered 2020-06-20: 200 mg via INTRAVENOUS
  Filled 2020-06-20: qty 40

## 2020-06-20 MED ORDER — ONDANSETRON HCL 4 MG/2ML IJ SOLN
4.0000 mg | Freq: Four times a day (QID) | INTRAMUSCULAR | Status: DC | PRN
  Administered 2020-06-26 – 2020-07-06 (×5): 4 mg via INTRAVENOUS
  Filled 2020-06-20 (×5): qty 2

## 2020-06-20 MED ORDER — SODIUM CHLORIDE 0.9 % IV BOLUS
500.0000 mL | Freq: Once | INTRAVENOUS | Status: AC
  Administered 2020-06-20: 500 mL via INTRAVENOUS

## 2020-06-20 MED ORDER — SODIUM CHLORIDE 0.9 % IV SOLN: 200.0000 mg | Freq: Once | INTRAVENOUS | Status: DC

## 2020-06-20 MED ORDER — INSULIN DETEMIR 100 UNIT/ML ~~LOC~~ SOLN
0.1500 [IU]/kg | Freq: Two times a day (BID) | SUBCUTANEOUS | Status: DC
  Administered 2020-06-21 (×2): 16 [IU] via SUBCUTANEOUS
  Filled 2020-06-20 (×4): qty 0.16

## 2020-06-20 MED ORDER — LINAGLIPTIN 5 MG PO TABS
5.0000 mg | ORAL_TABLET | Freq: Every day | ORAL | Status: DC
  Administered 2020-06-21 – 2020-07-19 (×28): 5 mg via ORAL
  Filled 2020-06-20 (×32): qty 1

## 2020-06-20 MED ORDER — ALBUTEROL SULFATE HFA 108 (90 BASE) MCG/ACT IN AERS
2.0000 | INHALATION_SPRAY | Freq: Four times a day (QID) | RESPIRATORY_TRACT | Status: DC
  Administered 2020-06-20 – 2020-07-15 (×93): 2 via RESPIRATORY_TRACT
  Filled 2020-06-20 (×2): qty 6.7

## 2020-06-20 MED ORDER — METHYLPREDNISOLONE SODIUM SUCC 125 MG IJ SOLR
125.0000 mg | INTRAMUSCULAR | Status: AC
  Administered 2020-06-20: 125 mg via INTRAVENOUS
  Filled 2020-06-20: qty 2

## 2020-06-20 MED ORDER — LISINOPRIL 5 MG PO TABS
5.0000 mg | ORAL_TABLET | Freq: Every day | ORAL | Status: DC
  Administered 2020-06-21 – 2020-06-26 (×5): 5 mg via ORAL
  Filled 2020-06-20 (×6): qty 1

## 2020-06-20 MED ORDER — METHYLPREDNISOLONE SODIUM SUCC 125 MG IJ SOLR
1.0000 mg/kg | Freq: Two times a day (BID) | INTRAMUSCULAR | Status: AC
  Administered 2020-06-21 – 2020-06-23 (×6): 104.375 mg via INTRAVENOUS
  Filled 2020-06-20 (×7): qty 2

## 2020-06-20 MED ORDER — ATORVASTATIN CALCIUM 80 MG PO TABS
80.0000 mg | ORAL_TABLET | Freq: Every day | ORAL | Status: DC
  Administered 2020-06-21 – 2020-07-19 (×29): 80 mg via ORAL
  Filled 2020-06-20 (×2): qty 1
  Filled 2020-06-20: qty 4
  Filled 2020-06-20 (×5): qty 1
  Filled 2020-06-20: qty 4
  Filled 2020-06-20 (×4): qty 1
  Filled 2020-06-20: qty 4
  Filled 2020-06-20 (×9): qty 1
  Filled 2020-06-20 (×2): qty 4
  Filled 2020-06-20: qty 1
  Filled 2020-06-20 (×2): qty 4

## 2020-06-20 MED ORDER — ACETAMINOPHEN 325 MG PO TABS
650.0000 mg | ORAL_TABLET | Freq: Four times a day (QID) | ORAL | Status: DC | PRN
  Administered 2020-07-01 – 2020-07-12 (×2): 650 mg via ORAL
  Filled 2020-06-20 (×3): qty 2

## 2020-06-20 MED ORDER — HYDROCOD POLST-CPM POLST ER 10-8 MG/5ML PO SUER
5.0000 mL | Freq: Two times a day (BID) | ORAL | Status: DC | PRN
  Administered 2020-06-26 – 2020-07-18 (×18): 5 mL via ORAL
  Filled 2020-06-20 (×18): qty 5

## 2020-06-20 NOTE — Progress Notes (Signed)
Remdesivir - Pharmacy Brief Note   O:  ALT: 17 CXR:  SpO2: 95 % on RA    A/P:  Remdesivir 200 mg IVPB once followed by 100 mg IVPB daily x 4 days.   Catharine Kettlewell D 06/20/2020 9:17 PM

## 2020-06-20 NOTE — ED Notes (Signed)
Pt placed on NRB at 15L in triage and upon arrival to treatment room has an oxygen saturation of 97% with a RR of 30.

## 2020-06-20 NOTE — ED Provider Notes (Signed)
[facility] Emergency Department Provider Note   ____________________________________________   Event Date/Time   First MD Initiated Contact with Patient 06/20/20 2003     (approximate)  I have reviewed the triage vital signs and the nursing notes.   HISTORY  Chief Complaint Dizziness and Hypoxia   EM caveat severe hypoxia   HPI Scott Lawson. is a 80 y.o. male history of diabetes and hypertension  Patient reports that since Saturday he just has not quite felt well.  Fatigue.  Today he has been feeling well unwell, he cannot really describe why.  Started feeling short of breath earlier   Past Medical History:  Diagnosis Date  . BPH (benign prostatic hyperplasia)   . Diabetes mellitus without complication ([place])   . Elevated PSA   . GERD (gastroesophageal reflux disease)   . Hypertension   . Hypertriglyceridemia   . Nocturia     Patient Active Problem List   Diagnosis Date Noted  . Hyperglycemia due to type 2 diabetes mellitus ([place]) 06/20/2020  . Generalized weakness 06/20/2020  . Pneumonia due to COVID-19 virus 06/20/2020  . Acute respiratory failure due to COVID-19 ([place]) 06/20/2020  . Obesity (BMI 30-39.9) 06/20/2020  . Morbid obesity ([place]) 03/22/2020  . Atherosclerosis of aorta ([place]) 10/12/2019  . Long-term insulin use ([place]) 10/12/2019  . Vitamin B12 deficiency 06/28/2018  . Prostate cancer ([place]) 06/27/2018  . Psoriasis 08/16/2017  . Advanced care planning/counseling discussion 12/01/2016  . Acute renal failure superimposed on stage 3a chronic kidney disease ([place]) 08/14/2015  . GERD (gastroesophageal reflux disease) 08/14/2015  . Uncontrolled type 2 diabetes mellitus with chronic kidney disease ([place]) 08/13/2015  . 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[place]) 08/13/2015  . CKD (chronic kidney disease), stage III ([place]) 02/11/2015  . Hyperlipidemia associated with type 2 diabetes mellitus ([place])  02/11/2015  . BPH (benign prostatic hyperplasia) 02/11/2015    Past Surgical History:  Procedure Laterality Date  . APPENDECTOMY    . CARPAL TUNNEL RELEASE Left 12/11/2015   Procedure: CARPAL TUNNEL RELEASE;  Surgeon: Earnestine Leys, MD;  Location: [facility] ORS;  Service: Orthopedics;  Laterality: Left;  . EYE SURGERY     laser  . TRANSURETHRAL RESECTION OF PROSTATE N/A 08/02/2017   Procedure: TRANSURETHRAL RESECTION OF THE PROSTATE (TURP);  Surgeon: Hollice Espy, MD;  Location: [facility] ORS;  Service: Urology;  Laterality: N/A;    Prior to Admission medications   Medication Sig Start Date End Date Taking? Authorizing Provider  aspirin 81 MG tablet Take 81 mg by mouth daily.     [provider]  atorvastatin (LIPITOR) 80 MG tablet Take 1 tablet (80 mg total) by mouth daily. 10/23/19   Cannady, Henrine Screws T, NP  cyanocobalamin 1000 MCG tablet Take 1,000 mcg by mouth daily.    [provider]  fluticasone (FLONASE) 50 MCG/ACT nasal spray Place 2 sprays into both nostrils daily as needed.     [provider]  insulin glargine (LANTUS) 100 UNIT/ML injection Inject 40 Units into the skin every evening. 1800    [provider]  insulin lispro (HUMALOG KWIKPEN) 100 UNIT/ML KwikPen Inject 0.1 mLs (10 Units total) into the skin 3 (three) times daily before meals. Continue to check blood sugar after meals. 07/21/19   Cannady, Henrine Screws T, NP  lisinopril (ZESTRIL) 5 MG tablet Take 1 tablet (5 mg total) by mouth daily. 01/26/20   Cannady, Henrine Screws T, NP  metFORMIN (GLUCOPHAGE) 500 MG tablet Take 2  tablets (1,000 mg total) by mouth 2 (two) times daily with a meal. 1000mg  in the morning 1000mg  at night 05/17/20   Cannady, Jolene T, NP  omeprazole (PRILOSEC) 20 MG capsule TAKE 1 CAPSULE EVERY DAY 11/06/19   Cannady, 14/3/21 T, NP  tamsulosin (FLOMAX) 0.4 MG CAPS capsule TAKE 1 CAPSULE EVERY DAY 05/20/20   Cannady, Corrie Dandy T, NP  TRUE METRIX BLOOD GLUCOSE TEST test strip CHECK BLOOD SUGAR TWICE  DAILY 12/11/19   Corrie Dandy T, NP  TRUEplus Lancets 33G MISC CHECK BLOOD SUGAR TWICE DAILY 10/04/19   Aura Dials T, NP    Allergies Gabapentin  Family History  Problem Relation Age of Onset  . Cancer Mother        lung  . Heart disease Father   . Diabetes Brother   . Prostate cancer Neg Hx   . Bladder Cancer Neg Hx   . Kidney cancer Neg Hx     Social History Social History   Tobacco Use  . Smoking status: Never Smoker  . Smokeless tobacco: Never Used  Vaping Use  . Vaping Use: Never used  Substance Use Topics  . Alcohol use: No    Alcohol/week: 0.0 standard drinks  . Drug use: No    Review of Systems Constitutional: No fever/chills.  Reports feeling fatigued Eyes: No visual changes. ENT: No sore throat. Cardiovascular: Denies chest pain. Respiratory: Was feeling really short of breath better now on oxygen Gastrointestinal: No abdominal pain.   Genitourinary: Negative for dysuria. Musculoskeletal: Negative for back pain. Skin: Negative for rash. Neurological: Negative for headaches, areas of focal weakness or numbness.    ____________________________________________   PHYSICAL EXAM:  VITAL SIGNS: ED Triage Vitals  Enc Vitals Group     BP 06/20/20 1943 119/69     Pulse Rate 06/20/20 1943 91     Resp 06/20/20 1943 (!) 30     Temp 06/20/20 1943 99 F (37.2 C)     Temp Source 06/20/20 1943 Oral     SpO2 06/20/20 1943 (!) 48 %     Weight 06/20/20 1944 230 lb 2.6 oz (104.4 kg)     Height --      Head Circumference --      Peak Flow --      Pain Score 06/20/20 1944 0     Pain Loc --      Pain Edu? --      Excl. in GC? --     Constitutional: Alert and oriented.  Mildly ill-appearing, somewhat tachypneic on nonrebreather oxygen saturation in the high 90s.  Speaks in phrases.  Denies pain Eyes: Conjunctivae are normal. Head: Atraumatic.  Very hard of hearing Nose: No congestion/rhinnorhea. Mouth/Throat: Mucous membranes are somewhat dry. Neck:  No stridor.  Cardiovascular: Normal rate, regular rhythm. Grossly normal heart sounds.  Good peripheral circulation. Respiratory: Modest tachypnea, mild accessory muscle use, speaks in phrases. Gastrointestinal: Soft and nontender. No distention. Musculoskeletal: No lower extremity tenderness nor edema. Neurologic:  Normal speech and language. No gross focal neurologic deficits are appreciated.  Skin:  Skin is warm, dry and intact. No rash noted. Psychiatric: Mood and affect are normal. Speech and behavior are normal.  ____________________________________________   LABS (all labs ordered are listed, but only abnormal results are displayed)  Labs Reviewed  CBC - Abnormal; Notable for the following components:      Result Value   nRBC 0.3 (*)    All other components within normal limits  BRAIN NATRIURETIC PEPTIDE -  Abnormal; Notable for the following components:   B Natriuretic Peptide 168.9 (*)    All other components within normal limits  BASIC METABOLIC PANEL - Abnormal; Notable for the following components:   Glucose, Bld 365 (*)    BUN 32 (*)    Creatinine, Ser 1.69 (*)    Calcium 8.4 (*)    GFR, Estimated 41 (*)    All other components within normal limits  POC SARS CORONAVIRUS 2 AG -  ED - Abnormal; Notable for the following components:   SARS Coronavirus 2 Ag POSITIVE (*)    All other components within normal limits  TROPONIN I (HIGH SENSITIVITY) - Abnormal; Notable for the following components:   Troponin I (High Sensitivity) 61 (*)    All other components within normal limits  PROCALCITONIN  CBC WITH DIFFERENTIAL/PLATELET  COMPREHENSIVE METABOLIC PANEL  C-REACTIVE PROTEIN  FIBRIN DERIVATIVES D-DIMER ([facility] ONLY)   ____________________________________________  EKG  Reviewed and interpreted by me at 1930 Heart rate 90 QRS 130 QTc 480 Left ventricular hypertrophy, repolarization abnormality. ____________________________________________  RADIOLOGY  DG Chest  Portable 1 View  Result Date: 06/20/2020 CLINICAL DATA:  80 year old male with hypoxia. EXAM: PORTABLE CHEST 1 VIEW COMPARISON:  12/23/2017 FINDINGS: UPPER limits normal heart size noted. Bilateral interstitial/airspace opacities are noted. No large pleural effusion or pneumothorax noted. No acute bony abnormalities are identified. IMPRESSION: Bilateral interstitial/airspace opacities - question edema versus infection. Electronically Signed   By: Margarette Canada M.D.   On: 06/20/2020 20:25    Chest x-ray personally viewed by me, concerning for bilateral airspace opacities ____________________________________________   PROCEDURES  Procedure(s) performed: None  Procedures  Critical Care performed: Yes, see critical care note(s)  CRITICAL CARE Performed by: Delman Kitten   Total critical care time: 30 minutes  Critical care time was exclusive of separately billable procedures and treating other patients.  Critical care was necessary to treat or prevent imminent or life-threatening deterioration.  Critical care was time spent personally by me on the following activities: development of treatment plan with patient and/or surrogate as well as nursing, discussions with consultants, evaluation of patient's response to treatment, examination of patient, obtaining history from patient or surrogate, ordering and performing treatments and interventions, ordering and review of laboratory studies, ordering and review of radiographic studies, pulse oximetry and re-evaluation of patient's condition.   ____________________________________________   INITIAL IMPRESSION / ASSESSMENT AND PLAN / ED COURSE  Pertinent labs & imaging results that were available during my care of the patient were reviewed by me and considered in my medical decision making (see chart for details).   Patient presents for concerns of fatigue, noted to be significantly hypoxic with associated shortness of breath.  Covid test positive  and given his chest x-ray findings and presentation I suspect patient suffering primarily from acute COVID-19 with respiratory failure due to hypoxia.  Clinical Course as of 06/20/20 2210  Thu Jun 20, 2020  2058 Attempt attempted to reach patient's daughter via phone, no answer no voicemail patient gave permission [MQ]  2059 COVID positive [MQ]  2135 Discussed treatment recommendations with steroids and remdesivir including potential side effects with the patient he is in agreement with proceeding.  He is however very adamant that he does not wish to receive a Covid vaccine, but is okay with receiving additional or other treatments [MQ]    Clinical Course User Index [MQ] Delman Kitten, MD   ----------------------------------------- 10:11 PM on 06/20/2020 -----------------------------------------  Patient tolerating heated high flow nasal cannula well,  understanding of plan for admission.  Admission discussed with hospitalist Dr. Damita Dunnings.  ____________________________________________   FINAL CLINICAL IMPRESSION(S) / ED DIAGNOSES  Final diagnoses:  COVID-19  Acute respiratory failure with hypoxia [facility])        Note:  This document was prepared using Dragon voice recognition software and may include unintentional dictation errors       Delman Kitten, MD 06/20/20 2211

## 2020-06-20 NOTE — ED Triage Notes (Addendum)
Pt to ED reporting dizziness and lightheadedness since Saturday. Pt repeatedly stating to this nurse, "something is just wrong" and "I don't feel well." Lips were blue and pt is tachypnic upon arrival to ED triage room but pt able to speak without distress. Spo2 of 48% on RA in triage. Pt reporting a similar event occurred years ago but denies other pulmonary hx at this time.

## 2020-06-20 NOTE — H&P (Signed)
History and Physical    [name] YW:[id number] DOB: Apr 06, 1941 DOA: 06/20/2020  PCP: Venita Lick, NP   Patient coming from: Home  I have personally briefly reviewed patient's old medical records in [place]  Chief Complaint: Weakness x5 days, shortness of breath x 1 day  HPI: Javontae Dickes. is a 80 y.o. male with medical history significant for diabetes, BPH, HTN, HLD, obesity and GERD, who presents to the emergency room with a 5-day history of generalized malaise associated with lightheadedness and then over the past 24 hours, started feeling short of breath.  He denies chest pain, fever or chills, vomiting or diarrhea or abdominal pain.  He is unvaccinated against COVID and states he is not interested in getting the vaccine ED Course: On arrival to the ED O2 sat was 48% on room air.  Temperature 99, BP 119/69 with respiratory rate 30 and O2 sat 48% on room air improving to 97% with nonrebreather mask at 15 L.  He tested positive for COVID with rapid test.  Blood work significant for creatinine of 1.69 up from baseline of 1.13 and BNP of 169.  Chest x-ray showing multifocal opacities.  EKG as interpreted by me: Normal sinus at 91. Patient continued to have increased work of breathing on NRB and he was subsequently placed on heated high flow and is currently on 50 L satting in the low to mid 90s.  Hospitalist consulted for admission.  Review of Systems: Limited due to clinical situation, respiratory distress  Past Medical History:  Diagnosis Date   BPH (benign prostatic hyperplasia)    Diabetes mellitus without complication (HCC)    Elevated PSA    GERD (gastroesophageal reflux disease)    Hypertension    Hypertriglyceridemia    Nocturia     Past Surgical History:  Procedure Laterality Date   APPENDECTOMY     CARPAL TUNNEL RELEASE Left 12/11/2015   Procedure: CARPAL TUNNEL RELEASE;  Surgeon: Earnestine Leys, MD;  Location: [facility] ORS;  Service:  Orthopedics;  Laterality: Left;   EYE SURGERY     laser   TRANSURETHRAL RESECTION OF PROSTATE N/A 08/02/2017   Procedure: TRANSURETHRAL RESECTION OF THE PROSTATE (TURP);  Surgeon: Hollice Espy, MD;  Location: [facility] ORS;  Service: Urology;  Laterality: N/A;     reports that he has never smoked. He has never used smokeless tobacco. He reports that he does not drink alcohol and does not use drugs.  Allergies  Allergen Reactions   Gabapentin Other (See Comments)    "made me fall out"    Family History  Problem Relation Age of Onset   Cancer Mother        lung   Heart disease Father    Diabetes Brother    Prostate cancer Neg Hx    Bladder Cancer Neg Hx    Kidney cancer Neg Hx       Prior to Admission medications   Medication Sig Start Date End Date Taking? Authorizing Provider  aspirin 81 MG tablet Take 81 mg by mouth daily.     [provider]  atorvastatin (LIPITOR) 80 MG tablet Take 1 tablet (80 mg total) by mouth daily. 10/23/19   Cannady, Henrine Screws T, NP  cyanocobalamin 1000 MCG tablet Take 1,000 mcg by mouth daily.    [provider]  fluticasone (FLONASE) 50 MCG/ACT nasal spray Place 2 sprays into both nostrils daily as needed.     [provider]  insulin glargine (  LANTUS) 100 UNIT/ML injection Inject 40 Units into the skin every evening. 1800    [provider]  insulin lispro (HUMALOG KWIKPEN) 100 UNIT/ML KwikPen Inject 0.1 mLs (10 Units total) into the skin 3 (three) times daily before meals. Continue to check blood sugar after meals. 07/21/19   Cannady, Henrine Screws T, NP  lisinopril (ZESTRIL) 5 MG tablet Take 1 tablet (5 mg total) by mouth daily. 01/26/20   Marnee Guarneri T, NP  metFORMIN (GLUCOPHAGE) 500 MG tablet Take 2 tablets (1,000 mg total) by mouth 2 (two) times daily with a meal. 1000mg  in the morning 1000mg  at night 05/17/20   Cannady, Henrine Screws T, NP  omeprazole (PRILOSEC) 20 MG capsule TAKE 1 CAPSULE EVERY DAY 11/06/19   Marnee Guarneri T, NP  tamsulosin (FLOMAX) 0.4 MG CAPS capsule TAKE 1 CAPSULE EVERY DAY 05/20/20   Marnee Guarneri T, NP  TRUE METRIX BLOOD GLUCOSE TEST test strip CHECK BLOOD SUGAR TWICE DAILY 12/11/19   Marnee Guarneri T, NP  TRUEplus Lancets 33G MISC CHECK BLOOD SUGAR TWICE DAILY 10/04/19   Marnee Guarneri T, NP    Physical Exam: Vitals:   06/20/20 1947 06/20/20 2000 06/20/20 2030 06/20/20 2100  BP:  96/78 (!) 93/51 (!) 89/61  Pulse:  80 74 76  Resp:  (!) 43 (!) 46 (!) 37  Temp:      TempSrc:      SpO2: 97% 96% 96% 95%  Weight:         Vitals:   06/20/20 1947 06/20/20 2000 06/20/20 2030 06/20/20 2100  BP:  96/78 (!) 93/51 (!) 89/61  Pulse:  80 74 76  Resp:  (!) 43 (!) 46 (!) 37  Temp:      TempSrc:      SpO2: 97% 96% 96% 95%  Weight:          Constitutional: Alert and oriented x 3 .  Moderate respiratory distress HEENT:      Head: Normocephalic and atraumatic.         Eyes: PERLA, EOMI, Conjunctivae are normal. Sclera is non-icteric.       Mouth/Throat: Mucous membranes are moist.       Neck: Supple with no signs of meningismus. Cardiovascular: Regular rate and rhythm. No murmurs, gallops, or rubs. 2+ symmetrical distal pulses are present . No JVD. No LE edema Respiratory: Respiratory effort increased, speaking in 3 word sentences.  Heated high flow on.  Coarse breath sounds bilaterally Gastrointestinal: Soft, non tender, and non distended with positive bowel sounds.  Genitourinary: No CVA tenderness. Musculoskeletal: Nontender with normal range of motion in all extremities. No cyanosis, or erythema of extremities. Neurologic:  Face is symmetric. Moving all extremities. No gross focal neurologic deficits . Skin: Skin is warm, dry.  No rash or ulcers Psychiatric: Mood and affect are normal    Labs on Admission: I have personally reviewed following labs and imaging studies  CBC: Recent Labs  Lab 06/20/20 1931  WBC 6.8  HGB 14.0  HCT 43.3  MCV 90.4  PLT [id number]   Basic  Metabolic Panel: Recent Labs  Lab 06/20/20 2056  NA 137  K 4.2  CL 98  CO2 24  GLUCOSE 365*  BUN 32*  CREATININE 1.69*  CALCIUM 8.4*   GFR: Estimated Creatinine Clearance: 41.5 mL/min (A) (by C-G formula based on SCr of 1.69 mg/dL (H)). Liver Function Tests: No results for input(s): AST, ALT, ALKPHOS, BILITOT, PROT, ALBUMIN in the last 168 hours. No results for input(s): LIPASE, AMYLASE  in the last 168 hours. No results for input(s): AMMONIA in the last 168 hours. Coagulation Profile: No results for input(s): INR, PROTIME in the last 168 hours. Cardiac Enzymes: No results for input(s): CKTOTAL, CKMB, CKMBINDEX, TROPONINI in the last 168 hours. BNP (last 3 results) No results for input(s): PROBNP in the last 8760 hours. HbA1C: No results for input(s): HGBA1C in the last 72 hours. CBG: No results for input(s): GLUCAP in the last 168 hours. Lipid Profile: No results for input(s): CHOL, HDL, LDLCALC, TRIG, CHOLHDL, LDLDIRECT in the last 72 hours. Thyroid Function Tests: No results for input(s): TSH, T4TOTAL, FREET4, T3FREE, THYROIDAB in the last 72 hours. Anemia Panel: No results for input(s): VITAMINB12, FOLATE, FERRITIN, TIBC, IRON, RETICCTPCT in the last 72 hours. Urine analysis:    Component Value Date/Time   COLORURINE YELLOW 07/13/2019 1117   APPEARANCEUR CLEAR 07/13/2019 1117   APPEARANCEUR Clear 12/20/2017 0931   LABSPEC 1.015 07/13/2019 1117   LABSPEC 1.018 07/15/2014 2216   PHURINE 5.0 07/13/2019 1117   GLUCOSEU 500 (A) 07/13/2019 1117   GLUCOSEU >=500 07/15/2014 2216   HGBUR NEGATIVE 07/13/2019 1117   BILIRUBINUR NEGATIVE 07/13/2019 1117   BILIRUBINUR Negative 12/20/2017 0931   BILIRUBINUR Negative 07/15/2014 2216   KETONESUR NEGATIVE 07/13/2019 1117   PROTEINUR NEGATIVE 07/13/2019 1117   NITRITE NEGATIVE 07/13/2019 1117   LEUKOCYTESUR NEGATIVE 07/13/2019 1117   LEUKOCYTESUR Negative 07/15/2014 2216    Radiological Exams on Admission: DG Chest Portable  1 View  Result Date: 06/20/2020 CLINICAL DATA:  80 year old male with hypoxia. EXAM: PORTABLE CHEST 1 VIEW COMPARISON:  12/23/2017 FINDINGS: UPPER limits normal heart size noted. Bilateral interstitial/airspace opacities are noted. No large pleural effusion or pneumothorax noted. No acute bony abnormalities are identified. IMPRESSION: Bilateral interstitial/airspace opacities - question edema versus infection. Electronically Signed   By: Harmon Pier M.D.   On: 06/20/2020 20:25     Assessment/Plan 80 year old male with history of diabetes, BPH, HTN, HLD, CKD 3A obesity and GERD, presenting with generalized malaise, lightheadedness and short of breath.    He is unvaccinated against COVID    Pneumonia due to COVID-19 virus   Acute respiratory failure due to COVID-19 [facility]) - Patient with shortness of breath, increased work of breathing speaking in short sentences O2 sat 48% on arrival currently on heated high flow at 50 L to keep sats in the low 90s, and chest x-ray showing multifocal opacities - We will continue remdesivir started in the emergency room though probably of little benefit at this time -Baricitinib, methylprednisolone, albuterol, antitussives and vitamins - Oxygen delivery system to maintain sats over 92% - Admit to stepdown  Acute renal failure superimposed on stage 3a chronic kidney disease -creatinine 1.61 up from baseline of 1.13    Hyperglycemia due to type 2 diabetes mellitus (HCC) - Basal and Premeal insulin - Sliding scale insulin coverage    Generalized weakness related to COVID - Nutritionist consult    Obesity (BMI 30-39.9) - Complicating factor to prognosis    DVT prophylaxis: Lovenox  Code Status: full code  Family Communication:  none  Disposition Plan: Back to previous home environment Consults called: none  Status:At the time of admission, it appears that the appropriate admission status for this patient is INPATIENT. This is judged to be reasonable and  necessary in order to provide the required intensity of service to ensure the patient's safety given the presenting symptoms, physical exam findings, and initial radiographic and laboratory data in the context of their  Comorbid  conditions.   Patient requires inpatient status due to high intensity of service, high risk for further deterioration and high frequency of surveillance required.   I certify that at the point of admission it is my clinical judgment that the patient will require inpatient hospital care spanning beyond [place] MD [facility]     06/20/2020, 10:03 PM

## 2020-06-20 NOTE — Progress Notes (Signed)
PHARMACIST - PHYSICIAN COMMUNICATION  CONCERNING:  Enoxaparin (Lovenox) for DVT Prophylaxis    RECOMMENDATION: Patient was prescribed enoxaprin 40mg  q24 hours for VTE prophylaxis.   Filed Weights   06/20/20 1944  Weight: 104.4 kg (230 lb 2.6 oz)    Body mass index is 36.05 kg/m.  Estimated Creatinine Clearance: 41.5 mL/min (A) (by C-G formula based on SCr of 1.69 mg/dL (H)).   Based on [facility] policy patient is candidate for enoxaparin 0.5mg /kg TBW SQ every 24 hours based on BMI being >30.  DESCRIPTION: Pharmacy has adjusted enoxaparin dose per [facility] policy.  Patient is now receiving enoxaparin 0.5 mg/kg every 12 hours   [facility], PharmD, [facility] 06/20/2020 10:13 PM

## 2020-06-21 ENCOUNTER — Inpatient Hospital Stay: Payer: Medicare HMO

## 2020-06-21 DIAGNOSIS — N1831 Chronic kidney disease, stage 3a: Secondary | ICD-10-CM

## 2020-06-21 DIAGNOSIS — J1282 Pneumonia due to coronavirus disease 2019: Secondary | ICD-10-CM

## 2020-06-21 DIAGNOSIS — U071 COVID-19: Principal | ICD-10-CM

## 2020-06-21 DIAGNOSIS — N170 Acute kidney failure with tubular necrosis: Secondary | ICD-10-CM

## 2020-06-21 DIAGNOSIS — E1165 Type 2 diabetes mellitus with hyperglycemia: Secondary | ICD-10-CM

## 2020-06-21 LAB — PHOSPHORUS: Phosphorus: 2.8 mg/dL (ref 2.5–4.6)

## 2020-06-21 LAB — COMPREHENSIVE METABOLIC PANEL
ALT: 20 U/L (ref 0–44)
AST: 49 U/L — ABNORMAL HIGH (ref 15–41)
Albumin: 2.9 g/dL — ABNORMAL LOW (ref 3.5–5.0)
Alkaline Phosphatase: 77 U/L (ref 38–126)
Anion gap: 12 (ref 5–15)
BUN: 35 mg/dL — ABNORMAL HIGH (ref 8–23)
CO2: 23 mmol/L (ref 22–32)
Calcium: 8 mg/dL — ABNORMAL LOW (ref 8.9–10.3)
Chloride: 102 mmol/L (ref 98–111)
Creatinine, Ser: 1.58 mg/dL — ABNORMAL HIGH (ref 0.61–1.24)
GFR, Estimated: 44 mL/min — ABNORMAL LOW (ref >60)
Glucose, Bld: 422 mg/dL — ABNORMAL HIGH (ref 70–99)
Potassium: 4.5 mmol/L (ref 3.5–5.1)
Sodium: 137 mmol/L (ref 135–145)
Total Bilirubin: 0.9 mg/dL (ref 0.3–1.2)
Total Protein: 7.2 g/dL (ref 6.5–8.1)

## 2020-06-21 LAB — CBC WITH DIFFERENTIAL/PLATELET
Abs Immature Granulocytes: 0.02 10*3/uL (ref 0.00–0.07)
Basophils Absolute: 0 10*3/uL (ref 0.0–0.1)
Basophils Relative: 0 %
Eosinophils Absolute: 0 10*3/uL (ref 0.0–0.5)
Eosinophils Relative: 0 %
HCT: 39.7 % (ref 39.0–52.0)
Hemoglobin: 13.1 g/dL (ref 13.0–17.0)
Immature Granulocytes: 1 %
Lymphocytes Relative: 13 %
Lymphs Abs: 0.6 10*3/uL — ABNORMAL LOW (ref 0.7–4.0)
MCH: 29.6 pg (ref 26.0–34.0)
MCHC: 33 g/dL (ref 30.0–36.0)
MCV: 89.8 fL (ref 80.0–100.0)
Monocytes Absolute: 0.1 10*3/uL (ref 0.1–1.0)
Monocytes Relative: 3 %
Neutro Abs: 3.4 10*3/uL (ref 1.7–7.7)
Neutrophils Relative %: 83 %
Platelets: 276 10*3/uL (ref 150–400)
RBC: 4.42 MIL/uL (ref 4.22–5.81)
RDW: 13.1 % (ref 11.5–15.5)
Smear Review: NORMAL
WBC: 4.1 10*3/uL (ref 4.0–10.5)
nRBC: 0 % (ref 0.0–0.2)

## 2020-06-21 LAB — BLOOD GAS, ARTERIAL
Acid-Base Excess: 0.8 mmol/L (ref 0.0–2.0)
Bicarbonate: 24.2 mmol/L (ref 20.0–28.0)
FIO2: 100
O2 Saturation: 98.9 %
Patient temperature: 37
pCO2 arterial: 34 mmHg (ref 32.0–48.0)
pH, Arterial: 7.46 — ABNORMAL HIGH (ref 7.350–7.450)
pO2, Arterial: 123 mmHg — ABNORMAL HIGH (ref 83.0–108.0)

## 2020-06-21 LAB — CBG MONITORING, ED
Glucose-Capillary: 479 mg/dL — ABNORMAL HIGH (ref 70–99)
Glucose-Capillary: 416 mg/dL — ABNORMAL HIGH (ref 70–99)
Glucose-Capillary: 211 mg/dL — ABNORMAL HIGH (ref 70–99)
Glucose-Capillary: 170 mg/dL — ABNORMAL HIGH (ref 70–99)

## 2020-06-21 LAB — C-REACTIVE PROTEIN: CRP: 23.8 mg/dL — ABNORMAL HIGH (ref <1.0)

## 2020-06-21 LAB — MAGNESIUM: Magnesium: 2.1 mg/dL (ref 1.7–2.4)

## 2020-06-21 LAB — D-DIMER, QUANTITATIVE: D-Dimer, Quant: 9.52 ug/mL-FEU — ABNORMAL HIGH (ref 0.00–0.50)

## 2020-06-21 MED ORDER — INSULIN ASPART 100 UNIT/ML ~~LOC~~ SOLN
20.0000 [IU] | Freq: Once | SUBCUTANEOUS | Status: AC
  Administered 2020-06-21: 20 [IU] via SUBCUTANEOUS
  Filled 2020-06-21: qty 1

## 2020-06-21 MED ORDER — INSULIN DETEMIR 100 UNIT/ML ~~LOC~~ SOLN
24.0000 [IU] | Freq: Two times a day (BID) | SUBCUTANEOUS | Status: DC
  Administered 2020-06-21 – 2020-06-24 (×7): 24 [IU] via SUBCUTANEOUS
  Filled 2020-06-21 (×10): qty 0.24

## 2020-06-21 MED ORDER — INSULIN ASPART 100 UNIT/ML ~~LOC~~ SOLN
10.0000 [IU] | Freq: Three times a day (TID) | SUBCUTANEOUS | Status: DC
Start: 2020-06-21 — End: 2020-06-25
  Administered 2020-06-21 – 2020-06-22 (×2): 10 [IU] via SUBCUTANEOUS
  Filled 2020-06-21 (×5): qty 1

## 2020-06-21 MED ORDER — BARICITINIB 1 MG PO TABS
2.0000 mg | ORAL_TABLET | Freq: Every day | ORAL | Status: AC
  Administered 2020-06-21 – 2020-07-04 (×14): 2 mg via ORAL
  Filled 2020-06-21 (×3): qty 1
  Filled 2020-06-21: qty 2
  Filled 2020-06-21 (×13): qty 1
  Filled 2020-06-21: qty 2
  Filled 2020-06-21 (×3): qty 1

## 2020-06-21 NOTE — ED Notes (Signed)
SPO2 now 88-91%.

## 2020-06-21 NOTE — ED Notes (Signed)
Sent msg to pharmacy re: missing detemir dose.

## 2020-06-21 NOTE — ED Notes (Signed)
Attempted to call Dr Damita Dunnings, who is listed as attending. Had sent secure chat (see earlier note) re: CBG 479. No answer.

## 2020-06-21 NOTE — ED Notes (Signed)
Breakfast tray given to pt 

## 2020-06-21 NOTE — ED Notes (Signed)
RT at bedside for ABG

## 2020-06-21 NOTE — ED Notes (Signed)
Notified Dr Damita Dunnings of CBG of 479. Called pharmacy to have scheduled aspart 6 units sent. Awaiting instructions from Dr Damita Dunnings for sliding scale insulin coverage. Pt alert and oriented times 4.

## 2020-06-21 NOTE — ED Notes (Signed)
[place] NP notified patient oxygen saturations 85-90%. Patient sleeping.

## 2020-06-21 NOTE — ED Notes (Signed)
Rt at bedside

## 2020-06-21 NOTE — Progress Notes (Signed)
Inpatient Diabetes Program Recommendations  AACE/ADA: New Consensus Statement on Inpatient Glycemic Control (2015)  Target Ranges:  Prepandial:   less than 140 mg/dL      Peak postprandial:   less than 180 mg/dL (1-2 hours)      Critically ill patients:  140 - 180 mg/dL   Lab Results  Component Value Date   GLUCAP 416 (H) 06/21/2020   HGBA1C 8.0 (H) 05/17/2020    Review of Glycemic Control Results for TABOR, DENHAM (MRN [id number]) as of 06/21/2020 13:02  Ref. Range 06/20/2020 23:33 06/21/2020 07:52 06/21/2020 11:27  Glucose-Capillary Latest Ref Range: 70 - 99 mg/dL 362 (H) 479 (H) 416 (H)   Diabetes history: DM 2 Outpatient Diabetes medications:  Lantus 25 units q evening, Humalog 10 units tid with meals, Metformin 1000 mg bid Current orders for Inpatient glycemic control:  Novolog resistant tid with meals and HS Novolog 6 units tid with meals Levemir 16 units bid Tradjenta 5 mg daily  Inpatient Diabetes Program Recommendations:    Please consider increasing Levemir to 24 units bid.  Also consider increasing Novolog meal coverage to 10 units tid with meals.    Thanks  Adah Perl, RN, BC-ADM Inpatient Diabetes Coordinator Pager [phone] (8a-5p)

## 2020-06-21 NOTE — ED Notes (Signed)
Nonrebrather was off. SPO2 was reqading 88.  Reapplied nonrebrather over HFNC. NRB at [place]. HFNC at 45L. SPO2 now 91. Pt alert and oriented times 4.

## 2020-06-21 NOTE — ED Notes (Signed)
Pt removed both HFNC and nonrebreather to eat. SPO2 dropped to 50%. This nurse reapplied both oxygen devices and explained importance of keeping both devices on. Explained that pt can lift mask up to take bites of food. SPO2 now 85%.

## 2020-06-21 NOTE — ED Notes (Signed)
SPO2 now 97%.

## 2020-06-21 NOTE — Consult Note (Signed)
NAME:  Scott Biehl., MRN:  [id number], DOB:  Oct 07, 1940, LOS: 1 ADMISSION DATE:  06/20/2020, CONSULTATION DATE:  06/21/2020 REFERRING MD:  Dr. Dwyane Dee, CHIEF COMPLAINT:  Hypoxia, SOB   Brief History:  80 y.o. Male admitted with acute hypoxic respiratory failure in the setting of COVID-19 pneumonia.  History of Present Illness:  Scott Lawson is a 80 year old male with a past medical history significant for diabetes mellitus, BPH, hypertension, hyperlipidemia, obesity and GERD who presented to [facility] ED on 06/20/2020 due to shortness of breath which began approximately over the past 24 hours.  He endorses a 5-day history of generalized malaise with associated lightheadedness.  He denied chest pain, fever, chills, vomiting, diarrhea, abdominal pain.  He is not vaccinated against COVID-19 and stated he was not interested in getting the vaccine.  ED Course:  On arrival to the ED O2 sat was 48% on room air.  Temperature 99, BP 119/69 with respiratory rate 30 and O2 sat 48% on room air improving to 97% with nonrebreather mask at 15 L.  He tested positive for COVID with rapid test.  Blood work significant for creatinine of 1.69 up from baseline of 1.13 and BNP of 169.  Chest x-ray showing multifocal opacities.  EKG as interpreted by me: Normal sinus at 91. Patient continued to have increased work of breathing on NRB and he was subsequently placed on heated high flow and is currently on 50 L satting in the low to mid 90s.   Hospitalist consulted for admission for Acute Hypoxic Respiratory Failure in the setting of COVID-19 Pneumonia.  Hospital Course: He continues to have shortness of breath and high FiO2 requirements.  Therefore PCCM is consulted for the further assistance in managing severe hypoxia.    Past Medical History:  Diabetes mellitus Hypertension Hyperlipidemia BPH  GERD Nocturia   Significant Hospital Events:  1/6: Presented to ED, to be admitted by hospitalist 1/7: Patient with  increased work of breathing and high FiO2 requirements; PCCM consulted  Consults:  Hospitalist (primary service) PCCM   Procedures:  N/A  Significant Diagnostic Tests:  1/7: CXR>>Bilateral interstitial/airspace opacities - question edema versus Infection. 1/7: Venous US bilateral LE>> 1/7: Echocardiogram>>  Micro Data:  1/6: POC SARS-CoV-2 AG>> positive  Antimicrobials:  Remdesivir 1/6>>  Interim History / Subjective:  Patient reports shortness of breath of which he says is a little bit better, along with associated nonproductive dry cough Currently requiring 100% heated high flow and nonrebreather mask with O2 saturations 94 to 96% Respiratory rate ranging from 16-22 during the interview, able to speak in full sentences Denies chest pain, abdominal pain, nausea, vomiting, diarrhea, fever, chills, myalgias Hemodynamically stable, normal sinus rhythm ABG is reassuring (pH 7.46, PCO2 34, PO2 123, bicarb 24.2)  Objective   Blood pressure (!) 100/43, pulse 60, temperature 99 F (37.2 C), temperature source Oral, resp. rate (!) 24, weight 104.4 kg, SpO2 99 %.    FiO2 (%):  [80 %-100 %] 100 %   Intake/Output Summary (Last 24 hours) at 06/21/2020 1456 Last data filed at 06/21/2020 1021 Gross per 24 hour  Intake --  Output 500 ml  Net -500 ml   Filed Weights   06/20/20 1944  Weight: 104.4 kg    Examination: General: Acutely ill-appearing male, sitting in bed, on 100% heated high flow and nonrebreather mask, with mild tachypnea HENT: Atraumatic, normocephalic, neck supple, no JVD Lungs: Clear diminished bilaterally, no wheezing or rales noted, tachypnea, even Cardiovascular: Regular rate and rhythm, S1-S2,  no murmurs, rubs, gallops, 2+ distal pulses Abdomen: Soft, nontender, nondistended, no guarding or rebound tenderness, bowel sounds positive x4 Extremities: Normal bulk and tone, no deformities, no edema Neuro: Awake and alert, oriented x4, follows commands, no focal  deficits, speech clear Skin: Warm and dry.  No obvious rashes, lesions, ulcerations  Resolved Hospital Problem list   N/A  Assessment & Plan:   Acute hypoxic respiratory failure in the setting of COVID-19 pneumonia -Supplemental O2 as needed to maintain O2 sats greater than 88% -Currently on 100% heated high flow and nonrebreather -High risk for decompensation and intubation -Follow intermittent chest x-ray and ABG as needed -Bronchodilators as needed via MDI -Antitussives -Remdesivir, plan for 5-day course -IV steroids -Continue baricitinib for now, follow renal function -Vitamin C and zinc -Maintain euvolemia to negative net fluid balance as able -Encourage self proning as tolerated -Incentive spirometry and flutter valve -Follow inflammatory markers -Procalcitonin is negative -Maintain airborne and contact precautions -Unable to obtain CTA chest due to AKI, therefore will obtain venous ultrasound of bilateral lower extremities and echocardiogram to assess for RV strain given his severe hypoxia   AKI superimposed on CKD stage III (Baseline Creatine 1.13) -Monitor I&O's / urinary output -Follow BMP -Ensure adequate renal perfusion -Avoid nephrotoxic agents as able -Replace electrolytes as indicated   Diabetes Mellitus -CBG's -SSI -Follow ICU Hypo/hyperglycemia protocol   Best practice (evaluated daily)  Diet: Heart healthy Pain/Anxiety/Delirium protocol (if indicated): N/A VAP protocol (if indicated): N/A DVT prophylaxis: Lovenox SQ GI prophylaxis: N/A Glucose control: SSI Mobility: As tolerated Disposition:Stepdown  Goals of Care:  Last date of multidisciplinary goals of care discussion: N/A Family and staff present: Discussed with patient at bedside Summary of discussion: Continued treatment with Remdesivir, steroids, Baricitinib, Vitamins.  Discussed he is requiring significant amount of O2, with high risk for deterioration which would require intubation.   He is in agreement with intubation if needed Follow up goals of care discussion due: 06/22/20 Code Status: Full Code  Labs   CBC: Recent Labs  Lab 06/20/20 1931 06/21/20 0545  WBC 6.8 4.1  NEUTROABS  --  3.4  HGB 14.0 13.1  HCT 43.3 39.7  MCV 90.4 89.8  PLT 252 276    Basic Metabolic Panel: Recent Labs  Lab 06/20/20 2056 06/21/20 0545  NA 137 137  K 4.2 4.5  CL 98 102  CO2 24 23  GLUCOSE 365* 422*  BUN 32* 35*  CREATININE 1.69* 1.58*  CALCIUM 8.4* 8.0*  MG  --  2.1  PHOS  --  2.8   GFR: Estimated Creatinine Clearance: 44.4 mL/min (A) (by C-G formula based on SCr of 1.58 mg/dL (H)). Recent Labs  Lab 06/20/20 1931 06/20/20 2056 06/21/20 0545  PROCALCITON  --  <0.10  --   WBC 6.8  --  4.1    Liver Function Tests: Recent Labs  Lab 06/21/20 0545  AST 49*  ALT 20  ALKPHOS 77  BILITOT 0.9  PROT 7.2  ALBUMIN 2.9*   No results for input(s): LIPASE, AMYLASE in the last 168 hours. No results for input(s): AMMONIA in the last 168 hours.  ABG No results found for: PHART, PCO2ART, PO2ART, HCO3, TCO2, ACIDBASEDEF, O2SAT   Coagulation Profile: No results for input(s): INR, PROTIME in the last 168 hours.  Cardiac Enzymes: No results for input(s): CKTOTAL, CKMB, CKMBINDEX, TROPONINI in the last 168 hours.  HbA1C: Hemoglobin A1C  Date/Time Value Ref Range Status  03/03/2016 12:00 AM 7.1%  Final  07/16/2014 04:40 AM  9.7 (H) 4.2 - 6.3 % Final    Comment:    The American Diabetes Association recommends that a primary goal of therapy should be <7% and that physicians should reevaluate the treatment regimen in patients with HbA1c values consistently >8%.    HB A1C (BAYER DCA - WAIVED)  Date/Time Value Ref Range Status  05/17/2020 10:58 AM 8.0 (H) <7.0 % Final    Comment:                                          Diabetic Adult            <7.0                                       Healthy Adult        4.3 - 5.7                                                            (DCCT/NGSP) American Diabetes Association's Summary of Glycemic Recommendations for Adults with Diabetes: Hemoglobin A1c <7.0%. More stringent glycemic goals (A1c <6.0%) may further reduce complications at the cost of increased risk of hypoglycemia.   01/26/2020 01:10 PM 10.5 (H) <7.0 % Final    Comment:                                          Diabetic Adult            <7.0                                       Healthy Adult        4.3 - 5.7                                                           (DCCT/NGSP) American Diabetes Association's Summary of Glycemic Recommendations for Adults with Diabetes: Hemoglobin A1c <7.0%. More stringent glycemic goals (A1c <6.0%) may further reduce complications at the cost of increased risk of hypoglycemia.     CBG: Recent Labs  Lab 06/20/20 2333 06/21/20 0752 06/21/20 1127  GLUCAP 362* 479* 416*    Review of Systems:   Positives in BOLD: Gen: Denies fever, chills, weight change, fatigue, night sweats HEENT: Denies blurred vision, double vision, hearing loss, tinnitus, sinus congestion, rhinorrhea, sore throat, neck stiffness, dysphagia PULM: Denies +shortness of breath, +cough, sputum production, hemoptysis, wheezing CV: Denies chest pain, edema, orthopnea, paroxysmal nocturnal dyspnea, palpitations GI: Denies abdominal pain, nausea, vomiting, diarrhea, hematochezia, melena, constipation, change in bowel habits GU: Denies dysuria, hematuria, polyuria, oliguria, urethral discharge Endocrine: Denies hot or cold intolerance, polyuria, polyphagia or appetite change Derm: Denies rash, dry skin, scaling or peeling skin change Heme: Denies  easy bruising, bleeding, bleeding gums Neuro: Denies headache, numbness, weakness, slurred speech, loss of memory or consciousness   Past Medical History:  He,  has a past medical history of BPH (benign prostatic hyperplasia), Diabetes mellitus without complication ([place]), Elevated PSA, GERD  (gastroesophageal reflux disease), Hypertension, Hypertriglyceridemia, and Nocturia.   Surgical History:   Past Surgical History:  Procedure Laterality Date  . APPENDECTOMY    . CARPAL TUNNEL RELEASE Left 12/11/2015   Procedure: CARPAL TUNNEL RELEASE;  Surgeon: Earnestine Leys, MD;  Location: [facility] ORS;  Service: Orthopedics;  Laterality: Left;  . EYE SURGERY     laser  . TRANSURETHRAL RESECTION OF PROSTATE N/A 08/02/2017   Procedure: TRANSURETHRAL RESECTION OF THE PROSTATE (TURP);  Surgeon: Hollice Espy, MD;  Location: [facility] ORS;  Service: Urology;  Laterality: N/A;     Social History:   reports that he has never smoked. He has never used smokeless tobacco. He reports that he does not drink alcohol and does not use drugs.   Family History:  His family history includes Cancer in his mother; Diabetes in his brother; Heart disease in his father. There is no history of Prostate cancer, Bladder Cancer, or Kidney cancer.   Allergies Allergies  Allergen Reactions  . Gabapentin Other (See Comments)    "made me fall out"     Home Medications  Prior to Admission medications   Medication Sig Start Date End Date Taking? Authorizing Provider  aspirin 81 MG tablet Take 81 mg by mouth daily.    Yes [provider]  atorvastatin (LIPITOR) 80 MG tablet Take 1 tablet (80 mg total) by mouth daily. 10/23/19  Yes Cannady, Jolene T, NP  fluticasone (FLONASE) 50 MCG/ACT nasal spray Place 2 sprays into both nostrils daily as needed.    Yes [provider]  insulin glargine (LANTUS) 100 UNIT/ML injection Inject 25 Units into the skin every evening. 1800   Yes [provider]  insulin lispro (HUMALOG KWIKPEN) 100 UNIT/ML KwikPen Inject 0.1 mLs (10 Units total) into the skin 3 (three) times daily before meals. Continue to check blood sugar after meals. 07/21/19  Yes Cannady, Jolene T, NP  lisinopril (ZESTRIL) 5 MG tablet Take 1 tablet (5 mg total) by mouth daily. 01/26/20  Yes Cannady,  Jolene T, NP  meloxicam (MOBIC) 15 MG tablet Take 15 mg by mouth daily. 04/12/20  Yes [provider]  metFORMIN (GLUCOPHAGE) 500 MG tablet Take 2 tablets (1,000 mg total) by mouth 2 (two) times daily with a meal. 1000mg  in the morning 1000mg  at night 05/17/20  Yes Cannady, Jolene T, NP  omeprazole (PRILOSEC) 20 MG capsule TAKE 1 CAPSULE EVERY DAY 11/06/19  Yes Cannady, Jolene T, NP  tamsulosin (FLOMAX) 0.4 MG CAPS capsule TAKE 1 CAPSULE EVERY DAY 05/20/20  Yes Cannady, Jolene T, NP  TRUE METRIX BLOOD GLUCOSE TEST test strip CHECK BLOOD SUGAR TWICE DAILY 12/11/19  Yes Cannady, Barbaraann Faster, NP  TRUEplus Lancets 33G MISC CHECK BLOOD SUGAR TWICE DAILY 10/04/19  Yes Venita Lick, NP     Critical care time: 45 minutes    [name], AGACNP-BC [facility] Pager: [phone]

## 2020-06-21 NOTE — ED Notes (Signed)
MD notified that patients respiratory status continues to decline, patient currently on HFNC with NRB, intermittent oxygen levels in 80s and RR rate 45. RT at bedside to eval.

## 2020-06-21 NOTE — Progress Notes (Signed)
[facility] Progress Note  Patient: Scott Lawson.    KZS:[id number]  DOA: 06/20/2020     Date of Service: the patient was seen and examined on 06/21/2020  Chief Complaint  Patient presents with  . Dizziness  . Hypoxia   Brief hospital course:  Scott Lawson. is a 80 y.o. male with medical history significant for diabetes, BPH, HTN, HLD, obesity and GERD, who presents to the emergency room with a 5-day history of generalized malaise associated with lightheadedness and then over the past 24 hours, started feeling short of breath.  He denies chest pain, fever or chills, vomiting or diarrhea or abdominal pain.  He is unvaccinated against COVID and states he is not interested in getting the vaccine ED Course: On arrival to the ED O2 sat was 48% on room air.  Temperature 99, BP 119/69 with respiratory rate 30 and O2 sat 48% on room air improving to 97% with nonrebreather mask at 15 L.  He tested positive for COVID with rapid test.  Blood work significant for creatinine of 1.69 up from baseline of 1.13 and BNP of 169.  Chest x-ray showing multifocal opacities.  EKG as interpreted by me: Normal sinus at 91. Patient continued to have increased work of breathing on NRB and he was subsequently placed on heated high flow and is currently on 50 L satting in the low to mid 90s.  Hospitalist consulted for admission.   Assessment and Plan: [name] Is a 80 year old male with history of diabetes, BPH, HTN, HLD, CKD 3A obesity and GERD, presenting with generalized malaise, lightheadedness and short of breath.    He is unvaccinated against COVID     Pneumonia due to COVID-19 virus   Acute respiratory failure due to COVID-19 [facility]) - Patient with shortness of breath, increased work of breathing speaking in short sentences O2 sat 48% on arrival  And then on heated high flow at 50 L to keep sats in the low 90s, and chest x-ray showing multifocal opacities - We will continue  remdesivir started in the emergency room though probably of little benefit at this time -Baricitinib, methylprednisolone, albuterol, antitussives and vitamins - Oxygen delivery system to maintain sats over 92% - Admit to stepdown Continue fluid restriction 1.5 L/day   Acute renal failure superimposed on stage 3a chronic kidney disease -creatinine 1.61 up from baseline of 1.13     Hyperglycemia due to type 2 diabetes mellitus (HCC) - Basal and Premeal insulin - Sliding scale insulin coverage    Generalized weakness related to [place] consult    Obesity (BMI 73-22.0) - Complicating factor to prognosis   Body mass index is 36.05 kg/m.     Diet: Heart healthy carb modified  DVT Prophylaxis: Subcutaneous Lovenox   Advance goals of care discussion: Full code  Family Communication: family was not present at bedside, at the time of interview.  The pt provided permission to discuss medical plan with the family. Opportunity was given to ask question and all questions were answered satisfactorily.   Disposition:  Pt is from Home, admitted with respiratory failure due to COVID-pneumonia, still has significant respiratory failure, which precludes a safe discharge. Discharge to HHPT vs [facility], pains after recovery, when respiratory failure resolves and recovers from COVID-19 pneumonia.  Subjective: Patient was admitted with severe hypoxic respiratory failure on heated high flow oxygen.  Patient was having significant shortness of breath, denied any chest pain or palpitations.  Denied any abdominal  pain, no nausea vomiting or diarrhea.   Physical Exam: General:  alert oriented to time, place, and person.  Appear in moderate distress, affect appropriate Eyes: PERRLA ENT: Oral Mucosa Clear, moist  Neck: no JVD,  Cardiovascular: S1 and S2 Present, no Murmur,  Respiratory: increased respiratory effort, Bilateral Air entry equal and Decreased, significant bilateral  crackles, mild wheezes Abdomen: Bowel Sound present, Soft and no tenderness,  Skin: no rashes Extremities: no Pedal edema, no calf tenderness Neurologic: without any new focal findings Gait not checked due to patient safety concerns  Vitals:   06/21/20 0830 06/21/20 1015 06/21/20 1200 06/21/20 1330  BP: 125/82 129/70 (!) 111/58 117/77  Pulse: 81 76 79 72  Resp: (!) 23 (!) 37 15 (!) 45  Temp:      TempSrc:      SpO2: 96% 96% 94% 92%  Weight:        Intake/Output Summary (Last 24 hours) at 06/21/2020 1423 Last data filed at 06/21/2020 1021 Gross per 24 hour  Intake --  Output 500 ml  Net -500 ml   Filed Weights   06/20/20 1944  Weight: 104.4 kg    Data Reviewed: I have personally reviewed and interpreted daily labs, tele strips, imagings as discussed above. I reviewed all nursing notes, pharmacy notes, vitals, pertinent old records I have discussed plan of care as described above with RN and patient/family.  CBC: Recent Labs  Lab 06/20/20 1931 06/21/20 0545  WBC 6.8 4.1  NEUTROABS  --  3.4  HGB 14.0 13.1  HCT 43.3 39.7  MCV 90.4 89.8  PLT 252 [id number]   Basic Metabolic Panel: Recent Labs  Lab 06/20/20 2056 06/21/20 0545  NA 137 137  K 4.2 4.5  CL 98 102  CO2 24 23  GLUCOSE 365* 422*  BUN 32* 35*  CREATININE 1.69* 1.58*  CALCIUM 8.4* 8.0*  MG  --  2.1  PHOS  --  2.8    Studies: DG Chest Portable 1 View  Result Date: 06/20/2020 CLINICAL DATA:  80 year old male with hypoxia. EXAM: PORTABLE CHEST 1 VIEW COMPARISON:  12/23/2017 FINDINGS: UPPER limits normal heart size noted. Bilateral interstitial/airspace opacities are noted. No large pleural effusion or pneumothorax noted. No acute bony abnormalities are identified. IMPRESSION: Bilateral interstitial/airspace opacities - question edema versus infection. Electronically Signed   By: Margarette Canada M.D.   On: 06/20/2020 20:25    Scheduled Meds: . albuterol  2 puff Inhalation Q6H  . vitamin C  500 mg Oral Daily  .  aspirin EC  81 mg Oral Daily  . atorvastatin  80 mg Oral Daily  . baricitinib  2 mg Oral Daily  . enoxaparin (LOVENOX) injection  0.5 mg/kg Subcutaneous Q24H  . insulin aspart  0-20 Units Subcutaneous TID WC  . insulin aspart  0-5 Units Subcutaneous QHS  . insulin aspart  10 Units Subcutaneous TID WC  . insulin detemir  24 Units Subcutaneous BID  . linagliptin  5 mg Oral Daily  . lisinopril  5 mg Oral Daily  . methylPREDNISolone (SOLU-MEDROL) injection  1 mg/kg Intravenous Q12H   Followed by  . [START ON 06/24/2020] predniSONE  50 mg Oral Daily  . tamsulosin  0.4 mg Oral Daily  . zinc sulfate  220 mg Oral Daily   Continuous Infusions: . remdesivir 100 mg in NS 100 mL Stopped (06/21/20 1125)   PRN Meds: acetaminophen, chlorpheniramine-HYDROcodone, guaiFENesin-dextromethorphan, ondansetron **OR** ondansetron (ZOFRAN) IV  Time spent: 35 minutes  Author: Val Riles. MD [facility] 06/21/2020 2:23 PM  To reach On-call, see care teams to locate the attending and reach out to them via [email or web address]. If 7PM-7AM, please contact night-coverage If you still have difficulty reaching the attending provider, please page the [facility] (Director on Call) for [facility] on amion for assistance.

## 2020-06-21 NOTE — ED Notes (Signed)
Obtained verbal order from EDP for 20 units insulin aspart to cover 0800 CBG of 479.

## 2020-06-22 DIAGNOSIS — U071 COVID-19: Secondary | ICD-10-CM | POA: Diagnosis not present

## 2020-06-22 DIAGNOSIS — J1282 Pneumonia due to coronavirus disease 2019: Secondary | ICD-10-CM | POA: Diagnosis not present

## 2020-06-22 LAB — CBC WITH DIFFERENTIAL/PLATELET
Abs Immature Granulocytes: 0.25 10*3/uL — ABNORMAL HIGH (ref 0.00–0.07)
Basophils Absolute: 0 10*3/uL (ref 0.0–0.1)
Basophils Relative: 0 %
Eosinophils Absolute: 0 10*3/uL (ref 0.0–0.5)
Eosinophils Relative: 0 %
HCT: 39.2 % (ref 39.0–52.0)
Hemoglobin: 12.9 g/dL — ABNORMAL LOW (ref 13.0–17.0)
Immature Granulocytes: 2 %
Lymphocytes Relative: 6 %
Lymphs Abs: 1 10*3/uL (ref 0.7–4.0)
MCH: 29.4 pg (ref 26.0–34.0)
MCHC: 32.9 g/dL (ref 30.0–36.0)
MCV: 89.3 fL (ref 80.0–100.0)
Monocytes Absolute: 0.7 10*3/uL (ref 0.1–1.0)
Monocytes Relative: 4 %
Neutro Abs: 14.3 10*3/uL — ABNORMAL HIGH (ref 1.7–7.7)
Neutrophils Relative %: 88 %
Platelets: 318 10*3/uL (ref 150–400)
RBC: 4.39 MIL/uL (ref 4.22–5.81)
RDW: 13.1 % (ref 11.5–15.5)
WBC: 16.3 10*3/uL — ABNORMAL HIGH (ref 4.0–10.5)
nRBC: 0.2 % (ref 0.0–0.2)

## 2020-06-22 LAB — COMPREHENSIVE METABOLIC PANEL
ALT: 26 U/L (ref 0–44)
AST: 66 U/L — ABNORMAL HIGH (ref 15–41)
Albumin: 2.8 g/dL — ABNORMAL LOW (ref 3.5–5.0)
Alkaline Phosphatase: 149 U/L — ABNORMAL HIGH (ref 38–126)
Anion gap: 11 (ref 5–15)
BUN: 43 mg/dL — ABNORMAL HIGH (ref 8–23)
CO2: 25 mmol/L (ref 22–32)
Calcium: 8.8 mg/dL — ABNORMAL LOW (ref 8.9–10.3)
Chloride: 106 mmol/L (ref 98–111)
Creatinine, Ser: 1.38 mg/dL — ABNORMAL HIGH (ref 0.61–1.24)
GFR, Estimated: 52 mL/min — ABNORMAL LOW (ref >60)
Glucose, Bld: 176 mg/dL — ABNORMAL HIGH (ref 70–99)
Potassium: 4.2 mmol/L (ref 3.5–5.1)
Sodium: 142 mmol/L (ref 135–145)
Total Bilirubin: 0.9 mg/dL (ref 0.3–1.2)
Total Protein: 6.9 g/dL (ref 6.5–8.1)

## 2020-06-22 LAB — CBG MONITORING, ED
Glucose-Capillary: 170 mg/dL — ABNORMAL HIGH (ref 70–99)
Glucose-Capillary: 140 mg/dL — ABNORMAL HIGH (ref 70–99)
Glucose-Capillary: 146 mg/dL — ABNORMAL HIGH (ref 70–99)
Glucose-Capillary: 126 mg/dL — ABNORMAL HIGH (ref 70–99)
Glucose-Capillary: 140 mg/dL — ABNORMAL HIGH (ref 70–99)
Glucose-Capillary: 147 mg/dL — ABNORMAL HIGH (ref 70–99)

## 2020-06-22 LAB — MAGNESIUM: Magnesium: 2.1 mg/dL (ref 1.7–2.4)

## 2020-06-22 LAB — D-DIMER, QUANTITATIVE: D-Dimer, Quant: >20 ug/mL-FEU — ABNORMAL HIGH (ref 0.00–0.50)

## 2020-06-22 LAB — C-REACTIVE PROTEIN: CRP: 13.6 mg/dL — ABNORMAL HIGH (ref <1.0)

## 2020-06-22 LAB — PHOSPHORUS: Phosphorus: 2.7 mg/dL (ref 2.5–4.6)

## 2020-06-22 MED ORDER — SODIUM CHLORIDE 0.9 % IV SOLN: Freq: Once | INTRAVENOUS | Status: AC

## 2020-06-22 NOTE — ED Notes (Signed)
Patient trying to eat breakfast without non re-breather. Oxygen drops to 81-83%. Increased RR

## 2020-06-22 NOTE — ED Notes (Signed)
Pt gave verbal okay to update his family member Customer service manager [phone]. Updated.

## 2020-06-22 NOTE — ED Notes (Signed)
Notified MD ouma patient has episodes of desturation into low 80s. Currently maintining o2 saturation at 89-92%

## 2020-06-22 NOTE — ED Notes (Signed)
Pt set up with breakfast tray.

## 2020-06-22 NOTE — Progress Notes (Signed)
   NAME:  Scott Palecek., MRN:  [id number], DOB:  17-May-1941, LOS: 2 ADMISSION DATE:  06/20/2020, CONSULTATION DATE:  06/21/2020 REFERRING MD:  Dr. Dwyane Dee, CHIEF COMPLAINT:  Hypoxia, SOB   Brief History:  80 y.o. unvaccinated male admitted with acute hypoxic respiratory failure in the setting of COVID-19 pneumonia.  Past Medical History:  Diabetes mellitus Hypertension Hyperlipidemia BPH  GERD Nocturia   Significant Hospital Events:  1/6: Presented to ED, to be admitted by hospitalist 1/7: Patient with increased work of breathing and high FiO2 requirements; PCCM consulted  Consults:  Hospitalist (primary service) PCCM   Procedures:  N/A  Significant Diagnostic Tests:  1/7: CXR>>Bilateral interstitial/airspace opacities - question edema versus Infection. 1/7: Venous US bilateral LE>> no DVT 1/7: Echocardiogram>>  Micro Data:  1/6: POC SARS-CoV-2 AG>> positive  Antimicrobials:  Remdesivir 1/6>>  Interim History / Subjective:   Remains on 6 L oxygen 900% SaO2.  States that breathing is stable No acute events overnight  Objective   Blood pressure 130/67, pulse 69, temperature 99 F (37.2 C), temperature source Oral, resp. rate (!) 29, weight 104.4 kg, SpO2 99 %.    FiO2 (%):  [100 %] 100 %   Intake/Output Summary (Last 24 hours) at 06/22/2020 1629 Last data filed at 06/22/2020 1527 Gross per 24 hour  Intake --  Output 725 ml  Net -725 ml   Filed Weights   06/20/20 1944  Weight: 104.4 kg    Examination: Gen:      No acute distress HEENT:  EOMI, sclera anicteric Neck:     No masses; no thyromegaly Lungs:   Scattered crackles CV:         Regular rate and rhythm; no murmurs Abd:      + bowel sounds; soft, non-tender; no palpable masses, no distension Ext:    No edema; adequate peripheral perfusion Skin:      Warm and dry; no rash Neuro: alert and oriented x 3 Psych: normal mood and affect  Labs/imaging reviewed Significant for BUN/creatinine 43/1.38,  AST 66 WBC 16.3 No new imaging  Resolved Hospital Problem list   N/A  Assessment & Plan:   Acute hypoxic respiratory failure in the setting of COVID-19 pneumonia Continue supplemental oxygen, wean as tolerated Monitor for intubation needs.  Currently does not require vent at this time Continue bronchodilators, remdesivir, IV steroids On baricitinib Awake proning, incentive spirometry, flutter valve Lower extremity duplex is negative for DVT.  Awaiting echocardiogram.  AKI superimposed on CKD stage III (Baseline Creatine 1.13) Monitor renal function   Diabetes Mellitus -CBG's -SSI -Levemir, Tradjenta.  Elevated transaminases Monitor while on remdesivir   Best practice (evaluated daily)  Diet: Heart healthy Pain/Anxiety/Delirium protocol (if indicated): N/A VAP protocol (if indicated): N/A DVT prophylaxis: Lovenox SQ GI prophylaxis: N/A Glucose control: SSI Mobility: As tolerated Disposition:Stepdown  Goals of Care:  Last date of multidisciplinary goals of care discussion: N/A Family and staff present: Discussed with patient at bedside Summary of discussion: Continued treatment with Remdesivir, steroids, Baricitinib, Vitamins.  Discussed he is requiring significant amount of O2, with high risk for deterioration which would require intubation.  He is in agreement with intubation if needed Follow up goals of care discussion due: 06/22/20 Code Status: Full Code  Critical care time: NA   Elin Seats MD  Pulmonary and Critical Care Please see [email or web address] for pager details.  06/22/2020, 4:29 PM

## 2020-06-22 NOTE — ED Notes (Signed)
Pt denies any needs.

## 2020-06-22 NOTE — ED Notes (Signed)
Patient is resting comfortably. 

## 2020-06-22 NOTE — ED Notes (Signed)
Pt given his lunch tray. Repositioned in bed.

## 2020-06-22 NOTE — Progress Notes (Signed)
[facility] Progress Note  Patient: Scott Lawson.    YW:[id number]  DOA: 06/20/2020     Date of Service: the patient was seen and examined on 06/22/2020  Chief Complaint  Patient presents with  . Dizziness  . Hypoxia   Brief hospital course:  Scott Bruins Tobey [name] is a 80 y.o. male with medical history significant for diabetes, BPH, HTN, HLD, obesity and GERD, who presents to the emergency room with a 5-day history of generalized malaise associated with lightheadedness and then over the past 24 hours, started feeling short of breath.  He denies chest pain, fever or chills, vomiting or diarrhea or abdominal pain.  He is unvaccinated against COVID and states he is not interested in getting the vaccine ED Course: On arrival to the ED O2 sat was 48% on room air.  Temperature 99, BP 119/69 with respiratory rate 30 and O2 sat 48% on room air improving to 97% with nonrebreather mask at 15 L.  He tested positive for COVID with rapid test.  Blood work significant for creatinine of 1.69 up from baseline of 1.13 and BNP of 169.  Chest x-ray showing multifocal opacities.  EKG as interpreted by me: Normal sinus at 91. Patient continued to have increased work of breathing on NRB and he was subsequently placed on heated high flow and is currently on 50 L satting in the low to mid 90s.  Hospitalist consulted for admission.   Assessment and Plan: Scott Lawson. Is a 80 year old male with history of diabetes, BPH, HTN, HLD, CKD 3A obesity and GERD, presenting with generalized malaise, lightheadedness and short of breath.    He is unvaccinated against COVID   Pneumonia due to COVID-19 virus Acute respiratory failure due to COVID-19  - Patient with shortness of breath, increased work of breathing speaking in short sentences O2 sat 48% on arrival  And then on heated high flow at 50 L to keep sats in the low 90s, and chest x-ray showing multifocal opacities -1/6--1/10 Remdesivir for 5 days as  per pharmacy  -Baricitinib for 14 days, methylprednisolone, albuterol, antitussives and vitamins - Oxygen delivery system to maintain sats over 92% Continue fluid restriction 1.5 L/day, patient seems to be dry cannot diurese further, BUN 43 creatinine 1.38 Pulmonary critical care is following, if condition deteriorates then patient may need to be intubated and transferred to ICU. Follow inflammatory markers   Acute renal failure superimposed on stage 3a chronic kidney disease -creatinine 1.61 up from baseline of 1.13 Cr 1.38  Continue monitor renal functions     Hyperglycemia due to type 2 diabetes mellitus (HCC) - Basal and Premeal insulin - Sliding scale insulin coverage    Generalized weakness related to COVID - Nutritionist consult    Obesity (BMI 0000000) - Complicating factor to prognosis   Body mass index is 36.05 kg/m.     Diet: Heart healthy carb modified  DVT Prophylaxis: Subcutaneous Lovenox   Advance goals of care discussion: Full code  Family Communication: family was not present at bedside, at the time of interview.  The pt provided permission to discuss medical plan with the family. Opportunity was given to ask question and all questions were answered satisfactorily.   Disposition:  Pt is from Home, admitted with respiratory failure due to COVID-pneumonia, still has significant respiratory failure, which precludes a safe discharge. Discharge to HHPT vs [facility], pains after recovery, when respiratory failure resolves and recovers from COVID-19 pneumonia.  Subjective: No significant overnight events,  patient feels improvement in the breathing but his supplemental O2 requirement has been maximized, patient is currently on heated high flow 50 L oxygen and 100% FiO2 Patient denies any chest pain or palpitations, seems to be resting comfortably.   Physical Exam: General:  alert oriented to time, place, and person.  Appear in moderate distress, affect  appropriate Eyes: PERRLA ENT: Oral Mucosa Clear, moist  Neck: no JVD,  Cardiovascular: S1 and S2 Present, no Murmur,  Respiratory: increased respiratory effort, Bilateral Air entry equal and Decreased, significant bilateral crackles, mild wheezes Abdomen: Bowel Sound present, Soft and no tenderness,  Skin: no rashes Extremities: no Pedal edema, no calf tenderness Neurologic: without any new focal findings Gait not checked due to patient safety concerns  Vitals:   06/22/20 1245 06/22/20 1300 06/22/20 1313 06/22/20 1330  BP:  116/69  133/73  Pulse: 77   77  Resp:   (!) 26   Temp:      TempSrc:      SpO2: 97%   94%  Weight:        Intake/Output Summary (Last 24 hours) at 06/22/2020 1515 Last data filed at 06/22/2020 1143 Gross per 24 hour  Intake --  Output 425 ml  Net -425 ml   Filed Weights   06/20/20 1944  Weight: 104.4 kg    Data Reviewed: I have personally reviewed and interpreted daily labs, tele strips, imagings as discussed above. I reviewed all nursing notes, pharmacy notes, vitals, pertinent old records I have discussed plan of care as described above with RN and patient/family.  CBC: Recent Labs  Lab 06/20/20 1931 06/21/20 0545 06/22/20 0600  WBC 6.8 4.1 16.3*  NEUTROABS  --  3.4 14.3*  HGB 14.0 13.1 12.9*  HCT 43.3 39.7 39.2  MCV 90.4 89.8 89.3  PLT 252 276 856   Basic Metabolic Panel: Recent Labs  Lab 06/20/20 2056 06/21/20 0545 06/22/20 0600  NA 137 137 142  K 4.2 4.5 4.2  CL 98 102 106  CO2 24 23 25   GLUCOSE 365* 422* 176*  BUN 32* 35* 43*  CREATININE 1.69* 1.58* 1.38*  CALCIUM 8.4* 8.0* 8.8*  MG  --  2.1 2.1  PHOS  --  2.8 2.7    Studies: US Venous Img Lower Bilateral (DVT)  Result Date: 06/21/2020 CLINICAL DATA:  80 year old male with elevated D-dimer, AKI, hypoxia, COVID-19 positive. EXAM: BILATERAL LOWER EXTREMITY VENOUS DOPPLER ULTRASOUND TECHNIQUE: Gray-scale sonography with graded compression, as well as color Doppler and duplex  ultrasound were performed to evaluate the lower extremity deep venous systems from the level of the common femoral vein and including the common femoral, femoral, profunda femoral, popliteal and calf veins including the posterior tibial, peroneal and gastrocnemius veins when visible. The superficial great saphenous vein was also interrogated. Spectral Doppler was utilized to evaluate flow at rest and with distal augmentation maneuvers in the common femoral, femoral and popliteal veins. COMPARISON:  None. FINDINGS: RIGHT LOWER EXTREMITY Common Femoral Vein: No evidence of thrombus. Normal compressibility, respiratory phasicity and response to augmentation. Saphenofemoral Junction: No evidence of thrombus. Normal compressibility and flow on color Doppler imaging. Profunda Femoral Vein: No evidence of thrombus. Normal compressibility and flow on color Doppler imaging. Femoral Vein: No evidence of thrombus. Normal compressibility, respiratory phasicity and response to augmentation. Popliteal Vein: No evidence of thrombus. Normal compressibility, respiratory phasicity and response to augmentation. Calf Veins: No evidence of thrombus. Normal compressibility and flow on color Doppler imaging. Superficial Great Saphenous Vein: No evidence  of thrombus. Normal compressibility. Other Findings:  None. LEFT LOWER EXTREMITY Common Femoral Vein: No evidence of thrombus. Normal compressibility, respiratory phasicity and response to augmentation. Saphenofemoral Junction: No evidence of thrombus. Normal compressibility and flow on color Doppler imaging. Profunda Femoral Vein: No evidence of thrombus. Normal compressibility and flow on color Doppler imaging. Femoral Vein: No evidence of thrombus. Normal compressibility, respiratory phasicity and response to augmentation. Popliteal Vein: No evidence of thrombus. Normal compressibility, respiratory phasicity and response to augmentation. Calf Veins: No evidence of thrombus. Normal  compressibility and flow on color Doppler imaging. Superficial Great Saphenous Vein: No evidence of thrombus. Normal compressibility. Other Findings:  None. IMPRESSION: No evidence of bilateral lower extremity deep vein thrombosis. Ruthann Cancer, MD [facility] Electronically Signed   By: Ruthann Cancer MD   On: 06/21/2020 16:50    Scheduled Meds: . albuterol  2 puff Inhalation Q6H  . vitamin C  500 mg Oral Daily  . aspirin EC  81 mg Oral Daily  . atorvastatin  80 mg Oral Daily  . baricitinib  2 mg Oral Daily  . enoxaparin (LOVENOX) injection  0.5 mg/kg Subcutaneous Q24H  . insulin aspart  0-20 Units Subcutaneous TID WC  . insulin aspart  0-5 Units Subcutaneous QHS  . insulin aspart  10 Units Subcutaneous TID WC  . insulin detemir  24 Units Subcutaneous BID  . linagliptin  5 mg Oral Daily  . lisinopril  5 mg Oral Daily  . methylPREDNISolone (SOLU-MEDROL) injection  1 mg/kg Intravenous Q12H   Followed by  . [START ON 06/24/2020] predniSONE  50 mg Oral Daily  . tamsulosin  0.4 mg Oral Daily  . zinc sulfate  220 mg Oral Daily   Continuous Infusions: . remdesivir 100 mg in NS 100 mL Stopped (06/22/20 1211)   PRN Meds: acetaminophen, chlorpheniramine-HYDROcodone, guaiFENesin-dextromethorphan, ondansetron **OR** ondansetron (ZOFRAN) IV  Time spent: 35 minutes  Author: Val Riles. MD [facility] 06/22/2020 3:15 PM  To reach On-call, see care teams to locate the attending and reach out to them via [email or web address]. If 7PM-7AM, please contact night-coverage If you still have difficulty reaching the attending provider, please page the [facility] (Director on Call) for [facility] on amion for assistance.

## 2020-06-23 ENCOUNTER — Inpatient Hospital Stay (HOSPITAL_COMMUNITY)
Admit: 2020-06-23 | Discharge: 2020-06-23 | Disposition: A | Discharge status: Home / Self Care | Payer: Medicare HMO | Attending: Pulmonary Disease | Admitting: Pulmonary Disease

## 2020-06-23 ENCOUNTER — Inpatient Hospital Stay: Admit: 2020-06-23 | Payer: Medicare HMO

## 2020-06-23 ENCOUNTER — Inpatient Hospital Stay: Payer: Medicare HMO

## 2020-06-23 DIAGNOSIS — J1282 Pneumonia due to coronavirus disease 2019: Secondary | ICD-10-CM | POA: Diagnosis not present

## 2020-06-23 DIAGNOSIS — I5021 Acute systolic (congestive) heart failure: Secondary | ICD-10-CM | POA: Diagnosis not present

## 2020-06-23 DIAGNOSIS — U071 COVID-19: Secondary | ICD-10-CM | POA: Diagnosis not present

## 2020-06-23 LAB — GLUCOSE, CAPILLARY
Glucose-Capillary: 146 mg/dL — ABNORMAL HIGH (ref 70–99)
Glucose-Capillary: 84 mg/dL (ref 70–99)
Glucose-Capillary: 84 mg/dL (ref 70–99)
Glucose-Capillary: 124 mg/dL — ABNORMAL HIGH (ref 70–99)
Glucose-Capillary: 204 mg/dL — ABNORMAL HIGH (ref 70–99)

## 2020-06-23 LAB — BLOOD GAS, ARTERIAL
Acid-Base Excess: 3.7 mmol/L — ABNORMAL HIGH (ref 0.0–2.0)
Bicarbonate: 27.7 mmol/L (ref 20.0–28.0)
FIO2: 1
O2 Saturation: 91.6 %
Patient temperature: 37
pCO2 arterial: 39 mmHg (ref 32.0–48.0)
pH, Arterial: 7.46 — ABNORMAL HIGH (ref 7.350–7.450)
pO2, Arterial: 59 mmHg — ABNORMAL LOW (ref 83.0–108.0)

## 2020-06-23 LAB — CBC WITH DIFFERENTIAL/PLATELET
Abs Immature Granulocytes: 0.24 10*3/uL — ABNORMAL HIGH (ref 0.00–0.07)
Basophils Absolute: 0 10*3/uL (ref 0.0–0.1)
Basophils Relative: 0 %
Eosinophils Absolute: 0 10*3/uL (ref 0.0–0.5)
Eosinophils Relative: 0 %
HCT: 40.5 % (ref 39.0–52.0)
Hemoglobin: 13.3 g/dL (ref 13.0–17.0)
Immature Granulocytes: 1 %
Lymphocytes Relative: 4 %
Lymphs Abs: 0.7 10*3/uL (ref 0.7–4.0)
MCH: 29.3 pg (ref 26.0–34.0)
MCHC: 32.8 g/dL (ref 30.0–36.0)
MCV: 89.2 fL (ref 80.0–100.0)
Monocytes Absolute: 1 10*3/uL (ref 0.1–1.0)
Monocytes Relative: 6 %
Neutro Abs: 15.4 10*3/uL — ABNORMAL HIGH (ref 1.7–7.7)
Neutrophils Relative %: 89 %
Platelets: 214 10*3/uL (ref 150–400)
RBC: 4.54 MIL/uL (ref 4.22–5.81)
RDW: 13.4 % (ref 11.5–15.5)
WBC: 17.3 10*3/uL — ABNORMAL HIGH (ref 4.0–10.5)
nRBC: 0.4 % — ABNORMAL HIGH (ref 0.0–0.2)

## 2020-06-23 LAB — C-REACTIVE PROTEIN: CRP: 7.1 mg/dL — ABNORMAL HIGH (ref <1.0)

## 2020-06-23 LAB — COMPREHENSIVE METABOLIC PANEL
ALT: 30 U/L (ref 0–44)
AST: 60 U/L — ABNORMAL HIGH (ref 15–41)
Albumin: 2.7 g/dL — ABNORMAL LOW (ref 3.5–5.0)
Alkaline Phosphatase: 220 U/L — ABNORMAL HIGH (ref 38–126)
Anion gap: 11 (ref 5–15)
BUN: 42 mg/dL — ABNORMAL HIGH (ref 8–23)
CO2: 25 mmol/L (ref 22–32)
Calcium: 8.6 mg/dL — ABNORMAL LOW (ref 8.9–10.3)
Chloride: 106 mmol/L (ref 98–111)
Creatinine, Ser: 1.3 mg/dL — ABNORMAL HIGH (ref 0.61–1.24)
GFR, Estimated: 56 mL/min — ABNORMAL LOW (ref >60)
Glucose, Bld: 101 mg/dL — ABNORMAL HIGH (ref 70–99)
Potassium: 3.9 mmol/L (ref 3.5–5.1)
Sodium: 142 mmol/L (ref 135–145)
Total Bilirubin: 1.3 mg/dL — ABNORMAL HIGH (ref 0.3–1.2)
Total Protein: 6.3 g/dL — ABNORMAL LOW (ref 6.5–8.1)

## 2020-06-23 LAB — PHOSPHORUS: Phosphorus: 3.3 mg/dL (ref 2.5–4.6)

## 2020-06-23 LAB — ECHOCARDIOGRAM COMPLETE
Area-P 1/2: 3.77 cm2
Height: 69 in
Height: 69 in
S' Lateral: 2.69 cm
Weight: 3439.18 oz
Weight: 3439.18 oz

## 2020-06-23 LAB — MRSA PCR SCREENING: MRSA by PCR: NEGATIVE

## 2020-06-23 LAB — D-DIMER, QUANTITATIVE: D-Dimer, Quant: >20 ug/mL-FEU — ABNORMAL HIGH (ref 0.00–0.50)

## 2020-06-23 LAB — MAGNESIUM: Magnesium: 2.2 mg/dL (ref 1.7–2.4)

## 2020-06-23 MED ORDER — FUROSEMIDE 10 MG/ML IJ SOLN
40.0000 mg | Freq: Once | INTRAMUSCULAR | Status: AC
  Administered 2020-06-23: 40 mg via INTRAVENOUS
  Filled 2020-06-23: qty 4

## 2020-06-23 MED ORDER — CHLORHEXIDINE GLUCONATE CLOTH 2 % EX PADS
6.0000 | MEDICATED_PAD | Freq: Every day | CUTANEOUS | Status: DC
  Administered 2020-06-23 – 2020-07-18 (×24): 6 via TOPICAL

## 2020-06-23 NOTE — Progress Notes (Signed)
NAME:  Scott Baltimore., MRN:  [id number], DOB:  [birth year], LOS: 3 ADMISSION DATE:  06/20/2020, CONSULTATION DATE:  06/21/2020 REFERRING MD:  Dr. Dwyane Dee, CHIEF COMPLAINT:  Hypoxia, SOB   Brief History:  80 y.o. unvaccinated male admitted with acute hypoxic respiratory failure in the setting of COVID-19 pneumonia.  Past Medical History:  Diabetes mellitus Hypertension Hyperlipidemia BPH  GERD Nocturia   Significant Hospital Events:  1/6: Presented to ED, to be admitted by hospitalist 1/7: Patient with increased work of breathing and high FiO2 requirements; PCCM consulted  Consults:  Hospitalist (primary service) PCCM   Procedures:  N/A  Significant Diagnostic Tests:  1/7: CXR>>Bilateral interstitial/airspace opacities - question edema versus Infection. 1/7: Venous US bilateral LE>> no DVT 1/7: Echocardiogram>>  Micro Data:  1/6: POC SARS-CoV-2 AG>> positive  Antimicrobials:  Remdesivir 1/6>>  Interim History / Subjective:   Remains on 100% high flow and 35 L oxygen.  Objective   Blood pressure 130/60, pulse (!) 59, temperature 97.6 F (36.4 C), temperature source Oral, resp. rate (!) 25, height 5\' 9"  (1.753 m), weight 97.5 kg, SpO2 96 %.    FiO2 (%):  [100 %] 100 %   Intake/Output Summary (Last 24 hours) at 06/23/2020 0747 Last data filed at 06/23/2020 0348 Gross per 24 hour  Intake 100 ml  Output 1375 ml  Net -1275 ml   Filed Weights   06/20/20 1944 06/23/20 0100  Weight: 104.4 kg 97.5 kg    Examination: Gen:      No acute distress HEENT:  EOMI, sclera anicteric Neck:     No masses; no thyromegaly Lungs:    Scattered crackle CV:         Regular rate and rhythm; no murmurs Abd:      + bowel sounds; soft, non-tender; no palpable masses, no distension Ext:    No edema; adequate peripheral perfusion Skin:      Warm and dry; no rash Neuro: alert and oriented x 3 Psych: normal mood and affect  Labs/imaging reviewed Labs are pending Chest x-ray  today reviewed with cardiomegaly, bilateral airspace disease  Resolved Hospital Problem list   N/A  Assessment & Plan:   Acute hypoxic respiratory failure in the setting of COVID-19 pneumonia Continue supplemental oxygen, wean as tolerated Monitor for intubation needs.  Currently does not require vent at this time Continue bronchodilators, remdesivir, IV steroids On baricitinib Awake proning, incentive spirometry, flutter valve Lower extremity duplex is negative for DVT.    CHF Chest x-ray looks like there is a component of CHF Lasix 40 mg x 1 Echocardiogram reordered  AKI superimposed on CKD stage III (Baseline Creatine 1.13) Monitor renal function   Diabetes Mellitus -CBG's -SSI -Levemir, Tradjenta.  Elevated transaminases Monitor while on remdesivir   Best practice (evaluated daily)  Diet: Heart healthy Pain/Anxiety/Delirium protocol (if indicated): N/A VAP protocol (if indicated): N/A DVT prophylaxis: Lovenox SQ GI prophylaxis: N/A Glucose control: SSI Mobility: As tolerated Disposition:Stepdown  Goals of Care:  Last date of multidisciplinary goals of care discussion: N/A Family and staff present: Discussed with patient at bedside Summary of discussion: Continued treatment with Remdesivir, steroids, Baricitinib, Vitamins.  Discussed he is requiring significant amount of O2, with high risk for deterioration which would require intubation.  He is in agreement with intubation if needed Follow up goals of care discussion due: 06/29/20 Code Status: Full Code  Critical care time:    The patient is critically ill with multiple organ system failure and requires high  complexity decision making for assessment and support, frequent evaluation and titration of therapies, advanced monitoring, review of radiographic studies and interpretation of complex data.   Critical Care Time devoted to patient care services, exclusive of separately billable procedures, described in  this note is 35 minutes.   Marshell Garfinkel MD [facility] Please see [email or web address] for pager details.  06/23/2020, 7:54 AM

## 2020-06-23 NOTE — Progress Notes (Signed)
Pt A&OX4 and symmetrical. Denies pain. Pt on heated HFNC and non-re breather sating in upper 90's. Non-re breather removed and pt sat's 92-94%. Will continue to monitor.

## 2020-06-23 NOTE — ED Notes (Signed)
Pt taken to floor with RN and RT on monitor via stretcher

## 2020-06-23 NOTE — Plan of Care (Signed)
  Problem: Clinical Measurements: Goal: Will remain free from infection Outcome: Progressing Goal: Respiratory complications will improve Outcome: Progressing   Problem: Activity: Goal: Risk for activity intolerance will decrease Outcome: Progressing   Problem: Safety: Goal: Ability to remain free from injury will improve Outcome: Progressing   

## 2020-06-23 NOTE — Progress Notes (Signed)
[facility] Progress Note  Patient: Scott Lawson.    Scott Lawson:[id number]  DOA: 06/20/2020     Date of Service: the patient was seen and examined on 06/23/2020  Chief Complaint  Patient presents with  . Dizziness  . Hypoxia   Brief hospital course:  Scott Lawson. is a 80 y.o. male with medical history significant for diabetes, BPH, HTN, HLD, obesity and GERD, who presents to the emergency room with a 5-day history of generalized malaise associated with lightheadedness and then over the past 24 hours, started feeling short of breath.  He denies chest pain, fever or chills, vomiting or diarrhea or abdominal pain.  He is unvaccinated against COVID and states he is not interested in getting the vaccine ED Course: On arrival to the ED O2 sat was 48% on room air.  Temperature 99, BP 119/69 with respiratory rate 30 and O2 sat 48% on room air improving to 97% with nonrebreather mask at 15 L.  He tested positive for COVID with rapid test.  Blood work significant for creatinine of 1.69 up from baseline of 1.13 and BNP of 169.  Chest x-ray showing multifocal opacities.  EKG as interpreted by me: Normal sinus at 91. Patient continued to have increased work of breathing on NRB and he was subsequently placed on heated high flow and is currently on 50 L satting in the low to mid 90s.  Hospitalist consulted for admission.   Assessment and Plan: Scott Lawson. Is a 80 year old male with history of diabetes, BPH, HTN, HLD, CKD 3A obesity and GERD, presenting with generalized malaise, lightheadedness and short of breath.    He is unvaccinated against COVID   Pneumonia due to COVID-19 virus Acute respiratory failure due to COVID-19  - Patient with shortness of breath, increased work of breathing speaking in short sentences O2 sat 48% on arrival  And then on heated high flow at 50 L to keep sats in the low 90s, and chest x-ray showing multifocal opacities -1/6--1/10 Remdesivir for 5 days as  per pharmacy  -Baricitinib for 14 days, methylprednisolone, albuterol, antitussives and vitamins - Oxygen delivery system to maintain sats over 92% Continue fluid restriction 1.5 L/day, patient seems to be dry cannot diurese further, BUN 43 creatinine 1.38 Patient was transferred in the ICU, pulmonary critical care is following, if condition deteriorates then patient may need to be intubated Follow inflammatory markers Currently patient is on 55 L 100% FiO2  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     Diet: Heart healthy carb modified  DVT Prophylaxis: Subcutaneous Lovenox   Advance goals of care discussion: Full code  Family Communication: family was not present at bedside, at the time of interview.  The pt provided permission to discuss medical plan with the family. Opportunity was given to ask question and all questions were answered satisfactorily.   Disposition:  Pt is from Home, admitted with respiratory failure due to COVID-pneumonia, still has significant respiratory failure, which precludes a safe discharge. Discharge to HHPT vs [facility], after recovery, when respiratory failure resolves and recovers from  COVID-19 pneumonia. 1/9 Currently patient is on 55 L 100% FiO2   Subjective: No significant overnight events, patient feels improvement in the breathing but his supplemental O2 requirement has been maximized, patient is currently on heated high flow 55 L oxygen and 100% FiO2 Patient denies any chest pain or palpitations, seems to be resting comfortably.   Physical Exam: General:   alert oriented to time, place, and person.  Appear in moderate distress, affect appropriate Eyes: PERRLA ENT: Oral Mucosa Clear, moist  Neck: no JVD,  Cardiovascular: S1 and S2 Present, no Murmur,  Respiratory: increased respiratory effort, Bilateral Air entry equal and Decreased, significant bilateral crackles, mild wheezes Abdomen: Bowel Sound present, Soft and no tenderness,  Skin: no rashes Extremities: no Pedal edema, no calf tenderness Neurologic: without any new focal findings Gait not checked due to patient safety concerns  Vitals:   06/23/20 1100 06/23/20 1150 06/23/20 1300 06/23/20 1400  BP:  117/64 (!) 108/54 123/65  Pulse: (!) 59 (!) 55 76 (!) 58  Resp: (!) 26 (!) 26 11 20   Temp:  (!) 97.5 F (36.4 C)    TempSrc:  Axillary    SpO2:  99% 100% 97%  Weight:      Height:        Intake/Output Summary (Last 24 hours) at 06/23/2020 1424 Last data filed at 06/23/2020 1400 Gross per 24 hour  Intake 200 ml  Output 3000 ml  Net -2800 ml   Filed Weights   06/20/20 1944 06/23/20 0100  Weight: 104.4 kg 97.5 kg    Data Reviewed: I have personally reviewed and interpreted daily labs, tele strips, imagings as discussed above. I reviewed all nursing notes, pharmacy notes, vitals, pertinent old records I have discussed plan of care as described above with RN and patient/family.  CBC: Recent Labs  Lab 06/20/20 1931 06/21/20 0545 06/22/20 0600 06/23/20 0705  WBC 6.8 4.1 16.3* 17.3*  NEUTROABS  --  3.4 14.3* 15.4*  HGB 14.0 13.1 12.9* 13.3  HCT 43.3 39.7 39.2 40.5  MCV 90.4 89.8 89.3 89.2  PLT 252 276 318 [id number]   Basic Metabolic Panel: Recent Labs  Lab 06/20/20 2056 06/21/20 0545 06/22/20 0600 06/23/20 0705  NA 137 137 142 142  K 4.2 4.5 4.2 3.9  CL 98 102 106 106  CO2 24 23 25 25   GLUCOSE 365* 422* 176* 101*  BUN 32* 35* 43* 42*  CREATININE 1.69* 1.58* 1.38* 1.30*  CALCIUM 8.4* 8.0* 8.8* 8.6*  MG  --  2.1 2.1 2.2  PHOS  --  2.8 2.7 3.3    Studies: DG  Chest Port 1 View  Result Date: 06/23/2020 CLINICAL DATA:  Acute respiratory failure.  COVID. EXAM: PORTABLE CHEST 1 VIEW COMPARISON:  Three days ago FINDINGS: Cardiomegaly. Low lung volumes accentuated by apical lordotic positioning. Diffuse pulmonary opacification. No visible effusion or air leak. IMPRESSION: Stable low volume chest with diffuse opacification. There is cardiomegaly and symmetric opacity, edema could contribute to the presumed COVID pneumonia. Electronically Signed   By: Monte Fantasia M.D.   On: 06/23/2020 07:18    Scheduled Meds: . albuterol  2 puff Inhalation Q6H  . vitamin C  500 mg Oral Daily  . aspirin EC  81 mg Oral Daily  . atorvastatin  80 mg Oral Daily  . baricitinib  2 mg Oral Daily  . Chlorhexidine Gluconate Cloth  6 each Topical Daily  . enoxaparin (LOVENOX) injection  0.5 mg/kg Subcutaneous Q24H  . insulin aspart  0-20  Units Subcutaneous TID WC  . insulin aspart  0-5 Units Subcutaneous QHS  . insulin aspart  10 Units Subcutaneous TID WC  . insulin detemir  24 Units Subcutaneous BID  . linagliptin  5 mg Oral Daily  . lisinopril  5 mg Oral Daily  . methylPREDNISolone (SOLU-MEDROL) injection  1 mg/kg Intravenous Q12H   Followed by  . [START ON 06/24/2020] predniSONE  50 mg Oral Daily  . tamsulosin  0.4 mg Oral Daily  . zinc sulfate  220 mg Oral Daily   Continuous Infusions: . remdesivir 100 mg in NS 100 mL Stopped (06/23/20 1026)   PRN Meds: acetaminophen, chlorpheniramine-HYDROcodone, guaiFENesin-dextromethorphan, ondansetron **OR** ondansetron (ZOFRAN) IV  Time spent: 35 minutes  Author: Val Riles. MD [facility] 06/23/2020 2:24 PM  To reach On-call, see care teams to locate the attending and reach out to them via [email or web address]. If 7PM-7AM, please contact night-coverage If you still have difficulty reaching the attending provider, please page the [facility] (Director on Call) for [facility] on amion for assistance.

## 2020-06-23 NOTE — ED Notes (Signed)
Report called to CCU nurse [facility]

## 2020-06-24 ENCOUNTER — Inpatient Hospital Stay: Payer: Medicare HMO

## 2020-06-24 DIAGNOSIS — U071 COVID-19: Secondary | ICD-10-CM | POA: Diagnosis not present

## 2020-06-24 DIAGNOSIS — J1282 Pneumonia due to coronavirus disease 2019: Secondary | ICD-10-CM | POA: Diagnosis not present

## 2020-06-24 LAB — COMPREHENSIVE METABOLIC PANEL
ALT: 30 U/L (ref 0–44)
AST: 47 U/L — ABNORMAL HIGH (ref 15–41)
Albumin: 2.9 g/dL — ABNORMAL LOW (ref 3.5–5.0)
Alkaline Phosphatase: 226 U/L — ABNORMAL HIGH (ref 38–126)
Anion gap: 15 (ref 5–15)
BUN: 47 mg/dL — ABNORMAL HIGH (ref 8–23)
CO2: 29 mmol/L (ref 22–32)
Calcium: 8.5 mg/dL — ABNORMAL LOW (ref 8.9–10.3)
Chloride: 97 mmol/L — ABNORMAL LOW (ref 98–111)
Creatinine, Ser: 1.33 mg/dL — ABNORMAL HIGH (ref 0.61–1.24)
GFR, Estimated: 55 mL/min — ABNORMAL LOW (ref >60)
Glucose, Bld: 110 mg/dL — ABNORMAL HIGH (ref 70–99)
Potassium: 3.9 mmol/L (ref 3.5–5.1)
Sodium: 141 mmol/L (ref 135–145)
Total Bilirubin: 1.6 mg/dL — ABNORMAL HIGH (ref 0.3–1.2)
Total Protein: 6.7 g/dL (ref 6.5–8.1)

## 2020-06-24 LAB — CBC WITH DIFFERENTIAL/PLATELET
Abs Immature Granulocytes: 0.12 10*3/uL — ABNORMAL HIGH (ref 0.00–0.07)
Basophils Absolute: 0 10*3/uL (ref 0.0–0.1)
Basophils Relative: 0 %
Eosinophils Absolute: 0 10*3/uL (ref 0.0–0.5)
Eosinophils Relative: 0 %
HCT: 42.8 % (ref 39.0–52.0)
Hemoglobin: 14.7 g/dL (ref 13.0–17.0)
Immature Granulocytes: 1 %
Lymphocytes Relative: 4 %
Lymphs Abs: 0.6 10*3/uL — ABNORMAL LOW (ref 0.7–4.0)
MCH: 30.4 pg (ref 26.0–34.0)
MCHC: 34.3 g/dL (ref 30.0–36.0)
MCV: 88.4 fL (ref 80.0–100.0)
Monocytes Absolute: 1 10*3/uL (ref 0.1–1.0)
Monocytes Relative: 6 %
Neutro Abs: 14.7 10*3/uL — ABNORMAL HIGH (ref 1.7–7.7)
Neutrophils Relative %: 89 %
Platelets: 177 10*3/uL (ref 150–400)
RBC: 4.84 MIL/uL (ref 4.22–5.81)
RDW: 13.2 % (ref 11.5–15.5)
WBC: 16.4 10*3/uL — ABNORMAL HIGH (ref 4.0–10.5)
nRBC: 0.3 % — ABNORMAL HIGH (ref 0.0–0.2)

## 2020-06-24 LAB — PHOSPHORUS: Phosphorus: 3.5 mg/dL (ref 2.5–4.6)

## 2020-06-24 LAB — GLUCOSE, CAPILLARY
Glucose-Capillary: 102 mg/dL — ABNORMAL HIGH (ref 70–99)
Glucose-Capillary: 135 mg/dL — ABNORMAL HIGH (ref 70–99)
Glucose-Capillary: 165 mg/dL — ABNORMAL HIGH (ref 70–99)
Glucose-Capillary: 173 mg/dL — ABNORMAL HIGH (ref 70–99)
Glucose-Capillary: 218 mg/dL — ABNORMAL HIGH (ref 70–99)
Glucose-Capillary: 94 mg/dL (ref 70–99)

## 2020-06-24 LAB — MAGNESIUM: Magnesium: 2.2 mg/dL (ref 1.7–2.4)

## 2020-06-24 LAB — D-DIMER, QUANTITATIVE: D-Dimer, Quant: >20 ug/mL-FEU — ABNORMAL HIGH (ref 0.00–0.50)

## 2020-06-24 LAB — C-REACTIVE PROTEIN: CRP: 5.5 mg/dL — ABNORMAL HIGH (ref <1.0)

## 2020-06-24 MED ORDER — SALINE SPRAY 0.65 % NA SOLN
1.0000 | NASAL | Status: DC | PRN
  Administered 2020-06-29 – 2020-07-18 (×2): 1 via NASAL
  Filled 2020-06-24 (×2): qty 44

## 2020-06-24 MED ORDER — LOPERAMIDE HCL 2 MG PO CAPS
2.0000 mg | ORAL_CAPSULE | ORAL | Status: DC | PRN
  Administered 2020-06-24 – 2020-06-25 (×2): 2 mg via ORAL
  Filled 2020-06-24 (×2): qty 1

## 2020-06-24 NOTE — Progress Notes (Signed)
[facility] Progress Note  Patient: Scott Lawson.    TO:[id number]  DOA: 06/20/2020     Date of Service: the patient was seen and examined on 06/24/2020  Chief Complaint  Patient presents with  . Dizziness  . Hypoxia   Brief hospital course:  Princess Bruins Kawamoto [name] is a 80 y.o. male with medical history significant for diabetes, BPH, HTN, HLD, obesity and GERD, who presents to the emergency room with a 5-day history of generalized malaise associated with lightheadedness and then over the past 24 hours, started feeling short of breath.  He denies chest pain, fever or chills, vomiting or diarrhea or abdominal pain.  He is unvaccinated against COVID and states he is not interested in getting the vaccine ED Course: On arrival to the ED O2 sat was 48% on room air.  Temperature 99, BP 119/69 with respiratory rate 30 and O2 sat 48% on room air improving to 97% with nonrebreather mask at 15 L.  He tested positive for COVID with rapid test.  Blood work significant for creatinine of 1.69 up from baseline of 1.13 and BNP of 169.  Chest x-ray showing multifocal opacities.  EKG as interpreted by me: Normal sinus at 91. Patient continued to have increased work of breathing on NRB and he was subsequently placed on heated high flow.  currently on 60 L and FiO 100%, PCCM following    Assessment and Plan: [name] Is a 80 year old male with history of diabetes, BPH, HTN, HLD, CKD 3A obesity and GERD, presenting with generalized malaise, lightheadedness and short of breath.    He is unvaccinated against COVID   Pneumonia due to COVID-19 virus Acute respiratory failure due to COVID-19  - Patient with shortness of breath, increased work of breathing speaking in short sentences O2 sat 48% on arrival  And then on heated high flow at 50 L to keep sats in the low 90s, and chest x-ray showing multifocal opacities -1/6--1/10 Remdesivir for 5 days as per pharmacy  -Baricitinib for 14 days,  methylprednisolone, albuterol, antitussives and vitamins - Oxygen delivery system to maintain sats over 92% Continue fluid restriction 1.5 L/day, patient seems to be dry cannot diurese further, BUN 43 creatinine 1.38 Patient was transferred in the ICU, pulmonary critical care is following, if condition deteriorates then patient may need to be intubated Follow inflammatory markers Currently patient is on 60 L 100% FiO2  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     Diet: Heart healthy carb modified  DVT Prophylaxis: Subcutaneous Lovenox   Advance goals of care discussion: Full code  Family Communication: family was not present at bedside, at the time of interview.  The pt provided permission to discuss medical plan with the family. Opportunity was given to ask question and all questions were answered satisfactorily.   Disposition:  Pt is from Home, admitted with respiratory failure due to COVID-pneumonia, still has significant respiratory failure, which precludes a safe discharge. Discharge to HHPT vs [facility], after recovery, when respiratory failure resolves and recovers from COVID-19 pneumonia. 1/10 Currently patient is on 21  L 100% FiO2   Subjective: No significant overnight events, patient feels improvement in the breathing but his supplemental O2 requirement has been maximized, patient is currently on heated high flow 60 L oxygen and 100% FiO2 Patient denies any chest pain or palpitations, seems to be resting comfortably.   Physical Exam: General:  alert oriented to time, place, and  person.  Appear in moderate distress, affect appropriate Eyes: PERRLA ENT: Oral Mucosa Clear, moist  Neck: no JVD,  Cardiovascular: S1 and S2 Present, no Murmur,  Respiratory: increased respiratory effort, Bilateral Air entry equal and Decreased, significant bilateral crackles, mild wheezes Abdomen: Bowel Sound present, Soft and no tenderness,  Skin: no rashes Extremities: no Pedal edema, no calf tenderness Neurologic: without any new focal findings Gait not checked due to patient safety concerns  Vitals:   06/24/20 1100 06/24/20 1129 06/24/20 1200 06/24/20 1416  BP: (!) 105/56  97/73   Pulse: 61  (!) 54   Resp: (!) 23  (!) 23   Temp:      TempSrc:      SpO2: 99% 95% 99% 92%  Weight:      Height:        Intake/Output Summary (Last 24 hours) at 06/24/2020 1511 Last data filed at 06/24/2020 1100 Gross per 24 hour  Intake --  Output 800 ml  Net -800 ml   Filed Weights   06/20/20 1944 06/23/20 0100  Weight: 104.4 kg 97.5 kg    Data Reviewed: I have personally reviewed and interpreted daily labs, tele strips, imagings as discussed above. I reviewed all nursing notes, pharmacy notes, vitals, pertinent old records I have discussed plan of care as described above with RN and patient/family.  CBC: Recent Labs  Lab 06/20/20 1931 06/21/20 0545 06/22/20 0600 06/23/20 0705 06/24/20 0259  WBC 6.8 4.1 16.3* 17.3* 16.4*  NEUTROABS  --  3.4 14.3* 15.4* 14.7*  HGB 14.0 13.1 12.9* 13.3 14.7  HCT 43.3 39.7 39.2 40.5 42.8  MCV 90.4 89.8 89.3 89.2 88.4  PLT 252 276 318 214 774   Basic Metabolic Panel: Recent Labs  Lab 06/20/20 2056 06/21/20 0545 06/22/20 0600 06/23/20 0705 06/24/20 0259  NA 137 137 142 142 141  K 4.2 4.5 4.2 3.9 3.9  CL 98 102 106 106 97*  CO2 24 23 25 25 29   GLUCOSE 365* 422* 176* 101* 110*  BUN 32* 35* 43* 42* 47*  CREATININE 1.69* 1.58* 1.38* 1.30* 1.33*  CALCIUM 8.4* 8.0* 8.8* 8.6* 8.5*  MG  --  2.1 2.1 2.2 2.2  PHOS  --  2.8 2.7 3.3 3.5     Studies: DG Chest Port 1 View  Result Date: 06/24/2020 CLINICAL DATA:  Shortness of breath EXAM: PORTABLE CHEST 1 VIEW COMPARISON:  Yesterday FINDINGS: Cardiopericardial enlargement. Diffuse interstitial opacity and hazy airspace density. No visible effusion or pneumothorax. IMPRESSION: Stable low volume chest with diffuse infectious or congestive opacity. Electronically Signed   By: Monte Fantasia M.D.   On: 06/24/2020 05:48    Scheduled Meds: . albuterol  2 puff Inhalation Q6H  . vitamin C  500 mg Oral Daily  . aspirin EC  81 mg Oral Daily  . atorvastatin  80 mg Oral Daily  . baricitinib  2 mg Oral Daily  . Chlorhexidine Gluconate Cloth  6 each Topical Daily  . enoxaparin (LOVENOX) injection  0.5 mg/kg Subcutaneous Q24H  . insulin aspart  0-20 Units Subcutaneous TID WC  . insulin aspart  0-5 Units Subcutaneous QHS  .  insulin aspart  10 Units Subcutaneous TID WC  . insulin detemir  24 Units Subcutaneous BID  . linagliptin  5 mg Oral Daily  . lisinopril  5 mg Oral Daily  . predniSONE  50 mg Oral Daily  . tamsulosin  0.4 mg Oral Daily  . zinc sulfate  220 mg Oral Daily   Continuous Infusions:  PRN Meds: acetaminophen, chlorpheniramine-HYDROcodone, guaiFENesin-dextromethorphan, ondansetron **OR** ondansetron (ZOFRAN) IV, sodium chloride  Time spent: 35 minutes  Author: Val Riles. MD [facility] 06/24/2020 3:11 PM  To reach On-call, see care teams to locate the attending and reach out to them via [email or web address]. If 7PM-7AM, please contact night-coverage If you still have difficulty reaching the attending provider, please page the [facility] (Director on Call) for [facility] on amion for assistance.

## 2020-06-24 NOTE — Progress Notes (Signed)
Pt remains a/o x4. Mid shift, pt began desat'ing on 50L 90%. Pt increased to 60L 100% + NRB added. Attempt to wean pt off NRB this AM w/o success, pt sat'ing 80s.  Pt voiding independently.   Pt in no acute distress @ this time. Will continue to monitor.

## 2020-06-24 NOTE — Progress Notes (Signed)
Pt A&OX4, symmetrical. Denies pain. Turning self on side or prone between meals. Unable to Tolerate meals without eating slow and placing non-re breather on between bites.

## 2020-06-25 DIAGNOSIS — J1282 Pneumonia due to coronavirus disease 2019: Secondary | ICD-10-CM | POA: Diagnosis not present

## 2020-06-25 DIAGNOSIS — U071 COVID-19: Secondary | ICD-10-CM | POA: Diagnosis not present

## 2020-06-25 LAB — CBC WITH DIFFERENTIAL/PLATELET
Abs Immature Granulocytes: 0.2 10*3/uL — ABNORMAL HIGH (ref 0.00–0.07)
Basophils Absolute: 0 10*3/uL (ref 0.0–0.1)
Basophils Relative: 0 %
Eosinophils Absolute: 0 10*3/uL (ref 0.0–0.5)
Eosinophils Relative: 0 %
HCT: 43.5 % (ref 39.0–52.0)
Hemoglobin: 14.6 g/dL (ref 13.0–17.0)
Immature Granulocytes: 1 %
Lymphocytes Relative: 12 %
Lymphs Abs: 2.2 10*3/uL (ref 0.7–4.0)
MCH: 29.7 pg (ref 26.0–34.0)
MCHC: 33.6 g/dL (ref 30.0–36.0)
MCV: 88.4 fL (ref 80.0–100.0)
Monocytes Absolute: 0.9 10*3/uL (ref 0.1–1.0)
Monocytes Relative: 5 %
Neutro Abs: 14.4 10*3/uL — ABNORMAL HIGH (ref 1.7–7.7)
Neutrophils Relative %: 82 %
Platelets: 200 10*3/uL (ref 150–400)
RBC: 4.92 MIL/uL (ref 4.22–5.81)
RDW: 13.1 % (ref 11.5–15.5)
WBC: 17.7 10*3/uL — ABNORMAL HIGH (ref 4.0–10.5)
nRBC: 0 % (ref 0.0–0.2)

## 2020-06-25 LAB — GLUCOSE, CAPILLARY
Glucose-Capillary: 48 mg/dL — ABNORMAL LOW (ref 70–99)
Glucose-Capillary: 86 mg/dL (ref 70–99)
Glucose-Capillary: 91 mg/dL (ref 70–99)
Glucose-Capillary: 147 mg/dL — ABNORMAL HIGH (ref 70–99)
Glucose-Capillary: 90 mg/dL (ref 70–99)

## 2020-06-25 LAB — COMPREHENSIVE METABOLIC PANEL
ALT: 29 U/L (ref 0–44)
AST: 33 U/L (ref 15–41)
Albumin: 2.7 g/dL — ABNORMAL LOW (ref 3.5–5.0)
Alkaline Phosphatase: 184 U/L — ABNORMAL HIGH (ref 38–126)
Anion gap: 14 (ref 5–15)
BUN: 47 mg/dL — ABNORMAL HIGH (ref 8–23)
CO2: 28 mmol/L (ref 22–32)
Calcium: 8.4 mg/dL — ABNORMAL LOW (ref 8.9–10.3)
Chloride: 99 mmol/L (ref 98–111)
Creatinine, Ser: 1.33 mg/dL — ABNORMAL HIGH (ref 0.61–1.24)
GFR, Estimated: 55 mL/min — ABNORMAL LOW (ref >60)
Glucose, Bld: 40 mg/dL — CL (ref 70–99)
Potassium: 3.9 mmol/L (ref 3.5–5.1)
Sodium: 141 mmol/L (ref 135–145)
Total Bilirubin: 1.2 mg/dL (ref 0.3–1.2)
Total Protein: 6.2 g/dL — ABNORMAL LOW (ref 6.5–8.1)

## 2020-06-25 LAB — D-DIMER, QUANTITATIVE: D-Dimer, Quant: >20 ug/mL-FEU — ABNORMAL HIGH (ref 0.00–0.50)

## 2020-06-25 LAB — C-REACTIVE PROTEIN: CRP: 2.6 mg/dL — ABNORMAL HIGH (ref <1.0)

## 2020-06-25 MED ORDER — INSULIN DETEMIR 100 UNIT/ML ~~LOC~~ SOLN
15.0000 [IU] | Freq: Two times a day (BID) | SUBCUTANEOUS | Status: DC
  Administered 2020-06-25: 15 [IU] via SUBCUTANEOUS
  Filled 2020-06-25 (×4): qty 0.15

## 2020-06-25 MED ORDER — DEXTROSE 50 % IV SOLN
1.0000 | Freq: Once | INTRAVENOUS | Status: AC
  Administered 2020-06-25: 50 mL via INTRAVENOUS

## 2020-06-25 MED ORDER — DEXTROSE 50 % IV SOLN
INTRAVENOUS | Status: AC
  Filled 2020-06-25: qty 50

## 2020-06-25 MED ORDER — ALPRAZOLAM 0.25 MG PO TABS
0.2500 mg | ORAL_TABLET | Freq: Three times a day (TID) | ORAL | Status: DC | PRN
  Administered 2020-06-25 – 2020-07-18 (×11): 0.25 mg via ORAL
  Filled 2020-06-25 (×11): qty 1

## 2020-06-25 MED ORDER — FUROSEMIDE 10 MG/ML IJ SOLN
20.0000 mg | Freq: Once | INTRAMUSCULAR | Status: AC
  Administered 2020-06-25: 20 mg via INTRAVENOUS
  Filled 2020-06-25: qty 2

## 2020-06-25 NOTE — Progress Notes (Signed)
Better day HFNC now at 65%. Uses 100% NRB prn. Drops oxygen saturations with extreme movement. More pleasant after Xanax.

## 2020-06-25 NOTE — Progress Notes (Signed)
PROGRESS NOTE    Scott Lawson.  YW:[id number] DOB: 09/16/1940 DOA: 06/20/2020 PCP: Venita Lick, NP    Brief Narrative:  80 y.o.malewith medical history significant fordiabetes, BPH, HTN, HLD, obesity and GERD, who presents to the emergency room with a5-dayhistory of generalized malaise associated with lightheadedness and thenover the past 24 hours,started feeling short of breath. He denies chest pain, fever or chills, vomiting or diarrhea or abdominal pain. He is unvaccinated against COVIDand states he is not interested in getting the vaccine  Patient remains on heated high flow nasal cannula 50 L at 65%.  Speaks in complete sentences without dyspnea.   Assessment & Plan:   Principal Problem:   Pneumonia due to COVID-19 virus Active Problems:   Acute renal failure superimposed on stage 3a chronic kidney disease (HCC)   Hyperglycemia due to type 2 diabetes mellitus (HCC)   Generalized weakness   Acute respiratory failure due to COVID-19 (HCC)   Obesity (BMI 30-39.9)  Pneumonia due to COVID-19 virus Acute respiratory failure due to COVID-19  -Patient with shortness of breath, increased work of breathing speaking in short sentences O2 sat 48% on arrival  And then on heated high flow at 50 L to keep sats in the low 90s, and chest x-ray showing multifocal opacities -1/6--1/10 Remdesivir for 5 days as per pharmacy  -Baricitinib for 14 days, methylprednisolone, albuterol, antitussives and vitamins -Oxygen delivery system to maintain sats over 92% Plan: Continue heated high flow, wean as tolerated Prone as tolerated Continue baricitinib Lasix 20 mg IV x1, reassess daily    Acute renal failure superimposed on stage 3a chronic kidney disease -creatinine 1.61 up from baseline of 1.13 Cr 1.38--1.33 Continue monitor renal functions   Hyperglycemia due to type 2 diabetes mellitus (HCC) -Basal and Premeal insulin -Sliding scale insulin  coverage  Generalized weaknessrelated to COVID -Nutritionist consult  Obesity (BMI 0000000) -Complicating factor to prognosis   DVT prophylaxis: Lovenox Code Status: Full Family Communication: None today Disposition Plan: Status is: Inpatient  Remains inpatient appropriate because:Inpatient level of care appropriate due to severity of illness   Dispo: The patient is from: Home              Anticipated d/c is to: SNF              Anticipated d/c date is: > 3 days              Patient currently is not medically stable to d/c.   Remains markedly hypoxic on heated high flow nasal cannula.  High risk for further deterioration and intubation.      Consultants:   PCCM  Procedures:   None  Antimicrobials:   Plan   Subjective: Seen and examined.  No pain complaints.  Reports objective improvement in shortness of breath.  Objective: Vitals:   06/25/20 1000 06/25/20 1100 06/25/20 1200 06/25/20 1300  BP: (!) 104/51 105/60 (!) 98/50 (!) 99/56  Pulse: 71 71 77 61  Resp: (!) 30 (!) 24 (!) 26 (!) 9  Temp: 98.3 F (36.8 C)     TempSrc: Oral     SpO2: 97% 94% 100% 98%  Weight:      Height:        Intake/Output Summary (Last 24 hours) at 06/25/2020 1446 Last data filed at 06/25/2020 1100 Gross per 24 hour  Intake 1200 ml  Output 1600 ml  Net -400 ml   Filed Weights   06/20/20 1944 06/23/20 0100  Weight: 104.4 kg  97.5 kg    Examination:  General exam: Appears calm and comfortable  Respiratory system: Coarse crackles bilaterally.  Normal work of breathing.  55 L Cardiovascular system: S1 & S2 heard, RRR. No JVD, murmurs, rubs, gallops or clicks. No pedal edema. Gastrointestinal system: Abdomen is nondistended, soft and nontender. No organomegaly or masses felt. Normal bowel sounds heard. Central nervous system: Alert and oriented. No focal neurological deficits. Extremities: Symmetric 5 x 5 power. Skin: No rashes, lesions or ulcers Psychiatry:  Judgement and insight appear normal. Mood & affect appropriate.     Data Reviewed: I have personally reviewed following labs and imaging studies  CBC: Recent Labs  Lab 06/21/20 0545 06/22/20 0600 06/23/20 0705 06/24/20 0259 06/25/20 0433  WBC 4.1 16.3* 17.3* 16.4* 17.7*  NEUTROABS 3.4 14.3* 15.4* 14.7* 14.4*  HGB 13.1 12.9* 13.3 14.7 14.6  HCT 39.7 39.2 40.5 42.8 43.5  MCV 89.8 89.3 89.2 88.4 88.4  PLT 276 318 214 177 627   Basic Metabolic Panel: Recent Labs  Lab 06/21/20 0545 06/22/20 0600 06/23/20 0705 06/24/20 0259 06/25/20 0433  NA 137 142 142 141 141  K 4.5 4.2 3.9 3.9 3.9  CL 102 106 106 97* 99  CO2 23 25 25 29 28   GLUCOSE 422* 176* 101* 110* 40*  BUN 35* 43* 42* 47* 47*  CREATININE 1.58* 1.38* 1.30* 1.33* 1.33*  CALCIUM 8.0* 8.8* 8.6* 8.5* 8.4*  MG 2.1 2.1 2.2 2.2  --   PHOS 2.8 2.7 3.3 3.5  --    GFR: Estimated Creatinine Clearance: 52.7 mL/min (A) (by C-G formula based on SCr of 1.33 mg/dL (H)). Liver Function Tests: Recent Labs  Lab 06/21/20 0545 06/22/20 0600 06/23/20 0705 06/24/20 0259 06/25/20 0433  AST 49* 66* 60* 47* 33  ALT 20 26 30 30 29   ALKPHOS 77 149* 220* 226* 184*  BILITOT 0.9 0.9 1.3* 1.6* 1.2  PROT 7.2 6.9 6.3* 6.7 6.2*  ALBUMIN 2.9* 2.8* 2.7* 2.9* 2.7*   No results for input(s): LIPASE, AMYLASE in the last 168 hours. No results for input(s): AMMONIA in the last 168 hours. Coagulation Profile: No results for input(s): INR, PROTIME in the last 168 hours. Cardiac Enzymes: No results for input(s): CKTOTAL, CKMB, CKMBINDEX, TROPONINI in the last 168 hours. BNP (last 3 results) No results for input(s): PROBNP in the last 8760 hours. HbA1C: No results for input(s): HGBA1C in the last 72 hours. CBG: Recent Labs  Lab 06/24/20 2125 06/24/20 2342 06/25/20 0636 06/25/20 0732 06/25/20 1131  GLUCAP 218* 173* 48* 86 91   Lipid Profile: No results for input(s): CHOL, HDL, LDLCALC, TRIG, CHOLHDL, LDLDIRECT in the last 72  hours. Thyroid Function Tests: No results for input(s): TSH, T4TOTAL, FREET4, T3FREE, THYROIDAB in the last 72 hours. Anemia Panel: No results for input(s): VITAMINB12, FOLATE, FERRITIN, TIBC, IRON, RETICCTPCT in the last 72 hours. Sepsis Labs: Recent Labs  Lab 06/20/20 2056  PROCALCITON <0.10    Recent Results (from the past 240 hour(s))  MRSA PCR Screening     Status: None   Collection Time: 06/23/20 12:53 AM   Specimen: Nasopharyngeal  Result Value Ref Range Status   MRSA by PCR NEGATIVE NEGATIVE Final    Comment:        The GeneXpert MRSA Assay (FDA approved for NASAL specimens only), is one component of a comprehensive MRSA colonization surveillance program. It is not intended to diagnose MRSA infection nor to guide or monitor treatment for MRSA infections. Performed at [facility], [phone]  [address], [place], [place] [address]          Radiology Studies: DG Chest [place] 1 View  Result Date: 06/24/2020 CLINICAL DATA:  Shortness of breath EXAM: PORTABLE CHEST 1 VIEW COMPARISON:  Yesterday FINDINGS: Cardiopericardial enlargement. Diffuse interstitial opacity and hazy airspace density. No visible effusion or pneumothorax. IMPRESSION: Stable low volume chest with diffuse infectious or congestive opacity. Electronically Signed   By: Monte Fantasia M.D.   On: 06/24/2020 05:48   ECHOCARDIOGRAM COMPLETE  Result Date: 06/23/2020    ECHOCARDIOGRAM REPORT   Patient Name:   Scott Lawson. Date of Exam: 06/23/2020 Medical Rec #:  [id number]             Height:       69.0 in Accession #:    [id number]            Weight:       214.9 lb Date of Birth:  12-Dec-1940             BSA:          2.130 m Patient Age:    50 years              BP:           139/68 mmHg Patient Gender: M                     HR:           58 bpm. Exam Location:  [facility] Procedure: 2D Echo Indications:     CHF-acute systolic B35.32  History:         Patient has no prior history of Echocardiogram  examinations.                  Risk Factors:Diabetes, Dyslipidemia and Hypertension.  Sonographer:     Avanell Shackleton Referring Phys:  [id number] Marshell Garfinkel Diagnosing Phys: Ida Rogue MD IMPRESSIONS  1. Left ventricular ejection fraction, by estimation, is 60 to 65%. The left ventricle has normal function. The left ventricle has no regional wall motion abnormalities. Left ventricular diastolic parameters are consistent with Grade II diastolic dysfunction (pseudonormalization).  2. Right ventricular systolic function is normal. The right ventricular size is normal.  3. Left atrial size was moderate to severely dilated.  4. The mitral valve is normal in structure. No evidence of mitral valve regurgitation. No evidence of mitral stenosis. FINDINGS  Left Ventricle: Left ventricular ejection fraction, by estimation, is 60 to 65%. The left ventricle has normal function. The left ventricle has no regional wall motion abnormalities. The left ventricular internal cavity size was normal in size. There is  no left ventricular hypertrophy. Left ventricular diastolic parameters are consistent with Grade II diastolic dysfunction (pseudonormalization). Right Ventricle: The right ventricular size is normal. No increase in right ventricular wall thickness. Right ventricular systolic function is normal. Left Atrium: Left atrial size was moderate to severely dilated. Right Atrium: Right atrial size was normal in size. Pericardium: There is no evidence of pericardial effusion. Mitral Valve: The mitral valve is normal in structure. No evidence of mitral valve regurgitation. No evidence of mitral valve stenosis. Tricuspid Valve: The tricuspid valve is normal in structure. Tricuspid valve regurgitation is mild . No evidence of tricuspid stenosis. Aortic Valve: The aortic valve is normal in structure. Aortic valve regurgitation is not visualized. Mild to moderate aortic valve sclerosis/calcification is present, without any evidence  of aortic stenosis. Pulmonic Valve: The pulmonic valve was normal  in structure. Pulmonic valve regurgitation is not visualized. No evidence of pulmonic stenosis. Aorta: The aortic root is normal in size and structure. Venous: The inferior vena cava is normal in size with greater than 50% respiratory variability, suggesting right atrial pressure of 3 mmHg. IAS/Shunts: No atrial level shunt detected by color flow Doppler.  LEFT VENTRICLE PLAX 2D LVIDd:         4.47 cm  Diastology LVIDs:         2.69 cm  LV e' medial:   4.35 cm/s LV PW:         1.24 cm  LV E/e' medial: 26.7 LV IVS:        1.18 cm LVOT diam:     2.00 cm LVOT Area:     3.14 cm  RIGHT VENTRICLE             IVC RV S prime:     16.20 cm/s  IVC diam: 1.40 cm TAPSE (M-mode): 1.9 cm LEFT ATRIUM              Index LA diam:        5.80 cm  2.72 cm/m LA Vol (A2C):   107.0 ml 50.23 ml/m LA Vol (A4C):   89.6 ml  42.06 ml/m LA Biplane Vol: 98.7 ml  46.33 ml/m   AORTA Ao Root diam: 3.70 cm MITRAL VALVE MV Area (PHT): 3.77 cm     SHUNTS MV Decel Time: 201 msec     Systemic Diam: 2.00 cm MV E velocity: 116.00 cm/s MV A velocity: 98.30 cm/s MV E/A ratio:  1.18 Ida Rogue MD Electronically signed by Ida Rogue MD Signature Date/Time: 06/23/2020/6:23:53 PM    Final         Scheduled Meds: . albuterol  2 puff Inhalation Q6H  . vitamin C  500 mg Oral Daily  . aspirin EC  81 mg Oral Daily  . atorvastatin  80 mg Oral Daily  . baricitinib  2 mg Oral Daily  . Chlorhexidine Gluconate Cloth  6 each Topical Daily  . dextrose      . enoxaparin (LOVENOX) injection  0.5 mg/kg Subcutaneous Q24H  . insulin aspart  0-20 Units Subcutaneous TID WC  . insulin aspart  0-5 Units Subcutaneous QHS  . insulin detemir  15 Units Subcutaneous BID  . linagliptin  5 mg Oral Daily  . lisinopril  5 mg Oral Daily  . predniSONE  50 mg Oral Daily  . tamsulosin  0.4 mg Oral Daily  . zinc sulfate  220 mg Oral Daily   Continuous Infusions:   LOS: 5 days    Time  spent: 15 minutes    Sidney Ace, MD [facility] Pager [phone]  If 7PM-7AM, please contact night-coverage 06/25/2020, 2:46 PM

## 2020-06-26 DIAGNOSIS — U071 COVID-19: Secondary | ICD-10-CM | POA: Diagnosis not present

## 2020-06-26 DIAGNOSIS — Z7189 Other specified counseling: Secondary | ICD-10-CM | POA: Diagnosis not present

## 2020-06-26 DIAGNOSIS — Z515 Encounter for palliative care: Secondary | ICD-10-CM | POA: Diagnosis not present

## 2020-06-26 DIAGNOSIS — J1282 Pneumonia due to coronavirus disease 2019: Secondary | ICD-10-CM | POA: Diagnosis not present

## 2020-06-26 LAB — RENAL FUNCTION PANEL
Albumin: 2.6 g/dL — ABNORMAL LOW (ref 3.5–5.0)
Anion gap: 10 (ref 5–15)
BUN: 41 mg/dL — ABNORMAL HIGH (ref 8–23)
CO2: 31 mmol/L (ref 22–32)
Calcium: 7.9 mg/dL — ABNORMAL LOW (ref 8.9–10.3)
Chloride: 97 mmol/L — ABNORMAL LOW (ref 98–111)
Creatinine, Ser: 1.32 mg/dL — ABNORMAL HIGH (ref 0.61–1.24)
GFR, Estimated: 55 mL/min — ABNORMAL LOW (ref >60)
Glucose, Bld: 53 mg/dL — ABNORMAL LOW (ref 70–99)
Phosphorus: 3.8 mg/dL (ref 2.5–4.6)
Potassium: 3.7 mmol/L (ref 3.5–5.1)
Sodium: 138 mmol/L (ref 135–145)

## 2020-06-26 LAB — CBC WITH DIFFERENTIAL/PLATELET
Abs Immature Granulocytes: 0.17 10*3/uL — ABNORMAL HIGH (ref 0.00–0.07)
Basophils Absolute: 0 10*3/uL (ref 0.0–0.1)
Basophils Relative: 0 %
Eosinophils Absolute: 0 10*3/uL (ref 0.0–0.5)
Eosinophils Relative: 0 %
HCT: 42.5 % (ref 39.0–52.0)
Hemoglobin: 13.9 g/dL (ref 13.0–17.0)
Immature Granulocytes: 1 %
Lymphocytes Relative: 7 %
Lymphs Abs: 0.8 10*3/uL (ref 0.7–4.0)
MCH: 29 pg (ref 26.0–34.0)
MCHC: 32.7 g/dL (ref 30.0–36.0)
MCV: 88.7 fL (ref 80.0–100.0)
Monocytes Absolute: 0.5 10*3/uL (ref 0.1–1.0)
Monocytes Relative: 4 %
Neutro Abs: 10.6 10*3/uL — ABNORMAL HIGH (ref 1.7–7.7)
Neutrophils Relative %: 88 %
Platelets: 178 10*3/uL (ref 150–400)
RBC: 4.79 MIL/uL (ref 4.22–5.81)
RDW: 13.1 % (ref 11.5–15.5)
WBC: 12.1 10*3/uL — ABNORMAL HIGH (ref 4.0–10.5)
nRBC: 0 % (ref 0.0–0.2)

## 2020-06-26 LAB — GLUCOSE, CAPILLARY
Glucose-Capillary: 123 mg/dL — ABNORMAL HIGH (ref 70–99)
Glucose-Capillary: 74 mg/dL (ref 70–99)
Glucose-Capillary: 235 mg/dL — ABNORMAL HIGH (ref 70–99)
Glucose-Capillary: 416 mg/dL — ABNORMAL HIGH (ref 70–99)
Glucose-Capillary: 453 mg/dL — ABNORMAL HIGH (ref 70–99)
Glucose-Capillary: 411 mg/dL — ABNORMAL HIGH (ref 70–99)
Glucose-Capillary: 380 mg/dL — ABNORMAL HIGH (ref 70–99)

## 2020-06-26 LAB — MAGNESIUM: Magnesium: 2.1 mg/dL (ref 1.7–2.4)

## 2020-06-26 MED ORDER — DEXTROSE 50 % IV SOLN
1.0000 | Freq: Once | INTRAVENOUS | Status: AC
  Administered 2020-06-26: 50 mL via INTRAVENOUS
  Filled 2020-06-26: qty 50

## 2020-06-26 MED ORDER — INSULIN DETEMIR 100 UNIT/ML ~~LOC~~ SOLN: 10.0000 [IU] | Freq: Once | SUBCUTANEOUS | Status: DC

## 2020-06-26 MED ORDER — FUROSEMIDE 10 MG/ML IJ SOLN
40.0000 mg | Freq: Once | INTRAMUSCULAR | Status: DC
  Filled 2020-06-26: qty 4

## 2020-06-26 MED ORDER — INSULIN DETEMIR 100 UNIT/ML ~~LOC~~ SOLN
10.0000 [IU] | Freq: Every day | SUBCUTANEOUS | Status: DC
  Administered 2020-06-27: 10 [IU] via SUBCUTANEOUS
  Filled 2020-06-26 (×2): qty 0.1

## 2020-06-26 MED ORDER — INSULIN ASPART 100 UNIT/ML ~~LOC~~ SOLN
0.0000 [IU] | Freq: Three times a day (TID) | SUBCUTANEOUS | Status: DC
  Administered 2020-06-27: 20 [IU] via SUBCUTANEOUS
  Administered 2020-06-27 (×2): 7 [IU] via SUBCUTANEOUS
  Administered 2020-06-27: 4 [IU] via SUBCUTANEOUS
  Administered 2020-06-28: 2 [IU] via SUBCUTANEOUS
  Administered 2020-06-28: 20 [IU] via SUBCUTANEOUS
  Filled 2020-06-26 (×7): qty 1

## 2020-06-26 MED ORDER — INSULIN ASPART 100 UNIT/ML IV SOLN: 10.0000 [IU] | Freq: Once | INTRAVENOUS | Status: DC

## 2020-06-26 MED ORDER — INSULIN ASPART 100 UNIT/ML IV SOLN
10.0000 [IU] | Freq: Once | INTRAVENOUS | Status: AC
  Administered 2020-06-26: 10 [IU] via INTRAVENOUS
  Filled 2020-06-26: qty 0.1

## 2020-06-26 MED ORDER — INSULIN ASPART 100 UNIT/ML ~~LOC~~ SOLN
10.0000 [IU] | Freq: Once | SUBCUTANEOUS | Status: AC
  Administered 2020-06-26: 10 [IU] via SUBCUTANEOUS
  Filled 2020-06-26: qty 1

## 2020-06-26 NOTE — Progress Notes (Signed)
PROGRESS NOTE    Scott Lawson.  YW:[id number] DOB: March 11, 1941 DOA: 06/20/2020 PCP: Venita Lick, NP    Brief Narrative:  80 y.o.malewith medical history significant fordiabetes, BPH, HTN, HLD, obesity and GERD, who presents to the emergency room with a5-dayhistory of generalized malaise associated with lightheadedness and thenover the past 24 hours,started feeling short of breath. He denies chest pain, fever or chills, vomiting or diarrhea or abdominal pain. He is unvaccinated against COVIDand states he is not interested in getting the vaccine  Patient dependent on heated high flow between 50 and 55 L.  Unable to wean past this point.  Hypoglycemia noted in the mornings.  Adjustments made in diabetic regimen..   Assessment & Plan:   Principal Problem:   Pneumonia due to COVID-19 virus Active Problems:   Acute renal failure superimposed on stage 3a chronic kidney disease (HCC)   Hyperglycemia due to type 2 diabetes mellitus (HCC)   Generalized weakness   Acute respiratory failure due to COVID-19 (HCC)   Obesity (BMI 30-39.9)  Pneumonia due to COVID-19 virus Acute respiratory failure due to COVID-19  -Patient with shortness of breath, increased work of breathing speaking in short sentences O2 sat 48% on arrival  And then on heated high flow at 50 L to keep sats in the low 90s, and chest x-ray showing multifocal opacities -1/6--1/10 Remdesivir for 5 days as per pharmacy  -Baricitinib for 14 days, methylprednisolone, albuterol, antitussives and vitamins -Oxygen delivery system to maintain sats over 88% Plan: Continue heated high flow, wean as tolerated Prone as tolerated Continue baricitinib Lasix 40 mg IV x1, reassess daily for diuretic need intolerance  Acute renal failure superimposed on stage 3a chronic kidney disease -creatinine 1.61 up from baseline of 1.13 Cr 1.38--1.33 Continue monitor renal functions  Hyperglycemia due to type 2 diabetes  mellitus (HCC) A.m. hypoglycemia -Decrease basal insulin to 10 units nightly -Sliding scale insulin coverage  Generalized weaknessrelated to COVID -Nutritionist consult  Obesity (BMI 0000000) -Complicating factor to prognosis   DVT prophylaxis: Lovenox Code Status: Full Family Communication: None today Disposition Plan: Status is: Inpatient  Remains inpatient appropriate because:Inpatient level of care appropriate due to severity of illness   Dispo: The patient is from: Home              Anticipated d/c is to: SNF              Anticipated d/c date is: > 3 days              Patient currently is not medically stable to d/c.   Remains markedly hypoxic on heated high flow nasal cannula.  High risk for further deterioration and intubation.      Consultants:   PCCM  Procedures:   None  Antimicrobials:   Plan   Subjective: Patient seen and examined.  No pain complaints.  Shortness of breath improving per his report.  Objective: Vitals:   06/26/20 0700 06/26/20 0800 06/26/20 0900 06/26/20 1000  BP: 138/70 (!) 95/54 112/61 (!) 96/52  Pulse: (!) 59 65 86 95  Resp: (!) 21 (!) 23 (!) 24 (!) 23  Temp:  (!) 97 F (36.1 C)    TempSrc:  Oral    SpO2: 98% 97% (!) 88% 98%  Weight:      Height:        Intake/Output Summary (Last 24 hours) at 06/26/2020 1300 Last data filed at 06/26/2020 1000 Gross per 24 hour  Intake 450 ml  Output  1500 ml  Net -1050 ml   Filed Weights   06/20/20 1944 06/23/20 0100  Weight: 104.4 kg 97.5 kg    Examination:  General exam: Appears calm and comfortable  Respiratory system: Coarse crackles bilaterally.  Normal work of breathing.  55 L Cardiovascular system: S1 & S2 heard, RRR. No JVD, murmurs, rubs, gallops or clicks. No pedal edema. Gastrointestinal system: Abdomen is nondistended, soft and nontender. No organomegaly or masses felt. Normal bowel sounds heard. Central nervous system: Alert and oriented. No focal  neurological deficits. Extremities: Symmetric 5 x 5 power. Skin: No rashes, lesions or ulcers Psychiatry: Judgement and insight appear normal. Mood & affect appropriate.     Data Reviewed: I have personally reviewed following labs and imaging studies  CBC: Recent Labs  Lab 06/22/20 0600 06/23/20 0705 06/24/20 0259 06/25/20 0433 06/26/20 0237  WBC 16.3* 17.3* 16.4* 17.7* 12.1*  NEUTROABS 14.3* 15.4* 14.7* 14.4* 10.6*  HGB 12.9* 13.3 14.7 14.6 13.9  HCT 39.2 40.5 42.8 43.5 42.5  MCV 89.3 89.2 88.4 88.4 88.7  PLT 318 214 177 200 299   Basic Metabolic Panel: Recent Labs  Lab 06/21/20 0545 06/22/20 0600 06/23/20 0705 06/24/20 0259 06/25/20 0433 06/26/20 0237  NA 137 142 142 141 141 138  K 4.5 4.2 3.9 3.9 3.9 3.7  CL 102 106 106 97* 99 97*  CO2 23 25 25 29 28 31   GLUCOSE 422* 176* 101* 110* 40* 53*  BUN 35* 43* 42* 47* 47* 41*  CREATININE 1.58* 1.38* 1.30* 1.33* 1.33* 1.32*  CALCIUM 8.0* 8.8* 8.6* 8.5* 8.4* 7.9*  MG 2.1 2.1 2.2 2.2  --  2.1  PHOS 2.8 2.7 3.3 3.5  --  3.8   GFR: Estimated Creatinine Clearance: 53.1 mL/min (A) (by C-G formula based on SCr of 1.32 mg/dL (H)). Liver Function Tests: Recent Labs  Lab 06/21/20 0545 06/22/20 0600 06/23/20 0705 06/24/20 0259 06/25/20 0433 06/26/20 0237  AST 49* 66* 60* 47* 33  --   ALT 20 26 30 30 29   --   ALKPHOS 77 149* 220* 226* 184*  --   BILITOT 0.9 0.9 1.3* 1.6* 1.2  --   PROT 7.2 6.9 6.3* 6.7 6.2*  --   ALBUMIN 2.9* 2.8* 2.7* 2.9* 2.7* 2.6*   No results for input(s): LIPASE, AMYLASE in the last 168 hours. No results for input(s): AMMONIA in the last 168 hours. Coagulation Profile: No results for input(s): INR, PROTIME in the last 168 hours. Cardiac Enzymes: No results for input(s): CKTOTAL, CKMB, CKMBINDEX, TROPONINI in the last 168 hours. BNP (last 3 results) No results for input(s): PROBNP in the last 8760 hours. HbA1C: No results for input(s): HGBA1C in the last 72 hours. CBG: Recent Labs  Lab  06/25/20 1131 06/25/20 1545 06/25/20 2149 06/26/20 0520 06/26/20 0742  GLUCAP 91 147* 90 123* 74   Lipid Profile: No results for input(s): CHOL, HDL, LDLCALC, TRIG, CHOLHDL, LDLDIRECT in the last 72 hours. Thyroid Function Tests: No results for input(s): TSH, T4TOTAL, FREET4, T3FREE, THYROIDAB in the last 72 hours. Anemia Panel: No results for input(s): VITAMINB12, FOLATE, FERRITIN, TIBC, IRON, RETICCTPCT in the last 72 hours. Sepsis Labs: Recent Labs  Lab 06/20/20 2056  PROCALCITON <0.10    Recent Results (from the past 240 hour(s))  MRSA PCR Screening     Status: None   Collection Time: 06/23/20 12:53 AM   Specimen: Nasopharyngeal  Result Value Ref Range Status   MRSA by PCR NEGATIVE NEGATIVE Final    Comment:  The GeneXpert MRSA Assay (FDA approved for NASAL specimens only), is one component of a comprehensive MRSA colonization surveillance program. It is not intended to diagnose MRSA infection nor to guide or monitor treatment for MRSA infections. Performed at [facility], [address], [place], [place] [address]          Radiology Studies: No results found.      Scheduled Meds: . albuterol  2 puff Inhalation Q6H  . vitamin C  500 mg Oral Daily  . aspirin EC  81 mg Oral Daily  . atorvastatin  80 mg Oral Daily  . baricitinib  2 mg Oral Daily  . Chlorhexidine Gluconate Cloth  6 each Topical Daily  . enoxaparin (LOVENOX) injection  0.5 mg/kg Subcutaneous Q24H  . insulin aspart  0-20 Units Subcutaneous TID WC  . insulin aspart  0-5 Units Subcutaneous QHS  . [START ON 06/27/2020] insulin detemir  10 Units Subcutaneous QHS  . linagliptin  5 mg Oral Daily  . lisinopril  5 mg Oral Daily  . predniSONE  50 mg Oral Daily  . tamsulosin  0.4 mg Oral Daily  . zinc sulfate  220 mg Oral Daily   Continuous Infusions:   LOS: 6 days    Time spent: 15 minutes    Sidney Ace, MD [facility] Pager [phone]  If  7PM-7AM, please contact night-coverage 06/26/2020, 1:00 PM

## 2020-06-26 NOTE — Progress Notes (Signed)
Patient hypoglycemic this AM, 53 on BMP. Levemir was held at the beginning of the shift. Patient taking in very little PO.   -amp of D50 given -blood sugar checks Q3 for now until stable   [employer] ACNP-BC

## 2020-06-26 NOTE — Consult Note (Signed)
Consultation Note Date: 06/26/2020   Patient Name: Scott Lawson.  DOB: 02-15-1941  MRN: [id number]  Age / Sex: 80 y.o., male  PCP: Venita Lick, NP Referring Physician: Sidney Ace, MD  Reason for Consultation: Establishing goals of care  HPI/Patient Profile: 80 y.o. male  with past medical history of diabetes, CKD stage 3a, HTN, HLD, GERD, BPH, obesity admitted on 06/20/2020 with weakness and shortness of breath due to COVID multifocal pneumonia requiring heated HFNC 50L and 70% FiO2.   Clinical Assessment and Goals of Care: I met today at Scott Lawson's bedside. He is alert and oriented. He tells me that he is feeling a little better. He is frustrated that he has not improved further. I explained to him that his lungs are still very sick. We discussed expectations with COVID pneumonia and that this is a long recovery and sometimes people recover well and sometimes people get worse instead of better. Some people have permanent lung damage from the infection. He understands but is very frustrated. We reviewed medications and unfortunately the limited medical interventions we have available to treat COVID and that it is really up to his body to utilize the medications and oxygen we provide to recover.   I explained to Scott Lawson that we are trying to help him to improve but there is the chance he could get worse. I asked him if he were to get worse what he would be willing to go through. He tells me that he continues to desire full aggressive care including ventilator support if needed. When discussing what this could mean as far as poor prognosis he became frustrated and political about COVID. I did ask him if he was unable to make these decisions for himself who would he trust to make medical decisions for him and he tells me that would be his daughter, [name]. He gives me permission to call her for  update. I will reach out to [facility] tomorrow.   All questions/concerns addressed. Emotional support provided.   Primary Decision Maker PATIENT    SUMMARY OF RECOMMENDATIONS   - Full code - Full aggressive care  Code Status/Advance Care Planning:  Full code   Symptom Management:   Per attending, PCCM.   Palliative Prophylaxis:   Aspiration, Bowel Regimen, Delirium Protocol, Frequent Pain Assessment, Oral Care and Turn Reposition  Additional Recommendations (Limitations, Scope, Preferences):  Full Scope Treatment  Psycho-social/Spiritual:   Desire for further Chaplaincy support:no  Prognosis:   Prognosis guarded.   Discharge Planning: To Be Determined      Primary Diagnoses: Present on Admission: **None**   I have reviewed the medical record, interviewed the patient and family, and examined the patient. The following aspects are pertinent.  Past Medical History:  Diagnosis Date  . BPH (benign prostatic hyperplasia)   . Diabetes mellitus without complication ([place])   . Elevated PSA   . GERD (gastroesophageal reflux disease)   . Hypertension   . Hypertriglyceridemia   . Nocturia    Social History  Socioeconomic History  . Marital status: Married    Spouse name: Not on file  . Number of children: Not on file  . Years of education: Not on file  . Highest education level: High school graduate  Occupational History  . Occupation: retired  Tobacco Use  . Smoking status: Never Smoker  . Smokeless tobacco: Never Used  Vaping Use  . Vaping Use: Never used  Substance and Sexual Activity  . Alcohol use: No    Alcohol/week: 0.0 standard drinks  . Drug use: No  . Sexual activity: Yes  Other Topics Concern  . Not on file  Social History Narrative  . Not on file   Social Determinants of Health   Financial Resource Strain: Medium Risk  . Difficulty of Paying Living Expenses: Somewhat hard  Food Insecurity: No Food Insecurity  . Worried About Paediatric nurse in the Last Year: Never true  . Ran Out of Food in the Last Year: Never true  Transportation Needs: No Transportation Needs  . Lack of Transportation (Medical): No  . Lack of Transportation (Non-Medical): No  Physical Activity: Inactive  . Days of Exercise per Week: 0 days  . Minutes of Exercise per Session: 0 min  Stress: Not on file  Social Connections: Socially Isolated  . Frequency of Communication with Friends and Family: More than three times a week  . Frequency of Social Gatherings with Friends and Family: More than three times a week  . Attends Religious Services: Never  . Active Member of Clubs or Organizations: No  . Attends Archivist Meetings: Never  . Marital Status: Widowed   Family History  Problem Relation Age of Onset  . Cancer Mother        lung  . Heart disease Father   . Diabetes Brother   . Prostate cancer Neg Hx   . Bladder Cancer Neg Hx   . Kidney cancer Neg Hx    Scheduled Meds: . albuterol  2 puff Inhalation Q6H  . vitamin C  500 mg Oral Daily  . aspirin EC  81 mg Oral Daily  . atorvastatin  80 mg Oral Daily  . baricitinib  2 mg Oral Daily  . Chlorhexidine Gluconate Cloth  6 each Topical Daily  . enoxaparin (LOVENOX) injection  0.5 mg/kg Subcutaneous Q24H  . insulin aspart  0-20 Units Subcutaneous TID WC  . insulin aspart  0-5 Units Subcutaneous QHS  . insulin detemir  15 Units Subcutaneous BID  . linagliptin  5 mg Oral Daily  . lisinopril  5 mg Oral Daily  . predniSONE  50 mg Oral Daily  . tamsulosin  0.4 mg Oral Daily  . zinc sulfate  220 mg Oral Daily   Continuous Infusions: PRN Meds:.acetaminophen, ALPRAZolam, chlorpheniramine-HYDROcodone, guaiFENesin-dextromethorphan, loperamide, ondansetron **OR** ondansetron (ZOFRAN) IV, sodium chloride Allergies  Allergen Reactions  . Gabapentin Other (See Comments)    "made me fall out"   Review of Systems  Constitutional: Positive for activity change and fatigue.   Respiratory: Positive for shortness of breath.   Neurological: Positive for weakness.    Physical Exam Vitals and nursing note reviewed.  Constitutional:      General: He is not in acute distress.    Appearance: He is ill-appearing.  Cardiovascular:     Rate and Rhythm: Normal rate.  Pulmonary:     Effort: No tachypnea, accessory muscle usage or respiratory distress.     Comments: Heated HFNC Abdominal:     General:  Abdomen is flat.     Palpations: Abdomen is soft.  Neurological:     Mental Status: He is alert and oriented to person, place, and time.     Vital Signs: BP (!) 95/54 (BP Location: Left Arm)   Pulse 65   Temp (!) 97 F (36.1 C)   Resp (!) 23   Ht 5' 9" (1.753 m)   Wt 97.5 kg   SpO2 97%   BMI 31.74 kg/m  Pain Scale: 0-10   Pain Score: 0-No pain   SpO2: SpO2: 97 % O2 Device:SpO2: 97 % O2 Flow Rate: .O2 Flow Rate (L/min): 50 L/min  IO: Intake/output summary:   Intake/Output Summary (Last 24 hours) at 06/26/2020 8588 Last data filed at 06/25/2020 2200 Gross per 24 hour  Intake 800 ml  Output 1100 ml  Net -300 ml    LBM: Last BM Date: 06/25/20 Baseline Weight: Weight: 104.4 kg Most recent weight: Weight: 97.5 kg     Palliative Assessment/Data:     Time In: 1000 Time Out: 1050 Time Total: 50 Greater than 50%  of this time was spent counseling and coordinating care related to the above assessment and plan.  Signed by: Vinie Sill, NP Palliative Medicine Team Pager # [phone] (M-F 8a-5p) Team Phone # [phone] (Nights/Weekends)

## 2020-06-26 NOTE — Plan of Care (Signed)
Pt on HFNC at 55L and 70%. Has not needed NRB today, doing ok with HFNC alone. Blood glucose has been volatile; was low yesterday and overnight so Levemir held this am. Then on 1600 check, high in the 400s. Discussed with Dr.Sreenath, given 10units novolog IV x 1. Rechecked an hour later, still in 400s. Given correctional sliding scale just now to further treat. Plan to restart levemir tonight. Lasix was given yesterday for pulmonary congestion, held today for low/marginal BP. Plan to hold antihypertensives tomorrow to facilitate diuresis tomorrow. Pt is in good spirits, making jokes, eating fair amount of meals, making occasional calls on his cell phone.  Problem: Education: Goal: Knowledge of General Education information will improve Description: Including pain rating scale, medication(s)/side effects and non-pharmacologic comfort measures Outcome: Progressing   Problem: Clinical Measurements: Goal: Cardiovascular complication will be avoided Outcome: Progressing   Problem: Nutrition: Goal: Adequate nutrition will be maintained Outcome: Progressing   Problem: Coping: Goal: Level of anxiety will decrease Outcome: Progressing   Problem: Elimination: Goal: Will not experience complications related to bowel motility Outcome: Progressing Goal: Will not experience complications related to urinary retention Outcome: Progressing   Problem: Pain Managment: Goal: General experience of comfort will improve Outcome: Progressing   Problem: Safety: Goal: Ability to remain free from injury will improve Outcome: Progressing   Problem: Skin Integrity: Goal: Risk for impaired skin integrity will decrease Outcome: Progressing   Problem: Health Behavior/Discharge Planning: Goal: Ability to manage health-related needs will improve Outcome: Not Progressing   Problem: Clinical Measurements: Goal: Ability to maintain clinical measurements within normal limits will improve Outcome: Not  Progressing Goal: Will remain free from infection Outcome: Not Progressing Goal: Diagnostic test results will improve Outcome: Not Progressing Goal: Respiratory complications will improve Outcome: Not Progressing   Problem: Activity: Goal: Risk for activity intolerance will decrease Outcome: Not Progressing

## 2020-06-26 NOTE — Progress Notes (Signed)
Inpatient Diabetes Program Recommendations  AACE/ADA: New Consensus Statement on Inpatient Glycemic Control   Target Ranges:  Prepandial:   less than 140 mg/dL      Peak postprandial:   less than 180 mg/dL (1-2 hours)      Critically ill patients:  140 - 180 mg/dL   Results for Scott Lawson, Scott Lawson (MRN [id number]) as of 06/26/2020 10:46  Ref. Range 06/25/2020 06:36 06/25/2020 07:32 06/25/2020 11:31 06/25/2020 15:45 06/25/2020 21:49 06/26/2020 05:20 06/26/2020 07:42  Glucose-Capillary Latest Ref Range: 70 - 99 mg/dL 48 (L) 86 91  Levemir 15 units 147 (H) 90 123 (H) 74   Review of Glycemic Control  Current orders for Inpatient glycemic control: Levemir 15 units BID, Novolog 0-20 units TID with meals, Novolog 0-5 units QHS, Tradjenta 5 mg daily; Prednisone 50 mg daily  Inpatient Diabetes Program Recommendations:    Insulin: Patient only received Levemir 15 units once on 06/25/20 and fasting CBG 73 mg/dl this morning. Please consider decreasing Levemir to 10 units QHS.  Thanks, Barnie Alderman, RN, MSN, CDE Diabetes Coordinator Inpatient Diabetes Program [phone] (Team Pager from 8am to 5pm)

## 2020-06-27 DIAGNOSIS — U071 COVID-19: Secondary | ICD-10-CM | POA: Diagnosis not present

## 2020-06-27 DIAGNOSIS — J1282 Pneumonia due to coronavirus disease 2019: Secondary | ICD-10-CM | POA: Diagnosis not present

## 2020-06-27 LAB — CREATININE, SERUM
Creatinine, Ser: 1.45 mg/dL — ABNORMAL HIGH (ref 0.61–1.24)
GFR, Estimated: 49 mL/min — ABNORMAL LOW (ref >60)

## 2020-06-27 LAB — RENAL FUNCTION PANEL
Albumin: 2.4 g/dL — ABNORMAL LOW (ref 3.5–5.0)
Anion gap: 10 (ref 5–15)
BUN: 46 mg/dL — ABNORMAL HIGH (ref 8–23)
CO2: 31 mmol/L (ref 22–32)
Calcium: 8 mg/dL — ABNORMAL LOW (ref 8.9–10.3)
Chloride: 99 mmol/L (ref 98–111)
Creatinine, Ser: 1.51 mg/dL — ABNORMAL HIGH (ref 0.61–1.24)
GFR, Estimated: 47 mL/min — ABNORMAL LOW (ref >60)
Glucose, Bld: 68 mg/dL — ABNORMAL LOW (ref 70–99)
Phosphorus: 4 mg/dL (ref 2.5–4.6)
Potassium: 4.3 mmol/L (ref 3.5–5.1)
Sodium: 140 mmol/L (ref 135–145)

## 2020-06-27 LAB — CBC WITH DIFFERENTIAL/PLATELET
Abs Immature Granulocytes: 0.18 10*3/uL — ABNORMAL HIGH (ref 0.00–0.07)
Basophils Absolute: 0 10*3/uL (ref 0.0–0.1)
Basophils Relative: 0 %
Eosinophils Absolute: 0 10*3/uL (ref 0.0–0.5)
Eosinophils Relative: 0 %
HCT: 42.3 % (ref 39.0–52.0)
Hemoglobin: 13.8 g/dL (ref 13.0–17.0)
Immature Granulocytes: 2 %
Lymphocytes Relative: 6 %
Lymphs Abs: 0.6 10*3/uL — ABNORMAL LOW (ref 0.7–4.0)
MCH: 28.9 pg (ref 26.0–34.0)
MCHC: 32.6 g/dL (ref 30.0–36.0)
MCV: 88.5 fL (ref 80.0–100.0)
Monocytes Absolute: 0.4 10*3/uL (ref 0.1–1.0)
Monocytes Relative: 4 %
Neutro Abs: 9.3 10*3/uL — ABNORMAL HIGH (ref 1.7–7.7)
Neutrophils Relative %: 88 %
Platelets: 206 10*3/uL (ref 150–400)
RBC: 4.78 MIL/uL (ref 4.22–5.81)
RDW: 13.2 % (ref 11.5–15.5)
WBC: 10.5 10*3/uL (ref 4.0–10.5)
nRBC: 0 % (ref 0.0–0.2)

## 2020-06-27 LAB — GLUCOSE, CAPILLARY
Glucose-Capillary: 122 mg/dL — ABNORMAL HIGH (ref 70–99)
Glucose-Capillary: 125 mg/dL — ABNORMAL HIGH (ref 70–99)
Glucose-Capillary: 151 mg/dL — ABNORMAL HIGH (ref 70–99)
Glucose-Capillary: 213 mg/dL — ABNORMAL HIGH (ref 70–99)
Glucose-Capillary: 221 mg/dL — ABNORMAL HIGH (ref 70–99)
Glucose-Capillary: 405 mg/dL — ABNORMAL HIGH (ref 70–99)
Glucose-Capillary: 423 mg/dL — ABNORMAL HIGH (ref 70–99)
Glucose-Capillary: 68 mg/dL — ABNORMAL LOW (ref 70–99)
Glucose-Capillary: 81 mg/dL (ref 70–99)

## 2020-06-27 LAB — MAGNESIUM: Magnesium: 2.4 mg/dL (ref 1.7–2.4)

## 2020-06-27 LAB — GLUCOSE, RANDOM: Glucose, Bld: 347 mg/dL — ABNORMAL HIGH (ref 70–99)

## 2020-06-27 MED ORDER — INSULIN ASPART 100 UNIT/ML ~~LOC~~ SOLN
8.0000 [IU] | Freq: Once | SUBCUTANEOUS | Status: AC
  Administered 2020-06-27: 8 [IU] via SUBCUTANEOUS

## 2020-06-27 MED ORDER — FUROSEMIDE 10 MG/ML IJ SOLN: 40.0000 mg | Freq: Once | INTRAMUSCULAR | Status: DC

## 2020-06-27 NOTE — Progress Notes (Addendum)
Palliative:  Scott Lawson sleeping x 2. I attempted to call daughter, Scott Lawson x 2 but no answer and no voicemail. Scott Lawson is who [name] identified yesterday as his main contact and surrogate decision maker. Goals are clear for ongoing aggressive care and full code status.   Please call [phone] for acute palliative needs or follow up. Otherwise will follow peripherally and shadow for changes or needs.   No charge  Vinie Sill, NP Palliative Medicine Team Pager [phone] (Please see [email or web address] for schedule) Team Phone [phone]

## 2020-06-27 NOTE — Progress Notes (Signed)
PROGRESS NOTE    Scott Lawson.  PYP:[id number] DOB: 1940/12/17 DOA: 06/20/2020 PCP: Venita Lick, NP    Brief Narrative:  80 y.o.malewith medical history significant fordiabetes, BPH, HTN, HLD, obesity and GERD, who presents to the emergency room with a5-dayhistory of generalized malaise associated with lightheadedness and thenover the past 24 hours,started feeling short of breath. He denies chest pain, fever or chills, vomiting or diarrhea or abdominal pain. He is unvaccinated against COVIDand states he is not interested in getting the vaccine  Patient dependent on heated high flow between 50 and 55 L.  Unable to wean past this point.  Hypoglycemia noted in the mornings.  Adjustments made in diabetic regimen.  Hypoglycemia improved.  Making slow progress in terms of oxygenation.   Assessment & Plan:   Principal Problem:   Pneumonia due to COVID-19 virus Active Problems:   Acute renal failure superimposed on stage 3a chronic kidney disease (HCC)   Hyperglycemia due to type 2 diabetes mellitus (HCC)   Generalized weakness   Acute respiratory failure due to COVID-19 (HCC)   Obesity (BMI 30-39.9)  Pneumonia due to COVID-19 virus Acute respiratory failure due to COVID-19  -Patient with shortness of breath, increased work of breathing speaking in short sentences O2 sat 48% on arrival  And then on heated high flow at 50 L to keep sats in the low 90s, and chest x-ray showing multifocal opacities -1/6--1/10 Remdesivir for 5 days as per pharmacy  -Baricitinib for 14 days, methylprednisolone, albuterol, antitussives and vitamins -Oxygen delivery system to maintain sats over 88% Plan: Continue heated high flow, wean as tolerated Prone as tolerated Continue baricitinib Hold diuretic today.  Reassess daily for diuretic need and tolerance  Acute renal failure superimposed on stage 3a chronic kidney disease -creatinine 1.61 up from baseline of 1.13 Cr  1.38--1.33--1.5 Continue monitor renal functions  Hyperglycemia due to type 2 diabetes mellitus (HCC) A.m. hypoglycemia -Decrease basal insulin to 10 units nightly -Sliding scale insulin coverage  Generalized weaknessrelated to COVID -Nutritionist consult  Obesity (BMI 24-58.0) -Complicating factor to prognosis   DVT prophylaxis: Lovenox Code Status: Full Family Communication: None today Disposition Plan: Status is: Inpatient  Remains inpatient appropriate because:Inpatient level of care appropriate due to severity of illness   Dispo: The patient is from: Home              Anticipated d/c is to: SNF              Anticipated d/c date is: > 3 days              Patient currently is not medically stable to d/c.   Remains markedly hypoxic.  Seems to be making slow improvement.      Consultants:   PCCM  Procedures:   None  Antimicrobials:   Plan   Subjective: Patient seen and examined.  Normal WOB.  No pain complaints.  Objective: Vitals:   06/27/20 0800 06/27/20 0900 06/27/20 1100 06/27/20 1200  BP: (!) 92/50 (!) 74/38 108/63 (!) 92/53  Pulse: 76 76 81 76  Resp: 19 14 17 17   Temp:  97.7 F (36.5 C)  98.1 F (36.7 C)  TempSrc:  Oral  Axillary  SpO2: 96% 94% (!) 87% 90%  Weight:      Height:        Intake/Output Summary (Last 24 hours) at 06/27/2020 1412 Last data filed at 06/27/2020 0900 Gross per 24 hour  Intake -  Output 600 ml  Net -  600 ml   Filed Weights   06/20/20 1944 06/23/20 0100  Weight: 104.4 kg 97.5 kg    Examination:  General exam: Appears calm and comfortable  Respiratory system: Coarse crackles bilaterally.  Normal work of breathing.  55 L Cardiovascular system: S1 & S2 heard, RRR. No JVD, murmurs, rubs, gallops or clicks. No pedal edema. Gastrointestinal system: Abdomen is nondistended, soft and nontender. No organomegaly or masses felt. Normal bowel sounds heard. Central nervous system: Alert and oriented. No  focal neurological deficits. Extremities: Symmetric 5 x 5 power. Skin: No rashes, lesions or ulcers Psychiatry: Judgement and insight appear normal. Mood & affect appropriate.     Data Reviewed: I have personally reviewed following labs and imaging studies  CBC: Recent Labs  Lab 06/23/20 0705 06/24/20 0259 06/25/20 0433 06/26/20 0237 06/27/20 0310  WBC 17.3* 16.4* 17.7* 12.1* 10.5  NEUTROABS 15.4* 14.7* 14.4* 10.6* 9.3*  HGB 13.3 14.7 14.6 13.9 13.8  HCT 40.5 42.8 43.5 42.5 42.3  MCV 89.2 88.4 88.4 88.7 88.5  PLT 214 177 200 178 696   Basic Metabolic Panel: Recent Labs  Lab 06/22/20 0600 06/23/20 0705 06/24/20 0259 06/25/20 0433 06/26/20 0237 06/27/20 0310  NA 142 142 141 141 138 140  K 4.2 3.9 3.9 3.9 3.7 4.3  CL 106 106 97* 99 97* 99  CO2 25 25 29 28 31 31   GLUCOSE 176* 101* 110* 40* 53* 68*  BUN 43* 42* 47* 47* 41* 46*  CREATININE 1.38* 1.30* 1.33* 1.33* 1.32* 1.51*  1.45*  CALCIUM 8.8* 8.6* 8.5* 8.4* 7.9* 8.0*  MG 2.1 2.2 2.2  --  2.1 2.4  PHOS 2.7 3.3 3.5  --  3.8 4.0   GFR: Estimated Creatinine Clearance: 48.3 mL/min (A) (by C-G formula based on SCr of 1.45 mg/dL (H)). Liver Function Tests: Recent Labs  Lab 06/21/20 0545 06/22/20 0600 06/23/20 0705 06/24/20 0259 06/25/20 0433 06/26/20 0237 06/27/20 0310  AST 49* 66* 60* 47* 33  --   --   ALT 20 26 30 30 29   --   --   ALKPHOS 77 149* 220* 226* 184*  --   --   BILITOT 0.9 0.9 1.3* 1.6* 1.2  --   --   PROT 7.2 6.9 6.3* 6.7 6.2*  --   --   ALBUMIN 2.9* 2.8* 2.7* 2.9* 2.7* 2.6* 2.4*   No results for input(s): LIPASE, AMYLASE in the last 168 hours. No results for input(s): AMMONIA in the last 168 hours. Coagulation Profile: No results for input(s): INR, PROTIME in the last 168 hours. Cardiac Enzymes: No results for input(s): CKTOTAL, CKMB, CKMBINDEX, TROPONINI in the last 168 hours. BNP (last 3 results) No results for input(s): PROBNP in the last 8760 hours. HbA1C: No results for input(s):  HGBA1C in the last 72 hours. CBG: Recent Labs  Lab 06/27/20 0228 06/27/20 0406 06/27/20 0512 06/27/20 0717 06/27/20 1125  GLUCAP 81 68* 125* 151* 221*   Lipid Profile: No results for input(s): CHOL, HDL, LDLCALC, TRIG, CHOLHDL, LDLDIRECT in the last 72 hours. Thyroid Function Tests: No results for input(s): TSH, T4TOTAL, FREET4, T3FREE, THYROIDAB in the last 72 hours. Anemia Panel: No results for input(s): VITAMINB12, FOLATE, FERRITIN, TIBC, IRON, RETICCTPCT in the last 72 hours. Sepsis Labs: Recent Labs  Lab 06/20/20 2056  PROCALCITON <0.10    Recent Results (from the past 240 hour(s))  MRSA PCR Screening     Status: None   Collection Time: 06/23/20 12:53 AM   Specimen: Nasopharyngeal  Result  Value Ref Range Status   MRSA by PCR NEGATIVE NEGATIVE Final    Comment:        The GeneXpert MRSA Assay (FDA approved for NASAL specimens only), is one component of a comprehensive MRSA colonization surveillance program. It is not intended to diagnose MRSA infection nor to guide or monitor treatment for MRSA infections. Performed at [facility], [address] [place], [place] [address]          Radiology Studies: No results found.      Scheduled Meds: . albuterol  2 puff Inhalation Q6H  . vitamin C  500 mg Oral Daily  . aspirin EC  81 mg Oral Daily  . atorvastatin  80 mg Oral Daily  . baricitinib  2 mg Oral Daily  . Chlorhexidine Gluconate Cloth  6 each Topical Daily  . enoxaparin (LOVENOX) injection  0.5 mg/kg Subcutaneous Q24H  . insulin aspart  0-20 Units Subcutaneous TID AC & HS  . insulin detemir  10 Units Subcutaneous QHS  . linagliptin  5 mg Oral Daily  . predniSONE  50 mg Oral Daily  . tamsulosin  0.4 mg Oral Daily  . zinc sulfate  220 mg Oral Daily   Continuous Infusions:   LOS: 7 days    Time spent: 25 minutes    Sidney Ace, MD [facility] Pager [phone]  If 7PM-7AM, please contact  night-coverage 06/27/2020, 2:12 PM

## 2020-06-28 DIAGNOSIS — J1282 Pneumonia due to coronavirus disease 2019: Secondary | ICD-10-CM | POA: Diagnosis not present

## 2020-06-28 DIAGNOSIS — U071 COVID-19: Secondary | ICD-10-CM | POA: Diagnosis not present

## 2020-06-28 LAB — HEMOGLOBIN A1C
Hgb A1c MFr Bld: 8.4 % — ABNORMAL HIGH (ref 4.8–5.6)
Mean Plasma Glucose: 194.38 mg/dL

## 2020-06-28 LAB — GLUCOSE, CAPILLARY
Glucose-Capillary: 192 mg/dL — ABNORMAL HIGH (ref 70–99)
Glucose-Capillary: 265 mg/dL — ABNORMAL HIGH (ref 70–99)
Glucose-Capillary: 300 mg/dL — ABNORMAL HIGH (ref 70–99)
Glucose-Capillary: 389 mg/dL — ABNORMAL HIGH (ref 70–99)
Glucose-Capillary: 404 mg/dL — ABNORMAL HIGH (ref 70–99)
Glucose-Capillary: 68 mg/dL — ABNORMAL LOW (ref 70–99)
Glucose-Capillary: 79 mg/dL (ref 70–99)
Glucose-Capillary: 94 mg/dL (ref 70–99)

## 2020-06-28 LAB — RENAL FUNCTION PANEL
Albumin: 2.4 g/dL — ABNORMAL LOW (ref 3.5–5.0)
Anion gap: 10 (ref 5–15)
BUN: 47 mg/dL — ABNORMAL HIGH (ref 8–23)
CO2: 31 mmol/L (ref 22–32)
Calcium: 8.1 mg/dL — ABNORMAL LOW (ref 8.9–10.3)
Chloride: 99 mmol/L (ref 98–111)
Creatinine, Ser: 1.38 mg/dL — ABNORMAL HIGH (ref 0.61–1.24)
GFR, Estimated: 52 mL/min — ABNORMAL LOW (ref >60)
Glucose, Bld: 65 mg/dL — ABNORMAL LOW (ref 70–99)
Phosphorus: 3.2 mg/dL (ref 2.5–4.6)
Potassium: 4.4 mmol/L (ref 3.5–5.1)
Sodium: 140 mmol/L (ref 135–145)

## 2020-06-28 LAB — MAGNESIUM: Magnesium: 2.4 mg/dL (ref 1.7–2.4)

## 2020-06-28 LAB — CBC WITH DIFFERENTIAL/PLATELET
Abs Immature Granulocytes: 0.24 10*3/uL — ABNORMAL HIGH (ref 0.00–0.07)
Basophils Absolute: 0 10*3/uL (ref 0.0–0.1)
Basophils Relative: 0 %
Eosinophils Absolute: 0.1 10*3/uL (ref 0.0–0.5)
Eosinophils Relative: 1 %
HCT: 41.7 % (ref 39.0–52.0)
Hemoglobin: 13.7 g/dL (ref 13.0–17.0)
Immature Granulocytes: 2 %
Lymphocytes Relative: 6 %
Lymphs Abs: 0.8 10*3/uL (ref 0.7–4.0)
MCH: 29.2 pg (ref 26.0–34.0)
MCHC: 32.9 g/dL (ref 30.0–36.0)
MCV: 88.9 fL (ref 80.0–100.0)
Monocytes Absolute: 0.7 10*3/uL (ref 0.1–1.0)
Monocytes Relative: 5 %
Neutro Abs: 10.9 10*3/uL — ABNORMAL HIGH (ref 1.7–7.7)
Neutrophils Relative %: 86 %
Platelets: 252 10*3/uL (ref 150–400)
RBC: 4.69 MIL/uL (ref 4.22–5.81)
RDW: 13.2 % (ref 11.5–15.5)
WBC: 12.6 10*3/uL — ABNORMAL HIGH (ref 4.0–10.5)
nRBC: 0 % (ref 0.0–0.2)

## 2020-06-28 MED ORDER — FUROSEMIDE 10 MG/ML IJ SOLN
40.0000 mg | Freq: Once | INTRAMUSCULAR | Status: AC
  Administered 2020-06-28: 40 mg via INTRAVENOUS
  Filled 2020-06-28: qty 4

## 2020-06-28 MED ORDER — INSULIN ASPART 100 UNIT/ML ~~LOC~~ SOLN
0.0000 [IU] | Freq: Every day | SUBCUTANEOUS | Status: DC
  Administered 2020-06-28: 5 [IU] via SUBCUTANEOUS
  Administered 2020-06-29: 3 [IU] via SUBCUTANEOUS
  Administered 2020-07-01: 4 [IU] via SUBCUTANEOUS
  Administered 2020-07-02: 2 [IU] via SUBCUTANEOUS
  Administered 2020-07-03: 4 [IU] via SUBCUTANEOUS
  Administered 2020-07-04: 5 [IU] via SUBCUTANEOUS
  Administered 2020-07-06: 3 [IU] via SUBCUTANEOUS
  Administered 2020-07-09: 2 [IU] via SUBCUTANEOUS
  Administered 2020-07-11 – 2020-07-13 (×3): 3 [IU] via SUBCUTANEOUS
  Administered 2020-07-15: 5 [IU] via SUBCUTANEOUS
  Administered 2020-07-16: 2 [IU] via SUBCUTANEOUS
  Administered 2020-07-17: 4 [IU] via SUBCUTANEOUS
  Administered 2020-07-18: 3 [IU] via SUBCUTANEOUS
  Filled 2020-06-28 (×15): qty 1

## 2020-06-28 MED ORDER — INSULIN ASPART 100 UNIT/ML ~~LOC~~ SOLN: 0.0000 [IU] | Freq: Three times a day (TID) | SUBCUTANEOUS | Status: DC

## 2020-06-28 MED ORDER — INSULIN ASPART 100 UNIT/ML ~~LOC~~ SOLN
0.0000 [IU] | Freq: Three times a day (TID) | SUBCUTANEOUS | Status: DC
  Administered 2020-06-29: 7 [IU] via SUBCUTANEOUS
  Administered 2020-06-29: 4 [IU] via SUBCUTANEOUS
  Administered 2020-06-29: 11 [IU] via SUBCUTANEOUS
  Administered 2020-06-30: 3 [IU] via SUBCUTANEOUS
  Administered 2020-06-30 (×2): 7 [IU] via SUBCUTANEOUS
  Administered 2020-07-01: 20 [IU] via SUBCUTANEOUS
  Administered 2020-07-01: 11 [IU] via SUBCUTANEOUS
  Administered 2020-07-02: 4 [IU] via SUBCUTANEOUS
  Administered 2020-07-02 (×2): 7 [IU] via SUBCUTANEOUS
  Administered 2020-07-03 (×2): 4 [IU] via SUBCUTANEOUS
  Administered 2020-07-03: 11 [IU] via SUBCUTANEOUS
  Administered 2020-07-04: 4 [IU] via SUBCUTANEOUS
  Administered 2020-07-04: 7 [IU] via SUBCUTANEOUS
  Administered 2020-07-04: 3 [IU] via SUBCUTANEOUS
  Administered 2020-07-05: 7 [IU] via SUBCUTANEOUS
  Administered 2020-07-05: 4 [IU] via SUBCUTANEOUS
  Administered 2020-07-06: 3 [IU] via SUBCUTANEOUS
  Administered 2020-07-06: 7 [IU] via SUBCUTANEOUS
  Administered 2020-07-07: 15 [IU] via SUBCUTANEOUS
  Administered 2020-07-07: 4 [IU] via SUBCUTANEOUS
  Administered 2020-07-07: 7 [IU] via SUBCUTANEOUS
  Administered 2020-07-08: 11 [IU] via SUBCUTANEOUS
  Administered 2020-07-08 – 2020-07-09 (×4): 7 [IU] via SUBCUTANEOUS
  Administered 2020-07-10: 11 [IU] via SUBCUTANEOUS
  Administered 2020-07-10: 7 [IU] via SUBCUTANEOUS
  Administered 2020-07-10: 4 [IU] via SUBCUTANEOUS
  Administered 2020-07-11: 15 [IU] via SUBCUTANEOUS
  Administered 2020-07-11: 20 [IU] via SUBCUTANEOUS
  Administered 2020-07-11: 15 [IU] via SUBCUTANEOUS
  Administered 2020-07-12: 20 [IU] via SUBCUTANEOUS
  Administered 2020-07-12: 11 [IU] via SUBCUTANEOUS
  Administered 2020-07-12: 20 [IU] via SUBCUTANEOUS
  Administered 2020-07-13: 3 [IU] via SUBCUTANEOUS
  Administered 2020-07-13 (×2): 4 [IU] via SUBCUTANEOUS
  Administered 2020-07-14: 7 [IU] via SUBCUTANEOUS
  Administered 2020-07-14: 11 [IU] via SUBCUTANEOUS
  Administered 2020-07-15: 4 [IU] via SUBCUTANEOUS
  Administered 2020-07-15: 11 [IU] via SUBCUTANEOUS
  Administered 2020-07-15: 4 [IU] via SUBCUTANEOUS
  Administered 2020-07-16: 15 [IU] via SUBCUTANEOUS
  Administered 2020-07-16: 4 [IU] via SUBCUTANEOUS
  Administered 2020-07-16: 15 [IU] via SUBCUTANEOUS
  Administered 2020-07-17: 4 [IU] via SUBCUTANEOUS
  Administered 2020-07-17: 11 [IU] via SUBCUTANEOUS
  Administered 2020-07-18 (×2): 4 [IU] via SUBCUTANEOUS
  Administered 2020-07-19: 15 [IU] via SUBCUTANEOUS
  Administered 2020-07-19: 4 [IU] via SUBCUTANEOUS
  Filled 2020-06-28 (×48): qty 1

## 2020-06-28 MED ORDER — INSULIN DETEMIR 100 UNIT/ML ~~LOC~~ SOLN
8.0000 [IU] | Freq: Every day | SUBCUTANEOUS | Status: DC
  Administered 2020-06-28: 8 [IU] via SUBCUTANEOUS
  Filled 2020-06-28: qty 0.08

## 2020-06-28 MED ORDER — INSULIN ASPART 100 UNIT/ML ~~LOC~~ SOLN
3.0000 [IU] | Freq: Three times a day (TID) | SUBCUTANEOUS | Status: DC
  Administered 2020-06-28 (×2): 3 [IU] via SUBCUTANEOUS
  Filled 2020-06-28 (×2): qty 1

## 2020-06-28 NOTE — Progress Notes (Signed)
Inpatient Diabetes Program Recommendations  AACE/ADA: New Consensus Statement on Inpatient Glycemic Control  Target Ranges:  Prepandial:   less than 140 mg/dL      Peak postprandial:   less than 180 mg/dL (1-2 hours)      Critically ill patients:  140 - 180 mg/dL   Results for RAYMAR, JOINER (MRN [id number]) as of 06/28/2020 07:25  Ref. Range 06/27/2020 07:17 06/27/2020 11:25 06/27/2020 16:38 06/27/2020 19:41 06/27/2020 23:08 06/28/2020 03:37 06/28/2020 07:22  Glucose-Capillary Latest Ref Range: 70 - 99 mg/dL 151 (H)  Novolog 4 units 221 (H)  Novolog 7 units 405 (H)  Novolog 20 units 423 (H)  Novolog 8 units 213 (H)  Novolog 7 units  Levemir 10 units 79 68 (L)   Review of Glycemic Control  Current orders for Inpatient glycemic control: Levemir 10 units QHS, Novolog 0-20 units AC&HS, Tradjenta 5 mg daily; Prednisone 50 mg daily  Inpatient Diabetes Program Recommendations:    Insulin: Please consider changing bedtime correction to Novolog 0-5 units QHS.  If steroids are continued, please consider ordering Novolog 5 units TID with meals for meal coverage if patient eats at least 50% of meals.  Thanks, Barnie Alderman, RN, MSN, CDE Diabetes Coordinator Inpatient Diabetes Program [phone] (Team Pager from 8am to 5pm)

## 2020-06-28 NOTE — Progress Notes (Signed)
Better day. Now on 15 LNC. Grumpy all day.

## 2020-06-28 NOTE — Progress Notes (Signed)
PROGRESS NOTE    Scott Lawson.  GUY:[id number] DOB: 24-Mar-1941 DOA: 06/20/2020 PCP: Venita Lick, NP    Brief Narrative:  80 y.o.malewith medical history significant fordiabetes, BPH, HTN, HLD, obesity and GERD, who presents to the emergency room with a5-dayhistory of generalized malaise associated with lightheadedness and thenover the past 24 hours,started feeling short of breath. He denies chest pain, fever or chills, vomiting or diarrhea or abdominal pain. He is unvaccinated against COVIDand states he is not interested in getting the vaccine  Patient dependent on heated high flow between 50 and 55 L.  Unable to wean past this point.  Hypoglycemia noted in the mornings.  Adjustments made in diabetic regimen.  AM hypoglycemia improved.  Per documentation patient weaned to 15 L high flow nasal cannula.   Assessment & Plan:   Principal Problem:   Pneumonia due to COVID-19 virus Active Problems:   Acute renal failure superimposed on stage 3a chronic kidney disease (HCC)   Hyperglycemia due to type 2 diabetes mellitus (HCC)   Generalized weakness   Acute respiratory failure due to COVID-19 (HCC)   Obesity (BMI 30-39.9)  Pneumonia due to COVID-19 virus Acute respiratory failure due to COVID-19  -Patient with shortness of breath, increased work of breathing speaking in short sentences O2 sat 48% on arrival  And then on heated high flow at 50 L to keep sats in the low 90s, and chest x-ray showing multifocal opacities -1/6--1/10 Remdesivir for 5 days as per pharmacy  -Baricitinib for 14 days, methylprednisolone, albuterol, antitussives and vitamins -Oxygen delivery system to maintain sats over 88% -Weaned to 15 L as of 06/28/2020 Plan: Continue high rate oxygen Prone as tolerated Continue baricitinib Lasix 40 mg IV x1.  Reassess daily for diuretic need and tolerance  Acute renal failure superimposed on stage 3a chronic kidney disease -creatinine 1.61 up from  baseline of 1.13 Cr 1.38--1.33--1.5--1.38 Continue monitor renal function  Hyperglycemia due to type 2 diabetes mellitus (HCC) A.m. hypoglycemia -Decrease basal insulin to 8 units nightly -Sliding scale insulin coverage  Generalized weaknessrelated to COVID -Nutritionist consult  Obesity (BMI 56-38.7) -Complicating factor to prognosis   DVT prophylaxis: Lovenox Code Status: Full Family Communication: None today Disposition Plan: Status is: Inpatient  Remains inpatient appropriate because:Inpatient level of care appropriate due to severity of illness   Dispo: The patient is from: Home              Anticipated d/c is to: SNF              Anticipated d/c date is: > 3 days              Patient currently is not medically stable to d/c.   Seems to be making improvement.  Weaned to 15 L as of today.  If he stays on this rate can transfer out of ICU tomorrow.   Consultants:   PCCM  Procedures:   None  Antimicrobials:   Plan   Subjective: Patient seen and examined.  In reasonably good spirits this morning.  Normal work of breathing.  No pain complaints.  Objective: Vitals:   06/28/20 0800 06/28/20 0900 06/28/20 1000 06/28/20 1200  BP: 122/62 112/64 (!) 99/57   Pulse: (!) 55 64 79 (!) 106  Resp:    19  Temp:      TempSrc:      SpO2: 93% 91% 93% 95%  Weight:      Height:        Intake/Output  Summary (Last 24 hours) at 06/28/2020 1347 Last data filed at 06/28/2020 1100 Gross per 24 hour  Intake -  Output 1450 ml  Net -1450 ml   Filed Weights   06/20/20 1944 06/23/20 0100  Weight: 104.4 kg 97.5 kg    Examination:  General exam: Appears calm and comfortable  Respiratory system: Coarse crackles bilaterally.  Normal work of breathing.  15 L Cardiovascular system: S1 & S2 heard, RRR. No JVD, murmurs, rubs, gallops or clicks. No pedal edema. Gastrointestinal system: Abdomen is nondistended, soft and nontender. No organomegaly or masses felt.  Normal bowel sounds heard. Central nervous system: Alert and oriented. No focal neurological deficits. Extremities: Symmetric 5 x 5 power. Skin: No rashes, lesions or ulcers Psychiatry: Judgement and insight appear normal. Mood & affect appropriate.     Data Reviewed: I have personally reviewed following labs and imaging studies  CBC: Recent Labs  Lab 06/24/20 0259 06/25/20 0433 06/26/20 0237 06/27/20 0310 06/28/20 0342  WBC 16.4* 17.7* 12.1* 10.5 12.6*  NEUTROABS 14.7* 14.4* 10.6* 9.3* 10.9*  HGB 14.7 14.6 13.9 13.8 13.7  HCT 42.8 43.5 42.5 42.3 41.7  MCV 88.4 88.4 88.7 88.5 88.9  PLT 177 200 178 206 119   Basic Metabolic Panel: Recent Labs  Lab 06/23/20 0705 06/24/20 0259 06/25/20 0433 06/26/20 0237 06/27/20 0310 06/27/20 1752 06/28/20 0342  NA 142 141 141 138 140  --  140  K 3.9 3.9 3.9 3.7 4.3  --  4.4  CL 106 97* 99 97* 99  --  99  CO2 25 29 28 31 31   --  31  GLUCOSE 101* 110* 40* 53* 68* 347* 65*  BUN 42* 47* 47* 41* 46*  --  47*  CREATININE 1.30* 1.33* 1.33* 1.32* 1.51*  1.45*  --  1.38*  CALCIUM 8.6* 8.5* 8.4* 7.9* 8.0*  --  8.1*  MG 2.2 2.2  --  2.1 2.4  --  2.4  PHOS 3.3 3.5  --  3.8 4.0  --  3.2   GFR: Estimated Creatinine Clearance: 50.8 mL/min (A) (by C-G formula based on SCr of 1.38 mg/dL (H)). Liver Function Tests: Recent Labs  Lab 06/22/20 0600 06/23/20 0705 06/24/20 0259 06/25/20 0433 06/26/20 0237 06/27/20 0310 06/28/20 0342  AST 66* 60* 47* 33  --   --   --   ALT 26 30 30 29   --   --   --   ALKPHOS 149* 220* 226* 184*  --   --   --   BILITOT 0.9 1.3* 1.6* 1.2  --   --   --   PROT 6.9 6.3* 6.7 6.2*  --   --   --   ALBUMIN 2.8* 2.7* 2.9* 2.7* 2.6* 2.4* 2.4*   No results for input(s): LIPASE, AMYLASE in the last 168 hours. No results for input(s): AMMONIA in the last 168 hours. Coagulation Profile: No results for input(s): INR, PROTIME in the last 168 hours. Cardiac Enzymes: No results for input(s): CKTOTAL, CKMB, CKMBINDEX,  TROPONINI in the last 168 hours. BNP (last 3 results) No results for input(s): PROBNP in the last 8760 hours. HbA1C: Recent Labs    06/28/20 0342  HGBA1C 8.4*   CBG: Recent Labs  Lab 06/27/20 2308 06/28/20 0337 06/28/20 0722 06/28/20 0736 06/28/20 1145  GLUCAP 213* 79 68* 94 192*   Lipid Profile: No results for input(s): CHOL, HDL, LDLCALC, TRIG, CHOLHDL, LDLDIRECT in the last 72 hours. Thyroid Function Tests: No results for input(s): TSH, T4TOTAL, FREET4,  T3FREE, THYROIDAB in the last 72 hours. Anemia Panel: No results for input(s): VITAMINB12, FOLATE, FERRITIN, TIBC, IRON, RETICCTPCT in the last 72 hours. Sepsis Labs: No results for input(s): PROCALCITON, LATICACIDVEN in the last 168 hours.  Recent Results (from the past 240 hour(s))  MRSA PCR Screening     Status: None   Collection Time: 06/23/20 12:53 AM   Specimen: Nasopharyngeal  Result Value Ref Range Status   MRSA by PCR NEGATIVE NEGATIVE Final    Comment:        The GeneXpert MRSA Assay (FDA approved for NASAL specimens only), is one component of a comprehensive MRSA colonization surveillance program. It is not intended to diagnose MRSA infection nor to guide or monitor treatment for MRSA infections. Performed at [facility], [address] [place], [place] [address]          Radiology Studies: No results found.      Scheduled Meds: . albuterol  2 puff Inhalation Q6H  . vitamin C  500 mg Oral Daily  . aspirin EC  81 mg Oral Daily  . atorvastatin  80 mg Oral Daily  . baricitinib  2 mg Oral Daily  . Chlorhexidine Gluconate Cloth  6 each Topical Daily  . enoxaparin (LOVENOX) injection  0.5 mg/kg Subcutaneous Q24H  . insulin aspart  0-20 Units Subcutaneous TID AC & HS  . insulin aspart  0-5 Units Subcutaneous QHS  . insulin aspart  3 Units Subcutaneous TID WC  . insulin detemir  8 Units Subcutaneous QHS  . linagliptin  5 mg Oral Daily  . predniSONE  50 mg Oral Daily  .  tamsulosin  0.4 mg Oral Daily  . zinc sulfate  220 mg Oral Daily   Continuous Infusions:   LOS: 8 days    Time spent: 15 minutes    Sidney Ace, MD [facility] Pager [phone]  If 7PM-7AM, please contact night-coverage 06/28/2020, 1:47 PM

## 2020-06-29 DIAGNOSIS — U071 COVID-19: Secondary | ICD-10-CM | POA: Diagnosis not present

## 2020-06-29 DIAGNOSIS — J1282 Pneumonia due to coronavirus disease 2019: Secondary | ICD-10-CM | POA: Diagnosis not present

## 2020-06-29 LAB — RENAL FUNCTION PANEL
Albumin: 2.5 g/dL — ABNORMAL LOW (ref 3.5–5.0)
Anion gap: 12 (ref 5–15)
BUN: 45 mg/dL — ABNORMAL HIGH (ref 8–23)
CO2: 29 mmol/L (ref 22–32)
Calcium: 8.4 mg/dL — ABNORMAL LOW (ref 8.9–10.3)
Chloride: 95 mmol/L — ABNORMAL LOW (ref 98–111)
Creatinine, Ser: 1.28 mg/dL — ABNORMAL HIGH (ref 0.61–1.24)
GFR, Estimated: 57 mL/min — ABNORMAL LOW (ref >60)
Glucose, Bld: 247 mg/dL — ABNORMAL HIGH (ref 70–99)
Phosphorus: 2.9 mg/dL (ref 2.5–4.6)
Potassium: 4.6 mmol/L (ref 3.5–5.1)
Sodium: 136 mmol/L (ref 135–145)

## 2020-06-29 LAB — CBC WITH DIFFERENTIAL/PLATELET
Abs Immature Granulocytes: 0.24 10*3/uL — ABNORMAL HIGH (ref 0.00–0.07)
Basophils Absolute: 0 10*3/uL (ref 0.0–0.1)
Basophils Relative: 0 %
Eosinophils Absolute: 0.1 10*3/uL (ref 0.0–0.5)
Eosinophils Relative: 1 %
HCT: 42.4 % (ref 39.0–52.0)
Hemoglobin: 14.2 g/dL (ref 13.0–17.0)
Immature Granulocytes: 2 %
Lymphocytes Relative: 5 %
Lymphs Abs: 0.8 10*3/uL (ref 0.7–4.0)
MCH: 29.6 pg (ref 26.0–34.0)
MCHC: 33.5 g/dL (ref 30.0–36.0)
MCV: 88.3 fL (ref 80.0–100.0)
Monocytes Absolute: 0.9 10*3/uL (ref 0.1–1.0)
Monocytes Relative: 6 %
Neutro Abs: 12.3 10*3/uL — ABNORMAL HIGH (ref 1.7–7.7)
Neutrophils Relative %: 86 %
Platelets: 295 10*3/uL (ref 150–400)
RBC: 4.8 MIL/uL (ref 4.22–5.81)
RDW: 13 % (ref 11.5–15.5)
WBC: 14.2 10*3/uL — ABNORMAL HIGH (ref 4.0–10.5)
nRBC: 0 % (ref 0.0–0.2)

## 2020-06-29 LAB — GLUCOSE, CAPILLARY
Glucose-Capillary: 315 mg/dL — ABNORMAL HIGH (ref 70–99)
Glucose-Capillary: 210 mg/dL — ABNORMAL HIGH (ref 70–99)
Glucose-Capillary: 191 mg/dL — ABNORMAL HIGH (ref 70–99)
Glucose-Capillary: 244 mg/dL — ABNORMAL HIGH (ref 70–99)
Glucose-Capillary: 263 mg/dL — ABNORMAL HIGH (ref 70–99)

## 2020-06-29 LAB — MAGNESIUM: Magnesium: 2.3 mg/dL (ref 1.7–2.4)

## 2020-06-29 MED ORDER — INSULIN DETEMIR 100 UNIT/ML ~~LOC~~ SOLN: 5.0000 [IU] | Freq: Every day | SUBCUTANEOUS | Status: DC

## 2020-06-29 MED ORDER — INSULIN DETEMIR 100 UNIT/ML ~~LOC~~ SOLN
9.0000 [IU] | Freq: Every day | SUBCUTANEOUS | Status: DC
  Administered 2020-06-29 – 2020-07-04 (×6): 9 [IU] via SUBCUTANEOUS
  Filled 2020-06-29 (×7): qty 0.09

## 2020-06-29 MED ORDER — INSULIN DETEMIR 100 UNIT/ML ~~LOC~~ SOLN
8.0000 [IU] | Freq: Every day | SUBCUTANEOUS | Status: DC
  Filled 2020-06-29: qty 0.08

## 2020-06-29 MED ORDER — ENOXAPARIN SODIUM 60 MG/0.6ML ~~LOC~~ SOLN
0.5000 mg/kg | SUBCUTANEOUS | Status: DC
  Administered 2020-06-29 – 2020-07-06 (×8): 45 mg via SUBCUTANEOUS
  Filled 2020-06-29 (×8): qty 0.6

## 2020-06-29 MED ORDER — INSULIN ASPART 100 UNIT/ML ~~LOC~~ SOLN
4.0000 [IU] | Freq: Three times a day (TID) | SUBCUTANEOUS | Status: DC
  Administered 2020-06-29 – 2020-07-02 (×11): 4 [IU] via SUBCUTANEOUS
  Filled 2020-06-29 (×9): qty 1

## 2020-06-29 NOTE — Progress Notes (Signed)
PROGRESS NOTE    Scott Lawson.  BJY:[id number] DOB: 02-21-1941 DOA: 06/20/2020 PCP: Venita Lick, NP    Brief Narrative:  80 y.o.malewith medical history significant fordiabetes, BPH, HTN, HLD, obesity and GERD, who presents to the emergency room with a5-dayhistory of generalized malaise associated with lightheadedness and thenover the past 24 hours,started feeling short of breath. He denies chest pain, fever or chills, vomiting or diarrhea or abdominal pain. He is unvaccinated against COVIDand states he is not interested in getting the vaccine  Patient dependent on heated high flow between 50 and 55 L.  Unable to wean past this point.  Hypoglycemia noted in the mornings.  Adjustments made in diabetic regimen.  AM hypoglycemia has improved.  Patient is now been weaned to 12 L high flow nasal cannula and transferred to progressive cardiac floor   Assessment & Plan:   Principal Problem:   Pneumonia due to COVID-19 virus Active Problems:   Acute renal failure superimposed on stage 3a chronic kidney disease (HCC)   Hyperglycemia due to type 2 diabetes mellitus (HCC)   Generalized weakness   Acute respiratory failure due to COVID-19 (HCC)   Obesity (BMI 30-39.9)  Pneumonia due to COVID-19 virus Acute respiratory failure due to COVID-19  -Patient with shortness of breath, increased work of breathing speaking in short sentences O2 sat 48% on arrival  And then on heated high flow at 50 L to keep sats in the low 90s, and chest x-ray showing multifocal opacities -1/6--1/10 Remdesivir for 5 days as per pharmacy  -Baricitinib for 14 days, methylprednisolone, albuterol, antitussives and vitamins -Oxygen delivery system to maintain sats over 88% -Weaned to 12 L high flow as of 06/29/2020 Plan: Continue high rate oxygen, titrate as tolerated Prone as tolerated Continue baricitinib Repeat Lasix 40 mg IV x1.  Reassess daily for diuretic need intolerance  Acute renal  failure superimposed on stage 3a chronic kidney disease -creatinine 1.61 up from baseline of 1.13 Cr 1.38--1.33--1.5--1.38 Continue monitor renal function  Hyperglycemia due to type 2 diabetes mellitus (HCC) A.m. hypoglycemia -Decrease basal insulin to 8 units nightly -Sliding scale insulin coverage  Generalized weaknessrelated to COVID -Nutritionist consult  Obesity (BMI 08-65.7) -Complicating factor to prognosis   DVT prophylaxis: Lovenox Code Status: Full Family Communication: None today Disposition Plan: Status is: Inpatient  Remains inpatient appropriate because:Inpatient level of care appropriate due to severity of illness   Dispo: The patient is from: Home              Anticipated d/c is to: SNF              Anticipated d/c date is: 2 days              Patient currently is not medically stable to d/c.   Making improvements steadily.  Weaned to a 12 liters as of today.  Hopefully will require 2 to 3 days additional prior to disposition planning.  Will request therapy evaluations.   Consultants:   PCCM  Procedures:   None  Antimicrobials:   Plan   Subjective: Patient seen and examined.  Sleeping but easily awakened.  Normal work of breathing.  12 L.  No complaints  Objective: Vitals:   06/29/20 0200 06/29/20 0529 06/29/20 0644 06/29/20 0909  BP: 113/71 118/62  107/60  Pulse: (!) 58 74  81  Resp: 18 20  (!) 21  Temp: 97.9 F (36.6 C) 98.1 F (36.7 C)  98.4 F (36.9 C)  TempSrc: Oral Oral  Oral  SpO2: 97% 92% (!) 81%   Weight:      Height:        Intake/Output Summary (Last 24 hours) at 06/29/2020 1237 Last data filed at 06/29/2020 0546 Gross per 24 hour  Intake 350 ml  Output 1610 ml  Net -1260 ml   Filed Weights   06/20/20 1944 06/23/20 0100 06/28/20 2210  Weight: 104.4 kg 97.5 kg 92.4 kg    Examination:  General exam: Appears calm and comfortable  Respiratory system: Coarse crackles bilaterally.  Normal work of  breathing.  15 L Cardiovascular system: S1 & S2 heard, RRR. No JVD, murmurs, rubs, gallops or clicks. No pedal edema. Gastrointestinal system: Abdomen is nondistended, soft and nontender. No organomegaly or masses felt. Normal bowel sounds heard. Central nervous system: Alert and oriented. No focal neurological deficits. Extremities: Symmetric 5 x 5 power. Skin: No rashes, lesions or ulcers Psychiatry: Judgement and insight appear normal. Mood & affect appropriate.     Data Reviewed: I have personally reviewed following labs and imaging studies  CBC: Recent Labs  Lab 06/25/20 0433 06/26/20 0237 06/27/20 0310 06/28/20 0342 06/29/20 0438  WBC 17.7* 12.1* 10.5 12.6* 14.2*  NEUTROABS 14.4* 10.6* 9.3* 10.9* 12.3*  HGB 14.6 13.9 13.8 13.7 14.2  HCT 43.5 42.5 42.3 41.7 42.4  MCV 88.4 88.7 88.5 88.9 88.3  PLT 200 178 206 252 841   Basic Metabolic Panel: Recent Labs  Lab 06/24/20 0259 06/25/20 0433 06/26/20 0237 06/27/20 0310 06/27/20 1752 06/28/20 0342 06/29/20 0438  NA 141 141 138 140  --  140 136  K 3.9 3.9 3.7 4.3  --  4.4 4.6  CL 97* 99 97* 99  --  99 95*  CO2 29 28 31 31   --  31 29  GLUCOSE 110* 40* 53* 68* 347* 65* 247*  BUN 47* 47* 41* 46*  --  47* 45*  CREATININE 1.33* 1.33* 1.32* 1.51*  1.45*  --  1.38* 1.28*  CALCIUM 8.5* 8.4* 7.9* 8.0*  --  8.1* 8.4*  MG 2.2  --  2.1 2.4  --  2.4 2.3  PHOS 3.5  --  3.8 4.0  --  3.2 2.9   GFR: Estimated Creatinine Clearance: 53.4 mL/min (A) (by C-G formula based on SCr of 1.28 mg/dL (H)). Liver Function Tests: Recent Labs  Lab 06/23/20 0705 06/24/20 0259 06/25/20 0433 06/26/20 0237 06/27/20 0310 06/28/20 0342 06/29/20 0438  AST 60* 47* 33  --   --   --   --   ALT 30 30 29   --   --   --   --   ALKPHOS 220* 226* 184*  --   --   --   --   BILITOT 1.3* 1.6* 1.2  --   --   --   --   PROT 6.3* 6.7 6.2*  --   --   --   --   ALBUMIN 2.7* 2.9* 2.7* 2.6* 2.4* 2.4* 2.5*   No results for input(s): LIPASE, AMYLASE in the last  168 hours. No results for input(s): AMMONIA in the last 168 hours. Coagulation Profile: No results for input(s): INR, PROTIME in the last 168 hours. Cardiac Enzymes: No results for input(s): CKTOTAL, CKMB, CKMBINDEX, TROPONINI in the last 168 hours. BNP (last 3 results) No results for input(s): PROBNP in the last 8760 hours. HbA1C: Recent Labs    06/28/20 0342  HGBA1C 8.4*   CBG: Recent Labs  Lab 06/28/20 1950 06/28/20 2214 06/28/20 2345 06/29/20  0531 06/29/20 0908  GLUCAP 389* 300* 265* 315* 210*   Lipid Profile: No results for input(s): CHOL, HDL, LDLCALC, TRIG, CHOLHDL, LDLDIRECT in the last 72 hours. Thyroid Function Tests: No results for input(s): TSH, T4TOTAL, FREET4, T3FREE, THYROIDAB in the last 72 hours. Anemia Panel: No results for input(s): VITAMINB12, FOLATE, FERRITIN, TIBC, IRON, RETICCTPCT in the last 72 hours. Sepsis Labs: No results for input(s): PROCALCITON, LATICACIDVEN in the last 168 hours.  Recent Results (from the past 240 hour(s))  MRSA PCR Screening     Status: None   Collection Time: 06/23/20 12:53 AM   Specimen: Nasopharyngeal  Result Value Ref Range Status   MRSA by PCR NEGATIVE NEGATIVE Final    Comment:        The GeneXpert MRSA Assay (FDA approved for NASAL specimens only), is one component of a comprehensive MRSA colonization surveillance program. It is not intended to diagnose MRSA infection nor to guide or monitor treatment for MRSA infections. Performed at [facility], [address], [place], [place] [address]          Radiology Studies: No results found.      Scheduled Meds: . albuterol  2 puff Inhalation Q6H  . vitamin C  500 mg Oral Daily  . aspirin EC  81 mg Oral Daily  . atorvastatin  80 mg Oral Daily  . baricitinib  2 mg Oral Daily  . Chlorhexidine Gluconate Cloth  6 each Topical Daily  . enoxaparin (LOVENOX) injection  0.5 mg/kg Subcutaneous Q24H  . insulin aspart  0-20 Units Subcutaneous TID  AC  . insulin aspart  0-5 Units Subcutaneous QHS  . insulin aspart  4 Units Subcutaneous TID WC  . insulin detemir  9 Units Subcutaneous QHS  . linagliptin  5 mg Oral Daily  . predniSONE  50 mg Oral Daily  . tamsulosin  0.4 mg Oral Daily  . zinc sulfate  220 mg Oral Daily   Continuous Infusions:   LOS: 9 days    Time spent: 15 minutes    Sidney Ace, MD [facility] Pager [phone]  If 7PM-7AM, please contact night-coverage 06/29/2020, 12:37 PM

## 2020-06-29 NOTE — Progress Notes (Deleted)
Cross Cover AM hypoglycemia. Further reduced basal insulin to 5 units

## 2020-06-29 NOTE — Progress Notes (Signed)
Patient blood sugar 315. Notified NP Randol Kern, no coverage given for the 315 blood sugar per Randol Kern.

## 2020-06-30 DIAGNOSIS — U071 COVID-19: Secondary | ICD-10-CM | POA: Diagnosis not present

## 2020-06-30 DIAGNOSIS — J1282 Pneumonia due to coronavirus disease 2019: Secondary | ICD-10-CM | POA: Diagnosis not present

## 2020-06-30 LAB — CBC WITH DIFFERENTIAL/PLATELET
Abs Immature Granulocytes: 0.35 10*3/uL — ABNORMAL HIGH (ref 0.00–0.07)
Basophils Absolute: 0 10*3/uL (ref 0.0–0.1)
Basophils Relative: 0 %
Eosinophils Absolute: 0.2 10*3/uL (ref 0.0–0.5)
Eosinophils Relative: 1 %
HCT: 43 % (ref 39.0–52.0)
Hemoglobin: 14.1 g/dL (ref 13.0–17.0)
Immature Granulocytes: 3 %
Lymphocytes Relative: 7 %
Lymphs Abs: 1 10*3/uL (ref 0.7–4.0)
MCH: 29.2 pg (ref 26.0–34.0)
MCHC: 32.8 g/dL (ref 30.0–36.0)
MCV: 89 fL (ref 80.0–100.0)
Monocytes Absolute: 0.9 10*3/uL (ref 0.1–1.0)
Monocytes Relative: 7 %
Neutro Abs: 11.4 10*3/uL — ABNORMAL HIGH (ref 1.7–7.7)
Neutrophils Relative %: 82 %
Platelets: 329 10*3/uL (ref 150–400)
RBC: 4.83 MIL/uL (ref 4.22–5.81)
RDW: 13 % (ref 11.5–15.5)
WBC: 13.9 10*3/uL — ABNORMAL HIGH (ref 4.0–10.5)
nRBC: 0 % (ref 0.0–0.2)

## 2020-06-30 LAB — RENAL FUNCTION PANEL
Albumin: 2.6 g/dL — ABNORMAL LOW (ref 3.5–5.0)
Anion gap: 11 (ref 5–15)
BUN: 36 mg/dL — ABNORMAL HIGH (ref 8–23)
CO2: 32 mmol/L (ref 22–32)
Calcium: 8.9 mg/dL (ref 8.9–10.3)
Chloride: 96 mmol/L — ABNORMAL LOW (ref 98–111)
Creatinine, Ser: 1.36 mg/dL — ABNORMAL HIGH (ref 0.61–1.24)
GFR, Estimated: 53 mL/min — ABNORMAL LOW (ref >60)
Glucose, Bld: 140 mg/dL — ABNORMAL HIGH (ref 70–99)
Phosphorus: 3.1 mg/dL (ref 2.5–4.6)
Potassium: 4.7 mmol/L (ref 3.5–5.1)
Sodium: 139 mmol/L (ref 135–145)

## 2020-06-30 LAB — GLUCOSE, CAPILLARY
Glucose-Capillary: 125 mg/dL — ABNORMAL HIGH (ref 70–99)
Glucose-Capillary: 166 mg/dL — ABNORMAL HIGH (ref 70–99)
Glucose-Capillary: 186 mg/dL — ABNORMAL HIGH (ref 70–99)
Glucose-Capillary: 206 mg/dL — ABNORMAL HIGH (ref 70–99)
Glucose-Capillary: 213 mg/dL — ABNORMAL HIGH (ref 70–99)
Glucose-Capillary: 242 mg/dL — ABNORMAL HIGH (ref 70–99)

## 2020-06-30 LAB — MAGNESIUM: Magnesium: 2.1 mg/dL (ref 1.7–2.4)

## 2020-06-30 NOTE — Progress Notes (Signed)
PROGRESS NOTE    Scott Lawson.  YW:[id number] DOB: Oct 16, 1940 DOA: 06/20/2020 PCP: Venita Lick, NP    Brief Narrative:  80 y.o.malewith medical history significant fordiabetes, BPH, HTN, HLD, obesity and GERD, who presents to the emergency room with a5-dayhistory of generalized malaise associated with lightheadedness and thenover the past 24 hours,started feeling short of breath. He denies chest pain, fever or chills, vomiting or diarrhea or abdominal pain. He is unvaccinated against COVIDand states he is not interested in getting the vaccine  Patient dependent on heated high flow between 50 and 55 L.  Unable to wean past this point.  Hypoglycemia noted in the mornings.  Adjustments made in diabetic regimen.  AM hypoglycemia has improved.  Patient is now been weaned to 12 L high flow nasal cannula and transferred to progressive cardiac floor.  1/16: Weaned to 7 L high flow.  In good spirits   Assessment & Plan:   Principal Problem:   Pneumonia due to COVID-19 virus Active Problems:   Acute renal failure superimposed on stage 3a chronic kidney disease (HCC)   Hyperglycemia due to type 2 diabetes mellitus (HCC)   Generalized weakness   Acute respiratory failure due to COVID-19 (HCC)   Obesity (BMI 30-39.9)  Pneumonia due to COVID-19 virus Acute respiratory failure due to COVID-19  -Patient with shortness of breath, increased work of breathing speaking in short sentences O2 sat 48% on arrival  And then on heated high flow at 50 L to keep sats in the low 90s, and chest x-ray showing multifocal opacities -1/6--1/10 Remdesivir for 5 days as per pharmacy  -Baricitinib for 14 days, methylprednisolone, albuterol, antitussives and vitamins -Oxygen delivery system to maintain sats over 88% -Weaned to 7 L high flow as of 06/30/2020 Plan: Continue high rate oxygen, titrate as tolerated Prone as tolerated Continue baricitinib Hold Lasix today  Acute renal failure  superimposed on stage 3a chronic kidney disease -creatinine 1.61 up from baseline of 1.13 Cr 1.38--1.33--1.5--1.38 Continue monitor renal function  Hyperglycemia due to type 2 diabetes mellitus (HCC) A.m. hypoglycemia -Decrease basal insulin to 8 units nightly -Sliding scale insulin coverage  Generalized weaknessrelated to COVID -Nutritionist consult  Obesity (BMI 0000000) -Complicating factor to prognosis   DVT prophylaxis: Lovenox Code Status: Full Family Communication: None today Disposition Plan: Status is: Inpatient  Remains inpatient appropriate because:Inpatient level of care appropriate due to severity of illness   Dispo: The patient is from: Home              Anticipated d/c is to: SNF              Anticipated d/c date is: 2 days              Patient currently is not medically stable to d/c.   Making daily improvements.  Weaned to 7 L high flow.  Hopefully will be able to wean to 2 to 3 L or less in the next 24 to 48 hours.  Anticipated disposition to skilled nursing facility   Consultants:   PCCM  Procedures:   None  Antimicrobials:   Plan   Subjective: Patient seen and examined.  Sitting up in bed.  No visible distress.  Normal work of breathing.  7 L.  No complaints  Objective: Vitals:   06/30/20 0404 06/30/20 0405 06/30/20 0835 06/30/20 1200  BP:  (!) 121/50 123/71 98/67  Pulse:  (!) 58 74 98  Resp:  (!) 21 (!) 21 19  Temp: 98  F (36.7 C) 98 F (36.7 C) 98.1 F (36.7 C) 98.3 F (36.8 C)  TempSrc: Oral Oral Oral Oral  SpO2:  92% 91% 95%  Weight:  92.1 kg    Height:        Intake/Output Summary (Last 24 hours) at 06/30/2020 1304 Last data filed at 06/30/2020 1204 Gross per 24 hour  Intake 720 ml  Output 1065 ml  Net -345 ml   Filed Weights   06/23/20 0100 06/28/20 2210 06/30/20 0405  Weight: 97.5 kg 92.4 kg 92.1 kg    Examination:  General exam: Appears calm and comfortable  Respiratory system: Coarse crackles  bilaterally.  Normal work of breathing.  15 L Cardiovascular system: S1 & S2 heard, RRR. No JVD, murmurs, rubs, gallops or clicks. No pedal edema. Gastrointestinal system: Abdomen is nondistended, soft and nontender. No organomegaly or masses felt. Normal bowel sounds heard. Central nervous system: Alert and oriented. No focal neurological deficits. Extremities: Symmetric 5 x 5 power. Skin: No rashes, lesions or ulcers Psychiatry: Judgement and insight appear normal. Mood & affect appropriate.     Data Reviewed: I have personally reviewed following labs and imaging studies  CBC: Recent Labs  Lab 06/26/20 0237 06/27/20 0310 06/28/20 0342 06/29/20 0438 06/30/20 0720  WBC 12.1* 10.5 12.6* 14.2* 13.9*  NEUTROABS 10.6* 9.3* 10.9* 12.3* 11.4*  HGB 13.9 13.8 13.7 14.2 14.1  HCT 42.5 42.3 41.7 42.4 43.0  MCV 88.7 88.5 88.9 88.3 89.0  PLT 178 206 252 295 161   Basic Metabolic Panel: Recent Labs  Lab 06/26/20 0237 06/27/20 0310 06/27/20 1752 06/28/20 0342 06/29/20 0438 06/30/20 0720  NA 138 140  --  140 136 139  K 3.7 4.3  --  4.4 4.6 4.7  CL 97* 99  --  99 95* 96*  CO2 31 31  --  31 29 32  GLUCOSE 53* 68* 347* 65* 247* 140*  BUN 41* 46*  --  47* 45* 36*  CREATININE 1.32* 1.51*  1.45*  --  1.38* 1.28* 1.36*  CALCIUM 7.9* 8.0*  --  8.1* 8.4* 8.9  MG 2.1 2.4  --  2.4 2.3 2.1  PHOS 3.8 4.0  --  3.2 2.9 3.1   GFR: Estimated Creatinine Clearance: 50.2 mL/min (A) (by C-G formula based on SCr of 1.36 mg/dL (H)). Liver Function Tests: Recent Labs  Lab 06/24/20 0259 06/25/20 0433 06/26/20 0237 06/27/20 0310 06/28/20 0342 06/29/20 0438 06/30/20 0720  AST 47* 33  --   --   --   --   --   ALT 30 29  --   --   --   --   --   ALKPHOS 226* 184*  --   --   --   --   --   BILITOT 1.6* 1.2  --   --   --   --   --   PROT 6.7 6.2*  --   --   --   --   --   ALBUMIN 2.9* 2.7* 2.6* 2.4* 2.4* 2.5* 2.6*   No results for input(s): LIPASE, AMYLASE in the last 168 hours. No results for  input(s): AMMONIA in the last 168 hours. Coagulation Profile: No results for input(s): INR, PROTIME in the last 168 hours. Cardiac Enzymes: No results for input(s): CKTOTAL, CKMB, CKMBINDEX, TROPONINI in the last 168 hours. BNP (last 3 results) No results for input(s): PROBNP in the last 8760 hours. HbA1C: Recent Labs    06/28/20 0342  HGBA1C 8.4*  CBG: Recent Labs  Lab 06/29/20 2106 06/30/20 0030 06/30/20 0408 06/30/20 0838 06/30/20 1157  GLUCAP 263* 242* 166* 125* 213*   Lipid Profile: No results for input(s): CHOL, HDL, LDLCALC, TRIG, CHOLHDL, LDLDIRECT in the last 72 hours. Thyroid Function Tests: No results for input(s): TSH, T4TOTAL, FREET4, T3FREE, THYROIDAB in the last 72 hours. Anemia Panel: No results for input(s): VITAMINB12, FOLATE, FERRITIN, TIBC, IRON, RETICCTPCT in the last 72 hours. Sepsis Labs: No results for input(s): PROCALCITON, LATICACIDVEN in the last 168 hours.  Recent Results (from the past 240 hour(s))  MRSA PCR Screening     Status: None   Collection Time: 06/23/20 12:53 AM   Specimen: Nasopharyngeal  Result Value Ref Range Status   MRSA by PCR NEGATIVE NEGATIVE Final    Comment:        The GeneXpert MRSA Assay (FDA approved for NASAL specimens only), is one component of a comprehensive MRSA colonization surveillance program. It is not intended to diagnose MRSA infection nor to guide or monitor treatment for MRSA infections. Performed at [facility], [address], [place], [place] [address]          Radiology Studies: No results found.      Scheduled Meds: . albuterol  2 puff Inhalation Q6H  . vitamin C  500 mg Oral Daily  . aspirin EC  81 mg Oral Daily  . atorvastatin  80 mg Oral Daily  . baricitinib  2 mg Oral Daily  . Chlorhexidine Gluconate Cloth  6 each Topical Daily  . enoxaparin (LOVENOX) injection  0.5 mg/kg Subcutaneous Q24H  . insulin aspart  0-20 Units Subcutaneous TID AC  . insulin aspart  0-5  Units Subcutaneous QHS  . insulin aspart  4 Units Subcutaneous TID WC  . insulin detemir  9 Units Subcutaneous QHS  . linagliptin  5 mg Oral Daily  . predniSONE  50 mg Oral Daily  . tamsulosin  0.4 mg Oral Daily  . zinc sulfate  220 mg Oral Daily   Continuous Infusions:   LOS: 10 days    Time spent: 15 minutes    Sidney Ace, MD [facility] Pager [phone]  If 7PM-7AM, please contact night-coverage 06/30/2020, 1:04 PM

## 2020-06-30 NOTE — TOC Progression Note (Signed)
Transition of Care (TOC) - Progression Note    Patient Details  Name: Scott Lawson. MRN: [id number] Date of Birth: 1940-08-26  Transition of Care [facility] CM/SW Contact  Izola Price, RN Phone Number: 06/30/2020, 11:50 AM  Clinical Narrative:     On 06/29/20 C+ Anticipated DC 2D. Still on HFNC but down to 12L.   06/30/20 Provider indicates will order therapy evaluations/SNF.   06/30/20 RN Flowsheets show HF [place] at 7L/[place] today, down from 12L/[place] on 06/29/20.  Simmie Davies RN CM.          Expected Discharge Plan and Services                                                 Social Determinants of Health (SDOH) Interventions    Readmission Risk Interventions No flowsheet data found.

## 2020-06-30 NOTE — Plan of Care (Signed)
  Problem: Education: Goal: Knowledge of General Education information will improve Description: Including pain rating scale, medication(s)/side effects and non-pharmacologic comfort measures Outcome: Progressing   Problem: Clinical Measurements: Goal: Will remain free from infection Outcome: Progressing   Problem: Clinical Measurements: Goal: Respiratory complications will improve Outcome: Progressing   Problem: Safety: Goal: Ability to remain free from injury will improve Outcome: Progressing   

## 2020-07-01 DIAGNOSIS — U071 COVID-19: Secondary | ICD-10-CM | POA: Diagnosis not present

## 2020-07-01 DIAGNOSIS — J1282 Pneumonia due to coronavirus disease 2019: Secondary | ICD-10-CM | POA: Diagnosis not present

## 2020-07-01 LAB — RENAL FUNCTION PANEL
Albumin: 2.6 g/dL — ABNORMAL LOW (ref 3.5–5.0)
Anion gap: 9 (ref 5–15)
BUN: 36 mg/dL — ABNORMAL HIGH (ref 8–23)
CO2: 33 mmol/L — ABNORMAL HIGH (ref 22–32)
Calcium: 8.8 mg/dL — ABNORMAL LOW (ref 8.9–10.3)
Chloride: 96 mmol/L — ABNORMAL LOW (ref 98–111)
Creatinine, Ser: 1.5 mg/dL — ABNORMAL HIGH (ref 0.61–1.24)
GFR, Estimated: 47 mL/min — ABNORMAL LOW (ref >60)
Glucose, Bld: 113 mg/dL — ABNORMAL HIGH (ref 70–99)
Phosphorus: 4 mg/dL (ref 2.5–4.6)
Potassium: 4.7 mmol/L (ref 3.5–5.1)
Sodium: 138 mmol/L (ref 135–145)

## 2020-07-01 LAB — CBC WITH DIFFERENTIAL/PLATELET
Abs Immature Granulocytes: 0.33 10*3/uL — ABNORMAL HIGH (ref 0.00–0.07)
Basophils Absolute: 0 10*3/uL (ref 0.0–0.1)
Basophils Relative: 0 %
Eosinophils Absolute: 0.2 10*3/uL (ref 0.0–0.5)
Eosinophils Relative: 1 %
HCT: 44.3 % (ref 39.0–52.0)
Hemoglobin: 14.3 g/dL (ref 13.0–17.0)
Immature Granulocytes: 2 %
Lymphocytes Relative: 7 %
Lymphs Abs: 1 10*3/uL (ref 0.7–4.0)
MCH: 28.9 pg (ref 26.0–34.0)
MCHC: 32.3 g/dL (ref 30.0–36.0)
MCV: 89.7 fL (ref 80.0–100.0)
Monocytes Absolute: 1 10*3/uL (ref 0.1–1.0)
Monocytes Relative: 7 %
Neutro Abs: 11.9 10*3/uL — ABNORMAL HIGH (ref 1.7–7.7)
Neutrophils Relative %: 83 %
Platelets: 343 10*3/uL (ref 150–400)
RBC: 4.94 MIL/uL (ref 4.22–5.81)
RDW: 13.1 % (ref 11.5–15.5)
WBC: 14.5 10*3/uL — ABNORMAL HIGH (ref 4.0–10.5)
nRBC: 0 % (ref 0.0–0.2)

## 2020-07-01 LAB — GLUCOSE, CAPILLARY
Glucose-Capillary: 104 mg/dL — ABNORMAL HIGH (ref 70–99)
Glucose-Capillary: 129 mg/dL — ABNORMAL HIGH (ref 70–99)
Glucose-Capillary: 153 mg/dL — ABNORMAL HIGH (ref 70–99)
Glucose-Capillary: 286 mg/dL — ABNORMAL HIGH (ref 70–99)
Glucose-Capillary: 321 mg/dL — ABNORMAL HIGH (ref 70–99)
Glucose-Capillary: 419 mg/dL — ABNORMAL HIGH (ref 70–99)

## 2020-07-01 LAB — MAGNESIUM: Magnesium: 2.1 mg/dL (ref 1.7–2.4)

## 2020-07-01 NOTE — Progress Notes (Signed)
PROGRESS NOTE    Scott Lawson.  [id number] DOB: 1940-07-06 DOA: 06/20/2020 PCP: Venita Lick, NP    Brief Narrative:  80 y.o.malewith medical history significant fordiabetes, BPH, HTN, HLD, obesity and GERD, who presents to the emergency room with a5-dayhistory of generalized malaise associated with lightheadedness and thenover the past 24 hours,started feeling short of breath. He denies chest pain, fever or chills, vomiting or diarrhea or abdominal pain. He is unvaccinated against COVIDand states he is not interested in getting the vaccine  Patient dependent on heated high flow between 50 and 55 L.  Unable to wean past this point.  Hypoglycemia noted in the mornings.  Adjustments made in diabetic regimen.  AM hypoglycemia has improved.  Patient is now been weaned to 12 L high flow nasal cannula and transferred to progressive cardiac floor.  1/17: Weaned to 5 L high flow.  In good spirits.  Recovering well   Assessment & Plan:   Principal Problem:   Pneumonia due to COVID-19 virus Active Problems:   Acute renal failure superimposed on stage 3a chronic kidney disease (HCC)   Hyperglycemia due to type 2 diabetes mellitus (HCC)   Generalized weakness   Acute respiratory failure due to COVID-19 (HCC)   Obesity (BMI 30-39.9)  Pneumonia due to COVID-19 virus Acute respiratory failure due to COVID-19  -Patient with shortness of breath, increased work of breathing speaking in short sentences O2 sat 48% on arrival  And then on heated high flow at 50 L to keep sats in the low 90s, and chest x-ray showing multifocal opacities -1/6--1/10 Remdesivir for 5 days as per pharmacy  -Baricitinib for 14 days, methylprednisolone, albuterol, antitussives and vitamins -Oxygen delivery system to maintain sats over 88% -Weaned to 5 L high flow as of 07/01/2020 Plan: Continue high rate oxygen, titrate as tolerated Prone as tolerated Continue baricitinib Lasix today.   Creatinine mildly worse  Acute renal failure superimposed on stage 3a chronic kidney disease -Mild acute renal failure on chronic kidney disease stage IIIa.  Cautious with diuretics.  Encourage p.o. fluid intake.  Hyperglycemia due to type 2 diabetes mellitus (HCC) A.m. hypoglycemia -Decrease basal insulin to 8 units nightly -Sliding scale insulin coverage  Generalized weaknessrelated to COVID -Nutritionist consult  Obesity (BMI 0000000) -Complicating factor to prognosis   DVT prophylaxis: Lovenox Code Status: Full Family Communication: None today Disposition Plan: Status is: Inpatient  Remains inpatient appropriate because:Inpatient level of care appropriate due to severity of illness   Dispo: The patient is from: Home              Anticipated d/c is to: SNF              Anticipated d/c date is: 2 days              Patient currently is not medically stable to d/c.   Patient improving on a daily basis.  Weaned to 5 L nasal cannula today.  Hopefully will be able to wean to 2 to 3 L or less in the next 24 to 48 hours.  Anticipated disposition to skilled nursing facility.   Consultants:   PCCM  Procedures:   None  Antimicrobials:   Plan   Subjective: Patient seen and examined.  Resting in bed.  No visible distress.  Normal work of breathing.  5 L.  No pain complaints.  Objective: Vitals:   07/01/20 0200 07/01/20 0400 07/01/20 0838 07/01/20 1241  BP:  125/64 (!) 122/59 129/60  Pulse:  69 63 76 94  Resp: (!) 26 15 20 18   Temp:  98.4 F (36.9 C) 98.6 F (37 C) 98.7 F (37.1 C)  TempSrc:   Oral Oral  SpO2: 91% 96% 97% 95%  Weight:  92.3 kg    Height:        Intake/Output Summary (Last 24 hours) at 07/01/2020 1259 Last data filed at 07/01/2020 1242 Gross per 24 hour  Intake 480 ml  Output 1300 ml  Net -820 ml   Filed Weights   06/28/20 2210 06/30/20 0405 07/01/20 0400  Weight: 92.4 kg 92.1 kg 92.3 kg    Examination:  General exam:  Appears calm and comfortable  Respiratory system: Coarse crackles bilaterally.  Normal work of breathing.  15 L Cardiovascular system: S1 & S2 heard, RRR. No JVD, murmurs, rubs, gallops or clicks. No pedal edema. Gastrointestinal system: Abdomen is nondistended, soft and nontender. No organomegaly or masses felt. Normal bowel sounds heard. Central nervous system: Alert and oriented. No focal neurological deficits. Extremities: Symmetric 5 x 5 power. Skin: No rashes, lesions or ulcers Psychiatry: Judgement and insight appear normal. Mood & affect appropriate.     Data Reviewed: I have personally reviewed following labs and imaging studies  CBC: Recent Labs  Lab 06/27/20 0310 06/28/20 0342 06/29/20 0438 06/30/20 0720 07/01/20 0650  WBC 10.5 12.6* 14.2* 13.9* 14.5*  NEUTROABS 9.3* 10.9* 12.3* 11.4* 11.9*  HGB 13.8 13.7 14.2 14.1 14.3  HCT 42.3 41.7 42.4 43.0 44.3  MCV 88.5 88.9 88.3 89.0 89.7  PLT 206 252 295 329 366   Basic Metabolic Panel: Recent Labs  Lab 06/27/20 0310 06/27/20 1752 06/28/20 0342 06/29/20 0438 06/30/20 0720 07/01/20 0650  NA 140  --  140 136 139 138  K 4.3  --  4.4 4.6 4.7 4.7  CL 99  --  99 95* 96* 96*  CO2 31  --  31 29 32 33*  GLUCOSE 68* 347* 65* 247* 140* 113*  BUN 46*  --  47* 45* 36* 36*  CREATININE 1.51*  1.45*  --  1.38* 1.28* 1.36* 1.50*  CALCIUM 8.0*  --  8.1* 8.4* 8.9 8.8*  MG 2.4  --  2.4 2.3 2.1 2.1  PHOS 4.0  --  3.2 2.9 3.1 4.0   GFR: Estimated Creatinine Clearance: 45.5 mL/min (A) (by C-G formula based on SCr of 1.5 mg/dL (H)). Liver Function Tests: Recent Labs  Lab 06/25/20 0433 06/26/20 0237 06/27/20 0310 06/28/20 0342 06/29/20 0438 06/30/20 0720 07/01/20 0650  AST 33  --   --   --   --   --   --   ALT 29  --   --   --   --   --   --   ALKPHOS 184*  --   --   --   --   --   --   BILITOT 1.2  --   --   --   --   --   --   PROT 6.2*  --   --   --   --   --   --   ALBUMIN 2.7*   < > 2.4* 2.4* 2.5* 2.6* 2.6*   < > =  values in this interval not displayed.   No results for input(s): LIPASE, AMYLASE in the last 168 hours. No results for input(s): AMMONIA in the last 168 hours. Coagulation Profile: No results for input(s): INR, PROTIME in the last 168 hours. Cardiac Enzymes: No results for  input(s): CKTOTAL, CKMB, CKMBINDEX, TROPONINI in the last 168 hours. BNP (last 3 results) No results for input(s): PROBNP in the last 8760 hours. HbA1C: No results for input(s): HGBA1C in the last 72 hours. CBG: Recent Labs  Lab 06/30/20 2118 07/01/20 0013 07/01/20 0419 07/01/20 0837 07/01/20 1240  GLUCAP 186* 153* 129* 104* 286*   Lipid Profile: No results for input(s): CHOL, HDL, LDLCALC, TRIG, CHOLHDL, LDLDIRECT in the last 72 hours. Thyroid Function Tests: No results for input(s): TSH, T4TOTAL, FREET4, T3FREE, THYROIDAB in the last 72 hours. Anemia Panel: No results for input(s): VITAMINB12, FOLATE, FERRITIN, TIBC, IRON, RETICCTPCT in the last 72 hours. Sepsis Labs: No results for input(s): PROCALCITON, LATICACIDVEN in the last 168 hours.  Recent Results (from the past 240 hour(s))  MRSA PCR Screening     Status: None   Collection Time: 06/23/20 12:53 AM   Specimen: Nasopharyngeal  Result Value Ref Range Status   MRSA by PCR NEGATIVE NEGATIVE Final    Comment:        The GeneXpert MRSA Assay (FDA approved for NASAL specimens only), is one component of a comprehensive MRSA colonization surveillance program. It is not intended to diagnose MRSA infection nor to guide or monitor treatment for MRSA infections. Performed at [facility], [address], [place], [place] [address]          Radiology Studies: No results found.      Scheduled Meds: . albuterol  2 puff Inhalation Q6H  . vitamin C  500 mg Oral Daily  . aspirin EC  81 mg Oral Daily  . atorvastatin  80 mg Oral Daily  . baricitinib  2 mg Oral Daily  . Chlorhexidine Gluconate Cloth  6 each Topical Daily  .  enoxaparin (LOVENOX) injection  0.5 mg/kg Subcutaneous Q24H  . insulin aspart  0-20 Units Subcutaneous TID AC  . insulin aspart  0-5 Units Subcutaneous QHS  . insulin aspart  4 Units Subcutaneous TID WC  . insulin detemir  9 Units Subcutaneous QHS  . linagliptin  5 mg Oral Daily  . predniSONE  50 mg Oral Daily  . tamsulosin  0.4 mg Oral Daily  . zinc sulfate  220 mg Oral Daily   Continuous Infusions:   LOS: 11 days    Time spent: 15 minutes    Sidney Ace, MD [facility] Pager [phone]  If 7PM-7AM, please contact night-coverage 07/01/2020, 12:59 PM

## 2020-07-01 NOTE — Progress Notes (Signed)
Patient sats down in the 60s at rest on 9 Liters. Increased oxygen to 15 Liters sats up to 94% at this time.

## 2020-07-01 NOTE — Plan of Care (Signed)
  Problem: Health Behavior/Discharge Planning: Goal: Ability to manage health-related needs will improve Outcome: Progressing   Problem: Clinical Measurements: Goal: Will remain free from infection Outcome: Progressing   Problem: Clinical Measurements: Goal: Respiratory complications will improve Outcome: Progressing   Problem: Nutrition: Goal: Adequate nutrition will be maintained Outcome: Progressing   Problem: Safety: Goal: Ability to remain free from injury will improve Outcome: Progressing

## 2020-07-01 NOTE — Progress Notes (Signed)
Patient is between 84-86% on 4 Liters, bumped up to 5 Liters for sats of 88-92%

## 2020-07-02 DIAGNOSIS — J1282 Pneumonia due to coronavirus disease 2019: Secondary | ICD-10-CM | POA: Diagnosis not present

## 2020-07-02 DIAGNOSIS — U071 COVID-19: Secondary | ICD-10-CM | POA: Diagnosis not present

## 2020-07-02 LAB — RENAL FUNCTION PANEL
Albumin: 2.7 g/dL — ABNORMAL LOW (ref 3.5–5.0)
Anion gap: 10 (ref 5–15)
BUN: 36 mg/dL — ABNORMAL HIGH (ref 8–23)
CO2: 33 mmol/L — ABNORMAL HIGH (ref 22–32)
Calcium: 9 mg/dL (ref 8.9–10.3)
Chloride: 95 mmol/L — ABNORMAL LOW (ref 98–111)
Creatinine, Ser: 1.41 mg/dL — ABNORMAL HIGH (ref 0.61–1.24)
GFR, Estimated: 51 mL/min — ABNORMAL LOW (ref >60)
Glucose, Bld: 99 mg/dL (ref 70–99)
Phosphorus: 4.3 mg/dL (ref 2.5–4.6)
Potassium: 4.8 mmol/L (ref 3.5–5.1)
Sodium: 138 mmol/L (ref 135–145)

## 2020-07-02 LAB — MAGNESIUM: Magnesium: 2.2 mg/dL (ref 1.7–2.4)

## 2020-07-02 LAB — CBC WITH DIFFERENTIAL/PLATELET
Abs Immature Granulocytes: 0.38 10*3/uL — ABNORMAL HIGH (ref 0.00–0.07)
Basophils Absolute: 0 10*3/uL (ref 0.0–0.1)
Basophils Relative: 0 %
Eosinophils Absolute: 0.1 10*3/uL (ref 0.0–0.5)
Eosinophils Relative: 1 %
HCT: 41.6 % (ref 39.0–52.0)
Hemoglobin: 13.6 g/dL (ref 13.0–17.0)
Immature Granulocytes: 2 %
Lymphocytes Relative: 7 %
Lymphs Abs: 1.3 10*3/uL (ref 0.7–4.0)
MCH: 29.1 pg (ref 26.0–34.0)
MCHC: 32.7 g/dL (ref 30.0–36.0)
MCV: 89.1 fL (ref 80.0–100.0)
Monocytes Absolute: 1.4 10*3/uL — ABNORMAL HIGH (ref 0.1–1.0)
Monocytes Relative: 7 %
Neutro Abs: 15.7 10*3/uL — ABNORMAL HIGH (ref 1.7–7.7)
Neutrophils Relative %: 83 %
Platelets: 370 10*3/uL (ref 150–400)
RBC: 4.67 MIL/uL (ref 4.22–5.81)
RDW: 13.2 % (ref 11.5–15.5)
WBC: 18.8 10*3/uL — ABNORMAL HIGH (ref 4.0–10.5)
nRBC: 0 % (ref 0.0–0.2)

## 2020-07-02 LAB — GLUCOSE, CAPILLARY
Glucose-Capillary: 134 mg/dL — ABNORMAL HIGH (ref 70–99)
Glucose-Capillary: 161 mg/dL — ABNORMAL HIGH (ref 70–99)
Glucose-Capillary: 247 mg/dL — ABNORMAL HIGH (ref 70–99)
Glucose-Capillary: 248 mg/dL — ABNORMAL HIGH (ref 70–99)
Glucose-Capillary: 250 mg/dL — ABNORMAL HIGH (ref 70–99)
Glucose-Capillary: 97 mg/dL (ref 70–99)

## 2020-07-02 MED ORDER — INSULIN ASPART 100 UNIT/ML ~~LOC~~ SOLN
6.0000 [IU] | Freq: Three times a day (TID) | SUBCUTANEOUS | Status: DC
  Administered 2020-07-02 – 2020-07-13 (×30): 6 [IU] via SUBCUTANEOUS
  Filled 2020-07-02 (×28): qty 1

## 2020-07-02 MED ORDER — BISACODYL 10 MG RE SUPP: 10.0000 mg | Freq: Every day | RECTAL | Status: DC | PRN

## 2020-07-02 MED ORDER — POLYETHYLENE GLYCOL 3350 17 G PO PACK
17.0000 g | PACK | Freq: Every day | ORAL | Status: DC
  Administered 2020-07-02 – 2020-07-19 (×12): 17 g via ORAL
  Filled 2020-07-02 (×15): qty 1

## 2020-07-02 MED ORDER — SENNOSIDES-DOCUSATE SODIUM 8.6-50 MG PO TABS
1.0000 | ORAL_TABLET | Freq: Two times a day (BID) | ORAL | Status: DC
  Administered 2020-07-02 – 2020-07-19 (×26): 1 via ORAL
  Filled 2020-07-02 (×35): qty 1

## 2020-07-02 MED ORDER — FUROSEMIDE 10 MG/ML IJ SOLN
40.0000 mg | Freq: Once | INTRAMUSCULAR | Status: AC
  Administered 2020-07-02: 40 mg via INTRAVENOUS
  Filled 2020-07-02: qty 4

## 2020-07-02 NOTE — Progress Notes (Signed)
PROGRESS NOTE    Scott Lawson.  WGN:[id number] DOB: May 07, 1941 DOA: 06/20/2020 PCP: Venita Lick, NP    Brief Narrative:  80 y.o.malewith medical history significant fordiabetes, BPH, HTN, HLD, obesity and GERD, who presents to the emergency room with a5-dayhistory of generalized malaise associated with lightheadedness and thenover the past 24 hours,started feeling short of breath. He denies chest pain, fever or chills, vomiting or diarrhea or abdominal pain. He is unvaccinated against COVIDand states he is not interested in getting the vaccine  Patient dependent on heated high flow between 50 and 55 L.  Unable to wean past this point.  Hypoglycemia noted in the mornings.  Adjustments made in diabetic regimen.  AM hypoglycemia has improved.  Patient is now been weaned to 12 L high flow nasal cannula and transferred to progressive cardiac floor.  1/18: Continues to progress.  Nasal cannula weaned down.  On 8 L saturating 100% at time of my evaluation this morning   Assessment & Plan:   Principal Problem:   Pneumonia due to COVID-19 virus Active Problems:   Acute renal failure superimposed on stage 3a chronic kidney disease (HCC)   Hyperglycemia due to type 2 diabetes mellitus (HCC)   Generalized weakness   Acute respiratory failure due to COVID-19 (HCC)   Obesity (BMI 30-39.9)  Pneumonia due to COVID-19 virus Acute respiratory failure due to COVID-19  -Patient with shortness of breath, increased work of breathing speaking in short sentences O2 sat 48% on arrival  And then on heated high flow at 50 L to keep sats in the low 90s, and chest x-ray showing multifocal opacities -1/6--1/10 Remdesivir for 5 days as per pharmacy  -Baricitinib for 14 days, methylprednisolone, albuterol, antitussives and vitamins -Oxygen delivery system to maintain sats over 88% -Weaned to 6-8 L high flow as of 07/02/2020 Plan: Continue high rate oxygen, titrate as tolerated Prone as  tolerated Continue baricitinib Lasix 40 mg IV x1 today Reassess daily for diuretic need and tolerance  Acute renal failure superimposed on stage 3a chronic kidney disease -Mild acute renal failure on chronic kidney disease stage IIIa.  Cautious with diuretics.  Encourage p.o. fluid intake.  Hyperglycemia due to type 2 diabetes mellitus (HCC) A.m. hypoglycemia -Decrease basal insulin to 8 units nightly -NovoLog 6 units 3 times daily with meals -Sliding scale insulin coverage  Generalized weaknessrelated to COVID -Nutritionist consult  Obesity (BMI 78-46.9) -Complicating factor to prognosis   DVT prophylaxis: Lovenox Code Status: Full Family Communication: None today.  Offered to call however patient declined.  Plan of care discussed with patient at bedside Disposition Plan: Status is: Inpatient  Remains inpatient appropriate because:Inpatient level of care appropriate due to severity of illness   Dispo: The patient is from: Home              Anticipated d/c is to: SNF              Anticipated d/c date is: 2 days              Patient currently is not medically stable to d/c.   Patient making daily improvements.  Continues to wean down his oxygen requirement.  If we are able to wean down to 4 L or less patient may be able to discharge to skilled nursing facility if he is agreeable.   Consultants:   PCCM  Procedures:   None  Antimicrobials:   Plan   Subjective: Patient seen and examined.  No complaints.  In good  mood today.  Objective: Vitals:   07/02/20 0538 07/02/20 0602 07/02/20 0843 07/02/20 1232  BP:   112/81 110/78  Pulse:   84 88  Resp:   19 20  Temp:   97.9 F (36.6 C) 97.8 F (36.6 C)  TempSrc:   Oral Oral  SpO2:  92% 94% 100%  Weight: 90.7 kg     Height:        Intake/Output Summary (Last 24 hours) at 07/02/2020 1357 Last data filed at 07/02/2020 1233 Gross per 24 hour  Intake 240 ml  Output 1440 ml  Net -1200 ml   Filed  Weights   06/30/20 0405 07/01/20 0400 07/02/20 0538  Weight: 92.1 kg 92.3 kg 90.7 kg    Examination:  General exam: Appears calm and comfortable  Respiratory system: Coarse crackles bilaterally.  Normal work of breathing.  15 L Cardiovascular system: S1 & S2 heard, RRR. No JVD, murmurs, rubs, gallops or clicks. No pedal edema. Gastrointestinal system: Abdomen is nondistended, soft and nontender. No organomegaly or masses felt. Normal bowel sounds heard. Central nervous system: Alert and oriented. No focal neurological deficits. Extremities: Symmetric 5 x 5 power. Skin: No rashes, lesions or ulcers Psychiatry: Judgement and insight appear normal. Mood & affect appropriate.     Data Reviewed: I have personally reviewed following labs and imaging studies  CBC: Recent Labs  Lab 06/28/20 0342 06/29/20 0438 06/30/20 0720 07/01/20 0650 07/02/20 0438  WBC 12.6* 14.2* 13.9* 14.5* 18.8*  NEUTROABS 10.9* 12.3* 11.4* 11.9* 15.7*  HGB 13.7 14.2 14.1 14.3 13.6  HCT 41.7 42.4 43.0 44.3 41.6  MCV 88.9 88.3 89.0 89.7 89.1  PLT 252 295 329 343 0000000   Basic Metabolic Panel: Recent Labs  Lab 06/28/20 0342 06/29/20 0438 06/30/20 0720 07/01/20 0650 07/02/20 0438  NA 140 136 139 138 138  K 4.4 4.6 4.7 4.7 4.8  CL 99 95* 96* 96* 95*  CO2 31 29 32 33* 33*  GLUCOSE 65* 247* 140* 113* 99  BUN 47* 45* 36* 36* 36*  CREATININE 1.38* 1.28* 1.36* 1.50* 1.41*  CALCIUM 8.1* 8.4* 8.9 8.8* 9.0  MG 2.4 2.3 2.1 2.1 2.2  PHOS 3.2 2.9 3.1 4.0 4.3   GFR: Estimated Creatinine Clearance: 48.1 mL/min (A) (by C-G formula based on SCr of 1.41 mg/dL (H)). Liver Function Tests: Recent Labs  Lab 06/28/20 0342 06/29/20 0438 06/30/20 0720 07/01/20 0650 07/02/20 0438  ALBUMIN 2.4* 2.5* 2.6* 2.6* 2.7*   No results for input(s): LIPASE, AMYLASE in the last 168 hours. No results for input(s): AMMONIA in the last 168 hours. Coagulation Profile: No results for input(s): INR, PROTIME in the last 168  hours. Cardiac Enzymes: No results for input(s): CKTOTAL, CKMB, CKMBINDEX, TROPONINI in the last 168 hours. BNP (last 3 results) No results for input(s): PROBNP in the last 8760 hours. HbA1C: No results for input(s): HGBA1C in the last 72 hours. CBG: Recent Labs  Lab 07/01/20 2038 07/02/20 0016 07/02/20 0427 07/02/20 0842 07/02/20 1232  GLUCAP 321* 134* 97 161* 250*   Lipid Profile: No results for input(s): CHOL, HDL, LDLCALC, TRIG, CHOLHDL, LDLDIRECT in the last 72 hours. Thyroid Function Tests: No results for input(s): TSH, T4TOTAL, FREET4, T3FREE, THYROIDAB in the last 72 hours. Anemia Panel: No results for input(s): VITAMINB12, FOLATE, FERRITIN, TIBC, IRON, RETICCTPCT in the last 72 hours. Sepsis Labs: No results for input(s): PROCALCITON, LATICACIDVEN in the last 168 hours.  Recent Results (from the past 240 hour(s))  MRSA PCR Screening  Status: None   Collection Time: 06/23/20 12:53 AM   Specimen: Nasopharyngeal  Result Value Ref Range Status   MRSA by PCR NEGATIVE NEGATIVE Final    Comment:        The GeneXpert MRSA Assay (FDA approved for NASAL specimens only), is one component of a comprehensive MRSA colonization surveillance program. It is not intended to diagnose MRSA infection nor to guide or monitor treatment for MRSA infections. Performed at [facility], [address], [place], [place] [address]          Radiology Studies: No results found.      Scheduled Meds: . albuterol  2 puff Inhalation Q6H  . vitamin C  500 mg Oral Daily  . aspirin EC  81 mg Oral Daily  . atorvastatin  80 mg Oral Daily  . baricitinib  2 mg Oral Daily  . Chlorhexidine Gluconate Cloth  6 each Topical Daily  . enoxaparin (LOVENOX) injection  0.5 mg/kg Subcutaneous Q24H  . insulin aspart  0-20 Units Subcutaneous TID AC  . insulin aspart  0-5 Units Subcutaneous QHS  . insulin aspart  6 Units Subcutaneous TID WC  . insulin detemir  9 Units Subcutaneous  QHS  . linagliptin  5 mg Oral Daily  . polyethylene glycol  17 g Oral Daily  . predniSONE  50 mg Oral Daily  . senna-docusate  1 tablet Oral BID  . tamsulosin  0.4 mg Oral Daily  . zinc sulfate  220 mg Oral Daily   Continuous Infusions:   LOS: 12 days    Time spent: 15 minutes    Sidney Ace, MD [facility] Pager [phone]  If 7PM-7AM, please contact night-coverage 07/02/2020, 1:57 PM

## 2020-07-02 NOTE — Plan of Care (Signed)

## 2020-07-02 NOTE — Progress Notes (Signed)
Inpatient Diabetes Program Recommendations  AACE/ADA: New Consensus Statement on Inpatient Glycemic Control (2015)  Target Ranges:  Prepandial:   less than 140 mg/dL      Peak postprandial:   less than 180 mg/dL (1-2 hours)      Critically ill patients:  140 - 180 mg/dL   Lab Results  Component Value Date   GLUCAP 250 (H) 07/02/2020   HGBA1C 8.4 (H) 06/28/2020    Review of Glycemic Control Results for Scott Lawson, Scott Lawson (MRN [id number]) as of 07/02/2020 12:51  Ref. Range 07/01/2020 20:38 07/02/2020 00:16 07/02/2020 04:27 07/02/2020 08:42 07/02/2020 12:32  Glucose-Capillary Latest Ref Range: 70 - 99 mg/dL 321 (H) 134 (H) 97 161 (H) 250 (H)   Diabetes history: DM 2 Outpatient Diabetes medications:  Lantus 25 units q evening, Humalog 10 units tid with meals, Metformin 1000 mg bid Current orders for Inpatient glycemic control:  Novolog resistant tid with meals and HS Novolog 4 units tid with meals Levemir 9 units q HS Tradjenta 5 mg daily Prednisone 50 mg daily  Inpatient Diabetes Program Recommendations:    Continues to have elevated post-prandial blood sugars.  Recommend increasing Novolog meal coverage to 6 units tid with meals while on prednisone.   Thanks  Adah Perl, RN, BC-ADM Inpatient Diabetes Coordinator Pager [phone] (8a-5p)

## 2020-07-03 DIAGNOSIS — U071 COVID-19: Secondary | ICD-10-CM | POA: Diagnosis not present

## 2020-07-03 DIAGNOSIS — J1282 Pneumonia due to coronavirus disease 2019: Secondary | ICD-10-CM | POA: Diagnosis not present

## 2020-07-03 DIAGNOSIS — J9600 Acute respiratory failure, unspecified whether with hypoxia or hypercapnia: Secondary | ICD-10-CM

## 2020-07-03 LAB — RENAL FUNCTION PANEL
Albumin: 2.5 g/dL — ABNORMAL LOW (ref 3.5–5.0)
Anion gap: 12 (ref 5–15)
BUN: 34 mg/dL — ABNORMAL HIGH (ref 8–23)
CO2: 31 mmol/L (ref 22–32)
Calcium: 8.7 mg/dL — ABNORMAL LOW (ref 8.9–10.3)
Chloride: 93 mmol/L — ABNORMAL LOW (ref 98–111)
Creatinine, Ser: 1.37 mg/dL — ABNORMAL HIGH (ref 0.61–1.24)
GFR, Estimated: 53 mL/min — ABNORMAL LOW (ref >60)
Glucose, Bld: 167 mg/dL — ABNORMAL HIGH (ref 70–99)
Phosphorus: 4.1 mg/dL (ref 2.5–4.6)
Potassium: 5 mmol/L (ref 3.5–5.1)
Sodium: 136 mmol/L (ref 135–145)

## 2020-07-03 LAB — CBC WITH DIFFERENTIAL/PLATELET
Abs Immature Granulocytes: 0.37 10*3/uL — ABNORMAL HIGH (ref 0.00–0.07)
Basophils Absolute: 0 10*3/uL (ref 0.0–0.1)
Basophils Relative: 0 %
Eosinophils Absolute: 0.2 10*3/uL (ref 0.0–0.5)
Eosinophils Relative: 1 %
HCT: 43 % (ref 39.0–52.0)
Hemoglobin: 14.3 g/dL (ref 13.0–17.0)
Immature Granulocytes: 2 %
Lymphocytes Relative: 7 %
Lymphs Abs: 1.2 10*3/uL (ref 0.7–4.0)
MCH: 29.5 pg (ref 26.0–34.0)
MCHC: 33.3 g/dL (ref 30.0–36.0)
MCV: 88.8 fL (ref 80.0–100.0)
Monocytes Absolute: 1.3 10*3/uL — ABNORMAL HIGH (ref 0.1–1.0)
Monocytes Relative: 8 %
Neutro Abs: 14.2 10*3/uL — ABNORMAL HIGH (ref 1.7–7.7)
Neutrophils Relative %: 82 %
Platelets: 388 10*3/uL (ref 150–400)
RBC: 4.84 MIL/uL (ref 4.22–5.81)
RDW: 13.2 % (ref 11.5–15.5)
WBC: 17.2 10*3/uL — ABNORMAL HIGH (ref 4.0–10.5)
nRBC: 0 % (ref 0.0–0.2)

## 2020-07-03 LAB — GLUCOSE, CAPILLARY
Glucose-Capillary: 159 mg/dL — ABNORMAL HIGH (ref 70–99)
Glucose-Capillary: 188 mg/dL — ABNORMAL HIGH (ref 70–99)
Glucose-Capillary: 284 mg/dL — ABNORMAL HIGH (ref 70–99)
Glucose-Capillary: 344 mg/dL — ABNORMAL HIGH (ref 70–99)

## 2020-07-03 LAB — MAGNESIUM: Magnesium: 2 mg/dL (ref 1.7–2.4)

## 2020-07-03 NOTE — Progress Notes (Addendum)
Progress Note    Scott Lawson.  DVV:[id number] DOB: [birth year]  DOA: 06/20/2020 PCP: Venita Lick, NP      Brief Narrative:    Medical records reviewed and are as summarized below:  Scott Lawson. is a 80 y.o. male       Assessment/Plan:   Principal Problem:   Pneumonia due to COVID-19 virus Active Problems:   Acute renal failure superimposed on stage 3a chronic kidney disease ([place])   Hyperglycemia due to type 2 diabetes mellitus ([place])   Generalized weakness   Acute respiratory failure due to COVID-19 ([place])   Obesity (BMI 30-39.9)    Body mass index is 24.6 kg/m.    PLAN  He completed remdesivir on 06/24/2020.  Continue prednisone and baricitinib for COVID-19 pneumonia.  Pulm was tolerated.  Continue 6 L/min oxygen via nasal cannula for hypoxia.  Taper down oxygen as able.  Continue Levemir, NovoLog and linagliptin for type II diabetes with hyperglycemia.  Monitor glucose levels closely.  Creatinine is stable.  Continue to monitor closely.  Leukocytosis is likely due to steroids.  Monitor WBC  Diet Order            Diet regular Room service appropriate? Yes; Fluid consistency: Thin  Diet effective now                    Consultants:  Intensivist  Procedures:  None    Medications:   . albuterol  2 puff Inhalation Q6H  . vitamin C  500 mg Oral Daily  . aspirin EC  81 mg Oral Daily  . atorvastatin  80 mg Oral Daily  . baricitinib  2 mg Oral Daily  . Chlorhexidine Gluconate Cloth  6 each Topical Daily  . enoxaparin (LOVENOX) injection  0.5 mg/kg Subcutaneous Q24H  . insulin aspart  0-20 Units Subcutaneous TID AC  . insulin aspart  0-5 Units Subcutaneous QHS  . insulin aspart  6 Units Subcutaneous TID WC  . insulin detemir  9 Units Subcutaneous QHS  . linagliptin  5 mg Oral Daily  . polyethylene glycol  17 g Oral Daily  . predniSONE  50 mg Oral Daily  . senna-docusate  1 tablet Oral BID  . tamsulosin  0.4 mg  Oral Daily  . zinc sulfate  220 mg Oral Daily   Continuous Infusions:   Anti-infectives (From admission, onward)   Start     Dose/Rate Route Frequency Ordered Stop   06/21/20 1000  remdesivir 100 mg in sodium chloride 0.9 % 100 mL IVPB       "Followed by" Linked Group Details   100 mg 200 mL/hr over 30 Minutes Intravenous Daily 06/20/20 2100 06/24/20 1027   06/21/20 1000  remdesivir 100 mg in sodium chloride 0.9 % 100 mL IVPB  Status:  Discontinued       "Followed by" Linked Group Details   100 mg 200 mL/hr over 30 Minutes Intravenous Daily 06/20/20 2207 06/20/20 2217   06/20/20 2200  remdesivir 200 mg in sodium chloride 0.9% 250 mL IVPB  Status:  Discontinued       "Followed by" Linked Group Details   200 mg 580 mL/hr over 30 Minutes Intravenous Once 06/20/20 2207 06/20/20 2217   06/20/20 2115  remdesivir 200 mg in sodium chloride 0.9% 250 mL IVPB       "Followed by" Linked Group Details   200 mg 580 mL/hr over 30 Minutes Intravenous Once 06/20/20 2100 06/20/20  2333             Family Communication/Anticipated D/C date and plan/Code Status   DVT prophylaxis:      Code Status: Full Code  Family Communication: None Disposition Plan:    Status is: Inpatient  Remains inpatient appropriate because:Inpatient level of care appropriate due to severity of illness and Hypoxia   Dispo: The patient is from: Home              Anticipated d/c is to: SNF              Anticipated d/c date is: 2 days              Patient currently is not medically stable to d/c.           Subjective:   Interval events noted.  He complains of shortness of breath but he feels a little better today.  Objective:    Vitals:   07/03/20 0847 07/03/20 0850 07/03/20 1219 07/03/20 1640  BP: (!) 99/52     Pulse: 71     Resp: 20     Temp: 98 F (36.7 C)  99.1 F (37.3 C) 98.4 F (36.9 C)  TempSrc: Oral  Oral Oral  SpO2: 93% 97%    Weight:      Height:       No data  found.   Intake/Output Summary (Last 24 hours) at 07/03/2020 1651 Last data filed at 07/03/2020 1635 Gross per 24 hour  Intake 360 ml  Output 1150 ml  Net -790 ml   Filed Weights   07/01/20 0400 07/02/20 0538 07/03/20 0147  Weight: 92.3 kg 90.7 kg 75.6 kg    Exam:  GEN: NAD SKIN: No rash EYES: EOMI ENT: MMM CV: RRR PULM: Bibasilar rales. No wheezing ABD: soft, ND, NT, +BS CNS: AAO x 3, non focal EXT: No edema or tenderness     Data Reviewed:   I have personally reviewed following labs and imaging studies:  Labs: Labs show the following:   Basic Metabolic Panel: Recent Labs  Lab 06/29/20 0438 06/30/20 0720 07/01/20 0650 07/02/20 0438 07/03/20 0629  NA 136 139 138 138 136  K 4.6 4.7 4.7 4.8 5.0  CL 95* 96* 96* 95* 93*  CO2 29 32 33* 33* 31  GLUCOSE 247* 140* 113* 99 167*  BUN 45* 36* 36* 36* 34*  CREATININE 1.28* 1.36* 1.50* 1.41* 1.37*  CALCIUM 8.4* 8.9 8.8* 9.0 8.7*  MG 2.3 2.1 2.1 2.2 2.0  PHOS 2.9 3.1 4.0 4.3 4.1   GFR Estimated Creatinine Clearance: 44.4 mL/min (A) (by C-G formula based on SCr of 1.37 mg/dL (H)). Liver Function Tests: Recent Labs  Lab 06/29/20 0438 06/30/20 0720 07/01/20 0650 07/02/20 0438 07/03/20 0629  ALBUMIN 2.5* 2.6* 2.6* 2.7* 2.5*   No results for input(s): LIPASE, AMYLASE in the last 168 hours. No results for input(s): AMMONIA in the last 168 hours. Coagulation profile No results for input(s): INR, PROTIME in the last 168 hours.  CBC: Recent Labs  Lab 06/29/20 0438 06/30/20 0720 07/01/20 0650 07/02/20 0438 07/03/20 0629  WBC 14.2* 13.9* 14.5* 18.8* 17.2*  NEUTROABS 12.3* 11.4* 11.9* 15.7* 14.2*  HGB 14.2 14.1 14.3 13.6 14.3  HCT 42.4 43.0 44.3 41.6 43.0  MCV 88.3 89.0 89.7 89.1 88.8  PLT 295 329 343 370 388   Cardiac Enzymes: No results for input(s): CKTOTAL, CKMB, CKMBINDEX, TROPONINI in the last 168 hours. BNP (last 3 results) No results for input(s):  PROBNP in the last 8760 hours. CBG: Recent Labs   Lab 07/02/20 1656 07/02/20 2046 07/03/20 0845 07/03/20 1217 07/03/20 1637  GLUCAP 247* 248* 159* 188* 284*   D-Dimer: No results for input(s): DDIMER in the last 72 hours. Hgb A1c: No results for input(s): HGBA1C in the last 72 hours. Lipid Profile: No results for input(s): CHOL, HDL, LDLCALC, TRIG, CHOLHDL, LDLDIRECT in the last 72 hours. Thyroid function studies: No results for input(s): TSH, T4TOTAL, T3FREE, THYROIDAB in the last 72 hours.  Invalid input(s): FREET3 Anemia work up: No results for input(s): VITAMINB12, FOLATE, FERRITIN, TIBC, IRON, RETICCTPCT in the last 72 hours. Sepsis Labs: Recent Labs  Lab 06/30/20 0720 07/01/20 0650 07/02/20 0438 07/03/20 0629  WBC 13.9* 14.5* 18.8* 17.2*    Microbiology No results found for this or any previous visit (from the past 240 hour(s)).  Procedures and diagnostic studies:  No results found.             LOS: 13 days   [place] Copywriter, advertising on [email or web address]. If 7PM-7AM, please contact night-coverage at [email or web address]     07/03/2020, 4:51 PM

## 2020-07-04 DIAGNOSIS — J1282 Pneumonia due to coronavirus disease 2019: Secondary | ICD-10-CM | POA: Diagnosis not present

## 2020-07-04 DIAGNOSIS — J9600 Acute respiratory failure, unspecified whether with hypoxia or hypercapnia: Secondary | ICD-10-CM | POA: Diagnosis not present

## 2020-07-04 DIAGNOSIS — U071 COVID-19: Secondary | ICD-10-CM | POA: Diagnosis not present

## 2020-07-04 LAB — CREATININE, SERUM
Creatinine, Ser: 1.32 mg/dL — ABNORMAL HIGH (ref 0.61–1.24)
GFR, Estimated: 55 mL/min — ABNORMAL LOW (ref >60)

## 2020-07-04 LAB — CBC WITH DIFFERENTIAL/PLATELET
Abs Immature Granulocytes: 0.22 10*3/uL — ABNORMAL HIGH (ref 0.00–0.07)
Basophils Absolute: 0 10*3/uL (ref 0.0–0.1)
Basophils Relative: 0 %
Eosinophils Absolute: 0.1 10*3/uL (ref 0.0–0.5)
Eosinophils Relative: 1 %
HCT: 41.2 % (ref 39.0–52.0)
Hemoglobin: 13.3 g/dL (ref 13.0–17.0)
Immature Granulocytes: 1 %
Lymphocytes Relative: 7 %
Lymphs Abs: 1.1 10*3/uL (ref 0.7–4.0)
MCH: 28.8 pg (ref 26.0–34.0)
MCHC: 32.3 g/dL (ref 30.0–36.0)
MCV: 89.2 fL (ref 80.0–100.0)
Monocytes Absolute: 1.3 10*3/uL — ABNORMAL HIGH (ref 0.1–1.0)
Monocytes Relative: 8 %
Neutro Abs: 12.9 10*3/uL — ABNORMAL HIGH (ref 1.7–7.7)
Neutrophils Relative %: 83 %
Platelets: 388 10*3/uL (ref 150–400)
RBC: 4.62 MIL/uL (ref 4.22–5.81)
RDW: 13.4 % (ref 11.5–15.5)
WBC: 15.7 10*3/uL — ABNORMAL HIGH (ref 4.0–10.5)
nRBC: 0 % (ref 0.0–0.2)

## 2020-07-04 LAB — RENAL FUNCTION PANEL
Albumin: 2.5 g/dL — ABNORMAL LOW (ref 3.5–5.0)
Anion gap: 10 (ref 5–15)
BUN: 37 mg/dL — ABNORMAL HIGH (ref 8–23)
CO2: 33 mmol/L — ABNORMAL HIGH (ref 22–32)
Calcium: 8.3 mg/dL — ABNORMAL LOW (ref 8.9–10.3)
Chloride: 95 mmol/L — ABNORMAL LOW (ref 98–111)
Creatinine, Ser: 1.36 mg/dL — ABNORMAL HIGH (ref 0.61–1.24)
GFR, Estimated: 53 mL/min — ABNORMAL LOW (ref >60)
Glucose, Bld: 144 mg/dL — ABNORMAL HIGH (ref 70–99)
Phosphorus: 3.8 mg/dL (ref 2.5–4.6)
Potassium: 4.9 mmol/L (ref 3.5–5.1)
Sodium: 138 mmol/L (ref 135–145)

## 2020-07-04 LAB — GLUCOSE, CAPILLARY
Glucose-Capillary: 145 mg/dL — ABNORMAL HIGH (ref 70–99)
Glucose-Capillary: 216 mg/dL — ABNORMAL HIGH (ref 70–99)
Glucose-Capillary: 200 mg/dL — ABNORMAL HIGH (ref 70–99)
Glucose-Capillary: 216 mg/dL — ABNORMAL HIGH (ref 70–99)

## 2020-07-04 LAB — MAGNESIUM: Magnesium: 2.1 mg/dL (ref 1.7–2.4)

## 2020-07-04 MED ORDER — PREDNISONE 20 MG PO TABS
30.0000 mg | ORAL_TABLET | Freq: Every day | ORAL | Status: AC
  Administered 2020-07-04: 30 mg via ORAL
  Filled 2020-07-04: qty 1

## 2020-07-04 MED ORDER — PREDNISONE 10 MG PO TABS
10.0000 mg | ORAL_TABLET | Freq: Every day | ORAL | Status: AC
  Administered 2020-07-04 – 2020-07-05 (×2): 10 mg via ORAL

## 2020-07-04 NOTE — Progress Notes (Signed)
Progress Note    Scott Lawson.  DVV:[id number] DOB: 1941-04-16  DOA: 06/20/2020 PCP: Venita Lick, NP      Brief Narrative:    Medical records reviewed and are as summarized below:  Scott Lawson. is a 80 y.o. male       Assessment/Plan:   Principal Problem:   Pneumonia due to COVID-19 virus Active Problems:   Acute renal failure superimposed on stage 3a chronic kidney disease ([place])   Hyperglycemia due to type 2 diabetes mellitus ([place])   Generalized weakness   Acute respiratory failure due to COVID-19 ([place])   Obesity (BMI 30-39.9)    Body mass index is 28.9 kg/m.    PLAN  He completed remdesivir on 06/24/2020. Continue prednisone and baricitinib for COVID-19 pneumonia. Frequent prone position as tolerated.  He is back on 6 L/min oxygen via HFNC. Taper down oxygen as able.  Continue Levemir, NovoLog and linagliptin for type II diabetes with hyperglycemia.  Monitor glucose levels closely.  Creatinine is stable.  Continue to monitor closely.  Leukocytosis is likely due to steroids.  Monitor WBC  Diet Order            Diet regular Room service appropriate? Yes; Fluid consistency: Thin  Diet effective now                    Consultants:  Intensivist  Procedures:  None    Medications:   . albuterol  2 puff Inhalation Q6H  . vitamin C  500 mg Oral Daily  . aspirin EC  81 mg Oral Daily  . atorvastatin  80 mg Oral Daily  . Chlorhexidine Gluconate Cloth  6 each Topical Daily  . enoxaparin (LOVENOX) injection  0.5 mg/kg Subcutaneous Q24H  . insulin aspart  0-20 Units Subcutaneous TID AC  . insulin aspart  0-5 Units Subcutaneous QHS  . insulin aspart  6 Units Subcutaneous TID WC  . insulin detemir  9 Units Subcutaneous QHS  . linagliptin  5 mg Oral Daily  . polyethylene glycol  17 g Oral Daily  . senna-docusate  1 tablet Oral BID  . tamsulosin  0.4 mg Oral Daily  . zinc sulfate  220 mg Oral Daily   Continuous  Infusions:   Anti-infectives (From admission, onward)   Start     Dose/Rate Route Frequency Ordered Stop   06/21/20 1000  remdesivir 100 mg in sodium chloride 0.9 % 100 mL IVPB       "Followed by" Linked Group Details   100 mg 200 mL/hr over 30 Minutes Intravenous Daily 06/20/20 2100 06/24/20 1027   06/21/20 1000  remdesivir 100 mg in sodium chloride 0.9 % 100 mL IVPB  Status:  Discontinued       "Followed by" Linked Group Details   100 mg 200 mL/hr over 30 Minutes Intravenous Daily 06/20/20 2207 06/20/20 2217   06/20/20 2200  remdesivir 200 mg in sodium chloride 0.9% 250 mL IVPB  Status:  Discontinued       "Followed by" Linked Group Details   200 mg 580 mL/hr over 30 Minutes Intravenous Once 06/20/20 2207 06/20/20 2217   06/20/20 2115  remdesivir 200 mg in sodium chloride 0.9% 250 mL IVPB       "Followed by" Linked Group Details   200 mg 580 mL/hr over 30 Minutes Intravenous Once 06/20/20 2100 06/20/20 2333             Family Communication/Anticipated D/C date  and plan/Code Status   DVT prophylaxis:      Code Status: Full Code  Family Communication: None Disposition Plan:    Status is: Inpatient  Remains inpatient appropriate because:Inpatient level of care appropriate due to severity of illness and Hypoxia   Dispo: The patient is from: Home              Anticipated d/c is to: SNF              Anticipated d/c date is: 2 days              Patient currently is not medically stable to d/c.           Subjective:   Interval events noted. It was reported that oxygen saturation dropped to 60% improved, tested by HFNC. However, it appeared reading was inaccurate. Eventually, he tolerated 8 L/min oxygen by HFNC.  Objective:    Vitals:   07/04/20 0600 07/04/20 0829 07/04/20 0830 07/04/20 1145  BP:   94/75 (!) 103/52  Pulse:   73 81  Resp:   20 (!) 22  Temp:   98 F (36.7 C) 98.7 F (37.1 C)  TempSrc:   Oral Oral  SpO2: 92% 94% 95% 98%  Weight:       Height:       No data found.   Intake/Output Summary (Last 24 hours) at 07/04/2020 1506 Last data filed at 07/04/2020 1146 Gross per 24 hour  Intake --  Output 1051 ml  Net -1051 ml   Filed Weights   07/02/20 0538 07/03/20 0147 07/04/20 0221  Weight: 90.7 kg 75.6 kg 88.8 kg    Exam:  GEN: NAD SKIN: No rash EYES: EOMI ENT: MMM CV: RRR PULM: Bibasilar rales. No wheezing ABD: soft, ND, NT, +BS CNS: AAO x 3, non focal EXT: No edema or tenderness      Data Reviewed:   I have personally reviewed following labs and imaging studies:  Labs: Labs show the following:   Basic Metabolic Panel: Recent Labs  Lab 06/30/20 0720 07/01/20 0650 07/02/20 0438 07/03/20 0629 07/04/20 0437  NA 139 138 138 136 138  K 4.7 4.7 4.8 5.0 4.9  CL 96* 96* 95* 93* 95*  CO2 32 33* 33* 31 33*  GLUCOSE 140* 113* 99 167* 144*  BUN 36* 36* 36* 34* 37*  CREATININE 1.36* 1.50* 1.41* 1.37* 1.36*  1.32*  CALCIUM 8.9 8.8* 9.0 8.7* 8.3*  MG 2.1 2.1 2.2 2.0 2.1  PHOS 3.1 4.0 4.3 4.1 3.8   GFR Estimated Creatinine Clearance: 50.8 mL/min (A) (by C-G formula based on SCr of 1.32 mg/dL (H)). Liver Function Tests: Recent Labs  Lab 06/30/20 0720 07/01/20 0650 07/02/20 0438 07/03/20 0629 07/04/20 0437  ALBUMIN 2.6* 2.6* 2.7* 2.5* 2.5*   No results for input(s): LIPASE, AMYLASE in the last 168 hours. No results for input(s): AMMONIA in the last 168 hours. Coagulation profile No results for input(s): INR, PROTIME in the last 168 hours.  CBC: Recent Labs  Lab 06/30/20 0720 07/01/20 0650 07/02/20 0438 07/03/20 0629 07/04/20 0437  WBC 13.9* 14.5* 18.8* 17.2* 15.7*  NEUTROABS 11.4* 11.9* 15.7* 14.2* 12.9*  HGB 14.1 14.3 13.6 14.3 13.3  HCT 43.0 44.3 41.6 43.0 41.2  MCV 89.0 89.7 89.1 88.8 89.2  PLT 329 343 370 388 388   Cardiac Enzymes: No results for input(s): CKTOTAL, CKMB, CKMBINDEX, TROPONINI in the last 168 hours. BNP (last 3 results) No results for input(s): PROBNP in the  last 8760 hours. CBG: Recent Labs  Lab 07/03/20 1217 07/03/20 1637 07/03/20 1954 07/04/20 0833 07/04/20 1145  GLUCAP 188* 284* 344* 145* 216*   D-Dimer: No results for input(s): DDIMER in the last 72 hours. Hgb A1c: No results for input(s): HGBA1C in the last 72 hours. Lipid Profile: No results for input(s): CHOL, HDL, LDLCALC, TRIG, CHOLHDL, LDLDIRECT in the last 72 hours. Thyroid function studies: No results for input(s): TSH, T4TOTAL, T3FREE, THYROIDAB in the last 72 hours.  Invalid input(s): FREET3 Anemia work up: No results for input(s): VITAMINB12, FOLATE, FERRITIN, TIBC, IRON, RETICCTPCT in the last 72 hours. Sepsis Labs: Recent Labs  Lab 07/01/20 0650 07/02/20 0438 07/03/20 0629 07/04/20 0437  WBC 14.5* 18.8* 17.2* 15.7*    Microbiology No results found for this or any previous visit (from the past 240 hour(s)).  Procedures and diagnostic studies:  No results found.             LOS: 14 days   [place] Copywriter, advertising on [email or web address]. If 7PM-7AM, please contact night-coverage at [email or web address]     07/04/2020, 3:06 PM

## 2020-07-04 NOTE — Progress Notes (Signed)
Patient has wound on left arm that he states is from an infiltrated IV.  Site is red/swollen and the middle is completely black.  MD aware.

## 2020-07-04 NOTE — Progress Notes (Signed)
Pt sats down to 68 % on 15 L HFNC, upon fixing O2 equipment pt came up to 99%. Pt now on 8 L HFNC sats @ 96%

## 2020-07-04 NOTE — Progress Notes (Signed)
Pt sats down to 74-76% on 8L HFNC. Pt now on 15L HFNC satting @ 86-90%.

## 2020-07-04 NOTE — Evaluation (Signed)
Physical Therapy Evaluation Patient Details Name: Scott Lawson. MRN: [id number] DOB: [birth year] Today's Date: 07/04/2020   History of Present Illness  presented to ER secondary to general malaise, lightheadedness and SOB; admitted for management of acute respiratory failure due to COVID-19, PNA  Clinical Impression  Upon evaluation, patient alert and oriented; follows commands and participates well with session.  Limited insight into deficits and functional implications noted.  Multiple cues for pursed lip breathing, rest periods and overall safety required throughout session.  Bilat UE/LE strength and ROM grossly symmetrical and WFL for basic transfers and mobility; no focal weakness appreciated.  Able to complete bed mobility with min assist; sit/stand, basic transfers and gait (5') with RW, min/mod assist.  Demonstrates choppy steps, inconsistent foot placement; increased sway in all planes, requiring physical assist at all times to maintain balance/prevent fall.  Unable to tolerate additional distance due to fatigue, desaturation with exertion. Notably SOB with very minimal exertion; desat to mid-70s with bed mobility and simple transfer, requiring frequent and extended rest periods for recovery >88%.  Did increase HFNC to 9L mid-session for improved oxygenation; left on 9L end of session. RN informed/aware. Would benefit from skilled PT to address above deficits and promote optimal return to PLOF; recommend transition to STR upon discharge from acute hospitalization.     Follow Up Recommendations SNF    Equipment Recommendations       Recommendations for Other Services       Precautions / Restrictions Precautions Precautions: Fall Restrictions Weight Bearing Restrictions: No      Mobility  Bed Mobility Overal bed mobility: Needs Assistance Bed Mobility: Supine to Sit     Supine to sit: Min guard          Transfers Overall transfer level: Needs  assistance Equipment used: Rolling walker (2 wheeled) Transfers: Sit to/from Stand Sit to Stand: Mod assist;Min assist         General transfer comment: assist for lift off and initial stabilization; broad BOS, limited balance reactions evident.  Ambulation/Gait Ambulation/Gait assistance: Mod assist;Min assist Gait Distance (Feet): 5 Feet Assistive device: Rolling walker (2 wheeled)       General Gait Details: choppy steps, inconsistent foot placement; increased sway in all planes, requiring physical assist at all times to maintain balance/prevent fall  Stairs            Wheelchair Mobility    Modified Rankin (Stroke Patients Only)       Balance Overall balance assessment: Needs assistance Sitting-balance support: No upper extremity supported;Feet supported Sitting balance-Leahy Scale: Good     Standing balance support: Bilateral upper extremity supported Standing balance-Leahy Scale: Poor                               Pertinent Vitals/Pain Pain Assessment: No/denies pain    Home Living Family/patient expects to be discharged to:: Private residence Living Arrangements: Other relatives Available Help at Discharge: Family Type of Home: House Home Access: Stairs to enter Entrance Stairs-Rails: Right Entrance Stairs-Number of Steps: 3-4 Home Layout: Two level;Able to live on main level with bedroom/bathroom Home Equipment: Kasandra Knudsen - single point      Prior Function Level of Independence: Independent with assistive device(s)         Comments: Mod indep with SPC for ADLs, household mobilization; denies fall history; no home O2.     Hand Dominance        Extremity/Trunk Assessment  Upper Extremity Assessment Upper Extremity Assessment: Generalized weakness    Lower Extremity Assessment Lower Extremity Assessment: Generalized weakness (grossly 4-/5 throughout)       Communication   Communication: No difficulties  Cognition  Arousal/Alertness: Awake/alert Behavior During Therapy: WFL for tasks assessed/performed Overall Cognitive Status: Within Functional Limits for tasks assessed                                        General Comments      Exercises Other Exercises Other Exercises: Grooming, oral care, upper and lower body hygiene, seated in recliner, min assist to complete.  Notably SOB with exertion, requiring multiple rest periods with activity for recovery. Other Exercises: Educated in activity pacing and energy conservation with activity, role of pursed lip breathing to maximize O2; patietn voiced understanding, min assist to integrate into functional tasks.   Assessment/Plan    PT Assessment Patient needs continued PT services  PT Problem List Decreased activity tolerance;Decreased balance;Decreased mobility;Decreased knowledge of use of DME;Decreased safety awareness;Decreased knowledge of precautions;Cardiopulmonary status limiting activity       PT Treatment Interventions DME instruction;Gait training;Stair training;Functional mobility training;Therapeutic activities;Therapeutic exercise;Balance training;Patient/family education    PT Goals (Current goals can be found in the Care Plan section)  Acute Rehab PT Goals Patient Stated Goal: to return home PT Goal Formulation: With patient Time For Goal Achievement: 07/18/20 Potential to Achieve Goals: Good    Frequency Min 2X/week   Barriers to discharge        Co-evaluation               AM-PAC PT "6 Clicks" Mobility  Outcome Measure Help needed turning from your back to your side while in a flat bed without using bedrails?: None Help needed moving from lying on your back to sitting on the side of a flat bed without using bedrails?: A Little Help needed moving to and from a bed to a chair (including a wheelchair)?: A Little Help needed standing up from a chair using your arms (e.g., wheelchair or bedside chair)?: A  Lot Help needed to walk in hospital room?: A Lot Help needed climbing 3-5 steps with a railing? : A Lot 6 Click Score: 16    End of Session Equipment Utilized During Treatment: Oxygen Activity Tolerance: Patient tolerated treatment well Patient left: in chair;with call bell/phone within reach;with chair alarm set Nurse Communication: Mobility status PT Visit Diagnosis: Muscle weakness (generalized) (M62.81);Difficulty in walking, not elsewhere classified (R26.2)    Time: 1443-1540 PT Time Calculation (min) (ACUTE ONLY): 33 min   Charges:   PT Evaluation $PT Eval Moderate Complexity: 1 Mod PT Treatments $Therapeutic Activity: 8-22 mins       Vincent Streater H. Owens Shark, PT, DPT, NCS 07/04/20, 5:30 PM [phone]

## 2020-07-05 DIAGNOSIS — J1282 Pneumonia due to coronavirus disease 2019: Secondary | ICD-10-CM | POA: Diagnosis not present

## 2020-07-05 DIAGNOSIS — U071 COVID-19: Secondary | ICD-10-CM | POA: Diagnosis not present

## 2020-07-05 DIAGNOSIS — J9600 Acute respiratory failure, unspecified whether with hypoxia or hypercapnia: Secondary | ICD-10-CM | POA: Diagnosis not present

## 2020-07-05 LAB — CBC WITH DIFFERENTIAL/PLATELET
Abs Immature Granulocytes: 0.23 10*3/uL — ABNORMAL HIGH (ref 0.00–0.07)
Basophils Absolute: 0 10*3/uL (ref 0.0–0.1)
Basophils Relative: 0 %
Eosinophils Absolute: 0.2 10*3/uL (ref 0.0–0.5)
Eosinophils Relative: 1 %
HCT: 42 % (ref 39.0–52.0)
Hemoglobin: 13.7 g/dL (ref 13.0–17.0)
Immature Granulocytes: 2 %
Lymphocytes Relative: 12 %
Lymphs Abs: 1.8 10*3/uL (ref 0.7–4.0)
MCH: 29.3 pg (ref 26.0–34.0)
MCHC: 32.6 g/dL (ref 30.0–36.0)
MCV: 89.7 fL (ref 80.0–100.0)
Monocytes Absolute: 1.2 10*3/uL — ABNORMAL HIGH (ref 0.1–1.0)
Monocytes Relative: 8 %
Neutro Abs: 11.6 10*3/uL — ABNORMAL HIGH (ref 1.7–7.7)
Neutrophils Relative %: 77 %
Platelets: 355 10*3/uL (ref 150–400)
RBC: 4.68 MIL/uL (ref 4.22–5.81)
RDW: 13.4 % (ref 11.5–15.5)
WBC: 15 10*3/uL — ABNORMAL HIGH (ref 4.0–10.5)
nRBC: 0 % (ref 0.0–0.2)

## 2020-07-05 LAB — RENAL FUNCTION PANEL
Albumin: 2.6 g/dL — ABNORMAL LOW (ref 3.5–5.0)
Anion gap: 11 (ref 5–15)
BUN: 34 mg/dL — ABNORMAL HIGH (ref 8–23)
CO2: 29 mmol/L (ref 22–32)
Calcium: 8.6 mg/dL — ABNORMAL LOW (ref 8.9–10.3)
Chloride: 98 mmol/L (ref 98–111)
Creatinine, Ser: 1.21 mg/dL (ref 0.61–1.24)
GFR, Estimated: >60 mL/min (ref >60)
Glucose, Bld: 59 mg/dL — ABNORMAL LOW (ref 70–99)
Phosphorus: 4.3 mg/dL (ref 2.5–4.6)
Potassium: 4.4 mmol/L (ref 3.5–5.1)
Sodium: 138 mmol/L (ref 135–145)

## 2020-07-05 LAB — MAGNESIUM: Magnesium: 2.3 mg/dL (ref 1.7–2.4)

## 2020-07-05 LAB — GLUCOSE, CAPILLARY
Glucose-Capillary: 85 mg/dL (ref 70–99)
Glucose-Capillary: 235 mg/dL — ABNORMAL HIGH (ref 70–99)
Glucose-Capillary: 196 mg/dL — ABNORMAL HIGH (ref 70–99)
Glucose-Capillary: 172 mg/dL — ABNORMAL HIGH (ref 70–99)

## 2020-07-05 MED ORDER — INSULIN DETEMIR 100 UNIT/ML ~~LOC~~ SOLN
5.0000 [IU] | Freq: Every day | SUBCUTANEOUS | Status: DC
  Administered 2020-07-05 – 2020-07-07 (×3): 5 [IU] via SUBCUTANEOUS
  Filled 2020-07-05 (×4): qty 0.05

## 2020-07-05 NOTE — Progress Notes (Signed)
Physical Therapy Treatment Patient Details Name: Scott Lawson. MRN: [id number] DOB: 1941/04/07 Today's Date: 07/05/2020    History of Present Illness presented to ER secondary to general malaise, lightheadedness and SOB; admitted for management of acute respiratory failure due to COVID-19, PNA    PT Comments    Pt seen this pm, cleared by nursing for PT. Pt received in bed, required encouragement to participate due to c/o fatigue. Pt on 5L HHFNC sating at 96% at rest. Pt agreed to OOB after reviewing B LE exercises. Pt able to transfer supine to sit with CGA, HOB raised, and use of side rail. Pt's sats dropped to 81%, O2 increased in increments finally requiring 10L O2 to return >90% after several minutes. O2 placed back on 5L O2.  Pt educated on PLB technique. Pt transferred bed to chair with CGA due to impulsivity and poor safety awareness, again O2 sats dropped to 80% requiring O2 to be increased and rest break with PLB to return to 94%.  Pt left up in recliner with call bell and chair alarm on.  Nursing notified of pt's increased O2 needs with minimal activity.  Continue PT per POC, continue to monitor O2 sats.    Follow Up Recommendations  SNF     Equipment Recommendations       Recommendations for Other Services       Precautions / Restrictions Precautions Precautions: Fall Restrictions Weight Bearing Restrictions: No    Mobility  Bed Mobility Overal bed mobility: Needs Assistance Bed Mobility: Supine to Sit     Supine to sit: Min guard     General bed mobility comments: verbal cues to decrease impulsivity  Transfers Overall transfer level: Needs assistance Equipment used: Rolling walker (2 wheeled) Transfers: Sit to/from Stand Sit to Stand: Min guard         General transfer comment: Unsafe transfer bed to chair, pt educated on importance of slowing down  Ambulation/Gait             General Gait Details: Pt declined gait training   Stairs              Wheelchair Mobility    Modified Rankin (Stroke Patients Only)       Balance Overall balance assessment: Needs assistance Sitting-balance support: No upper extremity supported;Feet supported Sitting balance-Leahy Scale: Good Sitting balance - Comments:  ()2 sats dropped from 96% to 84% after moving supine to sit.)                                    Cognition Arousal/Alertness: Awake/alert Behavior During Therapy: WFL for tasks assessed/performed Overall Cognitive Status: Within Functional Limits for tasks assessed                                        Exercises      General Comments General comments (skin integrity, edema, etc.):  (Increased time needed for rest breaks during minimal activity due to significant drop in O2 sats.)      Pertinent Vitals/Pain Pain Assessment: No/denies pain    Home Living                      Prior Function            PT Goals (current goals can now be  found in the care plan section) Acute Rehab PT Goals Patient Stated Goal: to return home    Frequency    Min 2X/week      PT Plan      Co-evaluation              AM-PAC PT "6 Clicks" Mobility   Outcome Measure  Help needed turning from your back to your side while in a flat bed without using bedrails?: None Help needed moving from lying on your back to sitting on the side of a flat bed without using bedrails?: A Little Help needed moving to and from a bed to a chair (including a wheelchair)?: A Little Help needed standing up from a chair using your arms (e.g., wheelchair or bedside chair)?: A Lot Help needed to walk in hospital room?: A Lot Help needed climbing 3-5 steps with a railing? : A Lot 6 Click Score: 16    End of Session Equipment Utilized During Treatment: Oxygen Activity Tolerance:  (Patient limited by O2 desaturation.) Patient left: in chair;with call bell/phone within reach;with chair alarm  set Nurse Communication: Mobility status (O2 sats) PT Visit Diagnosis: Muscle weakness (generalized) (M62.81);Difficulty in walking, not elsewhere classified (R26.2)     Time: 4270-6237 PT Time Calculation (min) (ACUTE ONLY): 28 min  Charges:  $Therapeutic Activity: 23-37 mins                     Mikel Cella, PTA   Josie Dixon 07/05/2020, 4:04 PM

## 2020-07-05 NOTE — Plan of Care (Signed)
Pt Axox4. Calm and cooperative and able to voice his needs. Pt remains on 8 [place]. Vitals stable, No c/o of pain. Awaiting SNF placement. Will continue to monitor.  Problem: Education: Goal: Knowledge of General Education information will improve Description: Including pain rating scale, medication(s)/side effects and non-pharmacologic comfort measures Outcome: Progressing   Problem: Health Behavior/Discharge Planning: Goal: Ability to manage health-related needs will improve Outcome: Progressing   Problem: Clinical Measurements: Goal: Ability to maintain clinical measurements within normal limits will improve Outcome: Progressing Goal: Will remain free from infection Outcome: Progressing Goal: Diagnostic test results will improve Outcome: Progressing Goal: Respiratory complications will improve Outcome: Progressing Goal: Cardiovascular complication will be avoided Outcome: Progressing   Problem: Activity: Goal: Risk for activity intolerance will decrease Outcome: Progressing   Problem: Nutrition: Goal: Adequate nutrition will be maintained Outcome: Progressing   Problem: Coping: Goal: Level of anxiety will decrease Outcome: Progressing   Problem: Elimination: Goal: Will not experience complications related to bowel motility Outcome: Progressing Goal: Will not experience complications related to urinary retention Outcome: Progressing   Problem: Pain Managment: Goal: General experience of comfort will improve Outcome: Progressing   Problem: Safety: Goal: Ability to remain free from injury will improve Outcome: Progressing

## 2020-07-05 NOTE — Progress Notes (Signed)
Progress Note    Scott Roof.  ZJI:[id number] DOB: 03/06/1941  DOA: 06/20/2020 PCP: Venita Lick, NP      Brief Narrative:    Medical records reviewed and are as summarized below:  Scott Roof. is a 80 y.o. male       Assessment/Plan:   Principal Problem:   Pneumonia due to COVID-19 virus Active Problems:   Acute renal failure superimposed on stage 3a chronic kidney disease ([place])   Hyperglycemia due to type 2 diabetes mellitus ([place])   Generalized weakness   Acute respiratory failure due to COVID-19 ([place])   Obesity (BMI 30-39.9) Hypotension   Body mass index is 29.14 kg/m.    PLAN  He completed remdesivir on 06/24/2020.  Completed baricitinib on 07/04/2020.  He completed steroids on 07/05/2020.  Frequent prone position as tolerated.  He is on 9 L/min oxygen via HFNC.  Taper down oxygen as able.  He is asymptomatic from hypotension.  Monitor closely.  He is not on any anticoagulants.  He attributed low blood pressure to "blood thinners".  Continue Levemir, NovoLog and linagliptin for type II diabetes with hyperglycemia.  Monitor glucose levels closely.  Creatinine is stable.  Continue to monitor closely.  Left forearm superficial wound: Doubt cellulitis at this time.  Continue to monitor.  Leukocytosis is likely due to steroids.  Monitor WBC  Diet Order            Diet regular Room service appropriate? Yes; Fluid consistency: Thin  Diet effective now                    Consultants:  Intensivist  Procedures:  None    Medications:    albuterol  2 puff Inhalation Q6H   vitamin C  500 mg Oral Daily   aspirin EC  81 mg Oral Daily   atorvastatin  80 mg Oral Daily   Chlorhexidine Gluconate Cloth  6 each Topical Daily   enoxaparin (LOVENOX) injection  0.5 mg/kg Subcutaneous Q24H   insulin aspart  0-20 Units Subcutaneous TID AC   insulin aspart  0-5 Units Subcutaneous QHS   insulin aspart  6 Units Subcutaneous  TID WC   insulin detemir  9 Units Subcutaneous QHS   linagliptin  5 mg Oral Daily   polyethylene glycol  17 g Oral Daily   senna-docusate  1 tablet Oral BID   tamsulosin  0.4 mg Oral Daily   zinc sulfate  220 mg Oral Daily   Continuous Infusions:   Anti-infectives (From admission, onward)   Start     Dose/Rate Route Frequency Ordered Stop   06/21/20 1000  remdesivir 100 mg in sodium chloride 0.9 % 100 mL IVPB       "Followed by" Linked Group Details   100 mg 200 mL/hr over 30 Minutes Intravenous Daily 06/20/20 2100 06/24/20 1027   06/21/20 1000  remdesivir 100 mg in sodium chloride 0.9 % 100 mL IVPB  Status:  Discontinued       "Followed by" Linked Group Details   100 mg 200 mL/hr over 30 Minutes Intravenous Daily 06/20/20 2207 06/20/20 2217   06/20/20 2200  remdesivir 200 mg in sodium chloride 0.9% 250 mL IVPB  Status:  Discontinued       "Followed by" Linked Group Details   200 mg 580 mL/hr over 30 Minutes Intravenous Once 06/20/20 2207 06/20/20 2217   06/20/20 2115  remdesivir 200 mg in sodium chloride 0.9% 250  mL IVPB       "Followed by" Linked Group Details   200 mg 580 mL/hr over 30 Minutes Intravenous Once 06/20/20 2100 06/20/20 2333             Family Communication/Anticipated D/C date and plan/Code Status   DVT prophylaxis:      Code Status: Full Code  Family Communication: None Disposition Plan:    Status is: Inpatient  Remains inpatient appropriate because:Inpatient level of care appropriate due to severity of illness and Hypoxia   Dispo: The patient is from: Home              Anticipated d/c is to: SNF              Anticipated d/c date is: 2 days              Patient currently is not medically stable to d/c.           Subjective:   Interval events noted.  He said he takes 5 mg of a blood thinner and he believes that is what is causing his low blood pressure.   Objective:    Vitals:   07/05/20 0442 07/05/20 0904 07/05/20  0905 07/05/20 1259  BP: (!) 84/56 (!) 89/63  99/64  Pulse: 75 78 75 93  Resp:  (!) 27 20 20   Temp:  98 F (36.7 C)  98.6 F (37 C)  TempSrc:  Oral  Oral  SpO2:  95% 97% 90%  Weight:      Height:       No data found.   Intake/Output Summary (Last 24 hours) at 07/05/2020 1425 Last data filed at 07/05/2020 0616 Gross per 24 hour  Intake 120 ml  Output 525 ml  Net -405 ml   Filed Weights   07/03/20 0147 07/04/20 0221 07/05/20 0253  Weight: 75.6 kg 88.8 kg 89.5 kg    Exam:   GEN: NAD SKIN: Erythematous area with mild induration and tenderness surrounding superficial wound plantar left anterior upper foreram (he attributes this to "leaking IV" that was removed from the forearm EYES: No pallor or icterus ENT: MMM CV: RRR PULM: Bibasilar rales ABD: soft, ND, NT, +BS CNS: AAO x 3, non focal EXT: No edema or tenderness          Data Reviewed:   I have personally reviewed following labs and imaging studies:  Labs: Labs show the following:   Basic Metabolic Panel: Recent Labs  Lab 07/01/20 0650 07/02/20 0438 07/03/20 0629 07/04/20 0437 07/05/20 0457  NA 138 138 136 138 138  K 4.7 4.8 5.0 4.9 4.4  CL 96* 95* 93* 95* 98  CO2 33* 33* 31 33* 29  GLUCOSE 113* 99 167* 144* 59*  BUN 36* 36* 34* 37* 34*  CREATININE 1.50* 1.41* 1.37* 1.36*   1.32* 1.21  CALCIUM 8.8* 9.0 8.7* 8.3* 8.6*  MG 2.1 2.2 2.0 2.1 2.3  PHOS 4.0 4.3 4.1 3.8 4.3   GFR Estimated Creatinine Clearance: 55.7 mL/min (by C-G formula based on SCr of 1.21 mg/dL). Liver Function Tests: Recent Labs  Lab 07/01/20 0650 07/02/20 0438 07/03/20 0629 07/04/20 0437 07/05/20 0457  ALBUMIN 2.6* 2.7* 2.5* 2.5* 2.6*   No results for input(s): LIPASE, AMYLASE in the last 168 hours. No results for input(s): AMMONIA in the last 168 hours. Coagulation profile No results for input(s): INR, PROTIME in the last 168 hours.  CBC: Recent Labs  Lab 07/01/20 0650 07/02/20 [id number] 07/03/20 [id number] 07/04/20 [id number]  07/05/20 0457  WBC 14.5* 18.8* 17.2* 15.7* 15.0*  NEUTROABS 11.9* 15.7* 14.2* 12.9* 11.6*  HGB 14.3 13.6 14.3 13.3 13.7  HCT 44.3 41.6 43.0 41.2 42.0  MCV 89.7 89.1 88.8 89.2 89.7  PLT 343 370 388 388 355   Cardiac Enzymes: No results for input(s): CKTOTAL, CKMB, CKMBINDEX, TROPONINI in the last 168 hours. BNP (last 3 results) No results for input(s): PROBNP in the last 8760 hours. CBG: Recent Labs  Lab 07/04/20 1145 07/04/20 1645 07/04/20 2016 07/05/20 0903 07/05/20 1257  GLUCAP 216* 200* 216* 85 235*   D-Dimer: No results for input(s): DDIMER in the last 72 hours. Hgb A1c: No results for input(s): HGBA1C in the last 72 hours. Lipid Profile: No results for input(s): CHOL, HDL, LDLCALC, TRIG, CHOLHDL, LDLDIRECT in the last 72 hours. Thyroid function studies: No results for input(s): TSH, T4TOTAL, T3FREE, THYROIDAB in the last 72 hours.  Invalid input(s): FREET3 Anemia work up: No results for input(s): VITAMINB12, FOLATE, FERRITIN, TIBC, IRON, RETICCTPCT in the last 72 hours. Sepsis Labs: Recent Labs  Lab 07/02/20 0438 07/03/20 0629 07/04/20 0437 07/05/20 0457  WBC 18.8* 17.2* 15.7* 15.0*    Microbiology No results found for this or any previous visit (from the past 240 hour(s)).  Procedures and diagnostic studies:  No results found.             LOS: 15 days   [place] Copywriter, advertising on [email or web address]. If 7PM-7AM, please contact night-coverage at [email or web address]     07/05/2020, 2:25 PM

## 2020-07-06 DIAGNOSIS — U071 COVID-19: Secondary | ICD-10-CM | POA: Diagnosis not present

## 2020-07-06 DIAGNOSIS — J9600 Acute respiratory failure, unspecified whether with hypoxia or hypercapnia: Secondary | ICD-10-CM | POA: Diagnosis not present

## 2020-07-06 DIAGNOSIS — J1282 Pneumonia due to coronavirus disease 2019: Secondary | ICD-10-CM | POA: Diagnosis not present

## 2020-07-06 LAB — CBC WITH DIFFERENTIAL/PLATELET
Abs Immature Granulocytes: 0.22 10*3/uL — ABNORMAL HIGH (ref 0.00–0.07)
Basophils Absolute: 0 10*3/uL (ref 0.0–0.1)
Basophils Relative: 0 %
Eosinophils Absolute: 0.3 10*3/uL (ref 0.0–0.5)
Eosinophils Relative: 2 %
HCT: 40.3 % (ref 39.0–52.0)
Hemoglobin: 13.4 g/dL (ref 13.0–17.0)
Immature Granulocytes: 2 %
Lymphocytes Relative: 8 %
Lymphs Abs: 1.2 10*3/uL (ref 0.7–4.0)
MCH: 29.8 pg (ref 26.0–34.0)
MCHC: 33.3 g/dL (ref 30.0–36.0)
MCV: 89.6 fL (ref 80.0–100.0)
Monocytes Absolute: 1.2 10*3/uL — ABNORMAL HIGH (ref 0.1–1.0)
Monocytes Relative: 9 %
Neutro Abs: 11.3 10*3/uL — ABNORMAL HIGH (ref 1.7–7.7)
Neutrophils Relative %: 79 %
Platelets: 251 10*3/uL (ref 150–400)
RBC: 4.5 MIL/uL (ref 4.22–5.81)
RDW: 13.5 % (ref 11.5–15.5)
WBC: 14.3 10*3/uL — ABNORMAL HIGH (ref 4.0–10.5)
nRBC: 0 % (ref 0.0–0.2)

## 2020-07-06 LAB — RENAL FUNCTION PANEL
Albumin: 2.3 g/dL — ABNORMAL LOW (ref 3.5–5.0)
Anion gap: 13 (ref 5–15)
BUN: 31 mg/dL — ABNORMAL HIGH (ref 8–23)
CO2: 26 mmol/L (ref 22–32)
Calcium: 8.5 mg/dL — ABNORMAL LOW (ref 8.9–10.3)
Chloride: 97 mmol/L — ABNORMAL LOW (ref 98–111)
Creatinine, Ser: 1.25 mg/dL — ABNORMAL HIGH (ref 0.61–1.24)
GFR, Estimated: 59 mL/min — ABNORMAL LOW (ref >60)
Glucose, Bld: 92 mg/dL (ref 70–99)
Phosphorus: 4.4 mg/dL (ref 2.5–4.6)
Potassium: 4.9 mmol/L (ref 3.5–5.1)
Sodium: 136 mmol/L (ref 135–145)

## 2020-07-06 LAB — MAGNESIUM: Magnesium: 2.1 mg/dL (ref 1.7–2.4)

## 2020-07-06 LAB — GLUCOSE, CAPILLARY
Glucose-Capillary: 123 mg/dL — ABNORMAL HIGH (ref 70–99)
Glucose-Capillary: 110 mg/dL — ABNORMAL HIGH (ref 70–99)
Glucose-Capillary: 247 mg/dL — ABNORMAL HIGH (ref 70–99)
Glucose-Capillary: 255 mg/dL — ABNORMAL HIGH (ref 70–99)

## 2020-07-06 MED ORDER — ALUM & MAG HYDROXIDE-SIMETH 200-200-20 MG/5ML PO SUSP
30.0000 mL | Freq: Four times a day (QID) | ORAL | Status: DC | PRN
  Administered 2020-07-07 – 2020-07-13 (×8): 30 mL via ORAL
  Filled 2020-07-06 (×8): qty 30

## 2020-07-06 NOTE — Progress Notes (Addendum)
Progress Note    Scott Lawson.  ZOX:[id number] DOB: 11-08-1940  DOA: 06/20/2020 PCP: Venita Lick, NP      Brief Narrative:    Medical records reviewed and are as summarized below:  Scott Lawson. is a 80 y.o. male       Assessment/Plan:   Principal Problem:   Pneumonia due to COVID-19 virus Active Problems:   Acute renal failure superimposed on stage 3a chronic kidney disease ([place])   Hyperglycemia due to type 2 diabetes mellitus ([place])   Generalized weakness   Acute respiratory failure due to COVID-19 ([place])   Obesity (BMI 30-39.9) Hypotension   Body mass index is 29.14 kg/m.    PLAN  He completed remdesivir on 06/24/2020.  Completed baricitinib on 07/04/2020.  He completed steroids on 07/05/2020.  Frequent prone position as tolerated.  He is down to 5 L/min oxygen via HFNC.  Continue to taper taper down oxygen as able.   Overall, BP is better although it's still on the low side.  He is asymptomatic from low BP.  Continue Levemir, NovoLog and linagliptin for type II diabetes with hyperglycemia.  Monitor glucose levels closely.  Creatinine is stable.  Continue to monitor closely.  Left forearm superficial wound: Doubt cellulitis at this time.  Continue to monitor.  Leukocytosis is likely due to steroids.  Monitor WBC  Diet Order            Diet regular Room service appropriate? Yes; Fluid consistency: Thin  Diet effective now                    Consultants:  Intensivist  Procedures:  None    Medications:   . albuterol  2 puff Inhalation Q6H  . vitamin C  500 mg Oral Daily  . aspirin EC  81 mg Oral Daily  . atorvastatin  80 mg Oral Daily  . Chlorhexidine Gluconate Cloth  6 each Topical Daily  . enoxaparin (LOVENOX) injection  0.5 mg/kg Subcutaneous Q24H  . insulin aspart  0-20 Units Subcutaneous TID AC  . insulin aspart  0-5 Units Subcutaneous QHS  . insulin aspart  6 Units Subcutaneous TID WC  . insulin detemir   5 Units Subcutaneous QHS  . linagliptin  5 mg Oral Daily  . polyethylene glycol  17 g Oral Daily  . senna-docusate  1 tablet Oral BID  . tamsulosin  0.4 mg Oral Daily  . zinc sulfate  220 mg Oral Daily   Continuous Infusions:   Anti-infectives (From admission, onward)   Start     Dose/Rate Route Frequency Ordered Stop   06/21/20 1000  remdesivir 100 mg in sodium chloride 0.9 % 100 mL IVPB       "Followed by" Linked Group Details   100 mg 200 mL/hr over 30 Minutes Intravenous Daily 06/20/20 2100 06/24/20 1027   06/21/20 1000  remdesivir 100 mg in sodium chloride 0.9 % 100 mL IVPB  Status:  Discontinued       "Followed by" Linked Group Details   100 mg 200 mL/hr over 30 Minutes Intravenous Daily 06/20/20 2207 06/20/20 2217   06/20/20 2200  remdesivir 200 mg in sodium chloride 0.9% 250 mL IVPB  Status:  Discontinued       "Followed by" Linked Group Details   200 mg 580 mL/hr over 30 Minutes Intravenous Once 06/20/20 2207 06/20/20 2217   06/20/20 2115  remdesivir 200 mg in sodium chloride 0.9% 250 mL  IVPB       "Followed by" Linked Group Details   200 mg 580 mL/hr over 30 Minutes Intravenous Once 06/20/20 2100 06/20/20 2333             Family Communication/Anticipated D/C date and plan/Code Status   DVT prophylaxis:      Code Status: Full Code  Family Communication: None Disposition Plan:    Status is: Inpatient  Remains inpatient appropriate because:Inpatient level of care appropriate due to severity of illness and Hypoxia   Dispo: The patient is from: Home              Anticipated d/c is to: SNF              Anticipated d/c date is: 2 days              Patient currently is not medically stable to d/c.           Subjective:   Interval events noted.  He has no complaints.   Objective:    Vitals:   07/06/20 0500 07/06/20 0600 07/06/20 0700 07/06/20 0849  BP:    (!) 82/58  Pulse: 79 75 70 88  Resp: (!) 22 19 19 19   Temp:    98 F (36.7 C)   TempSrc:    Oral  SpO2: 100% 100% 100% 90%  Weight:      Height:       No data found.   Intake/Output Summary (Last 24 hours) at 07/06/2020 1148 Last data filed at 07/06/2020 0100 Gross per 24 hour  Intake --  Output 300 ml  Net -300 ml   Filed Weights   07/03/20 0147 07/04/20 0221 07/05/20 0253  Weight: 75.6 kg 88.8 kg 89.5 kg    Exam:  GEN: NAD SKIN: Erythematous area with mild induration and tenderness surrounding superficial wound left anterior upper foreram  EYES: EOMI ENT: MMM CV: RRR PULM: Bibasilar rales ABD: soft, ND, NT, +BS CNS: AAO x 3, non focal EXT: No edema or tenderness              Data Reviewed:   I have personally reviewed following labs and imaging studies:  Labs: Labs show the following:   Basic Metabolic Panel: Recent Labs  Lab 07/02/20 0438 07/03/20 0629 07/04/20 0437 07/05/20 0457 07/06/20 0436  NA 138 136 138 138 136  K 4.8 5.0 4.9 4.4 4.9  CL 95* 93* 95* 98 97*  CO2 33* 31 33* 29 26  GLUCOSE 99 167* 144* 59* 92  BUN 36* 34* 37* 34* 31*  CREATININE 1.41* 1.37* 1.36*  1.32* 1.21 1.25*  CALCIUM 9.0 8.7* 8.3* 8.6* 8.5*  MG 2.2 2.0 2.1 2.3 2.1  PHOS 4.3 4.1 3.8 4.3 4.4   GFR Estimated Creatinine Clearance: 53.9 mL/min (A) (by C-G formula based on SCr of 1.25 mg/dL (H)). Liver Function Tests: Recent Labs  Lab 07/02/20 0438 07/03/20 0629 07/04/20 0437 07/05/20 0457 07/06/20 0436  ALBUMIN 2.7* 2.5* 2.5* 2.6* 2.3*   No results for input(s): LIPASE, AMYLASE in the last 168 hours. No results for input(s): AMMONIA in the last 168 hours. Coagulation profile No results for input(s): INR, PROTIME in the last 168 hours.  CBC: Recent Labs  Lab 07/02/20 0438 07/03/20 0629 07/04/20 0437 07/05/20 0457 07/06/20 0436  WBC 18.8* 17.2* 15.7* 15.0* 14.3*  NEUTROABS 15.7* 14.2* 12.9* 11.6* 11.3*  HGB 13.6 14.3 13.3 13.7 13.4  HCT 41.6 43.0 41.2 42.0 40.3  MCV 89.1 88.8 89.2  89.7 89.6  PLT 370 388 388 355 251    Cardiac Enzymes: No results for input(s): CKTOTAL, CKMB, CKMBINDEX, TROPONINI in the last 168 hours. BNP (last 3 results) No results for input(s): PROBNP in the last 8760 hours. CBG: Recent Labs  Lab 07/05/20 0903 07/05/20 1257 07/05/20 1738 07/05/20 2040 07/06/20 0847  GLUCAP 85 235* 196* 172* 123*   D-Dimer: No results for input(s): DDIMER in the last 72 hours. Hgb A1c: No results for input(s): HGBA1C in the last 72 hours. Lipid Profile: No results for input(s): CHOL, HDL, LDLCALC, TRIG, CHOLHDL, LDLDIRECT in the last 72 hours. Thyroid function studies: No results for input(s): TSH, T4TOTAL, T3FREE, THYROIDAB in the last 72 hours.  Invalid input(s): FREET3 Anemia work up: No results for input(s): VITAMINB12, FOLATE, FERRITIN, TIBC, IRON, RETICCTPCT in the last 72 hours. Sepsis Labs: Recent Labs  Lab 07/03/20 0629 07/04/20 0437 07/05/20 0457 07/06/20 0436  WBC 17.2* 15.7* 15.0* 14.3*    Microbiology No results found for this or any previous visit (from the past 240 hour(s)).  Procedures and diagnostic studies:  No results found.             LOS: 16 days   [place] Copywriter, advertising on [email or web address]. If 7PM-7AM, please contact night-coverage at [email or web address]     07/06/2020, 11:48 AM

## 2020-07-06 NOTE — Progress Notes (Signed)
Patient c/o of abdominal pain, reports "my stomach ulcer is acting up". Patient requesting maalox. [place] NP notified. Awaiting orders.

## 2020-07-07 DIAGNOSIS — U071 COVID-19: Secondary | ICD-10-CM | POA: Diagnosis not present

## 2020-07-07 DIAGNOSIS — J9600 Acute respiratory failure, unspecified whether with hypoxia or hypercapnia: Secondary | ICD-10-CM | POA: Diagnosis not present

## 2020-07-07 DIAGNOSIS — J1282 Pneumonia due to coronavirus disease 2019: Secondary | ICD-10-CM | POA: Diagnosis not present

## 2020-07-07 LAB — CBC WITH DIFFERENTIAL/PLATELET
Abs Immature Granulocytes: 0.17 10*3/uL — ABNORMAL HIGH (ref 0.00–0.07)
Basophils Absolute: 0 10*3/uL (ref 0.0–0.1)
Basophils Relative: 0 %
Eosinophils Absolute: 0.3 10*3/uL (ref 0.0–0.5)
Eosinophils Relative: 2 %
HCT: 39.1 % (ref 39.0–52.0)
Hemoglobin: 12.5 g/dL — ABNORMAL LOW (ref 13.0–17.0)
Immature Granulocytes: 1 %
Lymphocytes Relative: 7 %
Lymphs Abs: 0.9 10*3/uL (ref 0.7–4.0)
MCH: 29 pg (ref 26.0–34.0)
MCHC: 32 g/dL (ref 30.0–36.0)
MCV: 90.7 fL (ref 80.0–100.0)
Monocytes Absolute: 1.3 10*3/uL — ABNORMAL HIGH (ref 0.1–1.0)
Monocytes Relative: 9 %
Neutro Abs: 11 10*3/uL — ABNORMAL HIGH (ref 1.7–7.7)
Neutrophils Relative %: 81 %
Platelets: 264 10*3/uL (ref 150–400)
RBC: 4.31 MIL/uL (ref 4.22–5.81)
RDW: 13.5 % (ref 11.5–15.5)
WBC: 13.7 10*3/uL — ABNORMAL HIGH (ref 4.0–10.5)
nRBC: 0 % (ref 0.0–0.2)

## 2020-07-07 LAB — GLUCOSE, CAPILLARY
Glucose-Capillary: 204 mg/dL — ABNORMAL HIGH (ref 70–99)
Glucose-Capillary: 338 mg/dL — ABNORMAL HIGH (ref 70–99)
Glucose-Capillary: 163 mg/dL — ABNORMAL HIGH (ref 70–99)
Glucose-Capillary: 125 mg/dL — ABNORMAL HIGH (ref 70–99)

## 2020-07-07 LAB — RENAL FUNCTION PANEL
Albumin: 2.5 g/dL — ABNORMAL LOW (ref 3.5–5.0)
Anion gap: 9 (ref 5–15)
BUN: 32 mg/dL — ABNORMAL HIGH (ref 8–23)
CO2: 30 mmol/L (ref 22–32)
Calcium: 8.2 mg/dL — ABNORMAL LOW (ref 8.9–10.3)
Chloride: 96 mmol/L — ABNORMAL LOW (ref 98–111)
Creatinine, Ser: 1.34 mg/dL — ABNORMAL HIGH (ref 0.61–1.24)
GFR, Estimated: 54 mL/min — ABNORMAL LOW (ref >60)
Glucose, Bld: 201 mg/dL — ABNORMAL HIGH (ref 70–99)
Phosphorus: 3.3 mg/dL (ref 2.5–4.6)
Potassium: 4.8 mmol/L (ref 3.5–5.1)
Sodium: 135 mmol/L (ref 135–145)

## 2020-07-07 LAB — MAGNESIUM: Magnesium: 2 mg/dL (ref 1.7–2.4)

## 2020-07-07 MED ORDER — ENOXAPARIN SODIUM 40 MG/0.4ML ~~LOC~~ SOLN
40.0000 mg | SUBCUTANEOUS | Status: DC
  Administered 2020-07-07 – 2020-07-10 (×4): 40 mg via SUBCUTANEOUS
  Filled 2020-07-07 (×4): qty 0.4

## 2020-07-07 NOTE — Plan of Care (Signed)
  Problem: Clinical Measurements: Goal: Respiratory complications will improve Outcome: Progressing Note: Patient requiring HFNC. Patient has remained stable throughout shift   Problem: Elimination: Goal: Will not experience complications related to bowel motility Outcome: Progressing Goal: Will not experience complications related to urinary retention Outcome: Progressing   Problem: Skin Integrity: Goal: Risk for impaired skin integrity will decrease Outcome: Progressing

## 2020-07-07 NOTE — Progress Notes (Signed)
Unable to obtain IV access at this time. Writer placed order for IV placement and IV nurse stated that patient didn't need access at this time there's no IV meds ordered.

## 2020-07-07 NOTE — Plan of Care (Signed)
  Problem: Elimination: Goal: Will not experience complications related to bowel motility Outcome: Progressing Goal: Will not experience complications related to urinary retention Outcome: Progressing   Problem: Skin Integrity: Goal: Risk for impaired skin integrity will decrease Outcome: Progressing   

## 2020-07-07 NOTE — Progress Notes (Signed)
Progress Note    Scott Lawson.  TO:[id number] DOB: 03-07-1941  DOA: 06/20/2020 PCP: Venita Lick, NP      Brief Narrative:    Medical records reviewed and are as summarized below:  Scott Lawson. is a 80 y.o. male       Assessment/Plan:   Principal Problem:   Pneumonia due to COVID-19 virus Active Problems:   Acute renal failure superimposed on stage 3a chronic kidney disease ([place])   Hyperglycemia due to type 2 diabetes mellitus ([place])   Generalized weakness   Acute respiratory failure due to COVID-19 ([place])   Obesity (BMI 30-39.9) Hypotension   Body mass index is 29.3 kg/m.    PLAN  He completed remdesivir on 06/24/2020.  Completed baricitinib on 07/04/2020.  He completed steroids on 07/05/2020.  Frequent prone position as tolerated.  He is up to 7 L/min oxygen via HFNC. Try to wean down oxygen again today.  Continue Levemir, NovoLog and linagliptin for diabetes mellitus.  Creatinine is stable.  Continue to monitor closely.  Left forearm superficial wound: Doubt cellulitis at this time.  Continue to monitor.  Leukocytosis is likely due to steroids.  Monitor WBC  Will discuss with social worker tomorrow to see if patient can go to SNF with 5 L of oxygen.  Diet Order            Diet regular Room service appropriate? Yes; Fluid consistency: Thin  Diet effective now                    Consultants:  Intensivist  Procedures:  None    Medications:   . albuterol  2 puff Inhalation Q6H  . vitamin C  500 mg Oral Daily  . aspirin EC  81 mg Oral Daily  . atorvastatin  80 mg Oral Daily  . Chlorhexidine Gluconate Cloth  6 each Topical Daily  . enoxaparin (LOVENOX) injection  40 mg Subcutaneous Q24H  . insulin aspart  0-20 Units Subcutaneous TID AC  . insulin aspart  0-5 Units Subcutaneous QHS  . insulin aspart  6 Units Subcutaneous TID WC  . insulin detemir  5 Units Subcutaneous QHS  . linagliptin  5 mg Oral Daily  .  polyethylene glycol  17 g Oral Daily  . senna-docusate  1 tablet Oral BID  . tamsulosin  0.4 mg Oral Daily  . zinc sulfate  220 mg Oral Daily   Continuous Infusions:   Anti-infectives (From admission, onward)   Start     Dose/Rate Route Frequency Ordered Stop   06/21/20 1000  remdesivir 100 mg in sodium chloride 0.9 % 100 mL IVPB       "Followed by" Linked Group Details   100 mg 200 mL/hr over 30 Minutes Intravenous Daily 06/20/20 2100 06/24/20 1027   06/21/20 1000  remdesivir 100 mg in sodium chloride 0.9 % 100 mL IVPB  Status:  Discontinued       "Followed by" Linked Group Details   100 mg 200 mL/hr over 30 Minutes Intravenous Daily 06/20/20 2207 06/20/20 2217   06/20/20 2200  remdesivir 200 mg in sodium chloride 0.9% 250 mL IVPB  Status:  Discontinued       "Followed by" Linked Group Details   200 mg 580 mL/hr over 30 Minutes Intravenous Once 06/20/20 2207 06/20/20 2217   06/20/20 2115  remdesivir 200 mg in sodium chloride 0.9% 250 mL IVPB       "Followed by" Linked  Group Details   200 mg 580 mL/hr over 30 Minutes Intravenous Once 06/20/20 2100 06/20/20 2333             Family Communication/Anticipated D/C date and plan/Code Status   DVT prophylaxis:      Code Status: Full Code  Family Communication: None Disposition Plan:    Status is: Inpatient  Remains inpatient appropriate because:Inpatient level of care appropriate due to severity of illness and Hypoxia   Dispo: The patient is from: Home              Anticipated d/c is to: SNF              Anticipated d/c date is: 2 days              Patient currently is not medically stable to d/c.           Subjective:   No acute events overnight. He is short of breath with exertion but he is okay at rest.   Objective:    Vitals:   07/07/20 0458 07/07/20 0842 07/07/20 1133 07/07/20 1500  BP: 118/64 124/60 110/60 100/60  Pulse: 79 82 88 88  Resp: 18 17 20 20   Temp: 98.6 F (37 C) 98.5 F (36.9  C) 98.2 F (36.8 C) 98.7 F (37.1 C)  TempSrc:  Oral    SpO2: 94% 91% 100% 99%  Weight:      Height:       No data found.   Intake/Output Summary (Last 24 hours) at 07/07/2020 1606 Last data filed at 07/07/2020 0454 Gross per 24 hour  Intake --  Output 1100 ml  Net -1100 ml   Filed Weights   07/04/20 0221 07/05/20 0253 07/07/20 0300  Weight: 88.8 kg 89.5 kg 90 kg    Exam:   GEN: NAD SKIN: Superficial wound on the left anterior upper forearm EYES: No pallor or icterus ENT: MMM CV: RRR PULM: CTA B ABD: soft, ND, NT, +BS CNS: AAO x 3, non focal EXT: No edema or tenderness                 Data Reviewed:   I have personally reviewed following labs and imaging studies:  Labs: Labs show the following:   Basic Metabolic Panel: Recent Labs  Lab 07/03/20 0629 07/04/20 0437 07/05/20 0457 07/06/20 0436 07/07/20 0400  NA 136 138 138 136 135  K 5.0 4.9 4.4 4.9 4.8  CL 93* 95* 98 97* 96*  CO2 31 33* 29 26 30   GLUCOSE 167* 144* 59* 92 201*  BUN 34* 37* 34* 31* 32*  CREATININE 1.37* 1.36*  1.32* 1.21 1.25* 1.34*  CALCIUM 8.7* 8.3* 8.6* 8.5* 8.2*  MG 2.0 2.1 2.3 2.1 2.0  PHOS 4.1 3.8 4.3 4.4 3.3   GFR Estimated Creatinine Clearance: 50.4 mL/min (A) (by C-G formula based on SCr of 1.34 mg/dL (H)). Liver Function Tests: Recent Labs  Lab 07/03/20 0629 07/04/20 0437 07/05/20 0457 07/06/20 0436 07/07/20 0400  ALBUMIN 2.5* 2.5* 2.6* 2.3* 2.5*   No results for input(s): LIPASE, AMYLASE in the last 168 hours. No results for input(s): AMMONIA in the last 168 hours. Coagulation profile No results for input(s): INR, PROTIME in the last 168 hours.  CBC: Recent Labs  Lab 07/03/20 0629 07/04/20 0437 07/05/20 0457 07/06/20 0436 07/07/20 0400  WBC 17.2* 15.7* 15.0* 14.3* 13.7*  NEUTROABS 14.2* 12.9* 11.6* 11.3* 11.0*  HGB 14.3 13.3 13.7 13.4 12.5*  HCT 43.0 41.2 42.0 40.3  39.1  MCV 88.8 89.2 89.7 89.6 90.7  PLT 388 388 355 251 264   Cardiac  Enzymes: No results for input(s): CKTOTAL, CKMB, CKMBINDEX, TROPONINI in the last 168 hours. BNP (last 3 results) No results for input(s): PROBNP in the last 8760 hours. CBG: Recent Labs  Lab 07/06/20 1756 07/06/20 2110 07/07/20 0850 07/07/20 1122 07/07/20 1548  GLUCAP 247* 255* 204* 338* 163*   D-Dimer: No results for input(s): DDIMER in the last 72 hours. Hgb A1c: No results for input(s): HGBA1C in the last 72 hours. Lipid Profile: No results for input(s): CHOL, HDL, LDLCALC, TRIG, CHOLHDL, LDLDIRECT in the last 72 hours. Thyroid function studies: No results for input(s): TSH, T4TOTAL, T3FREE, THYROIDAB in the last 72 hours.  Invalid input(s): FREET3 Anemia work up: No results for input(s): VITAMINB12, FOLATE, FERRITIN, TIBC, IRON, RETICCTPCT in the last 72 hours. Sepsis Labs: Recent Labs  Lab 07/04/20 0437 07/05/20 0457 07/06/20 0436 07/07/20 0400  WBC 15.7* 15.0* 14.3* 13.7*    Microbiology No results found for this or any previous visit (from the past 240 hour(s)).  Procedures and diagnostic studies:  No results found.             LOS: 17 days   [place] Copywriter, advertising on [email or web address]. If 7PM-7AM, please contact night-coverage at [email or web address]     07/07/2020, 4:06 PM

## 2020-07-08 DIAGNOSIS — J1282 Pneumonia due to coronavirus disease 2019: Secondary | ICD-10-CM | POA: Diagnosis not present

## 2020-07-08 DIAGNOSIS — U071 COVID-19: Secondary | ICD-10-CM | POA: Diagnosis not present

## 2020-07-08 LAB — RENAL FUNCTION PANEL
Albumin: 2.5 g/dL — ABNORMAL LOW (ref 3.5–5.0)
Anion gap: 11 (ref 5–15)
BUN: 29 mg/dL — ABNORMAL HIGH (ref 8–23)
CO2: 28 mmol/L (ref 22–32)
Calcium: 8.2 mg/dL — ABNORMAL LOW (ref 8.9–10.3)
Chloride: 94 mmol/L — ABNORMAL LOW (ref 98–111)
Creatinine, Ser: 1.36 mg/dL — ABNORMAL HIGH (ref 0.61–1.24)
GFR, Estimated: 53 mL/min — ABNORMAL LOW
Glucose, Bld: 269 mg/dL — ABNORMAL HIGH (ref 70–99)
Phosphorus: 2.5 mg/dL (ref 2.5–4.6)
Potassium: 5 mmol/L (ref 3.5–5.1)
Sodium: 133 mmol/L — ABNORMAL LOW (ref 135–145)

## 2020-07-08 LAB — CBC WITH DIFFERENTIAL/PLATELET
Abs Immature Granulocytes: 0.14 10*3/uL — ABNORMAL HIGH (ref 0.00–0.07)
Basophils Absolute: 0 10*3/uL (ref 0.0–0.1)
Basophils Relative: 0 %
Eosinophils Absolute: 0.3 10*3/uL (ref 0.0–0.5)
Eosinophils Relative: 2 %
HCT: 38 % — ABNORMAL LOW (ref 39.0–52.0)
Hemoglobin: 12.6 g/dL — ABNORMAL LOW (ref 13.0–17.0)
Immature Granulocytes: 1 %
Lymphocytes Relative: 6 %
Lymphs Abs: 0.7 10*3/uL (ref 0.7–4.0)
MCH: 29.8 pg (ref 26.0–34.0)
MCHC: 33.2 g/dL (ref 30.0–36.0)
MCV: 89.8 fL (ref 80.0–100.0)
Monocytes Absolute: 1.1 10*3/uL — ABNORMAL HIGH (ref 0.1–1.0)
Monocytes Relative: 8 %
Neutro Abs: 10.9 10*3/uL — ABNORMAL HIGH (ref 1.7–7.7)
Neutrophils Relative %: 83 %
Platelets: 255 10*3/uL (ref 150–400)
RBC: 4.23 MIL/uL (ref 4.22–5.81)
RDW: 13.7 % (ref 11.5–15.5)
WBC: 13.2 10*3/uL — ABNORMAL HIGH (ref 4.0–10.5)
nRBC: 0 % (ref 0.0–0.2)

## 2020-07-08 LAB — GLUCOSE, CAPILLARY
Glucose-Capillary: 217 mg/dL — ABNORMAL HIGH (ref 70–99)
Glucose-Capillary: 227 mg/dL — ABNORMAL HIGH (ref 70–99)
Glucose-Capillary: 299 mg/dL — ABNORMAL HIGH (ref 70–99)
Glucose-Capillary: 167 mg/dL — ABNORMAL HIGH (ref 70–99)

## 2020-07-08 LAB — MAGNESIUM: Magnesium: 2.2 mg/dL (ref 1.7–2.4)

## 2020-07-08 MED ORDER — INSULIN DETEMIR 100 UNIT/ML ~~LOC~~ SOLN
6.0000 [IU] | Freq: Two times a day (BID) | SUBCUTANEOUS | Status: DC
  Administered 2020-07-08 – 2020-07-11 (×5): 6 [IU] via SUBCUTANEOUS
  Filled 2020-07-08 (×7): qty 0.06

## 2020-07-08 NOTE — TOC Progression Note (Signed)
Transition of Care (TOC) - Progression Note    Patient Details  Name: Scott Lawson. MRN: [id number] Date of Birth: 1941/06/15  Transition of Care [facility]) CM/SW Contact  Eileen Stanford, LCSW Phone Number: 07/08/2020, 3:16 PM  Clinical Narrative: Pt assessed and faxed out. Close contact with family as well. Family is requesting placement in [place] area. CSW explained the difficulties given: [facility] is not in network with [employer], [facility] will not have a bed until the end of the week, [facility] is putting a hold on admissions due to staffing, [facility] is holding on admissions until tomorrow due to staffing and [facility] does not take Covid Pt's. Pt's family understanding but remain hopeful. CSW is waiting on a response from [facility] they are reviewing referral.     Expected Discharge Plan: [place] Barriers to Discharge: Continued Medical Work up  Expected Discharge Plan and Services Expected Discharge Plan: [place] In-house Referral: Clinical Social Work   Post Acute Care Choice: [place] Living arrangements for the past 2 months: Single Family Home                                       Social Determinants of Health (SDOH) Interventions    Readmission Risk Interventions No flowsheet data found.

## 2020-07-08 NOTE — TOC Initial Note (Signed)
Transition of Care (TOC) - Initial/Assessment Note    Patient Details  Name: Scott Lawson. MRN: [id number] Date of Birth: [birth year]  Transition [facility]) CM/SW Contact:    Eileen Stanford, LCSW Phone Number: 07/08/2020, 1:47 PM  Clinical Narrative:    Pt lives home alone. Pt tested positive for Covid on the 6th. Pt is agreeable to SNF. Family is requesting placement in [place] but not [facility]. Family wants me to [facility] into [facility]. However, [facility] not taking admissions today hopeful to take tomorrow.             Expected Discharge Plan: Skilled Nursing Facility Barriers to Discharge: Continued Medical Work up   Patient Goals and CMS Choice Patient states their goals for this hospitalization and ongoing recovery are:: to get better   Choice offered to / list presented to : [place]  Expected Discharge Plan and Services Expected Discharge Plan: [place] In-house Referral: Clinical Social Work   Post Acute Care Choice: [place] Living arrangements for the past 2 months: [place]                                      Prior Living Arrangements/Services Living arrangements for the past 2 months: Single Family Home Lives with:: Self Patient language and need for interpreter reviewed:: Yes Do you feel safe going back to the place where you live?: Yes      Need for Family Participation in Patient Care: Yes (Comment) Care giver support system in place?: Yes (comment)   Criminal Activity/Legal Involvement Pertinent to Current Situation/Hospitalization: No - Comment as needed  Activities of Daily Living Home Assistive Devices/Equipment: None ADL Screening (condition at time of admission) Patient's cognitive ability adequate to safely complete daily activities?: Yes Is the patient deaf or have difficulty hearing?: No Does the patient have difficulty seeing, even when wearing glasses/contacts?:  No Does the patient have difficulty concentrating, remembering, or making decisions?: No Patient able to express need for assistance with ADLs?: Yes Does the patient have difficulty dressing or bathing?: No Independently performs ADLs?: Yes (appropriate for developmental age) Does the patient have difficulty walking or climbing stairs?: No Weakness of Legs: None Weakness of Arms/Hands: None  Permission Sought/Granted Permission sought to share information with : Family Supports    Share Information with NAME: [name]     Permission granted to share info w Relationship: daughter     Emotional Assessment Appearance:: Appears stated age Attitude/Demeanor/Rapport: Engaged Affect (typically observed): Accepting,Appropriate Orientation: : Oriented to Self,Oriented to Place,Oriented to  Time,Oriented to Situation Alcohol / Substance Use: Not Applicable Psych Involvement: No (comment)  Admission diagnosis:  Hypoxia [R09.02] Acute respiratory failure with hypoxia ([place]) [J96.01] AKI (acute kidney injury) ([place]) [N17.9] Elevated d-dimer [R79.89] Acute respiratory failure due to COVID-19 ([place]) [U07.1, J96.00] COVID-19 [U07.1] Patient Active Problem List   Diagnosis Date Noted  . Hyperglycemia due to type 2 diabetes mellitus ([place]) 06/20/2020  . Generalized weakness 06/20/2020  . Pneumonia due to COVID-19 virus 06/20/2020  . Acute respiratory failure due to COVID-19 ([place]) 06/20/2020  . Obesity (BMI 30-39.9) 06/20/2020  . Morbid obesity ([place]) 03/22/2020  . Atherosclerosis of aorta ([place]) 10/12/2019  . Long-term insulin use ([place]) 10/12/2019  . Vitamin B12 deficiency 06/28/2018  . Prostate cancer ([place]) 06/27/2018  . Psoriasis 08/16/2017  . Advanced care planning/counseling discussion 12/01/2016  . Acute  renal failure superimposed on stage 3a chronic kidney disease ([place]) 08/14/2015  . GERD (gastroesophageal reflux disease) 08/14/2015  . Uncontrolled type 2 diabetes mellitus with chronic  kidney disease ([place]) 08/13/2015  . 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[place]) 08/13/2015  . CKD (chronic kidney disease), stage III ([place]) 02/11/2015  . Hyperlipidemia associated with type 2 diabetes mellitus ([place]) 02/11/2015  . BPH (benign prostatic hyperplasia) 02/11/2015   PCP:  Venita Lick, NP Pharmacy:   [facility] - [place], [address] [place] [address] Phone: [phone] Fax: [phone]  [facility] [phone] [address], [address] [place] [address] Phone: [phone] Fax: [phone]  [place], [place] [address] [address] Phone: [phone] Fax: [phone]     Social Determinants of Health (SDOH) Interventions    Readmission Risk Interventions No flowsheet data found.

## 2020-07-08 NOTE — NC FL2 (Signed)
[place] LEVEL OF CARE SCREENING TOOL     IDENTIFICATION  Patient Name: Scott Lawson. Birthdate: 02/19/1941 Sex: male Admission Date (Current Location): 06/20/2020  [place] and [place] Number:  Engineering geologist and Address:  [facility], [address] [place], [place] [address]      Provider Number: [id number]  Attending Physician Name and Address:  Jennye Boroughs, MD  Relative Name and Phone Number:       Current Level of Care: Hospital Recommended Level of Care: [place] Prior Approval Number:    Date Approved/Denied:   PASRR Number: [id number]  Discharge Plan: SNF    Current Diagnoses: Patient Active Problem List   Diagnosis Date Noted  . Hyperglycemia due to type 2 diabetes mellitus ([place]) 06/20/2020  . Generalized weakness 06/20/2020  . Pneumonia due to COVID-19 virus 06/20/2020  . Acute respiratory failure due to COVID-19 ([place]) 06/20/2020  . Obesity (BMI 30-39.9) 06/20/2020  . Morbid obesity ([place]) 03/22/2020  . Atherosclerosis of aorta ([place]) 10/12/2019  . Long-term insulin use ([place]) 10/12/2019  . Vitamin B12 deficiency 06/28/2018  . Prostate cancer ([place]) 06/27/2018  . Psoriasis 08/16/2017  . Advanced care planning/counseling discussion 12/01/2016  . Acute renal failure superimposed on stage 3a chronic kidney disease ([place]) 08/14/2015  . GERD (gastroesophageal reflux disease) 08/14/2015  . Uncontrolled type 2 diabetes mellitus with chronic kidney disease ([place]) 08/13/2015  . 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[place]) 08/13/2015  . CKD (chronic kidney disease), stage III ([place]) 02/11/2015  . Hyperlipidemia associated with type 2 diabetes mellitus ([place]) 02/11/2015  . BPH (benign prostatic hyperplasia) 02/11/2015    Orientation RESPIRATION BLADDER Height & Weight     Self,Situation,Time,Place  O2 (4L) Continent Weight: 202 lb 11.2 oz (91.9 kg) Height:  5\' 9"   (175.3 cm)  BEHAVIORAL SYMPTOMS/MOOD NEUROLOGICAL BOWEL NUTRITION STATUS      Continent Diet (regular diet)  AMBULATORY STATUS COMMUNICATION OF NEEDS Skin   Limited Assist Verbally Other (Comment) (Open wound on arm)                       Personal Care Assistance Level of Assistance  Bathing,Feeding,Dressing Bathing Assistance: Limited assistance Feeding assistance: Independent Dressing Assistance: Limited assistance     Functional Limitations Info  Sight,Hearing,Speech Sight Info: Adequate Hearing Info: Adequate Speech Info: Adequate    SPECIAL CARE FACTORS FREQUENCY  PT (By licensed PT),OT (By licensed OT)     PT Frequency: 5x OT Frequency: 5x            Contractures Contractures Info: Not present    Additional Factors Info  Code Status,Allergies,Isolation Precautions Code Status Info: full code Allergies Info: gabapentin     Isolation Precautions Info: Covid + 06/20/20     Current Medications (07/08/2020):  This is the current hospital active medication list Current Facility-Administered Medications  Medication Dose Route Frequency Provider Last Rate Last Admin  . acetaminophen (TYLENOL) tablet 650 mg  650 mg Oral Q6H PRN Athena Masse, MD   650 mg at 07/01/20 2148  . albuterol (VENTOLIN HFA) 108 (90 Base) MCG/ACT inhaler 2 puff  2 puff Inhalation Q6H Athena Masse, MD   2 puff at 07/08/20 0831  . ALPRAZolam Duanne Moron) tablet 0.25 mg  0.25 mg Oral TID PRN Flora Lipps, MD   0.25 mg at 07/06/20 0110  . alum & mag hydroxide-simeth (MAALOX/MYLANTA) 200-200-20 MG/5ML suspension 30 mL  30 mL Oral  Q6H PRN Lang Snow, NP   30 mL at 07/08/20 1125  . ascorbic acid (VITAMIN C) tablet 500 mg  500 mg Oral Daily Athena Masse, MD   500 mg at 07/08/20 0829  . aspirin EC tablet 81 mg  81 mg Oral Daily Athena Masse, MD   81 mg at 07/08/20 8546  . atorvastatin (LIPITOR) tablet 80 mg  80 mg Oral Daily Athena Masse, MD   80 mg at 07/08/20 0831  . bisacodyl  (DULCOLAX) suppository 10 mg  10 mg Rectal Daily PRN Ralene Muskrat B, MD      . Chlorhexidine Gluconate Cloth 2 % PADS 6 each  6 each Topical Daily Val Riles, MD   6 each at 07/08/20 [phone]  . chlorpheniramine-HYDROcodone (TUSSIONEX) 10-8 MG/5ML suspension 5 mL  5 mL Oral Q12H PRN Athena Masse, MD   5 mL at 07/08/20 1125  . enoxaparin (LOVENOX) injection 40 mg  40 mg Subcutaneous Q24H Oswald Hillock, RPH   40 mg at 07/07/20 2207  . guaiFENesin-dextromethorphan (ROBITUSSIN DM) 100-10 MG/5ML syrup 10 mL  10 mL Oral Q4H PRN Athena Masse, MD   10 mL at 07/07/20 2223  . insulin aspart (novoLOG) injection 0-20 Units  0-20 Units Subcutaneous TID AC Rust-Chester, Britton L, NP   7 Units at 07/08/20 1228  . insulin aspart (novoLOG) injection 0-5 Units  0-5 Units Subcutaneous QHS Ralene Muskrat B, MD   3 Units at 07/06/20 2141  . insulin aspart (novoLOG) injection 6 Units  6 Units Subcutaneous TID WC Ralene Muskrat B, MD   6 Units at 07/08/20 1228  . insulin detemir (LEVEMIR) injection 6 Units  6 Units Subcutaneous BID Jennye Boroughs, MD      . linagliptin (TRADJENTA) tablet 5 mg  5 mg Oral Daily Athena Masse, MD   5 mg at 07/08/20 0829  . loperamide (IMODIUM) capsule 2 mg  2 mg Oral PRN Bradly Bienenstock, NP   2 mg at 06/25/20 1536  . ondansetron (ZOFRAN) tablet 4 mg  4 mg Oral Q6H PRN Athena Masse, MD   4 mg at 07/05/20 1800   Or  . ondansetron (ZOFRAN) injection 4 mg  4 mg Intravenous Q6H PRN Athena Masse, MD   4 mg at 07/06/20 0110  . polyethylene glycol (MIRALAX / GLYCOLAX) packet 17 g  17 g Oral Daily Ralene Muskrat B, MD   17 g at 07/07/20 0923  . senna-docusate (Senokot-S) tablet 1 tablet  1 tablet Oral BID Ralene Muskrat B, MD   1 tablet at 07/08/20 [phone]  . sodium chloride (OCEAN) 0.65 % nasal spray 1 spray  1 spray Each Nare PRN Val Riles, MD   1 spray at 06/29/20 [phone]  . tamsulosin (FLOMAX) capsule 0.4 mg  0.4 mg Oral Daily Judd Gaudier V, MD   0.4 mg at  07/08/20 0829  . zinc sulfate capsule 220 mg  220 mg Oral Daily Athena Masse, MD   220 mg at 07/08/20 2993     Discharge Medications: Please see discharge summary for a list of discharge medications.  Relevant Imaging Results:  Relevant Lab Results:   Additional Information [phone]  Gerrianne Scale Kendrick Haapala, LCSW

## 2020-07-08 NOTE — Progress Notes (Signed)
Inpatient Diabetes Program Recommendations  AACE/ADA: New Consensus Statement on Inpatient Glycemic Control (2015)  Target Ranges:  Prepandial:   less than 140 mg/dL      [facility] postprandial:   less than 180 mg/dL (1-2 hours)      Critically ill patients:  140 - 180 mg/dL   Lab Results  Component Value Date   GLUCAP 227 (H) 07/08/2020   HGBA1C 8.4 (H) 06/28/2020    Review of Glycemic Control Results for KENTARIUS, PARTINGTON (MRN [id number]) as of 07/08/2020 12:40  Ref. Range 07/07/2020 08:50 07/07/2020 11:22 07/07/2020 15:48 07/07/2020 21:40 07/08/2020 08:01 07/08/2020 11:24  Glucose-Capillary Latest Ref Range: 70 - 99 mg/dL 204 (H) 338 (H) 163 (H) 125 (H) 217 (H) 227 (H)   Diabetes history: DM 2 Outpatient Diabetes medications: Lantus 25 units daily, Humalog 10 units tid with meals, metformin 1000 mg bid Current orders for Inpatient glycemic control:  Novolog resistant tid with meals and HS Novolog 6 units tid with meals Levemir 5 units q HS Tradgenta 5 mg daily Inpatient Diabetes Program Recommendations:    Please consider increase of Levemir to 6 units bid.    Thanks  Adah Perl, RN, BC-ADM Inpatient Diabetes Coordinator Pager [phone] (8a-5p)

## 2020-07-08 NOTE — Consult Note (Signed)
[place] Nurse Consult Note: Reason for Consult: LEft arm wound with eschar and surrounding erythema and induration.  Patient states this was after an IV stick.  Tender to touch.   Wound type:inflammatory Pressure Injury POA: NA Measurement: 2 cm x 2 cm eschar with circumferential erythema and induration Wound POE:UMPNTI dry and dark  Drainage (amount, consistency, odor) none Periwound:erythema Dressing procedure/placement/frequency:Cleanse left wound with NS and pat dry .Apply vaseline gauze to wound bed to soften.  Cover with dry gauze and kerlix.  Change Monday and Thursday.  Will not follow at this time.  Please re-consult if needed.  Domenic Moras MSN, RN, FNP-BC CWON Wound, Ostomy, Continence Nurse Pager [phone]

## 2020-07-08 NOTE — Progress Notes (Addendum)
Progress Note    Scott Lawson.  HYW:[id number] DOB: March 25, 1941  DOA: 06/20/2020 PCP: Venita Lick, NP      Brief Narrative:    Medical records reviewed and are as summarized below:  Scott Lawson. is a 80 y.o. male       Assessment/Plan:   Principal Problem:   Pneumonia due to COVID-19 virus Active Problems:   Acute renal failure superimposed on stage 3a chronic kidney disease ([place])   Hyperglycemia due to type 2 diabetes mellitus ([place])   Generalized weakness   Acute respiratory failure due to COVID-19 ([place])   Obesity (BMI 30-39.9) Hypotension   Body mass index is 29.93 kg/m.    PLAN  He completed remdesivir on 06/24/2020.  Completed baricitinib on 07/04/2020.  He completed steroids on 07/05/2020.  Frequent prone position as tolerated.  Oxygen requirement is down to 4 L/min via nasal cannula.  Continue to taper down oxygen as able.   Increase Levemir from 5 units nightly to 6 units twice daily for hyperglycemia.  Continue NovoLog and linagliptin.  Creatinine is stable.  Continue to monitor closely.  Left forearm superficial wound: Doubt cellulitis at this time.  Consulted wound care nurse.  Appreciate wound care nurse's recommendation.  Leukocytosis is likely due to steroids.  Monitor WBC  Awaiting placement to SNF.  Diet Order            Diet regular Room service appropriate? Yes; Fluid consistency: Thin  Diet effective now                    Consultants:  Intensivist  Procedures:  None    Medications:   . albuterol  2 puff Inhalation Q6H  . vitamin C  500 mg Oral Daily  . aspirin EC  81 mg Oral Daily  . atorvastatin  80 mg Oral Daily  . Chlorhexidine Gluconate Cloth  6 each Topical Daily  . enoxaparin (LOVENOX) injection  40 mg Subcutaneous Q24H  . insulin aspart  0-20 Units Subcutaneous TID AC  . insulin aspart  0-5 Units Subcutaneous QHS  . insulin aspart  6 Units Subcutaneous TID WC  . insulin detemir  6  Units Subcutaneous BID  . linagliptin  5 mg Oral Daily  . polyethylene glycol  17 g Oral Daily  . senna-docusate  1 tablet Oral BID  . tamsulosin  0.4 mg Oral Daily  . zinc sulfate  220 mg Oral Daily   Continuous Infusions:   Anti-infectives (From admission, onward)   Start     Dose/Rate Route Frequency Ordered Stop   06/21/20 1000  remdesivir 100 mg in sodium chloride 0.9 % 100 mL IVPB       "Followed by" Linked Group Details   100 mg 200 mL/hr over 30 Minutes Intravenous Daily 06/20/20 2100 06/24/20 1027   06/21/20 1000  remdesivir 100 mg in sodium chloride 0.9 % 100 mL IVPB  Status:  Discontinued       "Followed by" Linked Group Details   100 mg 200 mL/hr over 30 Minutes Intravenous Daily 06/20/20 2207 06/20/20 2217   06/20/20 2200  remdesivir 200 mg in sodium chloride 0.9% 250 mL IVPB  Status:  Discontinued       "Followed by" Linked Group Details   200 mg 580 mL/hr over 30 Minutes Intravenous Once 06/20/20 2207 06/20/20 2217   06/20/20 2115  remdesivir 200 mg in sodium chloride 0.9% 250 mL IVPB       "  Followed by" Linked Group Details   200 mg 580 mL/hr over 30 Minutes Intravenous Once 06/20/20 2100 06/20/20 2333             Family Communication/Anticipated D/C date and plan/Code Status   DVT prophylaxis:      Code Status: Full Code  Family Communication: None Disposition Plan:    Status is: Inpatient  Remains inpatient appropriate because:Inpatient level of care appropriate due to severity of illness and Hypoxia   Dispo: The patient is from: Home              Anticipated d/c is to: SNF              Anticipated d/c date is: 2 days              Patient currently is not medically stable to d/c.           Subjective:   Interval events noted.  He feels better.  Objective:    Vitals:   07/08/20 0200 07/08/20 0300 07/08/20 0803 07/08/20 1159  BP:  104/60 100/76 (!) 110/58  Pulse: 95  83   Resp: (!) 25  20   Temp:  98.5 F (36.9 C) 98.3  F (36.8 C) 98.4 F (36.9 C)  TempSrc:   Oral Oral  SpO2: 100%  100%   Weight:  91.9 kg    Height:       No data found.   Intake/Output Summary (Last 24 hours) at 07/08/2020 1333 Last data filed at 07/08/2020 0810 Gross per 24 hour  Intake --  Output 320 ml  Net -320 ml   Filed Weights   07/05/20 0253 07/07/20 0300 07/08/20 0300  Weight: 89.5 kg 90 kg 91.9 kg    Exam:   GEN: NAD SKIN: Wound on left upper forearm without any drainage EYES: EOMI ENT: MMM CV: RRR PULM: CTA B ABD: soft, ND, NT, +BS CNS: AAO x 3, non focal EXT: No edema or tenderness             Data Reviewed:   I have personally reviewed following labs and imaging studies:  Labs: Labs show the following:   Basic Metabolic Panel: Recent Labs  Lab 07/04/20 0437 07/05/20 0457 07/06/20 0436 07/07/20 0400 07/08/20 0300  NA 138 138 136 135 133*  K 4.9 4.4 4.9 4.8 5.0  CL 95* 98 97* 96* 94*  CO2 33* 29 26 30 28   GLUCOSE 144* 59* 92 201* 269*  BUN 37* 34* 31* 32* 29*  CREATININE 1.36*  1.32* 1.21 1.25* 1.34* 1.36*  CALCIUM 8.3* 8.6* 8.5* 8.2* 8.2*  MG 2.1 2.3 2.1 2.0 2.2  PHOS 3.8 4.3 4.4 3.3 2.5   GFR Estimated Creatinine Clearance: 50.1 mL/min (A) (by C-G formula based on SCr of 1.36 mg/dL (H)). Liver Function Tests: Recent Labs  Lab 07/04/20 0437 07/05/20 0457 07/06/20 0436 07/07/20 0400 07/08/20 0300  ALBUMIN 2.5* 2.6* 2.3* 2.5* 2.5*   No results for input(s): LIPASE, AMYLASE in the last 168 hours. No results for input(s): AMMONIA in the last 168 hours. Coagulation profile No results for input(s): INR, PROTIME in the last 168 hours.  CBC: Recent Labs  Lab 07/04/20 0437 07/05/20 0457 07/06/20 0436 07/07/20 0400 07/08/20 0300  WBC 15.7* 15.0* 14.3* 13.7* 13.2*  NEUTROABS 12.9* 11.6* 11.3* 11.0* 10.9*  HGB 13.3 13.7 13.4 12.5* 12.6*  HCT 41.2 42.0 40.3 39.1 38.0*  MCV 89.2 89.7 89.6 90.7 89.8  PLT 388 355 251 264 255  Cardiac Enzymes: No results for  input(s): CKTOTAL, CKMB, CKMBINDEX, TROPONINI in the last 168 hours. BNP (last 3 results) No results for input(s): PROBNP in the last 8760 hours. CBG: Recent Labs  Lab 07/07/20 1122 07/07/20 1548 07/07/20 2140 07/08/20 0801 07/08/20 1124  GLUCAP 338* 163* 125* 217* 227*   D-Dimer: No results for input(s): DDIMER in the last 72 hours. Hgb A1c: No results for input(s): HGBA1C in the last 72 hours. Lipid Profile: No results for input(s): CHOL, HDL, LDLCALC, TRIG, CHOLHDL, LDLDIRECT in the last 72 hours. Thyroid function studies: No results for input(s): TSH, T4TOTAL, T3FREE, THYROIDAB in the last 72 hours.  Invalid input(s): FREET3 Anemia work up: No results for input(s): VITAMINB12, FOLATE, FERRITIN, TIBC, IRON, RETICCTPCT in the last 72 hours. Sepsis Labs: Recent Labs  Lab 07/05/20 0457 07/06/20 0436 07/07/20 0400 07/08/20 0300  WBC 15.0* 14.3* 13.7* 13.2*    Microbiology No results found for this or any previous visit (from the past 240 hour(s)).  Procedures and diagnostic studies:  No results found.             LOS: 18 days   Peter Copywriter, advertising on [email or web address]. If 7PM-7AM, please contact night-coverage at [email or web address]     07/08/2020, 1:33 PM

## 2020-07-08 NOTE — Progress Notes (Signed)
Pt had 13 beat run of VT.

## 2020-07-09 DIAGNOSIS — J9600 Acute respiratory failure, unspecified whether with hypoxia or hypercapnia: Secondary | ICD-10-CM | POA: Diagnosis not present

## 2020-07-09 DIAGNOSIS — U071 COVID-19: Secondary | ICD-10-CM | POA: Diagnosis not present

## 2020-07-09 DIAGNOSIS — J1282 Pneumonia due to coronavirus disease 2019: Secondary | ICD-10-CM | POA: Diagnosis not present

## 2020-07-09 LAB — RENAL FUNCTION PANEL
Albumin: 2.6 g/dL — ABNORMAL LOW (ref 3.5–5.0)
Anion gap: 11 (ref 5–15)
BUN: 22 mg/dL (ref 8–23)
CO2: 29 mmol/L (ref 22–32)
Calcium: 8.3 mg/dL — ABNORMAL LOW (ref 8.9–10.3)
Chloride: 94 mmol/L — ABNORMAL LOW (ref 98–111)
Creatinine, Ser: 1.22 mg/dL (ref 0.61–1.24)
GFR, Estimated: >60 mL/min (ref >60)
Glucose, Bld: 161 mg/dL — ABNORMAL HIGH (ref 70–99)
Phosphorus: 3.4 mg/dL (ref 2.5–4.6)
Potassium: 4.6 mmol/L (ref 3.5–5.1)
Sodium: 134 mmol/L — ABNORMAL LOW (ref 135–145)

## 2020-07-09 LAB — CBC WITH DIFFERENTIAL/PLATELET
Abs Immature Granulocytes: 0.12 10*3/uL — ABNORMAL HIGH (ref 0.00–0.07)
Basophils Absolute: 0 10*3/uL (ref 0.0–0.1)
Basophils Relative: 0 %
Eosinophils Absolute: 0.4 10*3/uL (ref 0.0–0.5)
Eosinophils Relative: 4 %
HCT: 37.6 % — ABNORMAL LOW (ref 39.0–52.0)
Hemoglobin: 12 g/dL — ABNORMAL LOW (ref 13.0–17.0)
Immature Granulocytes: 1 %
Lymphocytes Relative: 8 %
Lymphs Abs: 1 10*3/uL (ref 0.7–4.0)
MCH: 29.1 pg (ref 26.0–34.0)
MCHC: 31.9 g/dL (ref 30.0–36.0)
MCV: 91.3 fL (ref 80.0–100.0)
Monocytes Absolute: 1.2 10*3/uL — ABNORMAL HIGH (ref 0.1–1.0)
Monocytes Relative: 9 %
Neutro Abs: 9.6 10*3/uL — ABNORMAL HIGH (ref 1.7–7.7)
Neutrophils Relative %: 78 %
Platelets: 216 10*3/uL (ref 150–400)
RBC: 4.12 MIL/uL — ABNORMAL LOW (ref 4.22–5.81)
RDW: 13.7 % (ref 11.5–15.5)
WBC: 12.3 10*3/uL — ABNORMAL HIGH (ref 4.0–10.5)
nRBC: 0 % (ref 0.0–0.2)

## 2020-07-09 LAB — GLUCOSE, CAPILLARY
Glucose-Capillary: 178 mg/dL — ABNORMAL HIGH (ref 70–99)
Glucose-Capillary: 212 mg/dL — ABNORMAL HIGH (ref 70–99)
Glucose-Capillary: 212 mg/dL — ABNORMAL HIGH (ref 70–99)
Glucose-Capillary: 238 mg/dL — ABNORMAL HIGH (ref 70–99)

## 2020-07-09 LAB — MAGNESIUM: Magnesium: 2.2 mg/dL (ref 1.7–2.4)

## 2020-07-09 MED ORDER — DOXYCYCLINE HYCLATE 100 MG PO TABS
100.0000 mg | ORAL_TABLET | Freq: Two times a day (BID) | ORAL | Status: DC
  Administered 2020-07-09 – 2020-07-10 (×3): 100 mg via ORAL
  Filled 2020-07-09 (×3): qty 1

## 2020-07-09 NOTE — Progress Notes (Signed)
Physical Therapy Treatment Patient Details Name: Scott Lawson. MRN: [id number] DOB: 07-04-1940 Today's Date: 07/09/2020    History of Present Illness presented to ER secondary to general malaise, lightheadedness and SOB; admitted for management of acute respiratory failure due to COVID-19, PNA    PT Comments    Patient agreeable to PT. Patient required minimal assistance for bed mobility with cues for technique. Patient has increased respiration rate to 30 in sitting position and Sp02 decreased to 75% on 5 L 02. Therapist increased oxygen to 6 L, only reaching 84% after several minutes, and then to 7L, reaching 88-89% after ~ 5-6 minutes. Verbal cues for breathing techniques provided throughout. Unable to progress to standing due to limited activity tolerance. Oxygen left at 7L 02 and nurse was alerted. Recommend to continue PT to maximize independence and address remaining functional limitations. SNF is recommended at discharge.    Follow Up Recommendations  SNF     Equipment Recommendations   (to de determined at next level of care)    Recommendations for Other Services       Precautions / Restrictions Precautions Precautions: Fall Restrictions Weight Bearing Restrictions: No    Mobility  Bed Mobility Overal bed mobility: Needs Assistance Bed Mobility: Supine to Sit;Sit to Supine     Supine to sit: Min guard Sit to supine: Min assist   General bed mobility comments: increased assistance required for return to bed for LE support. increased time required to complete tasks. verbal cues for technique  Transfers                 General transfer comment: unable to attemt due to limited endurance and decrease in Sp02 with activity  Ambulation/Gait                 Stairs             Wheelchair Mobility    Modified Rankin (Stroke Patients Only)       Balance Overall balance assessment: Needs assistance Sitting-balance support: No upper  extremity supported Sitting balance-Leahy Scale: Good Sitting balance - Comments: no loss of balance in sitting position. sitting tolerance limited to ~ 3-4 minutes                                    Cognition Arousal/Alertness: Awake/alert Behavior During Therapy: WFL for tasks assessed/performed Overall Cognitive Status: Within Functional Limits for tasks assessed                                        Exercises      General Comments        Pertinent Vitals/Pain Pain Assessment: No/denies pain    Home Living                      Prior Function            PT Goals (current goals can now be found in the care plan section) Acute Rehab PT Goals Patient Stated Goal: to return home PT Goal Formulation: With patient Time For Goal Achievement: 07/18/20 Potential to Achieve Goals: Good Progress towards PT goals: Progressing toward goals    Frequency    Min 2X/week      PT Plan Current plan remains appropriate    Co-evaluation  AM-PAC PT "6 Clicks" Mobility   Outcome Measure  Help needed turning from your back to your side while in a flat bed without using bedrails?: None Help needed moving from lying on your back to sitting on the side of a flat bed without using bedrails?: A Little Help needed moving to and from a bed to a chair (including a wheelchair)?: A Little Help needed standing up from a chair using your arms (e.g., wheelchair or bedside chair)?: A Lot Help needed to walk in hospital room?: A Lot Help needed climbing 3-5 steps with a railing? : A Lot 6 Click Score: 16    End of Session Equipment Utilized During Treatment: Oxygen Activity Tolerance: Patient limited by fatigue Patient left: in bed;with call bell/phone within reach;with bed alarm set (set-up with lunch tray) Nurse Communication: Mobility status (oxygen requirements) PT Visit Diagnosis: Muscle weakness (generalized)  (M62.81);Difficulty in walking, not elsewhere classified (R26.2)     Time: 0962-8366 PT Time Calculation (min) (ACUTE ONLY): 39 min  Charges:  $Therapeutic Activity: 38-52 mins                     Minna Merritts, PT, MPT    Percell Locus 07/09/2020, 3:21 PM

## 2020-07-09 NOTE — Consult Note (Signed)
Reason for Consult: Skin necrosis left forearm Referring Physician: Dr. Bud Face H Kelm [name] is an 80 y.o. male.  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  Apparently there is extravasation and he developed hematoma he has developed a dry ulcer over this area with skin necrosis approximately 2 cm in diameter.  There is a raised area around this he 1 to be sure did not require surgical treatment  Past Medical History:  Diagnosis Date  . BPH (benign prostatic hyperplasia)   . Diabetes mellitus without complication ([place])   . Elevated PSA   . GERD (gastroesophageal reflux disease)   . Hypertension   . Hypertriglyceridemia   . Nocturia     Past Surgical History:  Procedure Laterality Date  . APPENDECTOMY    . CARPAL TUNNEL RELEASE Left 12/11/2015   Procedure: CARPAL TUNNEL RELEASE;  Surgeon: Earnestine Leys, MD;  Location: [facility] ORS;  Service: Orthopedics;  Laterality: Left;  . EYE SURGERY     laser  . TRANSURETHRAL RESECTION OF PROSTATE N/A 08/02/2017   Procedure: TRANSURETHRAL RESECTION OF THE PROSTATE (TURP);  Surgeon: Hollice Espy, MD;  Location: [facility] ORS;  Service: Urology;  Laterality: N/A;    Family History  Problem Relation Age of Onset  . Cancer Mother        lung  . Heart disease Father   . Diabetes Brother   . Prostate cancer Neg Hx   . Bladder Cancer Neg Hx   . Kidney cancer Neg Hx     Social History:  reports that he has never smoked. He has never used smokeless tobacco. He reports that he does not drink alcohol and does not use drugs.  Allergies:  Allergies  Allergen Reactions  . Gabapentin Other (See Comments)    "made me fall out"    Medications: I have reviewed the patient's current medications.  Results for orders placed or performed during the hospital encounter of 06/20/20 (from the past 48 hour(s))  Glucose, capillary     Status: Abnormal   Collection  Time: 07/07/20  3:48 PM  Result Value Ref Range   Glucose-Capillary 163 (H) 70 - 99 mg/dL    Comment: Glucose reference range applies only to samples taken after fasting for at least 8 hours.  Glucose, capillary     Status: Abnormal   Collection Time: 07/07/20  9:40 PM  Result Value Ref Range   Glucose-Capillary 125 (H) 70 - 99 mg/dL    Comment: Glucose reference range applies only to samples taken after fasting for at least 8 hours.  CBC with Differential/Platelet     Status: Abnormal   Collection Time: 07/08/20  3:00 AM  Result Value Ref Range   WBC 13.2 (H) 4.0 - 10.5 K/uL   RBC 4.23 4.22 - 5.81 MIL/uL   Hemoglobin 12.6 (L) 13.0 - 17.0 g/dL   HCT 38.0 (L) 39.0 - 52.0 %   MCV 89.8 80.0 - 100.0 fL   MCH 29.8 26.0 - 34.0 pg   MCHC 33.2 30.0 - 36.0 g/dL   RDW 13.7 11.5 - 15.5 %   Platelets 255 150 - 400 K/uL   nRBC 0.0 0.0 - 0.2 %   Neutrophils Relative % 83 %   Neutro Abs 10.9 (H) 1.7 - 7.7 K/uL   Lymphocytes Relative 6 %   Lymphs Abs 0.7 0.7 - 4.0 K/uL   Monocytes Relative 8 %  Monocytes Absolute 1.1 (H) 0.1 - 1.0 K/uL   Eosinophils Relative 2 %   Eosinophils Absolute 0.3 0.0 - 0.5 K/uL   Basophils Relative 0 %   Basophils Absolute 0.0 0.0 - 0.1 K/uL   Immature Granulocytes 1 %   Abs Immature Granulocytes 0.14 (H) 0.00 - 0.07 K/uL    Comment: Performed at [facility], [address] [place], [place] [address]  Renal function panel     Status: Abnormal   Collection Time: 07/08/20  3:00 AM  Result Value Ref Range   Sodium 133 (L) 135 - 145 mmol/L   Potassium 5.0 3.5 - 5.1 mmol/L   Chloride 94 (L) 98 - 111 mmol/L   CO2 28 22 - 32 mmol/L   Glucose, Bld 269 (H) 70 - 99 mg/dL    Comment: Glucose reference range applies only to samples taken after fasting for at least 8 hours.   BUN 29 (H) 8 - 23 mg/dL   Creatinine, Ser 1.36 (H) 0.61 - 1.24 mg/dL   Calcium 8.2 (L) 8.9 - 10.3 mg/dL   Phosphorus 2.5 2.5 - 4.6 mg/dL   Albumin 2.5 (L) 3.5 - 5.0 g/dL   GFR,  Estimated 53 (L) >60 mL/min    Comment: (NOTE) Calculated using the CKD-EPI Creatinine Equation (2021)    Anion gap 11 5 - 15    Comment: Performed at [facility], [address], [place], [place] [address]  Magnesium     Status: None   Collection Time: 07/08/20  3:00 AM  Result Value Ref Range   Magnesium 2.2 1.7 - 2.4 mg/dL    Comment: Performed at [facility], [address] [place], [place] [address]  Glucose, capillary     Status: Abnormal   Collection Time: 07/08/20  8:01 AM  Result Value Ref Range   Glucose-Capillary 217 (H) 70 - 99 mg/dL    Comment: Glucose reference range applies only to samples taken after fasting for at least 8 hours.  Glucose, capillary     Status: Abnormal   Collection Time: 07/08/20 11:24 AM  Result Value Ref Range   Glucose-Capillary 227 (H) 70 - 99 mg/dL    Comment: Glucose reference range applies only to samples taken after fasting for at least 8 hours.  Glucose, capillary     Status: Abnormal   Collection Time: 07/08/20  4:16 PM  Result Value Ref Range   Glucose-Capillary 299 (H) 70 - 99 mg/dL    Comment: Glucose reference range applies only to samples taken after fasting for at least 8 hours.  Glucose, capillary     Status: Abnormal   Collection Time: 07/08/20  8:51 PM  Result Value Ref Range   Glucose-Capillary 167 (H) 70 - 99 mg/dL    Comment: Glucose reference range applies only to samples taken after fasting for at least 8 hours.  CBC with Differential/Platelet     Status: Abnormal   Collection Time: 07/09/20  5:28 AM  Result Value Ref Range   WBC 12.3 (H) 4.0 - 10.5 K/uL   RBC 4.12 (L) 4.22 - 5.81 MIL/uL   Hemoglobin 12.0 (L) 13.0 - 17.0 g/dL   HCT 37.6 (L) 39.0 - 52.0 %   MCV 91.3 80.0 - 100.0 fL   MCH 29.1 26.0 - 34.0 pg   MCHC 31.9 30.0 - 36.0 g/dL   RDW 13.7 11.5 - 15.5 %   Platelets 216 150 - 400 K/uL   nRBC 0.0 0.0 - 0.2 %  Neutrophils Relative % 78 %   Neutro Abs 9.6 (H) 1.7 - 7.7 K/uL   Lymphocytes  Relative 8 %   Lymphs Abs 1.0 0.7 - 4.0 K/uL   Monocytes Relative 9 %   Monocytes Absolute 1.2 (H) 0.1 - 1.0 K/uL   Eosinophils Relative 4 %   Eosinophils Absolute 0.4 0.0 - 0.5 K/uL   Basophils Relative 0 %   Basophils Absolute 0.0 0.0 - 0.1 K/uL   Immature Granulocytes 1 %   Abs Immature Granulocytes 0.12 (H) 0.00 - 0.07 K/uL    Comment: Performed at [facility], [address] [place], [place] [address]  Renal function panel     Status: Abnormal   Collection Time: 07/09/20  5:28 AM  Result Value Ref Range   Sodium 134 (L) 135 - 145 mmol/L   Potassium 4.6 3.5 - 5.1 mmol/L   Chloride 94 (L) 98 - 111 mmol/L   CO2 29 22 - 32 mmol/L   Glucose, Bld 161 (H) 70 - 99 mg/dL    Comment: Glucose reference range applies only to samples taken after fasting for at least 8 hours.   BUN 22 8 - 23 mg/dL   Creatinine, Ser 1.22 0.61 - 1.24 mg/dL   Calcium 8.3 (L) 8.9 - 10.3 mg/dL   Phosphorus 3.4 2.5 - 4.6 mg/dL   Albumin 2.6 (L) 3.5 - 5.0 g/dL   GFR, Estimated >60 >60 mL/min    Comment: (NOTE) Calculated using the CKD-EPI Creatinine Equation (2021)    Anion gap 11 5 - 15    Comment: Performed at [facility], [address], [place], [place] [address]  Magnesium     Status: None   Collection Time: 07/09/20  5:28 AM  Result Value Ref Range   Magnesium 2.2 1.7 - 2.4 mg/dL    Comment: Performed at [facility], [address] [place], [place] [address]  Glucose, capillary     Status: Abnormal   Collection Time: 07/09/20  7:57 AM  Result Value Ref Range   Glucose-Capillary 178 (H) 70 - 99 mg/dL    Comment: Glucose reference range applies only to samples taken after fasting for at least 8 hours.  Glucose, capillary     Status: Abnormal   Collection Time: 07/09/20 11:39 AM  Result Value Ref Range   Glucose-Capillary 212 (H) 70 - 99 mg/dL    Comment: Glucose reference range applies only to samples taken after fasting for at least 8 hours.    No results  found.  Review of Systems Blood pressure 111/61, pulse 98, temperature 98.2 F (36.8 C), temperature source Oral, resp. rate 20, height 5\' 9"  (1.753 m), weight 91.9 kg, SpO2 92 %. Physical Exam There is a raised area around the central lesion about a centimeter to 2 cm all the way around elevated approximately half a centimeter from the surrounding skin with slight erythema.  There is no drainage and with pressure there is nothing expressed.  The central area is dark and dry area of skin necrosis.  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  I think is being properly managed with dressing changes as needed.  I think this is something that will resolve with time and will leave him with a scar but nothing surgical required at this time.  Hessie Knows 07/09/2020, 1:20 PM

## 2020-07-09 NOTE — Progress Notes (Signed)
Progress Note    Scott Lawson.  YW:[id number] DOB: Oct 31, 1940  DOA: 06/20/2020 PCP: Venita Lick, NP      Brief Narrative:    Medical records reviewed and are as summarized below:  Scott Lawson. is a 80 y.o. male with medical history significant for hypertension, type II DM, BPH, hyperlipidemia, obesity, CAD, who presented to the hospital with generalized weakness, lightheadedness and increasing shortness of breath.  He was admitted to the hospital for [id number] pneumonia complicated by acute hypoxic respiratory failure.  He is unvaccinated.  He was treated with steroids, remdesivir and baricitinib.  He was treated with oxygen including oxygen via heated humidified high flow nasal cannula.  He had hypoglycemic episodes requiring adjustments in hypoglycemic regimen.  He developed a superficial wound on the left forearm.  He attributed this to infiltration of peripheral IV cannula.  Orthopedic surgery was consulted to evaluate the wound but there is no indication for surgery.  He was started on doxycycline for probable wound infection.  He was evaluated by PT and OT who recommended discharge to SNF.    Assessment/Plan:   Principal Problem:   Pneumonia due to COVID-19 virus Active Problems:   Acute renal failure superimposed on stage 3a chronic kidney disease (HCC)   Hyperglycemia due to type 2 diabetes mellitus (HCC)   Generalized weakness   Acute respiratory failure due to COVID-19 (HCC)   Obesity (BMI 30-39.9) Hypotension   Body mass index is 29.93 kg/m.    PLAN  He completed remdesivir on 06/24/2020.  Completed baricitinib on 07/04/2020.  He completed steroids on 07/05/2020.  Frequent prone position as tolerated.  Oxygen requirements is fluctuating between 5 and 6 L/min.  Continue to taper down oxygen as able.    Increase Levemir from 5 units nightly to 6 units twice daily for hyperglycemia.  Continue NovoLog and linagliptin.  Creatinine is  stable.  Continue to monitor closely.  Left forearm superficial wound with skin necrosis: Start doxycycline for possible wound infection.  Consulted orthopedic surgeon, Dr. Rudene Christians, to look at the wound.  He thinks there is no indication for surgical intervention and it will resolve over time.   Leukocytosis is likely due to steroids.  Monitor WBC  Awaiting placement to SNF.  Diet Order            Diet regular Room service appropriate? Yes; Fluid consistency: Thin  Diet effective now                    Consultants:  Intensivist  Procedures:  None    Medications:   . albuterol  2 puff Inhalation Q6H  . vitamin C  500 mg Oral Daily  . aspirin EC  81 mg Oral Daily  . atorvastatin  80 mg Oral Daily  . Chlorhexidine Gluconate Cloth  6 each Topical Daily  . doxycycline  100 mg Oral Q12H  . enoxaparin (LOVENOX) injection  40 mg Subcutaneous Q24H  . insulin aspart  0-20 Units Subcutaneous TID AC  . insulin aspart  0-5 Units Subcutaneous QHS  . insulin aspart  6 Units Subcutaneous TID WC  . insulin detemir  6 Units Subcutaneous BID  . linagliptin  5 mg Oral Daily  . polyethylene glycol  17 g Oral Daily  . senna-docusate  1 tablet Oral BID  . tamsulosin  0.4 mg Oral Daily  . zinc sulfate  220 mg Oral Daily   Continuous Infusions:   Anti-infectives (From  admission, onward)   Start     Dose/Rate Route Frequency Ordered Stop   07/09/20 1200  doxycycline (VIBRA-TABS) tablet 100 mg        100 mg Oral Every 12 hours 07/09/20 1113     06/21/20 1000  remdesivir 100 mg in sodium chloride 0.9 % 100 mL IVPB       "Followed by" Linked Group Details   100 mg 200 mL/hr over 30 Minutes Intravenous Daily 06/20/20 2100 06/24/20 1027   06/21/20 1000  remdesivir 100 mg in sodium chloride 0.9 % 100 mL IVPB  Status:  Discontinued       "Followed by" Linked Group Details   100 mg 200 mL/hr over 30 Minutes Intravenous Daily 06/20/20 2207 06/20/20 2217   06/20/20 2200  remdesivir 200 mg  in sodium chloride 0.9% 250 mL IVPB  Status:  Discontinued       "Followed by" Linked Group Details   200 mg 580 mL/hr over 30 Minutes Intravenous Once 06/20/20 2207 06/20/20 2217   06/20/20 2115  remdesivir 200 mg in sodium chloride 0.9% 250 mL IVPB       "Followed by" Linked Group Details   200 mg 580 mL/hr over 30 Minutes Intravenous Once 06/20/20 2100 06/20/20 2333             Family Communication/Anticipated D/C date and plan/Code Status   DVT prophylaxis:      Code Status: Full Code  Family Communication: None Disposition Plan:    Status is: Inpatient  Remains inpatient appropriate because:Inpatient level of care appropriate due to severity of illness and Hypoxia   Dispo: The patient is from: Home              Anticipated d/c is to: SNF              Anticipated d/c date is: 2 days              Patient currently is not medically stable to d/c.           Subjective:   Interval events noted.  He was worried about wound on his left forearm.  He has mild pain at the wound site.  Objective:    Vitals:   07/09/20 0400 07/09/20 0758 07/09/20 1140 07/09/20 1538  BP: 102/62 92/72 111/61 112/65  Pulse: 79 76 98 100  Resp: (!) 24 19 20 20   Temp:  98.4 F (36.9 C) 98.2 F (36.8 C) 98.1 F (36.7 C)  TempSrc:  Oral Oral Oral  SpO2: 93% 94% 92% 91%  Weight:      Height:       No data found.   Intake/Output Summary (Last 24 hours) at 07/09/2020 1552 Last data filed at 07/09/2020 1540 Gross per 24 hour  Intake 600 ml  Output 725 ml  Net -125 ml   Filed Weights   07/05/20 0253 07/07/20 0300 07/08/20 0300  Weight: 89.5 kg 90 kg 91.9 kg    Exam:   GEN: NAD SKIN: Superficial wound with eschar surrounded by an area of erythema and induration on left anterior forearm. EYES: EOMI ENT: MMM CV: RRR PULM: CTA B ABD: soft, ND, NT, +BS CNS: AAO x 3, non focal EXT: No edema or tenderness               Data Reviewed:   I have  personally reviewed following labs and imaging studies:  Labs: Labs show the following:   Basic Metabolic Panel: Recent Labs  Lab 07/05/20 0457 07/06/20 0436 07/07/20 0400 07/08/20 0300 07/09/20 0528  NA 138 136 135 133* 134*  K 4.4 4.9 4.8 5.0 4.6  CL 98 97* 96* 94* 94*  CO2 29 26 30 28 29   GLUCOSE 59* 92 201* 269* 161*  BUN 34* 31* 32* 29* 22  CREATININE 1.21 1.25* 1.34* 1.36* 1.22  CALCIUM 8.6* 8.5* 8.2* 8.2* 8.3*  MG 2.3 2.1 2.0 2.2 2.2  PHOS 4.3 4.4 3.3 2.5 3.4   GFR Estimated Creatinine Clearance: 55.9 mL/min (by C-G formula based on SCr of 1.22 mg/dL). Liver Function Tests: Recent Labs  Lab 07/05/20 0457 07/06/20 0436 07/07/20 0400 07/08/20 0300 07/09/20 0528  ALBUMIN 2.6* 2.3* 2.5* 2.5* 2.6*   No results for input(s): LIPASE, AMYLASE in the last 168 hours. No results for input(s): AMMONIA in the last 168 hours. Coagulation profile No results for input(s): INR, PROTIME in the last 168 hours.  CBC: Recent Labs  Lab 07/05/20 0457 07/06/20 0436 07/07/20 0400 07/08/20 0300 07/09/20 0528  WBC 15.0* 14.3* 13.7* 13.2* 12.3*  NEUTROABS 11.6* 11.3* 11.0* 10.9* 9.6*  HGB 13.7 13.4 12.5* 12.6* 12.0*  HCT 42.0 40.3 39.1 38.0* 37.6*  MCV 89.7 89.6 90.7 89.8 91.3  PLT 355 251 264 255 216   Cardiac Enzymes: No results for input(s): CKTOTAL, CKMB, CKMBINDEX, TROPONINI in the last 168 hours. BNP (last 3 results) No results for input(s): PROBNP in the last 8760 hours. CBG: Recent Labs  Lab 07/08/20 1124 07/08/20 1616 07/08/20 2051 07/09/20 0757 07/09/20 1139  GLUCAP 227* 299* 167* 178* 212*   D-Dimer: No results for input(s): DDIMER in the last 72 hours. Hgb A1c: No results for input(s): HGBA1C in the last 72 hours. Lipid Profile: No results for input(s): CHOL, HDL, LDLCALC, TRIG, CHOLHDL, LDLDIRECT in the last 72 hours. Thyroid function studies: No results for input(s): TSH, T4TOTAL, T3FREE, THYROIDAB in the last 72 hours.  Invalid input(s):  FREET3 Anemia work up: No results for input(s): VITAMINB12, FOLATE, FERRITIN, TIBC, IRON, RETICCTPCT in the last 72 hours. Sepsis Labs: Recent Labs  Lab 07/06/20 0436 07/07/20 0400 07/08/20 0300 07/09/20 0528  WBC 14.3* 13.7* 13.2* 12.3*    Microbiology No results found for this or any previous visit (from the past 240 hour(s)).  Procedures and diagnostic studies:  No results found.             LOS: 19 days   [place] Copywriter, advertising on [email or web address]. If 7PM-7AM, please contact night-coverage at [email or web address]     07/09/2020, 3:52 PM

## 2020-07-09 NOTE — Plan of Care (Signed)

## 2020-07-09 NOTE — Progress Notes (Signed)
Received call that patient's o2 sat is 78. I arrived on floor to see per computer saturation is 95.  I went in to see patient. He is alone. No changes made to o2. Remains on 6 liters, alert and talking. Per bedside monitor, saturation 97%. No changes made at this time.

## 2020-07-10 ENCOUNTER — Inpatient Hospital Stay: Payer: Medicare HMO

## 2020-07-10 ENCOUNTER — Encounter: Payer: Self-pay | Admitting: Internal Medicine

## 2020-07-10 DIAGNOSIS — J1282 Pneumonia due to coronavirus disease 2019: Secondary | ICD-10-CM | POA: Diagnosis not present

## 2020-07-10 DIAGNOSIS — U071 COVID-19: Secondary | ICD-10-CM | POA: Diagnosis not present

## 2020-07-10 DIAGNOSIS — N1831 Chronic kidney disease, stage 3a: Secondary | ICD-10-CM | POA: Diagnosis not present

## 2020-07-10 DIAGNOSIS — N170 Acute kidney failure with tubular necrosis: Secondary | ICD-10-CM | POA: Diagnosis not present

## 2020-07-10 LAB — CBC WITH DIFFERENTIAL/PLATELET
Abs Immature Granulocytes: 0.12 10*3/uL — ABNORMAL HIGH (ref 0.00–0.07)
Basophils Absolute: 0.1 10*3/uL (ref 0.0–0.1)
Basophils Relative: 0 %
Eosinophils Absolute: 0.5 10*3/uL (ref 0.0–0.5)
Eosinophils Relative: 4 %
HCT: 38.2 % — ABNORMAL LOW (ref 39.0–52.0)
Hemoglobin: 12 g/dL — ABNORMAL LOW (ref 13.0–17.0)
Immature Granulocytes: 1 %
Lymphocytes Relative: 8 %
Lymphs Abs: 0.9 10*3/uL (ref 0.7–4.0)
MCH: 28.8 pg (ref 26.0–34.0)
MCHC: 31.4 g/dL (ref 30.0–36.0)
MCV: 91.8 fL (ref 80.0–100.0)
Monocytes Absolute: 1 10*3/uL (ref 0.1–1.0)
Monocytes Relative: 9 %
Neutro Abs: 9 10*3/uL — ABNORMAL HIGH (ref 1.7–7.7)
Neutrophils Relative %: 78 %
Platelets: 237 10*3/uL (ref 150–400)
RBC: 4.16 MIL/uL — ABNORMAL LOW (ref 4.22–5.81)
RDW: 13.9 % (ref 11.5–15.5)
WBC: 11.5 10*3/uL — ABNORMAL HIGH (ref 4.0–10.5)
nRBC: 0 % (ref 0.0–0.2)

## 2020-07-10 LAB — PROCALCITONIN: Procalcitonin: <0.1 ng/mL

## 2020-07-10 LAB — RENAL FUNCTION PANEL
Albumin: 2.7 g/dL — ABNORMAL LOW (ref 3.5–5.0)
Anion gap: 9 (ref 5–15)
BUN: 23 mg/dL (ref 8–23)
CO2: 30 mmol/L (ref 22–32)
Calcium: 8.6 mg/dL — ABNORMAL LOW (ref 8.9–10.3)
Chloride: 95 mmol/L — ABNORMAL LOW (ref 98–111)
Creatinine, Ser: 1.29 mg/dL — ABNORMAL HIGH (ref 0.61–1.24)
GFR, Estimated: 56 mL/min — ABNORMAL LOW (ref >60)
Glucose, Bld: 202 mg/dL — ABNORMAL HIGH (ref 70–99)
Phosphorus: 3.5 mg/dL (ref 2.5–4.6)
Potassium: 4.9 mmol/L (ref 3.5–5.1)
Sodium: 134 mmol/L — ABNORMAL LOW (ref 135–145)

## 2020-07-10 LAB — GLUCOSE, CAPILLARY
Glucose-Capillary: 196 mg/dL — ABNORMAL HIGH (ref 70–99)
Glucose-Capillary: 292 mg/dL — ABNORMAL HIGH (ref 70–99)
Glucose-Capillary: 229 mg/dL — ABNORMAL HIGH (ref 70–99)
Glucose-Capillary: 160 mg/dL — ABNORMAL HIGH (ref 70–99)

## 2020-07-10 LAB — BRAIN NATRIURETIC PEPTIDE: B Natriuretic Peptide: 94.5 pg/mL (ref 0.0–100.0)

## 2020-07-10 LAB — MAGNESIUM: Magnesium: 2.2 mg/dL (ref 1.7–2.4)

## 2020-07-10 MED ORDER — IOHEXOL 350 MG/ML SOLN: 75.0000 mL | Freq: Once | INTRAVENOUS | Status: DC | PRN

## 2020-07-10 MED ORDER — METHYLPREDNISOLONE SODIUM SUCC 40 MG IJ SOLR
40.0000 mg | Freq: Three times a day (TID) | INTRAMUSCULAR | Status: DC
  Administered 2020-07-10 – 2020-07-12 (×5): 40 mg via INTRAVENOUS
  Filled 2020-07-10 (×4): qty 1

## 2020-07-10 MED ORDER — IOHEXOL 350 MG/ML SOLN
100.0000 mL | Freq: Once | INTRAVENOUS | Status: AC | PRN
  Administered 2020-07-10: 100 mL via INTRAVENOUS

## 2020-07-10 MED ORDER — DOXYCYCLINE HYCLATE 100 MG PO TABS
100.0000 mg | ORAL_TABLET | Freq: Two times a day (BID) | ORAL | Status: DC
  Administered 2020-07-10 – 2020-07-12 (×4): 100 mg via ORAL
  Filled 2020-07-10 (×4): qty 1

## 2020-07-10 NOTE — TOC Progression Note (Signed)
Transition of Care (TOC) - Progression Note    Patient Details  Name: Scott Lawson. MRN: [id number] Date of Birth: Apr 17, 1941  Transition of Care [facility]) CM/SW Contact  Eileen Stanford, LCSW Phone Number: 07/10/2020, 10:45 AM  Clinical Narrative:   Provided bed offers, choice is [facility]. CSW has requested that [place] start auth. Pt is managed by regular [employer].    Expected Discharge Plan: [place] Barriers to Discharge: Continued Medical Work up  Expected Discharge Plan and Services Expected Discharge Plan: [place] In-house Referral: Clinical Social Work   Post Acute Care Choice: [place] Living arrangements for the past 2 months: Single Family Home                                       Social Determinants of Health (SDOH) Interventions    Readmission Risk Interventions No flowsheet data found.

## 2020-07-10 NOTE — Progress Notes (Signed)
PT Cancellation Note  Patient Details Name: Scott Lawson. MRN: [id number] DOB: 29-May-1941   Cancelled Treatment:     PT session held today, pt had increased O2 needs last night and again today. Currently awaiting angiogram to r/o possible PE.    Josie Dixon 07/10/2020, 5:05 PM

## 2020-07-10 NOTE — Progress Notes (Signed)
CCMD reported a 7 beat run of v tach.  Pt asymptomatic.  Attempted IV in sertion.  IV team consulted.

## 2020-07-10 NOTE — Progress Notes (Signed)
Pt now on hiflo at 10 liters with O2 sat of 90%.

## 2020-07-10 NOTE — Progress Notes (Addendum)
PROGRESS NOTE    Scott Lawson.  YW:[id number] DOB: August 16, 1940 DOA: 06/20/2020 PCP: Venita Lick, NP   Chief complaint.  Shortness of breath. Brief Narrative:  Scott Lawson. is a 80 y.o. male with medical history significant for hypertension, type II DM, BPH, hyperlipidemia, obesity, CAD, who presented to the hospital with generalized weakness, lightheadedness and increasing shortness of breath.  He was admitted to the hospital for [id number] pneumonia complicated by acute hypoxic respiratory failure.  He is unvaccinated.  He was treated with steroids, remdesivir and baricitinib.  He was treated with oxygen including oxygen via heated humidified high flow nasal cannula.  He had hypoglycemic episodes requiring adjustments in hypoglycemic regimen.  He developed a superficial wound on the left forearm.  He attributed this to infiltration of peripheral IV cannula.  Orthopedic surgery was consulted to evaluate the wound but there is no indication for surgery.  He was started on doxycycline for probable wound infection.  1/26.  Suddenly worsening oxygenation last night, requiring 15 L oxygen.  Obtain CT angiogram to rule out PE.   Assessment & Plan:   Principal Problem:   Pneumonia due to COVID-19 virus Active Problems:   Acute renal failure superimposed on stage 3a chronic kidney disease (HCC)   Hyperglycemia due to type 2 diabetes mellitus (HCC)   Generalized weakness   Acute respiratory failure due to COVID-19 (HCC)   Obesity (BMI 30-39.9)  #1.  Acute hypoxemic respiratory failure secondary to COVID-19 pneumonia. COVID-19 pneumonia. Patient has completed remdesivir, baricitinib and steroids. Patient had a sudden onset of hypoxemia overnight, initially on 15 L oxygen, currently on 10 L oxygen.  BNP normal, no elevation procalcitonin level. Consider possibility of PE.  Will obtain CT angiogram. Continue symptomatic treatment, continue with oxygen. Reviewed  echocardiogram performed on 1/9, ejection fraction 60 to 69%.  No significant valvular abnormality. Reviewed chest x-ray performed on 1/10, personally reviewed the images.  Patient had severe bilateral pulmonary infiltrates, more severe on the right side.   2.  Acute renal failure on chronic kidney disease stage IIIa. Renal function still stable.  3.  Type 2 diabetes. Continue current regimen.  4.  Uncontrolled type 2 diabetes with hyperglycemia. Continue scheduled Levemir, NovoLog and sliding scale insulin.  1723. Reviewed CTA chest, no PE, but bilateral airsplace diseases much worse. Will restart solu-medrol.   DVT prophylaxis: Lovenox Code Status: Full Family Communication: daughter updated Disposition Plan:  .   Status is: Inpatient  Remains inpatient appropriate because:Inpatient level of care appropriate due to severity of illness   Dispo: The patient is from: Home              Anticipated d/c is to: SNF              Anticipated d/c date is: 2 days              Patient currently is not medically stable to d/c.   Difficult to place patient Yes        I/O last 3 completed shifts: In: 600 [P.O.:600] Out: 1125 [Urine:1125] No intake/output data recorded.     Consultants:   Orhopedics  Procedures: None  Antimicrobials:  Doxycycline  Subjective: Patient developed secondary hypoxemia last night while on 6 L oxygen.  Oxygen requirement increased to 15 L.  Currently on 10 L.  Patient states that he did not feel worse with breathing.  He has no cough.  He did not have any chest pain. He does  not have worsening leg edema. Denies any abdominal pain or nausea vomiting. No fever or chills. No dysuria hematuria.  Objective: Vitals:   07/10/20 0613 07/10/20 0619 07/10/20 0812 07/10/20 0815  BP:   95/61   Pulse:   91   Resp:   20   Temp:   98.3 F (36.8 C)   TempSrc:   Oral   SpO2: (!) 80% 90% 96% 92%  Weight:      Height:        Intake/Output Summary  (Last 24 hours) at 07/10/2020 1046 Last data filed at 07/10/2020 0600 Gross per 24 hour  Intake 480 ml  Output 1125 ml  Net -645 ml   Filed Weights   07/07/20 0300 07/08/20 0300 07/10/20 0357  Weight: 90 kg 91.9 kg 91.9 kg    Examination:  General exam: Appears calm and comfortable  Respiratory system: Clear to auscultation. Respiratory effort normal. Cardiovascular system: S1 & S2 heard, RRR. No JVD, murmurs, rubs, gallops or clicks. No pedal edema. Gastrointestinal system: Abdomen is nondistended, soft and nontender. No organomegaly or masses felt. Normal bowel sounds heard. Central nervous system: Alert and oriented. No focal neurological deficits. Extremities: Symmetric 5 x 5 power. Skin: No rashes, lesions or ulcers Psychiatry: Judgement and insight appear normal. Mood & affect appropriate.     Data Reviewed: I have personally reviewed following labs and imaging studies  CBC: Recent Labs  Lab 07/06/20 0436 07/07/20 0400 07/08/20 0300 07/09/20 0528 07/10/20 0657  WBC 14.3* 13.7* 13.2* 12.3* 11.5*  NEUTROABS 11.3* 11.0* 10.9* 9.6* 9.0*  HGB 13.4 12.5* 12.6* 12.0* 12.0*  HCT 40.3 39.1 38.0* 37.6* 38.2*  MCV 89.6 90.7 89.8 91.3 91.8  PLT 251 264 255 216 401   Basic Metabolic Panel: Recent Labs  Lab 07/06/20 0436 07/07/20 0400 07/08/20 0300 07/09/20 0528 07/10/20 0657  NA 136 135 133* 134* 134*  K 4.9 4.8 5.0 4.6 4.9  CL 97* 96* 94* 94* 95*  CO2 26 30 28 29 30   GLUCOSE 92 201* 269* 161* 202*  BUN 31* 32* 29* 22 23  CREATININE 1.25* 1.34* 1.36* 1.22 1.29*  CALCIUM 8.5* 8.2* 8.2* 8.3* 8.6*  MG 2.1 2.0 2.2 2.2 2.2  PHOS 4.4 3.3 2.5 3.4 3.5   GFR: Estimated Creatinine Clearance: 52 mL/min (A) (by C-G formula based on SCr of 1.29 mg/dL (H)). Liver Function Tests: Recent Labs  Lab 07/06/20 0436 07/07/20 0400 07/08/20 0300 07/09/20 0528 07/10/20 0657  ALBUMIN 2.3* 2.5* 2.5* 2.6* 2.7*   No results for input(s): LIPASE, AMYLASE in the last 168 hours. No  results for input(s): AMMONIA in the last 168 hours. Coagulation Profile: No results for input(s): INR, PROTIME in the last 168 hours. Cardiac Enzymes: No results for input(s): CKTOTAL, CKMB, CKMBINDEX, TROPONINI in the last 168 hours. BNP (last 3 results) No results for input(s): PROBNP in the last 8760 hours. HbA1C: No results for input(s): HGBA1C in the last 72 hours. CBG: Recent Labs  Lab 07/09/20 0757 07/09/20 1139 07/09/20 1630 07/09/20 1927 07/10/20 0810  GLUCAP 178* 212* 212* 238* 196*   Lipid Profile: No results for input(s): CHOL, HDL, LDLCALC, TRIG, CHOLHDL, LDLDIRECT in the last 72 hours. Thyroid Function Tests: No results for input(s): TSH, T4TOTAL, FREET4, T3FREE, THYROIDAB in the last 72 hours. Anemia Panel: No results for input(s): VITAMINB12, FOLATE, FERRITIN, TIBC, IRON, RETICCTPCT in the last 72 hours. Sepsis Labs: Recent Labs  Lab 07/10/20 0657  PROCALCITON <0.10    No results found for this  or any previous visit (from the past 240 hour(s)).       Radiology Studies: No results found.      Scheduled Meds: . albuterol  2 puff Inhalation Q6H  . vitamin C  500 mg Oral Daily  . aspirin EC  81 mg Oral Daily  . atorvastatin  80 mg Oral Daily  . Chlorhexidine Gluconate Cloth  6 each Topical Daily  . enoxaparin (LOVENOX) injection  40 mg Subcutaneous Q24H  . insulin aspart  0-20 Units Subcutaneous TID AC  . insulin aspart  0-5 Units Subcutaneous QHS  . insulin aspart  6 Units Subcutaneous TID WC  . insulin detemir  6 Units Subcutaneous BID  . linagliptin  5 mg Oral Daily  . polyethylene glycol  17 g Oral Daily  . senna-docusate  1 tablet Oral BID  . tamsulosin  0.4 mg Oral Daily  . zinc sulfate  220 mg Oral Daily   Continuous Infusions:   LOS: 20 days    Time spent: 35 minutes    Sharen Hones, MD [facility]   To contact the attending provider between 7A-7P or the covering provider during after hours 7P-7A, please log into  the web site [email or web address] and access using universal [place] password for that web site. If you do not have the password, please call the hospital operator.  07/10/2020, 10:46 AM

## 2020-07-10 NOTE — Progress Notes (Signed)
O2 sat 78% on 6 liters hiflo. Pt repositioned high in bed.  Pt instructed to take deep breaths.  Nose cleansed with saline solution and pt cleaned out his nose.  O2 sat still 79-80%.  O2 increased to 7 liters hiflo.  After several minutes still no change.  Called RT.  Placed NRB mask on at 15 liters, O2 sats went up to 97%.  RT notified when she came on unit.  RT in with pt.

## 2020-07-11 DIAGNOSIS — J1282 Pneumonia due to coronavirus disease 2019: Secondary | ICD-10-CM | POA: Diagnosis not present

## 2020-07-11 DIAGNOSIS — E1165 Type 2 diabetes mellitus with hyperglycemia: Secondary | ICD-10-CM | POA: Diagnosis not present

## 2020-07-11 DIAGNOSIS — U071 COVID-19: Secondary | ICD-10-CM | POA: Diagnosis not present

## 2020-07-11 DIAGNOSIS — Z794 Long term (current) use of insulin: Secondary | ICD-10-CM

## 2020-07-11 DIAGNOSIS — N170 Acute kidney failure with tubular necrosis: Secondary | ICD-10-CM | POA: Diagnosis not present

## 2020-07-11 LAB — CBC WITH DIFFERENTIAL/PLATELET
Abs Immature Granulocytes: 0.07 10*3/uL (ref 0.00–0.07)
Basophils Absolute: 0 10*3/uL (ref 0.0–0.1)
Basophils Relative: 0 %
Eosinophils Absolute: 0 10*3/uL (ref 0.0–0.5)
Eosinophils Relative: 0 %
HCT: 35.7 % — ABNORMAL LOW (ref 39.0–52.0)
Hemoglobin: 11.4 g/dL — ABNORMAL LOW (ref 13.0–17.0)
Immature Granulocytes: 1 %
Lymphocytes Relative: 6 %
Lymphs Abs: 0.6 10*3/uL — ABNORMAL LOW (ref 0.7–4.0)
MCH: 29.3 pg (ref 26.0–34.0)
MCHC: 31.9 g/dL (ref 30.0–36.0)
MCV: 91.8 fL (ref 80.0–100.0)
Monocytes Absolute: 0.2 10*3/uL (ref 0.1–1.0)
Monocytes Relative: 2 %
Neutro Abs: 8.2 10*3/uL — ABNORMAL HIGH (ref 1.7–7.7)
Neutrophils Relative %: 91 %
Platelets: 234 10*3/uL (ref 150–400)
RBC: 3.89 MIL/uL — ABNORMAL LOW (ref 4.22–5.81)
RDW: 14.1 % (ref 11.5–15.5)
WBC: 9.1 10*3/uL (ref 4.0–10.5)
nRBC: 0 % (ref 0.0–0.2)

## 2020-07-11 LAB — GLUCOSE, CAPILLARY
Glucose-Capillary: 326 mg/dL — ABNORMAL HIGH (ref 70–99)
Glucose-Capillary: 412 mg/dL — ABNORMAL HIGH (ref 70–99)
Glucose-Capillary: 316 mg/dL — ABNORMAL HIGH (ref 70–99)
Glucose-Capillary: 292 mg/dL — ABNORMAL HIGH (ref 70–99)

## 2020-07-11 LAB — RENAL FUNCTION PANEL
Albumin: 2.6 g/dL — ABNORMAL LOW (ref 3.5–5.0)
Anion gap: 10 (ref 5–15)
BUN: 24 mg/dL — ABNORMAL HIGH (ref 8–23)
CO2: 28 mmol/L (ref 22–32)
Calcium: 8.5 mg/dL — ABNORMAL LOW (ref 8.9–10.3)
Chloride: 93 mmol/L — ABNORMAL LOW (ref 98–111)
Creatinine, Ser: 1.33 mg/dL — ABNORMAL HIGH (ref 0.61–1.24)
GFR, Estimated: 54 mL/min — ABNORMAL LOW (ref >60)
Glucose, Bld: 328 mg/dL — ABNORMAL HIGH (ref 70–99)
Phosphorus: 4.4 mg/dL (ref 2.5–4.6)
Potassium: 5.6 mmol/L — ABNORMAL HIGH (ref 3.5–5.1)
Sodium: 131 mmol/L — ABNORMAL LOW (ref 135–145)

## 2020-07-11 LAB — MAGNESIUM: Magnesium: 2.2 mg/dL (ref 1.7–2.4)

## 2020-07-11 LAB — FIBRIN DERIVATIVES D-DIMER (ARMC ONLY): Fibrin derivatives D-dimer (ARMC): 2469.53 ng/mL (FEU) — ABNORMAL HIGH (ref 0.00–499.00)

## 2020-07-11 MED ORDER — SODIUM ZIRCONIUM CYCLOSILICATE 10 G PO PACK
10.0000 g | PACK | Freq: Once | ORAL | Status: AC
  Administered 2020-07-11: 10 g via ORAL
  Filled 2020-07-11: qty 1

## 2020-07-11 MED ORDER — INSULIN DETEMIR 100 UNIT/ML ~~LOC~~ SOLN
12.0000 [IU] | Freq: Two times a day (BID) | SUBCUTANEOUS | Status: DC
  Administered 2020-07-11 – 2020-07-13 (×4): 12 [IU] via SUBCUTANEOUS
  Filled 2020-07-11 (×5): qty 0.12

## 2020-07-11 NOTE — Progress Notes (Signed)
PROGRESS NOTE    Scott Bruins Abaya [name]  UYQ:[id number] DOB: 28-Apr-1941 DOA: 06/20/2020 PCP: Venita Lick, NP   Chief Complaint.  Shortness of breath Brief Narrative:  Scott Lawsonis a 80 y.o.malewith medical history significant for hypertension, type II DM, BPH, hyperlipidemia, obesity, CAD, who presented to the hospital with generalized weakness, lightheadedness and increasing shortness of breath.  He was admitted to the hospital for GLOVF-64 pneumonia complicated by acute hypoxic respiratory failure. He is unvaccinated. He was treated with steroids, remdesivir and baricitinib. He was treated with oxygen including oxygen via heated humidified high flow nasal cannula. He had hypoglycemic episodes requiring adjustments in hypoglycemic regimen.  He developed a superficial wound on the left forearm. He attributed this to infiltration of peripheral IV cannula. Orthopedic surgery was consulted to evaluate the wound but there is no indication for surgery. He was started on doxycycline for probable wound infection.  1/26.  Suddenly worsening oxygenation last night, requiring 15 L oxygen.  CT angiogram of the chest showed a severe bilateral airspace disease, worse than previous imaging study.  Solu-Medrol restarted.   Assessment & Plan:   Principal Problem:   Pneumonia due to COVID-19 virus Active Problems:   Acute renal failure superimposed on stage 3a chronic kidney disease (HCC)   Hyperglycemia due to type 2 diabetes mellitus (HCC)   Generalized weakness   Acute respiratory failure due to COVID-19 (HCC)   Obesity (BMI 30-39.9)  #1.  Acute hypoxemic respiratory failure secondary to COVID-19 pneumonia. COVID-19 pneumonia. Patient has completed all treatment for Covid.  However, he had a worsening short of breath, CT angiogram showed worsening bilateral airspace diseases.  It could be later complication from Covid.  Does not have volume overload, no secondary bacterial  pneumonia.  No PE. Steroids were is restarted. Patient prognosis is unknown at this time.  There is a risk for deterioration.  2.  Acute kidney injury on chronic kidney disease stage IIIa. Hyponatremia. Hyperkalemia. Renal function appears to be stable at this time.  Continue to follow BMP.  Give Lokelma 10 g today recheck level tomorrow.  3.  Uncontrolled type 2 diabetes with hyperglycemia. Increase scheduled Levemir, continue sliding scale insulin and scheduled NovoLog.    DVT prophylaxis: scds. lovenox discontinued due to nose bleed Code Status: Full Family Communication: daughter updated Disposition Plan:  .   Status is: Inpatient  Remains inpatient appropriate because:Inpatient level of care appropriate due to severity of illness   Dispo: The patient is from: Home              Anticipated d/c is to: Home              Anticipated d/c date is: > 3 days              Patient currently is not medically stable to d/c.   Difficult to place patient No        I/O last 3 completed shifts: In: 840 [P.O.:840] Out: 1600 [Urine:1600] Total I/O In: -  Out: 400 [Urine:400]     Consultants:   None  Procedures: None  Antimicrobials: None  Subjective: Patient currently on 8 L oxygen, he feels better.  But still has short of breath with exertion.  He has some nasal bleeding today. Denies any abdominal pain nausea vomiting. No fever chills, No dysuria hematuria.  Objective: Vitals:   07/10/20 2005 07/11/20 0400 07/11/20 0821 07/11/20 0840  BP: 114/71 116/68 111/69   Pulse: (!) 55 77  Resp: 19 (!) 23    Temp: 97.9 F (36.6 C) 98.3 F (36.8 C) 98.2 F (36.8 C)   TempSrc:  Oral Oral   SpO2: 95% 97% 97% 97%  Weight:  91 kg    Height:        Intake/Output Summary (Last 24 hours) at 07/11/2020 1042 Last data filed at 07/11/2020 [id number] Gross per 24 hour  Intake 360 ml  Output 1600 ml  Net -1240 ml   Filed Weights   07/08/20 0300 07/10/20 0357 07/11/20 0400   Weight: 91.9 kg 91.9 kg 91 kg    Examination:  General exam: Appears calm and comfortable  Respiratory system: Decreased breathing sounds. Respiratory effort normal. Cardiovascular system: S1 & S2 heard, RRR. No JVD, murmurs, rubs, gallops or clicks. No pedal edema. Gastrointestinal system: Abdomen is nondistended, soft and nontender. No organomegaly or masses felt. Normal bowel sounds heard. Central nervous system: Alert and oriented. No focal neurological deficits. Extremities: Symmetric  Skin: No rashes, lesions or ulcers Psychiatry:  Mood & affect appropriate.     Data Reviewed: I have personally reviewed following labs and imaging studies  CBC: Recent Labs  Lab 07/07/20 0400 07/08/20 0300 07/09/20 0528 07/10/20 0657 07/11/20 0623  WBC 13.7* 13.2* 12.3* 11.5* 9.1  NEUTROABS 11.0* 10.9* 9.6* 9.0* 8.2*  HGB 12.5* 12.6* 12.0* 12.0* 11.4*  HCT 39.1 38.0* 37.6* 38.2* 35.7*  MCV 90.7 89.8 91.3 91.8 91.8  PLT 264 255 216 237 [id number]   Basic Metabolic Panel: Recent Labs  Lab 07/07/20 0400 07/08/20 0300 07/09/20 0528 07/10/20 0657 07/11/20 0623  NA 135 133* 134* 134* 131*  K 4.8 5.0 4.6 4.9 5.6*  CL 96* 94* 94* 95* 93*  CO2 30 28 29 30 28   GLUCOSE 201* 269* 161* 202* 328*  BUN 32* 29* 22 23 24*  CREATININE 1.34* 1.36* 1.22 1.29* 1.33*  CALCIUM 8.2* 8.2* 8.3* 8.6* 8.5*  MG 2.0 2.2 2.2 2.2 2.2  PHOS 3.3 2.5 3.4 3.5 4.4   GFR: Estimated Creatinine Clearance: 50.2 mL/min (A) (by C-G formula based on SCr of 1.33 mg/dL (H)). Liver Function Tests: Recent Labs  Lab 07/07/20 0400 07/08/20 0300 07/09/20 0528 07/10/20 0657 07/11/20 0623  ALBUMIN 2.5* 2.5* 2.6* 2.7* 2.6*   No results for input(s): LIPASE, AMYLASE in the last 168 hours. No results for input(s): AMMONIA in the last 168 hours. Coagulation Profile: No results for input(s): INR, PROTIME in the last 168 hours. Cardiac Enzymes: No results for input(s): CKTOTAL, CKMB, CKMBINDEX, TROPONINI in the last 168  hours. BNP (last 3 results) No results for input(s): PROBNP in the last 8760 hours. HbA1C: No results for input(s): HGBA1C in the last 72 hours. CBG: Recent Labs  Lab 07/10/20 0810 07/10/20 1054 07/10/20 1653 07/10/20 2006 07/11/20 0746  GLUCAP 196* 292* 229* 160* 326*   Lipid Profile: No results for input(s): CHOL, HDL, LDLCALC, TRIG, CHOLHDL, LDLDIRECT in the last 72 hours. Thyroid Function Tests: No results for input(s): TSH, T4TOTAL, FREET4, T3FREE, THYROIDAB in the last 72 hours. Anemia Panel: No results for input(s): VITAMINB12, FOLATE, FERRITIN, TIBC, IRON, RETICCTPCT in the last 72 hours. Sepsis Labs: Recent Labs  Lab 07/10/20 0657  PROCALCITON <0.10    No results found for this or any previous visit (from the past 240 hour(s)).       Radiology Studies: CT ANGIO CHEST PE W OR WO CONTRAST  Result Date: 07/10/2020 CLINICAL DATA:  Generalized weakness, lightheadedness, increasing shortness of breath, history of COVID-19 EXAM: CT ANGIOGRAPHY  CHEST WITH CONTRAST TECHNIQUE: Multidetector CT imaging of the chest was performed using the standard protocol during bolus administration of intravenous contrast. Multiplanar CT image reconstructions and MIPs were obtained to evaluate the vascular anatomy. CONTRAST:  160mL OMNIPAQUE IOHEXOL 350 MG/ML SOLN COMPARISON:  06/24/2020 FINDINGS: Cardiovascular: This is a technically adequate evaluation of the pulmonary vasculature. No filling defects or pulmonary emboli. The heart is enlarged without pericardial effusion. Extensive atherosclerosis of the thoracic aorta without aneurysm or dissection. Mediastinum/Nodes: No enlarged mediastinal, hilar, or axillary lymph nodes. Thyroid gland, trachea, and esophagus demonstrate no significant findings. Small hiatal hernia. Lungs/Pleura: There has been progression of the interstitial and ground-glass opacity seen previously, consistent with worsening infection or edema. There is mild diffuse  bronchiectasis, with areas of bibasilar scarring. No effusion or pneumothorax. Central airways are patent. Upper Abdomen: No acute abnormality. Musculoskeletal: No acute or destructive bony lesions. Reconstructed images demonstrate no additional findings. Review of the MIP images confirms the above findings. IMPRESSION: 1. No evidence of pulmonary embolus. 2. Progressive interstitial and ground-glass opacities throughout the lungs, most consistent with worsening COVID-19 pneumonia. 3. Aortic Atherosclerosis (ICD10-I70.0) and Emphysema (ICD10-J43.9). Electronically Signed   By: Randa Ngo M.D.   On: 07/10/2020 16:26        Scheduled Meds: . albuterol  2 puff Inhalation Q6H  . vitamin C  500 mg Oral Daily  . aspirin EC  81 mg Oral Daily  . atorvastatin  80 mg Oral Daily  . Chlorhexidine Gluconate Cloth  6 each Topical Daily  . doxycycline  100 mg Oral Q12H  . insulin aspart  0-20 Units Subcutaneous TID AC  . insulin aspart  0-5 Units Subcutaneous QHS  . insulin aspart  6 Units Subcutaneous TID WC  . insulin detemir  6 Units Subcutaneous BID  . linagliptin  5 mg Oral Daily  . methylPREDNISolone (SOLU-MEDROL) injection  40 mg Intravenous Q8H  . polyethylene glycol  17 g Oral Daily  . senna-docusate  1 tablet Oral BID  . tamsulosin  0.4 mg Oral Daily  . zinc sulfate  220 mg Oral Daily   Continuous Infusions:   LOS: 21 days    Time spent: 35 minutes.  More than 50% time spent on direct patient care.    Sharen Hones, MD [facility]   To contact the attending provider between 7A-7P or the covering provider during after hours 7P-7A, please log into the web site [email or web address] and access using universal [place] password for that web site. If you do not have the password, please call the hospital operator.  07/11/2020, 10:42 AM

## 2020-07-11 NOTE — Progress Notes (Signed)
Inpatient Diabetes Program Recommendations  AACE/ADA: New Consensus Statement on Inpatient Glycemic Control   Target Ranges:  Prepandial:   less than 140 mg/dL      Peak postprandial:   less than 180 mg/dL (1-2 hours)      Critically ill patients:  140 - 180 mg/dL  Results for LONNEY, REVAK (MRN [id number]) as of 07/11/2020 08:29  Ref. Range 07/10/2020 08:10 07/10/2020 10:54 07/10/2020 16:53 07/10/2020 20:06 07/11/2020 07:46  Glucose-Capillary Latest Ref Range: 70 - 99 mg/dL 196 (H) 292 (H) 229 (H) 160 (H) 326 (H)    Review of Glycemic Control  Current orders for Inpatient glycemic control: Levemir 6 units BID, Novolog 6 units TID with meals, Novolog 0-20 units TID with meals, Novolog 0-5 units QHS, Tradjenta 5 mg daily; Solumedrol 40 mg Q8H  Inpatient Diabetes Program Recommendations:    Insulin: If steroids are continued, please consider increasing Levemir to 10 units BID and meal coverage to Novolog 12 units TID with meals if patient eats at least 50% of meals.  Thanks, Barnie Alderman, RN, MSN, CDE Diabetes Coordinator Inpatient Diabetes Program [phone] (Team Pager from 8am to 5pm)

## 2020-07-11 NOTE — Progress Notes (Signed)
PT Cancellation Note  Patient Details Name: Scott Lawson. MRN: [id number] DOB: 24-Nov-1940   Cancelled Treatment:     PT attempt. PT hold. Pt has elevated K+ and BS over 300. Will hold PT today but continue to follow and progress as able per current  POC.   Willette Pa 07/11/2020, 10:03 AM

## 2020-07-12 DIAGNOSIS — U071 COVID-19: Secondary | ICD-10-CM | POA: Diagnosis not present

## 2020-07-12 DIAGNOSIS — J1282 Pneumonia due to coronavirus disease 2019: Secondary | ICD-10-CM | POA: Diagnosis not present

## 2020-07-12 DIAGNOSIS — N1831 Chronic kidney disease, stage 3a: Secondary | ICD-10-CM | POA: Diagnosis not present

## 2020-07-12 DIAGNOSIS — N170 Acute kidney failure with tubular necrosis: Secondary | ICD-10-CM | POA: Diagnosis not present

## 2020-07-12 LAB — CBC WITH DIFFERENTIAL/PLATELET
Abs Immature Granulocytes: 0.11 10*3/uL — ABNORMAL HIGH (ref 0.00–0.07)
Basophils Absolute: 0 10*3/uL (ref 0.0–0.1)
Basophils Relative: 0 %
Eosinophils Absolute: 0 10*3/uL (ref 0.0–0.5)
Eosinophils Relative: 0 %
HCT: 36.4 % — ABNORMAL LOW (ref 39.0–52.0)
Hemoglobin: 11.7 g/dL — ABNORMAL LOW (ref 13.0–17.0)
Immature Granulocytes: 1 %
Lymphocytes Relative: 5 %
Lymphs Abs: 0.7 10*3/uL (ref 0.7–4.0)
MCH: 29.4 pg (ref 26.0–34.0)
MCHC: 32.1 g/dL (ref 30.0–36.0)
MCV: 91.5 fL (ref 80.0–100.0)
Monocytes Absolute: 0.7 10*3/uL (ref 0.1–1.0)
Monocytes Relative: 5 %
Neutro Abs: 13.6 10*3/uL — ABNORMAL HIGH (ref 1.7–7.7)
Neutrophils Relative %: 89 %
Platelets: 247 10*3/uL (ref 150–400)
RBC: 3.98 MIL/uL — ABNORMAL LOW (ref 4.22–5.81)
RDW: 13.8 % (ref 11.5–15.5)
WBC: 15.1 10*3/uL — ABNORMAL HIGH (ref 4.0–10.5)
nRBC: 0 % (ref 0.0–0.2)

## 2020-07-12 LAB — RENAL FUNCTION PANEL
Albumin: 2.6 g/dL — ABNORMAL LOW (ref 3.5–5.0)
Anion gap: 9 (ref 5–15)
BUN: 31 mg/dL — ABNORMAL HIGH (ref 8–23)
CO2: 30 mmol/L (ref 22–32)
Calcium: 8.7 mg/dL — ABNORMAL LOW (ref 8.9–10.3)
Chloride: 96 mmol/L — ABNORMAL LOW (ref 98–111)
Creatinine, Ser: 1.33 mg/dL — ABNORMAL HIGH (ref 0.61–1.24)
GFR, Estimated: 54 mL/min — ABNORMAL LOW (ref >60)
Glucose, Bld: 283 mg/dL — ABNORMAL HIGH (ref 70–99)
Phosphorus: 3.8 mg/dL (ref 2.5–4.6)
Potassium: 5.6 mmol/L — ABNORMAL HIGH (ref 3.5–5.1)
Sodium: 135 mmol/L (ref 135–145)

## 2020-07-12 LAB — GLUCOSE, CAPILLARY
Glucose-Capillary: 282 mg/dL — ABNORMAL HIGH (ref 70–99)
Glucose-Capillary: 413 mg/dL — ABNORMAL HIGH (ref 70–99)
Glucose-Capillary: 367 mg/dL — ABNORMAL HIGH (ref 70–99)
Glucose-Capillary: 294 mg/dL — ABNORMAL HIGH (ref 70–99)

## 2020-07-12 LAB — MAGNESIUM: Magnesium: 2.4 mg/dL (ref 1.7–2.4)

## 2020-07-12 MED ORDER — INSULIN ASPART 100 UNIT/ML ~~LOC~~ SOLN
20.0000 [IU] | Freq: Once | SUBCUTANEOUS | Status: AC
  Administered 2020-07-12: 10 [IU] via SUBCUTANEOUS

## 2020-07-12 MED ORDER — DOXYCYCLINE HYCLATE 100 MG PO TABS
100.0000 mg | ORAL_TABLET | Freq: Two times a day (BID) | ORAL | Status: AC
  Administered 2020-07-12 – 2020-07-13 (×3): 100 mg via ORAL
  Filled 2020-07-12 (×3): qty 1

## 2020-07-12 MED ORDER — PREDNISONE 20 MG PO TABS
20.0000 mg | ORAL_TABLET | Freq: Every day | ORAL | Status: DC
  Administered 2020-07-13 – 2020-07-17 (×5): 20 mg via ORAL
  Filled 2020-07-12 (×5): qty 1

## 2020-07-12 MED ORDER — SODIUM POLYSTYRENE SULFONATE 15 GM/60ML PO SUSP
45.0000 g | Freq: Once | ORAL | Status: AC
  Administered 2020-07-12: 45 g via ORAL
  Filled 2020-07-12: qty 180

## 2020-07-12 NOTE — Progress Notes (Signed)
PT Cancellation Note  Patient Details Name: Scott Lawson. MRN: [id number] DOB: 05-Sep-1940   Cancelled Treatment:    Reason Eval/Treat Not Completed: Medical issues which prohibited therapy Pt noted to still have elevated K+ levels (5.6) and exertional activity contraindicated at this time. Will f/u as able & as pt is medically stable.  Lavone Nian, PT, DPT 07/12/20, 2:42 PM    [name] 07/12/2020, 2:41 PM

## 2020-07-12 NOTE — Progress Notes (Signed)
PROGRESS NOTE    Princess Bruins Casella [name]  [id number] DOB: [birth year] DOA: 06/20/2020 PCP: Venita Lick, NP   Chief complaint.  Shortness of breath. Brief Narrative:  Princess Bruins Baccari Jr.is a 80 y.o.malewith medical history significant for hypertension, type II DM, BPH, hyperlipidemia, obesity, CAD, who presented to the hospital with generalized weakness, lightheadedness and increasing shortness of breath.  He was admitted to the hospital for [id number] pneumonia complicated by acute hypoxic respiratory failure. He is unvaccinated. He was treated with steroids, remdesivir and baricitinib. He was treated with oxygen including oxygen via heated humidified high flow nasal cannula. He had hypoglycemic episodes requiring adjustments in hypoglycemic regimen.  He developed a superficial wound on the left forearm. He attributed this to infiltration of peripheral IV cannula. Orthopedic surgery was consulted to evaluate the wound but there is no indication for surgery. He was started on doxycycline for probable wound infection.  1/26.Suddenly worsening oxygenation last night, requiring 15 L oxygen.  CT angiogram of the chest showed a severe bilateral airspace disease, worse than previous imaging study.  Solu-Medrol restarted.   Assessment & Plan:   Principal Problem:   Pneumonia due to COVID-19 virus Active Problems:   Acute renal failure superimposed on stage 3a chronic kidney disease (HCC)   Hyperglycemia due to type 2 diabetes mellitus (HCC)   Generalized weakness   Acute respiratory failure due to COVID-19 (HCC)   Obesity (BMI 30-39.9)  #1.  Acute hypoxemic respiratory failure secondary to COVID-19 pneumonia. COVID-19 pneumonia Patient had a worsening bilateral airspace disease on CT scan recently.  No evidence of PE, volume overload or bacterial pneumonia. It could be pulmonary fibrosis. Currently, patient is on 8 L oxygen. I will continue steroids with reduced to  prednisone 20 mg daily.  2.  Uncontrolled type 2 diabetes with hyperglycemia. Glucose running high. Steroid dose decreased.  We will give 1 dose of NovoLog 20 units, continue current regimen.  3.  Acute kidney injury on chronic kidney disease stage IIIa. Hyponatremia Hyperkalemia. Potassium still elevated, give a higher dose of Kayexalate. Recheck a BMP tomorrow.  Renal function has been stable.      DVT prophylaxis: SCDs Code Status: Full Family Communication:  Disposition Plan:  .   Status is: Inpatient  Remains inpatient appropriate because:Inpatient level of care appropriate due to severity of illness   Dispo: The patient is from: Home              Anticipated d/c is to: SNF              Anticipated d/c date is: 3 days              Patient currently is not medically stable to d/c.   Difficult to place patient No        I/O last 3 completed shifts: In: -  Out: 1900 [Urine:1900] No intake/output data recorded.     Consultants:   None  Procedures: None  Antimicrobials:None  Subjective: Patient slept well last night.  He feels good, no signal short of breath today.  No cough. No abdominal pain or nausea vomiting. He had a normal bowel movement. No fever or chills.   Objective: Vitals:   07/12/20 0400 07/12/20 0500 07/12/20 0800 07/12/20 0815  BP: 124/62  120/61   Pulse: 81  (!) 58   Resp: (!) 21  20   Temp: 97.8 F (36.6 C)  98 F (36.7 C)   TempSrc: Oral  Oral   SpO2:  98% 97% 99% 95%  Weight:      Height:        Intake/Output Summary (Last 24 hours) at 07/12/2020 1224 Last data filed at 07/12/2020 0531 Gross per 24 hour  Intake -  Output 700 ml  Net -700 ml   Filed Weights   07/08/20 0300 07/10/20 0357 07/11/20 0400  Weight: 91.9 kg 91.9 kg 91 kg    Examination:  General exam: Appears calm and comfortable  Respiratory system: Decreased breathing sounds. Respiratory effort normal. Cardiovascular system: S1 & S2 heard, RRR. No  JVD, murmurs, rubs, gallops or clicks. No pedal edema. Gastrointestinal system: Abdomen is nondistended, soft and nontender. No organomegaly or masses felt. Normal bowel sounds heard. Central nervous system: Alert and oriented. No focal neurological deficits. Extremities: Symmetric 5 x 5 power. Skin: No rashes, lesions or ulcers Psychiatry: . Mood & affect appropriate.     Data Reviewed: I have personally reviewed following labs and imaging studies  CBC: Recent Labs  Lab 07/08/20 0300 07/09/20 0528 07/10/20 0657 07/11/20 0623 07/12/20 0700  WBC 13.2* 12.3* 11.5* 9.1 15.1*  NEUTROABS 10.9* 9.6* 9.0* 8.2* 13.6*  HGB 12.6* 12.0* 12.0* 11.4* 11.7*  HCT 38.0* 37.6* 38.2* 35.7* 36.4*  MCV 89.8 91.3 91.8 91.8 91.5  PLT 255 216 237 234 353   Basic Metabolic Panel: Recent Labs  Lab 07/08/20 0300 07/09/20 0528 07/10/20 0657 07/11/20 0623 07/12/20 0700  NA 133* 134* 134* 131* 135  K 5.0 4.6 4.9 5.6* 5.6*  CL 94* 94* 95* 93* 96*  CO2 28 29 30 28 30   GLUCOSE 269* 161* 202* 328* 283*  BUN 29* 22 23 24* 31*  CREATININE 1.36* 1.22 1.29* 1.33* 1.33*  CALCIUM 8.2* 8.3* 8.6* 8.5* 8.7*  MG 2.2 2.2 2.2 2.2 2.4  PHOS 2.5 3.4 3.5 4.4 3.8   GFR: Estimated Creatinine Clearance: 50.2 mL/min (A) (by C-G formula based on SCr of 1.33 mg/dL (H)). Liver Function Tests: Recent Labs  Lab 07/08/20 0300 07/09/20 0528 07/10/20 0657 07/11/20 0623 07/12/20 0700  ALBUMIN 2.5* 2.6* 2.7* 2.6* 2.6*   No results for input(s): LIPASE, AMYLASE in the last 168 hours. No results for input(s): AMMONIA in the last 168 hours. Coagulation Profile: No results for input(s): INR, PROTIME in the last 168 hours. Cardiac Enzymes: No results for input(s): CKTOTAL, CKMB, CKMBINDEX, TROPONINI in the last 168 hours. BNP (last 3 results) No results for input(s): PROBNP in the last 8760 hours. HbA1C: No results for input(s): HGBA1C in the last 72 hours. CBG: Recent Labs  Lab 07/11/20 1215 07/11/20 1629  07/11/20 2056 07/12/20 0839 07/12/20 1210  GLUCAP 412* 316* 292* 282* 413*   Lipid Profile: No results for input(s): CHOL, HDL, LDLCALC, TRIG, CHOLHDL, LDLDIRECT in the last 72 hours. Thyroid Function Tests: No results for input(s): TSH, T4TOTAL, FREET4, T3FREE, THYROIDAB in the last 72 hours. Anemia Panel: No results for input(s): VITAMINB12, FOLATE, FERRITIN, TIBC, IRON, RETICCTPCT in the last 72 hours. Sepsis Labs: Recent Labs  Lab 07/10/20 0657  PROCALCITON <0.10    No results found for this or any previous visit (from the past 240 hour(s)).       Radiology Studies: CT ANGIO CHEST PE W OR WO CONTRAST  Result Date: 07/10/2020 CLINICAL DATA:  Generalized weakness, lightheadedness, increasing shortness of breath, history of COVID-19 EXAM: CT ANGIOGRAPHY CHEST WITH CONTRAST TECHNIQUE: Multidetector CT imaging of the chest was performed using the standard protocol during bolus administration of intravenous contrast. Multiplanar CT image reconstructions and MIPs  were obtained to evaluate the vascular anatomy. CONTRAST:  144mL OMNIPAQUE IOHEXOL 350 MG/ML SOLN COMPARISON:  06/24/2020 FINDINGS: Cardiovascular: This is a technically adequate evaluation of the pulmonary vasculature. No filling defects or pulmonary emboli. The heart is enlarged without pericardial effusion. Extensive atherosclerosis of the thoracic aorta without aneurysm or dissection. Mediastinum/Nodes: No enlarged mediastinal, hilar, or axillary lymph nodes. Thyroid gland, trachea, and esophagus demonstrate no significant findings. Small hiatal hernia. Lungs/Pleura: There has been progression of the interstitial and ground-glass opacity seen previously, consistent with worsening infection or edema. There is mild diffuse bronchiectasis, with areas of bibasilar scarring. No effusion or pneumothorax. Central airways are patent. Upper Abdomen: No acute abnormality. Musculoskeletal: No acute or destructive bony lesions.  Reconstructed images demonstrate no additional findings. Review of the MIP images confirms the above findings. IMPRESSION: 1. No evidence of pulmonary embolus. 2. Progressive interstitial and ground-glass opacities throughout the lungs, most consistent with worsening COVID-19 pneumonia. 3. Aortic Atherosclerosis (ICD10-I70.0) and Emphysema (ICD10-J43.9). Electronically Signed   By: Randa Ngo M.D.   On: 07/10/2020 16:26        Scheduled Meds: . albuterol  2 puff Inhalation Q6H  . vitamin C  500 mg Oral Daily  . aspirin EC  81 mg Oral Daily  . atorvastatin  80 mg Oral Daily  . Chlorhexidine Gluconate Cloth  6 each Topical Daily  . doxycycline  100 mg Oral Q12H  . insulin aspart  0-20 Units Subcutaneous TID AC  . insulin aspart  0-5 Units Subcutaneous QHS  . insulin aspart  20 Units Subcutaneous Once  . insulin aspart  6 Units Subcutaneous TID WC  . insulin detemir  12 Units Subcutaneous BID  . linagliptin  5 mg Oral Daily  . polyethylene glycol  17 g Oral Daily  . [START ON 07/13/2020] predniSONE  20 mg Oral Q breakfast  . senna-docusate  1 tablet Oral BID  . tamsulosin  0.4 mg Oral Daily  . zinc sulfate  220 mg Oral Daily   Continuous Infusions:   LOS: 22 days    Time spent: 28 minutes    Sharen Hones, MD [facility]   To contact the attending provider between 7A-7P or the covering provider during after hours 7P-7A, please log into the web site [email or web address] and access using universal [place] password for that web site. If you do not have the password, please call the hospital operator.  07/12/2020, 12:24 PM

## 2020-07-13 DIAGNOSIS — N1831 Chronic kidney disease, stage 3a: Secondary | ICD-10-CM | POA: Diagnosis not present

## 2020-07-13 DIAGNOSIS — U071 COVID-19: Secondary | ICD-10-CM | POA: Diagnosis not present

## 2020-07-13 DIAGNOSIS — J1282 Pneumonia due to coronavirus disease 2019: Secondary | ICD-10-CM | POA: Diagnosis not present

## 2020-07-13 DIAGNOSIS — N170 Acute kidney failure with tubular necrosis: Secondary | ICD-10-CM | POA: Diagnosis not present

## 2020-07-13 LAB — RENAL FUNCTION PANEL
Albumin: 2.6 g/dL — ABNORMAL LOW (ref 3.5–5.0)
Anion gap: 11 (ref 5–15)
BUN: 35 mg/dL — ABNORMAL HIGH (ref 8–23)
CO2: 28 mmol/L (ref 22–32)
Calcium: 8.6 mg/dL — ABNORMAL LOW (ref 8.9–10.3)
Chloride: 98 mmol/L (ref 98–111)
Creatinine, Ser: 1.17 mg/dL (ref 0.61–1.24)
GFR, Estimated: >60 mL/min (ref >60)
Glucose, Bld: 133 mg/dL — ABNORMAL HIGH (ref 70–99)
Phosphorus: 3.1 mg/dL (ref 2.5–4.6)
Potassium: 4.5 mmol/L (ref 3.5–5.1)
Sodium: 137 mmol/L (ref 135–145)

## 2020-07-13 LAB — CBC WITH DIFFERENTIAL/PLATELET
Abs Immature Granulocytes: 0.13 10*3/uL — ABNORMAL HIGH (ref 0.00–0.07)
Basophils Absolute: 0 10*3/uL (ref 0.0–0.1)
Basophils Relative: 0 %
Eosinophils Absolute: 0.3 10*3/uL (ref 0.0–0.5)
Eosinophils Relative: 2 %
HCT: 35.1 % — ABNORMAL LOW (ref 39.0–52.0)
Hemoglobin: 11.7 g/dL — ABNORMAL LOW (ref 13.0–17.0)
Immature Granulocytes: 1 %
Lymphocytes Relative: 9 %
Lymphs Abs: 1.2 10*3/uL (ref 0.7–4.0)
MCH: 30.2 pg (ref 26.0–34.0)
MCHC: 33.3 g/dL (ref 30.0–36.0)
MCV: 90.7 fL (ref 80.0–100.0)
Monocytes Absolute: 1.3 10*3/uL — ABNORMAL HIGH (ref 0.1–1.0)
Monocytes Relative: 10 %
Neutro Abs: 10.9 10*3/uL — ABNORMAL HIGH (ref 1.7–7.7)
Neutrophils Relative %: 78 %
Platelets: 238 10*3/uL (ref 150–400)
RBC: 3.87 MIL/uL — ABNORMAL LOW (ref 4.22–5.81)
RDW: 14.2 % (ref 11.5–15.5)
WBC: 13.8 10*3/uL — ABNORMAL HIGH (ref 4.0–10.5)
nRBC: 0 % (ref 0.0–0.2)

## 2020-07-13 LAB — GLUCOSE, CAPILLARY
Glucose-Capillary: 131 mg/dL — ABNORMAL HIGH (ref 70–99)
Glucose-Capillary: 162 mg/dL — ABNORMAL HIGH (ref 70–99)
Glucose-Capillary: 187 mg/dL — ABNORMAL HIGH (ref 70–99)
Glucose-Capillary: 263 mg/dL — ABNORMAL HIGH (ref 70–99)

## 2020-07-13 LAB — CREATININE, SERUM
Creatinine, Ser: 1.17 mg/dL (ref 0.61–1.24)
GFR, Estimated: >60 mL/min (ref >60)

## 2020-07-13 LAB — MAGNESIUM: Magnesium: 2.2 mg/dL (ref 1.7–2.4)

## 2020-07-13 MED ORDER — INSULIN DETEMIR 100 UNIT/ML ~~LOC~~ SOLN
8.0000 [IU] | Freq: Two times a day (BID) | SUBCUTANEOUS | Status: DC
  Administered 2020-07-13 – 2020-07-19 (×12): 8 [IU] via SUBCUTANEOUS
  Filled 2020-07-13 (×14): qty 0.08

## 2020-07-13 MED ORDER — INSULIN ASPART 100 UNIT/ML ~~LOC~~ SOLN
4.0000 [IU] | Freq: Three times a day (TID) | SUBCUTANEOUS | Status: DC
  Administered 2020-07-13 (×2): 4 [IU] via SUBCUTANEOUS
  Filled 2020-07-13 (×2): qty 1

## 2020-07-13 MED ORDER — HEPARIN SODIUM (PORCINE) 5000 UNIT/ML IJ SOLN
5000.0000 [IU] | Freq: Three times a day (TID) | INTRAMUSCULAR | Status: DC
  Administered 2020-07-13 – 2020-07-19 (×18): 5000 [IU] via SUBCUTANEOUS
  Filled 2020-07-13 (×16): qty 1

## 2020-07-13 NOTE — Plan of Care (Signed)
  Problem: Health Behavior/Discharge Planning: ?Goal: Ability to manage health-related needs will improve ?Outcome: Progressing ?  ?Problem: Clinical Measurements: ?Goal: Will remain free from infection ?Outcome: Progressing ?  ?Problem: Clinical Measurements: ?Goal: Respiratory complications will improve ?Outcome: Progressing ?  ?Problem: Clinical Measurements: ?Goal: Cardiovascular complication will be avoided ?Outcome: Progressing ?  ?Problem: Pain Managment: ?Goal: General experience of comfort will improve ?Outcome: Progressing ?  ?

## 2020-07-13 NOTE — Progress Notes (Signed)
02 sats 100% on 5 Liters, turned down to 4 Liters

## 2020-07-13 NOTE — Progress Notes (Signed)
PROGRESS NOTE    Scott Lawson.  EXH:[id number] DOB: 05-11-1941 DOA: 06/20/2020 PCP: Scott Lick, NP   Chief complaint. Shortness of breath. Brief Narrative:  Scott Lawson a 80 y.o.malewith medical history significant for hypertension, type II DM, BPH, hyperlipidemia, obesity, CAD, who presented to the hospital with generalized weakness, lightheadedness and increasing shortness of breath.  He was admitted to the hospital for [id number] pneumonia complicated by acute hypoxic respiratory failure. He is unvaccinated. He was treated with steroids, remdesivir and baricitinib. He was treated with oxygen including oxygen via heated humidified high flow nasal cannula. He had hypoglycemic episodes requiring adjustments in hypoglycemic regimen.  He developed a superficial wound on the left forearm. He attributed this to infiltration of peripheral IV cannula. Orthopedic surgery was consulted to evaluate the wound but there is no indication for surgery. He was started on doxycycline for probable wound infection.  1/26.Suddenly worsening oxygenation last night, requiring 15 L oxygen.CT angiogram of the chest showed a severe bilateral airspace disease,worse than previous imaging study. Solu-Medrol restarted.   Assessment & Plan:   Principal Problem:   Pneumonia due to COVID-19 virus Active Problems:   Acute renal failure superimposed on stage 3a chronic kidney disease (HCC)   Hyperglycemia due to type 2 diabetes mellitus (HCC)   Generalized weakness   Acute respiratory failure due to COVID-19 (HCC)   Obesity (BMI 30-39.9)   #1.  Acute hypoxemic respiratory failure secondary to COVID-19 pneumonia. COVID-19 pneumonia. Patient condition is getting better again, currently on 6 L oxygen. I will continue steroids with prednisone 20 mg daily.  #2. Uncontrolled type 2 diabetes with hyperglycemia. Glucose is better today. Reduced dose of lispro, Levemir.  #3.  Acute kidney injury on chronic kidney disease stage IIIa. Hyponatremia Hyperkalemia. Function is better. Potassium normalized today. Recheck a BMP tomorrow.    DVT prophylaxis: Heparin Code Status: Full Family Communication: daughter updated Disposition Plan:  .   Status is: Inpatient  Remains inpatient appropriate because:Inpatient level of care appropriate due to severity of illness   Dispo: The patient is from: Home              Anticipated d/c is to: Pending decision              Anticipated d/c date is: > 3 days              Patient currently is not medically stable to d/c.   Difficult to place patient No        I/O last 3 completed shifts: In: 357 [P.O.:357] Out: 1251 [Urine:1250; Stool:1] No intake/output data recorded.     Consultants:   None  Procedures: None  Antimicrobials:None  Subjective: Patient currently on 6 L oxygen, feels much improved. He has a dry cough, nonproductive. No fever or chills. No abdominal pain or nausea vomiting. He had a normal bowel movement. No headache or dizziness. No chest pain or palpitation.  Objective: Vitals:   07/12/20 2100 07/13/20 0400 07/13/20 0830 07/13/20 0838  BP:   (!) 97/56   Pulse:   86   Resp:   18   Temp:  98.2 F (36.8 C) 97.9 F (36.6 C)   TempSrc:  Oral Oral   SpO2: 95% 93% 92% 91%  Weight:  94.1 kg    Height:        Intake/Output Summary (Last 24 hours) at 07/13/2020 1034 Last data filed at 07/13/2020 0146 Gross per 24 hour  Intake 357 ml  Output 551 ml  Net -194 ml   Filed Weights   07/10/20 0357 07/11/20 0400 07/13/20 0400  Weight: 91.9 kg 91 kg 94.1 kg    Examination:  General exam: Appears calm and comfortable  Respiratory system: Decreased breathing sounds. Respiratory effort normal. Cardiovascular system: S1 & S2 heard, RRR. No JVD, murmurs, rubs, gallops or clicks. No pedal edema. Gastrointestinal system: Abdomen is nondistended, soft and nontender. No organomegaly or  masses felt. Normal bowel sounds heard. Central nervous system: Alert and oriented. No focal neurological deficits. Extremities: Symmetric  Skin: No rashes, lesions or ulcers Psychiatry: Judgement and insight appear normal. Mood & affect appropriate.     Data Reviewed: I have personally reviewed following labs and imaging studies  CBC: Recent Labs  Lab 07/09/20 0528 07/10/20 0657 07/11/20 0623 07/12/20 0700 07/13/20 0332  WBC 12.3* 11.5* 9.1 15.1* 13.8*  NEUTROABS 9.6* 9.0* 8.2* 13.6* 10.9*  HGB 12.0* 12.0* 11.4* 11.7* 11.7*  HCT 37.6* 38.2* 35.7* 36.4* 35.1*  MCV 91.3 91.8 91.8 91.5 90.7  PLT 216 237 234 247 [id number]   Basic Metabolic Panel: Recent Labs  Lab 07/09/20 0528 07/10/20 0657 07/11/20 0623 07/12/20 0700 07/13/20 0332  NA 134* 134* 131* 135 137  K 4.6 4.9 5.6* 5.6* 4.5  CL 94* 95* 93* 96* 98  CO2 29 30 28 30 28   GLUCOSE 161* 202* 328* 283* 133*  BUN 22 23 24* 31* 35*  CREATININE 1.22 1.29* 1.33* 1.33* 1.17  CALCIUM 8.3* 8.6* 8.5* 8.7* 8.6*  MG 2.2 2.2 2.2 2.4 2.2  PHOS 3.4 3.5 4.4 3.8 3.1   GFR: Estimated Creatinine Clearance: 58 mL/min (by C-G formula based on SCr of 1.17 mg/dL). Liver Function Tests: Recent Labs  Lab 07/09/20 0528 07/10/20 0657 07/11/20 0623 07/12/20 0700 07/13/20 0332  ALBUMIN 2.6* 2.7* 2.6* 2.6* 2.6*   No results for input(s): LIPASE, AMYLASE in the last 168 hours. No results for input(s): AMMONIA in the last 168 hours. Coagulation Profile: No results for input(s): INR, PROTIME in the last 168 hours. Cardiac Enzymes: No results for input(s): CKTOTAL, CKMB, CKMBINDEX, TROPONINI in the last 168 hours. BNP (last 3 results) No results for input(s): PROBNP in the last 8760 hours. HbA1C: No results for input(s): HGBA1C in the last 72 hours. CBG: Recent Labs  Lab 07/12/20 0839 07/12/20 1210 07/12/20 1651 07/12/20 2231 07/13/20 0856  GLUCAP 282* 413* 367* 294* 131*   Lipid Profile: No results for input(s): CHOL, HDL,  LDLCALC, TRIG, CHOLHDL, LDLDIRECT in the last 72 hours. Thyroid Function Tests: No results for input(s): TSH, T4TOTAL, FREET4, T3FREE, THYROIDAB in the last 72 hours. Anemia Panel: No results for input(s): VITAMINB12, FOLATE, FERRITIN, TIBC, IRON, RETICCTPCT in the last 72 hours. Sepsis Labs: Recent Labs  Lab 07/10/20 0657  PROCALCITON <0.10    No results found for this or any previous visit (from the past 240 hour(s)).       Radiology Studies: No results found.      Scheduled Meds: . albuterol  2 puff Inhalation Q6H  . vitamin C  500 mg Oral Daily  . aspirin EC  81 mg Oral Daily  . atorvastatin  80 mg Oral Daily  . Chlorhexidine Gluconate Cloth  6 each Topical Daily  . doxycycline  100 mg Oral Q12H  . insulin aspart  0-20 Units Subcutaneous TID AC  . insulin aspart  0-5 Units Subcutaneous QHS  . insulin aspart  4 Units Subcutaneous TID WC  . insulin detemir  8 Units Subcutaneous  BID  . linagliptin  5 mg Oral Daily  . polyethylene glycol  17 g Oral Daily  . predniSONE  20 mg Oral Q breakfast  . senna-docusate  1 tablet Oral BID  . tamsulosin  0.4 mg Oral Daily  . zinc sulfate  220 mg Oral Daily   Continuous Infusions:   LOS: 23 days    Time spent: 28 minutes    Sharen Hones, MD [facility]   To contact the attending provider between 7A-7P or the covering provider during after hours 7P-7A, please log into the web site [email or web address] and access using universal  password for that web site. If you do not have the password, please call the hospital operator.  07/13/2020, 10:34 AM

## 2020-07-13 NOTE — Progress Notes (Signed)
Physical Therapy Treatment Patient Details Name: Scott Lawson. MRN: [id number] DOB: [birth year] Today's Date: 07/13/2020    History of Present Illness presented to ER secondary to general malaise, lightheadedness and SOB; admitted for management of acute respiratory failure due to COVID-19, PNA    PT Comments    Pt seen for PT treatment with pt on 10L/min HFNC throughout session. Pt performs BLE strengthening exercises as noted below with cuing for technique. Pt is able to transfer sit>stand with mod fade to min assist with cuing for hand placement. Pt initially stands without UE support <10 seconds with significant posterior lean & poor ability to correct. Provided pt with RW & pt able to stand 2 additional trials with min assist for standing balance but ongoing cuing to correct posterior lean & ~20 seconds each trial. Pt requires significantly long rest breaks between each activity with ongoing education re: pursed lip breathing. SpO2 lowest of 73% but 89% upon PT departure - nurse made aware. Also reviewed use of incentive spirometer with pt & reviewed proper use as pt not performing it correctly. Pt also demonstrates poor awareness re: need for supplemental oxygen reporting "I have a machine I wear at night. What are they going to do about my oxygen?" with PT attempting to educate him. Continue to recommend STR upon d/c as pt would benefit from additional therapies to maximize independence with functional mobility & reduce fall risk prior to return home.    Follow Up Recommendations  SNF     Equipment Recommendations  None recommended by PT    Recommendations for Other Services       Precautions / Restrictions Precautions Precautions: Fall Restrictions Weight Bearing Restrictions: No    Mobility  Bed Mobility Overal bed mobility:  (not observed, pt received & left in recliner)                Transfers Overall transfer level: Needs assistance Equipment used:  Rolling walker (2 wheeled) Transfers: Sit to/from Stand Sit to Stand: Min assist;Mod assist         General transfer comment: cuing for hand placement to push to standing on armrests vs pulling on RW, does not reach back for armrests prior to stand>sit  Ambulation/Gait                 Stairs             Wheelchair Mobility    Modified Rankin (Stroke Patients Only)       Balance Overall balance assessment: Needs assistance         Standing balance support: No upper extremity supported Standing balance-Leahy Scale: Poor Standing balance comment: poor ability to correct posterior lean                            Cognition Arousal/Alertness: Awake/alert Behavior During Therapy: WFL for tasks assessed/performed Overall Cognitive Status: Within Functional Limits for tasks assessed                                        Exercises General Exercises - Lower Extremity Long Arc Quad: AROM;Right;Left;10 reps;Seated Hip Flexion/Marching: AROM;Right;Left;10 reps;Seated    General Comments        Pertinent Vitals/Pain Pain Assessment: No/denies pain    Home Living  Prior Function            PT Goals (current goals can now be found in the care plan section) Acute Rehab PT Goals Patient Stated Goal: to return home PT Goal Formulation: With patient Time For Goal Achievement: 07/27/20 Potential to Achieve Goals: Fair Progress towards PT goals: Progressing toward goals    Frequency    Min 2X/week      PT Plan Current plan remains appropriate    Co-evaluation              AM-PAC PT "6 Clicks" Mobility   Outcome Measure  Help needed turning from your back to your side while in a flat bed without using bedrails?: None   Help needed moving to and from a bed to a chair (including a wheelchair)?: A Lot Help needed standing up from a chair using your arms (e.g., wheelchair or bedside  chair)?: A Little Help needed to walk in hospital room?: A Lot Help needed climbing 3-5 steps with a railing? : A Lot 6 Click Score: 13    End of Session Equipment Utilized During Treatment: Oxygen Activity Tolerance: Patient tolerated treatment well Patient left: in chair;with call bell/phone within reach Nurse Communication:  (O2) PT Visit Diagnosis: Muscle weakness (generalized) (M62.81);Difficulty in walking, not elsewhere classified (R26.2)     Time: 1410-1433 PT Time Calculation (min) (ACUTE ONLY): 23 min  Charges:  $Therapeutic Exercise: 8-22 mins $Therapeutic Activity: 8-22 mins                     Scott Lawson, PT, DPT 07/13/20, 3:05 PM    Scott Lawson 07/13/2020, 3:02 PM

## 2020-07-14 DIAGNOSIS — N170 Acute kidney failure with tubular necrosis: Secondary | ICD-10-CM | POA: Diagnosis not present

## 2020-07-14 DIAGNOSIS — N1831 Chronic kidney disease, stage 3a: Secondary | ICD-10-CM | POA: Diagnosis not present

## 2020-07-14 DIAGNOSIS — U071 COVID-19: Secondary | ICD-10-CM | POA: Diagnosis not present

## 2020-07-14 DIAGNOSIS — J1282 Pneumonia due to coronavirus disease 2019: Secondary | ICD-10-CM | POA: Diagnosis not present

## 2020-07-14 LAB — RENAL FUNCTION PANEL
Albumin: 2.5 g/dL — ABNORMAL LOW (ref 3.5–5.0)
Anion gap: 13 (ref 5–15)
BUN: 35 mg/dL — ABNORMAL HIGH (ref 8–23)
CO2: 25 mmol/L (ref 22–32)
Calcium: 8.5 mg/dL — ABNORMAL LOW (ref 8.9–10.3)
Chloride: 98 mmol/L (ref 98–111)
Creatinine, Ser: 0.94 mg/dL (ref 0.61–1.24)
GFR, Estimated: >60 mL/min (ref >60)
Glucose, Bld: 72 mg/dL (ref 70–99)
Phosphorus: 3.4 mg/dL (ref 2.5–4.6)
Potassium: 4.2 mmol/L (ref 3.5–5.1)
Sodium: 136 mmol/L (ref 135–145)

## 2020-07-14 LAB — CBC WITH DIFFERENTIAL/PLATELET
Abs Immature Granulocytes: 0.14 10*3/uL — ABNORMAL HIGH (ref 0.00–0.07)
Basophils Absolute: 0.1 10*3/uL (ref 0.0–0.1)
Basophils Relative: 1 %
Eosinophils Absolute: 0.6 10*3/uL — ABNORMAL HIGH (ref 0.0–0.5)
Eosinophils Relative: 6 %
HCT: 37.6 % — ABNORMAL LOW (ref 39.0–52.0)
Hemoglobin: 11.7 g/dL — ABNORMAL LOW (ref 13.0–17.0)
Immature Granulocytes: 1 %
Lymphocytes Relative: 13 %
Lymphs Abs: 1.3 10*3/uL (ref 0.7–4.0)
MCH: 29.3 pg (ref 26.0–34.0)
MCHC: 31.1 g/dL (ref 30.0–36.0)
MCV: 94.2 fL (ref 80.0–100.0)
Monocytes Absolute: 1 10*3/uL (ref 0.1–1.0)
Monocytes Relative: 10 %
Neutro Abs: 7.2 10*3/uL (ref 1.7–7.7)
Neutrophils Relative %: 69 %
Platelets: 207 10*3/uL (ref 150–400)
RBC: 3.99 MIL/uL — ABNORMAL LOW (ref 4.22–5.81)
RDW: 14.3 % (ref 11.5–15.5)
WBC: 10.3 10*3/uL (ref 4.0–10.5)
nRBC: 0 % (ref 0.0–0.2)

## 2020-07-14 LAB — GLUCOSE, CAPILLARY
Glucose-Capillary: 99 mg/dL (ref 70–99)
Glucose-Capillary: 280 mg/dL — ABNORMAL HIGH (ref 70–99)
Glucose-Capillary: 201 mg/dL — ABNORMAL HIGH (ref 70–99)
Glucose-Capillary: 186 mg/dL — ABNORMAL HIGH (ref 70–99)

## 2020-07-14 LAB — MAGNESIUM: Magnesium: 2.1 mg/dL (ref 1.7–2.4)

## 2020-07-14 MED ORDER — PANTOPRAZOLE SODIUM 40 MG PO TBEC
40.0000 mg | DELAYED_RELEASE_TABLET | Freq: Two times a day (BID) | ORAL | Status: DC
  Administered 2020-07-14 – 2020-07-19 (×11): 40 mg via ORAL
  Filled 2020-07-14 (×11): qty 1

## 2020-07-14 NOTE — Progress Notes (Addendum)
PROGRESS NOTE    Scott Lawson.  NKN:[id number] DOB: Dec 16, 1940 DOA: 06/20/2020 PCP: Venita Lick, NP   Chief Complaint: shortness of breath Brief Narrative:  Scott Lawsonis a 80 y.o.malewith medical history significant for hypertension, type II DM, BPH, hyperlipidemia, obesity, CAD, who presented to the hospital with generalized weakness, lightheadedness and increasing shortness of breath.  He was admitted to the hospital for [id number] pneumonia complicated by acute hypoxic respiratory failure. He is unvaccinated. He was treated with steroids, remdesivir and baricitinib. He was treated with oxygen including oxygen via heated humidified high flow nasal cannula. He had hypoglycemic episodes requiring adjustments in hypoglycemic regimen.  He developed a superficial wound on the left forearm. He attributed this to infiltration of peripheral IV cannula. Orthopedic surgery was consulted to evaluate the wound but there is no indication for surgery. He was started on doxycycline for probable wound infection.  1/26.Suddenly worsening oxygenation last night, requiring 15 L oxygen.CT angiogram of the chest showed a severe bilateral airspace disease,worse than previous imaging study. Solu-Medrol restarted.   Assessment & Plan:   Principal Problem:   Pneumonia due to COVID-19 virus Active Problems:   Acute renal failure superimposed on stage 3a chronic kidney disease (HCC)   Hyperglycemia due to type 2 diabetes mellitus (HCC)   Generalized weakness   Acute respiratory failure due to COVID-19 (HCC)   Obesity (BMI 30-39.9)  #1.  Acute hypoxemic respiratory failure secondary to COVID-19 pneumonia COVID-19 pneumonia. Patient has been in the hospital for 23 days. Had a worsening short of breath and hypoxemia 1/25, and again last night.  This all happened during the night.  Patient also worked with physical therapy the first time for 3 hours. Patient does not feel  bad.  However, CT scan showed extensive bilateral airspace disease, I suspect pulmonary fibrosis. However, due to worsening short of breath at nighttime, I will try a CPAP in case patient has obstructive sleep apnea. Currently he is on extended treatment with prednisone.  #2.  Uncontrolled type 2 diabetes with hyperglycemia. Weaning down prednisone dose.  Glucose is better, discontinue scheduled short acting insulin.  3.  Acute kidney injury on chronic kidney disease stage IIIa. Hyponatremia Hyperkalemia. Renal function much improved.  Potassium also normalized.  4.  Upper abdominal pain with history of peptic ulcer disease. Start Protonix twice a day.    DVT prophylaxis: Heparin Code Status: Full Family Communication:  Disposition Plan:  .   Status is: Inpatient  Remains inpatient appropriate because:Inpatient level of care appropriate due to severity of illness   Dispo: The patient is from: Home              Anticipated d/c is to: Home               Anticipated d/c date is: 3 days              Patient currently is not medically stable to d/c.   Difficult to place patient No        I/O last 3 completed shifts: In: 21 [P.O.:357] Out: 1001 [Urine:1000; Stool:1] No intake/output data recorded.     Consultants:   None  Procedures: None  Antimicrobials: None  Subjective: Patient states that he worked with physical therapy and Occupational Therapy for 3 hours yesterday.  He is retired.  He also had an increased oxygen requirement last night, again to his 10 L from 6 L. There is no cough. No fever chills. He has some  upper stomach discomfort, he states that he has peptic ulcer disease, he had a normal bowel movements.   Objective: Vitals:   07/13/20 2000 07/13/20 2300 07/14/20 0251 07/14/20 0806  BP: 113/65  (!) 102/52 (!) 102/55  Pulse: 83  70 76  Resp:   18 20  Temp: 98.1 F (36.7 C)  98.3 F (36.8 C) 98.8 F (37.1 C)  TempSrc: Oral   Oral  SpO2:  100% 97% 93% 95%  Weight:   93.9 kg   Height:        Intake/Output Summary (Last 24 hours) at 07/14/2020 0853 Last data filed at 07/14/2020 0311 Gross per 24 hour  Intake -  Output 450 ml  Net -450 ml   Filed Weights   07/11/20 0400 07/13/20 0400 07/14/20 0251  Weight: 91 kg 94.1 kg 93.9 kg    Examination:  General exam: Appears calm and comfortable  Respiratory system: Decreased breathing sounds without crackles or wheezes. Respiratory effort normal. Cardiovascular system: S1 & S2 heard, RRR. No JVD, murmurs, rubs, gallops or clicks. No pedal edema. Gastrointestinal system: Abdomen is nondistended, soft and nontender. No organomegaly or masses felt. Normal bowel sounds heard. Central nervous system: Alert and oriented. No focal neurological deficits. Extremities: Symmetric 5 x 5 power. Skin: No rashes, lesions or ulcers Psychiatry: Mood & affect appropriate.     Data Reviewed: I have personally reviewed following labs and imaging studies  CBC: Recent Labs  Lab 07/10/20 0657 07/11/20 0623 07/12/20 0700 07/13/20 0332 07/14/20 0542  WBC 11.5* 9.1 15.1* 13.8* 10.3  NEUTROABS 9.0* 8.2* 13.6* 10.9* 7.2  HGB 12.0* 11.4* 11.7* 11.7* 11.7*  HCT 38.2* 35.7* 36.4* 35.1* 37.6*  MCV 91.8 91.8 91.5 90.7 94.2  PLT 237 234 247 238 [id number]   Basic Metabolic Panel: Recent Labs  Lab 07/10/20 0657 07/11/20 0623 07/12/20 0700 07/13/20 0332 07/14/20 0542  NA 134* 131* 135 137 136  K 4.9 5.6* 5.6* 4.5 4.2  CL 95* 93* 96* 98 98  CO2 30 28 30 28 25   GLUCOSE 202* 328* 283* 133* 72  BUN 23 24* 31* 35* 35*  CREATININE 1.29* 1.33* 1.33* 1.17  1.17 0.94  CALCIUM 8.6* 8.5* 8.7* 8.6* 8.5*  MG 2.2 2.2 2.4 2.2 2.1  PHOS 3.5 4.4 3.8 3.1 3.4   GFR: Estimated Creatinine Clearance: 72.1 mL/min (by C-G formula based on SCr of 0.94 mg/dL). Liver Function Tests: Recent Labs  Lab 07/10/20 0657 07/11/20 0623 07/12/20 0700 07/13/20 0332 07/14/20 0542  ALBUMIN 2.7* 2.6* 2.6* 2.6* 2.5*    No results for input(s): LIPASE, AMYLASE in the last 168 hours. No results for input(s): AMMONIA in the last 168 hours. Coagulation Profile: No results for input(s): INR, PROTIME in the last 168 hours. Cardiac Enzymes: No results for input(s): CKTOTAL, CKMB, CKMBINDEX, TROPONINI in the last 168 hours. BNP (last 3 results) No results for input(s): PROBNP in the last 8760 hours. HbA1C: No results for input(s): HGBA1C in the last 72 hours. CBG: Recent Labs  Lab 07/13/20 0856 07/13/20 1236 07/13/20 1741 07/13/20 2023 07/14/20 0850  GLUCAP 131* 162* 187* 263* 99   Lipid Profile: No results for input(s): CHOL, HDL, LDLCALC, TRIG, CHOLHDL, LDLDIRECT in the last 72 hours. Thyroid Function Tests: No results for input(s): TSH, T4TOTAL, FREET4, T3FREE, THYROIDAB in the last 72 hours. Anemia Panel: No results for input(s): VITAMINB12, FOLATE, FERRITIN, TIBC, IRON, RETICCTPCT in the last 72 hours. Sepsis Labs: Recent Labs  Lab 07/10/20 0657  PROCALCITON <0.10  No results found for this or any previous visit (from the past 240 hour(s)).       Radiology Studies: No results found.      Scheduled Meds: . albuterol  2 puff Inhalation Q6H  . vitamin C  500 mg Oral Daily  . aspirin EC  81 mg Oral Daily  . atorvastatin  80 mg Oral Daily  . Chlorhexidine Gluconate Cloth  6 each Topical Daily  . heparin  5,000 Units Subcutaneous Q8H  . insulin aspart  0-20 Units Subcutaneous TID AC  . insulin aspart  0-5 Units Subcutaneous QHS  . insulin aspart  4 Units Subcutaneous TID WC  . insulin detemir  8 Units Subcutaneous BID  . linagliptin  5 mg Oral Daily  . polyethylene glycol  17 g Oral Daily  . predniSONE  20 mg Oral Q breakfast  . senna-docusate  1 tablet Oral BID  . tamsulosin  0.4 mg Oral Daily  . zinc sulfate  220 mg Oral Daily   Continuous Infusions:   LOS: 24 days    Time spent: 35 minutes    Sharen Hones, MD [facility]   To contact the attending  provider between 7A-7P or the covering provider during after hours 7P-7A, please log into the web site [email or web address] and access using universal [place] password for that web site. If you do not have the password, please call the hospital operator.  07/14/2020, 8:53 AM

## 2020-07-15 DIAGNOSIS — N170 Acute kidney failure with tubular necrosis: Secondary | ICD-10-CM | POA: Diagnosis not present

## 2020-07-15 DIAGNOSIS — J1282 Pneumonia due to coronavirus disease 2019: Secondary | ICD-10-CM | POA: Diagnosis not present

## 2020-07-15 DIAGNOSIS — N1831 Chronic kidney disease, stage 3a: Secondary | ICD-10-CM | POA: Diagnosis not present

## 2020-07-15 DIAGNOSIS — U071 COVID-19: Secondary | ICD-10-CM | POA: Diagnosis not present

## 2020-07-15 LAB — RENAL FUNCTION PANEL
Albumin: 2.6 g/dL — ABNORMAL LOW (ref 3.5–5.0)
Anion gap: 10 (ref 5–15)
BUN: 30 mg/dL — ABNORMAL HIGH (ref 8–23)
CO2: 28 mmol/L (ref 22–32)
Calcium: 8.5 mg/dL — ABNORMAL LOW (ref 8.9–10.3)
Chloride: 95 mmol/L — ABNORMAL LOW (ref 98–111)
Creatinine, Ser: 1.33 mg/dL — ABNORMAL HIGH (ref 0.61–1.24)
GFR, Estimated: 54 mL/min — ABNORMAL LOW (ref >60)
Glucose, Bld: 225 mg/dL — ABNORMAL HIGH (ref 70–99)
Phosphorus: 3.3 mg/dL (ref 2.5–4.6)
Potassium: 4.5 mmol/L (ref 3.5–5.1)
Sodium: 133 mmol/L — ABNORMAL LOW (ref 135–145)

## 2020-07-15 LAB — CBC WITH DIFFERENTIAL/PLATELET
Abs Immature Granulocytes: 0.15 10*3/uL — ABNORMAL HIGH (ref 0.00–0.07)
Basophils Absolute: 0 10*3/uL (ref 0.0–0.1)
Basophils Relative: 0 %
Eosinophils Absolute: 0.8 10*3/uL — ABNORMAL HIGH (ref 0.0–0.5)
Eosinophils Relative: 9 %
HCT: 36.1 % — ABNORMAL LOW (ref 39.0–52.0)
Hemoglobin: 11.4 g/dL — ABNORMAL LOW (ref 13.0–17.0)
Immature Granulocytes: 2 %
Lymphocytes Relative: 14 %
Lymphs Abs: 1.3 10*3/uL (ref 0.7–4.0)
MCH: 29.1 pg (ref 26.0–34.0)
MCHC: 31.6 g/dL (ref 30.0–36.0)
MCV: 92.1 fL (ref 80.0–100.0)
Monocytes Absolute: 0.9 10*3/uL (ref 0.1–1.0)
Monocytes Relative: 10 %
Neutro Abs: 6.2 10*3/uL (ref 1.7–7.7)
Neutrophils Relative %: 65 %
Platelets: 229 10*3/uL (ref 150–400)
RBC: 3.92 MIL/uL — ABNORMAL LOW (ref 4.22–5.81)
RDW: 14.4 % (ref 11.5–15.5)
WBC: 9.5 10*3/uL (ref 4.0–10.5)
nRBC: 0 % (ref 0.0–0.2)

## 2020-07-15 LAB — GLUCOSE, CAPILLARY
Glucose-Capillary: 180 mg/dL — ABNORMAL HIGH (ref 70–99)
Glucose-Capillary: 176 mg/dL — ABNORMAL HIGH (ref 70–99)
Glucose-Capillary: 251 mg/dL — ABNORMAL HIGH (ref 70–99)
Glucose-Capillary: 372 mg/dL — ABNORMAL HIGH (ref 70–99)

## 2020-07-15 LAB — MAGNESIUM: Magnesium: 2.1 mg/dL (ref 1.7–2.4)

## 2020-07-15 MED ORDER — IPRATROPIUM-ALBUTEROL 20-100 MCG/ACT IN AERS
1.0000 | INHALATION_SPRAY | Freq: Four times a day (QID) | RESPIRATORY_TRACT | Status: DC | PRN
  Filled 2020-07-15: qty 4

## 2020-07-15 MED ORDER — SODIUM CHLORIDE 0.9 % IV SOLN: INTRAVENOUS | Status: AC

## 2020-07-15 MED ORDER — MOMETASONE FURO-FORMOTEROL FUM 200-5 MCG/ACT IN AERO
2.0000 | INHALATION_SPRAY | Freq: Two times a day (BID) | RESPIRATORY_TRACT | Status: DC
  Administered 2020-07-15 – 2020-07-19 (×9): 2 via RESPIRATORY_TRACT
  Filled 2020-07-15: qty 8.8

## 2020-07-15 NOTE — Progress Notes (Signed)
IS education attempted, pt sleeping on CPAP. IS left in room for education

## 2020-07-15 NOTE — Progress Notes (Signed)
PROGRESS NOTE    Scott Lawson.  CHE:[id number] DOB: [birth year] DOA: 06/20/2020 PCP: Venita Lick, NP   Chief complaint.  Shortness of breath. Brief Narrative:  Scott Lawsonis a 80 y.o.malewith medical history significant for hypertension, type II DM, BPH, hyperlipidemia, obesity, CAD, who presented to the hospital with generalized weakness, lightheadedness and increasing shortness of breath.  He was admitted to the hospital for NTIRW-43 pneumonia complicated by acute hypoxic respiratory failure. He is unvaccinated. He was treated with steroids, remdesivir and baricitinib. He was treated with oxygen including oxygen via heated humidified high flow nasal cannula. He had hypoglycemic episodes requiring adjustments in hypoglycemic regimen.  He developed a superficial wound on the left forearm. He attributed this to infiltration of peripheral IV cannula. Orthopedic surgery was consulted to evaluate the wound but there is no indication for surgery. He was started on doxycycline for probable wound infection.  1/26.Suddenly worsening oxygenation last night, requiring 15 L oxygen.CT angiogram of the chest showed a severe bilateral airspace disease,worse than previous imaging study. Solu-Medrol restarted.   Assessment & Plan:   Principal Problem:   Pneumonia due to COVID-19 virus Active Problems:   Acute renal failure superimposed on stage 3a chronic kidney disease (HCC)   Hyperglycemia due to type 2 diabetes mellitus (HCC)   Generalized weakness   Acute respiratory failure due to COVID-19 (HCC)   Obesity (BMI 30-39.9)  #1.  Acute hypoxemic respiratory failure secondary to COVID-19 pneumonia COVID-19 pneumonia. Patient had a intermittent worsening hypoxemia while in the hospital, he has been in hospital for 24 days.  CT scanning still have severe bilateral airspace disease.  Suspecting pulmonary fibrosis. Patient currently restarted on lower dose steroids  with prednisone. Due to frequent worsening hypoxemia at nighttime, CPAP will be started. Also started as needed Combivent and a scheduled Dulera. Continue incentive spirometer.  #2.  Uncontrolled type 2 diabetes with hyperglycemia. Continue current regimen for now.  #3.  Acute kidney injury with hyponatremia. Patient appears to have some mild dehydration at this time.  We will give 500 ml of normal saline.  #4.  Upper abdominal pain with history of peptic ulcer disease. Abdominal pain is better after start Protonix.   DVT prophylaxis: Heparin Code Status: Full Family Communication: daughter updated Disposition Plan:  .   Status is: Inpatient  Remains inpatient appropriate because:Inpatient level of care appropriate due to severity of illness   Dispo: The patient is from: Home              Anticipated d/c is to: Home              Anticipated d/c date is: > 3 days              Patient currently is not medically stable to d/c.   Difficult to place patient No        I/O last 3 completed shifts: In: 14 [P.O.:960] Out: 1501 [Urine:1500; Stool:1] No intake/output data recorded.     Consultants:   none  Procedures: none  Antimicrobials: none  Subjective: Patient still have intermittent drop of oxygen saturation, twice at nighttime.  Also after exertion.  No cough. No abdominal pain or nausea vomiting.  Abdominal discomfort much better after Protonix. He states that his appetite is good, but renal function worse today. No fever chills. No dysuria or hematuria.  Objective: Vitals:   07/15/20 0227 07/15/20 0355 07/15/20 0738 07/15/20 0817  BP:  (!) 94/41 105/74   Pulse:  90 99   Resp:  16 20   Temp:  97.7 F (36.5 C) 98.1 F (36.7 C)   TempSrc:  Oral Oral   SpO2: 97% 92% 90% 93%  Weight:  93.6 kg    Height:        Intake/Output Summary (Last 24 hours) at 07/15/2020 1047 Last data filed at 07/14/2020 2300 Gross per 24 hour  Intake 960 ml  Output 1051  ml  Net -91 ml   Filed Weights   07/13/20 0400 07/14/20 0251 07/15/20 0355  Weight: 94.1 kg 93.9 kg 93.6 kg    Examination:  General exam: Appears calm and comfortable  Respiratory system: Decreased breathing sounds without crackles wheezes. Respiratory effort normal. Cardiovascular system: S1 & S2 heard, RRR. No JVD, murmurs, rubs, gallops or clicks. No pedal edema. Gastrointestinal system: Abdomen is nondistended, soft and nontender. No organomegaly or masses felt. Normal bowel sounds heard. Central nervous system: Alert and oriented. No focal neurological deficits. Extremities: Symmetric Skin: No rashes, lesions or ulcers Psychiatry: Judgement and insight appear normal. Mood & affect appropriate.     Data Reviewed: I have personally reviewed following labs and imaging studies  CBC: Recent Labs  Lab 07/11/20 0623 07/12/20 0700 07/13/20 0332 07/14/20 0542 07/15/20 0359  WBC 9.1 15.1* 13.8* 10.3 9.5  NEUTROABS 8.2* 13.6* 10.9* 7.2 6.2  HGB 11.4* 11.7* 11.7* 11.7* 11.4*  HCT 35.7* 36.4* 35.1* 37.6* 36.1*  MCV 91.8 91.5 90.7 94.2 92.1  PLT 234 247 238 207 [id number]   Basic Metabolic Panel: Recent Labs  Lab 07/11/20 0623 07/12/20 0700 07/13/20 0332 07/14/20 0542 07/15/20 0359  NA 131* 135 137 136 133*  K 5.6* 5.6* 4.5 4.2 4.5  CL 93* 96* 98 98 95*  CO2 28 30 28 25 28   GLUCOSE 328* 283* 133* 72 225*  BUN 24* 31* 35* 35* 30*  CREATININE 1.33* 1.33* 1.17  1.17 0.94 1.33*  CALCIUM 8.5* 8.7* 8.6* 8.5* 8.5*  MG 2.2 2.4 2.2 2.1 2.1  PHOS 4.4 3.8 3.1 3.4 3.3   GFR: Estimated Creatinine Clearance: 50.9 mL/min (A) (by C-G formula based on SCr of 1.33 mg/dL (H)). Liver Function Tests: Recent Labs  Lab 07/11/20 0623 07/12/20 0700 07/13/20 0332 07/14/20 0542 07/15/20 0359  ALBUMIN 2.6* 2.6* 2.6* 2.5* 2.6*   No results for input(s): LIPASE, AMYLASE in the last 168 hours. No results for input(s): AMMONIA in the last 168 hours. Coagulation Profile: No results for  input(s): INR, PROTIME in the last 168 hours. Cardiac Enzymes: No results for input(s): CKTOTAL, CKMB, CKMBINDEX, TROPONINI in the last 168 hours. BNP (last 3 results) No results for input(s): PROBNP in the last 8760 hours. HbA1C: No results for input(s): HGBA1C in the last 72 hours. CBG: Recent Labs  Lab 07/14/20 0850 07/14/20 1312 07/14/20 1831 07/14/20 2055 07/15/20 0813  GLUCAP 99 280* 201* 186* 180*   Lipid Profile: No results for input(s): CHOL, HDL, LDLCALC, TRIG, CHOLHDL, LDLDIRECT in the last 72 hours. Thyroid Function Tests: No results for input(s): TSH, T4TOTAL, FREET4, T3FREE, THYROIDAB in the last 72 hours. Anemia Panel: No results for input(s): VITAMINB12, FOLATE, FERRITIN, TIBC, IRON, RETICCTPCT in the last 72 hours. Sepsis Labs: Recent Labs  Lab 07/10/20 0657  PROCALCITON <0.10    No results found for this or any previous visit (from the past 240 hour(s)).       Radiology Studies: No results found.      Scheduled Meds: . vitamin C  500 mg Oral Daily  .  aspirin EC  81 mg Oral Daily  . atorvastatin  80 mg Oral Daily  . Chlorhexidine Gluconate Cloth  6 each Topical Daily  . heparin  5,000 Units Subcutaneous Q8H  . insulin aspart  0-20 Units Subcutaneous TID AC  . insulin aspart  0-5 Units Subcutaneous QHS  . insulin detemir  8 Units Subcutaneous BID  . linagliptin  5 mg Oral Daily  . mometasone-formoterol  2 puff Inhalation BID  . pantoprazole  40 mg Oral BID  . polyethylene glycol  17 g Oral Daily  . predniSONE  20 mg Oral Q breakfast  . senna-docusate  1 tablet Oral BID  . tamsulosin  0.4 mg Oral Daily  . zinc sulfate  220 mg Oral Daily   Continuous Infusions: . sodium chloride       LOS: 25 days    Time spent: 28 minutes    Sharen Hones, MD [facility]   To contact the attending provider between 7A-7P or the covering provider during after hours 7P-7A, please log into the web site [email or web address] and access using universal  [facility] password for that web site. If you do not have the password, please call the hospital operator.  07/15/2020, 10:47 AM

## 2020-07-16 DIAGNOSIS — N170 Acute kidney failure with tubular necrosis: Secondary | ICD-10-CM | POA: Diagnosis not present

## 2020-07-16 DIAGNOSIS — N1831 Chronic kidney disease, stage 3a: Secondary | ICD-10-CM | POA: Diagnosis not present

## 2020-07-16 DIAGNOSIS — U071 COVID-19: Secondary | ICD-10-CM | POA: Diagnosis not present

## 2020-07-16 DIAGNOSIS — J1282 Pneumonia due to coronavirus disease 2019: Secondary | ICD-10-CM | POA: Diagnosis not present

## 2020-07-16 LAB — CBC WITH DIFFERENTIAL/PLATELET
Abs Immature Granulocytes: 0.2 10*3/uL — ABNORMAL HIGH (ref 0.00–0.07)
Basophils Absolute: 0 10*3/uL (ref 0.0–0.1)
Basophils Relative: 0 %
Eosinophils Absolute: 0.9 10*3/uL — ABNORMAL HIGH (ref 0.0–0.5)
Eosinophils Relative: 11 %
HCT: 35.3 % — ABNORMAL LOW (ref 39.0–52.0)
Hemoglobin: 11.5 g/dL — ABNORMAL LOW (ref 13.0–17.0)
Immature Granulocytes: 2 %
Lymphocytes Relative: 14 %
Lymphs Abs: 1.2 10*3/uL (ref 0.7–4.0)
MCH: 29.6 pg (ref 26.0–34.0)
MCHC: 32.6 g/dL (ref 30.0–36.0)
MCV: 91 fL (ref 80.0–100.0)
Monocytes Absolute: 0.8 10*3/uL (ref 0.1–1.0)
Monocytes Relative: 10 %
Neutro Abs: 5.1 10*3/uL (ref 1.7–7.7)
Neutrophils Relative %: 63 %
Platelets: 226 10*3/uL (ref 150–400)
RBC: 3.88 MIL/uL — ABNORMAL LOW (ref 4.22–5.81)
RDW: 14.6 % (ref 11.5–15.5)
WBC: 8.2 10*3/uL (ref 4.0–10.5)
nRBC: 0 % (ref 0.0–0.2)

## 2020-07-16 LAB — GLUCOSE, CAPILLARY
Glucose-Capillary: 160 mg/dL — ABNORMAL HIGH (ref 70–99)
Glucose-Capillary: 307 mg/dL — ABNORMAL HIGH (ref 70–99)
Glucose-Capillary: 323 mg/dL — ABNORMAL HIGH (ref 70–99)
Glucose-Capillary: 241 mg/dL — ABNORMAL HIGH (ref 70–99)
Glucose-Capillary: 214 mg/dL — ABNORMAL HIGH (ref 70–99)

## 2020-07-16 LAB — RENAL FUNCTION PANEL
Albumin: 2.6 g/dL — ABNORMAL LOW (ref 3.5–5.0)
Anion gap: 9 (ref 5–15)
BUN: 26 mg/dL — ABNORMAL HIGH (ref 8–23)
CO2: 29 mmol/L (ref 22–32)
Calcium: 8.5 mg/dL — ABNORMAL LOW (ref 8.9–10.3)
Chloride: 97 mmol/L — ABNORMAL LOW (ref 98–111)
Creatinine, Ser: 1.12 mg/dL (ref 0.61–1.24)
GFR, Estimated: >60 mL/min (ref >60)
Glucose, Bld: 197 mg/dL — ABNORMAL HIGH (ref 70–99)
Phosphorus: 3.4 mg/dL (ref 2.5–4.6)
Potassium: 4.3 mmol/L (ref 3.5–5.1)
Sodium: 135 mmol/L (ref 135–145)

## 2020-07-16 LAB — MAGNESIUM: Magnesium: 2.1 mg/dL (ref 1.7–2.4)

## 2020-07-16 MED ORDER — INSULIN ASPART 100 UNIT/ML ~~LOC~~ SOLN
5.0000 [IU] | Freq: Three times a day (TID) | SUBCUTANEOUS | Status: DC
  Administered 2020-07-16 – 2020-07-19 (×8): 5 [IU] via SUBCUTANEOUS
  Filled 2020-07-16 (×7): qty 1

## 2020-07-16 NOTE — Progress Notes (Signed)
PROGRESS NOTE    Scott [employer] Scott Lawson.  JAS:[id number] DOB: Jul 07, 1940 DOA: 06/20/2020 PCP: Venita Lick, NP   Chief complaint.  Shortness of breath. Brief Narrative:  Scott Lawsonis a 80 y.o.malewith medical history significant for hypertension, type II DM, BPH, hyperlipidemia, obesity, CAD, who presented to the hospital with generalized weakness, lightheadedness and increasing shortness of breath.  He was admitted to the hospital for ALPFX-90 pneumonia complicated by acute hypoxic respiratory failure. He is unvaccinated. He was treated with steroids, remdesivir and baricitinib. He was treated with oxygen including oxygen via heated humidified high flow nasal cannula. He had hypoglycemic episodes requiring adjustments in hypoglycemic regimen.  He developed a superficial wound on the left forearm. He attributed this to infiltration of peripheral IV cannula. Orthopedic surgery was consulted to evaluate the wound but there is no indication for surgery. He was started on doxycycline for probable wound infection.  Due to worsening short of breath, CT scan of the chest was performed on 1/26, showed severe bilateral airspace disease.  Patient also seem to have worsening hypoxemia at nighttime, CPAP started for night use.   Assessment & Plan:   Principal Problem:   Pneumonia due to COVID-19 virus Active Problems:   Acute renal failure superimposed on stage 3a chronic kidney disease (HCC)   Hyperglycemia due to type 2 diabetes mellitus (HCC)   Generalized weakness   Acute respiratory failure due to COVID-19 (HCC)   Obesity (BMI 30-39.9)  #1.  Acute hypoxemic respite failure secondary to COVID-19 pneumonia. COVID-19 pneumonia. Patient seem to have a worsening bilateral airspace disease recently, probable pulmonary fibrosis.  However, patient had a worsening hypoxemia at nighttime, considered obstructive sleep apnea.  Patient was placed on CPAP last night, he feels much  better today.  Oxygenation also better.  He still has more hypoxemia with exertion. I restarted steroids but currently on 20 mg prednisone.  Also started scheduled Dulera and as needed complement.  Continue incentive spirometer. Patient overall condition improving, anticipate discharge in 4 to 5 days.  Patient probably will need 3 to 4 L oxygen to go home with.  2.  Uncontrolled type 2 diabetes with hyperglycemia Added to scheduled NovoLog, continue.  3.  Acute kidney injury with hyponatremia. Condition improved again.  4.  Upper abdominal pain with history of peptic ulcer disease. Start Protonix twice a day.  Condition improved.     DVT prophylaxis: Heparin Code Status: Full Family Communication: daughter updated Disposition Plan:  .   Status is: Inpatient  Remains inpatient appropriate because:Inpatient level of care appropriate due to severity of illness   Dispo: The patient is from: Home              Anticipated d/c is to: Home              Anticipated d/c date is: > 3 days              Patient currently is not medically stable to d/c.   Difficult to place patient No        I/O last 3 completed shifts: In: 1440 [P.O.:1440] Out: 950 [Urine:950] Total I/O In: 240 [P.O.:240] Out: 400 [Urine:400]     Consultants:   None  Procedures: None  Antimicrobials: None  Subjective: Patient starting CPAP while napping yesterday, tolerating CPAP last night.  He felt a lot better.  He was actually on 4 L oxygen yesterday. He denies any cough or chest pain. No fever chills No dysuria hematuria.  Objective: Vitals:   07/15/20 1900 07/15/20 2027 07/16/20 0342 07/16/20 0800  BP: 110/67  115/68 125/61  Pulse: 97  66 64  Resp: 20  18 17   Temp: 98.6 F (37 C)  98.1 F (36.7 C) 97.9 F (36.6 C)  TempSrc: Oral  Oral Oral  SpO2: 94% 95% 99% 97%  Weight:      Height:        Intake/Output Summary (Last 24 hours) at 07/16/2020 1135 Last data filed at 07/16/2020  1019 Gross per 24 hour  Intake 720 ml  Output 1350 ml  Net -630 ml   Filed Weights   07/13/20 0400 07/14/20 0251 07/15/20 0355  Weight: 94.1 kg 93.9 kg 93.6 kg    Examination:  General exam: Appears calm and comfortable  Respiratory system: Decreased breathing sounds. Respiratory effort normal. Cardiovascular system: S1 & S2 heard, RRR. No JVD, murmurs, rubs, gallops or clicks. No pedal edema. Gastrointestinal system: Abdomen is nondistended, soft and nontender. No organomegaly or masses felt. Normal bowel sounds heard. Central nervous system: Alert and oriented. No focal neurological deficits. Extremities: Symmetric Skin: No rashes, lesions or ulcers Psychiatry: Judgement and insight appear normal. Mood & affect appropriate.     Data Reviewed: I have personally reviewed following labs and imaging studies  CBC: Recent Labs  Lab 07/12/20 0700 07/13/20 0332 07/14/20 0542 07/15/20 0359 07/16/20 0448  WBC 15.1* 13.8* 10.3 9.5 8.2  NEUTROABS 13.6* 10.9* 7.2 6.2 5.1  HGB 11.7* 11.7* 11.7* 11.4* 11.5*  HCT 36.4* 35.1* 37.6* 36.1* 35.3*  MCV 91.5 90.7 94.2 92.1 91.0  PLT 247 238 207 229 [id number]   Basic Metabolic Panel: Recent Labs  Lab 07/12/20 0700 07/13/20 0332 07/14/20 0542 07/15/20 0359 07/16/20 0448  NA 135 137 136 133* 135  K 5.6* 4.5 4.2 4.5 4.3  CL 96* 98 98 95* 97*  CO2 30 28 25 28 29   GLUCOSE 283* 133* 72 225* 197*  BUN 31* 35* 35* 30* 26*  CREATININE 1.33* 1.17  1.17 0.94 1.33* 1.12  CALCIUM 8.7* 8.6* 8.5* 8.5* 8.5*  MG 2.4 2.2 2.1 2.1 2.1  PHOS 3.8 3.1 3.4 3.3 3.4   GFR: Estimated Creatinine Clearance: 60.4 mL/min (by C-G formula based on SCr of 1.12 mg/dL). Liver Function Tests: Recent Labs  Lab 07/12/20 0700 07/13/20 0332 07/14/20 0542 07/15/20 0359 07/16/20 0448  ALBUMIN 2.6* 2.6* 2.5* 2.6* 2.6*   No results for input(s): LIPASE, AMYLASE in the last 168 hours. No results for input(s): AMMONIA in the last 168 hours. Coagulation Profile: No  results for input(s): INR, PROTIME in the last 168 hours. Cardiac Enzymes: No results for input(s): CKTOTAL, CKMB, CKMBINDEX, TROPONINI in the last 168 hours. BNP (last 3 results) No results for input(s): PROBNP in the last 8760 hours. HbA1C: No results for input(s): HGBA1C in the last 72 hours. CBG: Recent Labs  Lab 07/15/20 0813 07/15/20 1218 07/15/20 1552 07/15/20 1946 07/16/20 0852  GLUCAP 180* 176* 251* 372* 160*   Lipid Profile: No results for input(s): CHOL, HDL, LDLCALC, TRIG, CHOLHDL, LDLDIRECT in the last 72 hours. Thyroid Function Tests: No results for input(s): TSH, T4TOTAL, FREET4, T3FREE, THYROIDAB in the last 72 hours. Anemia Panel: No results for input(s): VITAMINB12, FOLATE, FERRITIN, TIBC, IRON, RETICCTPCT in the last 72 hours. Sepsis Labs: Recent Labs  Lab 07/10/20 0657  PROCALCITON <0.10    No results found for this or any previous visit (from the past 240 hour(s)).       Radiology Studies: No results  found.      Scheduled Meds: . vitamin C  500 mg Oral Daily  . aspirin EC  81 mg Oral Daily  . atorvastatin  80 mg Oral Daily  . Chlorhexidine Gluconate Cloth  6 each Topical Daily  . heparin  5,000 Units Subcutaneous Q8H  . insulin aspart  0-20 Units Subcutaneous TID AC  . insulin aspart  0-5 Units Subcutaneous QHS  . insulin detemir  8 Units Subcutaneous BID  . linagliptin  5 mg Oral Daily  . mometasone-formoterol  2 puff Inhalation BID  . pantoprazole  40 mg Oral BID  . polyethylene glycol  17 g Oral Daily  . predniSONE  20 mg Oral Q breakfast  . senna-docusate  1 tablet Oral BID  . tamsulosin  0.4 mg Oral Daily  . zinc sulfate  220 mg Oral Daily   Continuous Infusions:   LOS: 26 days    Time spent: 28 minutes    Sharen Hones, MD [facility]   To contact the attending provider between 7A-7P or the covering provider during after hours 7P-7A, please log into the web site [email or web address] and access using universal [facility] password for that web site. If you do not have the password, please call the hospital operator.  07/16/2020, 11:35 AM

## 2020-07-16 NOTE — Progress Notes (Signed)
Inpatient Diabetes Program Recommendations  AACE/ADA: New Consensus Statement on Inpatient Glycemic Control (2015)  Target Ranges:  Prepandial:   less than 140 mg/dL      Peak postprandial:   less than 180 mg/dL (1-2 hours)      Critically ill patients:  140 - 180 mg/dL   Results for Scott Lawson, Scott Lawson (MRN [id number]) as of 07/16/2020 06:49  Ref. Range 07/15/2020 08:13 07/15/2020 12:18 07/15/2020 15:52 07/15/2020 19:46  Glucose-Capillary Latest Ref Range: 70 - 99 mg/dL 180 (H)  4 units NOVOLOG  8 units LEVEMIR  176 (H)  4 units NOVOLOG  251 (H)  11 units NOVOLOG   372 (H)  5 units NOVOLOG  8 units LEVEMIR      Home DM Meds: Lantus 25 units daily       Humalog 10 units tid with meals       Metformin 1000 mg bid  Current Orders: Levemir 8 units BID      Novolog Resistant Correction Scale/ SSI (0-20 units) TID AC + HS      Tradjenta 5 mg Daily    Prednisone 20 mg Daily    MD- Please consider the following:  1. Increase Levemir slightly to 10 units BID  2. Start Novolog Meal Coverage: Novolog 4 units TID with meals HOLD if pt eats <50% of meal or NPO    --Will follow patient during hospitalization--  Wyn Quaker RN, MSN, CDE Diabetes Coordinator Inpatient Glycemic Control Team Team Pager: [phone] (8a-5p)

## 2020-07-16 NOTE — Progress Notes (Signed)
Physical Therapy Treatment Patient Details Name: Scott Lawson. MRN: [id number] DOB: [birth year] Today's Date: 07/16/2020    History of Present Illness presented to ER secondary to general malaise, lightheadedness and SOB; admitted for management of acute respiratory failure due to COVID-19, PNA    PT Comments    No longer on isolation precautions. Initially, pt eager to work with PT reporting he feels the "best I've ever felt since being here". Prior to ambulation, pt on 5L of O2 with sats at 92%. Gait training performed with O2 at 8L of O2 with sats decreasing to 76% and HR at 116. Takes extended time to recover to 88%. Left of 8L of O2. Pt becomes very frustrated that he is not able to maintain his O2 and demands to speak to MD as "everyone keeps messing my levels up, turning up/down". Explained to patient on need to trial different amounts of O2 to find what best supports patient. Pt very upset and threatening to leave AMA if "these girls don't follow the doctor's orders". Discussed with MD and RN.    Follow Up Recommendations  SNF     Equipment Recommendations  None recommended by PT    Recommendations for Other Services       Precautions / Restrictions Precautions Precautions: Fall Restrictions Weight Bearing Restrictions: No    Mobility  Bed Mobility Overal bed mobility: Modified Independent       Supine to sit: Modified independent (Device/Increase time)     General bed mobility comments: safe technique  Transfers Overall transfer level: Needs assistance Equipment used: Rolling walker (2 wheeled) Transfers: Sit to/from Stand Sit to Stand: Min assist         General transfer comment: slightly impulsive and demonstrates post leaning during initial attempt to stand, needing cga for correction.  Ambulation/Gait Ambulation/Gait assistance: Min assist Gait Distance (Feet): 40 Feet Assistive device: Rolling walker (2 wheeled) Gait Pattern/deviations:  Step-to pattern     General Gait Details: unsteady with 2 LOB noted. Small BOS noted with choppy steps. Increased sway. Hands on assist to maintain balance. Increased WOB with O2 sats plumetting to 76% post ambulation on 8L. HR 116, takes increased time for recovery.   Stairs             Wheelchair Mobility    Modified Rankin (Stroke Patients Only)       Balance Overall balance assessment: Needs assistance Sitting-balance support: No upper extremity supported Sitting balance-Leahy Scale: Good     Standing balance support: Bilateral upper extremity supported Standing balance-Leahy Scale: Fair                              Cognition Arousal/Alertness: Awake/alert Behavior During Therapy: Agitated Overall Cognitive Status: Within Functional Limits for tasks assessed                                        Exercises Other Exercises Other Exercises: Education provided on energy conservation and O2 needs as pt hoping to dc home. Revisited family/home environment as pt lives alone. Other Exercises: Once recoverd from gait training, assisted with min assist to transfer to recliner. Decreased eccentric control during transfer. Cues for safety and O2 monitored.    General Comments        Pertinent Vitals/Pain Pain Assessment: No/denies pain    Home Living  Prior Function            PT Goals (current goals can now be found in the care plan section) Acute Rehab PT Goals Patient Stated Goal: to return home PT Goal Formulation: With patient Time For Goal Achievement: 07/27/20 Potential to Achieve Goals: Fair Progress towards PT goals: Progressing toward goals    Frequency    Min 2X/week      PT Plan Current plan remains appropriate    Co-evaluation              AM-PAC PT "6 Clicks" Mobility   Outcome Measure  Help needed turning from your back to your side while in a flat bed without using  bedrails?: None Help needed moving from lying on your back to sitting on the side of a flat bed without using bedrails?: A Little Help needed moving to and from a bed to a chair (including a wheelchair)?: A Little Help needed standing up from a chair using your arms (e.g., wheelchair or bedside chair)?: A Little Help needed to walk in hospital room?: A Little Help needed climbing 3-5 steps with a railing? : A Lot 6 Click Score: 18    End of Session Equipment Utilized During Treatment: Oxygen Activity Tolerance: Treatment limited secondary to medical complications (Comment) Patient left: in chair;with call bell/phone within reach Nurse Communication: Mobility status PT Visit Diagnosis: Muscle weakness (generalized) (M62.81);Difficulty in walking, not elsewhere classified (R26.2)     Time: 3212-2482 PT Time Calculation (min) (ACUTE ONLY): 40 min  Charges:  $Gait Training: 23-37 mins $Therapeutic Activity: 8-22 mins                     Greggory Stallion, PT, DPT [phone]    Ani Deoliveira 07/16/2020, 1:17 PM

## 2020-07-17 DIAGNOSIS — R531 Weakness: Secondary | ICD-10-CM | POA: Diagnosis not present

## 2020-07-17 DIAGNOSIS — J9600 Acute respiratory failure, unspecified whether with hypoxia or hypercapnia: Secondary | ICD-10-CM | POA: Diagnosis not present

## 2020-07-17 DIAGNOSIS — J1282 Pneumonia due to coronavirus disease 2019: Secondary | ICD-10-CM | POA: Diagnosis not present

## 2020-07-17 DIAGNOSIS — U071 COVID-19: Secondary | ICD-10-CM | POA: Diagnosis not present

## 2020-07-17 LAB — CBC WITH DIFFERENTIAL/PLATELET
Abs Immature Granulocytes: 0.29 10*3/uL — ABNORMAL HIGH (ref 0.00–0.07)
Basophils Absolute: 0 10*3/uL (ref 0.0–0.1)
Basophils Relative: 1 %
Eosinophils Absolute: 0.9 10*3/uL — ABNORMAL HIGH (ref 0.0–0.5)
Eosinophils Relative: 11 %
HCT: 38.3 % — ABNORMAL LOW (ref 39.0–52.0)
Hemoglobin: 12.3 g/dL — ABNORMAL LOW (ref 13.0–17.0)
Immature Granulocytes: 4 %
Lymphocytes Relative: 16 %
Lymphs Abs: 1.4 10*3/uL (ref 0.7–4.0)
MCH: 29.4 pg (ref 26.0–34.0)
MCHC: 32.1 g/dL (ref 30.0–36.0)
MCV: 91.4 fL (ref 80.0–100.0)
Monocytes Absolute: 0.8 10*3/uL (ref 0.1–1.0)
Monocytes Relative: 9 %
Neutro Abs: 5 10*3/uL (ref 1.7–7.7)
Neutrophils Relative %: 59 %
Platelets: 240 10*3/uL (ref 150–400)
RBC: 4.19 MIL/uL — ABNORMAL LOW (ref 4.22–5.81)
RDW: 14.8 % (ref 11.5–15.5)
WBC: 8.4 10*3/uL (ref 4.0–10.5)
nRBC: 0 % (ref 0.0–0.2)

## 2020-07-17 LAB — RENAL FUNCTION PANEL
Albumin: 2.6 g/dL — ABNORMAL LOW (ref 3.5–5.0)
Anion gap: 10 (ref 5–15)
BUN: 23 mg/dL (ref 8–23)
CO2: 29 mmol/L (ref 22–32)
Calcium: 8.5 mg/dL — ABNORMAL LOW (ref 8.9–10.3)
Chloride: 97 mmol/L — ABNORMAL LOW (ref 98–111)
Creatinine, Ser: 1.1 mg/dL (ref 0.61–1.24)
GFR, Estimated: >60 mL/min (ref >60)
Glucose, Bld: 86 mg/dL (ref 70–99)
Phosphorus: 3.6 mg/dL (ref 2.5–4.6)
Potassium: 4.2 mmol/L (ref 3.5–5.1)
Sodium: 136 mmol/L (ref 135–145)

## 2020-07-17 LAB — MAGNESIUM: Magnesium: 2 mg/dL (ref 1.7–2.4)

## 2020-07-17 LAB — GLUCOSE, CAPILLARY
Glucose-Capillary: 320 mg/dL — ABNORMAL HIGH (ref 70–99)
Glucose-Capillary: 90 mg/dL (ref 70–99)
Glucose-Capillary: 178 mg/dL — ABNORMAL HIGH (ref 70–99)
Glucose-Capillary: 286 mg/dL — ABNORMAL HIGH (ref 70–99)
Glucose-Capillary: 320 mg/dL — ABNORMAL HIGH (ref 70–99)

## 2020-07-17 MED ORDER — PREDNISONE 10 MG PO TABS
10.0000 mg | ORAL_TABLET | Freq: Every day | ORAL | Status: AC
  Administered 2020-07-18 – 2020-07-19 (×2): 10 mg via ORAL
  Filled 2020-07-17 (×2): qty 1

## 2020-07-17 NOTE — Progress Notes (Signed)
Progress Note    Scott Lawson.  TO:[id number] DOB: 1940-12-13  DOA: 06/20/2020 PCP: Venita Lick, NP      Brief Narrative:    Medical records reviewed and are as summarized below:  Scott Lawson. is a 80 y.o. male with medical history significant for hypertension, type II DM, BPH, hyperlipidemia, obesity, CAD, who presented to the hospital with generalized weakness, lightheadedness and increasing shortness of breath.  He was admitted to the hospital for [id number] pneumonia complicated by acute hypoxic respiratory failure.  He is unvaccinated.  He was treated with steroids, remdesivir and baricitinib.  He was treated with oxygen including oxygen via heated humidified high flow nasal cannula.  He had hypoglycemic episodes requiring adjustments in hypoglycemic regimen.  He developed a superficial wound on the left forearm.  He attributed this to infiltration of peripheral IV cannula.  Orthopedic surgery was consulted to evaluate the wound but there was no indication for surgery.  He was started on doxycycline for probable wound infection.  He developed worsening respiratory failure and CT scan of the chest showed severe bilateral airspace disease.  He also had worsening nocturnal hypoxemia requiring BiPAP.     Assessment/Plan:   Principal Problem:   Pneumonia due to COVID-19 virus Active Problems:   Acute renal failure superimposed on stage 3a chronic kidney disease (HCC)   Hyperglycemia due to type 2 diabetes mellitus (HCC)   Generalized weakness   Acute respiratory failure due to COVID-19 (HCC)   Obesity (BMI 30-39.9)   Body mass index is 30.3 kg/m.  (Obesity)   COVID-19 pneumonia: He completed remdesivir on 06/24/2020, baricitinib on 07/04/2020 on steroids 07/05/2020. He was restarted on steroids 07/10/2020.  Taper off steroids as able.  Acute hypoxic respiratory failure: He has fluctuating oxygen requirement.  He has been weaned from 12 to 5 L/min  oxygen via high flow nasal cannula.  He uses BiPAP at night and intermittently during the day.  He has been advised to see a pulmonologist for sleep study as an outpatient to see if he qualifies for CPAP at night.  He does not have hypercapnia on ABG.  Type 2 DM with hyperglycemia: Decrease prednisone.  ContinueLinagliptin, Levemir and NovoLog.  Left forearm superficial wound with skin necrosis: Completed doxycycline.  He had been evaluated by orthopedic surgeon (on 07/09/2020) and there was no indication for surgery.  AKI and hyponatremia: Improved  Generalized weakness: PT recommends discharge to SNF.   Diet Order            Diet regular Room service appropriate? Yes; Fluid consistency: Thin  Diet effective now                    Consultants:  Intensivist  Orthopedic surgeon  Procedures:  None    Medications:   . vitamin C  500 mg Oral Daily  . aspirin EC  81 mg Oral Daily  . atorvastatin  80 mg Oral Daily  . Chlorhexidine Gluconate Cloth  6 each Topical Daily  . heparin  5,000 Units Subcutaneous Q8H  . insulin aspart  0-20 Units Subcutaneous TID AC  . insulin aspart  0-5 Units Subcutaneous QHS  . insulin aspart  5 Units Subcutaneous TID WC  . insulin detemir  8 Units Subcutaneous BID  . linagliptin  5 mg Oral Daily  . mometasone-formoterol  2 puff Inhalation BID  . pantoprazole  40 mg Oral BID  . polyethylene glycol  17 g Oral  Daily  . predniSONE  20 mg Oral Q breakfast  . senna-docusate  1 tablet Oral BID  . tamsulosin  0.4 mg Oral Daily  . zinc sulfate  220 mg Oral Daily   Continuous Infusions:   Anti-infectives (From admission, onward)   Start     Dose/Rate Route Frequency Ordered Stop   07/12/20 2200  doxycycline (VIBRA-TABS) tablet 100 mg        100 mg Oral Every 12 hours 07/12/20 1220 07/13/20 2105   07/10/20 1145  doxycycline (VIBRA-TABS) tablet 100 mg  Status:  Discontinued        100 mg Oral Every 12 hours 07/10/20 1048 07/12/20 1220    07/09/20 1200  doxycycline (VIBRA-TABS) tablet 100 mg  Status:  Discontinued        100 mg Oral Every 12 hours 07/09/20 1113 07/10/20 1045   06/21/20 1000  remdesivir 100 mg in sodium chloride 0.9 % 100 mL IVPB       "Followed by" Linked Group Details   100 mg 200 mL/hr over 30 Minutes Intravenous Daily 06/20/20 2100 06/24/20 1027   06/21/20 1000  remdesivir 100 mg in sodium chloride 0.9 % 100 mL IVPB  Status:  Discontinued       "Followed by" Linked Group Details   100 mg 200 mL/hr over 30 Minutes Intravenous Daily 06/20/20 2207 06/20/20 2217   06/20/20 2200  remdesivir 200 mg in sodium chloride 0.9% 250 mL IVPB  Status:  Discontinued       "Followed by" Linked Group Details   200 mg 580 mL/hr over 30 Minutes Intravenous Once 06/20/20 2207 06/20/20 2217   06/20/20 2115  remdesivir 200 mg in sodium chloride 0.9% 250 mL IVPB       "Followed by" Linked Group Details   200 mg 580 mL/hr over 30 Minutes Intravenous Once 06/20/20 2100 06/20/20 2333             Family Communication/Anticipated D/C date and plan/Code Status   DVT prophylaxis: heparin injection 5,000 Units Start: 07/13/20 1400 SCDs Start: 07/13/20 1039 Place and maintain sequential compression device Start: 07/11/20 1042     Code Status: Full Code  Family Communication: None Disposition Plan:    Status is: Inpatient  Remains inpatient appropriate because:Unsafe d/c plan and Inpatient level of care appropriate due to severity of illness   Dispo: The patient is from: Home              Anticipated d/c is to: SNF              Anticipated d/c date is: 1 day              Patient currently is not medically stable to d/c.   Difficult to place patient No           Subjective:   Interval events noted.  He feels better.  He said the BiPAP helps him a lot.  Objective:    Vitals:   07/17/20 0429 07/17/20 0729 07/17/20 1100 07/17/20 1503  BP: 130/77 113/70 128/68 108/63  Pulse: 65 69 80 90  Resp: 18  17 20 16   Temp: 98.5 F (36.9 C) 98.7 F (37.1 C) 97.7 F (36.5 C) 97.7 F (36.5 C)  TempSrc: Axillary Axillary Axillary   SpO2: 98% 95% 99% 97%  Weight: 93.1 kg     Height:       No data found.   Intake/Output Summary (Last 24 hours) at 07/17/2020 1521 Last data filed  at 07/17/2020 0953 Gross per 24 hour  Intake 720 ml  Output 650 ml  Net 70 ml   Filed Weights   07/14/20 0251 07/15/20 0355 07/17/20 0429  Weight: 93.9 kg 93.6 kg 93.1 kg    Exam:  GEN: NAD SKIN: Warm and dry.  Superficial wound with eschar surrounded by area of erythema on the left anterior forearm. EYES: No pallor or icterus ENT: MMM CV: RRR PULM: CTA B ABD: soft, ND, NT, +BS CNS: AAO x 3, non focal EXT: No edema or tenderness   Data Reviewed:   I have personally reviewed following labs and imaging studies:  Labs: Labs show the following:   Basic Metabolic Panel: Recent Labs  Lab 07/13/20 0332 07/14/20 0542 07/15/20 0359 07/16/20 0448 07/17/20 0446  NA 137 136 133* 135 136  K 4.5 4.2 4.5 4.3 4.2  CL 98 98 95* 97* 97*  CO2 28 25 28 29 29   GLUCOSE 133* 72 225* 197* 86  BUN 35* 35* 30* 26* 23  CREATININE 1.17  1.17 0.94 1.33* 1.12 1.10  CALCIUM 8.6* 8.5* 8.5* 8.5* 8.5*  MG 2.2 2.1 2.1 2.1 2.0  PHOS 3.1 3.4 3.3 3.4 3.6   GFR Estimated Creatinine Clearance: 61.4 mL/min (by C-G formula based on SCr of 1.1 mg/dL). Liver Function Tests: Recent Labs  Lab 07/13/20 0332 07/14/20 0542 07/15/20 0359 07/16/20 0448 07/17/20 0446  ALBUMIN 2.6* 2.5* 2.6* 2.6* 2.6*   No results for input(s): LIPASE, AMYLASE in the last 168 hours. No results for input(s): AMMONIA in the last 168 hours. Coagulation profile No results for input(s): INR, PROTIME in the last 168 hours.  CBC: Recent Labs  Lab 07/13/20 0332 07/14/20 0542 07/15/20 0359 07/16/20 0448 07/17/20 0446  WBC 13.8* 10.3 9.5 8.2 8.4  NEUTROABS 10.9* 7.2 6.2 5.1 5.0  HGB 11.7* 11.7* 11.4* 11.5* 12.3*  HCT 35.1* 37.6* 36.1* 35.3*  38.3*  MCV 90.7 94.2 92.1 91.0 91.4  PLT 238 207 229 226 240   Cardiac Enzymes: No results for input(s): CKTOTAL, CKMB, CKMBINDEX, TROPONINI in the last 168 hours. BNP (last 3 results) No results for input(s): PROBNP in the last 8760 hours. CBG: Recent Labs  Lab 07/16/20 1732 07/16/20 1953 07/16/20 2035 07/17/20 0732 07/17/20 1113  GLUCAP 323* 241* 214* 90 178*   D-Dimer: No results for input(s): DDIMER in the last 72 hours. Hgb A1c: No results for input(s): HGBA1C in the last 72 hours. Lipid Profile: No results for input(s): CHOL, HDL, LDLCALC, TRIG, CHOLHDL, LDLDIRECT in the last 72 hours. Thyroid function studies: No results for input(s): TSH, T4TOTAL, T3FREE, THYROIDAB in the last 72 hours.  Invalid input(s): FREET3 Anemia work up: No results for input(s): VITAMINB12, FOLATE, FERRITIN, TIBC, IRON, RETICCTPCT in the last 72 hours. Sepsis Labs: Recent Labs  Lab 07/14/20 0542 07/15/20 0359 07/16/20 0448 07/17/20 0446  WBC 10.3 9.5 8.2 8.4    Microbiology No results found for this or any previous visit (from the past 240 hour(s)).  Procedures and diagnostic studies:  No results found.             LOS: 27 days   [place] Copywriter, advertising on [email or web address]. If 7PM-7AM, please contact night-coverage at [email or web address]     07/17/2020, 3:21 PM

## 2020-07-17 NOTE — Progress Notes (Signed)
PT Cancellation Note  Patient Details Name: Scott Lawson. MRN: [id number] DOB: February 23, 1941   Cancelled Treatment:    Reason Eval/Treat Not Completed: Other (comment). Pt currently on bipap, not appropriate for PT at this time. Will re-attempt next date if medically stable.   Diaz Crago 07/17/2020, 2:54 PM Greggory Stallion, PT, DPT [phone]

## 2020-07-18 DIAGNOSIS — U071 COVID-19: Secondary | ICD-10-CM | POA: Diagnosis not present

## 2020-07-18 DIAGNOSIS — J1282 Pneumonia due to coronavirus disease 2019: Secondary | ICD-10-CM | POA: Diagnosis not present

## 2020-07-18 DIAGNOSIS — J9600 Acute respiratory failure, unspecified whether with hypoxia or hypercapnia: Secondary | ICD-10-CM | POA: Diagnosis not present

## 2020-07-18 LAB — RENAL FUNCTION PANEL
Albumin: 2.5 g/dL — ABNORMAL LOW (ref 3.5–5.0)
Anion gap: 9 (ref 5–15)
BUN: 35 mg/dL — ABNORMAL HIGH (ref 8–23)
CO2: 29 mmol/L (ref 22–32)
Calcium: 8.7 mg/dL — ABNORMAL LOW (ref 8.9–10.3)
Chloride: 97 mmol/L — ABNORMAL LOW (ref 98–111)
Creatinine, Ser: 1.22 mg/dL (ref 0.61–1.24)
GFR, Estimated: >60 mL/min (ref >60)
Glucose, Bld: 219 mg/dL — ABNORMAL HIGH (ref 70–99)
Phosphorus: 4 mg/dL (ref 2.5–4.6)
Potassium: 5 mmol/L (ref 3.5–5.1)
Sodium: 135 mmol/L (ref 135–145)

## 2020-07-18 LAB — CBC WITH DIFFERENTIAL/PLATELET
Abs Immature Granulocytes: 0.28 10*3/uL — ABNORMAL HIGH (ref 0.00–0.07)
Basophils Absolute: 0 10*3/uL (ref 0.0–0.1)
Basophils Relative: 0 %
Eosinophils Absolute: 0.5 10*3/uL (ref 0.0–0.5)
Eosinophils Relative: 5 %
HCT: 35.4 % — ABNORMAL LOW (ref 39.0–52.0)
Hemoglobin: 11.3 g/dL — ABNORMAL LOW (ref 13.0–17.0)
Immature Granulocytes: 3 %
Lymphocytes Relative: 12 %
Lymphs Abs: 1.3 10*3/uL (ref 0.7–4.0)
MCH: 29.4 pg (ref 26.0–34.0)
MCHC: 31.9 g/dL (ref 30.0–36.0)
MCV: 92.2 fL (ref 80.0–100.0)
Monocytes Absolute: 1.1 10*3/uL — ABNORMAL HIGH (ref 0.1–1.0)
Monocytes Relative: 10 %
Neutro Abs: 7.2 10*3/uL (ref 1.7–7.7)
Neutrophils Relative %: 70 %
Platelets: 232 10*3/uL (ref 150–400)
RBC: 3.84 MIL/uL — ABNORMAL LOW (ref 4.22–5.81)
RDW: 14.7 % (ref 11.5–15.5)
WBC: 10.3 10*3/uL (ref 4.0–10.5)
nRBC: 0 % (ref 0.0–0.2)

## 2020-07-18 LAB — GLUCOSE, CAPILLARY
Glucose-Capillary: 187 mg/dL — ABNORMAL HIGH (ref 70–99)
Glucose-Capillary: 169 mg/dL — ABNORMAL HIGH (ref 70–99)
Glucose-Capillary: 85 mg/dL (ref 70–99)
Glucose-Capillary: 87 mg/dL (ref 70–99)
Glucose-Capillary: 254 mg/dL — ABNORMAL HIGH (ref 70–99)

## 2020-07-18 LAB — MAGNESIUM: Magnesium: 1.9 mg/dL (ref 1.7–2.4)

## 2020-07-18 NOTE — Progress Notes (Signed)
Progress Note    Scott Lawson.  RKY:[id number] DOB: [birth year]  DOA: 06/20/2020 PCP: Venita Lick, NP      Brief Narrative:    Medical records reviewed and are as summarized below:  Scott Lawson. is a 80 y.o. male with medical history significant for hypertension, type II DM, BPH, hyperlipidemia, obesity, CAD, who presented to the hospital with generalized weakness, lightheadedness and increasing shortness of breath.  He was admitted to the hospital for [id number] pneumonia complicated by acute hypoxic respiratory failure.  He is unvaccinated.  He was treated with steroids, remdesivir and baricitinib.  He was treated with oxygen including oxygen via heated humidified high flow nasal cannula.  He had hypoglycemic episodes requiring adjustments in hypoglycemic regimen.  He developed a superficial wound on the left forearm.  He attributed this to infiltration of peripheral IV cannula.  Orthopedic surgery was consulted to evaluate the wound but there was no indication for surgery.  He was started on doxycycline for probable wound infection.  He developed worsening respiratory failure and CT scan of the chest showed severe bilateral airspace disease.  He also had worsening nocturnal hypoxemia requiring BiPAP.     Assessment/Plan:   Principal Problem:   Pneumonia due to COVID-19 virus Active Problems:   Acute renal failure superimposed on stage 3a chronic kidney disease (HCC)   Hyperglycemia due to type 2 diabetes mellitus (HCC)   Generalized weakness   Acute respiratory failure due to COVID-19 (HCC)   Obesity (BMI 30-39.9)   Body mass index is 30.14 kg/m.  (Obesity)   COVID-19 pneumonia: He completed remdesivir on 06/24/2020, baricitinib on 07/04/2020 on steroids 07/05/2020. He was restarted on steroids 07/10/2020.  Discontinue prednisone after tomorrow's dose.  Acute hypoxic respiratory failure: He is on 5 L/min oxygen via nasal cannula.  He has been using  CPAP at night. He has been advised to see a pulmonologist for sleep study as an outpatient to see if he qualifies for CPAP at night.  He does not have hypercapnia on ABG.  Type 2 DM with hyperglycemia: ContinueLinagliptin, Levemir and NovoLog.  Left forearm superficial wound with skin necrosis: Completed doxycycline.  He had been evaluated by orthopedic surgeon (on 07/09/2020) and there was no indication for surgery.  AKI and hyponatremia: Improved  Generalized weakness: PT recommends discharge to SNF.  Awaiting placement to SNF.   Diet Order            Diet regular Room service appropriate? Yes; Fluid consistency: Thin  Diet effective now                    Consultants:  Intensivist  Orthopedic surgeon  Procedures:  None    Medications:   . vitamin C  500 mg Oral Daily  . aspirin EC  81 mg Oral Daily  . atorvastatin  80 mg Oral Daily  . Chlorhexidine Gluconate Cloth  6 each Topical Daily  . heparin  5,000 Units Subcutaneous Q8H  . insulin aspart  0-20 Units Subcutaneous TID AC  . insulin aspart  0-5 Units Subcutaneous QHS  . insulin aspart  5 Units Subcutaneous TID WC  . insulin detemir  8 Units Subcutaneous BID  . linagliptin  5 mg Oral Daily  . mometasone-formoterol  2 puff Inhalation BID  . pantoprazole  40 mg Oral BID  . polyethylene glycol  17 g Oral Daily  . predniSONE  10 mg Oral Q breakfast  . senna-docusate  1 tablet Oral BID  . tamsulosin  0.4 mg Oral Daily  . zinc sulfate  220 mg Oral Daily   Continuous Infusions:   Anti-infectives (From admission, onward)   Start     Dose/Rate Route Frequency Ordered Stop   07/12/20 2200  doxycycline (VIBRA-TABS) tablet 100 mg        100 mg Oral Every 12 hours 07/12/20 1220 07/13/20 2105   07/10/20 1145  doxycycline (VIBRA-TABS) tablet 100 mg  Status:  Discontinued        100 mg Oral Every 12 hours 07/10/20 1048 07/12/20 1220   07/09/20 1200  doxycycline (VIBRA-TABS) tablet 100 mg  Status:  Discontinued         100 mg Oral Every 12 hours 07/09/20 1113 07/10/20 1045   06/21/20 1000  remdesivir 100 mg in sodium chloride 0.9 % 100 mL IVPB       "Followed by" Linked Group Details   100 mg 200 mL/hr over 30 Minutes Intravenous Daily 06/20/20 2100 06/24/20 1027   06/21/20 1000  remdesivir 100 mg in sodium chloride 0.9 % 100 mL IVPB  Status:  Discontinued       "Followed by" Linked Group Details   100 mg 200 mL/hr over 30 Minutes Intravenous Daily 06/20/20 2207 06/20/20 2217   06/20/20 2200  remdesivir 200 mg in sodium chloride 0.9% 250 mL IVPB  Status:  Discontinued       "Followed by" Linked Group Details   200 mg 580 mL/hr over 30 Minutes Intravenous Once 06/20/20 2207 06/20/20 2217   06/20/20 2115  remdesivir 200 mg in sodium chloride 0.9% 250 mL IVPB       "Followed by" Linked Group Details   200 mg 580 mL/hr over 30 Minutes Intravenous Once 06/20/20 2100 06/20/20 2333             Family Communication/Anticipated D/C date and plan/Code Status   DVT prophylaxis: heparin injection 5,000 Units Start: 07/13/20 1400 SCDs Start: 07/13/20 1039 Place and maintain sequential compression device Start: 07/11/20 1042     Code Status: Full Code  Family Communication: None Disposition Plan:    Status is: Inpatient  Remains inpatient appropriate because:Unsafe d/c plan and Inpatient level of care appropriate due to severity of illness   Dispo: The patient is from: Home              Anticipated d/c is to: SNF              Anticipated d/c date is: 1 day              Patient currently is medically stable to d/c.   Difficult to place patient Yes           Subjective:   No complaints.  He is feeling better.  He still prefers to go to SNF rather than go home.  Objective:    Vitals:   07/17/20 2200 07/18/20 0350 07/18/20 0808 07/18/20 1203  BP:  115/70 118/70 128/87  Pulse: 94 81 62 82  Resp: (!) 24 (!) 22 20 19   Temp:  98.2 F (36.8 C) 98.5 F (36.9 C) 98.1 F (36.7  C)  TempSrc:  Axillary Oral   SpO2: 96% 96% 99% 91%  Weight:  92.6 kg    Height:       No data found.   Intake/Output Summary (Last 24 hours) at 07/18/2020 1543 Last data filed at 07/18/2020 1330 Gross per 24 hour  Intake 720 ml  Output  1575 ml  Net -855 ml   Filed Weights   07/15/20 0355 07/17/20 0429 07/18/20 0350  Weight: 93.6 kg 93.1 kg 92.6 kg    Exam:  GEN: NAD SKIN: Superficial wound with eschar surrounded by area of erythema left anterior forearm. EYES: EOMI ENT: MMM CV: RRR PULM: CTA B ABD: soft, ND, NT, +BS CNS: AAO x 3, non focal EXT: No edema or tenderness      Data Reviewed:   I have personally reviewed following labs and imaging studies:  Labs: Labs show the following:   Basic Metabolic Panel: Recent Labs  Lab 07/14/20 0542 07/15/20 0359 07/16/20 0448 07/17/20 0446 07/18/20 0355  NA 136 133* 135 136 135  K 4.2 4.5 4.3 4.2 5.0  CL 98 95* 97* 97* 97*  CO2 25 28 29 29 29   GLUCOSE 72 225* 197* 86 219*  BUN 35* 30* 26* 23 35*  CREATININE 0.94 1.33* 1.12 1.10 1.22  CALCIUM 8.5* 8.5* 8.5* 8.5* 8.7*  MG 2.1 2.1 2.1 2.0 1.9  PHOS 3.4 3.3 3.4 3.6 4.0   GFR Estimated Creatinine Clearance: 55.2 mL/min (by C-G formula based on SCr of 1.22 mg/dL). Liver Function Tests: Recent Labs  Lab 07/14/20 0542 07/15/20 0359 07/16/20 0448 07/17/20 0446 07/18/20 0355  ALBUMIN 2.5* 2.6* 2.6* 2.6* 2.5*   No results for input(s): LIPASE, AMYLASE in the last 168 hours. No results for input(s): AMMONIA in the last 168 hours. Coagulation profile No results for input(s): INR, PROTIME in the last 168 hours.  CBC: Recent Labs  Lab 07/14/20 0542 07/15/20 0359 07/16/20 0448 07/17/20 0446 07/18/20 0355  WBC 10.3 9.5 8.2 8.4 10.3  NEUTROABS 7.2 6.2 5.1 5.0 7.2  HGB 11.7* 11.4* 11.5* 12.3* 11.3*  HCT 37.6* 36.1* 35.3* 38.3* 35.4*  MCV 94.2 92.1 91.0 91.4 92.2  PLT 207 229 226 240 232   Cardiac Enzymes: No results for input(s): CKTOTAL, CKMB,  CKMBINDEX, TROPONINI in the last 168 hours. BNP (last 3 results) No results for input(s): PROBNP in the last 8760 hours. CBG: Recent Labs  Lab 07/17/20 1113 07/17/20 1648 07/17/20 2020 07/18/20 0810 07/18/20 1206  GLUCAP 178* 286* 320* 187* 169*   D-Dimer: No results for input(s): DDIMER in the last 72 hours. Hgb A1c: No results for input(s): HGBA1C in the last 72 hours. Lipid Profile: No results for input(s): CHOL, HDL, LDLCALC, TRIG, CHOLHDL, LDLDIRECT in the last 72 hours. Thyroid function studies: No results for input(s): TSH, T4TOTAL, T3FREE, THYROIDAB in the last 72 hours.  Invalid input(s): FREET3 Anemia work up: No results for input(s): VITAMINB12, FOLATE, FERRITIN, TIBC, IRON, RETICCTPCT in the last 72 hours. Sepsis Labs: Recent Labs  Lab 07/15/20 0359 07/16/20 0448 07/17/20 0446 07/18/20 0355  WBC 9.5 8.2 8.4 10.3    Microbiology No results found for this or any previous visit (from the past 240 hour(s)).  Procedures and diagnostic studies:  No results found.             LOS: 28 days   [place] Copywriter, advertising on [email or web address]. If 7PM-7AM, please contact night-coverage at [email or web address]     07/18/2020, 3:43 PM

## 2020-07-18 NOTE — Progress Notes (Signed)
Physical Therapy Treatment Patient Details Name: Scott Lawson. MRN: [id number] DOB: [birth year] Today's Date: 07/18/2020    History of Present Illness presented to ER secondary to general malaise, lightheadedness and SOB; admitted for management of acute respiratory failure due to COVID-19, PNA    PT Comments    Pt is making gradual progress towards goals, continues to be limited by decreased endurance with quick de-sat. Still requires hands on assist for all mobility. INitially on bipap, educating patient that according to RN, only needs to wear it at night/while sleeping. RN called to room as pt arguing that he can't take it off. Pt agreeable to transition to [place] for mobility efforts. All mobility performed on 6L of O2, however still demonstrates quick de-sat with exertion with poor awareness. Takes extended time for recovery. Will continue to progress as able.   Follow Up Recommendations  SNF     Equipment Recommendations  None recommended by PT    Recommendations for Other Services       Precautions / Restrictions Precautions Precautions: Fall Restrictions Weight Bearing Restrictions: No    Mobility  Bed Mobility               General bed mobility comments: not performed as pt received in recliner  Transfers Overall transfer level: Needs assistance Equipment used: Rolling walker (2 wheeled) Transfers: Sit to/from Stand Sit to Stand: Min guard         General transfer comment: improved safety. UPright posture noted. All mobility performed on 6L of O2.  Ambulation/Gait Ambulation/Gait assistance: Min guard Gait Distance (Feet): 40 Feet Assistive device: Rolling walker (2 wheeled) Gait Pattern/deviations: Step-to pattern     General Gait Details: improved balance noted this date, however decreased endurance noted. O2 sats decrease to 69% with exertion on 6L, taking extended time to recover back to 85% and decreasing to 4.5L.   Stairs              Wheelchair Mobility    Modified Rankin (Stroke Patients Only)       Balance Overall balance assessment: Needs assistance Sitting-balance support: No upper extremity supported Sitting balance-Leahy Scale: Good     Standing balance support: Bilateral upper extremity supported Standing balance-Leahy Scale: Good                              Cognition Arousal/Alertness: Awake/alert Behavior During Therapy: Anxious Overall Cognitive Status: Within Functional Limits for tasks assessed                                 General Comments: became agitated during session, able to talk through and calm patient down. Improved behavior at end of session      Exercises Other Exercises Other Exercises: Seated ther-ex performed on B LE including LAQ, hip add squeezes, and alt marching. All ther-ex performed x 10 reps while on bipap with sats at 100%.    General Comments        Pertinent Vitals/Pain Pain Assessment: No/denies pain    Home Living                      Prior Function            PT Goals (current goals can now be found in the care plan section) Acute Rehab PT Goals Patient Stated Goal: to return home PT  Goal Formulation: With patient Time For Goal Achievement: 07/27/20 Potential to Achieve Goals: Fair Progress towards PT goals: Progressing toward goals    Frequency    Min 2X/week      PT Plan Current plan remains appropriate    Co-evaluation              AM-PAC PT "6 Clicks" Mobility   Outcome Measure  Help needed turning from your back to your side while in a flat bed without using bedrails?: None Help needed moving from lying on your back to sitting on the side of a flat bed without using bedrails?: A Little Help needed moving to and from a bed to a chair (including a wheelchair)?: A Little Help needed standing up from a chair using your arms (e.g., wheelchair or bedside chair)?: A Little Help needed to  walk in hospital room?: A Little Help needed climbing 3-5 steps with a railing? : A Lot 6 Click Score: 18    End of Session Equipment Utilized During Treatment: Gait belt;Oxygen Activity Tolerance: Patient tolerated treatment well Patient left: in chair;with call bell/phone within reach Nurse Communication: Mobility status PT Visit Diagnosis: Muscle weakness (generalized) (M62.81);Difficulty in walking, not elsewhere classified (R26.2)     Time: 1451-1520 PT Time Calculation (min) (ACUTE ONLY): 29 min  Charges:  $Gait Training: 8-22 mins $Therapeutic Exercise: 8-22 mins                     Scott Lawson, PT, DPT [phone]    Scott Lawson 07/18/2020, 4:51 PM

## 2020-07-18 NOTE — TOC Progression Note (Signed)
Transition of Care (TOC) - Progression Note    Patient Details  Name: Scott Lawson. MRN: [id number] Date of Birth: [birth year]  Transition of Care [facility]) CM/SW Contact  Eileen Stanford, LCSW Phone Number: 07/18/2020, 1:49 PM  Clinical Narrative: Pt is managed by regular [employer]. Miquel Dunn is starting [place].      Expected Discharge Plan: [place] Barriers to Discharge: Continued Medical Work up  Expected Discharge Plan and Services Expected Discharge Plan: [place] In-house Referral: Clinical Social Work   Post Acute Care Choice: [place] Living arrangements for the past 2 months: Single Family Home                                       Social Determinants of Health (SDOH) Interventions    Readmission Risk Interventions No flowsheet data found.

## 2020-07-18 NOTE — Care Management Important Message (Signed)
Important Message  Patient Details  Name: Scott Lawson. MRN: [id number] Date of Birth: 07-22-1940   Medicare Important Message Given:  Yes     Dannette Barbara 07/18/2020, 1:56 PM

## 2020-07-19 DIAGNOSIS — U071 COVID-19: Secondary | ICD-10-CM | POA: Diagnosis not present

## 2020-07-19 DIAGNOSIS — R531 Weakness: Secondary | ICD-10-CM | POA: Diagnosis not present

## 2020-07-19 DIAGNOSIS — J439 Emphysema, unspecified: Secondary | ICD-10-CM | POA: Diagnosis not present

## 2020-07-19 DIAGNOSIS — E1122 Type 2 diabetes mellitus with diabetic chronic kidney disease: Secondary | ICD-10-CM | POA: Diagnosis not present

## 2020-07-19 DIAGNOSIS — E785 Hyperlipidemia, unspecified: Secondary | ICD-10-CM | POA: Diagnosis not present

## 2020-07-19 DIAGNOSIS — R5381 Other malaise: Secondary | ICD-10-CM | POA: Diagnosis not present

## 2020-07-19 DIAGNOSIS — N183 Chronic kidney disease, stage 3 unspecified: Secondary | ICD-10-CM | POA: Diagnosis not present

## 2020-07-19 DIAGNOSIS — J9600 Acute respiratory failure, unspecified whether with hypoxia or hypercapnia: Secondary | ICD-10-CM | POA: Diagnosis not present

## 2020-07-19 DIAGNOSIS — M6281 Muscle weakness (generalized): Secondary | ICD-10-CM | POA: Diagnosis not present

## 2020-07-19 DIAGNOSIS — R279 Unspecified lack of coordination: Secondary | ICD-10-CM | POA: Diagnosis not present

## 2020-07-19 DIAGNOSIS — R197 Diarrhea, unspecified: Secondary | ICD-10-CM | POA: Diagnosis not present

## 2020-07-19 DIAGNOSIS — J1282 Pneumonia due to coronavirus disease 2019: Secondary | ICD-10-CM | POA: Diagnosis not present

## 2020-07-19 DIAGNOSIS — C61 Malignant neoplasm of prostate: Secondary | ICD-10-CM | POA: Diagnosis not present

## 2020-07-19 DIAGNOSIS — N19 Unspecified kidney failure: Secondary | ICD-10-CM | POA: Diagnosis not present

## 2020-07-19 DIAGNOSIS — I129 Hypertensive chronic kidney disease with stage 1 through stage 4 chronic kidney disease, or unspecified chronic kidney disease: Secondary | ICD-10-CM | POA: Diagnosis not present

## 2020-07-19 LAB — CBC WITH DIFFERENTIAL/PLATELET
Abs Immature Granulocytes: 0.29 10*3/uL — ABNORMAL HIGH (ref 0.00–0.07)
Basophils Absolute: 0 10*3/uL (ref 0.0–0.1)
Basophils Relative: 1 %
Eosinophils Absolute: 1 10*3/uL — ABNORMAL HIGH (ref 0.0–0.5)
Eosinophils Relative: 15 %
HCT: 34.4 % — ABNORMAL LOW (ref 39.0–52.0)
Hemoglobin: 11.2 g/dL — ABNORMAL LOW (ref 13.0–17.0)
Immature Granulocytes: 4 %
Lymphocytes Relative: 14 %
Lymphs Abs: 1 10*3/uL (ref 0.7–4.0)
MCH: 29.9 pg (ref 26.0–34.0)
MCHC: 32.6 g/dL (ref 30.0–36.0)
MCV: 92 fL (ref 80.0–100.0)
Monocytes Absolute: 0.7 10*3/uL (ref 0.1–1.0)
Monocytes Relative: 10 %
Neutro Abs: 3.8 10*3/uL (ref 1.7–7.7)
Neutrophils Relative %: 56 %
Platelets: 224 10*3/uL (ref 150–400)
RBC: 3.74 MIL/uL — ABNORMAL LOW (ref 4.22–5.81)
RDW: 15 % (ref 11.5–15.5)
WBC: 6.8 10*3/uL (ref 4.0–10.5)
nRBC: 0.3 % — ABNORMAL HIGH (ref 0.0–0.2)

## 2020-07-19 LAB — RENAL FUNCTION PANEL
Albumin: 2.8 g/dL — ABNORMAL LOW (ref 3.5–5.0)
Anion gap: 8 (ref 5–15)
BUN: 33 mg/dL — ABNORMAL HIGH (ref 8–23)
CO2: 30 mmol/L (ref 22–32)
Calcium: 8.5 mg/dL — ABNORMAL LOW (ref 8.9–10.3)
Chloride: 93 mmol/L — ABNORMAL LOW (ref 98–111)
Creatinine, Ser: 1.22 mg/dL (ref 0.61–1.24)
GFR, Estimated: >60 mL/min (ref >60)
Glucose, Bld: 319 mg/dL — ABNORMAL HIGH (ref 70–99)
Phosphorus: 4.3 mg/dL (ref 2.5–4.6)
Potassium: 4.8 mmol/L (ref 3.5–5.1)
Sodium: 131 mmol/L — ABNORMAL LOW (ref 135–145)

## 2020-07-19 LAB — GLUCOSE, CAPILLARY
Glucose-Capillary: 269 mg/dL — ABNORMAL HIGH (ref 70–99)
Glucose-Capillary: 326 mg/dL — ABNORMAL HIGH (ref 70–99)

## 2020-07-19 LAB — MAGNESIUM: Magnesium: 1.8 mg/dL (ref 1.7–2.4)

## 2020-07-19 MED ORDER — INSULIN LISPRO (1 UNIT DIAL) 100 UNIT/ML (KWIKPEN): 5.0000 [IU] | PEN_INJECTOR | Freq: Three times a day (TID) | SUBCUTANEOUS | 5 refills | Status: DC

## 2020-07-19 MED ORDER — INSULIN GLARGINE 100 UNIT/ML ~~LOC~~ SOLN: 15.0000 [IU] | Freq: Every evening | SUBCUTANEOUS | 11 refills | Status: DC

## 2020-07-19 NOTE — Plan of Care (Signed)

## 2020-07-19 NOTE — TOC Transition Note (Signed)
Transition of Care [facility]) - CM/SW Discharge Note   Patient Details  Name: Scott Lawson. MRN: [id number] Date of Birth: [birth year]  Transition of Care [facility]) CM/SW Contact:  Eileen Stanford, LCSW Phone Number: 07/19/2020, 12:34 PM   Clinical Narrative:   Clinical Social Worker facilitated patient discharge including contacting patient family and facility to confirm patient discharge plans.  Clinical information faxed to facility and family agreeable with plan.  CSW arranged ambulance transport via [facility] to [facility]  RN to call [phone] for report prior to discharge.      Final next level of care: Skilled Nursing Facility Barriers to Discharge: No Barriers Identified   Patient Goals and CMS Choice Patient states their goals for this hospitalization and ongoing recovery are:: to get better   Choice offered to / list presented to : [place]  Discharge Placement              Patient chooses bed at:  [facility]) Patient to be transferred to facility by:  ([facility]) Name of family member notified:  (daughter angel) Patient and family notified of of transfer: 07/19/20  Discharge Plan and Services In-house Referral: Clinical Social Work   Post Acute Care Choice: [place]                               Social Determinants of Health ([place]) Interventions     Readmission Risk Interventions No flowsheet data found.

## 2020-07-19 NOTE — TOC Progression Note (Addendum)
Transition of Care (TOC) - Progression Note    Patient Details  Name: Scott Lawson. MRN: [id number] Date of Birth: 09/12/1940  Transition of Care [facility]) CM/SW Contact  Eileen Stanford, LCSW Phone Number: 07/19/2020, 9:11 AM  Clinical Narrative:   Scott Lawson is able to get CPAP and has received insurance auth. MD notified.    Expected Discharge Plan: [place] Barriers to Discharge: Continued Medical Work up  Expected Discharge Plan and Services Expected Discharge Plan: [place] In-house Referral: Clinical Social Work   Post Acute Care Choice: [place] Living arrangements for the past 2 months: Single Family Home                                       Social Determinants of Health (SDOH) Interventions    Readmission Risk Interventions No flowsheet data found.

## 2020-07-19 NOTE — Discharge Summary (Signed)
Physician Discharge Summary  Scott Lawson. [id number] DOB: Mar 08, 1941 DOA: 06/20/2020  PCP: Venita Lick, NP  Admit date: 06/20/2020 Discharge date: 07/19/2020  Discharge disposition: Skilled nursing facility   Recommendations for Outpatient Follow-Up:   Follow-up with physician at the nursing home within 3 days of discharge. Follow-up with pulmonologist for a sleep study.   Discharge Diagnosis:   Principal Problem:   Pneumonia due to COVID-19 virus Active Problems:   Acute renal failure superimposed on stage 3a chronic kidney disease (HCC)   Hyperglycemia due to type 2 diabetes mellitus (HCC)   Generalized weakness   Acute respiratory failure due to COVID-19 (HCC)   Obesity (BMI 30-39.9)    Discharge Condition: Stable.  Diet recommendation:  Diet Order            Diet - low sodium heart healthy           Diet regular Room service appropriate? Yes; Fluid consistency: Thin  Diet effective now                   Code Status: Full Code     Hospital Course:   Scott Lawson. is a 80 y.o. malewith medical history significant for hypertension, type II DM, BPH, hyperlipidemia, obesity, CAD, who presented to the hospital with generalized weakness, lightheadedness and increasing shortness of breath.  He was admitted to the hospital for [id number] pneumonia complicated by acute hypoxic respiratory failure. He is unvaccinated. He was treated with steroids, remdesivir and baricitinib. He was treated with oxygen including oxygen via heated humidified high flow nasal cannula. He had hypoglycemic episodes requiring adjustments in hypoglycemic regimen.  He developed a superficial wound on the left forearm. He attributed this to infiltration of peripheral IV cannula. Orthopedic surgery was consulted to evaluate the wound but there was no indication for surgery. He was started on doxycycline for probable wound infection.  He developed worsening  respiratory failure and CT scan of the chest showed severe bilateral airspace disease.  He also had worsening nocturnal hypoxemia requiring CPAP.  He was restarted on steroids.  His condition has improved.  However, he could not be weaned off of oxygen completely.  He will be discharged on 5 L/min oxygen via nasal cannula for chronic hypoxic respiratory failure.  Attempts should be made to taper down or taper off oxygen as able.  He was evaluated by PT and OT recommended further rehabilitation at a skilled nursing facility.   Medical Consultants:    [place]  Orthopedic surgeon   Discharge Exam:    Vitals:   07/19/20 0200 07/19/20 0501 07/19/20 0806 07/19/20 1127  BP:  120/63 114/61 (!) 117/59  Pulse: 74 72 80 85  Resp: 18 18 20 16   Temp:  98.7 F (37.1 C) (!) 97.4 F (36.3 C) 97.7 F (36.5 C)  TempSrc:    Oral  SpO2: 99% 99% 98% 97%  Weight:      Height:         GEN: NAD SKIN: Superficial wound with eschar surrounded by area of erythema on the left anterior forearm. EYES: No pallor or icterus ENT: MMM CV: RRR PULM: CTA B ABD: soft, ND, NT, +BS CNS: AAO x 3, non focal EXT: No edema or tenderness   The results of significant diagnostics from this hospitalization (including imaging, microbiology, ancillary and laboratory) are listed below for reference.     Procedures and Diagnostic Studies:   DG Chest [facility] 1 View  Result  Date: 06/24/2020 CLINICAL DATA:  Shortness of breath EXAM: PORTABLE CHEST 1 VIEW COMPARISON:  Yesterday FINDINGS: Cardiopericardial enlargement. Diffuse interstitial opacity and hazy airspace density. No visible effusion or pneumothorax. IMPRESSION: Stable low volume chest with diffuse infectious or congestive opacity. Electronically Signed   By: Monte Fantasia M.D.   On: 06/24/2020 05:48   DG Chest Port 1 View  Result Date: 06/23/2020 CLINICAL DATA:  Acute respiratory failure.  COVID. EXAM: PORTABLE CHEST 1 VIEW COMPARISON:  Three days ago  FINDINGS: Cardiomegaly. Low lung volumes accentuated by apical lordotic positioning. Diffuse pulmonary opacification. No visible effusion or air leak. IMPRESSION: Stable low volume chest with diffuse opacification. There is cardiomegaly and symmetric opacity, edema could contribute to the presumed COVID pneumonia. Electronically Signed   By: Monte Fantasia M.D.   On: 06/23/2020 07:18   ECHOCARDIOGRAM COMPLETE  Result Date: 06/23/2020    ECHOCARDIOGRAM REPORT   Patient Name:   Scott Lawson. Date of Exam: 06/23/2020 Medical Rec #:  [id number]             Height:       69.0 in Accession #:    [id number]            Weight:       214.9 lb Date of Birth:  [birth year]             BSA:          2.130 m Patient Age:    9 years              BP:           139/68 mmHg Patient Gender: M                     HR:           58 bpm. Exam Location:  [facility] Procedure: 2D Echo Indications:     CHF-acute systolic [id number]  History:         Patient has no prior history of Echocardiogram examinations.                  Risk Factors:Diabetes, Dyslipidemia and Hypertension.  Sonographer:     Avanell Shackleton Referring Phys:  [id number] Marshell Garfinkel Diagnosing Phys: Ida Rogue MD IMPRESSIONS  1. Left ventricular ejection fraction, by estimation, is 60 to 65%. The left ventricle has normal function. The left ventricle has no regional wall motion abnormalities. Left ventricular diastolic parameters are consistent with Grade II diastolic dysfunction (pseudonormalization).  2. Right ventricular systolic function is normal. The right ventricular size is normal.  3. Left atrial size was moderate to severely dilated.  4. The mitral valve is normal in structure. No evidence of mitral valve regurgitation. No evidence of mitral stenosis. FINDINGS  Left Ventricle: Left ventricular ejection fraction, by estimation, is 60 to 65%. The left ventricle has normal function. The left ventricle has no regional wall motion abnormalities. The left ventricular  internal cavity size was normal in size. There is  no left ventricular hypertrophy. Left ventricular diastolic parameters are consistent with Grade II diastolic dysfunction (pseudonormalization). Right Ventricle: The right ventricular size is normal. No increase in right ventricular wall thickness. Right ventricular systolic function is normal. Left Atrium: Left atrial size was moderate to severely dilated. Right Atrium: Right atrial size was normal in size. Pericardium: There is no evidence of pericardial effusion. Mitral Valve: The mitral valve is normal in structure. No evidence of mitral valve regurgitation. No evidence  of mitral valve stenosis. Tricuspid Valve: The tricuspid valve is normal in structure. Tricuspid valve regurgitation is mild . No evidence of tricuspid stenosis. Aortic Valve: The aortic valve is normal in structure. Aortic valve regurgitation is not visualized. Mild to moderate aortic valve sclerosis/calcification is present, without any evidence of aortic stenosis. Pulmonic Valve: The pulmonic valve was normal in structure. Pulmonic valve regurgitation is not visualized. No evidence of pulmonic stenosis. Aorta: The aortic root is normal in size and structure. Venous: The inferior vena cava is normal in size with greater than 50% respiratory variability, suggesting right atrial pressure of 3 mmHg. IAS/Shunts: No atrial level shunt detected by color flow Doppler.  LEFT VENTRICLE PLAX 2D LVIDd:         4.47 cm  Diastology LVIDs:         2.69 cm  LV e' medial:   4.35 cm/s LV PW:         1.24 cm  LV E/e' medial: 26.7 LV IVS:        1.18 cm LVOT diam:     2.00 cm LVOT Area:     3.14 cm  RIGHT VENTRICLE             IVC RV S prime:     16.20 cm/s  IVC diam: 1.40 cm TAPSE (M-mode): 1.9 cm LEFT ATRIUM              Index LA diam:        5.80 cm  2.72 cm/m LA Vol (A2C):   107.0 ml 50.23 ml/m LA Vol (A4C):   89.6 ml  42.06 ml/m LA Biplane Vol: 98.7 ml  46.33 ml/m   AORTA Ao Root diam: 3.70 cm MITRAL  VALVE MV Area (PHT): 3.77 cm     SHUNTS MV Decel Time: 201 msec     Systemic Diam: 2.00 cm MV E velocity: 116.00 cm/s MV A velocity: 98.30 cm/s MV E/A ratio:  1.18 Ida Rogue MD Electronically signed by Ida Rogue MD Signature Date/Time: 06/23/2020/6:23:53 PM    Final      Labs:   Basic Metabolic Panel: Recent Labs  Lab 07/15/20 0359 07/16/20 0448 07/17/20 0446 07/18/20 0355 07/19/20 0400  NA 133* 135 136 135 131*  K 4.5 4.3 4.2 5.0 4.8  CL 95* 97* 97* 97* 93*  CO2 28 29 29 29 30   GLUCOSE 225* 197* 86 219* 319*  BUN 30* 26* 23 35* 33*  CREATININE 1.33* 1.12 1.10 1.22 1.22  CALCIUM 8.5* 8.5* 8.5* 8.7* 8.5*  MG 2.1 2.1 2.0 1.9 1.8  PHOS 3.3 3.4 3.6 4.0 4.3   GFR Estimated Creatinine Clearance: 55.2 mL/min (by C-G formula based on SCr of 1.22 mg/dL). Liver Function Tests: Recent Labs  Lab 07/15/20 0359 07/16/20 0448 07/17/20 0446 07/18/20 0355 07/19/20 0400  ALBUMIN 2.6* 2.6* 2.6* 2.5* 2.8*   No results for input(s): LIPASE, AMYLASE in the last 168 hours. No results for input(s): AMMONIA in the last 168 hours. Coagulation profile No results for input(s): INR, PROTIME in the last 168 hours.  CBC: Recent Labs  Lab 07/15/20 0359 07/16/20 0448 07/17/20 0446 07/18/20 0355 07/19/20 0400  WBC 9.5 8.2 8.4 10.3 6.8  NEUTROABS 6.2 5.1 5.0 7.2 3.8  HGB 11.4* 11.5* 12.3* 11.3* 11.2*  HCT 36.1* 35.3* 38.3* 35.4* 34.4*  MCV 92.1 91.0 91.4 92.2 92.0  PLT 229 226 240 232 224   Cardiac Enzymes: No results for input(s): CKTOTAL, CKMB, CKMBINDEX, TROPONINI in the last 168 hours.  BNP: Invalid input(s): POCBNP CBG: Recent Labs  Lab 07/18/20 1556 07/18/20 1557 07/18/20 2018 07/19/20 0808 07/19/20 1128  GLUCAP 87 85 254* 269* 326*   D-Dimer No results for input(s): DDIMER in the last 72 hours. Hgb A1c No results for input(s): HGBA1C in the last 72 hours. Lipid Profile No results for input(s): CHOL, HDL, LDLCALC, TRIG, CHOLHDL, LDLDIRECT in the last 72  hours. Thyroid function studies No results for input(s): TSH, T4TOTAL, T3FREE, THYROIDAB in the last 72 hours.  Invalid input(s): FREET3 Anemia work up No results for input(s): VITAMINB12, FOLATE, FERRITIN, TIBC, IRON, RETICCTPCT in the last 72 hours. Microbiology No results found for this or any previous visit (from the past 240 hour(s)).   Discharge Instructions:   Discharge Instructions    Diet - low sodium heart healthy   Complete by: As directed    Discharge wound care:   Complete by: As directed    Cleanse left forearm wound with normal saline and pat dry.  Apply Vaseline gauze to wound bed 6 of them.  Cover with dry gauze and kerlix.  Change dressing Mondays and Thursdays.   Increase activity slowly   Complete by: As directed      Allergies as of 07/19/2020      Reactions   Gabapentin Other (See Comments)   "made me fall out"      Medication List    TAKE these medications   aspirin 81 MG tablet Take 81 mg by mouth daily.   atorvastatin 80 MG tablet Commonly known as: LIPITOR Take 1 tablet (80 mg total) by mouth daily.   fluticasone 50 MCG/ACT nasal spray Commonly known as: FLONASE Place 2 sprays into both nostrils daily as needed.   insulin glargine 100 UNIT/ML injection Commonly known as: LANTUS Inject 0.15 mLs (15 Units total) into the skin every evening. 1800 What changed: how much to take   insulin lispro 100 UNIT/ML KwikPen Commonly known as: HumaLOG KwikPen Inject 5 Units into the skin 3 (three) times daily before meals. Continue to check blood sugar after meals. What changed: how much to take   lisinopril 5 MG tablet Commonly known as: ZESTRIL Take 1 tablet (5 mg total) by mouth daily.   meloxicam 15 MG tablet Commonly known as: MOBIC Take 15 mg by mouth daily.   metFORMIN 500 MG tablet Commonly known as: GLUCOPHAGE Take 2 tablets (1,000 mg total) by mouth 2 (two) times daily with a meal. 1000mg  in the morning 1000mg  at night   omeprazole  20 MG capsule Commonly known as: PRILOSEC TAKE 1 CAPSULE EVERY DAY   tamsulosin 0.4 MG Caps capsule Commonly known as: FLOMAX TAKE 1 CAPSULE EVERY DAY   True Metrix Blood Glucose Test test strip Generic drug: glucose blood CHECK BLOOD SUGAR TWICE DAILY   TRUEplus Lancets 33G Misc CHECK BLOOD SUGAR TWICE DAILY            Discharge Care Instructions  (From admission, onward)         Start     Ordered   07/19/20 0000  Discharge wound care:       Comments: Cleanse left forearm wound with normal saline and pat dry.  Apply Vaseline gauze to wound bed 6 of them.  Cover with dry gauze and kerlix.  Change dressing Mondays and Thursdays.   07/19/20 1154          Contact information for after-discharge care    Destination    [facility] .  Service: Skilled Nursing Contact information: [address] [place] [phone]                   Time coordinating discharge: 31 minutes  Signed:  Jennye Boroughs  [facility] 07/19/2020, 11:54 AM   Pager on [email or web address]. If 7PM-7AM, please contact night-coverage at [email or web address]

## 2020-07-22 DIAGNOSIS — U071 COVID-19: Secondary | ICD-10-CM | POA: Diagnosis not present

## 2020-07-22 DIAGNOSIS — R5381 Other malaise: Secondary | ICD-10-CM | POA: Diagnosis not present

## 2020-07-22 DIAGNOSIS — J9600 Acute respiratory failure, unspecified whether with hypoxia or hypercapnia: Secondary | ICD-10-CM | POA: Diagnosis not present

## 2020-07-22 DIAGNOSIS — J1282 Pneumonia due to coronavirus disease 2019: Secondary | ICD-10-CM | POA: Diagnosis not present

## 2020-07-24 ENCOUNTER — Telehealth: Payer: Self-pay

## 2020-07-24 DIAGNOSIS — J9600 Acute respiratory failure, unspecified whether with hypoxia or hypercapnia: Secondary | ICD-10-CM | POA: Diagnosis not present

## 2020-07-24 NOTE — Telephone Encounter (Signed)
Copied from [place] [id number]. Topic: General - Inquiry >> Jul 24, 2020  3:15 PM Yvette Rack wrote: Reason for CRM: Pt stated he is currently in the hospital and he needs either Jolene or her nurse to call him back at [phone]  or at his daughter [name] # [phone] to discuss information that needs to be passed to his family.

## 2020-07-24 NOTE — Telephone Encounter (Signed)
Vm not set up unable to lvm to receive clarification of this message.

## 2020-07-25 DIAGNOSIS — R197 Diarrhea, unspecified: Secondary | ICD-10-CM | POA: Diagnosis not present

## 2020-07-25 DIAGNOSIS — J9600 Acute respiratory failure, unspecified whether with hypoxia or hypercapnia: Secondary | ICD-10-CM | POA: Diagnosis not present

## 2020-07-29 DIAGNOSIS — I129 Hypertensive chronic kidney disease with stage 1 through stage 4 chronic kidney disease, or unspecified chronic kidney disease: Secondary | ICD-10-CM | POA: Diagnosis not present

## 2020-07-29 DIAGNOSIS — E1122 Type 2 diabetes mellitus with diabetic chronic kidney disease: Secondary | ICD-10-CM | POA: Diagnosis not present

## 2020-07-29 DIAGNOSIS — E785 Hyperlipidemia, unspecified: Secondary | ICD-10-CM | POA: Diagnosis not present

## 2020-07-29 DIAGNOSIS — N183 Chronic kidney disease, stage 3 unspecified: Secondary | ICD-10-CM | POA: Diagnosis not present

## 2020-07-30 DIAGNOSIS — N183 Chronic kidney disease, stage 3 unspecified: Secondary | ICD-10-CM | POA: Diagnosis not present

## 2020-07-30 DIAGNOSIS — U071 COVID-19: Secondary | ICD-10-CM | POA: Diagnosis not present

## 2020-07-30 DIAGNOSIS — C61 Malignant neoplasm of prostate: Secondary | ICD-10-CM | POA: Diagnosis not present

## 2020-07-31 ENCOUNTER — Other Ambulatory Visit: Payer: Self-pay

## 2020-07-31 ENCOUNTER — Other Ambulatory Visit: Payer: Self-pay | Admitting: Nurse Practitioner

## 2020-07-31 NOTE — Patient Outreach (Signed)
[facility]) Care Management  07/31/2020  Scott Bruins Carreras Jr. [birth year] [id number]     Transition of Care Referral  Referral Date:07/31/2020  Referral Source: [employer] Discharge Report Date of Discharge: 07/30/2020 Facility: [place]: [facility]   Referral received. Transition of care calls being completed via EMMI-automated calls. RN CM will outreach patient for any red flags received.      Plan: RN CM will close case at this time.   [name], RN,BSN,CCM [place] Management Telephonic Care Management Coordinator Direct Phone: [phone] Toll Free: [phone] Fax: [phone]

## 2020-08-01 ENCOUNTER — Telehealth: Payer: Self-pay

## 2020-08-01 ENCOUNTER — Ambulatory Visit: Payer: Self-pay | Admitting: Nurse Practitioner

## 2020-08-01 NOTE — Telephone Encounter (Signed)
He is scheduled to see my on the 21st, could anyone see him this afternoon?  Would benefit sooner visit and daughter should come to visit.

## 2020-08-01 NOTE — Telephone Encounter (Signed)
Copied from [place] [id number]. Topic: General - Other >> Aug 01, 2020  3:25 PM Yvette Rack wrote: Reason for CRM: Pt daughter Vaughan Basta stated she spoke with someone this morning to provide information about an inhaler for the pt and she requested that the nurse call her back. Linda requests call back. Advised that we are unable to provide her with any information due to her not being listed on DPR.

## 2020-08-01 NOTE — Telephone Encounter (Signed)
Pt scheduled for 08/02/2020 as he can not come sooner as her has no ride per PCP ok.

## 2020-08-01 NOTE — Telephone Encounter (Signed)
Pt's daughter 'Vaughan Basta' calling, pt present. Pt hospitalized 06/20/20 Covid related pneumonia.  States pt home from [facility] Tuesday, "Had good day yesterday, last night coughed all night." States non-productive. Reports "Felt clammy and had chills last night but none of that today and cough better."  Reports was to have inhaler and cough medicine sent in to pharmacy from [facility], has not been received by pharmacy. Also reports on home O2. "Was to have CPAP and mask for O2 at night. Mask is broken and sleep study appt not until March." Reports "Bumped up O2 at night to 6-7ls,"  nasal cannula, has no way to check O2 sat. O2 presently at 4ls.   States pt is not SOB but c/o "Right side of chest feels funny." Denies pain "Maybe tightness."  States has called [employer] multiple times "No response or get hung up on." States also confused regarding all current medications post hospital stay. NT called practice , Collene Mares, advised to send summary. Please advise: CB# [phone]  Reason for Disposition . Nursing judgment or information in reference  Answer Assessment - Initial Assessment Questions 1. REASON FOR CALL: "What is your main concern right now?"     Please see summary 2. ONSET: "When did the *No Answer* start?"     *No Answer* 3. SEVERITY: "How bad is the *No Answer*?"     *No Answer* 4. FEVER: "Do you have a fever?"     *No Answer* 5. OTHER SYMPTOMS: "Do you have any other new symptoms?"     *No Answer* 6. INTERVENTIONS AND RESPONSE: "What have you done so far to try to make this better? What medications have you used?"     *No Answer* 7. PREGNANCY: "Is there any chance you are pregnant?"     *No Answer*  Protocols used: NO GUIDELINE AVAILABLE-A-AH

## 2020-08-02 ENCOUNTER — Telehealth: Payer: Self-pay | Admitting: Nurse Practitioner

## 2020-08-02 ENCOUNTER — Other Ambulatory Visit: Payer: Self-pay

## 2020-08-02 ENCOUNTER — Ambulatory Visit (INDEPENDENT_AMBULATORY_CARE_PROVIDER_SITE_OTHER): Payer: Medicare HMO | Admitting: Nurse Practitioner

## 2020-08-02 ENCOUNTER — Encounter: Payer: Self-pay | Admitting: Nurse Practitioner

## 2020-08-02 VITALS — BP 134/82 | HR 116 | Temp 98.6°F | Wt 204.0 lb

## 2020-08-02 DIAGNOSIS — J1282 Pneumonia due to coronavirus disease 2019: Secondary | ICD-10-CM | POA: Diagnosis not present

## 2020-08-02 DIAGNOSIS — R0902 Hypoxemia: Secondary | ICD-10-CM | POA: Diagnosis not present

## 2020-08-02 DIAGNOSIS — I517 Cardiomegaly: Secondary | ICD-10-CM | POA: Diagnosis not present

## 2020-08-02 DIAGNOSIS — I1310 Hypertensive heart and chronic kidney disease without heart failure, with stage 1 through stage 4 chronic kidney disease, or unspecified chronic kidney disease: Secondary | ICD-10-CM

## 2020-08-02 DIAGNOSIS — N1831 Hypertensive heart and chronic kidney disease without heart failure, with stage 1 through stage 4 chronic kidney disease, or unspecified chronic kidney disease: Secondary | ICD-10-CM

## 2020-08-02 DIAGNOSIS — D649 Anemia, unspecified: Secondary | ICD-10-CM | POA: Diagnosis not present

## 2020-08-02 DIAGNOSIS — I444 Left anterior fascicular block: Secondary | ICD-10-CM | POA: Diagnosis not present

## 2020-08-02 DIAGNOSIS — R0609 Other forms of dyspnea: Secondary | ICD-10-CM | POA: Diagnosis not present

## 2020-08-02 DIAGNOSIS — E785 Hyperlipidemia, unspecified: Secondary | ICD-10-CM | POA: Diagnosis not present

## 2020-08-02 DIAGNOSIS — E119 Type 2 diabetes mellitus without complications: Secondary | ICD-10-CM | POA: Diagnosis not present

## 2020-08-02 DIAGNOSIS — R0789 Other chest pain: Secondary | ICD-10-CM | POA: Diagnosis not present

## 2020-08-02 DIAGNOSIS — U071 COVID-19: Secondary | ICD-10-CM | POA: Diagnosis not present

## 2020-08-02 DIAGNOSIS — R0602 Shortness of breath: Secondary | ICD-10-CM | POA: Diagnosis not present

## 2020-08-02 DIAGNOSIS — E1122 Type 2 diabetes mellitus with diabetic chronic kidney disease: Secondary | ICD-10-CM

## 2020-08-02 DIAGNOSIS — S41102D Unspecified open wound of left upper arm, subsequent encounter: Secondary | ICD-10-CM | POA: Diagnosis not present

## 2020-08-02 DIAGNOSIS — E1165 Type 2 diabetes mellitus with hyperglycemia: Secondary | ICD-10-CM

## 2020-08-02 DIAGNOSIS — R0600 Dyspnea, unspecified: Secondary | ICD-10-CM | POA: Diagnosis not present

## 2020-08-02 DIAGNOSIS — E1169 Type 2 diabetes mellitus with other specified complication: Secondary | ICD-10-CM | POA: Diagnosis not present

## 2020-08-02 DIAGNOSIS — J984 Other disorders of lung: Secondary | ICD-10-CM | POA: Diagnosis not present

## 2020-08-02 DIAGNOSIS — Z8616 Personal history of COVID-19: Secondary | ICD-10-CM | POA: Diagnosis not present

## 2020-08-02 DIAGNOSIS — I288 Other diseases of pulmonary vessels: Secondary | ICD-10-CM | POA: Diagnosis not present

## 2020-08-02 DIAGNOSIS — Z23 Encounter for immunization: Secondary | ICD-10-CM | POA: Diagnosis not present

## 2020-08-02 DIAGNOSIS — Z8701 Personal history of pneumonia (recurrent): Secondary | ICD-10-CM | POA: Diagnosis not present

## 2020-08-02 DIAGNOSIS — R918 Other nonspecific abnormal finding of lung field: Secondary | ICD-10-CM | POA: Diagnosis not present

## 2020-08-02 DIAGNOSIS — J8410 Pulmonary fibrosis, unspecified: Secondary | ICD-10-CM | POA: Diagnosis not present

## 2020-08-02 DIAGNOSIS — R000 Tachycardia, unspecified: Secondary | ICD-10-CM | POA: Diagnosis not present

## 2020-08-02 DIAGNOSIS — J9601 Acute respiratory failure with hypoxia: Secondary | ICD-10-CM | POA: Diagnosis not present

## 2020-08-02 DIAGNOSIS — IMO0002 Reserved for concepts with insufficient information to code with codable children: Secondary | ICD-10-CM

## 2020-08-02 DIAGNOSIS — J479 Bronchiectasis, uncomplicated: Secondary | ICD-10-CM | POA: Diagnosis not present

## 2020-08-02 LAB — BAYER DCA HB A1C WAIVED: HB A1C (BAYER DCA - WAIVED): 8.1 % — ABNORMAL HIGH (ref <7.0)

## 2020-08-02 NOTE — Telephone Encounter (Signed)
Daughter [name] calling and wants the dr to call her and let her know what  is going on with the pt, because she doesn't know and pt could not tell her   cb [phone]  She said he is currently at [facility] emergency room.

## 2020-08-02 NOTE — Progress Notes (Signed)
Established Patient Office Visit  Subjective:  Patient ID: Scott Roof., male    DOB: 1940/08/29  Age: 80 y.o. MRN: [id number]  CC:  Chief Complaint  Patient presents with  . Hospitalization Follow-up    Pt has 02 on 6 liters and is having a hard time breathing Patients daughter called this morning about an inhaler, and is demanding that is O2 machine at work is fixed today.     HPI Scott Roof. presents for follow-up after hospitalization. Was discharged from [facility] and went to [facility].  He was stated that he was supposed to have humidification for oxygen, a pulse oximeter, and an inhaler to help with breathing when he got home from [facility], however he never received any of these. He has a sleep study set up, was given a non-rebreather for night-time use and a piece came off it. Currently he is on 6L of oxygen.   Overall, he states his breathing is improved from hospitalization. He uses 8L oxygen when walking around at home. Currently on 6L here.   Has not been checking his blood sugar since he came home from the nursing facility. Currently staying with his oldest daughter, Scott Lawson. He said it is ok to call her with any information. He thinks he went home Friday 07/26/20 and currently has home health coming to the house.    Past Medical History:  Diagnosis Date  . BPH (benign prostatic hyperplasia)   . Diabetes mellitus without complication ([place])   . Elevated PSA   . GERD (gastroesophageal reflux disease)   . Hypertension   . Hypertriglyceridemia   . Nocturia     Past Surgical History:  Procedure Laterality Date  . APPENDECTOMY    . CARPAL TUNNEL RELEASE Left 12/11/2015   Procedure: CARPAL TUNNEL RELEASE;  Surgeon: Earnestine Leys, MD;  Location: [facility] ORS;  Service: Orthopedics;  Laterality: Left;  . EYE SURGERY     laser  . TRANSURETHRAL RESECTION OF PROSTATE N/A 08/02/2017   Procedure: TRANSURETHRAL RESECTION OF THE PROSTATE (TURP);  Surgeon:  Hollice Espy, MD;  Location: [facility] ORS;  Service: Urology;  Laterality: N/A;    Family History  Problem Relation Age of Onset  . Cancer Mother        lung  . Heart disease Father   . Diabetes Brother   . Prostate cancer Neg Hx   . Bladder Cancer Neg Hx   . Kidney cancer Neg Hx     Social History   Socioeconomic History  . Marital status: Married    Spouse name: Not on file  . Number of children: Not on file  . Years of education: Not on file  . Highest education level: High school graduate  Occupational History  . Occupation: retired  Tobacco Use  . Smoking status: Never Smoker  . Smokeless tobacco: Never Used  Vaping Use  . Vaping Use: Never used  Substance and Sexual Activity  . Alcohol use: No    Alcohol/week: 0.0 standard drinks  . Drug use: No  . Sexual activity: Yes  Other Topics Concern  . Not on file  Social History Narrative  . Not on file   Social Determinants of Health   Financial Resource Strain: Medium Risk  . Difficulty of Paying Living Expenses: Somewhat hard  Food Insecurity: No Food Insecurity  . Worried About Charity fundraiser in the Last Year: Never true  . Ran Out of Food in the Last Year: Never  true  Transportation Needs: No Transportation Needs  . Lack of Transportation (Medical): No  . Lack of Transportation (Non-Medical): No  Physical Activity: Inactive  . Days of Exercise per Week: 0 days  . Minutes of Exercise per Session: 0 min  Stress: Not on file  Social Connections: Socially Isolated  . Frequency of Communication with Friends and Family: More than three times a week  . Frequency of Social Gatherings with Friends and Family: More than three times a week  . Attends Religious Services: Never  . Active Member of Clubs or Organizations: No  . Attends Archivist Meetings: Never  . Marital Status: Widowed  Intimate Partner Violence: Not on file    Outpatient Medications Prior to Visit  Medication Sig Dispense Refill   . atorvastatin (LIPITOR) 80 MG tablet Take 1 tablet (80 mg total) by mouth daily. 90 tablet 3  . fluticasone (FLONASE) 50 MCG/ACT nasal spray Place 2 sprays into both nostrils daily as needed.     . insulin glargine (LANTUS) 100 UNIT/ML injection Inject 0.15 mLs (15 Units total) into the skin every evening. 1800 10 mL 11  . insulin lispro (HUMALOG KWIKPEN) 100 UNIT/ML KwikPen Inject 5 Units into the skin 3 (three) times daily before meals. Continue to check blood sugar after meals. 15 mL 5  . meloxicam (MOBIC) 15 MG tablet Take 15 mg by mouth daily.    . metFORMIN (GLUCOPHAGE) 500 MG tablet Take 2 tablets (1,000 mg total) by mouth 2 (two) times daily with a meal. 1000mg  in the morning 1000mg  at night 360 tablet 4  . omeprazole (PRILOSEC) 20 MG capsule TAKE 1 CAPSULE EVERY DAY 90 capsule 3  . tamsulosin (FLOMAX) 0.4 MG CAPS capsule TAKE 1 CAPSULE EVERY DAY 90 capsule 2  . TRUE METRIX BLOOD GLUCOSE TEST test strip CHECK BLOOD SUGAR TWICE DAILY 200 strip 3  . TRUEplus Lancets 33G MISC CHECK BLOOD SUGAR TWICE DAILY 200 each 4  . aspirin 81 MG tablet Take 81 mg by mouth daily.  (Patient not taking: Reported on 08/02/2020)    . lisinopril (ZESTRIL) 5 MG tablet Take 1 tablet (5 mg total) by mouth daily. (Patient not taking: Reported on 08/02/2020) 90 tablet 4   No facility-administered medications prior to visit.    Allergies  Allergen Reactions  . Gabapentin Other (See Comments)    "made me fall out"    ROS Review of Systems  Constitutional: Negative for fatigue and fever.  HENT: Negative.   Eyes: Negative.   Respiratory: Positive for cough (dry), chest tightness and shortness of breath.   Cardiovascular: Negative for chest pain.  Gastrointestinal: Positive for abdominal pain (acid reflux).  Genitourinary: Positive for difficulty urinating.  Skin: Positive for wound (left forearm from IV infiltration in hospital).  Neurological: Negative.   Psychiatric/Behavioral: Negative.        Objective:    Physical Exam Vitals and nursing note reviewed.  Constitutional:      General: He is in acute distress.  HENT:     Head: Normocephalic.     Right Ear: Tympanic membrane, ear canal and external ear normal.     Left Ear: Hearing, ear canal and external ear normal. A middle ear effusion is present.  Eyes:     Conjunctiva/sclera: Conjunctivae normal.  Cardiovascular:     Rate and Rhythm: Regular rhythm. Tachycardia present.     Pulses: Normal pulses.     Heart sounds: Normal heart sounds.  Pulmonary:     Effort:  Tachypnea (unable to talk in complete sentences on 6L oxygen) and respiratory distress present.     Breath sounds: Examination of the right-upper field reveals decreased breath sounds. Examination of the left-upper field reveals decreased breath sounds. Examination of the right-lower field reveals rales. Examination of the left-lower field reveals rales. Decreased breath sounds and rales present.  Abdominal:     Palpations: Abdomen is soft.     Tenderness: There is no abdominal tenderness.  Musculoskeletal:     Cervical back: Normal range of motion.     Right lower leg: No edema.     Left lower leg: No edema.  Skin:    Comments: Wound to left forearm with thick eschar and surrounding erythema.  Neurological:     General: No focal deficit present.     Mental Status: He is alert and oriented to person, place, and time.  Psychiatric:        Mood and Affect: Mood normal.        Behavior: Behavior normal.     BP 134/82   Pulse (!) 116   Temp 98.6 F (37 C) (Oral)   Wt 204 lb (92.5 kg)   SpO2 97%   BMI 30.13 kg/m  Wt Readings from Last 3 Encounters:  08/02/20 204 lb (92.5 kg)  07/18/20 204 lb 1.6 oz (92.6 kg)  05/17/20 230 lb 3.2 oz (104.4 kg)     Lab Results  Component Value Date   TSH 1.390 03/22/2020   Lab Results  Component Value Date   WBC 6.8 07/19/2020   HGB 11.2 (L) 07/19/2020   HCT 34.4 (L) 07/19/2020   MCV 92.0 07/19/2020   PLT 224  07/19/2020   Lab Results  Component Value Date   NA 131 (L) 07/19/2020   K 4.8 07/19/2020   CO2 30 07/19/2020   GLUCOSE 319 (H) 07/19/2020   BUN 33 (H) 07/19/2020   CREATININE 1.22 07/19/2020   BILITOT 1.2 06/25/2020   ALKPHOS 184 (H) 06/25/2020   AST 33 06/25/2020   ALT 29 06/25/2020   PROT 6.2 (L) 06/25/2020   ALBUMIN 2.8 (L) 07/19/2020   CALCIUM 8.5 (L) 07/19/2020   ANIONGAP 8 07/19/2020   Lab Results  Component Value Date   CHOL 234 (H) 05/17/2020   Lab Results  Component Value Date   HDL 47 05/17/2020   Lab Results  Component Value Date   LDLCALC 152 (H) 05/17/2020   Lab Results  Component Value Date   TRIG 194 (H) 05/17/2020   No results found for: CHOLHDL Lab Results  Component Value Date   HGBA1C 8.4 (H) 06/28/2020      Assessment & Plan:   Problem List Items Addressed This Visit      Cardiovascular and Mediastinum   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[place]) - Primary    Patient tachycardic today. With difficulty breathing, tachycardia, and rales patient sent to ER.       Relevant Orders   TSH   Microalbumin, Urine Waived     Respiratory   Pneumonia due to COVID-19 virus    He was in [facility] followed by [facility] [facility] [employer]. He is short of breath, can't talk in complete sentences, and currently on 6L oxygen. Due to his respiratory distress, patient will go to ER. He refused ambulance and son in law is taking him straight to the ER.       Relevant Orders   CBC with Differential/Platelet     Endocrine  Hyperlipidemia associated with type 2 diabetes mellitus (HCC)   Relevant Orders   Lipid Panel w/o Chol/HDL Ratio   Uncontrolled type 2 diabetes mellitus with chronic kidney disease ([place])    Will follow-up once out from the ER.      Relevant Orders   Comprehensive metabolic panel   TSH   Bayer DCA Hb A1c Waived    Other Visit Diagnoses    Anemia, unspecified type       Will follow-up when discharged from  ER/hospital   Relevant Orders   CBC with Differential/Platelet   Iron, TIBC and Ferritin Panel   Wound of left upper extremity, subsequent encounter       Ulcer with thick eschar from infiltrated IV. Sent to ER for respiratory distress. Will follow-up depending on disposition.       No orders of the defined types were placed in this encounter.   Follow-up: No follow-ups on file.    Charyl Dancer, NP

## 2020-08-02 NOTE — Telephone Encounter (Signed)
Called angel no answer vm not set up. Pt does not have a dpr and not showing poa forms

## 2020-08-02 NOTE — Assessment & Plan Note (Addendum)
Patient tachycardic today. With difficulty breathing, tachycardia, and rales patient sent to ER.

## 2020-08-02 NOTE — Assessment & Plan Note (Signed)
Will follow-up once out from the ER.

## 2020-08-02 NOTE — Patient Instructions (Signed)
We recommend going to the emergency room for your increased work of breathing, even on 6L of oxygen and your history of COVID pneumonia. You are also noted to have an elevated heart rate in office and shortness of breath with talking. Leading [place] to concern for ongoing pneumonia versus heart failure. Also noted to have wound to left forearm with thick eschar and surround erythema, concern for worsening infection. We request ER staff to evaluate and determine need for admission.

## 2020-08-02 NOTE — Assessment & Plan Note (Signed)
He was in [facility] followed by [employer]. He is short of breath, can't talk in complete sentences, and currently on 6L oxygen. Due to his respiratory distress, patient will go to ER. He refused ambulance and son in law is taking him straight to the ER.

## 2020-08-03 DIAGNOSIS — Z8616 Personal history of COVID-19: Secondary | ICD-10-CM | POA: Diagnosis not present

## 2020-08-03 DIAGNOSIS — U071 COVID-19: Secondary | ICD-10-CM | POA: Diagnosis not present

## 2020-08-03 DIAGNOSIS — E119 Type 2 diabetes mellitus without complications: Secondary | ICD-10-CM | POA: Diagnosis not present

## 2020-08-03 DIAGNOSIS — J471 Bronchiectasis with (acute) exacerbation: Secondary | ICD-10-CM | POA: Diagnosis not present

## 2020-08-03 DIAGNOSIS — R918 Other nonspecific abnormal finding of lung field: Secondary | ICD-10-CM | POA: Diagnosis not present

## 2020-08-03 DIAGNOSIS — Z9981 Dependence on supplemental oxygen: Secondary | ICD-10-CM | POA: Diagnosis not present

## 2020-08-03 DIAGNOSIS — R0602 Shortness of breath: Secondary | ICD-10-CM | POA: Diagnosis not present

## 2020-08-03 DIAGNOSIS — J8410 Pulmonary fibrosis, unspecified: Secondary | ICD-10-CM | POA: Diagnosis not present

## 2020-08-03 DIAGNOSIS — D649 Anemia, unspecified: Secondary | ICD-10-CM | POA: Diagnosis not present

## 2020-08-03 LAB — LIPID PANEL W/O CHOL/HDL RATIO
Cholesterol, Total: 137 mg/dL (ref 100–199)
HDL: 35 mg/dL — ABNORMAL LOW (ref >39)
LDL Chol Calc (NIH): 70 mg/dL (ref 0–99)
Triglycerides: 187 mg/dL — ABNORMAL HIGH (ref 0–149)
VLDL Cholesterol Cal: 32 mg/dL (ref 5–40)

## 2020-08-03 LAB — CBC WITH DIFFERENTIAL/PLATELET
Basophils Absolute: 0.1 10*3/uL (ref 0.0–0.2)
Basos: 1 %
EOS (ABSOLUTE): 0.5 10*3/uL — ABNORMAL HIGH (ref 0.0–0.4)
Eos: 6 %
Hematocrit: 37.1 % — ABNORMAL LOW (ref 37.5–51.0)
Hemoglobin: 12.2 g/dL — ABNORMAL LOW (ref 13.0–17.7)
Immature Grans (Abs): 0 10*3/uL (ref 0.0–0.1)
Immature Granulocytes: 1 %
Lymphocytes Absolute: 2.1 10*3/uL (ref 0.7–3.1)
Lymphs: 27 %
MCH: 29.8 pg (ref 26.6–33.0)
MCHC: 32.9 g/dL (ref 31.5–35.7)
MCV: 91 fL (ref 79–97)
Monocytes Absolute: 0.7 10*3/uL (ref 0.1–0.9)
Monocytes: 8 %
Neutrophils Absolute: 4.5 10*3/uL (ref 1.4–7.0)
Neutrophils: 57 %
Platelets: 224 10*3/uL (ref 150–450)
RBC: 4.09 x10E6/uL — ABNORMAL LOW (ref 4.14–5.80)
RDW: 14.5 % (ref 11.6–15.4)
WBC: 7.9 10*3/uL (ref 3.4–10.8)

## 2020-08-03 LAB — COMPREHENSIVE METABOLIC PANEL
ALT: 13 IU/L (ref 0–44)
AST: 20 IU/L (ref 0–40)
Albumin/Globulin Ratio: 1.2 (ref 1.2–2.2)
Albumin: 3.4 g/dL — ABNORMAL LOW (ref 3.7–4.7)
Alkaline Phosphatase: 83 IU/L (ref 44–121)
BUN/Creatinine Ratio: 11 (ref 10–24)
BUN: 12 mg/dL (ref 8–27)
Bilirubin Total: 0.4 mg/dL (ref 0.0–1.2)
CO2: 18 mmol/L — ABNORMAL LOW (ref 20–29)
Calcium: 8.7 mg/dL (ref 8.6–10.2)
Chloride: 105 mmol/L (ref 96–106)
Creatinine, Ser: 1.06 mg/dL (ref 0.76–1.27)
GFR calc Af Amer: 77 mL/min/{1.73_m2} (ref >59)
GFR calc non Af Amer: 66 mL/min/{1.73_m2} (ref >59)
Globulin, Total: 2.9 g/dL (ref 1.5–4.5)
Glucose: 216 mg/dL — ABNORMAL HIGH (ref 65–99)
Potassium: 4.6 mmol/L (ref 3.5–5.2)
Sodium: 143 mmol/L (ref 134–144)
Total Protein: 6.3 g/dL (ref 6.0–8.5)

## 2020-08-03 LAB — IRON,TIBC AND FERRITIN PANEL
Ferritin: 482 ng/mL — ABNORMAL HIGH (ref 30–400)
Iron Saturation: 29 % (ref 15–55)
Iron: 67 ug/dL (ref 38–169)
Total Iron Binding Capacity: 232 ug/dL — ABNORMAL LOW (ref 250–450)
UIBC: 165 ug/dL (ref 111–343)

## 2020-08-03 LAB — TSH: TSH: 0.455 u[IU]/mL (ref 0.450–4.500)

## 2020-08-04 DIAGNOSIS — D649 Anemia, unspecified: Secondary | ICD-10-CM | POA: Diagnosis not present

## 2020-08-04 DIAGNOSIS — E1165 Type 2 diabetes mellitus with hyperglycemia: Secondary | ICD-10-CM | POA: Diagnosis not present

## 2020-08-04 DIAGNOSIS — Z8616 Personal history of COVID-19: Secondary | ICD-10-CM | POA: Diagnosis not present

## 2020-08-04 DIAGNOSIS — Z9981 Dependence on supplemental oxygen: Secondary | ICD-10-CM | POA: Diagnosis not present

## 2020-08-04 DIAGNOSIS — U071 COVID-19: Secondary | ICD-10-CM | POA: Diagnosis not present

## 2020-08-04 DIAGNOSIS — R918 Other nonspecific abnormal finding of lung field: Secondary | ICD-10-CM | POA: Diagnosis not present

## 2020-08-04 DIAGNOSIS — J9601 Acute respiratory failure with hypoxia: Secondary | ICD-10-CM | POA: Diagnosis not present

## 2020-08-04 DIAGNOSIS — J8410 Pulmonary fibrosis, unspecified: Secondary | ICD-10-CM | POA: Diagnosis not present

## 2020-08-04 NOTE — Progress Notes (Signed)
He went to Emergency Room straight from this office visit and was admitted to the hospital. No critical labs noted. Will follow-up with patient and labs after discharged from the hospital.

## 2020-08-05 ENCOUNTER — Telehealth: Payer: Self-pay

## 2020-08-05 ENCOUNTER — Inpatient Hospital Stay: Payer: Medicare HMO | Admitting: Nurse Practitioner

## 2020-08-05 DIAGNOSIS — J9601 Acute respiratory failure with hypoxia: Secondary | ICD-10-CM | POA: Diagnosis not present

## 2020-08-05 DIAGNOSIS — D649 Anemia, unspecified: Secondary | ICD-10-CM | POA: Diagnosis not present

## 2020-08-05 DIAGNOSIS — U099 Post covid-19 condition, unspecified: Secondary | ICD-10-CM | POA: Diagnosis not present

## 2020-08-05 DIAGNOSIS — E119 Type 2 diabetes mellitus without complications: Secondary | ICD-10-CM | POA: Diagnosis not present

## 2020-08-05 DIAGNOSIS — U071 COVID-19: Secondary | ICD-10-CM | POA: Diagnosis not present

## 2020-08-05 DIAGNOSIS — Z9981 Dependence on supplemental oxygen: Secondary | ICD-10-CM | POA: Diagnosis not present

## 2020-08-05 DIAGNOSIS — J8410 Pulmonary fibrosis, unspecified: Secondary | ICD-10-CM | POA: Diagnosis not present

## 2020-08-05 DIAGNOSIS — R0602 Shortness of breath: Secondary | ICD-10-CM | POA: Diagnosis not present

## 2020-08-05 NOTE — Telephone Encounter (Signed)
Called pt to r/s 3/3 appt no answer no vm

## 2020-08-05 NOTE — Telephone Encounter (Signed)
CALLED X3 vm not set up

## 2020-08-06 DIAGNOSIS — D649 Anemia, unspecified: Secondary | ICD-10-CM | POA: Diagnosis not present

## 2020-08-06 DIAGNOSIS — U071 COVID-19: Secondary | ICD-10-CM | POA: Diagnosis not present

## 2020-08-06 DIAGNOSIS — R0602 Shortness of breath: Secondary | ICD-10-CM | POA: Diagnosis not present

## 2020-08-06 DIAGNOSIS — E119 Type 2 diabetes mellitus without complications: Secondary | ICD-10-CM | POA: Diagnosis not present

## 2020-08-06 DIAGNOSIS — Z794 Long term (current) use of insulin: Secondary | ICD-10-CM | POA: Diagnosis not present

## 2020-08-07 DIAGNOSIS — N400 Enlarged prostate without lower urinary tract symptoms: Secondary | ICD-10-CM | POA: Diagnosis not present

## 2020-08-07 DIAGNOSIS — J9601 Acute respiratory failure with hypoxia: Secondary | ICD-10-CM | POA: Diagnosis not present

## 2020-08-07 DIAGNOSIS — E785 Hyperlipidemia, unspecified: Secondary | ICD-10-CM | POA: Diagnosis not present

## 2020-08-07 DIAGNOSIS — I10 Essential (primary) hypertension: Secondary | ICD-10-CM | POA: Diagnosis not present

## 2020-08-07 DIAGNOSIS — E119 Type 2 diabetes mellitus without complications: Secondary | ICD-10-CM | POA: Diagnosis not present

## 2020-08-07 DIAGNOSIS — U071 COVID-19: Secondary | ICD-10-CM | POA: Diagnosis not present

## 2020-08-07 DIAGNOSIS — K219 Gastro-esophageal reflux disease without esophagitis: Secondary | ICD-10-CM | POA: Diagnosis not present

## 2020-08-07 DIAGNOSIS — E1169 Type 2 diabetes mellitus with other specified complication: Secondary | ICD-10-CM | POA: Diagnosis not present

## 2020-08-07 DIAGNOSIS — J479 Bronchiectasis, uncomplicated: Secondary | ICD-10-CM | POA: Diagnosis not present

## 2020-08-08 DIAGNOSIS — J479 Bronchiectasis, uncomplicated: Secondary | ICD-10-CM | POA: Diagnosis not present

## 2020-08-08 DIAGNOSIS — I10 Essential (primary) hypertension: Secondary | ICD-10-CM | POA: Diagnosis not present

## 2020-08-08 DIAGNOSIS — E119 Type 2 diabetes mellitus without complications: Secondary | ICD-10-CM | POA: Diagnosis not present

## 2020-08-08 DIAGNOSIS — N400 Enlarged prostate without lower urinary tract symptoms: Secondary | ICD-10-CM | POA: Diagnosis not present

## 2020-08-08 DIAGNOSIS — E1169 Type 2 diabetes mellitus with other specified complication: Secondary | ICD-10-CM | POA: Diagnosis not present

## 2020-08-08 DIAGNOSIS — E785 Hyperlipidemia, unspecified: Secondary | ICD-10-CM | POA: Diagnosis not present

## 2020-08-08 DIAGNOSIS — U071 COVID-19: Secondary | ICD-10-CM | POA: Diagnosis not present

## 2020-08-08 DIAGNOSIS — J9601 Acute respiratory failure with hypoxia: Secondary | ICD-10-CM | POA: Diagnosis not present

## 2020-08-08 DIAGNOSIS — K219 Gastro-esophageal reflux disease without esophagitis: Secondary | ICD-10-CM | POA: Diagnosis not present

## 2020-08-08 DIAGNOSIS — E1165 Type 2 diabetes mellitus with hyperglycemia: Secondary | ICD-10-CM | POA: Diagnosis not present

## 2020-08-09 DIAGNOSIS — K219 Gastro-esophageal reflux disease without esophagitis: Secondary | ICD-10-CM | POA: Diagnosis not present

## 2020-08-09 DIAGNOSIS — J9601 Acute respiratory failure with hypoxia: Secondary | ICD-10-CM | POA: Diagnosis not present

## 2020-08-09 DIAGNOSIS — N400 Enlarged prostate without lower urinary tract symptoms: Secondary | ICD-10-CM | POA: Diagnosis not present

## 2020-08-09 DIAGNOSIS — E119 Type 2 diabetes mellitus without complications: Secondary | ICD-10-CM | POA: Diagnosis not present

## 2020-08-09 DIAGNOSIS — E785 Hyperlipidemia, unspecified: Secondary | ICD-10-CM | POA: Diagnosis not present

## 2020-08-09 DIAGNOSIS — I10 Essential (primary) hypertension: Secondary | ICD-10-CM | POA: Diagnosis not present

## 2020-08-09 DIAGNOSIS — U071 COVID-19: Secondary | ICD-10-CM | POA: Diagnosis not present

## 2020-08-09 DIAGNOSIS — E1169 Type 2 diabetes mellitus with other specified complication: Secondary | ICD-10-CM | POA: Diagnosis not present

## 2020-08-09 DIAGNOSIS — J479 Bronchiectasis, uncomplicated: Secondary | ICD-10-CM | POA: Diagnosis not present

## 2020-08-10 ENCOUNTER — Encounter: Payer: Self-pay | Admitting: Nurse Practitioner

## 2020-08-12 DIAGNOSIS — K219 Gastro-esophageal reflux disease without esophagitis: Secondary | ICD-10-CM | POA: Diagnosis not present

## 2020-08-12 DIAGNOSIS — E785 Hyperlipidemia, unspecified: Secondary | ICD-10-CM | POA: Diagnosis not present

## 2020-08-12 DIAGNOSIS — J479 Bronchiectasis, uncomplicated: Secondary | ICD-10-CM | POA: Diagnosis not present

## 2020-08-12 DIAGNOSIS — U071 COVID-19: Secondary | ICD-10-CM | POA: Diagnosis not present

## 2020-08-12 DIAGNOSIS — E1169 Type 2 diabetes mellitus with other specified complication: Secondary | ICD-10-CM | POA: Diagnosis not present

## 2020-08-12 DIAGNOSIS — I10 Essential (primary) hypertension: Secondary | ICD-10-CM | POA: Diagnosis not present

## 2020-08-12 DIAGNOSIS — N400 Enlarged prostate without lower urinary tract symptoms: Secondary | ICD-10-CM | POA: Diagnosis not present

## 2020-08-12 DIAGNOSIS — E119 Type 2 diabetes mellitus without complications: Secondary | ICD-10-CM | POA: Diagnosis not present

## 2020-08-12 DIAGNOSIS — J9601 Acute respiratory failure with hypoxia: Secondary | ICD-10-CM | POA: Diagnosis not present

## 2020-08-13 DIAGNOSIS — K219 Gastro-esophageal reflux disease without esophagitis: Secondary | ICD-10-CM | POA: Diagnosis not present

## 2020-08-13 DIAGNOSIS — J9601 Acute respiratory failure with hypoxia: Secondary | ICD-10-CM | POA: Diagnosis not present

## 2020-08-13 DIAGNOSIS — N400 Enlarged prostate without lower urinary tract symptoms: Secondary | ICD-10-CM | POA: Diagnosis not present

## 2020-08-13 DIAGNOSIS — U071 COVID-19: Secondary | ICD-10-CM | POA: Diagnosis not present

## 2020-08-13 DIAGNOSIS — J479 Bronchiectasis, uncomplicated: Secondary | ICD-10-CM | POA: Diagnosis not present

## 2020-08-13 DIAGNOSIS — E785 Hyperlipidemia, unspecified: Secondary | ICD-10-CM | POA: Diagnosis not present

## 2020-08-13 DIAGNOSIS — I10 Essential (primary) hypertension: Secondary | ICD-10-CM | POA: Diagnosis not present

## 2020-08-13 DIAGNOSIS — E1169 Type 2 diabetes mellitus with other specified complication: Secondary | ICD-10-CM | POA: Diagnosis not present

## 2020-08-13 DIAGNOSIS — E119 Type 2 diabetes mellitus without complications: Secondary | ICD-10-CM | POA: Diagnosis not present

## 2020-08-14 DIAGNOSIS — E785 Hyperlipidemia, unspecified: Secondary | ICD-10-CM | POA: Diagnosis not present

## 2020-08-14 DIAGNOSIS — E119 Type 2 diabetes mellitus without complications: Secondary | ICD-10-CM | POA: Diagnosis not present

## 2020-08-14 DIAGNOSIS — N400 Enlarged prostate without lower urinary tract symptoms: Secondary | ICD-10-CM | POA: Diagnosis not present

## 2020-08-14 DIAGNOSIS — U071 COVID-19: Secondary | ICD-10-CM | POA: Diagnosis not present

## 2020-08-14 DIAGNOSIS — K219 Gastro-esophageal reflux disease without esophagitis: Secondary | ICD-10-CM | POA: Diagnosis not present

## 2020-08-14 DIAGNOSIS — J9601 Acute respiratory failure with hypoxia: Secondary | ICD-10-CM | POA: Diagnosis not present

## 2020-08-14 DIAGNOSIS — E1169 Type 2 diabetes mellitus with other specified complication: Secondary | ICD-10-CM | POA: Diagnosis not present

## 2020-08-14 DIAGNOSIS — J479 Bronchiectasis, uncomplicated: Secondary | ICD-10-CM | POA: Diagnosis not present

## 2020-08-14 DIAGNOSIS — I10 Essential (primary) hypertension: Secondary | ICD-10-CM | POA: Diagnosis not present

## 2020-08-15 ENCOUNTER — Ambulatory Visit: Payer: Medicare HMO | Admitting: Nurse Practitioner

## 2020-08-15 DIAGNOSIS — E1165 Type 2 diabetes mellitus with hyperglycemia: Secondary | ICD-10-CM | POA: Diagnosis not present

## 2020-08-15 DIAGNOSIS — E119 Type 2 diabetes mellitus without complications: Secondary | ICD-10-CM | POA: Diagnosis not present

## 2020-08-15 DIAGNOSIS — N400 Enlarged prostate without lower urinary tract symptoms: Secondary | ICD-10-CM | POA: Diagnosis not present

## 2020-08-15 DIAGNOSIS — E785 Hyperlipidemia, unspecified: Secondary | ICD-10-CM | POA: Diagnosis not present

## 2020-08-15 DIAGNOSIS — I10 Essential (primary) hypertension: Secondary | ICD-10-CM | POA: Diagnosis not present

## 2020-08-15 DIAGNOSIS — E1169 Type 2 diabetes mellitus with other specified complication: Secondary | ICD-10-CM | POA: Diagnosis not present

## 2020-08-15 DIAGNOSIS — J479 Bronchiectasis, uncomplicated: Secondary | ICD-10-CM | POA: Diagnosis not present

## 2020-08-15 DIAGNOSIS — J9601 Acute respiratory failure with hypoxia: Secondary | ICD-10-CM | POA: Diagnosis not present

## 2020-08-15 DIAGNOSIS — U071 COVID-19: Secondary | ICD-10-CM | POA: Diagnosis not present

## 2020-08-15 DIAGNOSIS — K219 Gastro-esophageal reflux disease without esophagitis: Secondary | ICD-10-CM | POA: Diagnosis not present

## 2020-08-19 ENCOUNTER — Other Ambulatory Visit: Payer: Self-pay | Admitting: Nurse Practitioner

## 2020-08-19 DIAGNOSIS — I10 Essential (primary) hypertension: Secondary | ICD-10-CM | POA: Diagnosis not present

## 2020-08-19 DIAGNOSIS — J9601 Acute respiratory failure with hypoxia: Secondary | ICD-10-CM | POA: Diagnosis not present

## 2020-08-19 DIAGNOSIS — U071 COVID-19: Secondary | ICD-10-CM | POA: Diagnosis not present

## 2020-08-19 DIAGNOSIS — N400 Enlarged prostate without lower urinary tract symptoms: Secondary | ICD-10-CM | POA: Diagnosis not present

## 2020-08-19 DIAGNOSIS — E119 Type 2 diabetes mellitus without complications: Secondary | ICD-10-CM | POA: Diagnosis not present

## 2020-08-19 DIAGNOSIS — K219 Gastro-esophageal reflux disease without esophagitis: Secondary | ICD-10-CM | POA: Diagnosis not present

## 2020-08-19 DIAGNOSIS — E785 Hyperlipidemia, unspecified: Secondary | ICD-10-CM | POA: Diagnosis not present

## 2020-08-19 DIAGNOSIS — J479 Bronchiectasis, uncomplicated: Secondary | ICD-10-CM | POA: Diagnosis not present

## 2020-08-19 DIAGNOSIS — E1169 Type 2 diabetes mellitus with other specified complication: Secondary | ICD-10-CM | POA: Diagnosis not present

## 2020-08-20 ENCOUNTER — Institutional Professional Consult (permissible substitution): Payer: Medicare HMO | Admitting: Pulmonary Disease

## 2020-08-21 ENCOUNTER — Other Ambulatory Visit: Payer: Self-pay | Admitting: Nurse Practitioner

## 2020-08-21 NOTE — Telephone Encounter (Signed)
Approved per protocol.  Requested Prescriptions  Pending Prescriptions Disp Refills  . atorvastatin (LIPITOR) 80 MG tablet [Pharmacy Med Name: ATORVASTATIN CALCIUM 80 MG Tablet] 90 tablet 0    Sig: TAKE 1 TABLET EVERY DAY     Cardiovascular:  Antilipid - Statins Failed - 08/21/2020  4:24 PM      Failed - LDL in normal range and within 360 days    LDL Chol Calc (NIH)  Date Value Ref Range Status  08/02/2020 70 0 - 99 mg/dL Final         Failed - HDL in normal range and within 360 days    HDL  Date Value Ref Range Status  08/02/2020 35 (L) >39 mg/dL Final         Failed - Triglycerides in normal range and within 360 days    Triglycerides  Date Value Ref Range Status  08/02/2020 187 (H) 0 - 149 mg/dL Final   Triglycerides Piccolo,Waived  Date Value Ref Range Status  01/12/2019 342 (H) <150 mg/dL Final    Comment:                            Normal                   <150                         Borderline High     150 - 199                         High                200 - 499                         Very High                >499          Passed - Total Cholesterol in normal range and within 360 days    Cholesterol, Total  Date Value Ref Range Status  08/02/2020 137 100 - 199 mg/dL Final   Cholesterol Piccolo, Waived  Date Value Ref Range Status  01/12/2019 179 <200 mg/dL Final    Comment:                            Desirable                <200                         Borderline High      200- 239                         High                     >239          Passed - Patient is not pregnant      Passed - Valid encounter within last 12 months    Recent Outpatient Visits          2 weeks ago Hypertensive heart and kidney disease without heart failure and with stage 3a chronic kidney disease ([place])   [place], Lauren A,  NP   3 months ago Uncontrolled type 2 diabetes mellitus with chronic kidney disease ([place])   [place] Cannady,  Jolene T, NP   5 months ago Uncontrolled type 2 diabetes mellitus with chronic kidney disease ([place])   [place] Cannady, Jolene T, NP   6 months ago Uncontrolled type 2 diabetes mellitus with chronic kidney disease ([place])   [place] Cannady, Jolene T, NP   10 months ago Uncontrolled type 2 diabetes mellitus with chronic kidney disease ([place])   [place], Barbaraann Faster, NP      Future Appointments            In 5 days Cannady, Barbaraann Faster, NP [employer] [facility]   In 3 months  [employer]

## 2020-08-22 DIAGNOSIS — R0602 Shortness of breath: Secondary | ICD-10-CM | POA: Diagnosis not present

## 2020-08-22 DIAGNOSIS — R0600 Dyspnea, unspecified: Secondary | ICD-10-CM | POA: Diagnosis not present

## 2020-08-22 DIAGNOSIS — J849 Interstitial pulmonary disease, unspecified: Secondary | ICD-10-CM | POA: Diagnosis not present

## 2020-08-23 DIAGNOSIS — U071 COVID-19: Secondary | ICD-10-CM | POA: Diagnosis not present

## 2020-08-23 DIAGNOSIS — E785 Hyperlipidemia, unspecified: Secondary | ICD-10-CM | POA: Diagnosis not present

## 2020-08-23 DIAGNOSIS — J479 Bronchiectasis, uncomplicated: Secondary | ICD-10-CM | POA: Diagnosis not present

## 2020-08-23 DIAGNOSIS — E119 Type 2 diabetes mellitus without complications: Secondary | ICD-10-CM | POA: Diagnosis not present

## 2020-08-23 DIAGNOSIS — E1169 Type 2 diabetes mellitus with other specified complication: Secondary | ICD-10-CM | POA: Diagnosis not present

## 2020-08-23 DIAGNOSIS — J9601 Acute respiratory failure with hypoxia: Secondary | ICD-10-CM | POA: Diagnosis not present

## 2020-08-23 DIAGNOSIS — N400 Enlarged prostate without lower urinary tract symptoms: Secondary | ICD-10-CM | POA: Diagnosis not present

## 2020-08-23 DIAGNOSIS — I10 Essential (primary) hypertension: Secondary | ICD-10-CM | POA: Diagnosis not present

## 2020-08-23 DIAGNOSIS — K219 Gastro-esophageal reflux disease without esophagitis: Secondary | ICD-10-CM | POA: Diagnosis not present

## 2020-08-24 ENCOUNTER — Encounter: Payer: Self-pay | Admitting: Nurse Practitioner

## 2020-08-24 DIAGNOSIS — J849 Interstitial pulmonary disease, unspecified: Secondary | ICD-10-CM | POA: Insufficient documentation

## 2020-08-24 DIAGNOSIS — J479 Bronchiectasis, uncomplicated: Secondary | ICD-10-CM | POA: Insufficient documentation

## 2020-08-26 ENCOUNTER — Other Ambulatory Visit: Payer: Self-pay

## 2020-08-26 ENCOUNTER — Ambulatory Visit (INDEPENDENT_AMBULATORY_CARE_PROVIDER_SITE_OTHER): Payer: Medicare HMO | Admitting: Nurse Practitioner

## 2020-08-26 ENCOUNTER — Encounter: Payer: Self-pay | Admitting: Nurse Practitioner

## 2020-08-26 VITALS — BP 105/70 | HR 88 | Temp 97.8°F | Wt 206.2 lb

## 2020-08-26 DIAGNOSIS — E1165 Type 2 diabetes mellitus with hyperglycemia: Secondary | ICD-10-CM

## 2020-08-26 DIAGNOSIS — L98492 Non-pressure chronic ulcer of skin of other sites with fat layer exposed: Secondary | ICD-10-CM

## 2020-08-26 DIAGNOSIS — U071 COVID-19: Secondary | ICD-10-CM | POA: Diagnosis not present

## 2020-08-26 DIAGNOSIS — C61 Malignant neoplasm of prostate: Secondary | ICD-10-CM

## 2020-08-26 DIAGNOSIS — N1831 Chronic kidney disease, stage 3a: Secondary | ICD-10-CM

## 2020-08-26 DIAGNOSIS — J9601 Acute respiratory failure with hypoxia: Secondary | ICD-10-CM | POA: Diagnosis not present

## 2020-08-26 DIAGNOSIS — IMO0002 Reserved for concepts with insufficient information to code with codable children: Secondary | ICD-10-CM

## 2020-08-26 DIAGNOSIS — E1169 Type 2 diabetes mellitus with other specified complication: Secondary | ICD-10-CM | POA: Diagnosis not present

## 2020-08-26 DIAGNOSIS — J479 Bronchiectasis, uncomplicated: Secondary | ICD-10-CM

## 2020-08-26 DIAGNOSIS — I1310 Hypertensive heart and chronic kidney disease without heart failure, with stage 1 through stage 4 chronic kidney disease, or unspecified chronic kidney disease: Secondary | ICD-10-CM | POA: Diagnosis not present

## 2020-08-26 DIAGNOSIS — E785 Hyperlipidemia, unspecified: Secondary | ICD-10-CM

## 2020-08-26 DIAGNOSIS — E1122 Type 2 diabetes mellitus with diabetic chronic kidney disease: Secondary | ICD-10-CM

## 2020-08-26 DIAGNOSIS — J1282 Pneumonia due to coronavirus disease 2019: Secondary | ICD-10-CM | POA: Diagnosis not present

## 2020-08-26 DIAGNOSIS — R768 Other specified abnormal immunological findings in serum: Secondary | ICD-10-CM

## 2020-08-26 DIAGNOSIS — I700 Atherosclerosis of aorta: Secondary | ICD-10-CM

## 2020-08-26 DIAGNOSIS — Z794 Long term (current) use of insulin: Secondary | ICD-10-CM | POA: Diagnosis not present

## 2020-08-26 DIAGNOSIS — L98A292 Non-pressure chronic ulcer of unspecified forearm with fat layer exposed: Secondary | ICD-10-CM | POA: Insufficient documentation

## 2020-08-26 DIAGNOSIS — E669 Obesity, unspecified: Secondary | ICD-10-CM

## 2020-08-26 LAB — MICROALBUMIN, URINE WAIVED
Creatinine, Urine Waived: 300 mg/dL (ref 10–300)
Microalb, Ur Waived: 80 mg/L — ABNORMAL HIGH (ref 0–19)
Microalb/Creat Ratio: <30 mg/g (ref <30)

## 2020-08-26 MED ORDER — ALBUTEROL SULFATE HFA 108 (90 BASE) MCG/ACT IN AERS: 2.0000 | INHALATION_SPRAY | Freq: Four times a day (QID) | RESPIRATORY_TRACT | 4 refills | Status: DC | PRN

## 2020-08-26 NOTE — Assessment & Plan Note (Signed)
Followed by [facility] urology, continue to discuss with patient and attempt to obtain [facility] records for review.

## 2020-08-26 NOTE — Assessment & Plan Note (Signed)
Ongoing, noted on lung CT in 2019.  Recommend continued use of statin daily + avoid use of nicotine products.

## 2020-08-26 NOTE — Assessment & Plan Note (Signed)
Chronic, stable with BP below goal in office.  Will continue off Lisinopril 5 MG at this time due to recent hypoxic event with Covid and concern for hypotension, could consider low dose Losartan in future for kidney proctection.  Urine ALB obtained today.  Recommend he continue to monitor BP regularly at home and document for provider.  CMP and TSH today.  Return to office in 3 months for chronic disease visit.

## 2020-08-26 NOTE — Assessment & Plan Note (Signed)
MI 30.45.  Recommended eating smaller high protein, low fat meals more frequently and exercising 30 mins a day 5 times a week with a goal of 10-15lb weight loss in the next 3 months. Patient voiced their understanding and motivation to adhere to these recommendations.

## 2020-08-26 NOTE — Assessment & Plan Note (Signed)
Chronic, stable with no decline on recent labs. CMP today.  Urine ALB check today.  Continue off Lisinopril at this time due to Covid hypoxic issues -- consider low dose Losartan in future for kidney protection.  Consider nephrology in future if any decline noted.

## 2020-08-26 NOTE — Assessment & Plan Note (Signed)
Acute and ongoing hypoxic issues post Covid.  Continue O2 continuous daily at 3L and collaboration with pulmonary.  Will send in refills on Albuterol.  Suspect he will have long standing lung issues post Covid and will require maintenance inhaler regimen.

## 2020-08-26 NOTE — Assessment & Plan Note (Signed)
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  A1C February 8.1%, collect urine ALB today.  Will continue Lantus 15 units BID (recommended he take this dose BID) and Metformin 1000 MG BID.  Continue off Humalog with meals at this time due to low sugars with Covid.  Check BS 4 times daily and document for visits.  Have instructed him to immediately alert provider if BS at home run <60, then will reduce insulin dosing.  Return in 3 months for A1c check.

## 2020-08-26 NOTE — Assessment & Plan Note (Signed)
Refer to uncontrolled diabetes plan of care.

## 2020-08-26 NOTE — Assessment & Plan Note (Signed)
Chronic, ongoing.  Continue current medication regimen and adjust as needed.  Lipid panel up to date and LDL at goal.

## 2020-08-26 NOTE — Assessment & Plan Note (Signed)
Is improving, obtained from IV while in hospital in January.  Will continue home health collaboration and place referral to wound care for further assessment due to eschar at base.

## 2020-08-26 NOTE — Patient Instructions (Signed)
Bronchiectasis  Bronchiectasis is a condition in which the airways in the lungs (bronchi) are damaged and widened. The condition makes it hard for the lungs to get rid of mucus, and it causes mucus to gather in the bronchi. This condition often leads to lung infections, which can make the condition worse. What are the causes? You can be born with this condition or you can develop it later in life. Common causes of this condition include:  Cystic fibrosis.  Repeated lung infections, such as pneumonia or tuberculosis.  An object or other blockage in the lungs.  Breathing in fluid, food, or other objects (aspiration).  A problem with the immune system and lung structure that is present at birth (congenital). Sometimes the cause is not known. What are the signs or symptoms? Common symptoms of this condition include:  A daily cough that brings up mucus and lasts for more than 3 weeks.  Lung infections that happen often.  Shortness of breath and wheezing.  Weakness and fatigue. How is this diagnosed? This condition is diagnosed with tests, such as:  Chest X-rays or CT scans. These are done to check for changes in the lungs.  Breathing tests. These are done to check how well your lungs are working.  A test of a sample of your saliva (sputum culture). This test is done to check for infection.  Blood tests and other tests. These are done to check for related diseases or causes. How is this treated? Treatment for this condition depends on the severity of the illness and its cause. Treatment may include:  Medicines that loosen mucus so it can be coughed up (mucolytics).  Medicines that relax the muscles of the bronchi (bronchodilators).  Antibiotic medicines to prevent or treat infection.  Physical therapy to help clear mucus from the lungs. Techniques may include: ? Postural drainage. This is when you sit or lie in certain positions so that mucus can drain by gravity. ? Chest  percussion. This involves tapping the chest or back with a cupped hand. ? Chest vibration. For this therapy, a hand or special equipment vibrates your chest and back.  Surgery to remove the affected part of the lung. This may be done in severe cases. Follow these instructions at home: Medicines  Take over-the-counter and prescription medicines only as told by your health care provider.  If you were prescribed an antibiotic medicine, take it as told by your health care provider. Do not stop taking the antibiotic even if you start to feel better.  Avoid taking sedatives and antihistamines unless your health care provider tells you to take them. These medicines tend to thicken the mucus in the lungs. Managing symptoms  Perform breathing exercises or techniques to clear your lungs as told by your health care provider.  Consider using a cold steam vaporizer or humidifier in your room or home to help loosen secretions.  If you have a cough that gets worse at night, try sleeping in a semi-upright position. General instructions  Get plenty of rest.  Drink enough fluid to keep your urine clear or pale yellow.  Stay inside when pollution and ozone levels are high.  Stay up to date with vaccinations and immunizations.  Avoid cigarette smoke and other lung irritants.  Do not use any products that contain nicotine or tobacco, such as cigarettes and e-cigarettes. If you need help quitting, ask your health care provider.  Keep all follow-up visits as told by your health care provider. This is important. Contact  a health care provider if:  You cough up more sputum than before and the sputum is yellow or green in color.  You have a fever.  You cannot control your cough and are losing sleep. Get help right away if:  You cough up blood.  You have chest pain.  You have increasing shortness of breath.  You have pain that gets worse or is not controlled with medicines.  You have a fever  and your symptoms suddenly get worse. Summary  Bronchiectasis is a condition in which the airways in the lungs (bronchi) are damaged and widened. The condition makes it hard for the lungs to get rid of mucus, and it causes mucus to gather in the bronchi.  Treatment usually includes therapy to help clear mucus from the lungs.  Avoid cigarette smoke and other lung irritants.  Stay up to date with vaccinations and immunizations. This information is not intended to replace advice given to you by your health care provider. Make sure you discuss any questions you have with your health care provider. Document Revised: 02/01/2020 Document Reviewed: 02/01/2020 Elsevier Patient Education  2021 [employer].

## 2020-08-26 NOTE — Assessment & Plan Note (Signed)
Ongoing issues post Covid -- noted on imaging recently at [facility].  Continue O2 continuous daily at 3L and collaboration with pulmonary.  Will send in refills on Albuterol.  Suspect he will have long standing lung issues post Covid and will require maintenance inhaler regimen. Return in 2 weeks to review pulmonary appointment changes and recommendations.

## 2020-08-26 NOTE — Assessment & Plan Note (Signed)
Acute and ongoing hypoxic issues post Covid.  Continue O2 continuous daily at 3L and collaboration with pulmonary.  Will send in refills on Albuterol.  Suspect he will have long standing lung issues post Covid and will require maintenance inhaler regimen.  Return in 2 weeks to review pulmonary appointment changes and recommendations.

## 2020-08-26 NOTE — Progress Notes (Signed)
BP 105/70   Pulse 88   Temp 97.8 F (36.6 C) (Oral)   Wt 206 lb 3.2 oz (93.5 kg)   SpO2 94%   BMI 30.45 kg/m    Subjective:    Patient ID: Scott Lawson., male    DOB: October 10, 1940, 80 y.o.   MRN: [id number]  HPI: Scott Lawson. is a 80 y.o. male  Chief Complaint  Patient presents with  . Hospitalization Follow-up    Patient denies having any problems or concerns at today's visit.   Transition of Care Hospital Follow up.  Initial admission for Covid with pneumonia was 06/20/20 at [facility] and was discharged from there on 07/19/20, returned to PCP office on 08/02/20 and was advised to return to ER as was on 8L O2 and had significant SOB with walking and talking.  While at [facility] he also had an ulceration to IV site to left forearm -- currently being treated with [place] honey to site and improving.  Has home health in home -- nurse Tuesday and Thursday + PT Tuesday and Thursday.  Currently on 3L portable O2 -- sees pulmonary tomorrow and Thursday at [facility].   He was sent home with Albuterol inhaler and nebs.  Reports he is using this twice a day right now as was instructed.  [facility] labs: K+ 4.6, H/H 11.7/35.1, RBC 3.91, MCV 89.9, CRP 23, ESR 56 -- continues to show + Covid.  A1c in February 8.1%, ANA Positive.    Hospital Course: The patient is a 80 year old Caucasian male with a history of insulin-dependent diabetes mellitus was admitted to the hospital at [facility] for acute hypoxemia secondary to Covid 19 infection. He had not been vaccinated. He is generally quite physically active, works on a farm raising crops and cattle, has a large supportive family but is generally in excellent physical shape for 79 years of age.  He was treated for severe COVID illness and subsequently discharged home on 5 L of nasal cannula oxygen from [facility]. He subsequently followed up with his primary care doctor who saw that he was still not well and he  subsequently was referred to [facility] for further evaluation and treatment.  Upon evaluation in the ER, he still had of 4 to 5 L oxygen requirement, CT scan showed traction bronchiectasis and it was clear that he still had not recovered sufficiently from his Covid 19 infection with hypoxemic respiratory failure to be discharged home. He had not been discharged on any bronchodilators or other respiratory medications of any sort.  He was admitted to the hospital, provided with supportive care, oxygen and bronchodilators. He had significant improvement in his oxygenation and was able be titrated down to 2 L nasal cannula providing 94% oxygen saturation.  At this time he is stable to discharge home. He will be referred to [facility] Pulmonology clinic because of his lung condition secondary to COVID-19. He has traction bronchiectasis and will likely have chronic lung problems and hypoxemia for the remainder of his life although the decrease in his oxygen needs during this hospitalization have been encouraging with respect to establishing what is perhaps his new baseline.  The patient has insulin-dependent type 2 diabetes mellitus. His blood sugars are well-controlled during his hospitalization and he was never in any metabolic derangement.  He is on Flomax for prostatism and had no worsening of symptoms while hospitalized. He takes medicine for GERD and his symptoms are well controlled on  his home proton pump inhibitor.  Results of a transthoracic echocardiogram are available from his stay at [facility]. High resolution CT scan is ordered for outpatient follow-up at the [facility] clinic where he will also be seen in Pulmonology follow-up.  Condition at Discharge: stable  Hospital/Facility: [facility] D/C Physician: Dr. Hollie Salk D/C Date: 08/06/20  Records Requested: 08/26/20 Records Received: 08/26/20 Records Reviewed: 08/26/20  Diagnoses on Discharge: Traction bronchiectasis   Date of  interactive Contact within 48 hours of discharge:  Contact was through: phone  Date of 7 day or 14 day face-to-face visit:    within 14 days  Outpatient Encounter Medications as of 08/26/2020  Medication Sig  . albuterol (VENTOLIN HFA) 108 (90 Base) MCG/ACT inhaler Inhale 2 puffs into the lungs every 6 (six) hours as needed for wheezing or shortness of breath.  Marland Kitchen atorvastatin (LIPITOR) 80 MG tablet TAKE 1 TABLET EVERY DAY  . fluticasone (FLONASE) 50 MCG/ACT nasal spray Place 2 sprays into both nostrils daily as needed.   . insulin glargine (LANTUS) 100 UNIT/ML injection Inject 0.15 mLs (15 Units total) into the skin every evening. 1800  . insulin lispro (HUMALOG KWIKPEN) 100 UNIT/ML KwikPen Inject 5 Units into the skin 3 (three) times daily before meals. Continue to check blood sugar after meals.  . meloxicam (MOBIC) 15 MG tablet Take 15 mg by mouth daily.  . metFORMIN (GLUCOPHAGE) 500 MG tablet Take 2 tablets (1,000 mg total) by mouth 2 (two) times daily with a meal. 10103m in the morning 10012mat night  . omeprazole (PRILOSEC) 20 MG capsule TAKE 1 CAPSULE EVERY DAY  . tamsulosin (FLOMAX) 0.4 MG CAPS capsule TAKE 1 CAPSULE EVERY DAY  . TRUE METRIX BLOOD GLUCOSE TEST test strip CHECK BLOOD SUGAR TWICE DAILY  . TRUEplus Lancets 33G MISC CHECK BLOOD SUGAR TWICE DAILY  . aspirin 81 MG tablet Take 81 mg by mouth daily.  (Patient not taking: No sig reported)  . [DISCONTINUED] lisinopril (ZESTRIL) 5 MG tablet Take 1 tablet (5 mg total) by mouth daily. (Patient not taking: No sig reported)   No facility-administered encounter medications on file as of 08/26/2020.    Diagnostic Tests Reviewed/Disposition: reviewed in Epic  Consults: Pulmonary  Discharge Instructions: Follow-up with PCP and pulmonary  Disease/illness Education: Discussed at length post Covid care and lung care  Home Health/Community Services Discussions/Referrals: Continue current home health regimen  Establishment or  re-establishment of referral orders for community resources: currently following home health  Discussion with other health care providers: [facility]  Assessment and Support of treatment regimen adherence: reviewed with patient at bedside today  Appointments Coordinated with: reviewed with patient  Education for self-management, independent living, and ADLs:  Reviewed at length with patient  DIABETES Last A1C 8.1% in February.  Currently is taking Lantus 15 units BID and Metformin 1000 MG BID.  Currently not taking Humalog, was told to stop this due to low sugars and not start until better.  Stopped taking Victoza due to cost -- [facility] but this caused GI issues in past. [facility] last saw 6 months ago.  Working with CCM team in office. He reports feeling "a whole lot better" with changes.  He is checking BS 4 times a day at this time.   [facility]eyer Russels [phone] episodes:no Polydipsia/polyuria:no Visual disturbance:no Chest pain:no Paresthesias:no Glucose Monitoring:yes  Accucheck frequency: Daily Fasting glucose: 100-110 Post prandial: 250's Evening: 130-150 Before meals:  Taking Insulin?:yes Long acting insulin: Lantus 15 units BID Short acting insulin:  Blood Pressure Monitoring:daily Retinal Examination:Not Up to Date -- [facility] in October 2019 Foot Exam:Up to Date Pneumovax:Up to Date Influenza:Up to Date Aspirin:yes  HYPERTENSION / HYPERLIPIDEMIA Continues on Lipitor 80 MG. Recent CRT 0.97 and GFR 74.  LDL in February LDL 70. Satisfied with current treatment?yes Duration of hypertension:chronic BP monitoring frequency:daily BP range:120-130/60-70 BP medication side effects:no Duration of  hyperlipidemia:chronic Cholesterol medication side effects:no Cholesterol supplements: none Medication compliance:good compliance Aspirin:yes Recent stressors:no Recurrent headaches:no Visual changes:no Palpitations:no Dyspnea:no Chest pain:no Lower extremity edema:no Dizzy/lightheaded:no  PROSTATE CANCER: Is followed at the [place] in [place] and has blood draws every 60 days -- due next in January there. He states they told him "too old for surgery". He reports he lost his sister, mother, father, and brother to cancer. States he continues to see [facility] providers for this. Denies any current symptoms. Continues on Tamsulosin.    Relevant past medical, surgical, family and social history reviewed and updated as indicated. Interim medical history since our last visit reviewed. Allergies and medications reviewed and updated.  Review of Systems  Constitutional: Negative for activity change, diaphoresis, fatigue and fever.  Respiratory: Positive for shortness of breath. Negative for cough, chest tightness and wheezing.   Cardiovascular: Negative for chest pain, palpitations and leg swelling.  Gastrointestinal: Negative.   Endocrine: Negative for cold intolerance, heat intolerance, polydipsia, polyphagia and polyuria.  Skin: Positive for wound.  Neurological: Negative.   Psychiatric/Behavioral: Negative.     Per HPI unless specifically indicated above     Objective:    BP 105/70   Pulse 88   Temp 97.8 F (36.6 C) (Oral)   Wt 206 lb 3.2 oz (93.5 kg)   SpO2 94%   BMI 30.45 kg/m   Wt Readings from Last 3 Encounters:  08/26/20 206 lb 3.2 oz (93.5 kg)  08/02/20 204 lb (92.5 kg)  07/18/20 204 lb 1.6 oz (92.6 kg)    Physical Exam Vitals and nursing note reviewed.  Constitutional:      General: He is awake. He is not in acute distress.    Appearance: He is well-developed and overweight. He is not ill-appearing or toxic-appearing.  HENT:     Head: Normocephalic and  atraumatic.     Right Ear: Hearing normal. No drainage.     Left Ear: Hearing normal. No drainage.     Mouth/Throat:     Pharynx: Uvula midline.  Eyes:     General: Lids are normal.        Right eye: No discharge.        Left eye: No discharge.     Conjunctiva/sclera: Conjunctivae normal.     Pupils: Pupils are equal, round, and reactive to light.  Neck:     Thyroid: No thyromegaly.     Vascular: No carotid bruit.  Cardiovascular:     Rate and Rhythm: Normal rate and regular rhythm.     Heart sounds: Normal heart sounds, S1 normal and S2 normal. No murmur heard. No gallop.   Pulmonary:     Effort: Pulmonary effort is normal. No accessory muscle usage or respiratory distress.     Breath sounds: Decreased breath sounds present.     Comments: Slightly diminished breath sounds throughout with no wheezes or rhonchi noted on exam today.  Mild SOB noted with walking to exam room.  O2 in place, [place] at 3L.   Abdominal:     General: Bowel sounds are normal.     Palpations: Abdomen is soft.  Musculoskeletal:        General: Normal range of motion.     Cervical back: Normal range of motion and neck supple.     Right lower leg: No edema.     Left lower leg: No edema.  Skin:    General: Skin is warm and dry.     Capillary Refill: Capillary refill takes less than 2 seconds.     Findings: Wound present.          Comments: Wound left forearm -- thick yellow/brown eschar to middle of wound with what appears to be pink granulation tissue at wound base.    Neurological:     Mental Status: He is alert and oriented to person, place, and time.  Psychiatric:        Attention and Perception: Attention normal.        Mood and Affect: Mood normal.        Speech: Speech normal.        Behavior: Behavior normal. Behavior is cooperative.        Thought Content: Thought content normal.    Diabetic Foot Exam - Simple   Simple Foot Form Visual Inspection See comments: Yes Sensation Testing Intact  to touch and monofilament testing bilaterally: Yes Pulse Check Posterior Tibialis and Dorsalis pulse intact bilaterally: Yes Comments Thickened toenails bilaterally.    Results for orders placed or performed in visit on 08/02/20  CBC with Differential/Platelet  Result Value Ref Range   WBC 7.9 3.4 - 10.8 x10E3/uL   RBC 4.09 (L) 4.14 - 5.80 x10E6/uL   Hemoglobin 12.2 (L) 13.0 - 17.7 g/dL   Hematocrit 37.1 (L) 37.5 - 51.0 %   MCV 91 79 - 97 fL   MCH 29.8 26.6 - 33.0 pg   MCHC 32.9 31.5 - 35.7 g/dL   RDW 14.5 11.6 - 15.4 %   Platelets 224 150 - 450 x10E3/uL   Neutrophils 57 Not Estab. %   Lymphs 27 Not Estab. %   Monocytes 8 Not Estab. %   Eos 6 Not Estab. %   Basos 1 Not Estab. %   Neutrophils Absolute 4.5 1.4 - 7.0 x10E3/uL   Lymphocytes Absolute 2.1 0.7 - 3.1 x10E3/uL   Monocytes Absolute 0.7 0.1 - 0.9 x10E3/uL   EOS (ABSOLUTE) 0.5 (H) 0.0 - 0.4 x10E3/uL   Basophils Absolute 0.1 0.0 - 0.2 x10E3/uL   Immature Granulocytes 1 Not Estab. %   Immature Grans (Abs) 0.0 0.0 - 0.1 x10E3/uL  Comprehensive metabolic panel  Result Value Ref Range   Glucose 216 (H) 65 - 99 mg/dL   BUN 12 8 - 27 mg/dL   Creatinine, Ser 1.06 0.76 - 1.27 mg/dL   GFR calc non Af Amer 66 >59 mL/min/1.73   GFR calc Af Amer 77 >59 mL/min/1.73   BUN/Creatinine Ratio 11 10 - 24   Sodium 143 134 - 144 mmol/L   Potassium 4.6 3.5 - 5.2 mmol/L   Chloride 105 96 - 106 mmol/L   CO2 18 (L) 20 - 29 mmol/L   Calcium 8.7 8.6 - 10.2 mg/dL   Total Protein 6.3 6.0 - 8.5 g/dL   Albumin 3.4 (L) 3.7 - 4.7 g/dL   Globulin, Total 2.9 1.5 - 4.5 g/dL   Albumin/Globulin Ratio 1.2 1.2 - 2.2   Bilirubin Total 0.4 0.0 - 1.2  mg/dL   Alkaline Phosphatase 83 44 - 121 IU/L   AST 20 0 - 40 IU/L   ALT 13 0 - 44 IU/L  Lipid Panel w/o Chol/HDL Ratio  Result Value Ref Range   Cholesterol, Total 137 100 - 199 mg/dL   Triglycerides 187 (H) 0 - 149 mg/dL   HDL 35 (L) >39 mg/dL   VLDL Cholesterol Cal 32 5 - 40 mg/dL   LDL Chol Calc  (NIH) 70 0 - 99 mg/dL  TSH  Result Value Ref Range   TSH 0.455 0.450 - 4.500 uIU/mL  Iron, TIBC and Ferritin Panel  Result Value Ref Range   Total Iron Binding Capacity 232 (L) 250 - 450 ug/dL   UIBC 165 111 - 343 ug/dL   Iron 67 38 - 169 ug/dL   Iron Saturation 29 15 - 55 %   Ferritin 482 (H) 30 - 400 ng/mL  Bayer DCA Hb A1c Waived  Result Value Ref Range   HB A1C (BAYER DCA - WAIVED) 8.1 (H) <7.0 %      Assessment & Plan:   Problem List Items Addressed This Visit      Cardiovascular and Mediastinum   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HCC)    Chronic, stable with BP below goal in office.  Will continue off Lisinopril 5 MG at this time due to recent hypoxic event with Covid and concern for hypotension, could consider low dose Losartan in future for kidney proctection.  Urine ALB obtained today.  Recommend he continue to monitor BP regularly at home and document for provider.  CMP and TSH today.  Return to office in 3 months for chronic disease visit.      Relevant Orders   Comprehensive metabolic panel   TSH   Microalbumin, Urine Waived   Atherosclerosis of aorta ([place])    Ongoing, noted on lung CT in 2019.  Recommend continued use of statin daily + avoid use of nicotine products.        Respiratory   Pneumonia due to COVID-19 virus    Acute and ongoing hypoxic issues post Covid.  Continue O2 continuous daily at 3L and collaboration with pulmonary.  Will send in refills on Albuterol.  Suspect he will have long standing lung issues post Covid and will require maintenance inhaler regimen.      Relevant Medications   albuterol (VENTOLIN HFA) 108 (90 Base) MCG/ACT inhaler   Other Relevant Orders   CBC with Differential/Platelet   Acute hypoxemic respiratory failure due to COVID-19 [facility]) - Primary    Acute and ongoing hypoxic issues post Covid.  Continue O2 continuous daily at 3L and collaboration with pulmonary.  Will send in refills on  Albuterol.  Suspect he will have long standing lung issues post Covid and will require maintenance inhaler regimen.  Return in 2 weeks to review pulmonary appointment changes and recommendations.      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  Continue O2 continuous daily at 3L and collaboration with pulmonary.  Will send in refills on Albuterol.  Suspect he will have long standing lung issues post Covid and will require maintenance inhaler regimen. Return in 2 weeks to review pulmonary appointment changes and recommendations.        Endocrine   Hyperlipidemia associated with type 2 diabetes mellitus (HCC)    Chronic, ongoing.  Continue current medication regimen and adjust as needed.  Lipid panel up to date and LDL at goal.      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  A1C February 8.1%, collect urine ALB today.  Will continue Lantus 15 units BID (recommended he take this dose BID) and Metformin 1000 MG BID.  Continue off Humalog with meals at this time due to low sugars with Covid.  Check BS 4 times daily and document for visits.  Have instructed him to immediately alert provider if BS at home run <60, then will reduce insulin dosing.  Return in 3 months for A1c check.      Relevant Orders   TSH   Microalbumin, Urine Waived     Musculoskeletal and Integument   Skin ulcer of forearm with fat layer exposed ([place])    Is improving, obtained from IV while in hospital in January.  Will continue home health collaboration and place referral to wound care for further assessment due to eschar at base.      Relevant Orders   AMB referral to wound care center     Genitourinary   CKD (chronic kidney disease), stage III ([place])    Chronic, stable with no decline on recent labs. CMP today.  Urine  ALB check today.  Continue off Lisinopril at this time due to Covid hypoxic issues -- consider low dose Losartan in future for kidney protection.  Consider nephrology in future if any decline noted.      Prostate cancer [facility])    Followed by [facility] urology, continue to discuss with patient and attempt to obtain [place] records for review.        Other   Long-term insulin use ([place])    Refer to uncontrolled diabetes plan of care.      Obesity (BMI 30-39.9)    MI 30.45.  Recommended eating smaller high protein, low fat meals more frequently and exercising 30 mins a day 5 times a week with a goal of 10-15lb weight loss in the next 3 months. Patient voiced their understanding and motivation to adhere to these recommendations.        Other Visit Diagnoses    Positive ANA (antinuclear antibody)       Recheck ANA, CRP, and ESR today.  Suspect related to new lung disease post Covid.   Relevant Orders   C-reactive protein   Sed Rate (ESR)   ANA w/Reflex if Positive       Follow up plan: Return in about 2 weeks (around 09/09/2020) for Lung check and wound check.

## 2020-08-27 DIAGNOSIS — E119 Type 2 diabetes mellitus without complications: Secondary | ICD-10-CM | POA: Diagnosis not present

## 2020-08-27 DIAGNOSIS — U071 COVID-19: Secondary | ICD-10-CM | POA: Diagnosis not present

## 2020-08-27 DIAGNOSIS — N183 Chronic kidney disease, stage 3 unspecified: Secondary | ICD-10-CM | POA: Diagnosis not present

## 2020-08-27 DIAGNOSIS — R768 Other specified abnormal immunological findings in serum: Secondary | ICD-10-CM | POA: Diagnosis not present

## 2020-08-27 DIAGNOSIS — Z9981 Dependence on supplemental oxygen: Secondary | ICD-10-CM | POA: Diagnosis not present

## 2020-08-27 DIAGNOSIS — Z7952 Long term (current) use of systemic steroids: Secondary | ICD-10-CM | POA: Diagnosis not present

## 2020-08-27 DIAGNOSIS — J9601 Acute respiratory failure with hypoxia: Secondary | ICD-10-CM | POA: Diagnosis not present

## 2020-08-27 DIAGNOSIS — N400 Enlarged prostate without lower urinary tract symptoms: Secondary | ICD-10-CM | POA: Diagnosis not present

## 2020-08-27 DIAGNOSIS — C61 Malignant neoplasm of prostate: Secondary | ICD-10-CM | POA: Diagnosis not present

## 2020-08-27 DIAGNOSIS — Z23 Encounter for immunization: Secondary | ICD-10-CM | POA: Diagnosis not present

## 2020-08-27 DIAGNOSIS — Z8616 Personal history of COVID-19: Secondary | ICD-10-CM | POA: Diagnosis not present

## 2020-08-27 DIAGNOSIS — R0902 Hypoxemia: Secondary | ICD-10-CM | POA: Diagnosis not present

## 2020-08-27 DIAGNOSIS — J849 Interstitial pulmonary disease, unspecified: Secondary | ICD-10-CM | POA: Diagnosis not present

## 2020-08-27 LAB — CBC WITH DIFFERENTIAL/PLATELET
Basophils Absolute: 0.1 10*3/uL (ref 0.0–0.2)
Basos: 1 %
EOS (ABSOLUTE): 0.3 10*3/uL (ref 0.0–0.4)
Eos: 3 %
Hematocrit: 41.7 % (ref 37.5–51.0)
Hemoglobin: 13.4 g/dL (ref 13.0–17.7)
Immature Grans (Abs): 0 10*3/uL (ref 0.0–0.1)
Immature Granulocytes: 0 %
Lymphocytes Absolute: 2.6 10*3/uL (ref 0.7–3.1)
Lymphs: 24 %
MCH: 30 pg (ref 26.6–33.0)
MCHC: 32.1 g/dL (ref 31.5–35.7)
MCV: 94 fL (ref 79–97)
Monocytes Absolute: 0.7 10*3/uL (ref 0.1–0.9)
Monocytes: 7 %
Neutrophils Absolute: 7 10*3/uL (ref 1.4–7.0)
Neutrophils: 65 %
Platelets: 302 10*3/uL (ref 150–450)
RBC: 4.46 x10E6/uL (ref 4.14–5.80)
RDW: 14.3 % (ref 11.6–15.4)
WBC: 10.8 10*3/uL (ref 3.4–10.8)

## 2020-08-27 LAB — COMPREHENSIVE METABOLIC PANEL
ALT: 14 IU/L (ref 0–44)
AST: 15 IU/L (ref 0–40)
Albumin/Globulin Ratio: 1.8 (ref 1.2–2.2)
Albumin: 4.2 g/dL (ref 3.7–4.7)
Alkaline Phosphatase: 73 IU/L (ref 44–121)
BUN/Creatinine Ratio: 12 (ref 10–24)
BUN: 12 mg/dL (ref 8–27)
Bilirubin Total: 0.6 mg/dL (ref 0.0–1.2)
CO2: 24 mmol/L (ref 20–29)
Calcium: 9.8 mg/dL (ref 8.6–10.2)
Chloride: 99 mmol/L (ref 96–106)
Creatinine, Ser: 1.04 mg/dL (ref 0.76–1.27)
Globulin, Total: 2.4 g/dL (ref 1.5–4.5)
Glucose: 248 mg/dL — ABNORMAL HIGH (ref 65–99)
Potassium: 4.6 mmol/L (ref 3.5–5.2)
Sodium: 140 mmol/L (ref 134–144)
Total Protein: 6.6 g/dL (ref 6.0–8.5)
eGFR: 73 mL/min/{1.73_m2} (ref >59)

## 2020-08-27 LAB — ANA W/REFLEX IF POSITIVE: Anti Nuclear Antibody (ANA): NEGATIVE

## 2020-08-27 LAB — TSH: TSH: 0.775 u[IU]/mL (ref 0.450–4.500)

## 2020-08-27 LAB — SEDIMENTATION RATE: Sed Rate: 10 mm/hr (ref 0–30)

## 2020-08-27 LAB — C-REACTIVE PROTEIN: CRP: 4 mg/L (ref 0–10)

## 2020-08-27 NOTE — Progress Notes (Signed)
Good morning, please let Mr. Gancarz know his labs have returned and are actually showing improvement.  Kidney function remains stable and in normal range.  CBC shows no anemia or elevation in white blood cell count.  Thyroid testing is normal.  Inflammatory markers and ANA testing which were abnormal during recent hospitalization are now normal.  This is all wonderful news and reassuring.  If any questions let me know.  Have a great day!! Keep being awesome!!  Thank you for allowing me to participate in your care. Kindest regards, Elaiza Shoberg

## 2020-08-28 ENCOUNTER — Telehealth: Payer: Self-pay | Admitting: Nurse Practitioner

## 2020-08-28 DIAGNOSIS — E119 Type 2 diabetes mellitus without complications: Secondary | ICD-10-CM | POA: Diagnosis not present

## 2020-08-28 DIAGNOSIS — N400 Enlarged prostate without lower urinary tract symptoms: Secondary | ICD-10-CM | POA: Diagnosis not present

## 2020-08-28 DIAGNOSIS — J479 Bronchiectasis, uncomplicated: Secondary | ICD-10-CM | POA: Diagnosis not present

## 2020-08-28 DIAGNOSIS — K219 Gastro-esophageal reflux disease without esophagitis: Secondary | ICD-10-CM | POA: Diagnosis not present

## 2020-08-28 DIAGNOSIS — I10 Essential (primary) hypertension: Secondary | ICD-10-CM | POA: Diagnosis not present

## 2020-08-28 DIAGNOSIS — U071 COVID-19: Secondary | ICD-10-CM | POA: Diagnosis not present

## 2020-08-28 DIAGNOSIS — E1169 Type 2 diabetes mellitus with other specified complication: Secondary | ICD-10-CM | POA: Diagnosis not present

## 2020-08-28 DIAGNOSIS — J9601 Acute respiratory failure with hypoxia: Secondary | ICD-10-CM | POA: Diagnosis not present

## 2020-08-28 DIAGNOSIS — E785 Hyperlipidemia, unspecified: Secondary | ICD-10-CM | POA: Diagnosis not present

## 2020-08-28 NOTE — Telephone Encounter (Signed)
Can you assist with this?  Other company to obtain tank?

## 2020-08-28 NOTE — Telephone Encounter (Signed)
Estill Bamberg (PT) from [facility] is requesting to have the patient change providers for his oxygen tank. She reports that they are not corresponding with their requests for a new tank   Best contact: [phone]  Requesting a new company for oxygen, the company he is currently with is [employer].   Requesting to know if the office has any large oxygen tanks, please advise

## 2020-08-28 NOTE — Telephone Encounter (Signed)
Spoke to Scott Lawson. They are faxing over an order form for [place] to fill out.

## 2020-08-29 DIAGNOSIS — K449 Diaphragmatic hernia without obstruction or gangrene: Secondary | ICD-10-CM | POA: Diagnosis not present

## 2020-08-29 DIAGNOSIS — I2510 Atherosclerotic heart disease of native coronary artery without angina pectoris: Secondary | ICD-10-CM | POA: Diagnosis not present

## 2020-08-29 DIAGNOSIS — J479 Bronchiectasis, uncomplicated: Secondary | ICD-10-CM | POA: Diagnosis not present

## 2020-08-29 DIAGNOSIS — J984 Other disorders of lung: Secondary | ICD-10-CM | POA: Diagnosis not present

## 2020-08-29 DIAGNOSIS — U071 COVID-19: Secondary | ICD-10-CM | POA: Diagnosis not present

## 2020-08-29 DIAGNOSIS — E279 Disorder of adrenal gland, unspecified: Secondary | ICD-10-CM | POA: Diagnosis not present

## 2020-08-29 DIAGNOSIS — R918 Other nonspecific abnormal finding of lung field: Secondary | ICD-10-CM | POA: Diagnosis not present

## 2020-08-29 DIAGNOSIS — J9601 Acute respiratory failure with hypoxia: Secondary | ICD-10-CM | POA: Diagnosis not present

## 2020-08-29 DIAGNOSIS — I728 Aneurysm of other specified arteries: Secondary | ICD-10-CM | POA: Diagnosis not present

## 2020-08-29 NOTE — Telephone Encounter (Signed)
Order form received and filled out. Placed in providers folder for signature.

## 2020-08-29 NOTE — Telephone Encounter (Signed)
Paperwork signed by Dr.Jolene and faxed back over.

## 2020-08-30 DIAGNOSIS — I10 Essential (primary) hypertension: Secondary | ICD-10-CM | POA: Diagnosis not present

## 2020-08-30 DIAGNOSIS — E119 Type 2 diabetes mellitus without complications: Secondary | ICD-10-CM | POA: Diagnosis not present

## 2020-08-30 DIAGNOSIS — J479 Bronchiectasis, uncomplicated: Secondary | ICD-10-CM | POA: Diagnosis not present

## 2020-08-30 DIAGNOSIS — E785 Hyperlipidemia, unspecified: Secondary | ICD-10-CM | POA: Diagnosis not present

## 2020-08-30 DIAGNOSIS — E1169 Type 2 diabetes mellitus with other specified complication: Secondary | ICD-10-CM | POA: Diagnosis not present

## 2020-08-30 DIAGNOSIS — J9601 Acute respiratory failure with hypoxia: Secondary | ICD-10-CM | POA: Diagnosis not present

## 2020-08-30 DIAGNOSIS — N400 Enlarged prostate without lower urinary tract symptoms: Secondary | ICD-10-CM | POA: Diagnosis not present

## 2020-08-30 DIAGNOSIS — K219 Gastro-esophageal reflux disease without esophagitis: Secondary | ICD-10-CM | POA: Diagnosis not present

## 2020-08-30 DIAGNOSIS — U071 COVID-19: Secondary | ICD-10-CM | POA: Diagnosis not present

## 2020-09-02 DIAGNOSIS — E785 Hyperlipidemia, unspecified: Secondary | ICD-10-CM | POA: Diagnosis not present

## 2020-09-02 DIAGNOSIS — I10 Essential (primary) hypertension: Secondary | ICD-10-CM | POA: Diagnosis not present

## 2020-09-02 DIAGNOSIS — U071 COVID-19: Secondary | ICD-10-CM | POA: Diagnosis not present

## 2020-09-02 DIAGNOSIS — J479 Bronchiectasis, uncomplicated: Secondary | ICD-10-CM | POA: Diagnosis not present

## 2020-09-02 DIAGNOSIS — N400 Enlarged prostate without lower urinary tract symptoms: Secondary | ICD-10-CM | POA: Diagnosis not present

## 2020-09-02 DIAGNOSIS — E119 Type 2 diabetes mellitus without complications: Secondary | ICD-10-CM | POA: Diagnosis not present

## 2020-09-02 DIAGNOSIS — K219 Gastro-esophageal reflux disease without esophagitis: Secondary | ICD-10-CM | POA: Diagnosis not present

## 2020-09-02 DIAGNOSIS — J9601 Acute respiratory failure with hypoxia: Secondary | ICD-10-CM | POA: Diagnosis not present

## 2020-09-02 DIAGNOSIS — E1169 Type 2 diabetes mellitus with other specified complication: Secondary | ICD-10-CM | POA: Diagnosis not present

## 2020-09-04 DIAGNOSIS — J9601 Acute respiratory failure with hypoxia: Secondary | ICD-10-CM | POA: Diagnosis not present

## 2020-09-04 DIAGNOSIS — E1169 Type 2 diabetes mellitus with other specified complication: Secondary | ICD-10-CM | POA: Diagnosis not present

## 2020-09-04 DIAGNOSIS — J479 Bronchiectasis, uncomplicated: Secondary | ICD-10-CM | POA: Diagnosis not present

## 2020-09-04 DIAGNOSIS — I10 Essential (primary) hypertension: Secondary | ICD-10-CM | POA: Diagnosis not present

## 2020-09-04 DIAGNOSIS — E119 Type 2 diabetes mellitus without complications: Secondary | ICD-10-CM | POA: Diagnosis not present

## 2020-09-04 DIAGNOSIS — K219 Gastro-esophageal reflux disease without esophagitis: Secondary | ICD-10-CM | POA: Diagnosis not present

## 2020-09-04 DIAGNOSIS — E785 Hyperlipidemia, unspecified: Secondary | ICD-10-CM | POA: Diagnosis not present

## 2020-09-04 DIAGNOSIS — N400 Enlarged prostate without lower urinary tract symptoms: Secondary | ICD-10-CM | POA: Diagnosis not present

## 2020-09-04 DIAGNOSIS — U071 COVID-19: Secondary | ICD-10-CM | POA: Diagnosis not present

## 2020-09-04 NOTE — Telephone Encounter (Signed)
Estill Bamberg was just following up on the Oxygen order, wanted to make sure all the faxes were sent so the Pt has his oxygen tanks.

## 2020-09-04 NOTE — Telephone Encounter (Signed)
Paperwork was faxed over to [employer].

## 2020-09-11 ENCOUNTER — Ambulatory Visit: Payer: Medicare HMO | Admitting: Nurse Practitioner

## 2020-09-13 ENCOUNTER — Other Ambulatory Visit: Payer: Self-pay | Admitting: Nurse Practitioner

## 2020-09-13 MED ORDER — OMEPRAZOLE 20 MG PO CPDR: 20.0000 mg | DELAYED_RELEASE_CAPSULE | Freq: Every day | ORAL | 0 refills | Status: DC

## 2020-09-13 NOTE — Telephone Encounter (Signed)
Medication Refill - Medication: Prilosec generic 20 mg  Has the patient contacted their pharmacy? Yes.    He called saying it is the medication that she prescribed that helps his stomach.  He called his local pharmacy and they told him to call his primary..  He said it helps his stomach ulcers.  (Agent: If yes, when and what did the pharmacy advise?)  Preferred Pharmacy (with phone number or street name): [facility]  Agent: Please be advised that RX refills may take up to 3 business days. We ask that you follow-up with your pharmacy.

## 2020-09-27 ENCOUNTER — Other Ambulatory Visit: Payer: Self-pay | Admitting: Nurse Practitioner

## 2020-09-27 DIAGNOSIS — U071 COVID-19: Secondary | ICD-10-CM | POA: Diagnosis not present

## 2020-09-27 DIAGNOSIS — N183 Chronic kidney disease, stage 3 unspecified: Secondary | ICD-10-CM | POA: Diagnosis not present

## 2020-09-27 DIAGNOSIS — C61 Malignant neoplasm of prostate: Secondary | ICD-10-CM | POA: Diagnosis not present

## 2020-09-27 NOTE — Progress Notes (Signed)
Added by [facility] pulmonary medications

## 2020-09-30 ENCOUNTER — Ambulatory Visit (INDEPENDENT_AMBULATORY_CARE_PROVIDER_SITE_OTHER): Payer: Medicare HMO | Admitting: Nurse Practitioner

## 2020-09-30 ENCOUNTER — Other Ambulatory Visit: Payer: Self-pay

## 2020-09-30 ENCOUNTER — Ambulatory Visit: Payer: Medicare HMO | Admitting: Nurse Practitioner

## 2020-09-30 ENCOUNTER — Encounter: Payer: Self-pay | Admitting: Nurse Practitioner

## 2020-09-30 VITALS — BP 109/69 | HR 86 | Temp 97.9°F | Wt 209.6 lb

## 2020-09-30 DIAGNOSIS — J9601 Acute respiratory failure with hypoxia: Secondary | ICD-10-CM | POA: Diagnosis not present

## 2020-09-30 DIAGNOSIS — L98492 Non-pressure chronic ulcer of skin of other sites with fat layer exposed: Secondary | ICD-10-CM

## 2020-09-30 DIAGNOSIS — K219 Gastro-esophageal reflux disease without esophagitis: Secondary | ICD-10-CM | POA: Diagnosis not present

## 2020-09-30 DIAGNOSIS — U071 COVID-19: Secondary | ICD-10-CM | POA: Diagnosis not present

## 2020-09-30 MED ORDER — OMEPRAZOLE 20 MG PO CPDR: 20.0000 mg | DELAYED_RELEASE_CAPSULE | Freq: Every day | ORAL | 0 refills | Status: DC

## 2020-09-30 NOTE — Assessment & Plan Note (Signed)
Controlled with current regimen. Refills of prilosec sent to pharmacy.

## 2020-09-30 NOTE — Assessment & Plan Note (Addendum)
Following with pulmonology and following their treatment regimen with some improvement. Reviewed pulmonologist's notes. Discussed that the prednisone daily could increase his blood sugars, however they are currently controlled with am sugar checks ranging 110-130 at home. Still using 3L oxygen around the clock. Has enough oxygen tanks at home. Continue current regimen and closely following with pulmonology. Will follow-up in 1 month or sooner with any concerns.

## 2020-09-30 NOTE — Progress Notes (Signed)
Established Patient Office Visit  Subjective:  Patient ID: Scott Roof., male    DOB: May 12, 1941  Age: 80 y.o. MRN: [id number]  CC:  Chief Complaint  Patient presents with  . Lung Check    Patient states his breathing is still giving him issues and he has an upcoming appointment with lung specialist.   . Wound Check    Patient states his wound is now gone.   . Medication Refill    Patient states he is requesting a refill on his stomach ulcer medication.     HPI Scott Roof. presents for follow-up on his interstitial lung disease, wound on his left arm, and gerd.   INTERSTITIAL LUNG DISEASE  Been following up with pulmonologist. He uses 3L of oxygen all the time and has plenty of tanks at home. His pulmonologist started him on inhalers twice a day, along with prednisone 20mg  daily and bactrim Monday, Wednesday, and Friday. He has noticed some improvement so far, although he does still get short of breath with exertion. His blood sugars 110-130 in the mornings, even after starting the prednisone.   WOUND   He has been using manuka honey to the ulcer on his left forearm and it is almost healed. Denies fevers, warmth to site.    Past Medical History:  Diagnosis Date  . BPH (benign prostatic hyperplasia)   . Diabetes mellitus without complication ([place])   . Elevated PSA   . GERD (gastroesophageal reflux disease)   . Hypertension   . Hypertriglyceridemia   . Nocturia     Past Surgical History:  Procedure Laterality Date  . APPENDECTOMY    . CARPAL TUNNEL RELEASE Left 12/11/2015   Procedure: CARPAL TUNNEL RELEASE;  Surgeon: Earnestine Leys, MD;  Location: [facility] ORS;  Service: Orthopedics;  Laterality: Left;  . EYE SURGERY     laser  . TRANSURETHRAL RESECTION OF PROSTATE N/A 08/02/2017   Procedure: TRANSURETHRAL RESECTION OF THE PROSTATE (TURP);  Surgeon: Hollice Espy, MD;  Location: [facility] ORS;  Service: Urology;  Laterality: N/A;    Family History  Problem  Relation Age of Onset  . Cancer Mother        lung  . Heart disease Father   . Diabetes Brother   . Prostate cancer Neg Hx   . Bladder Cancer Neg Hx   . Kidney cancer Neg Hx     Social History   Socioeconomic History  . Marital status: Married    Spouse name: Not on file  . Number of children: Not on file  . Years of education: Not on file  . Highest education level: High school graduate  Occupational History  . Occupation: retired  Tobacco Use  . Smoking status: Never Smoker  . Smokeless tobacco: Never Used  Vaping Use  . Vaping Use: Never used  Substance and Sexual Activity  . Alcohol use: No    Alcohol/week: 0.0 standard drinks  . Drug use: No  . Sexual activity: Yes  Other Topics Concern  . Not on file  Social History Narrative  . Not on file   Social Determinants of Health   Financial Resource Strain: Not on file  Food Insecurity: No Food Insecurity  . Worried About Charity fundraiser in the Last Year: Never true  . Ran Out of Food in the Last Year: Never true  Transportation Needs: No Transportation Needs  . Lack of Transportation (Medical): No  . Lack of Transportation (Non-Medical): No  Physical  Activity: Inactive  . Days of Exercise per Week: 0 days  . Minutes of Exercise per Session: 0 min  Stress: Not on file  Social Connections: Socially Isolated  . Frequency of Communication with Friends and Family: More than three times a week  . Frequency of Social Gatherings with Friends and Family: More than three times a week  . Attends Religious Services: Never  . Active Member of Clubs or Organizations: No  . Attends Archivist Meetings: Never  . Marital Status: Widowed  Intimate Partner Violence: Not on file    Outpatient Medications Prior to Visit  Medication Sig Dispense Refill  . albuterol (VENTOLIN HFA) 108 (90 Base) MCG/ACT inhaler Inhale 2 puffs into the lungs every 6 (six) hours as needed for wheezing or shortness of breath. 18 g 4  .  atorvastatin (LIPITOR) 80 MG tablet TAKE 1 TABLET EVERY DAY 90 tablet 0  . fluticasone (FLONASE) 50 MCG/ACT nasal spray Place 2 sprays into both nostrils daily as needed.     Marland Kitchen glipiZIDE (GLUCOTROL) 10 MG tablet Take 10 mg by mouth daily before breakfast.    . insulin glargine (LANTUS) 100 UNIT/ML injection Inject 0.15 mLs (15 Units total) into the skin every evening. 1800 10 mL 11  . insulin lispro (HUMALOG KWIKPEN) 100 UNIT/ML KwikPen Inject 5 Units into the skin 3 (three) times daily before meals. Continue to check blood sugar after meals. 15 mL 5  . lisinopril (ZESTRIL) 5 MG tablet Take 5 mg by mouth daily.    . metFORMIN (GLUCOPHAGE) 500 MG tablet Take 2 tablets (1,000 mg total) by mouth 2 (two) times daily with a meal. 1000mg  in the morning 1000mg  at night 360 tablet 4  . predniSONE (DELTASONE) 20 MG tablet Take 1 tablet by mouth daily.    Marland Kitchen sulfamethoxazole-trimethoprim (BACTRIM DS) 800-160 MG tablet Take 1 tablet by mouth See admin instructions. On Monday, Wednesday, Friday    . tamsulosin (FLOMAX) 0.4 MG CAPS capsule TAKE 1 CAPSULE EVERY DAY 90 capsule 2  . TRUE METRIX BLOOD GLUCOSE TEST test strip CHECK BLOOD SUGAR TWICE DAILY 200 strip 3  . TRUEplus Lancets 33G MISC CHECK BLOOD SUGAR TWICE DAILY 200 each 4  . omeprazole (PRILOSEC) 20 MG capsule Take 1 capsule (20 mg total) by mouth daily. 90 capsule 0  . aspirin 81 MG tablet Take 81 mg by mouth daily.  (Patient not taking: No sig reported)    . meloxicam (MOBIC) 15 MG tablet Take 15 mg by mouth daily. (Patient not taking: Reported on 09/30/2020)     No facility-administered medications prior to visit.    Allergies  Allergen Reactions  . Gabapentin Other (See Comments)    "made me fall out"    ROS Review of Systems  Constitutional: Positive for fatigue. Negative for fever.  HENT: Negative.   Respiratory: Positive for shortness of breath (with exertion).   Cardiovascular: Negative.   Gastrointestinal:       Has a history of  GERD, needs refills on his medication  Genitourinary: Negative.   Skin: Positive for wound (healing, see HPI).  Neurological: Negative.   Psychiatric/Behavioral: Negative.       Objective:    Physical Exam Vitals and nursing note reviewed.  Constitutional:      Appearance: Normal appearance.  HENT:     Head: Normocephalic.  Cardiovascular:     Rate and Rhythm: Normal rate and regular rhythm.     Pulses: Normal pulses.     Heart sounds:  Normal heart sounds.  Pulmonary:     Effort: Pulmonary effort is normal.     Breath sounds: Normal breath sounds.     Comments: Shortness of breath after walking to the room Musculoskeletal:     Cervical back: Normal range of motion.  Skin:    General: Skin is warm and dry.     Comments: Mostly healed ulcer to his left forearm. There is one small pinpoint spot appearing like a scab. All other areas healed. No necrosis or eschar.  Neurological:     General: No focal deficit present.     Mental Status: He is alert and oriented to person, place, and time.     BP 109/69   Pulse 86   Temp 97.9 F (36.6 C) (Oral)   Wt 209 lb 9.6 oz (95.1 kg)   SpO2 95%   BMI 30.95 kg/m  Wt Readings from Last 3 Encounters:  09/30/20 209 lb 9.6 oz (95.1 kg)  08/26/20 206 lb 3.2 oz (93.5 kg)  08/02/20 204 lb (92.5 kg)     Health Maintenance Due  Topic Date Due  . OPHTHALMOLOGY EXAM  09/28/2018  . COVID-19 Vaccine (3 - Pfizer risk 4-dose series) 09/24/2020    There are no preventive care reminders to display for this patient.  Lab Results  Component Value Date   TSH 0.775 08/26/2020   Lab Results  Component Value Date   WBC 10.8 08/26/2020   HGB 13.4 08/26/2020   HCT 41.7 08/26/2020   MCV 94 08/26/2020   PLT 302 08/26/2020   Lab Results  Component Value Date   NA 140 08/26/2020   K 4.6 08/26/2020   CO2 24 08/26/2020   GLUCOSE 248 (H) 08/26/2020   BUN 12 08/26/2020   CREATININE 1.04 08/26/2020   BILITOT 0.6 08/26/2020   ALKPHOS 73  08/26/2020   AST 15 08/26/2020   ALT 14 08/26/2020   PROT 6.6 08/26/2020   ALBUMIN 4.2 08/26/2020   CALCIUM 9.8 08/26/2020   ANIONGAP 8 07/19/2020   Lab Results  Component Value Date   CHOL 137 08/02/2020   Lab Results  Component Value Date   HDL 35 (L) 08/02/2020   Lab Results  Component Value Date   LDLCALC 70 08/02/2020   Lab Results  Component Value Date   TRIG 187 (H) 08/02/2020   No results found for: CHOLHDL Lab Results  Component Value Date   HGBA1C 8.1 (H) 08/02/2020      Assessment & Plan:   Problem List Items Addressed This Visit      Respiratory   Acute hypoxemic respiratory failure due to COVID-19 [facility])    Following with pulmonology and following their treatment regimen with some improvement. Reviewed pulmonologist's notes. Discussed that the prednisone daily could increase his blood sugars, however they are currently controlled with am sugar checks ranging 110-130 at home. Still using 3L oxygen around the clock. Has enough oxygen tanks at home. Continue current regimen and closely following with pulmonology. Will follow-up in 1 month or sooner with any concerns.        Digestive   GERD (gastroesophageal reflux disease)    Controlled with current regimen. Refills of prilosec sent to pharmacy.       Relevant Medications   omeprazole (PRILOSEC) 20 MG capsule     Musculoskeletal and Integument   Skin ulcer of forearm with fat layer exposed ([place]) - Primary    Mostly healed. Continue Manuka honey treatment at home. No signs of infection.  Will recheck in 1 month.          Meds ordered this encounter  Medications  . omeprazole (PRILOSEC) 20 MG capsule    Sig: Take 1 capsule (20 mg total) by mouth daily.    Dispense:  90 capsule    Refill:  0    Follow-up: Return in about 4 weeks (around 10/28/2020) for htn, ckd, dm, lungs.    Charyl Dancer, NP

## 2020-09-30 NOTE — Patient Instructions (Signed)
[facility] Phone: [phone]

## 2020-09-30 NOTE — Assessment & Plan Note (Signed)
Mostly healed. Continue Manuka honey treatment at home. No signs of infection. Will recheck in 1 month.

## 2020-10-05 DIAGNOSIS — U071 COVID-19: Secondary | ICD-10-CM | POA: Diagnosis not present

## 2020-10-05 DIAGNOSIS — J9601 Acute respiratory failure with hypoxia: Secondary | ICD-10-CM | POA: Diagnosis not present

## 2020-10-18 ENCOUNTER — Other Ambulatory Visit: Payer: Self-pay | Admitting: Nurse Practitioner

## 2020-10-26 ENCOUNTER — Encounter: Payer: Self-pay | Admitting: Nurse Practitioner

## 2020-10-26 DIAGNOSIS — Z8616 Personal history of COVID-19: Secondary | ICD-10-CM | POA: Insufficient documentation

## 2020-10-26 DIAGNOSIS — J439 Emphysema, unspecified: Secondary | ICD-10-CM | POA: Insufficient documentation

## 2020-10-26 DIAGNOSIS — R768 Other specified abnormal immunological findings in serum: Secondary | ICD-10-CM | POA: Insufficient documentation

## 2020-10-27 DIAGNOSIS — C61 Malignant neoplasm of prostate: Secondary | ICD-10-CM | POA: Diagnosis not present

## 2020-10-27 DIAGNOSIS — N183 Chronic kidney disease, stage 3 unspecified: Secondary | ICD-10-CM | POA: Diagnosis not present

## 2020-10-27 DIAGNOSIS — U071 COVID-19: Secondary | ICD-10-CM | POA: Diagnosis not present

## 2020-10-29 ENCOUNTER — Ambulatory Visit: Payer: Medicare HMO | Admitting: Nurse Practitioner

## 2020-11-01 ENCOUNTER — Other Ambulatory Visit: Payer: Self-pay | Admitting: Nurse Practitioner

## 2020-11-01 NOTE — Telephone Encounter (Signed)
Requested Prescriptions  Pending Prescriptions Disp Refills  . 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

## 2020-11-04 DIAGNOSIS — J9601 Acute respiratory failure with hypoxia: Secondary | ICD-10-CM | POA: Diagnosis not present

## 2020-11-04 DIAGNOSIS — U071 COVID-19: Secondary | ICD-10-CM | POA: Diagnosis not present

## 2020-11-05 ENCOUNTER — Ambulatory Visit: Payer: Medicare HMO | Admitting: Nurse Practitioner

## 2020-11-12 ENCOUNTER — Ambulatory Visit: Payer: Medicare HMO | Admitting: Nurse Practitioner

## 2020-11-18 ENCOUNTER — Telehealth: Payer: Self-pay | Admitting: Nurse Practitioner

## 2020-11-18 NOTE — Telephone Encounter (Signed)
Copied from [place] [id number]. Topic: Medicare AWV >> Nov 18, 2020 11:02 AM Cher Nakai R wrote: Reason for CRM:  6/6 LM re: Need to r/s AWV from 6/16 to 6/17 @10 :30  to be done by phone not in the office -srs

## 2020-11-26 ENCOUNTER — Encounter: Payer: Self-pay | Admitting: Nurse Practitioner

## 2020-11-26 ENCOUNTER — Other Ambulatory Visit: Payer: Self-pay

## 2020-11-26 ENCOUNTER — Ambulatory Visit (INDEPENDENT_AMBULATORY_CARE_PROVIDER_SITE_OTHER): Payer: Medicare HMO | Admitting: Nurse Practitioner

## 2020-11-26 VITALS — BP 110/71 | HR 90 | Temp 98.1°F | Wt 201.2 lb

## 2020-11-26 DIAGNOSIS — R768 Other specified abnormal immunological findings in serum: Secondary | ICD-10-CM | POA: Diagnosis not present

## 2020-11-26 DIAGNOSIS — E1169 Type 2 diabetes mellitus with other specified complication: Secondary | ICD-10-CM | POA: Diagnosis not present

## 2020-11-26 DIAGNOSIS — C61 Malignant neoplasm of prostate: Secondary | ICD-10-CM | POA: Diagnosis not present

## 2020-11-26 DIAGNOSIS — I1310 Hypertensive heart and chronic kidney disease without heart failure, with stage 1 through stage 4 chronic kidney disease, or unspecified chronic kidney disease: Secondary | ICD-10-CM | POA: Diagnosis not present

## 2020-11-26 DIAGNOSIS — E1165 Type 2 diabetes mellitus with hyperglycemia: Secondary | ICD-10-CM

## 2020-11-26 DIAGNOSIS — J432 Centrilobular emphysema: Secondary | ICD-10-CM | POA: Diagnosis not present

## 2020-11-26 DIAGNOSIS — J479 Bronchiectasis, uncomplicated: Secondary | ICD-10-CM | POA: Diagnosis not present

## 2020-11-26 DIAGNOSIS — N1831 Chronic kidney disease, stage 3a: Secondary | ICD-10-CM | POA: Diagnosis not present

## 2020-11-26 DIAGNOSIS — I700 Atherosclerosis of aorta: Secondary | ICD-10-CM | POA: Diagnosis not present

## 2020-11-26 DIAGNOSIS — J9611 Chronic respiratory failure with hypoxia: Secondary | ICD-10-CM

## 2020-11-26 DIAGNOSIS — E1122 Type 2 diabetes mellitus with diabetic chronic kidney disease: Secondary | ICD-10-CM | POA: Diagnosis not present

## 2020-11-26 DIAGNOSIS — E785 Hyperlipidemia, unspecified: Secondary | ICD-10-CM

## 2020-11-26 DIAGNOSIS — J849 Interstitial pulmonary disease, unspecified: Secondary | ICD-10-CM

## 2020-11-26 DIAGNOSIS — IMO0002 Reserved for concepts with insufficient information to code with codable children: Secondary | ICD-10-CM

## 2020-11-26 DIAGNOSIS — Z794 Long term (current) use of insulin: Secondary | ICD-10-CM

## 2020-11-26 LAB — MICROALBUMIN, URINE WAIVED
Creatinine, Urine Waived: 50 mg/dL (ref 10–300)
Microalb, Ur Waived: 10 mg/L (ref 0–19)

## 2020-11-26 LAB — BAYER DCA HB A1C WAIVED: HB A1C (BAYER DCA - WAIVED): 13.9 % — ABNORMAL HIGH (ref <7.0)

## 2020-11-26 MED ORDER — INSULIN LISPRO (1 UNIT DIAL) 100 UNIT/ML (KWIKPEN): 10.0000 [IU] | PEN_INJECTOR | Freq: Three times a day (TID) | SUBCUTANEOUS | 5 refills | Status: DC

## 2020-11-26 MED ORDER — INSULIN GLARGINE 100 UNIT/ML ~~LOC~~ SOLN: 25.0000 [IU] | Freq: Every evening | SUBCUTANEOUS | 11 refills | Status: DC

## 2020-11-26 NOTE — Assessment & Plan Note (Signed)
Repeat today and if continues negative no further checks -- ESR and CRP recheck today too.

## 2020-11-26 NOTE — Assessment & Plan Note (Signed)
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  A1C today 13.9%, collect urine ALB today.  Will increase Lantus to 25 units daily and Metformin 1000 MG BID + Glipizide in morning (although would in long term like to discontinue this due to age).  Increase Humalog to 10 units before meals.  Check BS 4 times daily and document for visits, bring to next visit.  Have instructed him to immediately alert provider if BS at home run <70, then will reduce insulin dosing.  Referral to endocrinology placed due to ongoing poor control.  Return in 4 weeks.

## 2020-11-26 NOTE — Patient Instructions (Signed)
Lantus increase this to 25 units QHS and increase pre meal to 10 units (do not take pre meal if you do not eat or if pre meal sugar is <70)  Diabetes Mellitus and Nutrition, Adult When you have diabetes, or diabetes mellitus, it is very important to have healthy eating habits because your blood sugar (glucose) levels are greatly affected by what you eat and drink. Eating healthy foods in the right amounts, at about the same times every day, can help you: Control your blood glucose. Lower your risk of heart disease. Improve your blood pressure. Reach or maintain a healthy weight. What can affect my meal plan? Every person with diabetes is different, and each person has different needs for a meal plan. Your health care provider may recommend that you work with a dietitian to make a meal plan that is best for you. Your meal plan may vary depending on factors such as: The calories you need. The medicines you take. Your weight. Your blood glucose, blood pressure, and cholesterol levels. Your activity level. Other health conditions you have, such as heart or kidney disease. How do carbohydrates affect me? Carbohydrates, also called carbs, affect your blood glucose level more than any other type of food. Eating carbs naturally raises the amount of glucose in your blood. Carb counting is a method for keeping track of how many carbs you eat. Counting carbs is important to keep your blood glucose at a healthy level,especially if you use insulin or take certain oral diabetes medicines. It is important to know how many carbs you can safely have in each meal. This is different for every person. Your dietitian can help you calculate how manycarbs you should have at each meal and for each snack. How does alcohol affect me? Alcohol can cause a sudden decrease in blood glucose (hypoglycemia), especially if you use insulin or take certain oral diabetes medicines. Hypoglycemia can be a life-threatening condition.  Symptoms of hypoglycemia, such as sleepiness, dizziness, and confusion, are similar to symptoms of having too much alcohol. Do not drink alcohol if: Your health care provider tells you not to drink. You are pregnant, may be pregnant, or are planning to become pregnant. If you drink alcohol: Do not drink on an empty stomach. Limit how much you use to: 0-1 drink a day for women. 0-2 drinks a day for men. Be aware of how much alcohol is in your drink. In the [place] one drink equals one 12 oz bottle of beer (355 mL), one 5 oz glass of wine (148 mL), or one 1 oz glass of hard liquor (44 mL). Keep yourself hydrated with water, diet soda, or unsweetened iced tea. Keep in mind that regular soda, juice, and other mixers may contain a lot of sugar and must be counted as carbs. What are tips for following this plan?  Reading food labels Start by checking the serving size on the "Nutrition Facts" label of packaged foods and drinks. The amount of calories, carbs, fats, and other nutrients listed on the label is based on one serving of the item. Many items contain more than one serving per package. Check the total grams (g) of carbs in one serving. You can calculate the number of servings of carbs in one serving by dividing the total carbs by 15. For example, if a food has 30 g of total carbs per serving, it would be equal to 2 servings of carbs. Check the number of grams (g) of saturated fats and trans fats  in one serving. Choose foods that have a low amount or none of these fats. Check the number of milligrams (mg) of salt (sodium) in one serving. Most people should limit total sodium intake to less than 2,300 mg per day. Always check the nutrition information of foods labeled as "low-fat" or "nonfat." These foods may be higher in added sugar or refined carbs and should be avoided. Talk to your dietitian to identify your daily goals for nutrients listed on the label. Shopping Avoid buying canned, pre-made,  or processed foods. These foods tend to be high in fat, sodium, and added sugar. Shop around the outside edge of the grocery store. This is where you will most often find fresh fruits and vegetables, bulk grains, fresh meats, and fresh dairy. Cooking Use low-heat cooking methods, such as baking, instead of high-heat cooking methods like deep frying. Cook using healthy oils, such as olive, canola, or sunflower oil. Avoid cooking with butter, cream, or high-fat meats. Meal planning Eat meals and snacks regularly, preferably at the same times every day. Avoid going long periods of time without eating. Eat foods that are high in fiber, such as fresh fruits, vegetables, beans, and whole grains. Talk with your dietitian about how many servings of carbs you can eat at each meal. Eat 4-6 oz (112-168 g) of lean protein each day, such as lean meat, chicken, fish, eggs, or tofu. One ounce (oz) of lean protein is equal to: 1 oz (28 g) of meat, chicken, or fish. 1 egg.  cup (62 g) of tofu. Eat some foods each day that contain healthy fats, such as avocado, nuts, seeds, and fish. What foods should I eat? Fruits Berries. Apples. Oranges. Peaches. Apricots. Plums. Grapes. Mango. Papaya.Pomegranate. Kiwi. Cherries. Vegetables Lettuce. Spinach. Leafy greens, including kale, chard, collard greens, and mustard greens. Beets. Cauliflower. Cabbage. Broccoli. Carrots. Green beans.Tomatoes. Peppers. Onions. Cucumbers. Brussels sprouts. Grains Whole grains, such as whole-wheat or whole-grain bread, crackers, tortillas,cereal, and pasta. Unsweetened oatmeal. Quinoa. Brown or wild rice. Meats and other proteins Seafood. Poultry without skin. Lean cuts of poultry and beef. Tofu. Nuts. Seeds. Dairy Low-fat or fat-free dairy products such as milk, yogurt, and cheese. The items listed above may not be a complete list of foods and beverages you can eat. Contact a dietitian for more information. What foods should I  avoid? Fruits Fruits canned with syrup. Vegetables Canned vegetables. Frozen vegetables with butter or cream sauce. Grains Refined white flour and flour products such as bread, pasta, snack foods, andcereals. Avoid all processed foods. Meats and other proteins Fatty cuts of meat. Poultry with skin. Breaded or fried meats. Processed meat.Avoid saturated fats. Dairy Full-fat yogurt, cheese, or milk. Beverages Sweetened drinks, such as soda or iced tea. The items listed above may not be a complete list of foods and beverages you should avoid. Contact a dietitian for more information. Questions to ask a health care provider Do I need to meet with a diabetes educator? Do I need to meet with a dietitian? What number can I call if I have questions? When are the best times to check my blood glucose? Where to find more information: American Diabetes Association: diabetes.org Academy of Nutrition and Dietetics: www.eatright.[employer] of Diabetes and Digestive and Kidney Diseases: [employer] Association of Diabetes Care and Education Specialists: www.diabeteseducator.org Summary It is important to have healthy eating habits because your blood sugar (glucose) levels are greatly affected by what you eat and drink. A healthy meal plan will help you control  your blood glucose and maintain a healthy lifestyle. Your health care provider may recommend that you work with a dietitian to make a meal plan that is best for you. Keep in mind that carbohydrates (carbs) and alcohol have immediate effects on your blood glucose levels. It is important to count carbs and to use alcohol carefully. This information is not intended to replace advice given to you by your health care provider. Make sure you discuss any questions you have with your healthcare provider. Document Revised: 05/09/2019 Document Reviewed: 05/09/2019 Elsevier Patient Education  2021 [employer].

## 2020-11-26 NOTE — Assessment & Plan Note (Signed)
Chronic, ongoing.  Continue current medication regimen and adjust as needed. Lipid panel today. 

## 2020-11-26 NOTE — Assessment & Plan Note (Signed)
Chronic, stable with BP below goal in office.  Will continue Lisinopril 5 MG at this time and adjust as needed -- may change over to Losartan in future due to underlying lung disease post-Covid.  Urine ALB obtained today.  Recommend he continue to monitor BP regularly at home and document for provider.  CMP and TSH today.  Return to office in 3 months for chronic disease visit.

## 2020-11-26 NOTE — Assessment & Plan Note (Signed)
Refer to uncontrolled diabetes plan of care.

## 2020-11-26 NOTE — Progress Notes (Signed)
BP 110/71   Pulse 90   Temp 98.1 F (36.7 C) (Oral)   Wt 201 lb 3.2 oz (91.3 kg)   SpO2 95%   BMI 29.71 kg/m    Subjective:    Patient ID: Scott Roof., male    DOB: [birth year], 80 y.o.   MRN: [id number]  HPI: Scott Embleton. is a 80 y.o. male  Chief Complaint  Patient presents with   Chronic Kidney Disease   Diabetes   Lung Check      DIABETES Last A1C 8.1% in February.  Currently is taking Lantus 15 units BID and Metformin 1000 MG BID.  Currently not taking Humalog, was told to stop this due to low sugars and not start until better.  Stopped taking Victoza due to cost -- [facility] but this caused GI issues in past. Continues to see PCP at [facility] as well -- last saw 6 months ago.  Working with CCM team in office. He is checking BS 4 times a day at this time.   Dr. Meyer Russel is provider at [facility] -- [facility] [phone]. Hypoglycemic episodes:no Polydipsia/polyuria: no Visual disturbance: no Chest pain: no Paresthesias: no Glucose Monitoring: yes             Accucheck frequency: Daily             Fasting glucose: 100-110             Post prandial: 150's             Evening: 150's             Before meals: Taking Insulin?: yes             Long acting insulin: Lantus 25 units QHS             Short acting insulin: 5 units before meals Blood Pressure Monitoring: daily Retinal Examination: Not Up to Date -- [facility] in October 2019 Foot Exam: Up to Date Pneumovax: Up to Date Influenza: Up to Date Aspirin: yes    HYPERTENSION / HYPERLIPIDEMIA Continues on Lipitor 80 MG and Lisinopril 5 MG daily for kidney protection.  Recent CRT 1.04 and GFR 73.  LDL in February LDL 70.  History of positive ANA and ESR elevation. Satisfied with current treatment? yes Duration of hypertension: chronic BP monitoring frequency: daily BP range: 120-130/60-70 BP medication side effects: no Duration of hyperlipidemia: chronic Cholesterol  medication side effects: no Cholesterol supplements: none Medication compliance: good compliance Aspirin: yes Recent stressors: no Recurrent headaches: no Visual changes: no Palpitations: no Dyspnea: no Chest pain: no Lower extremity edema: no Dizzy/lightheaded: no    PROSTATE CANCER: Is followed at the [facility] in [place] -- they monitor levels.  He states they told him "too old for surgery". He reports he lost his sister, mother, father, and brother to cancer. States he continues to see [facility] providers for this.  Denies any current symptoms.  Continues on Tamsulosin.     INTERSTITIAL LUNG DISEASE Last saw pulmonary at [facility] 08/27/20.  ILD presented post-Covid with ongoing need for O2 consistently at this time.  He reports the [place] is going to take over lung care -- will be see pulmonary there next week. COPD status: stable Satisfied with current treatment?: yes Oxygen use: yes Dyspnea frequency: occasional Cough frequency: occasional Rescue inhaler frequency:  he reports he was told to take once in morning and once in afternoon Limitation of activity:  no Productive cough: none Last Spirometry: at [facility] Pneumovax: Up to Date Influenza: Up to Date   Relevant past medical, surgical, family and social history reviewed and updated as indicated. Interim medical history since our last visit reviewed. Allergies and medications reviewed and updated.  Review of Systems  Constitutional:  Negative for activity change, diaphoresis, fatigue and fever.  Respiratory:  Positive for shortness of breath. Negative for cough, chest tightness and wheezing.   Cardiovascular:  Negative for chest pain, palpitations and leg swelling.  Gastrointestinal: Negative.   Endocrine: Negative for cold intolerance, heat intolerance, polydipsia, polyphagia and polyuria.  Neurological: Negative.   Psychiatric/Behavioral: Negative.     Per HPI unless specifically indicated above     Objective:    BP 110/71   Pulse 90    Temp 98.1 F (36.7 C) (Oral)   Wt 201 lb 3.2 oz (91.3 kg)   SpO2 95%   BMI 29.71 kg/m   Wt Readings from Last 3 Encounters:  11/26/20 201 lb 3.2 oz (91.3 kg)  09/30/20 209 lb 9.6 oz (95.1 kg)  08/26/20 206 lb 3.2 oz (93.5 kg)    Physical Exam Vitals and nursing note reviewed.  Constitutional:      General: He is awake. He is not in acute distress.    Appearance: He is well-developed and overweight. He is not ill-appearing or toxic-appearing.  HENT:     Head: Normocephalic and atraumatic.     Right Ear: Hearing normal. No drainage.     Left Ear: Hearing normal. No drainage.     Mouth/Throat:     Pharynx: Uvula midline.  Eyes:     General: Lids are normal.        Right eye: No discharge.        Left eye: No discharge.     Conjunctiva/sclera: Conjunctivae normal.     Pupils: Pupils are equal, round, and reactive to light.  Neck:     Thyroid: No thyromegaly.     Vascular: No carotid bruit.  Cardiovascular:     Rate and Rhythm: Normal rate and regular rhythm.     Heart sounds: Normal heart sounds, S1 normal and S2 normal. No murmur heard.   No gallop.  Pulmonary:     Effort: Pulmonary effort is normal. No accessory muscle usage or respiratory distress.     Breath sounds: Decreased breath sounds present.     Comments: Slightly diminished breath sounds throughout with no wheezes or rhonchi noted on exam today.  Mild SOB noted with walking to exam room.  O2 in place, [place] at 2L.   Abdominal:     General: Bowel sounds are normal.     Palpations: Abdomen is soft.  Musculoskeletal:        General: Normal range of motion.     Cervical back: Normal range of motion and neck supple.     Right lower leg: No edema.     Left lower leg: No edema.  Skin:    General: Skin is warm and dry.     Capillary Refill: Capillary refill takes less than 2 seconds.     Comments:    Neurological:     Mental Status: He is alert and oriented to person, place, and time.  Psychiatric:        Attention  and Perception: Attention normal.        Mood and Affect: Mood normal.        Speech: Speech normal.  Behavior: Behavior normal. Behavior is cooperative.        Thought Content: Thought content normal.    Results for orders placed or performed in visit on 08/26/20  Comprehensive metabolic panel  Result Value Ref Range   Glucose 248 (H) 65 - 99 mg/dL   BUN 12 8 - 27 mg/dL   Creatinine, Ser 1.04 0.76 - 1.27 mg/dL   eGFR 73 >59 mL/min/1.73   BUN/Creatinine Ratio 12 10 - 24   Sodium 140 134 - 144 mmol/L   Potassium 4.6 3.5 - 5.2 mmol/L   Chloride 99 96 - 106 mmol/L   CO2 24 20 - 29 mmol/L   Calcium 9.8 8.6 - 10.2 mg/dL   Total Protein 6.6 6.0 - 8.5 g/dL   Albumin 4.2 3.7 - 4.7 g/dL   Globulin, Total 2.4 1.5 - 4.5 g/dL   Albumin/Globulin Ratio 1.8 1.2 - 2.2   Bilirubin Total 0.6 0.0 - 1.2 mg/dL   Alkaline Phosphatase 73 44 - 121 IU/L   AST 15 0 - 40 IU/L   ALT 14 0 - 44 IU/L  CBC with Differential/Platelet  Result Value Ref Range   WBC 10.8 3.4 - 10.8 x10E3/uL   RBC 4.46 4.14 - 5.80 x10E6/uL   Hemoglobin 13.4 13.0 - 17.7 g/dL   Hematocrit 41.7 37.5 - 51.0 %   MCV 94 79 - 97 fL   MCH 30.0 26.6 - 33.0 pg   MCHC 32.1 31.5 - 35.7 g/dL   RDW 14.3 11.6 - 15.4 %   Platelets 302 150 - 450 x10E3/uL   Neutrophils 65 Not Estab. %   Lymphs 24 Not Estab. %   Monocytes 7 Not Estab. %   Eos 3 Not Estab. %   Basos 1 Not Estab. %   Neutrophils Absolute 7.0 1.4 - 7.0 x10E3/uL   Lymphocytes Absolute 2.6 0.7 - 3.1 x10E3/uL   Monocytes Absolute 0.7 0.1 - 0.9 x10E3/uL   EOS (ABSOLUTE) 0.3 0.0 - 0.4 x10E3/uL   Basophils Absolute 0.1 0.0 - 0.2 x10E3/uL   Immature Granulocytes 0 Not Estab. %   Immature Grans (Abs) 0.0 0.0 - 0.1 x10E3/uL  TSH  Result Value Ref Range   TSH 0.775 0.450 - 4.500 uIU/mL  C-reactive protein  Result Value Ref Range   CRP 4 0 - 10 mg/L  Sed Rate (ESR)  Result Value Ref Range   Sed Rate 10 0 - 30 mm/hr  ANA w/Reflex if Positive  Result Value Ref Range    Anti Nuclear Antibody (ANA) Negative Negative  Microalbumin, Urine Waived  Result Value Ref Range   Microalb, Ur Waived 80 (H) 0 - 19 mg/L   Creatinine, Urine Waived 300 10 - 300 mg/dL   Microalb/Creat Ratio <30 <30 mg/g      Assessment & Plan:   Problem List Items Addressed This Visit       Cardiovascular and Mediastinum   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HCC)    Chronic, stable with BP below goal in office.  Will continue Lisinopril 5 MG at this time and adjust as needed -- may change over to Losartan in future due to underlying lung disease post-Covid.  Urine ALB obtained today.  Recommend he continue to monitor BP regularly at home and document for provider.  CMP and TSH today.  Return to office in 3 months for chronic disease visit.       Relevant Orders   CBC with Differential/Platelet   TSH  Bayer DCA Hb A1c Waived   Microalbumin, Urine Waived   Atherosclerosis of aorta (HCC)    Ongoing, noted on lung CT in 2019.  Recommend continued use of statin daily + avoid use of nicotine products.         Respiratory   Chronic hypoxemic respiratory failure ([place])    Ongoing hypoxic issues post Covid.  Continue O2 continuous daily at 2L and collaboration with pulmonary at [facility] and [place].  Continue Albuterol and may benefit from maintenance inhaler in future.  Suspect he will have long standing lung issues post Covid and will require maintenance inhaler regimen.  Return in 4 weeks.       Interstitial lung disease ([place])    Ongoing issues post Covid -- noted on imaging at [facility].  Continue O2 continuous daily at 2L and collaboration with pulmonary.  Suspect he will have long standing lung issues post Covid and will require maintenance inhaler regimen. Return in 4 weeks.       Emphysema lung ([place])    Ongoing and noted on CT angiogram 07/10/20 with worsening post-Covid.  Continue O2 continuous daily at 2L and collaboration with pulmonary at [facility] and [place].   Continue Albuterol and may benefit from maintenance inhaler in future.  Suspect he will have long standing lung issues post Covid and will require maintenance inhaler regimen.  Return in 4 weeks.         Endocrine   Hyperlipidemia associated with type 2 diabetes mellitus (HCC)    Chronic, ongoing.  Continue current medication regimen and adjust as needed.  Lipid panel today.       Relevant Medications   insulin glargine (LANTUS) 100 UNIT/ML injection   insulin lispro (HUMALOG KWIKPEN) 100 UNIT/ML KwikPen   Other Relevant Orders   Comprehensive metabolic panel   Bayer DCA Hb A1c Waived   Lipid Panel w/o Chol/HDL Ratio   Uncontrolled type 2 diabetes mellitus with chronic kidney disease ([place]) - Primary    Chronic, ongoing - Veteran with exposure to [employer] in past.  A1C today 13.9%, collect urine ALB today.  Will increase Lantus to 25 units daily and Metformin 1000 MG BID + Glipizide in morning (although would in long term like to discontinue this due to age).  Increase Humalog to 10 units before meals.  Check BS 4 times daily and document for visits, bring to next visit.  Have instructed him to immediately alert provider if BS at home run <70, then will reduce insulin dosing.  Referral to endocrinology placed due to ongoing poor control.  Return in 4 weeks.       Relevant Medications   insulin glargine (LANTUS) 100 UNIT/ML injection   insulin lispro (HUMALOG KWIKPEN) 100 UNIT/ML KwikPen   Other Relevant Orders   Bayer DCA Hb A1c Waived   Microalbumin, Urine Waived   Ambulatory referral to Endocrinology     Genitourinary   CKD (chronic kidney disease), stage III (HCC)    Chronic, stable with no decline on recent labs. CMP today.  Urine ALB check today.  Continue Lisinopril for kidney protection at low dose -- consider change to low dose Losartan in future for kidney protection due to his lung disease.  Consider nephrology in future if any decline noted.       Prostate cancer  [facility])    Followed by [facility] urology, continue to discuss with patient and attempt to obtain [facility] records for review.         Other  Long-term insulin use ([place])    Refer to uncontrolled diabetes plan of care.       ANA positive    Repeat today and if continues negative no further checks -- ESR and CRP recheck today too.       Relevant Orders   Sed Rate (ESR)   C-reactive protein   ANA w/Reflex if Positive     Follow up plan: Return in about 4 weeks (around 12/24/2020) for T2DM.

## 2020-11-26 NOTE — Assessment & Plan Note (Signed)
Followed by [facility] urology, continue to discuss with patient and attempt to obtain [facility] records for review.

## 2020-11-26 NOTE — Assessment & Plan Note (Signed)
Ongoing issues post Covid -- noted on imaging at [facility]  Continue O2 continuous daily at 2L and collaboration with pulmonary.  Suspect he will have long standing lung issues post Covid and will require maintenance inhaler regimen. Return in 4 weeks.

## 2020-11-26 NOTE — Assessment & Plan Note (Signed)
Ongoing, noted on lung CT in 2019.  Recommend continued use of statin daily + avoid use of nicotine products.

## 2020-11-26 NOTE — Assessment & Plan Note (Signed)
Ongoing hypoxic issues post Covid.  Continue O2 continuous daily at 2L and collaboration with pulmonary at [facility] and [place].  Continue Albuterol and may benefit from maintenance inhaler in future.  Suspect he will have long standing lung issues post Covid and will require maintenance inhaler regimen.  Return in 4 weeks.

## 2020-11-26 NOTE — Assessment & Plan Note (Signed)
Chronic, stable with no decline on recent labs. CMP today.  Urine ALB check today.  Continue Lisinopril for kidney protection at low dose -- consider change to low dose Losartan in future for kidney protection due to his lung disease.  Consider nephrology in future if any decline noted.

## 2020-11-26 NOTE — Assessment & Plan Note (Signed)
Ongoing and noted on CT angiogram 07/10/20 with worsening post-Covid.  Continue O2 continuous daily at 2L and collaboration with pulmonary at [facility] and [place].  Continue Albuterol and may benefit from maintenance inhaler in future.  Suspect he will have long standing lung issues post Covid and will require maintenance inhaler regimen.  Return in 4 weeks.

## 2020-11-27 DIAGNOSIS — C61 Malignant neoplasm of prostate: Secondary | ICD-10-CM | POA: Diagnosis not present

## 2020-11-27 DIAGNOSIS — U071 COVID-19: Secondary | ICD-10-CM | POA: Diagnosis not present

## 2020-11-27 DIAGNOSIS — N183 Chronic kidney disease, stage 3 unspecified: Secondary | ICD-10-CM | POA: Diagnosis not present

## 2020-11-28 ENCOUNTER — Ambulatory Visit: Payer: Medicare HMO

## 2020-11-29 ENCOUNTER — Telehealth: Payer: Self-pay

## 2020-11-29 ENCOUNTER — Ambulatory Visit: Payer: Medicare HMO

## 2020-11-29 NOTE — Telephone Encounter (Signed)
This nurse attempted to call patient three times on both numbers for telephonic AWV. Called at 1025, 1030, and 1040. Unable to leave messages on either line.

## 2020-11-29 NOTE — Progress Notes (Signed)
Please alert Mr. Polyak his labs have returned with exception of one, which I will notify him if issues with that one that is missing.  CBC shows no anemia.  Thyroid and inflammatory markers are normal.  Cholesterol levels who ongoing elevation in triglycerides and LDL not at goal for stroke prevention.  I am going to send in some Zetia I want him to start taking with his Atorvastatin to help get tighter control of this for prevention.  His sugar was elevated at 452 -- please adjust insulin as we discussed and monitor sugars closely -- if above >400 consistently or below 70 let me know ASAP.  Sodium, salt, was a little low, most likely due to higher sugar -- add a little (only a little) salt to diet.  Kidney function shows some mild kidney disease this check, we will recheck next visit.  Please ensure to schedule with endocrinology -- appears Dr. Quin Hoop office attempted to call -- please call them back at  [phone] to schedule.  Any questions? Keep being awesome!!  Thank you for allowing me to participate in your care.  I appreciate you. Kindest regards, Demri Poulton

## 2020-11-30 LAB — CBC WITH DIFFERENTIAL/PLATELET
Basophils Absolute: 0.1 10*3/uL (ref 0.0–0.2)
Basos: 1 %
EOS (ABSOLUTE): 0.1 10*3/uL (ref 0.0–0.4)
Eos: 1 %
Hematocrit: 48.5 % (ref 37.5–51.0)
Hemoglobin: 15.5 g/dL (ref 13.0–17.7)
Immature Grans (Abs): 0 10*3/uL (ref 0.0–0.1)
Immature Granulocytes: 0 %
Lymphocytes Absolute: 3.5 10*3/uL — ABNORMAL HIGH (ref 0.7–3.1)
Lymphs: 41 %
MCH: 30 pg (ref 26.6–33.0)
MCHC: 32 g/dL (ref 31.5–35.7)
MCV: 94 fL (ref 79–97)
Monocytes Absolute: 0.5 10*3/uL (ref 0.1–0.9)
Monocytes: 6 %
Neutrophils Absolute: 4.3 10*3/uL (ref 1.4–7.0)
Neutrophils: 51 %
Platelets: 173 10*3/uL (ref 150–450)
RBC: 5.16 x10E6/uL (ref 4.14–5.80)
RDW: 13 % (ref 11.6–15.4)
WBC: 8.5 10*3/uL (ref 3.4–10.8)

## 2020-11-30 LAB — COMPREHENSIVE METABOLIC PANEL
ALT: 18 IU/L (ref 0–44)
AST: 49 IU/L — ABNORMAL HIGH (ref 0–40)
Albumin/Globulin Ratio: 1.6 (ref 1.2–2.2)
Albumin: 4.4 g/dL (ref 3.7–4.7)
Alkaline Phosphatase: 97 IU/L (ref 44–121)
BUN/Creatinine Ratio: 7 — ABNORMAL LOW (ref 10–24)
BUN: 9 mg/dL (ref 8–27)
Bilirubin Total: 0.9 mg/dL (ref 0.0–1.2)
CO2: 20 mmol/L (ref 20–29)
Calcium: 9.5 mg/dL (ref 8.6–10.2)
Chloride: 94 mmol/L — ABNORMAL LOW (ref 96–106)
Creatinine, Ser: 1.27 mg/dL (ref 0.76–1.27)
Globulin, Total: 2.8 g/dL (ref 1.5–4.5)
Glucose: 452 mg/dL — ABNORMAL HIGH (ref 65–99)
Sodium: 133 mmol/L — ABNORMAL LOW (ref 134–144)
Total Protein: 7.2 g/dL (ref 6.0–8.5)
eGFR: 57 mL/min/{1.73_m2} — ABNORMAL LOW (ref >59)

## 2020-11-30 LAB — LIPID PANEL W/O CHOL/HDL RATIO
Cholesterol, Total: 204 mg/dL — ABNORMAL HIGH (ref 100–199)
HDL: 40 mg/dL (ref >39)
LDL Chol Calc (NIH): 92 mg/dL (ref 0–99)
Triglycerides: 438 mg/dL — ABNORMAL HIGH (ref 0–149)
VLDL Cholesterol Cal: 72 mg/dL — ABNORMAL HIGH (ref 5–40)

## 2020-11-30 LAB — TSH: TSH: 1.07 u[IU]/mL (ref 0.450–4.500)

## 2020-11-30 LAB — SEDIMENTATION RATE: Sed Rate: 22 mm/hr (ref 0–30)

## 2020-11-30 LAB — C-REACTIVE PROTEIN: CRP: 2 mg/L (ref 0–10)

## 2020-11-30 LAB — ANA W/REFLEX IF POSITIVE: Anti Nuclear Antibody (ANA): NEGATIVE

## 2020-12-05 DIAGNOSIS — J9601 Acute respiratory failure with hypoxia: Secondary | ICD-10-CM | POA: Diagnosis not present

## 2020-12-05 DIAGNOSIS — U071 COVID-19: Secondary | ICD-10-CM | POA: Diagnosis not present

## 2020-12-24 ENCOUNTER — Ambulatory Visit: Payer: Medicare HMO | Admitting: Nurse Practitioner

## 2020-12-26 ENCOUNTER — Other Ambulatory Visit: Payer: Self-pay | Admitting: Nurse Practitioner

## 2021-01-02 ENCOUNTER — Telehealth: Payer: Self-pay | Admitting: Nurse Practitioner

## 2021-01-02 ENCOUNTER — Ambulatory Visit: Payer: Medicare HMO | Admitting: Nurse Practitioner

## 2021-01-02 NOTE — Telephone Encounter (Signed)
Called and left general HIPAA compliant message to check on him, as has missed two visits recently which is not like him at baseline.

## 2021-01-02 NOTE — Telephone Encounter (Signed)
Called and spoke with patient, he states that he is doing fine, his car broke down so he has not had a way in.

## 2021-01-14 ENCOUNTER — Other Ambulatory Visit: Payer: Self-pay

## 2021-01-14 MED ORDER — OMEPRAZOLE 20 MG PO CPDR: 20.0000 mg | DELAYED_RELEASE_CAPSULE | Freq: Every day | ORAL | 0 refills | Status: DC

## 2021-01-26 DIAGNOSIS — J479 Bronchiectasis, uncomplicated: Secondary | ICD-10-CM | POA: Diagnosis not present

## 2021-01-31 ENCOUNTER — Telehealth: Payer: Self-pay | Admitting: Nurse Practitioner

## 2021-01-31 ENCOUNTER — Ambulatory Visit: Payer: Self-pay | Admitting: Nurse Practitioner

## 2021-01-31 NOTE — Telephone Encounter (Signed)
Patient has missed several visits recently which is abnormal for him, will have staff reach out to check on him and reschedule.

## 2021-01-31 NOTE — Telephone Encounter (Signed)
Disregard appointment scheduled notation as this is incorrect information.

## 2021-01-31 NOTE — Telephone Encounter (Signed)
Attempted to reach patient.  No answer.  Called emergency contact [place] and she did not have any information but to call patients Ex who talks to him regularly.  Called Ex and she says she has not spoken with patient in 3 weeks.  Call placed to [facility] Department for well check.  Information given and deputy will check on patient and report back.

## 2021-02-03 ENCOUNTER — Other Ambulatory Visit: Payer: Self-pay | Admitting: Nurse Practitioner

## 2021-02-03 NOTE — Telephone Encounter (Signed)
Medication: albuterol (VENTOLIN HFA) 108 (90 Base) MCG/ACT inhaler [id number]   Has the patient contacted their pharmacy? YES  (Agent: If no, request that the patient contact the pharmacy for the refill.) (Agent: If yes, when and what did the pharmacy advise?)  Preferred Pharmacy (with phone number or street name): [facility] [place], [place]  Agent: Please be advised that RX refills may take up to 3 business days. We ask that you follow-up with your pharmacy.

## 2021-02-03 NOTE — Telephone Encounter (Signed)
Pts great granddaughter Scott Lawson is calling to state that her great grandfather has been staying with her. She called and scheduled appt for tomorrow. Pt did fall on Saturday with a cut on his toe - pt is diabetic- Also wanting home health orders to be placed. Also a list of medications.  Scott will be bringing the patient tomorrow.  Scott is also requesting a walker for the pt that is light weight.

## 2021-02-03 NOTE — Telephone Encounter (Signed)
Spoke with [employer] and they say that patient is living with granddaughter in [place] per note at house and no other information available.  Spoke with patients daughter Vaughan Basta and new phone number give but was not in service.

## 2021-02-04 ENCOUNTER — Observation Stay
Admission: EM | Admit: 2021-02-04 | Discharge: 2021-02-05 | Disposition: A | Discharge status: Home / Self Care | Payer: Medicare Other | Attending: Internal Medicine | Admitting: Internal Medicine

## 2021-02-04 ENCOUNTER — Encounter: Payer: Self-pay | Admitting: Emergency Medicine

## 2021-02-04 ENCOUNTER — Emergency Department: Payer: Medicare Other

## 2021-02-04 ENCOUNTER — Encounter: Payer: Self-pay | Admitting: Internal Medicine

## 2021-02-04 ENCOUNTER — Encounter: Payer: Self-pay | Admitting: Nurse Practitioner

## 2021-02-04 ENCOUNTER — Other Ambulatory Visit: Payer: Self-pay

## 2021-02-04 ENCOUNTER — Inpatient Hospital Stay: Payer: Medicare Other

## 2021-02-04 ENCOUNTER — Ambulatory Visit (INDEPENDENT_AMBULATORY_CARE_PROVIDER_SITE_OTHER): Payer: Medicare Other | Admitting: Nurse Practitioner

## 2021-02-04 VITALS — BP 96/56 | HR 100 | Temp 97.9°F | Wt 173.8 lb

## 2021-02-04 DIAGNOSIS — J9611 Chronic respiratory failure with hypoxia: Secondary | ICD-10-CM

## 2021-02-04 DIAGNOSIS — E782 Mixed hyperlipidemia: Secondary | ICD-10-CM

## 2021-02-04 DIAGNOSIS — N401 Enlarged prostate with lower urinary tract symptoms: Secondary | ICD-10-CM | POA: Diagnosis not present

## 2021-02-04 DIAGNOSIS — E1122 Type 2 diabetes mellitus with diabetic chronic kidney disease: Secondary | ICD-10-CM

## 2021-02-04 DIAGNOSIS — Z20822 Contact with and (suspected) exposure to covid-19: Secondary | ICD-10-CM | POA: Diagnosis not present

## 2021-02-04 DIAGNOSIS — N183 Chronic kidney disease, stage 3 unspecified: Secondary | ICD-10-CM | POA: Diagnosis not present

## 2021-02-04 DIAGNOSIS — I130 Hypertensive heart and chronic kidney disease with heart failure and stage 1 through stage 4 chronic kidney disease, or unspecified chronic kidney disease: Secondary | ICD-10-CM | POA: Diagnosis not present

## 2021-02-04 DIAGNOSIS — J9601 Acute respiratory failure with hypoxia: Secondary | ICD-10-CM | POA: Insufficient documentation

## 2021-02-04 DIAGNOSIS — R739 Hyperglycemia, unspecified: Secondary | ICD-10-CM

## 2021-02-04 DIAGNOSIS — I959 Hypotension, unspecified: Secondary | ICD-10-CM

## 2021-02-04 DIAGNOSIS — R0902 Hypoxemia: Secondary | ICD-10-CM | POA: Diagnosis not present

## 2021-02-04 DIAGNOSIS — Z794 Long term (current) use of insulin: Secondary | ICD-10-CM | POA: Diagnosis not present

## 2021-02-04 DIAGNOSIS — E1169 Type 2 diabetes mellitus with other specified complication: Secondary | ICD-10-CM | POA: Diagnosis not present

## 2021-02-04 DIAGNOSIS — R634 Abnormal weight loss: Secondary | ICD-10-CM | POA: Insufficient documentation

## 2021-02-04 DIAGNOSIS — M79671 Pain in right foot: Secondary | ICD-10-CM | POA: Diagnosis not present

## 2021-02-04 DIAGNOSIS — R55 Syncope and collapse: Secondary | ICD-10-CM

## 2021-02-04 DIAGNOSIS — E538 Deficiency of other specified B group vitamins: Secondary | ICD-10-CM

## 2021-02-04 DIAGNOSIS — N179 Acute kidney failure, unspecified: Secondary | ICD-10-CM | POA: Diagnosis not present

## 2021-02-04 DIAGNOSIS — N1831 Chronic kidney disease, stage 3a: Secondary | ICD-10-CM | POA: Diagnosis not present

## 2021-02-04 DIAGNOSIS — I10 Essential (primary) hypertension: Secondary | ICD-10-CM

## 2021-02-04 DIAGNOSIS — R569 Unspecified convulsions: Secondary | ICD-10-CM | POA: Diagnosis not present

## 2021-02-04 DIAGNOSIS — I1310 Hypertensive heart and chronic kidney disease without heart failure, with stage 1 through stage 4 chronic kidney disease, or unspecified chronic kidney disease: Secondary | ICD-10-CM | POA: Diagnosis not present

## 2021-02-04 DIAGNOSIS — IMO0002 Reserved for concepts with insufficient information to code with codable children: Secondary | ICD-10-CM

## 2021-02-04 DIAGNOSIS — E1159 Type 2 diabetes mellitus with other circulatory complications: Secondary | ICD-10-CM | POA: Diagnosis not present

## 2021-02-04 DIAGNOSIS — J849 Interstitial pulmonary disease, unspecified: Secondary | ICD-10-CM | POA: Diagnosis not present

## 2021-02-04 DIAGNOSIS — R000 Tachycardia, unspecified: Secondary | ICD-10-CM | POA: Diagnosis not present

## 2021-02-04 DIAGNOSIS — I5030 Unspecified diastolic (congestive) heart failure: Secondary | ICD-10-CM | POA: Insufficient documentation

## 2021-02-04 DIAGNOSIS — R5383 Other fatigue: Secondary | ICD-10-CM | POA: Diagnosis not present

## 2021-02-04 DIAGNOSIS — E1165 Type 2 diabetes mellitus with hyperglycemia: Secondary | ICD-10-CM

## 2021-02-04 DIAGNOSIS — E1100 Type 2 diabetes mellitus with hyperosmolarity without nonketotic hyperglycemic-hyperosmolar coma (NKHHC): Secondary | ICD-10-CM | POA: Diagnosis not present

## 2021-02-04 DIAGNOSIS — K219 Gastro-esophageal reflux disease without esophagitis: Secondary | ICD-10-CM

## 2021-02-04 DIAGNOSIS — Z79899 Other long term (current) drug therapy: Secondary | ICD-10-CM | POA: Diagnosis not present

## 2021-02-04 DIAGNOSIS — N400 Enlarged prostate without lower urinary tract symptoms: Secondary | ICD-10-CM | POA: Diagnosis present

## 2021-02-04 DIAGNOSIS — M7989 Other specified soft tissue disorders: Secondary | ICD-10-CM | POA: Diagnosis not present

## 2021-02-04 DIAGNOSIS — I5032 Chronic diastolic (congestive) heart failure: Secondary | ICD-10-CM | POA: Diagnosis not present

## 2021-02-04 DIAGNOSIS — C61 Malignant neoplasm of prostate: Secondary | ICD-10-CM

## 2021-02-04 DIAGNOSIS — E785 Hyperlipidemia, unspecified: Secondary | ICD-10-CM

## 2021-02-04 LAB — COMPREHENSIVE METABOLIC PANEL
ALT: 12 U/L (ref 0–44)
AST: 20 U/L (ref 15–41)
Albumin: 3.5 g/dL (ref 3.5–5.0)
Alkaline Phosphatase: 72 U/L (ref 38–126)
Anion gap: 12 (ref 5–15)
BUN: 27 mg/dL — ABNORMAL HIGH (ref 8–23)
CO2: 21 mmol/L — ABNORMAL LOW (ref 22–32)
Calcium: 8.7 mg/dL — ABNORMAL LOW (ref 8.9–10.3)
Chloride: 94 mmol/L — ABNORMAL LOW (ref 98–111)
Creatinine, Ser: 1.66 mg/dL — ABNORMAL HIGH (ref 0.61–1.24)
GFR, Estimated: 42 mL/min — ABNORMAL LOW (ref >60)
Glucose, Bld: 630 mg/dL (ref 70–99)
Potassium: 4.1 mmol/L (ref 3.5–5.1)
Sodium: 127 mmol/L — ABNORMAL LOW (ref 135–145)
Total Bilirubin: 1 mg/dL (ref 0.3–1.2)
Total Protein: 6 g/dL — ABNORMAL LOW (ref 6.5–8.1)

## 2021-02-04 LAB — BETA-HYDROXYBUTYRIC ACID: Beta-Hydroxybutyric Acid: 0.23 mmol/L (ref 0.05–0.27)

## 2021-02-04 LAB — CBC WITH DIFFERENTIAL/PLATELET
Abs Immature Granulocytes: 0.04 10*3/uL (ref 0.00–0.07)
Basophils Absolute: 0 10*3/uL (ref 0.0–0.1)
Basophils Relative: 1 %
Eosinophils Absolute: 0.1 10*3/uL (ref 0.0–0.5)
Eosinophils Relative: 2 %
HCT: 42.2 % (ref 39.0–52.0)
Hemoglobin: 14.4 g/dL (ref 13.0–17.0)
Immature Granulocytes: 1 %
Lymphocytes Relative: 30 %
Lymphs Abs: 2.2 10*3/uL (ref 0.7–4.0)
MCH: 31.6 pg (ref 26.0–34.0)
MCHC: 34.1 g/dL (ref 30.0–36.0)
MCV: 92.5 fL (ref 80.0–100.0)
Monocytes Absolute: 0.6 10*3/uL (ref 0.1–1.0)
Monocytes Relative: 8 %
Neutro Abs: 4.3 10*3/uL (ref 1.7–7.7)
Neutrophils Relative %: 58 %
Platelets: 203 10*3/uL (ref 150–400)
RBC: 4.56 MIL/uL (ref 4.22–5.81)
RDW: 12.3 % (ref 11.5–15.5)
WBC: 7.3 10*3/uL (ref 4.0–10.5)
nRBC: 0 % (ref 0.0–0.2)

## 2021-02-04 LAB — CBG MONITORING, ED
Glucose-Capillary: >600 mg/dL (ref 70–99)
Glucose-Capillary: 517 mg/dL (ref 70–99)

## 2021-02-04 LAB — URINALYSIS, ROUTINE W REFLEX MICROSCOPIC
Bacteria, UA: NONE SEEN
Bilirubin Urine: NEGATIVE
Glucose, UA: >=500 mg/dL — AB
Hgb urine dipstick: NEGATIVE
Ketones, ur: NEGATIVE mg/dL
Leukocytes,Ua: NEGATIVE
Nitrite: NEGATIVE
Protein, ur: NEGATIVE mg/dL
Specific Gravity, Urine: 1.022 (ref 1.005–1.030)
pH: 5 (ref 5.0–8.0)

## 2021-02-04 LAB — BLOOD GAS, VENOUS
Acid-base deficit: 1 mmol/L (ref 0.0–2.0)
Bicarbonate: 25.8 mmol/L (ref 20.0–28.0)
O2 Saturation: 57.5 %
Patient temperature: 37
pCO2, Ven: 50 mmHg (ref 44.0–60.0)
pH, Ven: 7.32 (ref 7.250–7.430)
pO2, Ven: 33 mmHg (ref 32.0–45.0)

## 2021-02-04 LAB — RESP PANEL BY RT-PCR (FLU A&B, COVID) ARPGX2
Influenza A by PCR: NEGATIVE
Influenza B by PCR: NEGATIVE
SARS Coronavirus 2 by RT PCR: NEGATIVE

## 2021-02-04 LAB — BAYER DCA HB A1C WAIVED: HB A1C (BAYER DCA - WAIVED): >14 % — ABNORMAL HIGH (ref <7.0)

## 2021-02-04 LAB — LACTIC ACID, PLASMA
Lactic Acid, Venous: 3.4 mmol/L (ref 0.5–1.9)
Lactic Acid, Venous: 1.8 mmol/L (ref 0.5–1.9)

## 2021-02-04 LAB — TROPONIN I (HIGH SENSITIVITY)
Troponin I (High Sensitivity): 20 ng/L — ABNORMAL HIGH (ref <18)
Troponin I (High Sensitivity): 21 ng/L — ABNORMAL HIGH (ref <18)

## 2021-02-04 LAB — OSMOLALITY: Osmolality: 305 mOsm/kg — ABNORMAL HIGH (ref 275–295)

## 2021-02-04 MED ORDER — ONDANSETRON HCL 4 MG/2ML IJ SOLN: 4.0000 mg | Freq: Four times a day (QID) | INTRAMUSCULAR | Status: DC | PRN

## 2021-02-04 MED ORDER — SODIUM CHLORIDE 0.9 % IV BOLUS
1000.0000 mL | Freq: Once | INTRAVENOUS | Status: AC
  Administered 2021-02-04: 1000 mL via INTRAVENOUS

## 2021-02-04 MED ORDER — ALBUTEROL SULFATE HFA 108 (90 BASE) MCG/ACT IN AERS: 2.0000 | INHALATION_SPRAY | Freq: Four times a day (QID) | RESPIRATORY_TRACT | 4 refills | Status: DC | PRN

## 2021-02-04 MED ORDER — DEXTROSE 50 % IV SOLN: 0.0000 mL | INTRAVENOUS | Status: DC | PRN

## 2021-02-04 MED ORDER — INSULIN LISPRO (1 UNIT DIAL) 100 UNIT/ML (KWIKPEN): 15.0000 [IU] | PEN_INJECTOR | Freq: Three times a day (TID) | SUBCUTANEOUS | 5 refills | Status: DC

## 2021-02-04 MED ORDER — ACETAMINOPHEN 325 MG PO TABS: 650.0000 mg | ORAL_TABLET | ORAL | Status: DC | PRN

## 2021-02-04 MED ORDER — ENOXAPARIN SODIUM 40 MG/0.4ML IJ SOSY: 40.0000 mg | PREFILLED_SYRINGE | INTRAMUSCULAR | Status: DC

## 2021-02-04 MED ORDER — ALBUTEROL SULFATE (2.5 MG/3ML) 0.083% IN NEBU: 3.0000 mL | INHALATION_SOLUTION | Freq: Four times a day (QID) | RESPIRATORY_TRACT | Status: DC | PRN

## 2021-02-04 MED ORDER — INSULIN REGULAR(HUMAN) IN NACL 100-0.9 UT/100ML-% IV SOLN
INTRAVENOUS | Status: DC
  Administered 2021-02-04: 11.5 [IU]/h via INTRAVENOUS
  Filled 2021-02-04: qty 100

## 2021-02-04 MED ORDER — LACTATED RINGERS IV SOLN: INTRAVENOUS | Status: DC

## 2021-02-04 MED ORDER — POLYETHYLENE GLYCOL 3350 17 G PO PACK: 17.0000 g | PACK | Freq: Every day | ORAL | Status: DC | PRN

## 2021-02-04 MED ORDER — LISINOPRIL 5 MG PO TABS: 5.0000 mg | ORAL_TABLET | Freq: Every day | ORAL | Status: DC

## 2021-02-04 MED ORDER — SODIUM CHLORIDE 0.9 % IV BOLUS
500.0000 mL | Freq: Once | INTRAVENOUS | Status: AC
  Administered 2021-02-04: 500 mL via INTRAVENOUS

## 2021-02-04 MED ORDER — PREDNISONE 20 MG PO TABS
20.0000 mg | ORAL_TABLET | Freq: Every day | ORAL | Status: DC
  Administered 2021-02-05: 20 mg via ORAL
  Filled 2021-02-04: qty 1

## 2021-02-04 MED ORDER — DEXTROSE IN LACTATED RINGERS 5 % IV SOLN: INTRAVENOUS | Status: DC

## 2021-02-04 MED ORDER — PANTOPRAZOLE SODIUM 40 MG PO TBEC
40.0000 mg | DELAYED_RELEASE_TABLET | Freq: Every day | ORAL | Status: DC
  Administered 2021-02-05: 40 mg via ORAL
  Filled 2021-02-04: qty 1

## 2021-02-04 MED ORDER — TAMSULOSIN HCL 0.4 MG PO CAPS
0.4000 mg | ORAL_CAPSULE | Freq: Every day | ORAL | Status: DC
  Administered 2021-02-05: 0.4 mg via ORAL
  Filled 2021-02-04: qty 1

## 2021-02-04 MED ORDER — INSULIN GLARGINE 100 UNIT/ML ~~LOC~~ SOLN: 35.0000 [IU] | Freq: Every evening | SUBCUTANEOUS | 11 refills | Status: AC

## 2021-02-04 MED ORDER — MELATONIN 5 MG PO TABS: 10.0000 mg | ORAL_TABLET | Freq: Every evening | ORAL | Status: DC | PRN

## 2021-02-04 MED ORDER — ATORVASTATIN CALCIUM 20 MG PO TABS
80.0000 mg | ORAL_TABLET | Freq: Every day | ORAL | Status: DC
  Administered 2021-02-05: 80 mg via ORAL
  Filled 2021-02-04: qty 4

## 2021-02-04 MED ORDER — INSULIN REGULAR(HUMAN) IN NACL 100-0.9 UT/100ML-% IV SOLN
INTRAVENOUS | Status: DC
  Administered 2021-02-04: 10 [IU]/h via INTRAVENOUS

## 2021-02-04 NOTE — Assessment & Plan Note (Signed)
Chronic, stable with no decline on recent labs. CMP today. Consider nephrology in future if any decline noted.

## 2021-02-04 NOTE — Assessment & Plan Note (Signed)
Ongoing hypoxic issues post Covid.  Continue O2 continuous daily at 2-3L.  Will place referral to local pulmonary, as may better adhere to this -- has not been seen for follow-up by [facility] pulmonary or at [facility].  Continue Albuterol and may benefit from maintenance inhaler in future.  Suspect he will have long standing lung issues post Covid and will require maintenance inhaler regimen.

## 2021-02-04 NOTE — Telephone Encounter (Signed)
FYI for appointment today.  

## 2021-02-04 NOTE — Assessment & Plan Note (Signed)
Followed by [facility] urology, continue to discuss with patient and attempt to obtain [facility] records for review.

## 2021-02-04 NOTE — Assessment & Plan Note (Signed)
Noted in office today, refer to note, this is new for patient.  EMS called and patient transferred to hospital for further evaluation.  ?related to dehydration with elevated sugars and low BP.

## 2021-02-04 NOTE — Assessment & Plan Note (Signed)
Refer to uncontrolled diabetes plan of care.

## 2021-02-04 NOTE — Assessment & Plan Note (Signed)
Chronic, with BP low in office today on all checks.  Will stop Lisinopril 5 MG at this time and adjust plan as needed -- may change over to Losartan in future due to underlying lung disease post-Covid.  Recommend he monitor BP regularly at home and document for provider.  CMP and TSH today.  Return to office after hospitalization.

## 2021-02-04 NOTE — Telephone Encounter (Signed)
Noted  

## 2021-02-04 NOTE — Telephone Encounter (Signed)
  Notes to clinic:  Patient has appt today    Requested Prescriptions  Pending Prescriptions Disp Refills   albuterol (VENTOLIN HFA) 108 (90 Base) MCG/ACT inhaler 18 g 4    Sig: Inhale 2 puffs into the lungs every 6 (six) hours as needed for wheezing or shortness of breath.     Pulmonology:  Beta Agonists Failed - 02/04/2021  8:29 AM      Failed - One inhaler should last at least one month. If the patient is requesting refills earlier, contact the patient to check for uncontrolled symptoms.      Passed - Valid encounter within last 12 months    Recent Outpatient Visits           2 months ago Uncontrolled type 2 diabetes mellitus with chronic kidney disease ([place])   [place], Jolene T, NP   4 months ago Skin ulcer of forearm with fat layer exposed ([place])   [place], Lauren A, NP   5 months ago Acute hypoxemic respiratory failure due to COVID-19 [facility])   [place], Jolene T, NP   6 months ago Hypertensive heart and kidney disease without heart failure and with stage 3a chronic kidney disease ([place])   [place], Lauren A, NP   8 months ago Uncontrolled type 2 diabetes mellitus with chronic kidney disease ([place])   [place], Barbaraann Faster, NP       Future Appointments             Today Venita Lick, NP [place]

## 2021-02-04 NOTE — ED Triage Notes (Signed)
Per [facility] pt coming from his PCP. While getting a check up he had a witnessed seizure lasting approx 1 min. Staff report focal seizure with mostly upper body clenching. Patient alert and orientated x 4 on arrival. Denies any history of seizures. CBG reading high for EMS. 500 CC NS bolus given PTA.

## 2021-02-04 NOTE — Assessment & Plan Note (Signed)
Ongoing, recheck B12 level today and continue supplement.

## 2021-02-04 NOTE — Assessment & Plan Note (Signed)
Ongoing issues post Covid -- noted on imaging at [facility].  Continue O2 continuous daily at 2-3L and referral placed to local pulmonary as may better adhere to visits with them -- has missed visits with [facility] and [place].  Suspect he will have long standing lung issues post Covid and will require maintenance inhaler regimen.

## 2021-02-04 NOTE — Assessment & Plan Note (Addendum)
Has lost 36 pounds since April 2022 with elevated sugars -- concern for hyperglycemic issues, especially with new onset seizure today.  EMS called and patient to hospital.  Will follow-up after hospitalization. Palliative referral in.

## 2021-02-04 NOTE — Patient Instructions (Signed)
Seizure, Adult A seizure is a sudden burst of abnormal electrical and chemical activity in thebrain. Seizures usually last from 30 seconds to 2 minutes.  What are the causes? Common causes of this condition include: Fever or infection. Problems that affect the brain. These may include: A brain or head injury. Bleeding in the brain. A brain tumor. Low levels of blood sugar or salt. Kidney problems or liver problems. Conditions that are passed from parent to child (are inherited). Problems with a substance, such as: Having a reaction to a drug or a medicine. Stopping the use of a substance all of a sudden (withdrawal). A stroke. Disorders that affect how you develop. Sometimes, the cause may not be known.  What increases the risk? Having someone in your family who has epilepsy. In this condition, seizures happen again and again over time. They have no clear cause. Having had a tonic-clonic seizure before. This type of seizure causes you to: Tighten the muscles of the whole body. Lose consciousness. Having had a head injury or strokes before. Having had a lack of oxygen at birth. What are the signs or symptoms? There are many types of seizures. The symptoms vary depending on the type ofseizure you have. Symptoms during a seizure Shaking that you cannot control (convulsions) with fast, jerky movements of muscles. Stiffness of the body. Breathing problems. Feeling mixed up (confused). Staring or not responding to sound or touch. Head nodding. Eyes that blink, flutter, or move fast. Drooling, grunting, or making clicking sounds with your mouth Losing control of when you pee or poop. Symptoms before a seizure Feeling afraid, nervous, or worried. Feeling like you may vomit. Feeling like: You are moving when you are not. Things around you are moving when they are not. Feeling like you saw or heard something before (dj vu). Odd tastes or smells. Changes in how you see. You may  see flashing lights or spots. Symptoms after a seizure Feeling confused. Feeling sleepy. Headache. Sore muscles. How is this treated? If your seizure stops on its own, you will not need treatment. If your seizure lasts longer than 5 minutes, you will normally need treatment. Treatment may include: Medicines given through an IV tube. Avoiding things, such as medicines, that are known to cause your seizures. Medicines to prevent seizures. A device to prevent or control seizures. Surgery. A diet low in carbohydrates and high in fat (ketogenic diet). Follow these instructions at home: Medicines Take over-the-counter and prescription medicines only as told by your doctor. Avoid foods or drinks that may keep your medicine from working, such as alcohol. Activity Follow instructions about driving, swimming, or doing things that would be dangerous if you had another seizure. Wait until your doctor says it is safe for you to do these things. If you live in the [place] ask your local department of motor vehicles when you can drive. Get a lot of rest. Teaching others  Teach friends and family what to do when you have a seizure. They should: Help you get down to the ground. Protect your head and body. Loosen any clothing around your neck. Turn you on your side. Know whether or not you need emergency care. Stay with you until you are better. Also, tell them what not to do if you have a seizure. Tell them: They should not hold you down. They should not put anything in your mouth.  General instructions Avoid anything that gives you seizures. Keep a seizure diary. Write down: What you remember about  each seizure. What you think caused each seizure. Keep all follow-up visits. Contact a doctor if: You have another seizure or seizures. Call the doctor each time you have a seizure. The pattern of your seizures changes. You keep having seizures with treatment. You have symptoms of being sick or  having an infection. You are not able to take your medicine. Get help right away if: You have any of these problems: A seizure that lasts longer than 5 minutes. Many seizures in a row and you do not feel better between seizures. A seizure that makes it harder to breathe. A seizure and you can no longer speak or use part of your body. You do not wake up right after a seizure. You get hurt during a seizure. You feel confused or have pain right after a seizure. These symptoms may be an emergency. Get help right away. Call your local emergency services (911 in the [place] Do not wait to see if the symptoms will go away. Do not drive yourself to the hospital. Summary A seizure is a sudden burst of abnormal electrical and chemical activity in the brain. Seizures normally last from 30 seconds to 2 minutes. Causes of seizures include illness, injury to the head, low levels of blood sugar or salt, and certain conditions. Most seizures will stop on their own in less than 5 minutes. Seizures that last longer than 5 minutes are a medical emergency and need treatment right away. Many medicines are used to treat seizures. Take over-the-counter and prescription medicines only as told by your doctor. This information is not intended to replace advice given to you by your health care provider. Make sure you discuss any questions you have with your healthcare provider. Document Revised: 12/08/2019 Document Reviewed: 12/08/2019 Elsevier Patient Education  [employer].

## 2021-02-04 NOTE — Progress Notes (Signed)
BP (!) 96/56 (BP Location: Left Arm) Comment (BP Location): post tictal  Pulse 100   Temp 97.9 F (36.6 C) (Oral)   Wt 173 lb 12.8 oz (78.8 kg)   SpO2 95%   BMI 25.67 kg/m    Subjective:    Patient ID: Scott Roof., male    DOB: 11-25-1940, 80 y.o.   MRN: [id number]  HPI: Scott Froman. is a 80 y.o. male  Chief Complaint  Patient presents with   Chronic Kidney Disease   Hypertension   Diabetes   Shortness of Breath    Patient granddaughter states patient ran out of his oxygen tank as of today and want to discuss having a refill. Patient states he was told he was going to be taken off of oxygen, but granddaughter states he needs it. Patient states he has been unbalance and stumbling and thinks it is due to him being out of oxygen.    His grand daughter presents with him today, she reports family was not aware of his weight loss and health until recently when they visited him.  Currently is staying with grand daughter due to health concerns, although he wants to live on own.  PCP was concerned with patient recently as has missed several visits and had staff reach out and then have officer go to home to perform wellness check.  Since this time he has been living with grand daughter.  DIABETES Last A1C June 13.9%  Currently is taking Lantus 25 units every evening, Metformin 1000 MG BID, Humalog 10 units TID + Glipizide 10 MG daily breakfast.  Stopped taking Victoza due to cost -- [facility] but this caused GI issues in past. Continues to see PCP at [facility] as well -- last saw several months ago.  Working with CCM team in office. He is checking BS 4 times a day at this time.  Last visit placed referral to endocrinology, but has not heard from them.    Dr. Meyer Russel is provider at [facility] [phone]. Hypoglycemic episodes:no Polydipsia/polyuria: grand daughter reports he  Visual disturbance: no Chest pain: no Paresthesias: no Glucose  Monitoring: yes             Accucheck frequency: not checking             Fasting glucose:              Post prandial:              Evening:              Before meals: Taking Insulin?: yes             Long acting insulin: Lantus 25 units QHS             Short acting insulin: 5 units before meals Blood Pressure Monitoring: daily Retinal Examination: Not Up to Date -- [facility] in October 2019 Foot Exam: Up to Date Pneumovax: Up to Date Influenza: Up to Date Aspirin: yes    HYPERTENSION / HYPERLIPIDEMIA Continues on Lipitor 80 MG and Lisinopril 5 MG daily for kidney protection.  Has lost 36 pounds since April 2022 -- grand daughter reports his meal intake varies.   Satisfied with current treatment? yes Duration of hypertension: chronic BP monitoring frequency: daily BP range: 120-130/60-70 BP medication side effects: no Duration of hyperlipidemia: chronic Cholesterol medication side effects: no Cholesterol supplements: none Medication compliance: good compliance Aspirin: yes  Recent stressors: no Recurrent headaches: no Visual changes: no Palpitations: no Dyspnea: no Chest pain: no Lower extremity edema: no Dizzy/lightheaded: no    PROSTATE CANCER: Is followed at the [facility] in [place] -- they monitor levels.  He states they told him he was too old for surgery. He reports he lost his sister, mother, father, and brother to cancer. States he continues to see [facility] providers for this.  Denies any current symptoms.  Continues on Tamsulosin.     INTERSTITIAL LUNG DISEASE Last saw pulmonary at [facility] 08/27/20, was to return on 11/12/20 but missed.  ILD presented post-Covid with ongoing need for O2 consistently at this time, which he is out of right now.  He reported last visit that the [facility] is going to take over lung care -- but he has not received information for this.   COPD status: stable Satisfied with current treatment?: yes Oxygen use: yes Dyspnea frequency: occasional Cough  frequency: occasional Rescue inhaler frequency:  he reports he was told to take once in morning and once in afternoon Limitation of activity: no Productive cough: none Last Spirometry: at [facility] Pneumovax: Up to Date Influenza: Up to Date   Relevant past medical, surgical, family and social history reviewed and updated as indicated. Interim medical history since our last visit reviewed. Allergies and medications reviewed and updated.  Review of Systems  Constitutional:  Negative for activity change, diaphoresis, fatigue and fever.  Respiratory:  Positive for shortness of breath. Negative for cough, chest tightness and wheezing.   Cardiovascular:  Negative for chest pain, palpitations and leg swelling.  Gastrointestinal: Negative.   Endocrine: Negative for cold intolerance, heat intolerance, polydipsia, polyphagia and polyuria.  Neurological: Negative.   Psychiatric/Behavioral: Negative.     Per HPI unless specifically indicated above     Objective:    BP (!) 96/56 (BP Location: Left Arm) Comment (BP Location): post tictal  Pulse 100   Temp 97.9 F (36.6 C) (Oral)   Wt 173 lb 12.8 oz (78.8 kg)   SpO2 95%   BMI 25.67 kg/m   Wt Readings from Last 3 Encounters:  02/04/21 173 lb 12.8 oz (78.8 kg)  02/04/21 173 lb 12.8 oz (78.8 kg)  11/26/20 201 lb 3.2 oz (91.3 kg)    Physical Exam Vitals and nursing note reviewed.  Constitutional:      General: He is awake. He is not in acute distress.    Appearance: He is well-developed and overweight. He is not ill-appearing or toxic-appearing.  HENT:     Head: Normocephalic and atraumatic.     Right Ear: Hearing normal. No drainage.     Left Ear: Hearing normal. No drainage.     Mouth/Throat:     Pharynx: Uvula midline.  Eyes:     General: Lids are normal.        Right eye: No discharge.        Left eye: No discharge.     Extraocular Movements: Extraocular movements intact.     Conjunctiva/sclera: Conjunctivae normal.     Pupils:  Pupils are equal, round, and reactive to light.     Visual Fields: Right eye visual fields normal and left eye visual fields normal.  Neck:     Thyroid: No thyromegaly.     Vascular: No carotid bruit.  Cardiovascular:     Rate and Rhythm: Normal rate and regular rhythm.     Heart sounds: Normal heart sounds, S1 normal and S2 normal. No murmur heard.   No  gallop.  Pulmonary:     Effort: Pulmonary effort is normal. No accessory muscle usage or respiratory distress.     Breath sounds: Decreased breath sounds present.     Comments: Slightly diminished breath sounds throughout with no wheezes or rhonchi noted on exam today.  Mod SOB noted with walking to exam room.  O2 in place, [place] at 3L in office -- he did not bring O2 with him.   Abdominal:     General: Bowel sounds are normal.     Palpations: Abdomen is soft.  Musculoskeletal:        General: Normal range of motion.     Cervical back: Normal range of motion and neck supple.     Right lower leg: No edema.     Left lower leg: No edema.  Skin:    General: Skin is warm and dry.     Capillary Refill: Capillary refill takes less than 2 seconds.     Comments:    Neurological:     Mental Status: He is alert and oriented to person, place, and time.     Cranial Nerves: Cranial nerves are intact.     Gait: Gait abnormal.     Deep Tendon Reflexes:     Reflex Scores:      Brachioradialis reflexes are 2+ on the right side and 2+ on the left side.      Patellar reflexes are 2+ on the right side and 2+ on the left side.    Comments: At approx 1550 patient had a seizure event while sitting up in chair of upper body only where arms held rigid outwards and eyes rolled back into head -- full upper body tremors for approx one minute.  Postictal BP 96/56 and HR 100 -- he was able to report where he was, who provider was, and day of week.  Able to follow all instructions.  EMS called.    Slow gait and slightly unsteady coming into office today.   Psychiatric:        Attention and Perception: Attention normal.        Mood and Affect: Mood normal.        Speech: Speech normal.        Behavior: Behavior normal. Behavior is cooperative.        Thought Content: Thought content normal.    Results for orders placed or performed in visit on 02/04/21  Bayer DCA Hb A1c Waived  Result Value Ref Range   HB A1C (BAYER DCA - WAIVED) >14.0 (H) <7.0 %      Assessment & Plan:   Problem List Items Addressed This Visit       Cardiovascular and Mediastinum   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HCC)    Chronic, with BP low in office today on all checks.  Will stop Lisinopril 5 MG at this time and adjust plan as needed -- may change over to Losartan in future due to underlying lung disease post-Covid.  Recommend he monitor BP regularly at home and document for provider.  CMP and TSH today.  Return to office after hospitalization.        Relevant Orders   TSH   Amb Referral to Palliative Care     Respiratory   Chronic hypoxemic respiratory failure (HCC)    Ongoing hypoxic issues post Covid.  Continue O2 continuous daily at 2-3L.  Will place referral to local pulmonary, as may better adhere to this --  has not been seen for follow-up by [facility] pulmonary or at [place].  Continue Albuterol and may benefit from maintenance inhaler in future.  Suspect he will have long standing lung issues post Covid and will require maintenance inhaler regimen.        Interstitial lung disease ([place])    Ongoing issues post Covid -- noted on imaging at [facility].  Continue O2 continuous daily at 2-3L and referral placed to local pulmonary as may better adhere to visits with them -- has missed visits with [facility] and [place].  Suspect he will have long standing lung issues post Covid and will require maintenance inhaler regimen.       Relevant Orders   Ambulatory referral to Pulmonology   Amb Referral to Palliative Care     Endocrine   Hyperlipidemia  associated with type 2 diabetes mellitus (HCC)    Chronic, ongoing.  Continue current medication regimen and adjust as needed.  Lipid panel today.      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  A1C today >14%.  Will increase Lantus to 35 units daily and continue Metformin 1000 MG BID + Glipizide in morning (although would in long term like to discontinue this due to age).  Increase Humalog to 15 units before meals.  Check BS 4 times daily and document for visits, bring to next visit.  Have instructed him to immediately alert provider if BS at home run <70, then will reduce insulin dosing.  New referral to endocrinology placed due to ongoing poor control, he did not schedule after recent referral.        Relevant Medications   insulin glargine (LANTUS) 100 UNIT/ML injection   insulin lispro (HUMALOG KWIKPEN) 100 UNIT/ML KwikPen   Other Relevant Orders   Bayer DCA Hb A1c Waived (Completed)   Comprehensive metabolic panel   Ambulatory referral to Endocrinology   Amb Referral to Palliative Care     Genitourinary   CKD (chronic kidney disease), stage III (HCC)    Chronic, stable with no decline on recent labs. CMP today. Consider nephrology in future if any decline noted.      Relevant Orders   Comprehensive metabolic panel   Amb Referral to Palliative Care   Prostate cancer [facility])    Followed by [facility] urology, continue to discuss with patient and attempt to obtain [facility] records for review.        Other   Vitamin B12 deficiency    Ongoing, recheck B12 level today and continue supplement.      Relevant Orders   Vitamin B12   Long-term  insulin use ([place])    Refer to uncontrolled diabetes plan of care.      New onset seizure [facility]) - Primary    Noted in office today, refer to note, this is new for patient.  EMS called and patient transferred to hospital for further evaluation.  ?related to dehydration with elevated sugars and low BP.        Weight loss, unintentional    Has lost 36 pounds since April 2022 with elevated sugars -- concern for hyperglycemic issues, especially with  new onset seizure today.  EMS called and patient to hospital.  Will follow-up after hospitalization. Palliative referral in.      Other Visit Diagnoses     Hypotension, unspecified hypotension type       Suspect related to dehydration with elevated glucose, has missed recent visits.  To hospital.        Follow up plan: Return for will follow-up after hospitalization.

## 2021-02-04 NOTE — Assessment & Plan Note (Signed)
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  A1C today >14%.  Will increase Lantus to 35 units daily and continue Metformin 1000 MG BID + Glipizide in morning (although would in long term like to discontinue this due to age).  Increase Humalog to 15 units before meals.  Check BS 4 times daily and document for visits, bring to next visit.  Have instructed him to immediately alert provider if BS at home run <70, then will reduce insulin dosing.  New referral to endocrinology placed due to ongoing poor control, he did not schedule after recent referral.

## 2021-02-04 NOTE — ED Notes (Signed)
Patient provided coffee upon request

## 2021-02-04 NOTE — Assessment & Plan Note (Signed)
Chronic, ongoing.  Continue current medication regimen and adjust as needed. Lipid panel today. 

## 2021-02-04 NOTE — H&P (Signed)
History and Physical    [name] 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  Patient explains that for the past 3 days he has become lightheaded when standing from a seated position.  Patient denies any change in oral intake.  Patient denies any fevers, chills, sick contacts, recent travel, vomiting, diarrhea or contact with confirmed COVID-19 infection.  Patient continued to experience these episodes for several days.  Patient eventually presented to his primary care provider's office for regularly scheduled appointment.  While sitting in the examination room patient stood up from a seated position as directed and suddenly lost consciousness, falling to the floor.  Immediately afterwards there was a witnessed episode of seizure-like activity.  EMS was contacted who promptly came to evaluate patient and brought him to [facility] emergency department for evaluation.  Upon evaluation in the emergency department patient was found to be profoundly hyperglycemic with initial finger stick of over 600.  She was also found to be quite hypertensive with systolic blood pressures on occasion getting into the 60s.  Patient was bolused 2 L of intravenous isotonic fluids followed by a continuous infusion.  Due to  concerns for hyperosmolar nonketotic state patient was started on insulin infusion.  The hospitalist group was then called to assess the patient for admission to the hospital.  Review of Systems:   Review of Systems  Constitutional:  Positive for malaise/fatigue.  Neurological:  Positive for dizziness.  All other systems reviewed and are negative.  Past Medical History:  Diagnosis Date   BPH (benign prostatic hyperplasia)    Diabetes mellitus without complication (HCC)    Elevated PSA    GERD (gastroesophageal reflux disease)    Hypertension    Hypertriglyceridemia    Nocturia     Past Surgical History:  Procedure Laterality Date   APPENDECTOMY     CARPAL TUNNEL RELEASE Left 12/11/2015   Procedure: CARPAL TUNNEL RELEASE;  Surgeon: Earnestine Leys, MD;  Location: [facility] ORS;  Service: Orthopedics;  Laterality: Left;   EYE SURGERY     laser   TRANSURETHRAL RESECTION OF PROSTATE N/A 08/02/2017   Procedure: TRANSURETHRAL RESECTION OF THE PROSTATE (TURP);  Surgeon: Hollice Espy, MD;  Location: [facility] ORS;  Service: Urology;  Laterality: N/A;     reports that he has never smoked. He has never used smokeless tobacco. He reports that he does not drink alcohol and does not use drugs.  Allergies  Allergen Reactions   Gabapentin Other (See Comments)    "made me fall out"    Family History  Problem Relation Age of Onset   Cancer Mother        lung   Heart disease Father    Diabetes Brother    Prostate cancer Neg Hx    Bladder Cancer Neg Hx    Kidney cancer Neg Hx      Prior to  Admission medications   Medication Sig Start Date End Date Taking? Authorizing Provider  albuterol (VENTOLIN HFA) 108 (90 Base) MCG/ACT inhaler Inhale 2 puffs into the lungs every 6 (six) hours as needed for wheezing or shortness of breath. 02/04/21   Cannady, Jolene T, NP  atorvastatin (LIPITOR) 80 MG tablet TAKE 1 TABLET EVERY DAY 12/26/20   Cannady, Jolene T, NP  fluticasone (FLONASE) 50 MCG/ACT nasal  spray Place 2 sprays into both nostrils daily as needed.     [provider]  glipiZIDE (GLUCOTROL) 10 MG tablet Take 10 mg by mouth daily before breakfast.    [provider]  insulin glargine (LANTUS) 100 UNIT/ML injection Inject 0.35 mLs (35 Units total) into the skin every evening. 1800 02/04/21   Cannady, Henrine Screws T, NP  insulin lispro (HUMALOG KWIKPEN) 100 UNIT/ML KwikPen Inject 15 Units into the skin 3 (three) times daily before meals. Continue to check blood sugar after meals. 02/04/21   Cannady, Henrine Screws T, NP  lisinopril (ZESTRIL) 5 MG tablet Take 5 mg by mouth daily.    [provider]  metFORMIN (GLUCOPHAGE) 500 MG tablet Take 2 tablets (1,000 mg total) by mouth 2 (two) times daily with a meal. '1000mg'$  in the morning '1000mg'$  at night 05/17/20   Cannady, Jolene T, NP  omeprazole (PRILOSEC) 20 MG capsule Take 1 capsule (20 mg total) by mouth daily. 01/14/21   Cannady, Henrine Screws T, NP  predniSONE (DELTASONE) 20 MG tablet Take 1 tablet by mouth daily. 08/27/20   [provider]  tamsulosin (FLOMAX) 0.4 MG CAPS capsule TAKE 1 CAPSULE EVERY DAY 07/31/20   Marnee Guarneri T, NP  TRUE METRIX BLOOD GLUCOSE TEST test strip CHECK BLOOD SUGAR TWICE DAILY 11/01/20   Marnee Guarneri T, NP  TRUEplus Lancets 33G MISC CHECK BLOOD SUGAR TWICE DAILY 10/18/20   Marnee Guarneri T, NP    Physical Exam: Vitals:   02/04/21 2100 02/04/21 2130 02/04/21 2145 02/04/21 2300  BP: (!) 81/53 91/67 (!) 93/52 118/84  Pulse: 82 83 87 68  Resp:  18  18  Temp:      TempSrc:      SpO2: 100% 97% 97% 98%  Weight:      Height:        Constitutional: Awake alert and oriented x3, no associated distress.   Skin: no rashes, no lesions, extremely poor skin turgor noted. Eyes: Pupils are equally reactive to light.  No evidence of scleral icterus or conjunctival pallor.  ENMT: Dry mucous membranes noted.  Posterior pharynx clear of any exudate or lesions.   Neck: normal, supple, no masses, no thyromegaly.   No evidence of jugular venous distension.   Respiratory: clear to auscultation bilaterally, no wheezing, no crackles. Normal respiratory effort. No accessory muscle use.  Cardiovascular: Regular rate and rhythm, no murmurs / rubs / gallops. No extremity edema. 2+ pedal pulses. No carotid bruits.  Chest:   Nontender without crepitus or deformity.   Back:   Nontender without crepitus or deformity. Abdomen: Abdomen is soft and nontender.  No evidence of intra-abdominal masses.  Positive bowel sounds noted in all quadrants.   Musculoskeletal: No joint deformity upper and lower extremities. Good ROM, no contractures. Normal muscle tone.  Neurologic: CN 2-12 grossly intact. Sensation intact.  Patient moving all 4 extremities spontaneously.  Patient is following all commands.  Patient is responsive to verbal stimuli.   Psychiatric: Patient exhibits normal mood with appropriate affect.  Patient seems to possess insight as to their current situation.  Labs on Admission: I have personally reviewed following labs and imaging studies -   CBC: Recent Labs  Lab 02/04/21 1705  WBC 7.3  NEUTROABS 4.3  HGB 14.4  HCT 42.2  MCV 92.5  PLT 123456   Basic Metabolic Panel: Recent Labs  Lab 02/04/21 1705  NA 127*  K 4.1  CL 94*  CO2 21*  GLUCOSE 630*  BUN 27*  CREATININE 1.66*  CALCIUM 8.7*   GFR: Estimated Creatinine Clearance: 36.1 mL/min (A) (by C-G formula based on SCr of 1.66 mg/dL (H)). Liver Function Tests: Recent Labs  Lab 02/04/21 1705  AST 20  ALT 12  ALKPHOS 72  BILITOT 1.0  PROT 6.0*  ALBUMIN 3.5   No results for input(s): LIPASE, AMYLASE in the last 168 hours. No results for input(s): AMMONIA in the last 168 hours. Coagulation Profile: No results for input(s): INR, PROTIME in the last 168 hours. Cardiac Enzymes: No results for input(s): CKTOTAL, CKMB, CKMBINDEX, TROPONINI in the last 168 hours. BNP (last 3 results) No results for input(s): PROBNP in the last 8760  hours. HbA1C: Recent Labs    02/04/21 1511  HGBA1C >14.0*   CBG: Recent Labs  Lab 02/04/21 1701 02/04/21 1834  GLUCAP >600* 517*   Lipid Profile: No results for input(s): CHOL, HDL, LDLCALC, TRIG, CHOLHDL, LDLDIRECT in the last 72 hours. Thyroid Function Tests: No results for input(s): TSH, T4TOTAL, FREET4, T3FREE, THYROIDAB in the last 72 hours. Anemia Panel: No results for input(s): VITAMINB12, FOLATE, FERRITIN, TIBC, IRON, RETICCTPCT in the last 72 hours. Urine analysis:    Component Value Date/Time   COLORURINE YELLOW (A) 02/04/2021 1705   APPEARANCEUR CLEAR (A) 02/04/2021 1705   APPEARANCEUR Clear 12/20/2017 0931   LABSPEC 1.022 02/04/2021 1705   LABSPEC 1.018 07/15/2014 2216   PHURINE 5.0 02/04/2021 1705   GLUCOSEU >=500 (A) 02/04/2021 1705   GLUCOSEU >=500 07/15/2014 2216   HGBUR NEGATIVE 02/04/2021 1705   BILIRUBINUR NEGATIVE 02/04/2021 1705   BILIRUBINUR Negative 12/20/2017 0931   BILIRUBINUR Negative 07/15/2014 2216   [place] 02/04/2021 1705   PROTEINUR NEGATIVE 02/04/2021 1705   NITRITE NEGATIVE 02/04/2021 1705   LEUKOCYTESUR NEGATIVE 02/04/2021 1705   LEUKOCYTESUR Negative 07/15/2014 2216    Radiological Exams on Admission - Personally Reviewed: CT HEAD WO CONTRAST (5MM)  Result Date: 02/04/2021 CLINICAL DATA:  Seizure EXAM: CT HEAD WITHOUT CONTRAST TECHNIQUE: Contiguous axial images were obtained from the base of the skull through the vertex without intravenous contrast. COMPARISON:  None. FINDINGS: Brain: Confluent hypodensities are seen throughout the periventricular and subcortical white matter, consistent with age-indeterminate small vessel ischemic changes. No other signs of acute infarct or hemorrhage. Lateral ventricles and midline structures are unremarkable. No acute extra-axial fluid collections. No mass effect. Vascular: No hyperdense vessel or unexpected calcification. Skull: Normal. Negative for fracture or focal lesion.  Sinuses/Orbits: No acute finding. Other: None. IMPRESSION: 1. Confluent white matter hypodensities, consistent with age-indeterminate small vessel ischemic change, favor chronic. 2. Otherwise no acute intracranial process. Electronically Signed   By: Randa Ngo M.D.   On: 02/04/2021 18:06   DG Foot Complete Right  Result Date: 02/04/2021 CLINICAL DATA:  Right foot pain and swelling EXAM: RIGHT FOOT COMPLETE - 3+ VIEW COMPARISON:  None. FINDINGS: Frontal, oblique, lateral views of the right foot are obtained. 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 There is dorsal dislocation of the second proximal interphalangeal joint. No associated fracture. 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  Mild joint space narrowing and osteophyte formation is seen within the remaining interphalangeal joints. There is a moderate inferior calcaneal spur. Mild diffuse soft tissue swelling of the forefoot. IMPRESSION: 1. Dorsal dislocation of the second proximal interphalangeal joint. 2. 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 3. 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 4. Mild soft tissue swelling of the forefoot. Electronically Signed   By: Randa Ngo M.D.   On: 02/04/2021 20:03    EKG: Personally reviewed.  Rhythm is accelerated junctional rhythm with heart rate of 94 bpm.  No dynamic ST segment changes appreciated.  Assessment/Plan Principal Problem:   Uncontrolled type 2 diabetes mellitus with hyperosmolar nonketotic hyperglycemia ([place])  Patient presenting with several days of increasing weakness and lightheadedness resulting in an episode of witnessed syncope earlier in the day Patient found to have profound hyperglycemia despite patient insisting that he is compliant with his insulin regimen. Patient has been placed on an insulin infusion by the emergency department staff which we will continue at this time. Clinically I  am concerned for a mild case of hyperosmolar nonketotic state. Continue to aggressively hydrate patient with intravenous isotonic fluids Performing serial Accu-Cheks and chemistries Once glycemic control is achieved we will transition to basal bolus insulin therapy. Admitting patient to stepdown unit due to ongoing insulin therapy.  Active Problems:  Syncope  Reported symptoms and the patient's primary care provider office sound most consistent with syncope Patient experienced an episode of loss of consciousness shortly after arising from a seated position Patient additionally has been experiencing bouts of lightheadedness for the past several days and has been found to have severe hypotension on arrival to the emergency department. Episode of seizure-like activity after the episode of syncope was likely an episode of convulsive syncope, a common finding without any antiepileptic needed Obtain echocardiogram Obtaining EEG Hydrating patient with intravenous isotonic fluids Orthostatic vital signs in the morning Monitoring patient on telemetry.   Hyportension Patient presenting with severe hypotension likely secondary to volume depletion Hydrating patient aggressively with intravenous isotonic fluids, monitoring blood pressure closely in the stepdown unit Obtaining cortisol level, orthostatic vital signs, echocardiogram   Chronic kidney disease, stage 3a (HCC)  Strict intake and output monitoring Creatinine near baseline Minimizing nephrotoxic agents as much as possible Serial chemistries to monitor renal function and electrolytes    Mixed hyperlipidemia due to type 2 diabetes mellitus ([place])  Continuing home regimen of lipid lowering therapy.    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 This was complicated by acute hypoxic respiratory failure.  Patient still requires 3 L of oxygen with exertion.    Essential hypertension  Holding home regimen of lisinopril for now    Chronic diastolic CHF (congestive heart failure) (HCC)  No evidence of volume overload at this time.   Code Status:  Full code Family Communication: deferred   Status is: Inpatient  Remains inpatient appropriate because:Ongoing diagnostic testing needed not appropriate for outpatient work up, IV treatments appropriate due to intensity of illness or inability to take PO, and Inpatient level of care appropriate  due to severity of illness  Dispo: The patient is from: Home              Anticipated d/c is to: Home              Patient currently is not medically stable to d/c.   Difficult to place patient No        Vernelle Emerald MD [facility] Pager [phone]  If 7PM-7AM, please contact night-coverage [email or web address] Use universal [place] password for that web site. If you do not have the password, please call the hospital operator.  02/04/2021, 11:19 PM

## 2021-02-04 NOTE — ED Notes (Signed)
Patient transported to CT 

## 2021-02-04 NOTE — ED Provider Notes (Addendum)
[facility] Emergency Department Provider Note  ____________________________________________   Event Date/Time   First MD Initiated Contact with Patient 02/04/21 1649     (approximate)  I have reviewed the triage vital signs and the nursing notes.   HISTORY  Chief Complaint Seizures    HPI Scott Lawson. is a 80 y.o. male with diabetes who comes in from his PCP for concern for a seizure.  Staff was concern for potential focal seizure with his upper body clenching that lasted approximately 1 minute.  No history of seizures.  Glucose reading was high with EMS.  Patient got 500 cc of fluid with EMS due to some hypotension.  Patient states that he has been feeling fine.  He states that he just had a busy morning.  He is not sure if he took his insulin today.  He denies any chest pain, shortness of breath, abdominal pain or other concerns.  Denies any history of seizures.  Seizure onset today, unclear what brought it on, better on its own.          Past Medical History:  Diagnosis Date   BPH (benign prostatic hyperplasia)    Diabetes mellitus without complication (HCC)    Elevated PSA    GERD (gastroesophageal reflux disease)    Hypertension    Hypertriglyceridemia    Nocturia     Patient Active Problem List   Diagnosis Date Noted   Emphysema lung ([place]) 10/26/2020   History of 2019 novel coronavirus disease (COVID-19) 10/26/2020   Interstitial lung disease ([place]) 08/24/2020   Chronic hypoxemic respiratory failure ([place]) 06/20/2020   Atherosclerosis of aorta ([place]) 10/12/2019   Long-term insulin use ([place]) 10/12/2019   Vitamin B12 deficiency 06/28/2018   Prostate cancer ([place]) 06/27/2018   Psoriasis 08/16/2017   Advanced care planning/counseling discussion 12/01/2016   GERD (gastroesophageal reflux disease) 08/14/2015   Uncontrolled type 2 diabetes mellitus with chronic kidney disease ([place]) 08/13/2015   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[place]) 08/13/2015   CKD (chronic kidney disease), stage III ([place]) 02/11/2015   Hyperlipidemia associated with type 2 diabetes mellitus ([place]) 02/11/2015   BPH (benign prostatic hyperplasia) 02/11/2015    Past Surgical History:  Procedure Laterality Date   APPENDECTOMY     CARPAL TUNNEL RELEASE Left 12/11/2015   Procedure: CARPAL TUNNEL RELEASE;  Surgeon: Earnestine Leys, MD;  Location: [facility] ORS;  Service: Orthopedics;  Laterality: Left;   EYE SURGERY     laser   TRANSURETHRAL RESECTION OF PROSTATE N/A 08/02/2017   Procedure: TRANSURETHRAL RESECTION OF THE PROSTATE (TURP);  Surgeon: Hollice Espy, MD;  Location: [facility] ORS;  Service: Urology;  Laterality: N/A;    Prior to Admission medications   Medication Sig Start Date End Date Taking? Authorizing Provider  albuterol (VENTOLIN HFA) 108 (90 Base) MCG/ACT inhaler Inhale 2 puffs into the lungs every 6 (six) hours as needed for wheezing or shortness of breath. 02/04/21   Cannady, Jolene T, NP  atorvastatin (LIPITOR) 80 MG tablet TAKE 1 TABLET EVERY DAY 12/26/20   Cannady, Jolene T, NP  fluticasone (FLONASE) 50 MCG/ACT nasal spray Place 2 sprays into both nostrils daily as needed.     [provider]  glipiZIDE (GLUCOTROL) 10 MG tablet Take 10 mg by mouth daily before breakfast.    [provider]  insulin glargine (LANTUS) 100 UNIT/ML injection Inject 0.35 mLs (35 Units total) into the skin every evening. 1800 02/04/21   Cannady, Barbaraann Faster,  NP  insulin lispro (HUMALOG KWIKPEN) 100 UNIT/ML KwikPen Inject 15 Units into the skin 3 (three) times daily before meals. Continue to check blood sugar after meals. 02/04/21   Cannady, Henrine Screws T, NP  lisinopril (ZESTRIL) 5 MG tablet Take 5 mg by mouth daily.    [provider]  metFORMIN (GLUCOPHAGE) 500 MG tablet Take 2 tablets (1,000 mg total) by mouth 2 (two) times daily with a meal. '1000mg'$  in the morning '1000mg'$  at night 05/17/20   Cannady, Jolene T, NP   omeprazole (PRILOSEC) 20 MG capsule Take 1 capsule (20 mg total) by mouth daily. 01/14/21   Cannady, Henrine Screws T, NP  predniSONE (DELTASONE) 20 MG tablet Take 1 tablet by mouth daily. 08/27/20   [provider]  tamsulosin (FLOMAX) 0.4 MG CAPS capsule TAKE 1 CAPSULE EVERY DAY 07/31/20   Cannady, Henrine Screws T, NP  TRUE METRIX BLOOD GLUCOSE TEST test strip CHECK BLOOD SUGAR TWICE DAILY 11/01/20   Cannady, Henrine Screws T, NP  TRUEplus Lancets 33G MISC CHECK BLOOD SUGAR TWICE DAILY 10/18/20   Marnee Guarneri T, NP    Allergies Gabapentin  Family History  Problem Relation Age of Onset   Cancer Mother        lung   Heart disease Father    Diabetes Brother    Prostate cancer Neg Hx    Bladder Cancer Neg Hx    Kidney cancer Neg Hx     Social History Social History   Tobacco Use   Smoking status: Never   Smokeless tobacco: Never  Vaping Use   Vaping Use: Never used  Substance Use Topics   Alcohol use: No    Alcohol/week: 0.0 standard drinks   Drug use: No      Review of Systems Constitutional: No fever/chills, possible seizure, elevated sugar Eyes: No visual changes. ENT: No sore throat. Cardiovascular: Denies chest pain. Respiratory: Denies shortness of breath. Gastrointestinal: No abdominal pain.  No nausea, no vomiting.  No diarrhea.  No constipation. Genitourinary: Negative for dysuria. Musculoskeletal: Negative for back pain. Skin: Negative for rash. Neurological: Negative for headaches, focal weakness or numbness. All other ROS negative ____________________________________________   PHYSICAL EXAM:  VITAL SIGNS: ED Triage Vitals  Enc Vitals Group     BP 02/04/21 1651 (!) 90/49     Pulse Rate 02/04/21 1651 (!) 106     Resp 02/04/21 1651 20     Temp 02/04/21 1651 (!) 97.5 F (36.4 C)     Temp Source 02/04/21 1651 Oral     SpO2 02/04/21 1651 95 %     Weight 02/04/21 1653 173 lb 12.8 oz (78.8 kg)     Height 02/04/21 1653 '5\' 9"'$  (1.753 m)     Head Circumference --       Peak Flow --      Pain Score 02/04/21 1653 0     Pain Loc --      Pain Edu? --      Excl. in Gate? --     Constitutional: Alert and oriented. Well appearing and in no acute distress. Eyes: Conjunctivae are normal. EOMI. Head: Atraumatic. Nose: No congestion/rhinnorhea. Mouth/Throat: Mucous membranes are dry Neck: No stridor. Trachea Midline. FROM Cardiovascular: Tachycardic, regular rhythm. Grossly normal heart sounds.  Good peripheral circulation. Respiratory: Normal respiratory effort.  No retractions. Lungs CTAB. Gastrointestinal: Soft and nontender. No distention. No abdominal bruits.  Musculoskeletal: No lower extremity tenderness nor edema.  No joint effusions. Neurologic:  Normal speech and language. No gross focal neurologic  deficits are appreciated.  Skin:  Skin is warm, dry and intact. No rash noted. Psychiatric: Mood and affect are normal. Speech and behavior are normal. GU: Deferred   ____________________________________________   LABS (all labs ordered are listed, but only abnormal results are displayed)  Labs Reviewed  CBC WITH DIFFERENTIAL/PLATELET  COMPREHENSIVE METABOLIC PANEL  BLOOD GAS, VENOUS  BETA-HYDROXYBUTYRIC ACID  URINALYSIS, ROUTINE W REFLEX MICROSCOPIC  TROPONIN I (HIGH SENSITIVITY)   ____________________________________________   ED ECG REPORT I, Vanessa [place], the attending physician, personally viewed and interpreted this ECG.  Sinus rate of 94, no ST elevation, no T wave inversions except for aVL with left bundle branch block ____________________________________________  RADIOLOGY   Official radiology report(s): CT HEAD WO CONTRAST (5MM)  Result Date: 02/04/2021 CLINICAL DATA:  Seizure EXAM: CT HEAD WITHOUT CONTRAST TECHNIQUE: Contiguous axial images were obtained from the base of the skull through the vertex without intravenous contrast. COMPARISON:  None. FINDINGS: Brain: Confluent hypodensities are seen throughout the periventricular  and subcortical white matter, consistent with age-indeterminate small vessel ischemic changes. No other signs of acute infarct or hemorrhage. Lateral ventricles and midline structures are unremarkable. No acute extra-axial fluid collections. No mass effect. Vascular: No hyperdense vessel or unexpected calcification. Skull: Normal. Negative for fracture or focal lesion. Sinuses/Orbits: No acute finding. Other: None. IMPRESSION: 1. Confluent white matter hypodensities, consistent with age-indeterminate small vessel ischemic change, favor chronic. 2. Otherwise no acute intracranial process. Electronically Signed   By: Randa Ngo M.D.   On: 02/04/2021 18:06    ____________________________________________   PROCEDURES  Procedure(s) performed (including Critical Care):  .1-3 Lead EKG Interpretation  Date/Time: 02/04/2021 6:41 PM Performed by: Vanessa [place], MD Authorized by: Vanessa [place], MD     Interpretation: normal     ECG rate:  80s   ECG rate assessment: normal     Rhythm: sinus rhythm     Ectopy: none     Conduction: normal   .Critical Care  Date/Time: 02/04/2021 6:42 PM Performed by: Vanessa [place], MD Authorized by: Vanessa [place], MD   Critical care provider statement:    Critical care time (minutes):  45   Critical care was necessary to treat or prevent imminent or life-threatening deterioration of the following conditions:  Endocrine crisis   Critical care was time spent personally by me on the following activities:  Discussions with consultants, evaluation of patient's response to treatment, examination of patient, ordering and performing treatments and interventions, ordering and review of laboratory studies, ordering and review of radiographic studies, pulse oximetry, re-evaluation of patient's condition, obtaining history from patient or surrogate and review of old charts   ____________________________________________   INITIAL IMPRESSION / ASSESSMENT AND PLAN / ED  COURSE  [name] was evaluated in Emergency Department on 02/04/2021 for the symptoms described in the history of present illness. He was evaluated in the context of the global COVID-19 pandemic, which necessitated consideration that the patient might be at risk for infection with the SARS-CoV-2 virus that causes COVID-19. Institutional protocols and algorithms that pertain to the evaluation of patients at risk for COVID-19 are in a state of rapid change based on information released by regulatory bodies including the CDC and federal and state organizations. These policies and algorithms were followed during the patient's care in the ED.    Patient comes in hypotensive with elevated blood sugars.  I wonder if the seizure activity was actually a syncopal episode due to his significantly low  blood pressures.  Patient was given 2 L and blood pressures are getting better.  Patient is able to [facility] the whole time.  His sugars are significantly elevated into the 600s we will start patient on insulin drip.  I did get a CT head in case there was a seizure but there is no evidence of any mass.  Initial lactate was elevated but this could be from the seizures versus from the dehydration.  Patient will get a repeat lactate.  His creatinine is also elevated consistent with dehydration.  At this time he is afebrile and I do not feel that there is any source of infection I suspect this is more likely from the hyperglycemia and I will hold off on antibiotics but blood cultures are pending.  Do not feel like this represents an infection at this time.  Will discuss to the hospital team for admission  Pt is seen at the [facility] but states that he wants to stay here.  I think this is reasonable and that patient may be too unstable for transfer given patient is hypotensive and on insulin drip.       ____________________________________________   FINAL CLINICAL IMPRESSION(S) / ED DIAGNOSES   Final diagnoses:   AKI (acute kidney injury) ([place])  Hyperglycemia      MEDICATIONS GIVEN DURING THIS VISIT:  Medications  sodium chloride 0.9 % bolus 1,000 mL (0 mLs Intravenous Stopped 02/04/21 1835)  sodium chloride 0.9 % bolus 1,000 mL (0 mLs Intravenous Stopped 02/04/21 1828)     ED Discharge Orders     None        Note:  This document was prepared using Dragon voice recognition software and may include unintentional dictation errors.    Vanessa [place], MD 02/04/21 1844    Vanessa Craig, MD 02/04/21 [phone]

## 2021-02-05 ENCOUNTER — Telehealth: Payer: Self-pay

## 2021-02-05 ENCOUNTER — Inpatient Hospital Stay
Admit: 2021-02-05 | Discharge: 2021-02-05 | Disposition: A | Discharge status: Home / Self Care | Payer: Medicare Other | Attending: Internal Medicine | Admitting: Internal Medicine

## 2021-02-05 DIAGNOSIS — R739 Hyperglycemia, unspecified: Secondary | ICD-10-CM | POA: Diagnosis not present

## 2021-02-05 DIAGNOSIS — N179 Acute kidney failure, unspecified: Secondary | ICD-10-CM | POA: Diagnosis not present

## 2021-02-05 DIAGNOSIS — I959 Hypotension, unspecified: Secondary | ICD-10-CM

## 2021-02-05 DIAGNOSIS — R55 Syncope and collapse: Secondary | ICD-10-CM | POA: Diagnosis present

## 2021-02-05 LAB — BASIC METABOLIC PANEL
Anion gap: 5 (ref 5–15)
Anion gap: 7 (ref 5–15)
Anion gap: 9 (ref 5–15)
BUN: 22 mg/dL (ref 8–23)
BUN: 19 mg/dL (ref 8–23)
BUN: 17 mg/dL (ref 8–23)
CO2: 25 mmol/L (ref 22–32)
CO2: 22 mmol/L (ref 22–32)
CO2: 24 mmol/L (ref 22–32)
Calcium: 8 mg/dL — ABNORMAL LOW (ref 8.9–10.3)
Calcium: 8.1 mg/dL — ABNORMAL LOW (ref 8.9–10.3)
Calcium: 8.6 mg/dL — ABNORMAL LOW (ref 8.9–10.3)
Chloride: 106 mmol/L (ref 98–111)
Chloride: 110 mmol/L (ref 98–111)
Chloride: 104 mmol/L (ref 98–111)
Creatinine, Ser: 1.35 mg/dL — ABNORMAL HIGH (ref 0.61–1.24)
Creatinine, Ser: 1.12 mg/dL (ref 0.61–1.24)
Creatinine, Ser: 1.11 mg/dL (ref 0.61–1.24)
GFR, Estimated: 53 mL/min — ABNORMAL LOW (ref >60)
GFR, Estimated: >60 mL/min (ref >60)
GFR, Estimated: >60 mL/min (ref >60)
Glucose, Bld: 227 mg/dL — ABNORMAL HIGH (ref 70–99)
Glucose, Bld: 60 mg/dL — ABNORMAL LOW (ref 70–99)
Glucose, Bld: 289 mg/dL — ABNORMAL HIGH (ref 70–99)
Potassium: 3.4 mmol/L — ABNORMAL LOW (ref 3.5–5.1)
Potassium: 3.4 mmol/L — ABNORMAL LOW (ref 3.5–5.1)
Potassium: 4.5 mmol/L (ref 3.5–5.1)
Sodium: 136 mmol/L (ref 135–145)
Sodium: 139 mmol/L (ref 135–145)
Sodium: 137 mmol/L (ref 135–145)

## 2021-02-05 LAB — TSH: TSH: 2.62 u[IU]/mL (ref 0.450–4.500)

## 2021-02-05 LAB — CBG MONITORING, ED
Glucose-Capillary: 104 mg/dL — ABNORMAL HIGH (ref 70–99)
Glucose-Capillary: 118 mg/dL — ABNORMAL HIGH (ref 70–99)
Glucose-Capillary: 184 mg/dL — ABNORMAL HIGH (ref 70–99)
Glucose-Capillary: 213 mg/dL — ABNORMAL HIGH (ref 70–99)
Glucose-Capillary: 275 mg/dL — ABNORMAL HIGH (ref 70–99)
Glucose-Capillary: 335 mg/dL — ABNORMAL HIGH (ref 70–99)
Glucose-Capillary: 380 mg/dL — ABNORMAL HIGH (ref 70–99)
Glucose-Capillary: 521 mg/dL (ref 70–99)
Glucose-Capillary: 71 mg/dL (ref 70–99)

## 2021-02-05 LAB — COMPREHENSIVE METABOLIC PANEL
ALT: 12 IU/L (ref 0–44)
AST: 14 IU/L (ref 0–40)
Albumin/Globulin Ratio: 2 (ref 1.2–2.2)
Albumin: 3.9 g/dL (ref 3.7–4.7)
Alkaline Phosphatase: 95 IU/L (ref 44–121)
BUN/Creatinine Ratio: 15 (ref 10–24)
BUN: 23 mg/dL (ref 8–27)
Bilirubin Total: 0.7 mg/dL (ref 0.0–1.2)
CO2: 21 mmol/L (ref 20–29)
Calcium: 9.2 mg/dL (ref 8.6–10.2)
Chloride: 90 mmol/L — ABNORMAL LOW (ref 96–106)
Creatinine, Ser: 1.51 mg/dL — ABNORMAL HIGH (ref 0.76–1.27)
Globulin, Total: 2 g/dL (ref 1.5–4.5)
Glucose: 626 mg/dL (ref 65–99)
Potassium: 4.2 mmol/L (ref 3.5–5.2)
Sodium: 129 mmol/L — ABNORMAL LOW (ref 134–144)
Total Protein: 5.9 g/dL — ABNORMAL LOW (ref 6.0–8.5)
eGFR: 47 mL/min/{1.73_m2} — ABNORMAL LOW (ref >59)

## 2021-02-05 LAB — ECHOCARDIOGRAM COMPLETE
AR max vel: 1.96 cm2
AV Area VTI: 2.44 cm2
AV Area mean vel: 1.79 cm2
AV Mean grad: 6 mmHg
AV Peak grad: 10.6 mmHg
Ao pk vel: 1.63 m/s
Area-P 1/2: 2.87 cm2
Height: 69 in
S' Lateral: 2.7 cm
Weight: 2780.8 oz

## 2021-02-05 LAB — LIPID PANEL W/O CHOL/HDL RATIO
Cholesterol, Total: 173 mg/dL (ref 100–199)
HDL: 33 mg/dL — ABNORMAL LOW (ref >39)
LDL Chol Calc (NIH): 78 mg/dL (ref 0–99)
Triglycerides: 388 mg/dL — ABNORMAL HIGH (ref 0–149)
VLDL Cholesterol Cal: 62 mg/dL — ABNORMAL HIGH (ref 5–40)

## 2021-02-05 LAB — VITAMIN B12: Vitamin B-12: 470 pg/mL (ref 232–1245)

## 2021-02-05 LAB — CORTISOL-AM, BLOOD: Cortisol - AM: 8.9 ug/dL (ref 6.7–22.6)

## 2021-02-05 MED ORDER — INSULIN GLARGINE-YFGN 100 UNIT/ML ~~LOC~~ SOLN
20.0000 [IU] | Freq: Every day | SUBCUTANEOUS | Status: DC
  Administered 2021-02-05: 20 [IU] via SUBCUTANEOUS
  Filled 2021-02-05: qty 0.2

## 2021-02-05 MED ORDER — INSULIN ASPART 100 UNIT/ML IJ SOLN
3.0000 [IU] | Freq: Three times a day (TID) | INTRAMUSCULAR | Status: DC
  Administered 2021-02-05: 3 [IU] via SUBCUTANEOUS
  Filled 2021-02-05: qty 1

## 2021-02-05 MED ORDER — INSULIN ASPART 100 UNIT/ML IJ SOLN
0.0000 [IU] | Freq: Three times a day (TID) | INTRAMUSCULAR | Status: DC
  Administered 2021-02-05: 8 [IU] via SUBCUTANEOUS
  Filled 2021-02-05: qty 1

## 2021-02-05 MED ORDER — INSULIN ASPART 100 UNIT/ML IJ SOLN: 0.0000 [IU] | Freq: Every day | INTRAMUSCULAR | Status: DC

## 2021-02-05 MED ORDER — POTASSIUM CHLORIDE 10 MEQ/100ML IV SOLN
10.0000 meq | INTRAVENOUS | Status: AC
  Administered 2021-02-05 (×4): 10 meq via INTRAVENOUS
  Filled 2021-02-05: qty 100

## 2021-02-05 MED ORDER — POTASSIUM CHLORIDE CRYS ER 20 MEQ PO TBCR
40.0000 meq | EXTENDED_RELEASE_TABLET | Freq: Once | ORAL | Status: AC
  Administered 2021-02-05: 40 meq via ORAL
  Filled 2021-02-05: qty 2

## 2021-02-05 MED ORDER — INSULIN GLARGINE-YFGN 100 UNIT/ML ~~LOC~~ SOLN
20.0000 [IU] | SUBCUTANEOUS | Status: DC
  Filled 2021-02-05: qty 0.2

## 2021-02-05 MED ORDER — INSULIN LISPRO (1 UNIT DIAL) 100 UNIT/ML (KWIKPEN): 15.0000 [IU] | PEN_INJECTOR | Freq: Three times a day (TID) | SUBCUTANEOUS | 5 refills | Status: DC

## 2021-02-05 MED ORDER — INSULIN ASPART 100 UNIT/ML IJ SOLN: 10.0000 [IU] | Freq: Three times a day (TID) | INTRAMUSCULAR | Status: DC

## 2021-02-05 NOTE — Care Management CC44 (Signed)
Condition Code 44 Documentation Completed  Patient Details  Name: Scott Lawson. MRN: [id number] Date of Birth: August 08, 1940   Condition Code 44 given:  Yes Patient signature on Condition Code 44 notice:  Yes Documentation of 2 MD's agreement:  Yes Code 44 added to claim:  Yes    Adelene Amas, [place] 02/05/2021, 11:20 AM

## 2021-02-05 NOTE — Telephone Encounter (Signed)
Patience with [facility] calling critical glucose of 626.

## 2021-02-05 NOTE — Discharge Summary (Signed)
Physician Discharge Summary  Patient ID: Scott Lawson. MRN: [id number] DOB/AGE: 07-14-1940 80 y.o.  Admit date: 02/04/2021 Discharge date: 02/05/2021  Admission Diagnoses:  Discharge Diagnoses:  Principal Problem:   Uncontrolled type 2 diabetes mellitus with hyperosmolar nonketotic hyperglycemia (HCC) Active Problems:   Chronic kidney disease, stage 3a (HCC)   Mixed hyperlipidemia due to type 2 diabetes mellitus (HCC)   BPH (benign prostatic hyperplasia)   GERD without esophagitis   Chronic respiratory failure with hypoxia (HCC)   Interstitial lung disease (HCC)   Essential hypertension   Chronic diastolic CHF (congestive heart failure) ([place])   Syncope   Discharged Condition: good  Hospital Course:  80 year old male with past medical history of benign prostatic hyperplasia, chronic kidney disease stage IIIa, hyperlipidemia, hypertension, gastroesophageal reflux disease, insulin-dependent diabetes mellitus type 2 and 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  Patient also has acute kidney injury with a severe hyperglycemia. He was treated with IV insulin, he also received the fluids.  Renal function normalized.  Blood pressure is stabilized. At this point, patient refused to stay in the hospital.  In the meantime, he is also medically stable to be discharged.  #1.  Syncope and collapse. Hypotension Patient condition is not consistent with a seizure disorder based on clinical presentation. Telemetry did not show any arrhythmia.  Echocardiogram was performed in January 2022, ejection fraction was 60 to 65%.  Repeat echocardiogram did not see much change.  No valvular  disease. Patient presents with low blood pressure at time admission, consistent with dehydration.  This is multifactorial, including significant hyperglycemia, insufficient p.o. intake. Patient received IV fluids, condition had improved.  #2.  Acute kidney injury. Patient renal function had improved to normal after fluids.  This is not consistent with chronic kidney disease stage IIIa.  Patient had acute kidney injury.  Currently patient has normal renal function.  3.  Uncontrolled type 2 diabetes with hyperglycemia and  hyperosmolality. Patient recent hemoglobin A1c was more than 14.  He was given fluids, he was giving IV insulin.  Glucose is better.  Patient has been followed by his primary care physician, his insulin dose was recently adjusted, looks like he just take twice a day.  Based on his lab, he should resume back to his old regimen with 30 units of the Lantus as well as 15 units of short acting insulin 3 times a day.,  Patient is unwilling to make changes today, he want to talk to his family doctor.  #4.  Chronic respiratory failure secondary to recent COVID. Condition seem to have improved.  #5.  Essential hypertension Patient is taking lisinopril 5 mg daily.  We will continue.  Hypotension has resolved.   Consults: None  Significant Diagnostic Studies:   Treatments: IV hydration, iv insulin  Discharge Exam: Blood pressure 100/70, pulse 74, temperature (!) 97.5 F (36.4 C), temperature source Oral, resp. rate 16, height '5\' 9"'$  (1.753 m), weight 78.8 kg, SpO2 95 %. General appearance: alert and cooperative Resp: clear to auscultation bilaterally Cardio: regular rate and rhythm, S1, S2 normal, no murmur, click, rub or gallop GI: soft, non-tender; bowel sounds normal; no masses,  no organomegaly Extremities: extremities normal, atraumatic, no cyanosis or edema  Disposition: Discharge disposition: 01-Home or Self Care       Discharge Instructions     Diet - low sodium  heart healthy   Complete by: As directed    Increase activity slowly   Complete by: As directed       Allergies as of 02/05/2021       Reactions   Gabapentin Other (See Comments)   "made me fall out"        Medication List     STOP taking these medications    glipiZIDE 10 MG tablet Commonly known as: GLUCOTROL       TAKE these medications    albuterol 108 (90 Base) MCG/ACT inhaler Commonly known as: VENTOLIN HFA Inhale 2 puffs into the lungs every 6 (six) hours as needed for wheezing or shortness of breath.   aspirin EC 81 MG tablet Take 81 mg by mouth daily. Swallow whole.   atorvastatin 80 MG tablet Commonly known as: LIPITOR TAKE 1 TABLET EVERY DAY   fluticasone 50 MCG/ACT nasal spray Commonly known as: FLONASE Place 2 sprays into both nostrils daily as needed.   insulin glargine 100 UNIT/ML injection Commonly known as: LANTUS Inject 0.35 mLs (35 Units total) into the skin every evening. 1800   insulin lispro 100 UNIT/ML KwikPen Commonly known as: HumaLOG KwikPen Inject 15 Units into the skin 3 (three) times daily before meals. Continue to check blood sugar after meals.   lisinopril 5 MG tablet Commonly known as: ZESTRIL Take 5 mg by mouth daily.   metFORMIN 500 MG tablet Commonly known as: GLUCOPHAGE Take 2 tablets (1,000 mg total) by mouth 2 (two) times daily with a meal. '1000mg'$  in the morning '1000mg'$  at night   OMEGA 3 500 PO Take 500 mg by mouth daily.   omeprazole 20 MG capsule Commonly known as: PRILOSEC Take 1 capsule (20 mg total) by mouth daily.   predniSONE 20 MG tablet Commonly known as: DELTASONE Take 1 tablet by mouth daily.   tamsulosin 0.4 MG Caps capsule Commonly known as: FLOMAX TAKE 1 CAPSULE EVERY DAY   True Metrix Blood Glucose Test test strip Generic drug: glucose blood CHECK BLOOD SUGAR TWICE DAILY   TRUEplus Lancets 33G Misc CHECK BLOOD SUGAR TWICE DAILY        Follow-up Information     Marnee Guarneri T,  NP Follow up in 1 week(s).   Specialty: Nurse Practitioner Contact information: [address] [place] [address] [phone]                 Signed: Sharen Hones 02/05/2021, 10:58 AM

## 2021-02-05 NOTE — ED Notes (Signed)
Pt given warm blnket

## 2021-02-05 NOTE — Telephone Encounter (Signed)
Routing to provider. Patient currently at [facility] according to chart.

## 2021-02-05 NOTE — Telephone Encounter (Signed)
Noted, patient inpatient.

## 2021-02-05 NOTE — Progress Notes (Signed)
Inpatient Diabetes Program Recommendations  AACE/ADA: New Consensus Statement on Inpatient Glycemic Control (2015)  Target Ranges:  Prepandial:   less than 140 mg/dL      Peak postprandial:   less than 180 mg/dL (1-2 hours)      Critically ill patients:  140 - 180 mg/dL   Lab Results  Component Value Date   GLUCAP 275 (H) 02/05/2021   HGBA1C >14.0 (H) 02/04/2021    Review of Glycemic Control  Diabetes history: DM2 Outpatient Diabetes medications: Lantus 35 units, Glucotrol 10 mg qd,Metformin 1 gm bid Current orders for Inpatient glycemic control: Semglee 20 units, Novolog 10 units tid meal coverage, Novolog 0-15 units correction tid, 0-5 units hs  Inpatient Diabetes Program Recommendations:   IV insulin discontinued @ 0426 with basal insulin given CBG rechecked 4 hrs later was 275.  Please consider increase in Semglee to 30 units qd.  I spoke with daughter on contact list  [place] and she expressed concern that pt. is not being cared for @ home by another daughter and grand daughter. This contact daughter does not know if pt. has been taking his insulin @ home or not. Placed consult to transition of care and secure chat sent to Dr. Roosevelt Locks.  Thank you, Nani Gasser. Aijalon Demuro, RN, MSN, CDE  Diabetes Coordinator Inpatient Glycemic Control Team Team Pager [phone] (8am-5pm) 02/05/2021 11:02 AM

## 2021-02-05 NOTE — ED Notes (Signed)
Sharion Settler NP notified of of cbg

## 2021-02-05 NOTE — ED Notes (Signed)
Patient wanting to speak with granddaughter, this RN and registration attempted to call with no success at this time. Patient upset and telling staff he wants to leave and he is not being taken care of. MD paged by primary RN at this time.

## 2021-02-05 NOTE — Progress Notes (Signed)
*  PRELIMINARY RESULTS* Echocardiogram 2D Echocardiogram has been performed.  Sherrie Sport 02/05/2021, 10:32 AM

## 2021-02-05 NOTE — ED Notes (Signed)
MD paged regarding pt requesting to be discharged. Pt aware that MD will be down shortly.

## 2021-02-05 NOTE — Progress Notes (Signed)
In hospital at this time, aware of labs.

## 2021-02-05 NOTE — Care Management Obs Status (Signed)
[place] NOTIFICATION   Patient Details  Name: Scott Lawson. MRN: [id number] Date of Birth: [birth year]   Medicare Observation Status Notification Given:  Yes    Adelene Amas, [place] 02/05/2021, 11:20 AM

## 2021-02-05 NOTE — ED Notes (Signed)
Pt given crackers and apple sauce

## 2021-02-05 NOTE — ED Notes (Signed)
Phlebotomy staff called to draw blood

## 2021-02-05 NOTE — TOC Initial Note (Signed)
Transition of Care (TOC) - Initial/Assessment Note    Patient Details  Name: Scott Lawson. MRN: [id number] Date of Birth: [birth year]  Transition of Care [facility]) CM/SW Contact:    Ova Freshwater Phone Number: [phone] 02/05/2021, 11:49 AM  Clinical Narrative:                  Patient will d/c home with [facility] services. Attending and EDRN have been updated.         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[place]) 02/04/2021   Weight loss, unintentional 02/04/2021   Uncontrolled type 2 diabetes mellitus with hyperosmolar nonketotic hyperglycemia ([place]) 02/04/2021   Essential hypertension 02/04/2021   Chronic diastolic CHF (congestive heart failure) ([place]) 02/04/2021   Syncope 02/04/2021   Emphysema lung ([place]) 10/26/2020   History of 2019 novel coronavirus disease (COVID-19) 10/26/2020   Interstitial lung disease ([place]) 08/24/2020   Chronic respiratory failure with hypoxia ([place]) 06/20/2020   Atherosclerosis of aorta ([place]) 10/12/2019   Long-term insulin use ([place]) 10/12/2019   Vitamin B12 deficiency 06/28/2018   Prostate cancer ([place]) 06/27/2018   Psoriasis 08/16/2017   Advanced care planning/counseling discussion 12/01/2016   GERD without esophagitis 08/14/2015   Uncontrolled type 2 diabetes mellitus with chronic kidney disease ([place]) 08/13/2015   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[place]) 08/13/2015   Chronic kidney disease, stage 3a ([place]) 02/11/2015   Mixed hyperlipidemia due to type 2 diabetes mellitus ([place]) 02/11/2015   BPH (benign prostatic hyperplasia) 02/11/2015   PCP:  Venita Lick, NP Pharmacy:   [facility] - [place], [address] [place] [address] Phone: [phone] Fax: [phone]  [facility] [id number], [place] - [address] [place] [address] Phone: [phone] Fax: [place], [address] [place] [address] Phone: [phone] Fax: [phone]     Social Determinants of Health (SDOH) Interventions    Readmission Risk Interventions No flowsheet data found.

## 2021-02-06 ENCOUNTER — Telehealth: Payer: Self-pay

## 2021-02-06 NOTE — Telephone Encounter (Signed)
Attempted to contact patient's granddaughter [name] to schedule a Palliative Care consult appointment. No answer and unable to leave a message voicemail is full.

## 2021-02-09 LAB — CULTURE, BLOOD (ROUTINE X 2)
Culture: NO GROWTH
Culture: NO GROWTH
Special Requests: ADEQUATE

## 2021-02-14 ENCOUNTER — Telehealth: Payer: Self-pay

## 2021-02-14 ENCOUNTER — Telehealth: Payer: Medicare Other

## 2021-02-14 NOTE — Telephone Encounter (Signed)
  Care Management   Follow Up Note   02/14/2021 Name: Scott Lawson. MRN: [id number] DOB: 04-Jan-1941   Referred by: Venita Lick, NP Reason for referral : Chronic Care Management (RNCM: Initial Outreach for Chronic Disease Management and Care Coordination Needs)   An unsuccessful telephone outreach was attempted today. The patient was referred to the case management team for assistance with care management and care coordination.   Follow Up Plan: A HIPPA compliant phone message was left for the patient providing contact information and requesting a return call.   Noreene Larsson RN, MSN, [facility] Mobile: [phone]

## 2021-02-20 ENCOUNTER — Telehealth: Payer: Self-pay | Admitting: Primary Care

## 2021-02-20 NOTE — Telephone Encounter (Signed)
Spoke with patient's granddaughter, Kahlan Bucceri, regarding the Palliative referral/services and she was in agreement with scheduling visit.  In-home Consult was scheduled for 03/11/21 @ 12:30 PM

## 2021-02-24 ENCOUNTER — Telehealth: Payer: Self-pay

## 2021-02-24 NOTE — Chronic Care Management (AMB) (Signed)
  Care Management   Note  02/24/2021 Name: Jahmad Mercedes. MRN: [id number] DOB: 1940/11/28  Princess Bruins Dutta [name] is a 80 y.o. year old male who is a primary care patient of Venita Lick, NP and is actively engaged with the care management team. I reached out to [name] by phone today to assist with re-scheduling a follow up visit with the RN Case Manager  Follow up plan: Unsuccessful telephone outreach attempt made. A HIPAA compliant phone message was left for the patient providing contact information and requesting a return call.  The care management team will reach out to the patient again over the next 7 days.  If patient returns call to provider office, please advise to call [facility]  at [address], [address], [place], [place] [address] Direct Dial: [phone] [email or web address]: [email or web address]

## 2021-02-24 NOTE — Chronic Care Management (AMB) (Signed)
  Care Management   Note  02/24/2021 Name: Scott Lawson. MRN: [id number] DOB: 06-19-1940  Princess Bruins Strite [name] is a 80 y.o. year old male who is a primary care patient of Venita Lick, NP and is actively engaged with the care management team. I reached out to [name] by phone today to assist with re-scheduling a follow up visit with the RN Case Manager  Follow up plan: Telephone appointment with care management team member scheduled for:03/04/2021  Noreene Larsson, [place], [place] [facility]  [place], [place] [address] Direct Dial: [phone] [email or web address]: [email or web address]

## 2021-02-26 DIAGNOSIS — J479 Bronchiectasis, uncomplicated: Secondary | ICD-10-CM | POA: Diagnosis not present

## 2021-02-27 DIAGNOSIS — N183 Chronic kidney disease, stage 3 unspecified: Secondary | ICD-10-CM | POA: Diagnosis not present

## 2021-02-27 DIAGNOSIS — U071 COVID-19: Secondary | ICD-10-CM | POA: Diagnosis not present

## 2021-02-28 ENCOUNTER — Inpatient Hospital Stay
Admission: EM | Admit: 2021-02-28 | Discharge: 2021-03-04 | DRG: 871 | Disposition: A | Discharge status: Skilled Nursing Facility | Payer: Medicare Other | Attending: Internal Medicine | Admitting: Internal Medicine

## 2021-02-28 ENCOUNTER — Emergency Department: Payer: Medicare Other

## 2021-02-28 ENCOUNTER — Encounter: Payer: Self-pay | Admitting: Emergency Medicine

## 2021-02-28 ENCOUNTER — Inpatient Hospital Stay: Payer: Medicare Other

## 2021-02-28 ENCOUNTER — Other Ambulatory Visit: Payer: Self-pay

## 2021-02-28 ENCOUNTER — Inpatient Hospital Stay: Payer: Self-pay

## 2021-02-28 DIAGNOSIS — M50322 Other cervical disc degeneration at C5-C6 level: Secondary | ICD-10-CM | POA: Diagnosis not present

## 2021-02-28 DIAGNOSIS — E1165 Type 2 diabetes mellitus with hyperglycemia: Secondary | ICD-10-CM | POA: Diagnosis not present

## 2021-02-28 DIAGNOSIS — Z8616 Personal history of COVID-19: Secondary | ICD-10-CM | POA: Diagnosis not present

## 2021-02-28 DIAGNOSIS — Z9079 Acquired absence of other genital organ(s): Secondary | ICD-10-CM | POA: Diagnosis not present

## 2021-02-28 DIAGNOSIS — J157 Pneumonia due to Mycoplasma pneumoniae: Secondary | ICD-10-CM | POA: Diagnosis present

## 2021-02-28 DIAGNOSIS — R55 Syncope and collapse: Secondary | ICD-10-CM | POA: Diagnosis present

## 2021-02-28 DIAGNOSIS — K219 Gastro-esophageal reflux disease without esophagitis: Secondary | ICD-10-CM | POA: Diagnosis present

## 2021-02-28 DIAGNOSIS — Z8249 Family history of ischemic heart disease and other diseases of the circulatory system: Secondary | ICD-10-CM | POA: Diagnosis not present

## 2021-02-28 DIAGNOSIS — J984 Other disorders of lung: Secondary | ICD-10-CM | POA: Diagnosis not present

## 2021-02-28 DIAGNOSIS — E782 Mixed hyperlipidemia: Secondary | ICD-10-CM | POA: Diagnosis present

## 2021-02-28 DIAGNOSIS — N400 Enlarged prostate without lower urinary tract symptoms: Secondary | ICD-10-CM | POA: Diagnosis present

## 2021-02-28 DIAGNOSIS — G9341 Metabolic encephalopathy: Secondary | ICD-10-CM | POA: Diagnosis not present

## 2021-02-28 DIAGNOSIS — Z809 Family history of malignant neoplasm, unspecified: Secondary | ICD-10-CM

## 2021-02-28 DIAGNOSIS — J189 Pneumonia, unspecified organism: Secondary | ICD-10-CM | POA: Diagnosis not present

## 2021-02-28 DIAGNOSIS — D649 Anemia, unspecified: Secondary | ICD-10-CM | POA: Diagnosis not present

## 2021-02-28 DIAGNOSIS — M50321 Other cervical disc degeneration at C4-C5 level: Secondary | ICD-10-CM | POA: Diagnosis not present

## 2021-02-28 DIAGNOSIS — R579 Shock, unspecified: Secondary | ICD-10-CM

## 2021-02-28 DIAGNOSIS — R911 Solitary pulmonary nodule: Secondary | ICD-10-CM | POA: Diagnosis not present

## 2021-02-28 DIAGNOSIS — Z833 Family history of diabetes mellitus: Secondary | ICD-10-CM

## 2021-02-28 DIAGNOSIS — Z794 Long term (current) use of insulin: Secondary | ICD-10-CM

## 2021-02-28 DIAGNOSIS — E785 Hyperlipidemia, unspecified: Secondary | ICD-10-CM | POA: Diagnosis not present

## 2021-02-28 DIAGNOSIS — S0990XA Unspecified injury of head, initial encounter: Secondary | ICD-10-CM | POA: Diagnosis not present

## 2021-02-28 DIAGNOSIS — E1122 Type 2 diabetes mellitus with diabetic chronic kidney disease: Secondary | ICD-10-CM | POA: Diagnosis present

## 2021-02-28 DIAGNOSIS — IMO0002 Reserved for concepts with insufficient information to code with codable children: Secondary | ICD-10-CM | POA: Diagnosis present

## 2021-02-28 DIAGNOSIS — N189 Chronic kidney disease, unspecified: Secondary | ICD-10-CM

## 2021-02-28 DIAGNOSIS — J470 Bronchiectasis with acute lower respiratory infection: Secondary | ICD-10-CM | POA: Diagnosis present

## 2021-02-28 DIAGNOSIS — R571 Hypovolemic shock: Secondary | ICD-10-CM | POA: Diagnosis present

## 2021-02-28 DIAGNOSIS — J479 Bronchiectasis, uncomplicated: Secondary | ICD-10-CM | POA: Diagnosis not present

## 2021-02-28 DIAGNOSIS — Z7984 Long term (current) use of oral hypoglycemic drugs: Secondary | ICD-10-CM | POA: Diagnosis not present

## 2021-02-28 DIAGNOSIS — R351 Nocturia: Secondary | ICD-10-CM | POA: Diagnosis present

## 2021-02-28 DIAGNOSIS — N179 Acute kidney failure, unspecified: Secondary | ICD-10-CM | POA: Diagnosis not present

## 2021-02-28 DIAGNOSIS — R739 Hyperglycemia, unspecified: Secondary | ICD-10-CM

## 2021-02-28 DIAGNOSIS — E1169 Type 2 diabetes mellitus with other specified complication: Secondary | ICD-10-CM | POA: Diagnosis present

## 2021-02-28 DIAGNOSIS — I10 Essential (primary) hypertension: Secondary | ICD-10-CM | POA: Diagnosis not present

## 2021-02-28 DIAGNOSIS — Y92009 Unspecified place in unspecified non-institutional (private) residence as the place of occurrence of the external cause: Secondary | ICD-10-CM

## 2021-02-28 DIAGNOSIS — E861 Hypovolemia: Secondary | ICD-10-CM | POA: Diagnosis not present

## 2021-02-28 DIAGNOSIS — R6521 Severe sepsis with septic shock: Secondary | ICD-10-CM | POA: Diagnosis not present

## 2021-02-28 DIAGNOSIS — Z9181 History of falling: Secondary | ICD-10-CM

## 2021-02-28 DIAGNOSIS — J9601 Acute respiratory failure with hypoxia: Secondary | ICD-10-CM | POA: Diagnosis present

## 2021-02-28 DIAGNOSIS — R6889 Other general symptoms and signs: Secondary | ICD-10-CM | POA: Diagnosis not present

## 2021-02-28 DIAGNOSIS — E781 Pure hyperglyceridemia: Secondary | ICD-10-CM | POA: Diagnosis present

## 2021-02-28 DIAGNOSIS — W19XXXA Unspecified fall, initial encounter: Secondary | ICD-10-CM | POA: Diagnosis present

## 2021-02-28 DIAGNOSIS — R0602 Shortness of breath: Secondary | ICD-10-CM | POA: Diagnosis not present

## 2021-02-28 DIAGNOSIS — R079 Chest pain, unspecified: Secondary | ICD-10-CM | POA: Diagnosis not present

## 2021-02-28 DIAGNOSIS — R296 Repeated falls: Secondary | ICD-10-CM | POA: Diagnosis present

## 2021-02-28 DIAGNOSIS — R410 Disorientation, unspecified: Secondary | ICD-10-CM | POA: Diagnosis not present

## 2021-02-28 DIAGNOSIS — A419 Sepsis, unspecified organism: Principal | ICD-10-CM | POA: Diagnosis present

## 2021-02-28 DIAGNOSIS — G319 Degenerative disease of nervous system, unspecified: Secondary | ICD-10-CM | POA: Diagnosis not present

## 2021-02-28 DIAGNOSIS — I517 Cardiomegaly: Secondary | ICD-10-CM | POA: Diagnosis not present

## 2021-02-28 DIAGNOSIS — I700 Atherosclerosis of aorta: Secondary | ICD-10-CM | POA: Diagnosis not present

## 2021-02-28 DIAGNOSIS — Z9114 Patient's other noncompliance with medication regimen: Secondary | ICD-10-CM

## 2021-02-28 DIAGNOSIS — Z743 Need for continuous supervision: Secondary | ICD-10-CM | POA: Diagnosis not present

## 2021-02-28 DIAGNOSIS — Z7189 Other specified counseling: Secondary | ICD-10-CM

## 2021-02-28 DIAGNOSIS — Z9119 Patient's noncompliance with other medical treatment and regimen: Secondary | ICD-10-CM

## 2021-02-28 DIAGNOSIS — Z515 Encounter for palliative care: Secondary | ICD-10-CM | POA: Diagnosis not present

## 2021-02-28 DIAGNOSIS — E1100 Type 2 diabetes mellitus with hyperosmolarity without nonketotic hyperglycemic-hyperosmolar coma (NKHHC): Secondary | ICD-10-CM | POA: Diagnosis not present

## 2021-02-28 DIAGNOSIS — I739 Peripheral vascular disease, unspecified: Secondary | ICD-10-CM | POA: Diagnosis not present

## 2021-02-28 DIAGNOSIS — M50323 Other cervical disc degeneration at C6-C7 level: Secondary | ICD-10-CM | POA: Diagnosis not present

## 2021-02-28 DIAGNOSIS — S199XXA Unspecified injury of neck, initial encounter: Secondary | ICD-10-CM | POA: Diagnosis not present

## 2021-02-28 DIAGNOSIS — R918 Other nonspecific abnormal finding of lung field: Secondary | ICD-10-CM | POA: Diagnosis not present

## 2021-02-28 DIAGNOSIS — R778 Other specified abnormalities of plasma proteins: Secondary | ICD-10-CM | POA: Diagnosis present

## 2021-02-28 DIAGNOSIS — R0902 Hypoxemia: Secondary | ICD-10-CM | POA: Diagnosis not present

## 2021-02-28 LAB — BASIC METABOLIC PANEL
Anion gap: 12 (ref 5–15)
Anion gap: 11 (ref 5–15)
Anion gap: 10 (ref 5–15)
Anion gap: 9 (ref 5–15)
BUN: 25 mg/dL — ABNORMAL HIGH (ref 8–23)
BUN: 24 mg/dL — ABNORMAL HIGH (ref 8–23)
BUN: 24 mg/dL — ABNORMAL HIGH (ref 8–23)
BUN: 21 mg/dL (ref 8–23)
CO2: 23 mmol/L (ref 22–32)
CO2: 26 mmol/L (ref 22–32)
CO2: 26 mmol/L (ref 22–32)
CO2: 24 mmol/L (ref 22–32)
Calcium: 8.5 mg/dL — ABNORMAL LOW (ref 8.9–10.3)
Calcium: 8.9 mg/dL (ref 8.9–10.3)
Calcium: 9 mg/dL (ref 8.9–10.3)
Calcium: 8.8 mg/dL — ABNORMAL LOW (ref 8.9–10.3)
Chloride: 89 mmol/L — ABNORMAL LOW (ref 98–111)
Chloride: 93 mmol/L — ABNORMAL LOW (ref 98–111)
Chloride: 96 mmol/L — ABNORMAL LOW (ref 98–111)
Chloride: 100 mmol/L (ref 98–111)
Creatinine, Ser: 1.77 mg/dL — ABNORMAL HIGH (ref 0.61–1.24)
Creatinine, Ser: 1.7 mg/dL — ABNORMAL HIGH (ref 0.61–1.24)
Creatinine, Ser: 1.6 mg/dL — ABNORMAL HIGH (ref 0.61–1.24)
Creatinine, Ser: 1.36 mg/dL — ABNORMAL HIGH (ref 0.61–1.24)
GFR, Estimated: 39 mL/min — ABNORMAL LOW (ref >60)
GFR, Estimated: 41 mL/min — ABNORMAL LOW (ref >60)
GFR, Estimated: 44 mL/min — ABNORMAL LOW (ref >60)
GFR, Estimated: 53 mL/min — ABNORMAL LOW (ref >60)
Glucose, Bld: 666 mg/dL (ref 70–99)
Glucose, Bld: 357 mg/dL — ABNORMAL HIGH (ref 70–99)
Glucose, Bld: 188 mg/dL — ABNORMAL HIGH (ref 70–99)
Glucose, Bld: 149 mg/dL — ABNORMAL HIGH (ref 70–99)
Potassium: 4.8 mmol/L (ref 3.5–5.1)
Potassium: 3.3 mmol/L — ABNORMAL LOW (ref 3.5–5.1)
Potassium: 3.1 mmol/L — ABNORMAL LOW (ref 3.5–5.1)
Potassium: 3.7 mmol/L (ref 3.5–5.1)
Sodium: 124 mmol/L — ABNORMAL LOW (ref 135–145)
Sodium: 130 mmol/L — ABNORMAL LOW (ref 135–145)
Sodium: 132 mmol/L — ABNORMAL LOW (ref 135–145)
Sodium: 133 mmol/L — ABNORMAL LOW (ref 135–145)

## 2021-02-28 LAB — GLUCOSE, CAPILLARY
Glucose-Capillary: 138 mg/dL — ABNORMAL HIGH (ref 70–99)
Glucose-Capillary: 154 mg/dL — ABNORMAL HIGH (ref 70–99)
Glucose-Capillary: 154 mg/dL — ABNORMAL HIGH (ref 70–99)
Glucose-Capillary: 172 mg/dL — ABNORMAL HIGH (ref 70–99)
Glucose-Capillary: 242 mg/dL — ABNORMAL HIGH (ref 70–99)
Glucose-Capillary: 256 mg/dL — ABNORMAL HIGH (ref 70–99)

## 2021-02-28 LAB — COMPREHENSIVE METABOLIC PANEL
ALT: 11 U/L (ref 0–44)
AST: 15 U/L (ref 15–41)
Albumin: 3.8 g/dL (ref 3.5–5.0)
Alkaline Phosphatase: 98 U/L (ref 38–126)
Anion gap: 13 (ref 5–15)
BUN: 29 mg/dL — ABNORMAL HIGH (ref 8–23)
CO2: 25 mmol/L (ref 22–32)
Calcium: 9.4 mg/dL (ref 8.9–10.3)
Chloride: 87 mmol/L — ABNORMAL LOW (ref 98–111)
Creatinine, Ser: 2.13 mg/dL — ABNORMAL HIGH (ref 0.61–1.24)
GFR, Estimated: 31 mL/min — ABNORMAL LOW (ref >60)
Glucose, Bld: 755 mg/dL (ref 70–99)
Potassium: 4.1 mmol/L (ref 3.5–5.1)
Sodium: 125 mmol/L — ABNORMAL LOW (ref 135–145)
Total Bilirubin: 1.5 mg/dL — ABNORMAL HIGH (ref 0.3–1.2)
Total Protein: 6.9 g/dL (ref 6.5–8.1)

## 2021-02-28 LAB — CBG MONITORING, ED
Glucose-Capillary: >600 mg/dL (ref 70–99)
Glucose-Capillary: >600 mg/dL (ref 70–99)
Glucose-Capillary: >600 mg/dL (ref 70–99)
Glucose-Capillary: >600 mg/dL (ref 70–99)
Glucose-Capillary: 504 mg/dL (ref 70–99)
Glucose-Capillary: 450 mg/dL — ABNORMAL HIGH (ref 70–99)
Glucose-Capillary: 458 mg/dL — ABNORMAL HIGH (ref 70–99)
Glucose-Capillary: 357 mg/dL — ABNORMAL HIGH (ref 70–99)
Glucose-Capillary: 381 mg/dL — ABNORMAL HIGH (ref 70–99)
Glucose-Capillary: 308 mg/dL — ABNORMAL HIGH (ref 70–99)

## 2021-02-28 LAB — TROPONIN I (HIGH SENSITIVITY)
Troponin I (High Sensitivity): 39 ng/L — ABNORMAL HIGH (ref <18)
Troponin I (High Sensitivity): 33 ng/L — ABNORMAL HIGH (ref <18)

## 2021-02-28 LAB — LACTIC ACID, PLASMA
Lactic Acid, Venous: 2.4 mmol/L (ref 0.5–1.9)
Lactic Acid, Venous: 3.3 mmol/L (ref 0.5–1.9)
Lactic Acid, Venous: 4.4 mmol/L (ref 0.5–1.9)
Lactic Acid, Venous: 3.3 mmol/L (ref 0.5–1.9)

## 2021-02-28 LAB — URINALYSIS, COMPLETE (UACMP) WITH MICROSCOPIC
Bacteria, UA: NONE SEEN
Bilirubin Urine: NEGATIVE
Glucose, UA: >=500 mg/dL — AB
Hgb urine dipstick: NEGATIVE
Ketones, ur: NEGATIVE mg/dL
Leukocytes,Ua: NEGATIVE
Nitrite: NEGATIVE
Protein, ur: NEGATIVE mg/dL
Specific Gravity, Urine: 1.025 (ref 1.005–1.030)
pH: 6 (ref 5.0–8.0)

## 2021-02-28 LAB — STREP PNEUMONIAE URINARY ANTIGEN: Strep Pneumo Urinary Antigen: NEGATIVE

## 2021-02-28 LAB — MRSA NEXT GEN BY PCR, NASAL: MRSA by PCR Next Gen: NOT DETECTED

## 2021-02-28 LAB — CBC WITH DIFFERENTIAL/PLATELET
Abs Immature Granulocytes: 0.06 K/uL (ref 0.00–0.07)
Basophils Absolute: 0.1 K/uL (ref 0.0–0.1)
Basophils Relative: 1 %
Eosinophils Absolute: 0.2 K/uL (ref 0.0–0.5)
Eosinophils Relative: 1 %
HCT: 44.3 % (ref 39.0–52.0)
Hemoglobin: 15.2 g/dL (ref 13.0–17.0)
Immature Granulocytes: 1 %
Lymphocytes Relative: 35 %
Lymphs Abs: 3.7 K/uL (ref 0.7–4.0)
MCH: 30.8 pg (ref 26.0–34.0)
MCHC: 34.3 g/dL (ref 30.0–36.0)
MCV: 89.7 fL (ref 80.0–100.0)
Monocytes Absolute: 1.1 K/uL — ABNORMAL HIGH (ref 0.1–1.0)
Monocytes Relative: 10 %
Neutro Abs: 5.4 K/uL (ref 1.7–7.7)
Neutrophils Relative %: 52 %
Platelets: 259 K/uL (ref 150–400)
RBC: 4.94 MIL/uL (ref 4.22–5.81)
RDW: 12.6 % (ref 11.5–15.5)
WBC: 10.5 K/uL (ref 4.0–10.5)
nRBC: 0 % (ref 0.0–0.2)

## 2021-02-28 LAB — RESP PANEL BY RT-PCR (FLU A&B, COVID) ARPGX2
Influenza A by PCR: NEGATIVE
Influenza B by PCR: NEGATIVE
SARS Coronavirus 2 by RT PCR: NEGATIVE

## 2021-02-28 LAB — PROCALCITONIN: Procalcitonin: <0.1 ng/mL

## 2021-02-28 LAB — OSMOLALITY: Osmolality: 306 mOsm/kg — ABNORMAL HIGH (ref 275–295)

## 2021-02-28 LAB — CORTISOL: Cortisol, Plasma: 16.2 ug/dL

## 2021-02-28 MED ORDER — NOREPINEPHRINE 4 MG/250ML-% IV SOLN
0.0000 ug/min | INTRAVENOUS | Status: DC
  Administered 2021-02-28: 2 ug/min via INTRAVENOUS
  Administered 2021-02-28: 8 ug/min via INTRAVENOUS
  Administered 2021-03-01: 7 ug/min via INTRAVENOUS
  Administered 2021-03-01: 8 ug/min via INTRAVENOUS
  Administered 2021-03-02: 3 ug/min via INTRAVENOUS
  Filled 2021-02-28 (×5): qty 250

## 2021-02-28 MED ORDER — DEXTROSE 50 % IV SOLN: 0.0000 mL | INTRAVENOUS | Status: DC | PRN

## 2021-02-28 MED ORDER — INSULIN ASPART 100 UNIT/ML IJ SOLN
0.0000 [IU] | Freq: Three times a day (TID) | INTRAMUSCULAR | Status: DC
  Administered 2021-03-01: 11 [IU] via SUBCUTANEOUS
  Administered 2021-03-01: 3 [IU] via SUBCUTANEOUS
  Filled 2021-02-28 (×2): qty 1

## 2021-02-28 MED ORDER — POTASSIUM CHLORIDE 10 MEQ/100ML IV SOLN: 10.0000 meq | INTRAVENOUS | Status: DC

## 2021-02-28 MED ORDER — SODIUM CHLORIDE 0.9 % IV SOLN
2.0000 g | Freq: Two times a day (BID) | INTRAVENOUS | Status: DC
  Administered 2021-03-01 – 2021-03-03 (×6): 2 g via INTRAVENOUS
  Filled 2021-02-28 (×8): qty 2

## 2021-02-28 MED ORDER — SODIUM CHLORIDE 0.9% FLUSH
10.0000 mL | INTRAVENOUS | Status: DC | PRN
  Administered 2021-03-01 (×2): 10 mL

## 2021-02-28 MED ORDER — FENTANYL CITRATE PF 50 MCG/ML IJ SOSY
25.0000 ug | PREFILLED_SYRINGE | Freq: Once | INTRAMUSCULAR | Status: AC
  Administered 2021-02-28: 25 ug via INTRAVENOUS

## 2021-02-28 MED ORDER — SODIUM CHLORIDE 0.9 % IV SOLN
2.0000 g | Freq: Once | INTRAVENOUS | Status: AC
  Administered 2021-02-28: 2 g via INTRAVENOUS
  Filled 2021-02-28: qty 2

## 2021-02-28 MED ORDER — POTASSIUM CHLORIDE 10 MEQ/100ML IV SOLN
10.0000 meq | INTRAVENOUS | Status: AC
  Administered 2021-02-28 – 2021-03-01 (×3): 10 meq via INTRAVENOUS
  Filled 2021-02-28 (×3): qty 100

## 2021-02-28 MED ORDER — VANCOMYCIN HCL 750 MG/150ML IV SOLN
750.0000 mg | Freq: Once | INTRAVENOUS | Status: AC
  Administered 2021-02-28: 750 mg via INTRAVENOUS
  Filled 2021-02-28: qty 150

## 2021-02-28 MED ORDER — MIDAZOLAM HCL 2 MG/2ML IJ SOLN
1.0000 mg | Freq: Once | INTRAMUSCULAR | Status: AC
  Administered 2021-02-28: 1 mg via INTRAVENOUS
  Filled 2021-02-28: qty 2

## 2021-02-28 MED ORDER — PANTOPRAZOLE SODIUM 40 MG PO TBEC
40.0000 mg | DELAYED_RELEASE_TABLET | Freq: Every day | ORAL | Status: DC
  Administered 2021-02-28 – 2021-03-04 (×5): 40 mg via ORAL
  Filled 2021-02-28 (×5): qty 1

## 2021-02-28 MED ORDER — MIDAZOLAM HCL 2 MG/2ML IJ SOLN: 2.0000 mg | Freq: Once | INTRAMUSCULAR | Status: AC

## 2021-02-28 MED ORDER — POTASSIUM CHLORIDE 10 MEQ/100ML IV SOLN
10.0000 meq | INTRAVENOUS | Status: AC
  Administered 2021-02-28 (×2): 10 meq via INTRAVENOUS
  Filled 2021-02-28 (×2): qty 100

## 2021-02-28 MED ORDER — CHLORHEXIDINE GLUCONATE CLOTH 2 % EX PADS
6.0000 | MEDICATED_PAD | Freq: Every day | CUTANEOUS | Status: DC
  Administered 2021-02-28 – 2021-03-02 (×3): 6 via TOPICAL

## 2021-02-28 MED ORDER — LACTATED RINGERS IV BOLUS
1000.0000 mL | Freq: Once | INTRAVENOUS | Status: AC
  Administered 2021-02-28: 1000 mL via INTRAVENOUS

## 2021-02-28 MED ORDER — INSULIN DETEMIR 100 UNIT/ML ~~LOC~~ SOLN
0.3000 [IU]/kg | SUBCUTANEOUS | Status: DC
  Administered 2021-02-28 – 2021-03-01 (×2): 23 [IU] via SUBCUTANEOUS
  Filled 2021-02-28 (×2): qty 0.23

## 2021-02-28 MED ORDER — ASPIRIN EC 81 MG PO TBEC
81.0000 mg | DELAYED_RELEASE_TABLET | Freq: Every day | ORAL | Status: DC
  Administered 2021-02-28 – 2021-03-04 (×5): 81 mg via ORAL
  Filled 2021-02-28 (×5): qty 1

## 2021-02-28 MED ORDER — LACTATED RINGERS IV SOLN: INTRAVENOUS | Status: DC

## 2021-02-28 MED ORDER — INSULIN ASPART 100 UNIT/ML IJ SOLN: 0.0000 [IU] | Freq: Every day | INTRAMUSCULAR | Status: DC

## 2021-02-28 MED ORDER — TAMSULOSIN HCL 0.4 MG PO CAPS
0.4000 mg | ORAL_CAPSULE | Freq: Every day | ORAL | Status: DC
  Administered 2021-02-28 – 2021-03-04 (×5): 0.4 mg via ORAL
  Filled 2021-02-28 (×5): qty 1

## 2021-02-28 MED ORDER — ATORVASTATIN CALCIUM 20 MG PO TABS
80.0000 mg | ORAL_TABLET | Freq: Every day | ORAL | Status: DC
  Administered 2021-02-28 – 2021-03-04 (×5): 80 mg via ORAL
  Filled 2021-02-28 (×5): qty 4

## 2021-02-28 MED ORDER — FENTANYL CITRATE PF 50 MCG/ML IJ SOSY: 50.0000 ug | PREFILLED_SYRINGE | Freq: Once | INTRAMUSCULAR | Status: AC

## 2021-02-28 MED ORDER — FENTANYL CITRATE PF 50 MCG/ML IJ SOSY
PREFILLED_SYRINGE | INTRAMUSCULAR | Status: AC
  Administered 2021-02-28: 50 ug via INTRAVENOUS
  Filled 2021-02-28: qty 1

## 2021-02-28 MED ORDER — ALBUTEROL SULFATE (2.5 MG/3ML) 0.083% IN NEBU: 3.0000 mL | INHALATION_SOLUTION | Freq: Four times a day (QID) | RESPIRATORY_TRACT | Status: DC | PRN

## 2021-02-28 MED ORDER — DEXTROSE IN LACTATED RINGERS 5 % IV SOLN: INTRAVENOUS | Status: DC

## 2021-02-28 MED ORDER — SODIUM CHLORIDE 0.9% FLUSH
10.0000 mL | Freq: Two times a day (BID) | INTRAVENOUS | Status: DC
  Administered 2021-03-01 – 2021-03-03 (×4): 20 mL
  Administered 2021-03-03 – 2021-03-04 (×2): 10 mL

## 2021-02-28 MED ORDER — MIDAZOLAM HCL 2 MG/2ML IJ SOLN
INTRAMUSCULAR | Status: AC
  Administered 2021-02-28: 2 mg via INTRAVENOUS
  Filled 2021-02-28: qty 2

## 2021-02-28 MED ORDER — INSULIN REGULAR(HUMAN) IN NACL 100-0.9 UT/100ML-% IV SOLN
INTRAVENOUS | Status: DC
  Administered 2021-02-28: 10 [IU]/h via INTRAVENOUS
  Administered 2021-02-28: 2.6 [IU]/h via INTRAVENOUS
  Filled 2021-02-28 (×2): qty 100

## 2021-02-28 MED ORDER — MIDODRINE HCL 5 MG PO TABS
10.0000 mg | ORAL_TABLET | Freq: Three times a day (TID) | ORAL | Status: DC
  Administered 2021-02-28 – 2021-03-04 (×13): 10 mg via ORAL
  Filled 2021-02-28 (×15): qty 2

## 2021-02-28 MED ORDER — HEPARIN SODIUM (PORCINE) 5000 UNIT/ML IJ SOLN
5000.0000 [IU] | Freq: Three times a day (TID) | INTRAMUSCULAR | Status: DC
  Administered 2021-02-28 – 2021-03-04 (×10): 5000 [IU] via SUBCUTANEOUS
  Filled 2021-02-28 (×10): qty 1

## 2021-02-28 MED ORDER — VANCOMYCIN HCL IN DEXTROSE 1-5 GM/200ML-% IV SOLN
1000.0000 mg | Freq: Once | INTRAVENOUS | Status: AC
  Administered 2021-02-28: 1000 mg via INTRAVENOUS
  Filled 2021-02-28: qty 200

## 2021-02-28 MED ORDER — FENTANYL CITRATE PF 50 MCG/ML IJ SOSY
25.0000 ug | PREFILLED_SYRINGE | Freq: Once | INTRAMUSCULAR | Status: AC
Start: 2021-02-28 — End: 2021-02-28
  Administered 2021-02-28: 25 ug via INTRAVENOUS
  Filled 2021-02-28: qty 1

## 2021-02-28 MED ORDER — MIDAZOLAM HCL 2 MG/2ML IJ SOLN
1.0000 mg | Freq: Once | INTRAMUSCULAR | Status: AC
  Administered 2021-02-28: 1 mg via INTRAVENOUS

## 2021-02-28 MED ORDER — OMEGA-3-ACID ETHYL ESTERS 1 G PO CAPS
1000.0000 mg | ORAL_CAPSULE | Freq: Every day | ORAL | Status: DC
  Administered 2021-02-28 – 2021-03-04 (×5): 1000 mg via ORAL
  Filled 2021-02-28 (×5): qty 1

## 2021-02-28 MED ORDER — FLUTICASONE PROPIONATE 50 MCG/ACT NA SUSP
2.0000 | Freq: Every day | NASAL | Status: DC | PRN
  Filled 2021-02-28: qty 16

## 2021-02-28 NOTE — ED Provider Notes (Signed)
[facility]  ____________________________________________   Event Date/Time   First MD Initiated Contact with Patient 02/28/21 [phone]     (approximate)  I have reviewed the triage vital signs and the nursing notes.   HISTORY  Chief Complaint Fall and Hyperglycemia    HPI Reshawn Ghattas. is a 80 y.o. male with past medical history of diabetes, GERD, hypertension who presents after 2 falls.  Patient is a poor historian, cannot tell me why he fell but he denies any chest, abdominal pain or dyspnea.  He denies any extremity pain or headache.  Per EMS the patient had a fall earlier today and was seen by EMS but did not want to come to the hospital.  He then had another fall and when they arrived he was on the floor.  They noted that when they tried to stand him up he was very unsteady and almost passed out.         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[place]) 02/04/2021   Weight loss, unintentional 02/04/2021   Uncontrolled type 2 diabetes mellitus with hyperosmolar nonketotic hyperglycemia ([place]) 02/04/2021   Essential hypertension 02/04/2021   Chronic diastolic CHF (congestive heart failure) ([place]) 02/04/2021   Syncope 02/04/2021   Emphysema lung ([place]) 10/26/2020   History of 2019 novel coronavirus disease (COVID-19) 10/26/2020   Interstitial lung disease ([place]) 08/24/2020   Chronic respiratory failure with hypoxia ([place]) 06/20/2020   Atherosclerosis of aorta ([place]) 10/12/2019   Long-term insulin use ([place]) 10/12/2019   Vitamin B12 deficiency 06/28/2018   Prostate cancer ([place]) 06/27/2018   Psoriasis 08/16/2017   Advanced care planning/counseling  discussion 12/01/2016   GERD without esophagitis 08/14/2015   Uncontrolled type 2 diabetes mellitus with chronic kidney disease ([place]) 08/13/2015   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[place]) 08/13/2015   Chronic kidney disease, stage 3a ([place]) 02/11/2015   Mixed hyperlipidemia due to type 2 diabetes mellitus ([place]) 02/11/2015   BPH (benign prostatic hyperplasia) 02/11/2015    Past Surgical History:  Procedure Laterality Date   APPENDECTOMY     CARPAL TUNNEL RELEASE Left 12/11/2015   Procedure: CARPAL TUNNEL RELEASE;  Surgeon: Earnestine Leys, MD;  Location: [facility] ORS;  Service: Orthopedics;  Laterality: Left;   EYE SURGERY     laser   TRANSURETHRAL RESECTION OF PROSTATE N/A 08/02/2017   Procedure: TRANSURETHRAL RESECTION OF THE PROSTATE (TURP);  Surgeon: Hollice Espy, MD;  Location: [facility] ORS;  Service: Urology;  Laterality: N/A;    Prior to Admission medications   Medication Sig Start Date End Date Taking? Authorizing Provider  albuterol (VENTOLIN HFA) 108 (90 Base) MCG/ACT inhaler Inhale 2 puffs into the lungs every 6 (six) hours as needed for wheezing or shortness of breath. 02/04/21   Marnee Guarneri T, NP  aspirin EC 81 MG tablet Take 81 mg by mouth daily. Swallow whole.    [provider]  atorvastatin (LIPITOR) 80 MG tablet TAKE 1 TABLET EVERY DAY 12/26/20   Cannady, Jolene T, NP  fluticasone (FLONASE) 50 MCG/ACT nasal spray Place 2 sprays into both nostrils daily as needed.  [provider]  insulin glargine (LANTUS) 100 UNIT/ML injection Inject 0.35 mLs (35 Units total) into the skin every evening. 1800 02/04/21   Cannady, Henrine Screws T, NP  insulin lispro (HUMALOG KWIKPEN) 100 UNIT/ML KwikPen Inject 15 Units into the skin 3 (three) times daily before meals. Continue to check blood sugar after meals. 02/05/21   Sharen Hones, MD  lisinopril (ZESTRIL) 5 MG tablet Take 5 mg by mouth daily.    [provider]  metFORMIN (GLUCOPHAGE) 500  MG tablet Take 2 tablets (1,000 mg total) by mouth 2 (two) times daily with a meal. '1000mg'$  in the morning '1000mg'$  at night 05/17/20   Cannady, Jolene T, NP  Omega-3 Fatty Acids (OMEGA 3 500 PO) Take 500 mg by mouth daily.    [provider]  omeprazole (PRILOSEC) 20 MG capsule Take 1 capsule (20 mg total) by mouth daily. 01/14/21   Cannady, Henrine Screws T, NP  predniSONE (DELTASONE) 20 MG tablet Take 1 tablet by mouth daily. 08/27/20   [provider]  tamsulosin (FLOMAX) 0.4 MG CAPS capsule TAKE 1 CAPSULE EVERY DAY 07/31/20   Cannady, Henrine Screws T, NP  TRUE METRIX BLOOD GLUCOSE TEST test strip CHECK BLOOD SUGAR TWICE DAILY 11/01/20   Cannady, Henrine Screws T, NP  TRUEplus Lancets 33G MISC CHECK BLOOD SUGAR TWICE DAILY 10/18/20   Marnee Guarneri T, NP    Allergies Gabapentin  Family History  Problem Relation Age of Onset   Cancer Mother        lung   Heart disease Father    Diabetes Brother    Prostate cancer Neg Hx    Bladder Cancer Neg Hx    Kidney cancer Neg Hx     Social History Social History   Tobacco Use   Smoking status: Never   Smokeless tobacco: Never  Vaping Use   Vaping Use: Never used  Substance Use Topics   Alcohol use: No    Alcohol/week: 0.0 standard drinks   Drug use: No    Review of Systems   Review of Systems  Constitutional:  Negative for chills and fever.  Respiratory:  Negative for shortness of breath.   Cardiovascular:  Negative for chest pain.  Gastrointestinal:  Negative for abdominal pain, nausea and vomiting.  Neurological:  Negative for light-headedness.  All other systems reviewed and are negative.  Physical Exam Updated Vital Signs BP (!) 90/55   Pulse 77   Temp 97.6 F (36.4 C) (Oral)   Resp 19   Ht '5\' 9"'$  (1.753 m)   Wt 78 kg   SpO2 100%   BMI 25.39 kg/m   Physical Exam Vitals and nursing note reviewed.  Constitutional:      General: He is not in acute distress.    Appearance: Normal appearance.     Comments: Chronically  ill-appearing  HENT:     Head: Normocephalic.     Comments: 2 small abrasions on the forehead Eyes:     General: No scleral icterus.    Conjunctiva/sclera: Conjunctivae normal.  Cardiovascular:     Rate and Rhythm: Normal rate and regular rhythm.  Pulmonary:     Effort: Pulmonary effort is normal. No respiratory distress.     Breath sounds: Normal breath sounds. No wheezing.  Abdominal:     General: There is no distension.     Palpations: Abdomen is soft.     Tenderness: There is no abdominal tenderness. There is no guarding.  Musculoskeletal:        General: No  deformity or signs of injury.     Cervical back: Normal range of motion.  Skin:    Coloration: Skin is not jaundiced or pale.  Neurological:     General: No focal deficit present.     Mental Status: He is alert and oriented to person, place, and time. Mental status is at baseline.     Comments: Patient's eyes are closed but he opens them to voice, he is alert and oriented x3, he is somewhat inappropriate, answers questions in one-word answers  Psychiatric:        Mood and Affect: Mood normal.        Behavior: Behavior normal.     LABS (all labs ordered are listed, but only abnormal results are displayed)  Labs Reviewed  CBC WITH DIFFERENTIAL/PLATELET - Abnormal; Notable for the following components:      Result Value   Monocytes Absolute 1.1 (*)    All other components within normal limits  COMPREHENSIVE METABOLIC PANEL - Abnormal; Notable for the following components:   Sodium 125 (*)    Chloride 87 (*)    Glucose, Bld 755 (*)    BUN 29 (*)    Creatinine, Ser 2.13 (*)    Total Bilirubin 1.5 (*)    GFR, Estimated 31 (*)    All other components within normal limits  CBG MONITORING, ED - Abnormal; Notable for the following components:   Glucose-Capillary >600 (*)    All other components within normal limits  TROPONIN I (HIGH SENSITIVITY) - Abnormal; Notable for the following components:   Troponin I (High  Sensitivity) 39 (*)    All other components within normal limits  RESP PANEL BY RT-PCR (FLU A&B, COVID) ARPGX2  CULTURE, BLOOD (ROUTINE X 2)  CULTURE, BLOOD (ROUTINE X 2)  LACTIC ACID, PLASMA  LACTIC ACID, PLASMA  URINALYSIS, COMPLETE (UACMP) WITH MICROSCOPIC  TROPONIN I (HIGH SENSITIVITY)   ____________________________________________  EKG  Left axis deviation, sinus rhythm with nonspecific interventricular conduction delay, ST depression in V5 and V6, lead II ____________________________________________  RADIOLOGY I, Madelin Headings, personally viewed and evaluated these images (plain radiographs) as part of my medical decision making, as well as reviewing the written report by the radiologist.  ED MD interpretation: I reviewed the x-ray of the chest which shows significant rotation with chronic changes   ____________________________________________   PROCEDURES  Procedure(s) performed (including Critical Care):  Procedures   ____________________________________________   INITIAL IMPRESSION / ASSESSMENT AND PLAN / ED COURSE     80 year old male presents after 2 falls today.  His initial blood sugars greater than 600, apparently he has been out of his insulin for 4 days for insurance reasons.  Patient is really unable to provide much history at all but denies any complaints at this time.  Blood pressures on the softer side, vital signs otherwise notable for a sat of 90% on room air.  Patient looks somewhat dry and is mildly tachypneic but not in any distress.  He has an abrasion on his forehead no other signs of trauma.  Did not attempt to stand the patient but per EMS he was presyncopal.  He is somewhat altered and does not respond to questions appropriately but is oriented.  We will get a CT head and C-spine given the trauma.  Will give fluids for his hyperglycemia and rule out DKA.  Suspect there is a component of HHS regardless of his acid-base status.  Will likely need  admission.  Patient received 1 L  fluids, blood pressure was noted to be low 50s over 40s.  He was placed in Trendelenburg and another liter of fluid started.  After 2 L blood pressure is 90/55.  His sugar 755 he has an AKI with creatinine doubled.  Suspect he is very volume depleted from his hyperglycemia.  We will hang another liter of LR.  After the third liter will assess the need for pressors.  He has no infectious symptoms, no leukocytosis abdomen benign chest x-ray without signs of pneumonia.  Urine is still pending as is his lactate.  Blood culture sent.  If lactate significant elevated will cover empirically with antibiotics.  At the time of signout patient is pending reassessment and CT imaging.   Clinical Course as of 02/28/21 0734  Fri Feb 28, 2021  0458 Glucose-Capillary(!!): >600 [KM]    Clinical Course User Index [KM] Rada Hay, MD     ____________________________________________   FINAL CLINICAL IMPRESSION(S) / ED DIAGNOSES  Final diagnoses:  Hyperglycemia  AKI (acute kidney injury) [facility])     ED Discharge Orders     None        Note:  This document was prepared using Dragon voice recognition software and may include unintentional dictation errors.    Rada Hay, MD 02/28/21 [phone]

## 2021-02-28 NOTE — Procedures (Signed)
Central Venous Catheter Insertion Procedure Note  Scott Lawson  [id number]  1940/11/10  Date:02/28/21  Time:3:25 PM   Provider Performing:[name]   Procedure: Insertion of Non-tunneled Central Venous [phone]) with US guidance [id number])   Indication(s) Medication administration and Difficult access  Consent Risks of the procedure as well as the alternatives and risks of each were explained to the patient and/or caregiver.  Consent for the procedure was obtained and is signed in the bedside chart  Anesthesia Topical only with 1% lidocaine   Timeout Verified patient identification, verified procedure, site/side was marked, verified correct patient position, special equipment/implants available, medications/allergies/relevant history reviewed, required imaging and test results available.  Sterile Technique Maximal sterile technique including full sterile barrier drape, hand hygiene, sterile gown, sterile gloves, mask, hair covering, sterile ultrasound probe cover (if used).  Procedure Description Area of catheter insertion was cleaned with chlorhexidine and draped in sterile fashion.  With real-time ultrasound guidance a central venous catheter was placed into the right femoral vein. Nonpulsatile blood flow and easy flushing noted in all ports.  The catheter was sutured in place and sterile dressing applied.  Complications/Tolerance None; patient tolerated the procedure well. Chest X-ray is ordered to verify placement for internal jugular or subclavian cannulation.   Chest x-ray is not ordered for femoral cannulation.  EBL Minimal  Specimen(s) None   BIOPATCH applied to the insertion site.     Scott Lawson, AGACNP-BC [facility] Prefer epic messenger for cross cover needs If after hours, please call E-link

## 2021-02-28 NOTE — Progress Notes (Signed)
Peripherally Inserted Central Catheter Placement  The IV Nurse has discussed with the patient and/or persons authorized to consent for the patient, the purpose of this procedure and the potential benefits and risks involved with this procedure.  The benefits include less needle sticks, lab draws from the catheter, and the patient may be discharged home with the catheter. Risks include, but not limited to, infection, bleeding, blood clot (thrombus formation), and puncture of an artery; nerve damage and irregular heartbeat and possibility to perform a PICC exchange if needed/ordered by physician.  Alternatives to this procedure were also discussed.  Bard Power PICC patient education guide, fact sheet on infection prevention and patient information card has been provided to patient /or left at bedside.    PICC Placement Documentation  PICC Triple Lumen [id number] PICC Right Basilic 37 cm 1 cm (Active)  Indication for Insertion or Continuance of Line Vasoactive infusions 02/28/21 2316  Exposed Catheter (cm) 1 cm 02/28/21 2316  Site Assessment Clean;Dry;Intact 02/28/21 2316  Lumen #1 Status Flushed;Blood return noted;Saline locked 02/28/21 2316  Lumen #2 Status Flushed;Blood return noted;Saline locked 02/28/21 2316  Lumen #3 Status Flushed;Blood return noted;Saline locked 02/28/21 2316  Dressing Type Transparent 02/28/21 2316  Dressing Status Clean;Dry;Intact 02/28/21 2316  Antimicrobial disc in place? Yes 02/28/21 2316  Safety Lock Not Applicable [id number]  Line Care Connections checked and tightened 02/28/21 2316  Dressing Intervention New dressing 02/28/21 2316  Dressing Change Due 03/07/21 02/28/21 2316       Jaionna Weisse, Cathlyn Parsons 02/28/2021, 11:19 PM

## 2021-02-28 NOTE — Consult Note (Signed)
Consultation Note Date: 02/28/2021   Patient Name: Scott Lawson.  DOB: 11-20-1940  MRN: [id number]  Age / Sex: 80 y.o., male  PCP: Venita Lick, NP Referring Physician: Leslee Home, DO  Reason for Consultation: Establishing goals of care  HPI/Patient Profile: 80 y.o. male  with past medical history of insulin-dependent diabetes mellitus type 2, hypertension, dyslipidemia, BPH, GERD, and covid pna in Jan 2022 requiring hospitalization admitted on 02/28/2021 after 2 falls at home. Found to have hyperosmolar hyperglycemia and required insulin infusion. Also with low BP after multiple fluid boluses requiring vasopressors. Found to have elevated lactic acid and broad spectrum antibiotics started. Also with AKI. PMT consulted to discuss [place] d/t decline since Covid in Jan 2022.   Clinical Assessment and Goals of Care: I have reviewed medical records including EPIC notes, labs and imaging, received report from RN at bedside, assessed the patient and then met with patient  to discuss diagnosis prognosis, GOC, EOL wishes, disposition and options.  At bedside is patient's great granddaughter Scott Lawson [phone])  I introduced Palliative Medicine as specialized medical care for people living with serious illness. It focuses on providing relief from the symptoms and stress of a serious illness. The goal is to improve quality of life for both the patient and the family.  Hailey tells me patient has been living with her mother - patient's granddaughter - Scott Lawson - for several months d/t falls at home. Hailey tells me patient is usually oriented and able to care for himself - just falls a lot. He wants to return to his own home and live alone but family does not feel this is safe.   Patient and Scott Lawson both share that he has maintained a good appetite.    We discussed patient's current illness and what it means in the larger context of patient's  on-going co-morbidities. We discuss his hyperglycemia and hypovolemia. We discuss his need for vasopressors d/t hypotension. We discuss initiation of antibiotics d/t concern of infection. We discuss decline since covid infection and hospitalization in January.   I attempted to elicit values and goals of care important to the patient.  Patient tells me he just wants to return home and he intends to walk out of this hospital.   I ask him about who he would want to make medical decisions for him if he were unable - he shares it would be his granddaughter Scott Lawson. We discuss that his technical next of kin in daughters [name] and Vaughan Basta - he shares that they are not involved in his life and he does not want them making medical decisions for him.   We discuss code status. I share with him that during his last conversation with palliative team he requested full code/full scope interventions. We discuss that he has had some decline since that conversation and I ask if his wishes have changed. He tells me "do whatever you think is best". I tell him that my job is to help determine what his goals are and make recommendations in line with his goals. I share with that I do not think he would benefit from full code interventions - it is unlikely for him to return to baseline or what he would consider good quality of life if he were to suffer an arrest and require resuscitation. He does not respond to this and changes the subject, again tells me he intends to walk out of the hospital.   He tells me his main concern is that  someone let him eat. We discuss difficulties of this while he is on insulin infusion. He is quite frustrated by this.   Discussed with patient the importance of continued conversation with family and the medical providers regarding overall plan of care and treatment options, ensuring decisions are within the context of the patient's values and GOCs.    Questions and concerns were addressed. The  family was encouraged to call with questions or concerns.   Attempted to call patient's granddaughter, Scott Lawson, who he tells me he would like to make medical decisions for him if he were unable - no answer and unable to leave voicemail.   Primary Decision Maker PATIENT? Though patient is oriented difficult to say if he has capacity to make medical decisions for himself as he does not answer many of my questions appropriately and I am unsure if this is d/t AMS or just avoiding difficult topics  - Patient and great granddaughter at bedside have requested that patient's granddaughter Scott Lawson be medical decision maker if patient is unable - will need to get HCPOA completed but will not order today as it is unlikely to be completed on Friday evening and with some concern about mental status doubt chaplains will feel comfortable completing today - will ask team member to follow up on this next week  SUMMARY OF RECOMMENDATIONS   - see above regarding healthcare decision maker - will need spiritual care order for HCPOA completion once mental status clears more - patient does not directly respond to questions regarding code status, family at bedside shares he has not discussed wishes with them before, when PMT saw him in Jan 2022 he requested full code - will leave as full code for now - patient had been referred to outpatient palliative prior to admission - I have alerted outpatient palliative team to his current admission  Code Status/Advance Care Planning: Full code   Symptom Management:  Patient has no symptom complaints - just hungry and currently NPO d/t insulin infusion and high CBG  Discharge Planning: TBD      Primary Diagnoses: Present on Admission: . Hyperosmolar hyperglycemic state (HHS) ([employer])   I have reviewed the medical record, interviewed the patient and family, and examined the patient. The following aspects are pertinent.  Past Medical History:  Diagnosis Date  . BPH  (benign prostatic hyperplasia)   . Diabetes mellitus without complication ([place])   . Elevated PSA   . GERD (gastroesophageal reflux disease)   . Hypertension   . Hypertriglyceridemia   . Nocturia    Social History   Socioeconomic History  . Marital status: Married    Spouse name: Not on file  . Number of children: Not on file  . Years of education: Not on file  . Highest education level: High school graduate  Occupational History  . Occupation: retired  Tobacco Use  . Smoking status: Never  . Smokeless tobacco: Never  Vaping Use  . Vaping Use: Never used  Substance and Sexual Activity  . Alcohol use: No    Alcohol/week: 0.0 standard drinks  . Drug use: No  . Sexual activity: Yes  Other Topics Concern  . Not on file  Social History Narrative  . Not on file   Social Determinants of Health   Financial Resource Strain: Not on file  Food Insecurity: Not on file  Transportation Needs: Not on file  Physical Activity: Not on file  Stress: Not on file  Social Connections: Not on file   Family  History  Problem Relation Age of Onset  . Cancer Mother        lung  . Heart disease Father   . Diabetes Brother   . Prostate cancer Neg Hx   . Bladder Cancer Neg Hx   . Kidney cancer Neg Hx    Scheduled Meds: . aspirin EC  81 mg Oral Daily  . atorvastatin  80 mg Oral Daily  . heparin  5,000 Units Subcutaneous Q8H  . midodrine  10 mg Oral TID WC  . omega-3 acid ethyl esters  1,000 mg Oral Daily  . pantoprazole  40 mg Oral Daily  . tamsulosin  0.4 mg Oral Daily   Continuous Infusions: . dextrose 5% lactated ringers Stopped (02/28/21 0842)  . insulin 10.5 Units/hr (02/28/21 1300)  . lactated ringers 125 mL/hr at 02/28/21 1150  . norepinephrine (LEVOPHED) Adult infusion 4 mcg/min (02/28/21 1301)  . vancomycin 1,000 mg (02/28/21 1314)   Followed by  . vancomycin 750 mg (02/28/21 1314)   PRN Meds:.albuterol, dextrose, fluticasone Allergies  Allergen Reactions  .  Gabapentin Other (See Comments)    "made me fall out"   Review of Systems  Constitutional:  Negative for activity change, appetite change and fatigue.  Respiratory:  Negative for shortness of breath.   Gastrointestinal:  Negative for nausea.   Physical Exam Constitutional:      General: He is not in acute distress. Pulmonary:     Effort: Pulmonary effort is normal.  Skin:    General: Skin is warm and dry.  Neurological:     Mental Status: He is alert and oriented to person, place, and time.     Comments: Oriented though does not answer questions appropriately, easily confused and distracted    Vital Signs: BP (!) 84/46   Pulse 86   Temp 97.6 F (36.4 C) (Oral)   Resp 14   Ht 5' 9"  (1.753 m)   Wt 78 kg   SpO2 98%   BMI 25.39 kg/m          SpO2: SpO2: 98 % O2 Device:SpO2: 98 % O2 Flow Rate: .O2 Flow Rate (L/min): 2 L/min  IO: Intake/output summary:  Intake/Output Summary (Last 24 hours) at 02/28/2021 1334 Last data filed at 02/28/2021 1301 Gross per 24 hour  Intake 3497.23 ml  Output --  Net 3497.23 ml    LBM:   Baseline Weight: Weight: 78 kg Most recent weight: Weight: 78 kg     Palliative Assessment/Data: PPS 50%    Time Total: 65 minutes Greater than 50%  of this time was spent counseling and coordinating care related to the above assessment and plan.  Juel Burrow, DNP, AGNP-C Palliative Medicine Team [phone] Pager: [phone]

## 2021-02-28 NOTE — ED Notes (Signed)
Pt transported to CT ?

## 2021-02-28 NOTE — Progress Notes (Signed)
Was asked by to Hospitalist to place central line due to vasopressors, multiple IV infusions, and frequent lab draws.  Pt gives consent to central line placement.  Upon assessment of internal jugular veins, they are collapsing immediately with respirations, and pt somewhat encephalopathic and will not remain still.  Therefore will place line in femoral vein with light sedation.   Attempt made to place central line in left femoral vein.  Able to cannulate vessel easily.  Guidewire initially advancing easily, however when about half the length of the guidewire was in, then met resistance.  Able to thread through the resistance, but when attempt made to thread the catheter itself, would not thread.  Attempt tried one more time with same results.    Attempt was aborted at that time.  Pressure held at the site.  No signs of bleeding or hematoma.  Continue to monitor.  Will assess other groin site for line placement.       [name], AGACNP-BC  Pulmonary & Critical Care Prefer epic messenger for cross cover needs If after hours, please call E-link

## 2021-02-28 NOTE — ED Notes (Signed)
Levo drip to proximal; vanc, insulin, LR to medial on central line

## 2021-02-28 NOTE — H&P (Signed)
History and Physical    Scott Lawson. [id number] DOB: 08-Apr-1941 DOA: 02/28/2021  PCP: Venita Lick, NP  Chief Complaint: Falls  HPI: Scott Lawson. is a 80 y.o. male with a past medical history of insulin-dependent diabetes mellitus type 2, hypertension, dyslipidemia, BPH, GERD.  This patient presents to the emergency department after 2 falls at home.  EMS was called after his first fall but he did not want to go to the hospital.  After a second fall he was found down on the ground at home.  EMS reported presyncope.  Poor historian.  Denies any chest pain, fevers or chills.  Denies any urinary complaints.  Denies any abdominal complaints.  Denies any cough or upper respiratory symptoms.  He states he is compliant with his insulin.  Takes 35 units of Lantus every evening he also states he takes 15 units of Humalog twice a day.  He also states he takes 5 units of "another type of insulin" that he cannot recall the name of.  Denies running out of insulin at home.  ED Course: Due to his fall CT imaging was obtained CT head without contrast was negative for any acute findings.  CT C-spine without contrast was negative for any acute findings.  Blood cultures are negative to date.  Blood work shows pseudohyponatremia, blood glucose significantly elevated at 755 and creatinine elevated at 2.1.  No anion gap. Troponin mildly elevated at 33 with no chest pain.  Lactic acid elevated at 2.4 with no signs of acute infection/sepsis.  Has been given 3 L of lactated Ringer's and started on an insulin drip.  Maintenance IV fluids are now running with lactated Ringer's at 125 cc an hour.  Hypovolemic shock is improving.  Review of Systems: 14 point review of systems is negative except for what is mentioned above in the HPI.   Past Medical History:  Diagnosis Date   BPH (benign prostatic hyperplasia)    Diabetes mellitus without complication (HCC)    Elevated PSA    GERD  (gastroesophageal reflux disease)    Hypertension    Hypertriglyceridemia    Nocturia     Past Surgical History:  Procedure Laterality Date   APPENDECTOMY     CARPAL TUNNEL RELEASE Left 12/11/2015   Procedure: CARPAL TUNNEL RELEASE;  Surgeon: Earnestine Leys, MD;  Location: [facility] ORS;  Service: Orthopedics;  Laterality: Left;   EYE SURGERY     laser   TRANSURETHRAL RESECTION OF PROSTATE N/A 08/02/2017   Procedure: TRANSURETHRAL RESECTION OF THE PROSTATE (TURP);  Surgeon: Hollice Espy, MD;  Location: [facility] ORS;  Service: Urology;  Laterality: N/A;    Social History   Socioeconomic History   Marital status: Married    Spouse name: Not on file   Number of children: Not on file   Years of education: Not on file   Highest education level: High school graduate  Occupational History   Occupation: retired  Tobacco Use   Smoking status: Never   Smokeless tobacco: Never  Vaping Use   Vaping Use: Never used  Substance and Sexual Activity   Alcohol use: No    Alcohol/week: 0.0 standard drinks   Drug use: No   Sexual activity: Yes  Other Topics Concern   Not on file  Social History Narrative   Not on file   Social Determinants of Health   Financial Resource Strain: Not on file  Food Insecurity: Not on file  Transportation Needs: Not on file  Physical Activity: Not on file  Stress: Not on file  Social Connections: Not on file  Intimate Partner Violence: Not on file    Allergies  Allergen Reactions   Gabapentin Other (See Comments)    "made me fall out"    Family History  Problem Relation Age of Onset   Cancer Mother        lung   Heart disease Father    Diabetes Brother    Prostate cancer Neg Hx    Bladder Cancer Neg Hx    Kidney cancer Neg Hx     Prior to Admission medications   Medication Sig Start Date End Date Taking? Authorizing Provider  albuterol (VENTOLIN HFA) 108 (90 Base) MCG/ACT inhaler Inhale 2 puffs into the lungs every 6 (six) hours as needed for  wheezing or shortness of breath. 02/04/21  Yes Cannady, Jolene T, NP  aspirin EC 81 MG tablet Take 81 mg by mouth daily. Swallow whole.   Yes [provider]  atorvastatin (LIPITOR) 80 MG tablet TAKE 1 TABLET EVERY DAY 12/26/20  Yes Cannady, Jolene T, NP  fluticasone (FLONASE) 50 MCG/ACT nasal spray Place 2 sprays into both nostrils daily as needed.    Yes [provider]  insulin glargine (LANTUS) 100 UNIT/ML injection Inject 0.35 mLs (35 Units total) into the skin every evening. 1800 02/04/21  Yes Cannady, Jolene T, NP  insulin lispro (HUMALOG KWIKPEN) 100 UNIT/ML KwikPen Inject 15 Units into the skin 3 (three) times daily before meals. Continue to check blood sugar after meals. Patient taking differently: Inject 15 Units into the skin in the morning and at bedtime. Continue to check blood sugar after meals. 02/05/21  Yes Sharen Hones, MD  lisinopril (ZESTRIL) 5 MG tablet Take 5 mg by mouth daily.   Yes [provider]  metFORMIN (GLUCOPHAGE) 500 MG tablet Take 2 tablets (1,000 mg total) by mouth 2 (two) times daily with a meal. '1000mg'$  in the morning '1000mg'$  at night 05/17/20  Yes Cannady, Jolene T, NP  Omega-3 Fatty Acids (OMEGA 3 500 PO) Take 500 mg by mouth daily.   Yes [provider]  omeprazole (PRILOSEC) 20 MG capsule Take 1 capsule (20 mg total) by mouth daily. 01/14/21  Yes Cannady, Henrine Screws T, NP  tamsulosin (FLOMAX) 0.4 MG CAPS capsule TAKE 1 CAPSULE EVERY DAY 07/31/20  Yes Cannady, Henrine Screws T, NP  TRUE METRIX BLOOD GLUCOSE TEST test strip CHECK BLOOD SUGAR TWICE DAILY 11/01/20   Marnee Guarneri T, NP  TRUEplus Lancets 33G MISC CHECK BLOOD SUGAR TWICE DAILY 10/18/20   Marnee Guarneri T, NP    Physical Exam: Vitals:   02/28/21 0645 02/28/21 0700 02/28/21 0737 02/28/21 0830  BP: (!) 93/53 (!) 90/55 (!) 100/57 (!) 89/73  Pulse: 78 77 61 (!) 50  Resp: '17 19 18   '$ Temp:      TempSrc:      SpO2: 100% 100% 99% 97%  Weight:      Height:         General:  Appears  calm and comfortable and is in NAD Cardiovascular:  RRR, no m/r/g.  Respiratory:   CTA bilaterally with no wheezes/rales/rhonchi.  Normal respiratory effort. Abdomen:  soft, NT, ND, NABS Skin:  no rash or induration seen on limited exam Musculoskeletal:  grossly normal tone BUE/BLE, good ROM, no bony abnormality Lower extremity:  No LE edema.  Limited foot exam with no ulcerations.  2+ distal pulses. Psychiatric:  grossly normal mood and affect, speech fluent and  appropriate, AOx3 Neurologic:  CN 2-12 grossly intact, moves all extremities in coordinated fashion, sensation intact    Radiological Exams on Admission: Independently reviewed - see discussion in A/P where applicable  CT HEAD WO CONTRAST (5MM)  Result Date: 02/28/2021 CLINICAL DATA:  Fall.  Head trauma, minor (Age >= 65y) EXAM: CT HEAD WITHOUT CONTRAST TECHNIQUE: Contiguous axial images were obtained from the base of the skull through the vertex without intravenous contrast. COMPARISON:  02/04/2021 FINDINGS: Brain: There is atrophy and chronic small vessel disease changes. No acute intracranial abnormality. Specifically, no hemorrhage, hydrocephalus, mass lesion, acute infarction, or significant intracranial injury. Vascular: No hyperdense vessel or unexpected calcification. Skull: No acute calvarial abnormality. Sinuses/Orbits: No acute findings Other: None IMPRESSION: Atrophy, chronic microvascular disease. No acute intracranial abnormality. Electronically Signed   By: Rolm Baptise M.D.   On: 02/28/2021 08:13   CT Cervical Spine Wo Contrast  Result Date: 02/28/2021 CLINICAL DATA:  Fall.  Neck trauma (Age >= 65y) EXAM: CT CERVICAL SPINE WITHOUT CONTRAST TECHNIQUE: Multidetector CT imaging of the cervical spine was performed without intravenous contrast. Multiplanar CT image reconstructions were also generated. COMPARISON:  None. FINDINGS: Alignment: Normal Skull base and vertebrae: No acute fracture. No primary bone lesion or focal  pathologic process. Soft tissues and spinal canal: No prevertebral fluid or swelling. No visible canal hematoma. Disc levels: Diffuse degenerative disc disease, most advanced from C4-5 through C6-7. Moderate bilateral degenerative facet disease, left greater than right. Multilevel bilateral neural foraminal narrowing. Upper chest: No acute findings Other: None IMPRESSION: Degenerative disc and facet disease.  No acute bony abnormality. Electronically Signed   By: Rolm Baptise M.D.   On: 02/28/2021 08:14   DG Chest Portable 1 View  Result Date: 02/28/2021 CLINICAL DATA:  Fall. EXAM: PORTABLE CHEST 1 VIEW COMPARISON:  06/24/2020 FINDINGS: 0616 hours. Patient rotated to the right. The cardio pericardial silhouette is enlarged. Interstitial markings are diffusely coarsened with chronic features. Superimpose patchy airspace disease noted bilaterally, right greater than left. No substantial pleural effusion. The visualized bony structures of the thorax show no acute abnormality. Telemetry leads overlie the chest. IMPRESSION: Stable exam. Diffuse interstitial opacity with patchy bibasilar airspace disease, right greater than left. Electronically Signed   By: Misty Stanley M.D.   On: 02/28/2021 06:31    EKG: Independently reviewed.  Left axis deviation, sinus rhythm with nonspecific interventricular conduction delay, ST depression in V5 and V6, lead II.   Labs on Admission: I have personally reviewed the available labs and imaging studies at the time of the admission.  Pertinent labs: Sodium 125, chloride 87, BUN 29, creatinine 2.13, lactic acid 2.4, blood glucose 755, troponin 33.   Assessment/Plan: Hyperosmolar hyperglycemic state: This patient will be admitted to the stepdown unit under inpatient status.  Currently on insulin drip.  Follow HHS protocol.  Hypovolemic shock: Responded to fluid resuscitation and now shock resolved. Likely secondary to the above from osmotic diuresis.  No signs of acute  infection/sepsis.  Acute kidney injury secondary to above: Plan as above.  Initial serum creatinine 2.1.  Baseline creatinine appears to be around 1.1-1.4.  Poorly controlled insulin-dependent diabetes mellitus type 2: A1c 14.  Diabetes coordinator consulted.  Holding home Lantus 35 units nightly and Humalog 15 units twice a day with meals and metformin 1000 mg twice daily. States he is on another type of insulin that he takes 5 units BID. May benefit from home health services upon discharge for better diabetes control.  Hypertension: Home Zestril on  hold due to the acute kidney injury.  Dyslipidemia: Continue home Lipitor and omega-3 fatty acids  GERD: Continue home Prilosec  BPH: Continue home Flomax   Level of Care: Stepdown DVT prophylaxis: Heparin subcu Code Status: Full code Consults: None Admission status: Inpatient   Leslee Home DO [facility]   How to contact the [facility] Attending or Consulting provider [place] or covering provider during after hours [place], for this patient?  Check the care team in [facility] and look for a) attending/consulting [facility] provider listed and b) the [facility] team listed Log into [email or web address] and use [place]'s universal password to access. If you do not have the password, please contact the hospital operator. Locate the [facility] provider you are looking for under [facility] and page to a number that you can be directly reached. If you still have difficulty reaching the provider, please page the [facility] (Director on Call) for the Hospitalists listed on amion for assistance.   02/28/2021, 9:00 AM

## 2021-02-28 NOTE — Progress Notes (Signed)
Cross coverage Unwitnessed fall.  Patient found on his knees.  Physical exam reveals a skin tear to her right knee.  Has central line right femoral that had a Torn off during the fall.  Patient is awake and alert.  He is restless and is denying pain.  Denies headache denies pain in the neck.  No pain with passive neck movements no with palpation CT head and C-spine ordered and reviewed. No acute injury Fall precautions Sitter ordered

## 2021-02-28 NOTE — ED Notes (Signed)
NP at bedside with PA student placing central line

## 2021-02-28 NOTE — ED Notes (Signed)
Despite persistent hypotension, pt has remained A&Ox 4, jovial throughout.

## 2021-02-28 NOTE — ED Notes (Signed)
Port CXR performed 

## 2021-02-28 NOTE — ED Provider Notes (Signed)
-----------------------------------------   8:46 AM on 02/28/2021 -----------------------------------------  I took over care on this patient from Dr. Starleen Blue.  The blood pressure has significantly improved after 3 L of LR and the patient's map is over 65.  On reassessment he appears comfortable and denies any acute complaints.  The lactate is slightly elevated although this was drawn before most of the fluids.  Given the lack of any focal symptoms, fever, elevated WBC count, I do not suspect acute infection/sepsis, so I am not ordering empiric antibiotics at this time.  Overall presentation is most consistent with HHS and significant hypovolemia.  I ordered an insulin infusion.  I consulted Dr. Rowe Pavy from the hospitalist service for admission.   Arta Silence, MD 02/28/21 [phone]

## 2021-02-28 NOTE — ED Notes (Signed)
Called pt's grand-daughter, [name], and provided an update. Requested to have a call back later with another update, [facility] RN aware.

## 2021-02-28 NOTE — Progress Notes (Addendum)
Inpatient Diabetes Program Recommendations  AACE/ADA: New Consensus Statement on Inpatient Glycemic Control (2015)  Target Ranges:  Prepandial:   less than 140 mg/dL      Peak postprandial:   less than 180 mg/dL (1-2 hours)      Critically ill patients:  140 - 180 mg/dL   Lab Results  Component Value Date   GLUCAP 450 (H) 02/28/2021   HGBA1C >14.0 (H) 02/04/2021    Review of Glycemic Control  Diabetes history: DM 2 Outpatient Diabetes medications: Lantus 35 units, Humalog 15 units qam and qhs, Metformin 1000 mg bid Current orders for Inpatient glycemic control:  IV insulin/Endotool  A1c >14% on 8/23  Spoke with pt at bedside regarding A1c and glucose control at home. Pt reports checking glucose bid at home and usually sees 130-140 even in the afternoons. Pt reports taking medication all the time without missing doses. Pt also reports seeing his PCP every 3 months. Last time pt saw PCP no changes were made to insulin regimen. Pt reports he follows a DM diet.   Spoke with pt about his elevated glucose here in the hospital. I do not believe pt is telling me the truth or if he really believes his glucose is controlled at baseline. The glucose range on presentation is more than just an acute reading due to illness.  Spoke with pt about glucose and a1c goals and importance of glucose control.  At time of transition: -  Levemir 35 units -  Novolog 0-15 units tid + hs -  Novolog 5 units tid meal coverage if eating >50% of meals  Thanks,  Tama Headings RN, MSN, BC-ADM Inpatient Diabetes Coordinator Team Pager [phone] (8a-5p)

## 2021-02-28 NOTE — ED Notes (Signed)
Critical blood glucose--755. MD notified and aware.

## 2021-02-28 NOTE — Progress Notes (Signed)
[facility]) Hospital Liaison note:  This is a pending outpatient-based Palliative Care patient. Will continue to follow for disposition.  Please call with any outpatient palliative questions or concerns.  Thank you, Lorelee Market, LPN [facility] Liaison [phone]

## 2021-02-28 NOTE — Progress Notes (Signed)
Interim Progress Note:  Contacted by nurse that patient remains hypotensive with almost 4 L of IV fluids given.  He is asymptomatic and not encephalopathic. Start vasopressor support with norepinephrine.  Consult Critical Care team.  Obtain PICC line.  Lactate increasing to 3.3. Start empiric broad-spectrum antibiotics with vancomycin and cefepime. Obtain further imaging with CT chest without contrast.

## 2021-02-28 NOTE — ED Notes (Signed)
Informed RN bed assigned 

## 2021-02-28 NOTE — Consult Note (Signed)
PHARMACY CONSULT NOTE - FOLLOW UP  Pharmacy Consult for Electrolyte Monitoring and Replacement   Recent Labs: Potassium (mmol/L)  Date Value  02/28/2021 3.7  07/17/2014 4.2   Magnesium (mg/dL)  Date Value  07/19/2020 1.8  07/16/2014 1.9   Calcium (mg/dL)  Date Value  02/28/2021 8.8 (L)   Calcium, Total (mg/dL)  Date Value  07/17/2014 8.5   Albumin (g/dL)  Date Value  02/28/2021 3.8  02/04/2021 3.9   Phosphorus (mg/dL)  Date Value  07/19/2020 4.3   Sodium (mmol/L)  Date Value  02/28/2021 133 (L)  02/04/2021 129 (L)  07/17/2014 136    Assessment: 80YOM PMH T2DM on insulin at home, HTN, HLD, presenting to the ED s/p 2 falls at home. He met criteria for HHS, therefore he was given 4 L of Lactated Ringer's and was placed on an insulin drip. Patient initially poorly responsive to fluid bolusing, requiring initiation of Levophed gtt and transfer to ICU for further management.  Labs: K; 4.2 > 3.3 > 3.7 Na; 130 (corrected: 135; BG 408) >   Goal of Therapy:  K > 4 while on insulin gtt All others WNL  Plan:  K 3.7, transitioning off Insulin drip to SQ detemir All other Lytes WNL; no further repletion at this time.  Paulina Fusi, PharmD, BCPS 02/28/2021 10:07 PM

## 2021-02-28 NOTE — Consult Note (Signed)
PHARMACY CONSULT NOTE - FOLLOW UP  Pharmacy Consult for Electrolyte Monitoring and Replacement   Recent Labs: Potassium (mmol/L)  Date Value  02/28/2021 3.3 (L)  07/17/2014 4.2   Magnesium (mg/dL)  Date Value  07/19/2020 1.8  07/16/2014 1.9   Calcium (mg/dL)  Date Value  02/28/2021 8.9   Calcium, Total (mg/dL)  Date Value  07/17/2014 8.5   Albumin (g/dL)  Date Value  02/28/2021 3.8  02/04/2021 3.9   Phosphorus (mg/dL)  Date Value  07/19/2020 4.3   Sodium (mmol/L)  Date Value  02/28/2021 130 (L)  02/04/2021 129 (L)  07/17/2014 136    Assessment: 80YOM PMH T2DM on insulin at home, HTN, HLD, presenting to the ED s/p 2 falls at home. He met criteria for HHS, therefore he was given 4 L of Lactated Ringer's and was placed on an insulin drip. Patient initially poorly responsive to fluid bolusing, requiring initiation of Levophed gtt and transfer to ICU for further management.  Labs: K; 4.2 > 3.3 > Na; 130 (corrected: 135; BG 408) >   Goal of Therapy:  K > 4 while on insulin gtt All others WNL  Plan:  K 3.3 will replete with KCl IV 10 mEq Q1hour x 2 CTM K+ q4h while on insulin gtt; next level @~1600 All other Lytes WNL; no further repletion at this time.  Wynelle Cleveland, PharmD Pharmacy Resident  02/28/2021 4:16 PM

## 2021-02-28 NOTE — ED Triage Notes (Signed)
Pt to ED via EMS from home c/o falls x2 tonight and hyperglycemia and no insulin for 4 days d/t insurance reasons.  Pt A&O to self and place, disoriented to time and situation.  EMS states CBG read high which is above 500 on their meter, other vitals 97 HR, 98% RA, 155/86 BP.  Pt denies pain or SOB, chest rise even and unlabored, skin WNL, in NAD at this time.

## 2021-02-28 NOTE — Consult Note (Signed)
NAME:  Scott Wence., MRN:  [id number], DOB:  [birth year], LOS: 0 ADMISSION DATE:  02/28/2021, CONSULTATION DATE:  02/28/2021 REFERRING MD:  Dr. Rowe Pavy, CHIEF COMPLAINT:  Falls   Brief Pt Description / Synopsis:  80 y.o. Male admitted with HHS, multifactorial shock (hypovolemic vs. Septic), questionable pneumonia, and Acute Kidney Injury.  History of Present Illness:  Scott Lawson is a 80 year old male with a past medical history significant for insulin-dependent type 2 diabetes mellitus, hypertension, hyperlipidemia, BPH, and GERD who presented to [facility] ED on 02/28/2021 status post 2 falls at home.  EMS was called after his first fall, but he declined to go to the hospital.  After a second fall he was found on the ground by his family, therefore EMS was dispatched again.  He denies chest pain, lightheadedness, dizziness, palpitations, shortness of breath, cough, fever, chills, dysuria.  He also reports that he is compliant with his insulin and has not run out of his insulin at home, which he takes 35 units of Lantus every evening along with 15 units of Humalog twice a day.  ED COURSE: Initial vital signs: Temperature 97.6 F orally, respiratory rate 18, pulse 85, blood pressure 102/85, SPO2 93% on room air Significant labs: Sodium 125, chloride 87, glucose 755, BUN 29, creatinine 2.13, high-sensitivity troponin 39, lactic acid 2.4, WBC 10.5, serum osmolality 306 Urinalysis is negative for UTI, and negative for ketones COVID-19 PCR is negative Imaging: CT head without contrast>>Atrophy, chronic microvascular disease. No acute intracranial abnormality. CT cervical spine>>Degenerative disc and facet disease.  No acute bony abnormality. Chest x-ray>>Patient rotated to the right. The cardio pericardial silhouette is enlarged. Interstitial markings are diffusely coarsened with chronic features. Superimpose patchy airspace disease noted bilaterally, right greater than left. No substantial  pleural effusion. The visualized bony structures of the thorax show no acute abnormality. Telemetry leads overlie the chest. CT chest without contrast>>1. Marked interval decrease in the diffuse ground-glass opacity seen previously. 2. Areas of architectural distortion and scarring bilaterally with subpleural reticulation in basilar predominant traction bronchiectasis. Underlying component of pulmonary fibrosis suspected. High-resolution chest CT may prove helpful to further evaluate as clinically warranted. 3. Stable mild mediastinal lymphadenopathy, likely reactive given the 9 month interval stability. 4. 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 5. Aortic Atherosclerosis  He met criteria for HHS, therefore he was given 4 L of lactated Ringer's and was placed on an insulin drip.  Despite aggressive IV fluids he remained hypotensive with systolic blood pressures in the 80s to low 90s.  Given poor fluid responsiveness to IV fluids, he was placed on low-dose Levophed.  Concern is for possible developing septic shock due to potential pneumonia.  PCCM is consulted for further assistance with managing shock.  Pertinent  Medical History  Diabetes mellitus type 2 BPH GERD Hypertension Hyperlipidemia  Micro Data:  02/28/2021: SARS-CoV-2 and influenza PCR>> negative 02/28/2021: Blood culture>> 02/28/2021: Strep pneumo urinary antigen>> 02/28/2021: Legionella urinary antigen>> 02/28/2021: Mycoplasma pneumonia>>  Antimicrobials:  Cefepime 9/16>> Vancomycin 9/16>>  Significant Hospital Events: Including procedures, antibiotic start and stop dates in addition to other pertinent events   02/28/21: Admitted by Hospitalist for HHS.  Despite IVF is hypotensive, requiring low dose pressors ~ PCCM consulted. Right femoral CVC placed.  Interim History / Subjective:  -Presented to ED s/p multiple fall at home -To be admitted by Hospitalist for treatment of HHS -Hypotensive despite 4L IVF boluses with worsening  lactic acidosis -Starting on low dose Levophed ~ PCCM consulted -Chest X-ray concerning for  superimposed patchy airspace disease~ CT Chest pending -Concern for developing septic shock ~Cefepime and Vancomycin being initiated   Objective   Blood pressure (!) 85/57, pulse 88, temperature 97.6 F (36.4 C), temperature source Oral, resp. rate 18, height 5' 9"  (1.753 m), weight 78 kg, SpO2 100 %.        Intake/Output Summary (Last 24 hours) at 02/28/2021 1138 Last data filed at 02/28/2021 1127 Gross per 24 hour  Intake 3444.25 ml  Output --  Net 3444.25 ml   Filed Weights   02/28/21 0444  Weight: 78 kg    Examination: General: Acutely ill-appearing male, laying in bed, on 2 L nasal cannula, no acute distress HENT: Atraumatic, normocephalic, neck supple, no JVD Lungs: Clear to auscultation bilaterally, no wheezing or rales noted, even, nonlabored Cardiovascular: Regular rate and rhythm, S1-S2, no murmurs, rubs, gallops, 2+ radial pulses, 1+ dorsalis pedis pulses Abdomen: Soft, nontender, nondistended, no guarding or rebound tenderness, bowel sounds positive x4 Extremities: Normal bulk and tone, no deformities, no edema Neuro: Awake, alert and oriented x3, follows commands, no focal deficits, speech clear, pupils PERRLA GU: Deferred Skin: Limited exam ~warm and dry.  No obvious rashes, lesions, ulcerations  Resolved Hospital Problem list     Assessment & Plan:   Shock, suspect Hypovolemic due to [facility] +/-  Septic due to ? Pneumonia Mildly elevated Troponin, suspect Demand Ischemia PMHx of Hypertension, Hyperlipidemia -Continuous cardiac monitoring -Maintain MAP >65 -Aggressive IV fluids (received 4L IVF boluses in ED) -Vasopressors as needed to maintain MAP goal -Start Midodrine -Trend lactic acid until normalized -Trend HS Troponin until peaked (39 ~ 33) -Check Cortisol -Will start empiric ABX for possible sepsis and Pneumonia -Consider repeat Echocardiogram (Echocardiogram  on 02/05/21 was normal) -Hold home antihypertensives  Hyperosmolar Hyperglycemic State PMHx of Diabetes Mellitus Type II -Follow HHS protocol -Aggressive IV Fluids -Insulin gtt -Follow BMP q4h -Diabetes Coordinator consulted, appreciate input  Chest X-ray with questionable Pneumonia -Monitor fever curve -Trend WBC's & Procalcitonin -Follow cultures as above -Continue empiric Cefepime and Vancomycin pending cultures & sensitivities  Acute Kidney Injury Hypertonic Hyponatremia due to severely elevated glucose -Monitor I&O's / urinary output -Follow BMP -Ensure adequate renal perfusion -Avoid nephrotoxic agents as able -Replace electrolytes as indicated -IV fluids   Best Practice (right click and "Reselect all SmartList Selections" daily)   Diet/type: NPO DVT prophylaxis: prophylactic heparin  GI prophylaxis: PPI Lines: N/A Foley:  N/A Code Status:  full code Last date of multidisciplinary goals of care discussion [n/a]  Patient and his granddaughter updated at bedside 02/28/2021.  All questions answered.  Labs   CBC: Recent Labs  Lab 02/28/21 0452  WBC 10.5  NEUTROABS 5.4  HGB 15.2  HCT 44.3  MCV 89.7  PLT 885    Basic Metabolic Panel: Recent Labs  Lab 02/28/21 0452 02/28/21 0913  NA 125* 124*  K 4.1 4.8  CL 87* 89*  CO2 25 23  GLUCOSE 755* 666*  BUN 29* 25*  CREATININE 2.13* 1.77*  CALCIUM 9.4 8.5*   GFR: Estimated Creatinine Clearance: 33.8 mL/min (A) (by C-G formula based on SCr of 1.77 mg/dL (H)). Recent Labs  Lab 02/28/21 0452 02/28/21 0645 02/28/21 1016  WBC 10.5  --   --   LATICACIDVEN  --  2.4* 3.3*    Liver Function Tests: Recent Labs  Lab 02/28/21 0452  AST 15  ALT 11  ALKPHOS 98  BILITOT 1.5*  PROT 6.9  ALBUMIN 3.8   No results for input(s): LIPASE, AMYLASE  in the last 168 hours. No results for input(s): AMMONIA in the last 168 hours.  ABG    Component Value Date/Time   PHART 7.46 (H) 06/23/2020 0906   PCO2ART 39  06/23/2020 0906   PO2ART 59 (L) 06/23/2020 0906   HCO3 25.8 02/04/2021 1701   ACIDBASEDEF 1.0 02/04/2021 1701   O2SAT 57.5 02/04/2021 1701     Coagulation Profile: No results for input(s): INR, PROTIME in the last 168 hours.  Cardiac Enzymes: No results for input(s): CKTOTAL, CKMB, CKMBINDEX, TROPONINI in the last 168 hours.  HbA1C: Hemoglobin A1C  Date/Time Value Ref Range Status  03/03/2016 12:00 AM 7.1%  Final  07/16/2014 04:40 AM 9.7 (H) 4.2 - 6.3 % Final    Comment:    The American Diabetes Association recommends that a primary goal of therapy should be <7% and that physicians should reevaluate the treatment regimen in patients with HbA1c values consistently >8%.    HB A1C (BAYER DCA - WAIVED)  Date/Time Value Ref Range Status  02/04/2021 03:11 PM >14.0 (H) <7.0 % Final    Comment:                                          Diabetic Adult            <7.0                                       Healthy Adult        4.3 - 5.7                                                           (DCCT/NGSP) American Diabetes Association's Summary of Glycemic Recommendations for Adults with Diabetes: Hemoglobin A1c <7.0%. More stringent glycemic goals (A1c <6.0%) may further reduce complications at the cost of increased risk of hypoglycemia.   11/26/2020 11:49 AM 13.9 (H) <7.0 % Final    Comment:                                          Diabetic Adult            <7.0                                       Healthy Adult        4.3 - 5.7                                                           (DCCT/NGSP) American Diabetes Association's Summary of Glycemic Recommendations for Adults with Diabetes: Hemoglobin A1c <7.0%. More stringent glycemic goals (A1c <6.0%) may further reduce complications at the cost of increased risk of hypoglycemia.     CBG: Recent Labs  Lab 02/28/21 0452 02/28/21 0910 02/28/21 0952 02/28/21 1018 02/28/21 1109  GLUCAP >600* >600* >600* >600* 504*     Review of Systems:   Positives in BOLD: Denies all complaints Gen: Denies fever, chills, weight change, fatigue, night sweats HEENT: Denies blurred vision, double vision, hearing loss, tinnitus, sinus congestion, rhinorrhea, sore throat, neck stiffness, dysphagia PULM: Denies shortness of breath, cough, sputum production, hemoptysis, wheezing CV: Denies chest pain, edema, orthopnea, paroxysmal nocturnal dyspnea, palpitations GI: Denies abdominal pain, nausea, vomiting, diarrhea, hematochezia, melena, constipation, change in bowel habits GU: Denies dysuria, hematuria, polyuria, oliguria, urethral discharge Endocrine: Denies hot or cold intolerance, polyuria, polyphagia or appetite change Derm: Denies rash, dry skin, scaling or peeling skin change Heme: Denies easy bruising, bleeding, bleeding gums Neuro: Denies headache, numbness, weakness, slurred speech, loss of memory or consciousness   Past Medical History:  He,  has a past medical history of BPH (benign prostatic hyperplasia), Diabetes mellitus without complication (HCC), Elevated PSA, GERD (gastroesophageal reflux disease), Hypertension, Hypertriglyceridemia, and Nocturia.   Surgical History:   Past Surgical History:  Procedure Laterality Date   APPENDECTOMY     CARPAL TUNNEL RELEASE Left 12/11/2015   Procedure: CARPAL TUNNEL RELEASE;  Surgeon: Earnestine Leys, MD;  Location: [facility] ORS;  Service: Orthopedics;  Laterality: Left;   EYE SURGERY     laser   TRANSURETHRAL RESECTION OF PROSTATE N/A 08/02/2017   Procedure: TRANSURETHRAL RESECTION OF THE PROSTATE (TURP);  Surgeon: Hollice Espy, MD;  Location: [facility] ORS;  Service: Urology;  Laterality: N/A;     Social History:   reports that he has never smoked. He has never used smokeless tobacco. He reports that he does not drink alcohol and does not use drugs.   Family History:  His family history includes Cancer in his mother; Diabetes in his brother; Heart disease in his father.  There is no history of Prostate cancer, Bladder Cancer, or Kidney cancer.   Allergies Allergies  Allergen Reactions   Gabapentin Other (See Comments)    "made me fall out"     Home Medications  Prior to Admission medications   Medication Sig Start Date End Date Taking? Authorizing Provider  albuterol (VENTOLIN HFA) 108 (90 Base) MCG/ACT inhaler Inhale 2 puffs into the lungs every 6 (six) hours as needed for wheezing or shortness of breath. 02/04/21  Yes Cannady, Jolene T, NP  aspirin EC 81 MG tablet Take 81 mg by mouth daily. Swallow whole.   Yes [provider]  atorvastatin (LIPITOR) 80 MG tablet TAKE 1 TABLET EVERY DAY 12/26/20  Yes Cannady, Jolene T, NP  fluticasone (FLONASE) 50 MCG/ACT nasal spray Place 2 sprays into both nostrils daily as needed.    Yes [provider]  insulin glargine (LANTUS) 100 UNIT/ML injection Inject 0.35 mLs (35 Units total) into the skin every evening. 1800 02/04/21  Yes Cannady, Jolene T, NP  insulin lispro (HUMALOG KWIKPEN) 100 UNIT/ML KwikPen Inject 15 Units into the skin 3 (three) times daily before meals. Continue to check blood sugar after meals. Patient taking differently: Inject 15 Units into the skin in the morning and at bedtime. Continue to check blood sugar after meals. 02/05/21  Yes Sharen Hones, MD  lisinopril (ZESTRIL) 5 MG tablet Take 5 mg by mouth daily.   Yes [provider]  metFORMIN (GLUCOPHAGE) 500 MG tablet Take 2 tablets (1,000 mg total) by mouth 2 (two) times daily with a meal. 107m in the morning 10024mat night 05/17/20  Yes Cannady, JoBarbaraann FasterNP  Omega-3 Fatty Acids (OMEGA 3 500 PO) Take 500 mg by mouth daily.   Yes [provider]  omeprazole (PRILOSEC) 20 MG capsule Take 1 capsule (20 mg total) by mouth daily. 01/14/21  Yes Cannady, Henrine Screws T, NP  tamsulosin (FLOMAX) 0.4 MG CAPS capsule TAKE 1 CAPSULE EVERY DAY 07/31/20  Yes Cannady, Jolene T, NP  TRUE METRIX BLOOD GLUCOSE TEST test strip CHECK BLOOD  SUGAR TWICE DAILY 11/01/20   Marnee Guarneri T, NP  TRUEplus Lancets 33G MISC CHECK BLOOD SUGAR TWICE DAILY 10/18/20   Venita Lick, NP     Critical care time: 55 minutes     [name], AGACNP-BC [facility] Prefer epic messenger for cross cover needs If after hours, please call E-link

## 2021-02-28 NOTE — Consult Note (Signed)
  Pharmacy Antibiotic Note  Scott Lawson. is a 80 y.o. male admitted on 02/28/2021 with sepsis.  Pharmacy has been consulted for vancomycin and cefepime dosing.  Patient with PMH including T2DM, HTN, and HLD presenting after 2 falls at home. While in the ED, patient found to have HHS (now on insulin gtt) and was hypotensive. S/p LR bolus 1L x 4 and now on Levophed gtt. Chest x-ray showing possible pneumonia. Renal function unstable with Scr 2.1 '@0452'$  > 1.77 '@0913'$  (BL 1.1-1.4)   Plan: Vancomycin loading dose 1750 mg x 1; then based on current renal fxn next dose not due for 36-48hrs. Check renal fxn with AM labs to determine appropriate maintenance dosing schedule or if dosing by random levels is needed.  Cefepime 2 grams every 12 hours Monitor renal function, WBC, and vitals    Height: '5\' 9"'$  (175.3 cm) Weight: 78 kg (171 lb 15.3 oz) IBW/kg (Calculated) : 70.7  Temp (24hrs), Avg:97.6 F (36.4 C), Min:97.6 F (36.4 C), Max:97.6 F (36.4 C)  Recent Labs  Lab 02/28/21 0452 02/28/21 0645 02/28/21 0913 02/28/21 1016  WBC 10.5  --   --   --   CREATININE 2.13*  --  1.77*  --   LATICACIDVEN  --  2.4*  --  3.3*    Estimated Creatinine Clearance: 33.8 mL/min (A) (by C-G formula based on SCr of 1.77 mg/dL (H)).    Allergies  Allergen Reactions   Gabapentin Other (See Comments)    "made me fall out"    Antimicrobials this admission: cefepime 9/16 >>  vancomycin 9/16 >>   Dose adjustments this admission: None  Microbiology results: 9/16 BCx: NG < 12 Hrs 9/16 MRSA PCR: Not detected  Thank you for allowing pharmacy to be a part of this patient's care.  Wynelle Cleveland, PharmD Pharmacy Resident  02/28/2021 5:34 PM

## 2021-03-01 ENCOUNTER — Ambulatory Visit: Payer: Medicare Other

## 2021-03-01 ENCOUNTER — Inpatient Hospital Stay: Payer: Medicare Other

## 2021-03-01 DIAGNOSIS — E1100 Type 2 diabetes mellitus with hyperosmolarity without nonketotic hyperglycemic-hyperosmolar coma (NKHHC): Secondary | ICD-10-CM | POA: Diagnosis not present

## 2021-03-01 DIAGNOSIS — N179 Acute kidney failure, unspecified: Secondary | ICD-10-CM

## 2021-03-01 DIAGNOSIS — E1165 Type 2 diabetes mellitus with hyperglycemia: Secondary | ICD-10-CM

## 2021-03-01 DIAGNOSIS — A419 Sepsis, unspecified organism: Secondary | ICD-10-CM | POA: Diagnosis not present

## 2021-03-01 DIAGNOSIS — R6521 Severe sepsis with septic shock: Secondary | ICD-10-CM

## 2021-03-01 DIAGNOSIS — J189 Pneumonia, unspecified organism: Secondary | ICD-10-CM

## 2021-03-01 DIAGNOSIS — R739 Hyperglycemia, unspecified: Secondary | ICD-10-CM | POA: Diagnosis not present

## 2021-03-01 LAB — BASIC METABOLIC PANEL
Anion gap: 7 (ref 5–15)
Anion gap: 5 (ref 5–15)
BUN: 15 mg/dL (ref 8–23)
BUN: 18 mg/dL (ref 8–23)
CO2: 23 mmol/L (ref 22–32)
CO2: 27 mmol/L (ref 22–32)
Calcium: 7.9 mg/dL — ABNORMAL LOW (ref 8.9–10.3)
Calcium: 8.8 mg/dL — ABNORMAL LOW (ref 8.9–10.3)
Chloride: 102 mmol/L (ref 98–111)
Chloride: 105 mmol/L (ref 98–111)
Creatinine, Ser: 1.01 mg/dL (ref 0.61–1.24)
Creatinine, Ser: 1.28 mg/dL — ABNORMAL HIGH (ref 0.61–1.24)
GFR, Estimated: >60 mL/min (ref >60)
GFR, Estimated: 57 mL/min — ABNORMAL LOW (ref >60)
Glucose, Bld: 295 mg/dL — ABNORMAL HIGH (ref 70–99)
Glucose, Bld: 173 mg/dL — ABNORMAL HIGH (ref 70–99)
Potassium: 3.6 mmol/L (ref 3.5–5.1)
Potassium: 3.7 mmol/L (ref 3.5–5.1)
Sodium: 132 mmol/L — ABNORMAL LOW (ref 135–145)
Sodium: 137 mmol/L (ref 135–145)

## 2021-03-01 LAB — CBC WITH DIFFERENTIAL/PLATELET
Abs Immature Granulocytes: 0.07 10*3/uL (ref 0.00–0.07)
Basophils Absolute: 0.1 10*3/uL (ref 0.0–0.1)
Basophils Relative: 1 %
Eosinophils Absolute: 0.3 10*3/uL (ref 0.0–0.5)
Eosinophils Relative: 3 %
HCT: 32.4 % — ABNORMAL LOW (ref 39.0–52.0)
Hemoglobin: 11 g/dL — ABNORMAL LOW (ref 13.0–17.0)
Immature Granulocytes: 1 %
Lymphocytes Relative: 17 %
Lymphs Abs: 1.8 10*3/uL (ref 0.7–4.0)
MCH: 31.5 pg (ref 26.0–34.0)
MCHC: 34 g/dL (ref 30.0–36.0)
MCV: 92.8 fL (ref 80.0–100.0)
Monocytes Absolute: 1.2 10*3/uL — ABNORMAL HIGH (ref 0.1–1.0)
Monocytes Relative: 12 %
Neutro Abs: 7 10*3/uL (ref 1.7–7.7)
Neutrophils Relative %: 66 %
Platelets: 176 10*3/uL (ref 150–400)
RBC: 3.49 MIL/uL — ABNORMAL LOW (ref 4.22–5.81)
RDW: 12.9 % (ref 11.5–15.5)
WBC: 10.4 10*3/uL (ref 4.0–10.5)
nRBC: 0 % (ref 0.0–0.2)

## 2021-03-01 LAB — MAGNESIUM
Magnesium: 1.3 mg/dL — ABNORMAL LOW (ref 1.7–2.4)
Magnesium: 1.7 mg/dL (ref 1.7–2.4)

## 2021-03-01 LAB — GLUCOSE, CAPILLARY
Glucose-Capillary: 177 mg/dL — ABNORMAL HIGH (ref 70–99)
Glucose-Capillary: 183 mg/dL — ABNORMAL HIGH (ref 70–99)
Glucose-Capillary: 264 mg/dL — ABNORMAL HIGH (ref 70–99)
Glucose-Capillary: 296 mg/dL — ABNORMAL HIGH (ref 70–99)
Glucose-Capillary: 300 mg/dL — ABNORMAL HIGH (ref 70–99)
Glucose-Capillary: 311 mg/dL — ABNORMAL HIGH (ref 70–99)
Glucose-Capillary: 371 mg/dL — ABNORMAL HIGH (ref 70–99)

## 2021-03-01 LAB — PHOSPHORUS
Phosphorus: 1.4 mg/dL — ABNORMAL LOW (ref 2.5–4.6)
Phosphorus: 1.9 mg/dL — ABNORMAL LOW (ref 2.5–4.6)

## 2021-03-01 LAB — VANCOMYCIN, RANDOM: Vancomycin Rm: 11

## 2021-03-01 LAB — LACTIC ACID, PLASMA
Lactic Acid, Venous: 1.5 mmol/L (ref 0.5–1.9)
Lactic Acid, Venous: 3 mmol/L (ref 0.5–1.9)
Lactic Acid, Venous: 5.3 mmol/L (ref 0.5–1.9)

## 2021-03-01 LAB — PROCALCITONIN: Procalcitonin: <0.1 ng/mL

## 2021-03-01 MED ORDER — MAGNESIUM SULFATE 4 GM/100ML IV SOLN
4.0000 g | Freq: Once | INTRAVENOUS | Status: DC
  Filled 2021-03-01: qty 100

## 2021-03-01 MED ORDER — AZITHROMYCIN 500 MG PO TABS
500.0000 mg | ORAL_TABLET | Freq: Every day | ORAL | Status: DC
  Administered 2021-03-01 – 2021-03-04 (×4): 500 mg via ORAL
  Filled 2021-03-01 (×4): qty 1

## 2021-03-01 MED ORDER — VANCOMYCIN HCL 1250 MG/250ML IV SOLN
1250.0000 mg | INTRAVENOUS | Status: DC
  Administered 2021-03-01: 1250 mg via INTRAVENOUS
  Filled 2021-03-01 (×2): qty 250

## 2021-03-01 MED ORDER — MAGNESIUM SULFATE 2 GM/50ML IV SOLN
2.0000 g | Freq: Once | INTRAVENOUS | Status: AC
  Administered 2021-03-01: 2 g via INTRAVENOUS
  Filled 2021-03-01: qty 50

## 2021-03-01 MED ORDER — NEPRO/CARBSTEADY PO LIQD
237.0000 mL | Freq: Three times a day (TID) | ORAL | Status: DC
  Administered 2021-03-01: 237 mL via ORAL

## 2021-03-01 MED ORDER — POTASSIUM PHOSPHATES 15 MMOLE/5ML IV SOLN
45.0000 mmol | Freq: Once | INTRAVENOUS | Status: DC
  Filled 2021-03-01: qty 15

## 2021-03-01 MED ORDER — INSULIN ASPART 100 UNIT/ML IJ SOLN
0.0000 [IU] | INTRAMUSCULAR | Status: DC
  Administered 2021-03-01 (×2): 11 [IU] via SUBCUTANEOUS
  Administered 2021-03-01: 20 [IU] via SUBCUTANEOUS
  Administered 2021-03-02: 11 [IU] via SUBCUTANEOUS
  Filled 2021-03-01 (×4): qty 1

## 2021-03-01 MED ORDER — LACTATED RINGERS IV BOLUS
1000.0000 mL | Freq: Once | INTRAVENOUS | Status: AC
  Administered 2021-03-01: 1000 mL via INTRAVENOUS

## 2021-03-01 MED ORDER — HYDROCORTISONE SOD SUC (PF) 100 MG IJ SOLR
100.0000 mg | Freq: Three times a day (TID) | INTRAMUSCULAR | Status: DC
  Administered 2021-03-01 – 2021-03-03 (×6): 100 mg via INTRAVENOUS
  Filled 2021-03-01 (×6): qty 2

## 2021-03-01 MED ORDER — ADULT MULTIVITAMIN W/MINERALS CH
1.0000 | ORAL_TABLET | Freq: Every day | ORAL | Status: DC
  Administered 2021-03-02 – 2021-03-04 (×3): 1 via ORAL
  Filled 2021-03-01 (×3): qty 1

## 2021-03-01 MED ORDER — POTASSIUM PHOSPHATES 15 MMOLE/5ML IV SOLN
30.0000 mmol | Freq: Once | INTRAVENOUS | Status: AC
  Administered 2021-03-01: 30 mmol via INTRAVENOUS
  Filled 2021-03-01: qty 10

## 2021-03-01 MED ORDER — MAGNESIUM SULFATE 2 GM/50ML IV SOLN
2.0000 g | Freq: Once | INTRAVENOUS | Status: DC
  Filled 2021-03-01: qty 50

## 2021-03-01 NOTE — Consult Note (Signed)
PHARMACY CONSULT NOTE - FOLLOW UP  Pharmacy Consult for Electrolyte Monitoring and Replacement   Recent Labs: Potassium (mmol/L)  Date Value  03/01/2021 3.7  07/17/2014 4.2   Magnesium (mg/dL)  Date Value  03/01/2021 1.7  07/16/2014 1.9   Calcium (mg/dL)  Date Value  03/01/2021 8.8 (L)   Calcium, Total (mg/dL)  Date Value  07/17/2014 8.5   Albumin (g/dL)  Date Value  02/28/2021 3.8  02/04/2021 3.9   Phosphorus (mg/dL)  Date Value  03/01/2021 1.9 (L)   Sodium (mmol/L)  Date Value  03/01/2021 137  02/04/2021 129 (L)  07/17/2014 136    Assessment: 80YOM PMH T2DM on insulin at home, HTN, HLD, presenting to the ED s/p 2 falls at home. He met criteria for HHS, therefore he was given 4 L of Lactated Ringer's and was placed on an insulin drip. Patient initially poorly responsive to fluid bolusing, requiring initiation of Levophed gtt and transfer to ICU for further management.  On LR 125 ml/hr.   Labs: K; 4.2 > 3.3 > 3.7 Na; 130 (corrected: 135; BG 408) >   Goal of Therapy:  K > 4 while on insulin gtt All others WNL  Plan:  Medical team ordered Kphos 30 mmol x 1  All other Lytes WNL; no further repletion at this time. F/u with AM labs.   Eleonore Chiquito, PharmD, BCPS 03/01/2021 9:27 AM

## 2021-03-01 NOTE — Significant Event (Signed)
Was the fall witnessed: no  Patient condition before and after the fall: stable unchanged  Patient's reaction to the fall: frustrated  Name of the doctor that was notified including date and time: Damita Dunnings 09/16 2155  Any interventions and vital signs: post fall flowsheet started, pt had head and c spine CT that was normal. MD Damita Dunnings came to bedside and saw pt. Pt now has sitter and floor mats. Family notified.

## 2021-03-01 NOTE — Progress Notes (Signed)
[facility] PROGRESS NOTE   Scott Lawson. TO:[id number] DOB: 1941/03/13 DOA: 02/28/2021  PCP: Venita Lick, NP  Brief History/Interval Summary: 80 y.o. male with a past medical history of insulin-dependent diabetes mellitus type 2, hypertension, dyslipidemia, BPH, GERD.  Presented to the emergency department after sustaining 2 falls at home.  Did not want to come to the hospital after his initial fall.  Patient noted to be a poor historian.  He was found to have severe hyperglycemia requiring IV insulin infusion.  Subsequently became hypotensive with concern for sepsis from pneumonia.  He was admitted to the ICU.  Seen by critical care medicine.  Started on broad-spectrum antibiotics.  Required initiation of vasopressors as well.   Consultants: Critical care medicine  Procedures: PICC line placement.   Central line placement (patient pulled out central line)  Antibiotics: Anti-infectives (From admission, onward)    Start     Dose/Rate Route Frequency Ordered Stop   02/28/21 2200  ceFEPIme (MAXIPIME) 2 g in sodium chloride 0.9 % 100 mL IVPB        2 g 200 mL/hr over 30 Minutes Intravenous Every 12 hours 02/28/21 1841     02/28/21 1300  vancomycin (VANCOREADY) IVPB 750 mg/150 mL       See Hyperspace for full Linked Orders Report.   750 mg 150 mL/hr over 60 Minutes Intravenous  Once 02/28/21 1159 02/28/21 1414   02/28/21 1200  ceFEPIme (MAXIPIME) 2 g in sodium chloride 0.9 % 100 mL IVPB        2 g 200 mL/hr over 30 Minutes Intravenous  Once 02/28/21 1159 02/28/21 1316   02/28/21 1200  vancomycin (VANCOCIN) IVPB 1000 mg/200 mL premix       See Hyperspace for full Linked Orders Report.   1,000 mg 200 mL/hr over 60 Minutes Intravenous  Once 02/28/21 1159 02/28/21 1636       Subjective/Interval History: Patient is pleasantly distracted.  Does not answer all questions appropriately.  Seems to be slightly confused.  Denies any pain issues.  Noted to be  fidgety.     Assessment/Plan:  Shock, septic versus hypovolemic versus combination/questionable pneumonia/acute respiratory failure with hypoxia Hypotension was refractory to aggressive IV fluid hydration.  Subsequently started on vasopressors.  Remains on Levophed.  Critical care medicine is following.  Lactic acid level peaked at 5.3.  Improved to 1.5.   Concern raised for pneumonia on imaging studies. Respiratory status is stable.  He is currently on 4 L of oxygen via nasal cannula. Follow-up on blood cultures.  Continue vancomycin and cefepime for now.  Consider adding azithromycin to cover atypicals. WBC however noted to be normal.  Procalcitonin less than 0.1.  MRSA PCR negative. We will consider de-escalating antibiotics depending on clinical status and culture data in the next 24 to 48 hours.  Acute kidney injury Presented with a creatinine of 2.13.  Baseline is around 1.1.  Likely due to combination of sepsis and hypovolemia.  Was aggressively hydrated.  Improved to 1.28 today.  Monitor urine output.  Hyperosmolar hyperglycemic state/history of insulin-dependent diabetes mellitus, uncontrolled Presented with a significantly elevated glucose levels of greater than 700.  Unclear if this is due to noncompliance or not.  Placed on insulin infusion.  Now has been transitioned to Levemir.  HbA1c greater than 14 implying very poor control.  Patient likely is noncompliant with his insulin regimen at home.  There could be underlying cognitive issues.  Acute metabolic encephalopathy Patient likely has underlying  cognitive issues.  Chronic ischemic changes noted on CT head.  Will check TSH, [id number], folic acid levels.  Check RPR.  No acute neurological changes noted.  Essential hypertension As discussed above patient with septic shock.  Antihypertensives on hold.  Dyslipidemia Continue Lipitor.  History of GERD Continue Protonix.  History of BPH Noted to be on tamsulosin which is being  continued.  Hypophosphatemia Potassium phosphate ordered.  Normocytic anemia Some drop in hemoglobin is likely dilutional.  No overt bleeding noted.  Continue to monitor.  Physical deconditioning with falls at home No injuries noted on imaging studies.  He is able to move his extremities without problem.  PT and OT evaluation when more stable.   DVT Prophylaxis: Subcutaneous heparin Code Status: Full code Family Communication: No family at bedside.  Apparently lives with his daughter and granddaughter.  We will call them later today. Disposition Plan: To be determined.  Will need PT and OT evaluation due to his history of falls.  Status is: Inpatient  Remains inpatient appropriate because:Hemodynamically unstable, IV treatments appropriate due to intensity of illness or inability to take PO, and Inpatient level of care appropriate due to severity of illness  Dispo: The patient is from: Home              Anticipated d/c is to: Home              Patient currently is not medically stable to d/c.   Difficult to place patient No      Medications: Prior to Admission:  Medications Prior to Admission  Medication Sig Dispense Refill Last Dose   albuterol (VENTOLIN HFA) 108 (90 Base) MCG/ACT inhaler Inhale 2 puffs into the lungs every 6 (six) hours as needed for wheezing or shortness of breath. 18 g 4 prn at prn   aspirin EC 81 MG tablet Take 81 mg by mouth daily. Swallow whole.   02/27/2021 at am   atorvastatin (LIPITOR) 80 MG tablet TAKE 1 TABLET EVERY DAY 90 tablet 0 02/27/2021 at am   fluticasone (FLONASE) 50 MCG/ACT nasal spray Place 2 sprays into both nostrils daily as needed.    prn at prn   insulin glargine (LANTUS) 100 UNIT/ML injection Inject 0.35 mLs (35 Units total) into the skin every evening. 1800 10 mL 11 02/26/2021 at pm   insulin lispro (HUMALOG KWIKPEN) 100 UNIT/ML KwikPen Inject 15 Units into the skin 3 (three) times daily before meals. Continue to check blood sugar after  meals. (Patient taking differently: Inject 15 Units into the skin in the morning and at bedtime. Continue to check blood sugar after meals.) 15 mL 5 02/27/2021 at am   lisinopril (ZESTRIL) 5 MG tablet Take 5 mg by mouth daily.   02/27/2021 at am   metFORMIN (GLUCOPHAGE) 500 MG tablet Take 2 tablets (1,000 mg total) by mouth 2 (two) times daily with a meal. '1000mg'$  in the morning '1000mg'$  at night 360 tablet 4 02/27/2021 at am   Omega-3 Fatty Acids (OMEGA 3 500 PO) Take 500 mg by mouth daily.   02/27/2021 at am   omeprazole (PRILOSEC) 20 MG capsule Take 1 capsule (20 mg total) by mouth daily. 90 capsule 0 02/27/2021 at am   tamsulosin (FLOMAX) 0.4 MG CAPS capsule TAKE 1 CAPSULE EVERY DAY 90 capsule 2 02/27/2021 at am   TRUE METRIX BLOOD GLUCOSE TEST test strip CHECK BLOOD SUGAR TWICE DAILY 200 strip 3    TRUEplus Lancets 33G MISC CHECK BLOOD SUGAR TWICE DAILY 200 each  1    Scheduled:  aspirin EC  81 mg Oral Daily   atorvastatin  80 mg Oral Daily   Chlorhexidine Gluconate Cloth  6 each Topical Daily   heparin  5,000 Units Subcutaneous Q8H   insulin aspart  0-15 Units Subcutaneous TID WC   insulin aspart  0-5 Units Subcutaneous QHS   insulin detemir  0.3 Units/kg Subcutaneous Q24H   midodrine  10 mg Oral TID WC   omega-3 acid ethyl esters  1,000 mg Oral Daily   pantoprazole  40 mg Oral Daily   sodium chloride flush  10-40 mL Intracatheter Q12H   tamsulosin  0.4 mg Oral Daily   Continuous:  ceFEPime (MAXIPIME) IV Stopped (03/01/21 0901)   lactated ringers Stopped (02/28/21 2339)   norepinephrine (LEVOPHED) Adult infusion 6 mcg/min (03/01/21 0922)   potassium PHOSPHATE IVPB (in mmol) 85 mL/hr at 03/01/21 0922     Objective:  Vital Signs  Vitals:   03/01/21 0830 03/01/21 0845 03/01/21 0900 03/01/21 0915  BP: (!) 91/54  (!) 93/53   Pulse: 93 83 83 80  Resp: 19 (!) 28 (!) 29 (!) 23  Temp:      TempSrc:      SpO2: (!) 88% 98% 96% 96%  Weight:      Height:        Intake/Output Summary  (Last 24 hours) at 03/01/2021 1042 Last data filed at 03/01/2021 0930 Gross per 24 hour  Intake 5665.11 ml  Output 3500 ml  Net 2165.11 ml   Filed Weights   02/28/21 0444  Weight: 78 kg    General appearance: Awake alert.  In no distress.  Pleasantly confused Resp: Tachypnea with coarse breath sounds bilaterally.  Crackles at the bases.  No wheezing. Cardio: S1-S2 is normal regular.  No S3-S4.  No rubs murmurs or bruit GI: Abdomen is soft.  Nontender nondistended.  Bowel sounds are present normal.  No masses organomegaly Extremities: No edema.  Full range of motion of lower extremities. Neurologic: Alert and oriented x3.  No focal neurological deficits.    Lab Results:  Data Reviewed: I have personally reviewed following labs and imaging studies  CBC: Recent Labs  Lab 02/28/21 0452 03/01/21 0415  WBC 10.5 10.4  NEUTROABS 5.4 7.0  HGB 15.2 11.0*  HCT 44.3 32.4*  MCV 89.7 92.8  PLT 259 0000000    Basic Metabolic Panel: Recent Labs  Lab 02/28/21 1244 02/28/21 1643 02/28/21 2124 03/01/21 0415 03/01/21 0542  NA 130* 132* 133* 132* 137  K 3.3* 3.1* 3.7 3.6 3.7  CL 93* 96* 100 102 105  CO2 '26 26 24 23 27  '$ GLUCOSE 357* 188* 149* 295* 173*  BUN 24* 24* '21 15 18  '$ CREATININE 1.70* 1.60* 1.36* 1.01 1.28*  CALCIUM 8.9 9.0 8.8* 7.9* 8.8*  MG  --   --   --  1.3* 1.7  PHOS  --   --   --  1.4* 1.9*    GFR: Estimated Creatinine Clearance: 46.8 mL/min (A) (by C-G formula based on SCr of 1.28 mg/dL (H)).  Liver Function Tests: Recent Labs  Lab 02/28/21 0452  AST 15  ALT 11  ALKPHOS 98  BILITOT 1.5*  PROT 6.9  ALBUMIN 3.8      CBG: Recent Labs  Lab 02/28/21 1943 02/28/21 2105 02/28/21 2332 03/01/21 0533 03/01/21 0742  GLUCAP 138* 154* 154* 183* 177*      Recent Results (from the past 240 hour(s))  Resp Panel by RT-PCR (Flu A&B,  Covid) Nasopharyngeal Swab     Status: None   Collection Time: 02/28/21  4:52 AM   Specimen: Nasopharyngeal Swab;  Nasopharyngeal(NP) swabs in vial transport medium  Result Value Ref Range Status   SARS Coronavirus 2 by RT PCR NEGATIVE NEGATIVE Final    Comment: (NOTE) SARS-CoV-2 target nucleic acids are NOT DETECTED.  The SARS-CoV-2 RNA is generally detectable in upper respiratory specimens during the acute phase of infection. The lowest concentration of SARS-CoV-2 viral copies this assay can detect is 138 copies/mL. A negative result does not preclude SARS-Cov-2 infection and should not be used as the sole basis for treatment or other patient management decisions. A negative result may occur with  improper specimen collection/handling, submission of specimen other than nasopharyngeal swab, presence of viral mutation(s) within the areas targeted by this assay, and inadequate number of viral copies(<138 copies/mL). A negative result must be combined with clinical observations, patient history, and epidemiological information. The expected result is Negative.  Fact Sheet for Patients:  EntrepreneurPulse.com.au  Fact Sheet for Healthcare Providers:  IncredibleEmployment.be  This test is no t yet approved or cleared by the [place] FDA and  has been authorized for detection and/or diagnosis of SARS-CoV-2 by FDA under an Emergency Use Authorization (EUA). This EUA will remain  in effect (meaning this test can be used) for the duration of the COVID-19 declaration under Section 564(b)(1) of the Act, 21 U.S.C.section 360bbb-3(b)(1), unless the authorization is terminated  or revoked sooner.       Influenza A by PCR NEGATIVE NEGATIVE Final   Influenza B by PCR NEGATIVE NEGATIVE Final    Comment: (NOTE) The Xpert Xpress SARS-CoV-2/FLU/RSV plus assay is intended as an aid in the diagnosis of influenza from Nasopharyngeal swab specimens and should not be used as a sole basis for treatment. Nasal washings and aspirates are unacceptable for Xpert Xpress  SARS-CoV-2/FLU/RSV testing.  Fact Sheet for Patients: EntrepreneurPulse.com.au  Fact Sheet for Healthcare Providers: IncredibleEmployment.be  This test is not yet approved or cleared by the [place] FDA and has been authorized for detection and/or diagnosis of SARS-CoV-2 by FDA under an Emergency Use Authorization (EUA). This EUA will remain in effect (meaning this test can be used) for the duration of the COVID-19 declaration under Section 564(b)(1) of the Act, 21 U.S.C. section 360bbb-3(b)(1), unless the authorization is terminated or revoked.  Performed at [facility], [address] [place], [place] [address]   Blood culture (routine x 2)     Status: None (Preliminary result)   Collection Time: 02/28/21  6:40 AM   Specimen: BLOOD  Result Value Ref Range Status   Specimen Description BLOOD RIGHT ASSIST CONTROL  Final   Special Requests   Final    BOTTLES DRAWN AEROBIC AND ANAEROBIC Blood Culture adequate volume   Culture   Final    NO GROWTH < 24 HOURS Performed at [facility], [address], [place], [place] [address]    Report Status PENDING  Incomplete  Blood culture (routine x 2)     Status: None (Preliminary result)   Collection Time: 02/28/21  6:45 AM   Specimen: BLOOD  Result Value Ref Range Status   Specimen Description BLOOD LEFT HAND  Final   Special Requests   Final    BOTTLES DRAWN AEROBIC AND ANAEROBIC Blood Culture adequate volume   Culture   Final    NO GROWTH < 24 HOURS Performed at [facility], [address] [place], [place] [address]  Report Status PENDING  Incomplete  MRSA Next Gen by PCR, Nasal     Status: None   Collection Time: 02/28/21 12:15 PM   Specimen: Nasal Mucosa; Nasal Swab  Result Value Ref Range Status   MRSA by PCR Next Gen NOT DETECTED NOT DETECTED Final    Comment: (NOTE) The GeneXpert MRSA Assay (FDA approved for NASAL specimens only), is one  component of a comprehensive MRSA colonization surveillance program. It is not intended to diagnose MRSA infection nor to guide or monitor treatment for MRSA infections. Test performance is not FDA approved in patients less than 18 years old. Performed at [facility], [address] [place], [place] [address]       Radiology Studies: CT HEAD WO CONTRAST (5MM)  Result Date: 03/01/2021 CLINICAL DATA:  Head trauma EXAM: CT HEAD WITHOUT CONTRAST CT CERVICAL SPINE WITHOUT CONTRAST TECHNIQUE: Multidetector CT imaging of the head and cervical spine was performed following the standard protocol without intravenous contrast. Multiplanar CT image reconstructions of the cervical spine were also generated. COMPARISON:  None. FINDINGS: CT HEAD FINDINGS Brain: There is no mass, hemorrhage or extra-axial collection. The size and configuration of the ventricles and extra-axial CSF spaces are normal. There is hypoattenuation of the periventricular white matter, most commonly indicating chronic ischemic microangiopathy. Vascular: No abnormal hyperdensity of the major intracranial arteries or dural venous sinuses. No intracranial atherosclerosis. Skull: The visualized skull base, calvarium and extracranial soft tissues are normal. Sinuses/Orbits: No fluid levels or advanced mucosal thickening of the visualized paranasal sinuses. No mastoid or middle ear effusion. The orbits are normal. CT CERVICAL SPINE FINDINGS Alignment: No static subluxation. Facets are aligned. Occipital condyles are normally positioned. Skull base and vertebrae: No acute fracture. Soft tissues and spinal canal: No prevertebral fluid or swelling. No visible canal hematoma. Disc levels: No advanced spinal canal or neural foraminal stenosis. Upper chest: No pneumothorax, pulmonary nodule or pleural effusion. Other: Normal visualized paraspinal cervical soft tissues. IMPRESSION: 1. Chronic ischemic microangiopathy without acute intracranial  abnormality. 2. No acute fracture or static subluxation of the cervical spine. Electronically Signed   By: Ulyses Jarred M.D.   On: 03/01/2021 00:56   CT HEAD WO CONTRAST (5MM)  Result Date: 02/28/2021 CLINICAL DATA:  Fall.  Head trauma, minor (Age >= 65y) EXAM: CT HEAD WITHOUT CONTRAST TECHNIQUE: Contiguous axial images were obtained from the base of the skull through the vertex without intravenous contrast. COMPARISON:  02/04/2021 FINDINGS: Brain: There is atrophy and chronic small vessel disease changes. No acute intracranial abnormality. Specifically, no hemorrhage, hydrocephalus, mass lesion, acute infarction, or significant intracranial injury. Vascular: No hyperdense vessel or unexpected calcification. Skull: No acute calvarial abnormality. Sinuses/Orbits: No acute findings Other: None IMPRESSION: Atrophy, chronic microvascular disease. No acute intracranial abnormality. Electronically Signed   By: Rolm Baptise M.D.   On: 02/28/2021 08:13   CT CHEST WO CONTRAST  Result Date: 02/28/2021 CLINICAL DATA:  Chest pain and shortness of breath. EXAM: CT CHEST WITHOUT CONTRAST TECHNIQUE: Multidetector CT imaging of the chest was performed following the standard protocol without IV contrast. COMPARISON:  CTA Chest 07/10/2020 FINDINGS: Cardiovascular: Heart size upper normal. Coronary artery calcification is evident. Moderate atherosclerotic calcification is noted in the wall of the thoracic aorta. Mediastinum/Nodes: No mediastinal lymphadenopathy. Stable 11 mm short axis AP window lymph node. Stable 11 mm short axis subcarinal lymph node. No evidence for gross hilar lymphadenopathy although assessment is limited by the lack of intravenous contrast on the current study. The esophagus has  normal imaging features. There is no axillary lymphadenopathy. Lungs/Pleura: Architectural distortion and scarring is seen through both lungs with areas of subpleural reticulation bilaterally. The diffuse ground-glass opacity  seen previously has decreased substantially in the interval. Basilar predominant traction bronchiectasis is noted in the lungs bilaterally no overtly suspicious nodule or mass although assessment limited by the degree of scarring. No pleural effusion. Upper Abdomen: 15 mm left adrenal nodule has low density consistent with adenoma. Musculoskeletal: No worrisome lytic or sclerotic osseous abnormality. IMPRESSION: 1. Marked interval decrease in the diffuse ground-glass opacity seen previously. 2. Areas of architectural distortion and scarring bilaterally with subpleural reticulation in basilar predominant traction bronchiectasis. Underlying component of pulmonary fibrosis suspected. High-resolution chest CT may prove helpful to further evaluate as clinically warranted. 3. Stable mild mediastinal lymphadenopathy, likely reactive given the 9 month interval stability. 4. 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 5. Aortic Atherosclerosis (ICD10-I70.0). Electronically Signed   By: Misty Stanley M.D.   On: 02/28/2021 11:43   CT CERVICAL SPINE WO CONTRAST  Result Date: 03/01/2021 CLINICAL DATA:  Head trauma EXAM: CT HEAD WITHOUT CONTRAST CT CERVICAL SPINE WITHOUT CONTRAST TECHNIQUE: Multidetector CT imaging of the head and cervical spine was performed following the standard protocol without intravenous contrast. Multiplanar CT image reconstructions of the cervical spine were also generated. COMPARISON:  None. FINDINGS: CT HEAD FINDINGS Brain: There is no mass, hemorrhage or extra-axial collection. The size and configuration of the ventricles and extra-axial CSF spaces are normal. There is hypoattenuation of the periventricular white matter, most commonly indicating chronic ischemic microangiopathy. Vascular: No abnormal hyperdensity of the major intracranial arteries or dural venous sinuses. No intracranial atherosclerosis. Skull: The visualized skull base, calvarium and extracranial soft tissues are normal. Sinuses/Orbits: No fluid  levels or advanced mucosal thickening of the visualized paranasal sinuses. No mastoid or middle ear effusion. The orbits are normal. CT CERVICAL SPINE FINDINGS Alignment: No static subluxation. Facets are aligned. Occipital condyles are normally positioned. Skull base and vertebrae: No acute fracture. Soft tissues and spinal canal: No prevertebral fluid or swelling. No visible canal hematoma. Disc levels: No advanced spinal canal or neural foraminal stenosis. Upper chest: No pneumothorax, pulmonary nodule or pleural effusion. Other: Normal visualized paraspinal cervical soft tissues. IMPRESSION: 1. Chronic ischemic microangiopathy without acute intracranial abnormality. 2. No acute fracture or static subluxation of the cervical spine. Electronically Signed   By: Ulyses Jarred M.D.   On: 03/01/2021 00:56   CT Cervical Spine Wo Contrast  Result Date: 02/28/2021 CLINICAL DATA:  Fall.  Neck trauma (Age >= 65y) EXAM: CT CERVICAL SPINE WITHOUT CONTRAST TECHNIQUE: Multidetector CT imaging of the cervical spine was performed without intravenous contrast. Multiplanar CT image reconstructions were also generated. COMPARISON:  None. FINDINGS: Alignment: Normal Skull base and vertebrae: No acute fracture. No primary bone lesion or focal pathologic process. Soft tissues and spinal canal: No prevertebral fluid or swelling. No visible canal hematoma. Disc levels: Diffuse degenerative disc disease, most advanced from C4-5 through C6-7. Moderate bilateral degenerative facet disease, left greater than right. Multilevel bilateral neural foraminal narrowing. Upper chest: No acute findings Other: None IMPRESSION: Degenerative disc and facet disease.  No acute bony abnormality. Electronically Signed   By: Rolm Baptise M.D.   On: 02/28/2021 08:14   DG Chest Portable 1 View  Result Date: 02/28/2021 CLINICAL DATA:  Fall. EXAM: PORTABLE CHEST 1 VIEW COMPARISON:  06/24/2020 FINDINGS: 0616 hours. Patient rotated to the right. The  cardio pericardial silhouette is enlarged. Interstitial markings are diffusely coarsened with  chronic features. Superimpose patchy airspace disease noted bilaterally, right greater than left. No substantial pleural effusion. The visualized bony structures of the thorax show no acute abnormality. Telemetry leads overlie the chest. IMPRESSION: Stable exam. Diffuse interstitial opacity with patchy bibasilar airspace disease, right greater than left. Electronically Signed   By: Misty Stanley M.D.   On: 02/28/2021 06:31   [place] EKG SITE RITE  Result Date: 02/28/2021 If Site Rite image not attached, placement could not be confirmed due to current cardiac rhythm.      LOS: 1 day   Sharnise Blough [employer] on [email or web address]  03/01/2021, 10:42 AM

## 2021-03-01 NOTE — Plan of Care (Signed)
  Problem: Education: Goal: Knowledge of General Education information will improve Description: Including pain rating scale, medication(s)/side effects and non-pharmacologic comfort measures Outcome: Progressing   Problem: Health Behavior/Discharge Planning: Goal: Ability to manage health-related needs will improve Outcome: Progressing   Problem: Clinical Measurements: Goal: Ability to maintain clinical measurements within normal limits will improve Outcome: Progressing Goal: Will remain free from infection Outcome: Progressing Goal: Diagnostic test results will improve Outcome: Progressing Goal: Respiratory complications will improve Outcome: Progressing Goal: Cardiovascular complication will be avoided Outcome: Progressing   Problem: Activity: Goal: Risk for activity intolerance will decrease Outcome: Progressing   Problem: Nutrition: Goal: Adequate nutrition will be maintained Outcome: Progressing   Problem: Elimination: Goal: Will not experience complications related to bowel motility Outcome: Progressing Goal: Will not experience complications related to urinary retention Outcome: Progressing   Problem: Pain Managment: Goal: General experience of comfort will improve Outcome: Progressing   Problem: Safety: Goal: Ability to remain free from injury will improve Outcome: Progressing   Problem: Skin Integrity: Goal: Risk for impaired skin integrity will decrease Outcome: Progressing   Problem: Health Behavior/Discharge Planning: Goal: Ability to identify and utilize available resources and services will improve Outcome: Progressing Goal: Ability to manage health-related needs will improve Outcome: Progressing   Problem: Metabolic: Goal: Ability to maintain appropriate glucose levels will improve Outcome: Progressing

## 2021-03-01 NOTE — Consult Note (Signed)
  Pharmacy Antibiotic Note  Scott Lawson. is a 80 y.o. male admitted on 02/28/2021 with sepsis.  Pharmacy has been consulted for vancomycin and cefepime dosing.  Patient with PMH including T2DM, HTN, and HLD presenting after 2 falls at home. While in the ED, patient found to have HHS (now on insulin gtt) and was hypotensive. S/p LR bolus 1L x 4 and now on Levophed gtt. Chest x-ray showing possible pneumonia. Renal function unstable with Scr 2.1 '@0452'$  > 1.77 '@0913'$  (BL 1.1-1.4).   MRSA negative. Per MD plan to continue vancomycin and cefepime.   Plan: Vancomycin loading dose 1750 mg x 1; Ordered a vancomycin random ~ 24 hours post dose. Returned at 81. Will start maintenance dose of 1250 mg daily. Predicted AUC of 514. Css min 13. Scr ordered. Plan to obtain levels prior to the 4th or 5th dose.   Continue Cefepime 2 grams every 12 hours    Height: '5\' 9"'$  (175.3 cm) Weight: 78 kg (171 lb 15.3 oz) IBW/kg (Calculated) : 70.7  Temp (24hrs), Avg:98.1 F (36.7 C), Min:97.7 F (36.5 C), Max:98.3 F (36.8 C)  Recent Labs  Lab 02/28/21 0452 02/28/21 0645 02/28/21 1244 02/28/21 1636 02/28/21 1643 02/28/21 2124 02/28/21 2345 03/01/21 0415 03/01/21 0542 03/01/21 1243  WBC 10.5  --   --   --   --   --   --  10.4  --   --   CREATININE 2.13*   < > 1.70*  --  1.60* 1.36*  --  1.01 1.28*  --   LATICACIDVEN  --    < > 4.4* 3.3*  --   --  3.0* 5.3* 1.5  --   VANCORANDOM  --   --   --   --   --   --   --   --   --  11   < > = values in this interval not displayed.     Estimated Creatinine Clearance: 46.8 mL/min (A) (by C-G formula based on SCr of 1.28 mg/dL (H)).    Allergies  Allergen Reactions   Gabapentin Other (See Comments)    "made me fall out"    Antimicrobials this admission: cefepime 9/16 >>  vancomycin 9/16 >>   Dose adjustments this admission: None  Microbiology results: 9/16 BCx: NG < 12 Hrs 9/16 MRSA PCR: Not detected  Thank you for allowing pharmacy to be a  part of this patient's care.  Oswald Hillock, PharmD 03/01/2021 1:41 PM

## 2021-03-01 NOTE — TOC Initial Note (Signed)
Transition of Care (TOC) - Initial/Assessment Note    Patient Details  Name: Scott Lawson. MRN: [id number] Date of Birth: 12/26/1940  Transition of [facility]) CM/SW Contact:    Shelbie Hutching, RN Phone Number: 03/01/2021, 12:59 PM  Clinical Narrative:                 Patient admitted to the hospital with hyperglycemia and AKI.  RNCM did see patient at the bedside, he has a sitter today due to agitation, impulsiveness, and he had a fall last night.   RNCM attempted to call patient's granddaughter, [name], patient expressed to palliative that [name] would be the one he wanted to make decisions for him.  Patient lives with the granddaughter at home.  Granddaughter did not answer phone and no voicemail to leave a message on.  TOC will follow patient for discharge needs.     Expected Discharge Plan: Skilled Nursing Facility Barriers to Discharge: Continued Medical Work up   Patient Goals and CMS Choice Patient states their goals for this hospitalization and ongoing recovery are:: patient confused      Expected Discharge Plan and Services Expected Discharge Plan: [place]   Discharge Planning Services: CM Consult   Living arrangements for the past 2 months: Single Family Home                 DME Arranged: N/A DME Agency: NA                  Prior Living Arrangements/Services Living arrangements for the past 2 months: Single Family Home Lives with:: Relatives (Granddaughter) Patient language and need for interpreter reviewed:: Yes Do you feel safe going back to the place where you live?: Yes      Need for Family Participation in Patient Care: Yes (Comment) Care giver support system in place?: Yes (comment) (granddaughter and great granddaughter)   Criminal Activity/Legal Involvement Pertinent to Current Situation/Hospitalization: No - Comment as needed  Activities of Daily Living Home Assistive Devices/Equipment: Cane (specify quad or  straight), Oxygen, Shower chair with back, Environmental consultant (specify type) ADL Screening (condition at time of admission) Patient's cognitive ability adequate to safely complete daily activities?: Yes Is the patient deaf or have difficulty hearing?: No Does the patient have difficulty seeing, even when wearing glasses/contacts?: No Does the patient have difficulty concentrating, remembering, or making decisions?: Yes Patient able to express need for assistance with ADLs?: Yes Does the patient have difficulty dressing or bathing?: No Independently performs ADLs?: Yes (appropriate for developmental age) Does the patient have difficulty walking or climbing stairs?: Yes Weakness of Legs: Both Weakness of Arms/Hands: Both  Permission Sought/Granted Permission sought to share information with : Case Manager, Family Supports, Other (comment) Permission granted to share information with : Yes, Verbal Permission Granted  Share Information with NAME: [name] Garcialopez     Permission granted to share info w Relationship: granddaughter     Emotional Assessment Appearance:: Appears stated age Attitude/Demeanor/Rapport: Lethargic Affect (typically observed): Unable to Assess   Alcohol / Substance Use: Not Applicable Psych Involvement: No (comment)  Admission diagnosis:  Hyperglycemia [R73.9] AKI (acute kidney injury) ([place]) [N17.9] Hyperosmolar hyperglycemic state (HHS) ([place]) [E11.00, E11.65] Patient Active Problem List   Diagnosis Date Noted   Hyperosmolar hyperglycemic state (HHS) ([place]) 02/28/2021   Syncope and collapse 02/05/2021   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[place]) 02/04/2021   Weight loss, unintentional 02/04/2021   Uncontrolled type 2 diabetes mellitus with hyperosmolar nonketotic hyperglycemia ([place]) 02/04/2021  Essential hypertension 02/04/2021   Chronic diastolic CHF (congestive heart failure) ([place]) 02/04/2021   Syncope 02/04/2021   Emphysema lung ([place]) 10/26/2020   History of 2019 novel coronavirus  disease (COVID-19) 10/26/2020   Interstitial lung disease ([place]) 08/24/2020   Chronic respiratory failure with hypoxia ([place]) 06/20/2020   Atherosclerosis of aorta ([place]) 10/12/2019   Long-term insulin use ([place]) 10/12/2019   Vitamin B12 deficiency 06/28/2018   Prostate cancer ([place]) 06/27/2018   Psoriasis 08/16/2017   Advanced care planning/counseling discussion 12/01/2016   GERD without esophagitis 08/14/2015   Uncontrolled type 2 diabetes mellitus with chronic kidney disease ([place]) 08/13/2015   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[place]) 08/13/2015   Chronic kidney disease, stage 3a ([place]) 02/11/2015   Mixed hyperlipidemia due to type 2 diabetes mellitus ([place]) 02/11/2015   BPH (benign prostatic hyperplasia) 02/11/2015   PCP:  Venita Lick, NP Pharmacy:   [facility]) - [address], [place] [address] Phone: [phone] Fax: [phone]  [facility] [id number], [place] AT [address] [place] [address] Phone: [phone] Fax: [place], [address] [address] Phone: [phone] Fax: [phone]     Social Determinants of Health (SDOH) Interventions    Readmission Risk Interventions No flowsheet data found.

## 2021-03-01 NOTE — Progress Notes (Signed)
NAME:  Maddox Hlavaty., MRN:  [id number], DOB:  Oct 04, 1940, LOS: 1 ADMISSION DATE:  02/28/2021, INITIAL CONSULTATION DATE:  02/28/2021 REFERRING MD: Dr. Rowe Pavy , CHIEF COMPLAINT:  Falls   Brief Patient Description  80 y.o. Male admitted with HHS, multifactorial shock (hypovolemic vs. Septic), questionable pneumonia, and Acute Kidney Injury.  He met criteria for HHS, therefore he was given 4 L of lactated Ringer's and was placed on an insulin drip.  Despite aggressive IV fluids he remained hypotensive with systolic blood pressures in the 80s to low 90s.  Given poor fluid responsiveness to IV fluids, he was placed on low-dose Levophed.  Concern is for possible developing septic shock due to potential pneumonia.   Pertinent  Medical History  Diabetes mellitus type 2 BPH GERD Hypertension Hyperlipidemia  Significant Hospital Events: Including procedures, antibiotic start and stop dates in addition to other pertinent events   Significant labs: Sodium 125, chloride 87, glucose 755, BUN 29, creatinine 2.13, high-sensitivity troponin 39, lactic acid 2.4, WBC 10.5, serum osmolality 306 Urinalysis is negative for UTI, and negative for ketones COVID-19 PCR is negative Imaging: CT head without contrast>>Atrophy, chronic microvascular disease. No acute intracranial abnormality. CT cervical spine>>Degenerative disc and facet disease.  No acute bony abnormality. Chest x-ray>>Patient rotated to the right. The cardio pericardial silhouette is enlarged. Interstitial markings are diffusely coarsened with chronic features. Superimpose patchy airspace disease noted bilaterally, right greater than left. No substantial pleural effusion. The visualized bony structures of the thorax show no acute abnormality. Telemetry leads overlie the chest. CT chest without contrast>>1. Marked interval decrease in the diffuse ground-glass opacity seen previously. 2. Areas of architectural distortion and scarring  bilaterally with subpleural reticulation in basilar predominant traction bronchiectasis. Underlying component of pulmonary fibrosis suspected. High-resolution chest CT may prove helpful to further evaluate as clinically warranted. 3. Stable mild mediastinal lymphadenopathy, likely reactive given the 9 month interval stability. 4. 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 5. Aortic Atherosclerosis  Cultures:  SARS-CoV-2 PCR>> negative Influenza PCR>> negative Blood culture x2>>No growth thus far Urine>>no growth MRSA PCR>> negative Strep pneumo urinary antigen>> Legionella urinary antigen>> Mycoplasma pneumonia>>   Antimicrobials:  Cefepime 9/16>> Vancomycin 9/16>> Interim History / Subjective:  -Lactic acidosis resolved -Transitioned off insulin -Hyponatremia resolved with treatment of HHS -Renal function stable -Remains on Levophed @6mcg   OBJECTIVE   Blood pressure 119/74, pulse 78, temperature 98.3 F (36.8 C), temperature source Oral, resp. rate 19, height 5' 9"  (1.753 m), weight 78 kg, SpO2 95 %.        Intake/Output Summary (Last 24 hours) at 03/01/2021 0911 Last data filed at 03/01/2021 0842 Gross per 24 hour  Intake 6511.06 ml  Output 3050 ml  Net 3461.06 ml   Filed Weights   02/28/21 0444  Weight: 78 kg    Examination: GENERAL: 80 year-old patient lying in the bed with no acute distress.  EYES: Pupils equal, round, reactive to light and accommodation. No scleral icterus. Extraocular muscles intact.  HEENT: Head atraumatic, normocephalic. Oropharynx and nasopharynx clear.  NECK:  Supple, no jugular venous distention. No thyroid enlargement, no tenderness.  LUNGS: Normal breath sounds bilaterally, no wheezing, rales,rhonchi or crepitation. No use of accessory muscles of respiration.  CARDIOVASCULAR: S1, S2 normal. No murmurs, rubs, or gallops.  ABDOMEN: Soft, nontender, nondistended. Bowel sounds present. No organomegaly or mass.  EXTREMITIES: No pedal edema, cyanosis,  or clubbing.  NEUROLOGIC: Cranial nerves II through XII are intact. Muscle strength 5/5 in all extremities. Sensation intact. Gait not  checked.  PSYCHIATRIC: The patient is alert and oriented x 1 with intermittent confusion. Sitter at bedside for safety  SKIN: No obvious rash, lesion, or ulcer.   Labs/imaging that I havepersonally reviewed  (right click and "Reselect all SmartList Selections" daily)   Labs   CBC: Recent Labs  Lab 02/28/21 0452 03/01/21 0415  WBC 10.5 10.4  NEUTROABS 5.4 7.0  HGB 15.2 11.0*  HCT 44.3 32.4*  MCV 89.7 92.8  PLT 259 128    Basic Metabolic Panel: Recent Labs  Lab 02/28/21 1244 02/28/21 1643 02/28/21 2124 03/01/21 0415 03/01/21 0542  NA 130* 132* 133* 132* 137  K 3.3* 3.1* 3.7 3.6 3.7  CL 93* 96* 100 102 105  CO2 26 26 24 23 27   GLUCOSE 357* 188* 149* 295* 173*  BUN 24* 24* 21 15 18   CREATININE 1.70* 1.60* 1.36* 1.01 1.28*  CALCIUM 8.9 9.0 8.8* 7.9* 8.8*  MG  --   --   --  1.3* 1.7  PHOS  --   --   --  1.4* 1.9*   GFR: Estimated Creatinine Clearance: 46.8 mL/min (A) (by C-G formula based on SCr of 1.28 mg/dL (H)). Recent Labs  Lab 02/28/21 0452 02/28/21 0645 02/28/21 1244 02/28/21 1636 02/28/21 2345 03/01/21 0415 03/01/21 0542  PROCALCITON  --   --  <0.10  --   --  <0.10  --   WBC 10.5  --   --   --   --  10.4  --   LATICACIDVEN  --    < > 4.4* 3.3* 3.0* 5.3* 1.5   < > = values in this interval not displayed.    Liver Function Tests: Recent Labs  Lab 02/28/21 0452  AST 15  ALT 11  ALKPHOS 98  BILITOT 1.5*  PROT 6.9  ALBUMIN 3.8   No results for input(s): LIPASE, AMYLASE in the last 168 hours. No results for input(s): AMMONIA in the last 168 hours.  ABG    Component Value Date/Time   PHART 7.46 (H) 06/23/2020 0906   PCO2ART 39 06/23/2020 0906   PO2ART 59 (L) 06/23/2020 0906   HCO3 25.8 02/04/2021 1701   ACIDBASEDEF 1.0 02/04/2021 1701   O2SAT 57.5 02/04/2021 1701     Coagulation Profile: No results for  input(s): INR, PROTIME in the last 168 hours.  Cardiac Enzymes: No results for input(s): CKTOTAL, CKMB, CKMBINDEX, TROPONINI in the last 168 hours.  HbA1C: Hemoglobin A1C  Date/Time Value Ref Range Status  03/03/2016 12:00 AM 7.1%  Final  07/16/2014 04:40 AM 9.7 (H) 4.2 - 6.3 % Final    Comment:    The American Diabetes Association recommends that a primary goal of therapy should be <7% and that physicians should reevaluate the treatment regimen in patients with HbA1c values consistently >8%.    HB A1C (BAYER DCA - WAIVED)  Date/Time Value Ref Range Status  02/04/2021 03:11 PM >14.0 (H) <7.0 % Final    Comment:                                          Diabetic Adult            <7.0  Healthy Adult        4.3 - 5.7                                                           (DCCT/NGSP) American Diabetes Association's Summary of Glycemic Recommendations for Adults with Diabetes: Hemoglobin A1c <7.0%. More stringent glycemic goals (A1c <6.0%) may further reduce complications at the cost of increased risk of hypoglycemia.   11/26/2020 11:49 AM 13.9 (H) <7.0 % Final    Comment:                                          Diabetic Adult            <7.0                                       Healthy Adult        4.3 - 5.7                                                           (DCCT/NGSP) American Diabetes Association's Summary of Glycemic Recommendations for Adults with Diabetes: Hemoglobin A1c <7.0%. More stringent glycemic goals (A1c <6.0%) may further reduce complications at the cost of increased risk of hypoglycemia.     CBG: Recent Labs  Lab 02/28/21 1943 02/28/21 2105 02/28/21 2332 03/01/21 0533 03/01/21 0742  GLUCAP 138* 154* 154* 183* 177*    Allergies Allergies  Allergen Reactions   Gabapentin Other (See Comments)    "made me fall out"     Home Medications  Prior to Admission medications   Medication Sig Start Date End  Date Taking? Authorizing Provider  albuterol (VENTOLIN HFA) 108 (90 Base) MCG/ACT inhaler Inhale 2 puffs into the lungs every 6 (six) hours as needed for wheezing or shortness of breath. 02/04/21  Yes Cannady, Jolene T, NP  aspirin EC 81 MG tablet Take 81 mg by mouth daily. Swallow whole.   Yes [provider]  atorvastatin (LIPITOR) 80 MG tablet TAKE 1 TABLET EVERY DAY 12/26/20  Yes Cannady, Jolene T, NP  fluticasone (FLONASE) 50 MCG/ACT nasal spray Place 2 sprays into both nostrils daily as needed.    Yes [provider]  insulin glargine (LANTUS) 100 UNIT/ML injection Inject 0.35 mLs (35 Units total) into the skin every evening. 1800 02/04/21  Yes Cannady, Jolene T, NP  insulin lispro (HUMALOG KWIKPEN) 100 UNIT/ML KwikPen Inject 15 Units into the skin 3 (three) times daily before meals. Continue to check blood sugar after meals. Patient taking differently: Inject 15 Units into the skin in the morning and at bedtime. Continue to check blood sugar after meals. 02/05/21  Yes Sharen Hones, MD  lisinopril (ZESTRIL) 5 MG tablet Take 5 mg by mouth daily.   Yes [provider]  metFORMIN (GLUCOPHAGE) 500 MG tablet Take 2 tablets (1,000 mg total) by mouth 2 (two) times daily with a meal. 1077m in  the morning 1076m at night 05/17/20  Yes Cannady, Jolene T, NP  Omega-3 Fatty Acids (OMEGA 3 500 PO) Take 500 mg by mouth daily.   Yes [provider]  omeprazole (PRILOSEC) 20 MG capsule Take 1 capsule (20 mg total) by mouth daily. 01/14/21  Yes Cannady, JHenrine ScrewsT, NP  tamsulosin (FLOMAX) 0.4 MG CAPS capsule TAKE 1 CAPSULE EVERY DAY 07/31/20  Yes Cannady, JBarbaraann Faster NP  TRUE METRIX BLOOD GLUCOSE TEST test strip CHECK BLOOD SUGAR TWICE DAILY 11/01/20   CMarnee GuarneriT, NP  TRUEplus Lancets 33G MISC CHECK BLOOD SUGAR TWICE DAILY 10/18/20   CMarnee GuarneriT, NP  Scheduled Meds:  aspirin EC  81 mg Oral Daily   atorvastatin  80 mg Oral Daily   Chlorhexidine Gluconate Cloth  6 each Topical  Daily   heparin  5,000 Units Subcutaneous Q8H   insulin aspart  0-15 Units Subcutaneous TID WC   insulin aspart  0-5 Units Subcutaneous QHS   insulin detemir  0.3 Units/kg Subcutaneous Q24H   midodrine  10 mg Oral TID WC   omega-3 acid ethyl esters  1,000 mg Oral Daily   pantoprazole  40 mg Oral Daily   sodium chloride flush  10-40 mL Intracatheter Q12H   tamsulosin  0.4 mg Oral Daily   Continuous Infusions:  ceFEPime (MAXIPIME) IV 2 g (03/01/21 0831)   lactated ringers Stopped (02/28/21 2339)   norepinephrine (LEVOPHED) Adult infusion 7 mcg/min (03/01/21 0756)   potassium PHOSPHATE IVPB (in mmol) 85 mL/hr at 03/01/21 0756   PRN Meds:.albuterol, dextrose, fluticasone, sodium chloride flush  Resolved Hospital Problem list   Lactic acidosis Hyponatremia HHS  ASSESSMENT & PLAN  Shock, [facility]  Septic due to ? Pneumonia Mildly elevated Troponin, suspect Demand Ischemia PMHx of Hypertension, Hyperlipidemia -Continuous cardiac monitoring -Maintain MAP >65 -Aggressive IV fluids (received 4L IVF boluses in ED) -Monitor fever curve -Trend WBC's & Procalcitonin -Follow cultures as above -Continue empiric Cefepime and Vancomycin pending cultures & sensitivities -Vasopressors as needed to maintain MAP goal -Continue Midodrine -Hold home antihypertensives   Acute Kidney Injury Lactic Acidosis now resolved Hypertonic Hyponatremia due to severely elevated glucose now resolved -Monitor I&O's / urinary output -Follow BMP -Ensure adequate renal perfusion -Avoid nephrotoxic agents as able -Replace electrolytes as indicated -IV fluids  PMHx of Diabetes Mellitus Type II Presenting with Hyperosmolar Hyperglycemic State- Now resolved -Transitioned off insulin gtt -CBGs -Sliding scale insulin -Follow ICU hyper/hypoglycemia protocol -Hold home Meds for now -Diabetes Coordinator consulted, appreciate input    Best practice (right click and "Reselect all  SmartList Selections" daily)  Diet:  Oral Pain/Anxiety/Delirium protocol (if indicated): No VAP protocol (if indicated): Not indicated DVT prophylaxis: Subcutaneous Heparin GI prophylaxis: PPI Glucose control:  SSI Yes Central venous access:  Yes, Still indicated Arterial line:  N/A Foley:  N/A Mobility:  bed rest  PT consulted: N/A Last date of multidisciplinary goals of care discussion [9/17] Code Status:  full code Disposition: ICU  Critical care time: 3[place] DNP, FNP-C, AGACNP-BC Acute Care Nurse Practitioner  LSchleyPulmonary & Critical Care Medicine Pager: [phone] Colonat [facility]

## 2021-03-01 NOTE — Progress Notes (Signed)
Initial Nutrition Assessment  DOCUMENTATION CODES:   Not applicable  INTERVENTION:   Nepro Shake po TID, each supplement provides 425 kcal and 19 grams protein  MVI po daily   Pt at high refeed risk; recommend monitor potassium, magnesium and phosphorus labs daily until stable  NUTRITION DIAGNOSIS:   Unintentional weight loss related to chronic illness as evidenced by 24 percent weight loss in 8 months.  GOAL:   Patient will meet greater than or equal to 90% of their needs  MONITOR:   PO intake, Supplement acceptance, Labs, Weight trends, Skin, I & O's  REASON FOR ASSESSMENT:   Consult Assessment of nutrition requirement/status  ASSESSMENT:   80 year old male with a past medical history significant for insulin-dependent type 2 diabetes mellitus, hypertension, hyperlipidemia, BPH, COVID 19 (Jan 2022) and GERD who [facility] status post 2 falls at home and was found to have possible PNA, HHS and AKI.  RD working remotely.  Unable to reach pt by phone. Per chart review, pt with a gradual decline in his health since having COVID 19 in January. Per chart, pt is down 53lbs(24%) since January; this is severe weight loss. RD suspects pt with poor appetite and oral intake pta. Per chart, pt documented to be eating 70% of meals in hospital today. RD will add supplements and MVI to help pt meet his estimated needs. Pt is currently refeeding; electrolytes being monitored and repleted. RD will follow up to obtain NFPE, nutrition related history and to provide diet education in person next week.   Medications reviewed and include: aspirin, azithromycin, heparin, solu-cortef, insulin, lovaza, protonix, cefepime, LRS '@125ml'$ /hr, levophed, vancomycin   Labs reviewed: K 3.7 wnl, creat 1.28(H), P 1.9(L), Mg 1.7 wnl Cbgs- 183, 177, 311 x 24 hrs AIC 8.4(H)- 06/2020  NUTRITION - FOCUSED PHYSICAL EXAM: Unable to perform at this time   Diet Order:   Diet Order              Diet Carb Modified Fluid consistency: Thin; Room service appropriate? Yes  Diet effective now                  EDUCATION NEEDS:   Not appropriate for education at this time  Skin:  Skin Assessment: Reviewed RN Assessment  Last BM:  9/16  Height:   Ht Readings from Last 1 Encounters:  02/28/21 '5\' 9"'$  (1.753 m)    Weight:   Wt Readings from Last 1 Encounters:  02/28/21 78 kg    Ideal Body Weight:  72.7 kg  BMI:  Body mass index is 25.39 kg/m.  Estimated Nutritional Needs:   Kcal:  2000-2300kcal/day  Protein:  100-115g/day  Fluid:  1.8-2.1L/day  Koleen Distance MS, RD, LDN Please refer to [facility] for RD and/or RD on-call/weekend/after hours pager

## 2021-03-02 DIAGNOSIS — W19XXXA Unspecified fall, initial encounter: Secondary | ICD-10-CM

## 2021-03-02 DIAGNOSIS — A419 Sepsis, unspecified organism: Secondary | ICD-10-CM | POA: Diagnosis not present

## 2021-03-02 DIAGNOSIS — J189 Pneumonia, unspecified organism: Secondary | ICD-10-CM | POA: Diagnosis not present

## 2021-03-02 DIAGNOSIS — N179 Acute kidney failure, unspecified: Secondary | ICD-10-CM | POA: Diagnosis not present

## 2021-03-02 DIAGNOSIS — G9341 Metabolic encephalopathy: Secondary | ICD-10-CM

## 2021-03-02 DIAGNOSIS — E1165 Type 2 diabetes mellitus with hyperglycemia: Secondary | ICD-10-CM | POA: Diagnosis not present

## 2021-03-02 DIAGNOSIS — E1100 Type 2 diabetes mellitus with hyperosmolarity without nonketotic hyperglycemic-hyperosmolar coma (NKHHC): Secondary | ICD-10-CM | POA: Diagnosis not present

## 2021-03-02 DIAGNOSIS — Z9181 History of falling: Secondary | ICD-10-CM

## 2021-03-02 DIAGNOSIS — R739 Hyperglycemia, unspecified: Secondary | ICD-10-CM | POA: Diagnosis not present

## 2021-03-02 DIAGNOSIS — R579 Shock, unspecified: Secondary | ICD-10-CM

## 2021-03-02 LAB — CBC WITH DIFFERENTIAL/PLATELET
Abs Immature Granulocytes: 0.05 10*3/uL (ref 0.00–0.07)
Basophils Absolute: 0 10*3/uL (ref 0.0–0.1)
Basophils Relative: 0 %
Eosinophils Absolute: 0 10*3/uL (ref 0.0–0.5)
Eosinophils Relative: 0 %
HCT: 37.7 % — ABNORMAL LOW (ref 39.0–52.0)
Hemoglobin: 12.8 g/dL — ABNORMAL LOW (ref 13.0–17.0)
Immature Granulocytes: 1 %
Lymphocytes Relative: 20 %
Lymphs Abs: 1.9 10*3/uL (ref 0.7–4.0)
MCH: 31 pg (ref 26.0–34.0)
MCHC: 34 g/dL (ref 30.0–36.0)
MCV: 91.3 fL (ref 80.0–100.0)
Monocytes Absolute: 0.7 10*3/uL (ref 0.1–1.0)
Monocytes Relative: 7 %
Neutro Abs: 7.1 10*3/uL (ref 1.7–7.7)
Neutrophils Relative %: 72 %
Platelets: 195 10*3/uL (ref 150–400)
RBC: 4.13 MIL/uL — ABNORMAL LOW (ref 4.22–5.81)
RDW: 12.9 % (ref 11.5–15.5)
WBC: 9.7 10*3/uL (ref 4.0–10.5)
nRBC: 0 % (ref 0.0–0.2)

## 2021-03-02 LAB — BASIC METABOLIC PANEL
Anion gap: 7 (ref 5–15)
BUN: 20 mg/dL (ref 8–23)
CO2: 28 mmol/L (ref 22–32)
Calcium: 8.9 mg/dL (ref 8.9–10.3)
Chloride: 105 mmol/L (ref 98–111)
Creatinine, Ser: 1.14 mg/dL (ref 0.61–1.24)
GFR, Estimated: >60 mL/min (ref >60)
Glucose, Bld: 144 mg/dL — ABNORMAL HIGH (ref 70–99)
Potassium: 4.2 mmol/L (ref 3.5–5.1)
Sodium: 140 mmol/L (ref 135–145)

## 2021-03-02 LAB — RPR: RPR Ser Ql: NONREACTIVE

## 2021-03-02 LAB — PROCALCITONIN: Procalcitonin: 0.11 ng/mL

## 2021-03-02 LAB — FOLATE: Folate: 7.5 ng/mL (ref >5.9)

## 2021-03-02 LAB — GLUCOSE, CAPILLARY
Glucose-Capillary: 158 mg/dL — ABNORMAL HIGH (ref 70–99)
Glucose-Capillary: 91 mg/dL (ref 70–99)
Glucose-Capillary: 238 mg/dL — ABNORMAL HIGH (ref 70–99)
Glucose-Capillary: 255 mg/dL — ABNORMAL HIGH (ref 70–99)
Glucose-Capillary: 279 mg/dL — ABNORMAL HIGH (ref 70–99)

## 2021-03-02 LAB — PHOSPHORUS: Phosphorus: 2.5 mg/dL (ref 2.5–4.6)

## 2021-03-02 LAB — MAGNESIUM: Magnesium: 1.8 mg/dL (ref 1.7–2.4)

## 2021-03-02 MED ORDER — INSULIN ASPART 100 UNIT/ML IJ SOLN
0.0000 [IU] | Freq: Three times a day (TID) | INTRAMUSCULAR | Status: DC
  Administered 2021-03-02: 11 [IU] via SUBCUTANEOUS
  Administered 2021-03-02: 7 [IU] via SUBCUTANEOUS
  Filled 2021-03-02 (×2): qty 1

## 2021-03-02 MED ORDER — INSULIN ASPART 100 UNIT/ML IJ SOLN
0.0000 [IU] | Freq: Three times a day (TID) | INTRAMUSCULAR | Status: DC
  Administered 2021-03-02: 11 [IU] via SUBCUTANEOUS
  Administered 2021-03-03: 7 [IU] via SUBCUTANEOUS
  Administered 2021-03-03: 15 [IU] via SUBCUTANEOUS
  Administered 2021-03-03: 3 [IU] via SUBCUTANEOUS
  Administered 2021-03-03 – 2021-03-04 (×2): 7 [IU] via SUBCUTANEOUS
  Administered 2021-03-04: 15 [IU] via SUBCUTANEOUS
  Filled 2021-03-02 (×7): qty 1

## 2021-03-02 MED ORDER — INSULIN GLARGINE-YFGN 100 UNIT/ML ~~LOC~~ SOLN
25.0000 [IU] | Freq: Every day | SUBCUTANEOUS | Status: DC
  Administered 2021-03-03: 25 [IU] via SUBCUTANEOUS
  Filled 2021-03-02: qty 0.25

## 2021-03-02 MED ORDER — INSULIN GLARGINE-YFGN 100 UNIT/ML ~~LOC~~ SOLN
15.0000 [IU] | Freq: Every day | SUBCUTANEOUS | Status: DC
  Filled 2021-03-02: qty 0.15

## 2021-03-02 MED ORDER — INSULIN GLARGINE-YFGN 100 UNIT/ML ~~LOC~~ SOLN: 15.0000 [IU] | Freq: Two times a day (BID) | SUBCUTANEOUS | Status: DC

## 2021-03-02 MED ORDER — INSULIN ASPART 100 UNIT/ML IJ SOLN: 0.0000 [IU] | Freq: Every day | INTRAMUSCULAR | Status: DC

## 2021-03-02 MED ORDER — INSULIN DETEMIR 100 UNIT/ML ~~LOC~~ SOLN
15.0000 [IU] | Freq: Two times a day (BID) | SUBCUTANEOUS | Status: DC
  Administered 2021-03-02: 15 [IU] via SUBCUTANEOUS
  Filled 2021-03-02 (×2): qty 0.15

## 2021-03-02 NOTE — Evaluation (Signed)
Physical Therapy Evaluation Patient Details Name: Scott Lawson. MRN: [id number] DOB: December 07, 1940 Today's Date: 03/02/2021  History of Present Illness  80 y.o. Male admitted with HHS, multifactorial shock (hypovolemic vs. Septic), questionable pneumonia, and Acute Kidney Injury. He has a PMHx significant for T2DM, BPH, GERD, HTN, HLD.  Clinical Impression  The pt presents this session in great spirits. He demonstrates some balance and gait impairments this session that he reports is not his baseline. The pt is also presenting with vitals that are consistent with orthostatic hTN, however is not reporting any s/s. At this time the pt would benefit from continued skilled PT in order to optimize mobility and address deficits. He would also benefit from true orthostatic BP testing in order to determine if this corresponds with his fall hx. Recommend HHPT       Recommendations for follow up therapy are one component of a multi-disciplinary discharge planning process, led by the attending physician.  Recommendations may be updated based on patient status, additional functional criteria and insurance authorization.  Follow Up Recommendations Home health PT;Supervision/Assistance - 24 hour    Equipment Recommendations  Rolling walker with 5" wheels    Recommendations for Other Services       Precautions / Restrictions Precautions Precautions: Fall Restrictions Weight Bearing Restrictions: No      Mobility  Bed Mobility Overal bed mobility: Needs Assistance Bed Mobility: Supine to Sit     Supine to sit: Min assist;HOB elevated     General bed mobility comments: HHA for pulling up to sitting at the EOB.    Transfers Overall transfer level: Needs assistance Equipment used: 1 person hand held assist Transfers: Sit to/from Stand Sit to Stand: Min assist            Ambulation/Gait Ambulation/Gait assistance: Min guard Gait Distance (Feet): 50 Feet Assistive device:  Rolling walker (2 wheeled) Gait Pattern/deviations: Narrow base of support     General Gait Details: Initially began ambulation with HHA, but progressed to use of RW when pt reported need for more safety, although initially reporting not needing RW.  Stairs            Wheelchair Mobility    Modified Rankin (Stroke Patients Only)       Balance Overall balance assessment: Needs assistance   Sitting balance-Leahy Scale: Normal       Standing balance-Leahy Scale: Fair Standing balance comment: Pt demonstrating need for assistance in order to maintain mobility during functional activity.                             Pertinent Vitals/Pain Pain Assessment: No/denies pain    Home Living Family/patient expects to be discharged to:: Private residence Living Arrangements: Other relatives Available Help at Discharge: Family;Available PRN/intermittently;Available 24 hours/day Type of Home: House       Home Layout: Two level;Able to live on main level with bedroom/bathroom Home Equipment: Kasandra Knudsen - single point      Prior Function Level of Independence: Independent with assistive device(s)         Comments: Mod indep with SPC for ADLs, household mobilization; 2 falls at home. pt reports falling when he moves too fast.     Hand Dominance   Dominant Hand: Right    Extremity/Trunk Assessment   Upper Extremity Assessment Upper Extremity Assessment: Overall WFL for tasks assessed    Lower Extremity Assessment Lower Extremity Assessment: Overall WFL for tasks assessed  Communication   Communication: No difficulties  Cognition Arousal/Alertness: Awake/alert Behavior During Therapy: WFL for tasks assessed/performed Overall Cognitive Status: No family/caregiver present to determine baseline cognitive functioning                                        General Comments      Exercises Other Exercises Other Exercises: Discussed  prior fall history with RN. D/t BP cuff unable to complete full orthostatics this session, but pt would benefit from true orthostatic testing in order to determine if his fall hx is 2/2 orthostatic hTN. Pt reporting that falls are d/t not resting at EOB. BP taken today at EOB fall within standard for orthostatic hTN, however pt reports no symptoms this session.   Assessment/Plan    PT Assessment Patient needs continued PT services  PT Problem List Decreased strength;Decreased mobility;Decreased balance       PT Treatment Interventions Gait training;Stair training;Functional mobility training;Balance training    PT Goals (Current goals can be found in the Care Plan section)  Acute Rehab PT Goals Patient Stated Goal: to return home with granddaughter PT Goal Formulation: With patient Time For Goal Achievement: 03/16/21 Potential to Achieve Goals: Good    Frequency Min 2X/week   Barriers to discharge        Co-evaluation               AM-PAC PT "6 Clicks" Mobility  Outcome Measure Help needed turning from your back to your side while in a flat bed without using bedrails?: A Little Help needed moving from lying on your back to sitting on the side of a flat bed without using bedrails?: A Little Help needed moving to and from a bed to a chair (including a wheelchair)?: A Little Help needed standing up from a chair using your arms (e.g., wheelchair or bedside chair)?: A Little Help needed to walk in hospital room?: A Little Help needed climbing 3-5 steps with a railing? : A Lot 6 Click Score: 17    End of Session Equipment Utilized During Treatment: Gait belt Activity Tolerance: Patient tolerated treatment well Patient left: in bed;with bed alarm set;with nursing/sitter in room;with call bell/phone within reach Nurse Communication: Mobility status PT Visit Diagnosis: Unsteadiness on feet (R26.81);Muscle weakness (generalized) (M62.81);History of falling (Z91.81)    Time:  [id number] PT Time Calculation (min) (ACUTE ONLY): 33 min   Charges:   PT Evaluation $PT Eval Moderate Complexity: 1 Mod PT Treatments $Gait Training: 23-37 mins        3:12 PM, 03/02/21 Scott Lawson A. Scott Lawson PT, DPT Physical Therapist - [facility]   Scott Lawson A Marysa Wessner 03/02/2021, 3:10 PM

## 2021-03-02 NOTE — Plan of Care (Signed)
  Problem: Education: Goal: Knowledge of General Education information will improve Description: Including pain rating scale, medication(s)/side effects and non-pharmacologic comfort measures Outcome: Progressing   Problem: Health Behavior/Discharge Planning: Goal: Ability to manage health-related needs will improve Outcome: Progressing   Problem: Clinical Measurements: Goal: Ability to maintain clinical measurements within normal limits will improve Outcome: Progressing Goal: Will remain free from infection Outcome: Progressing Goal: Diagnostic test results will improve Outcome: Progressing Goal: Respiratory complications will improve Outcome: Progressing Goal: Cardiovascular complication will be avoided Outcome: Progressing   Problem: Activity: Goal: Risk for activity intolerance will decrease Outcome: Progressing   Problem: Nutrition: Goal: Adequate nutrition will be maintained Outcome: Progressing   Problem: Elimination: Goal: Will not experience complications related to bowel motility Outcome: Progressing Goal: Will not experience complications related to urinary retention Outcome: Progressing   Problem: Pain Managment: Goal: General experience of comfort will improve Outcome: Progressing   Problem: Safety: Goal: Ability to remain free from injury will improve Outcome: Progressing   Problem: Skin Integrity: Goal: Risk for impaired skin integrity will decrease Outcome: Progressing   Problem: Health Behavior/Discharge Planning: Goal: Ability to identify and utilize available resources and services will improve Outcome: Progressing Goal: Ability to manage health-related needs will improve Outcome: Progressing   Problem: Metabolic: Goal: Ability to maintain appropriate glucose levels will improve Outcome: Progressing

## 2021-03-02 NOTE — Progress Notes (Addendum)
[facility] PROGRESS NOTE   Scott Lawson. TO:[id number] DOB: [birth year] DOA: 02/28/2021  PCP: Scott Lick, NP  Brief History/Interval Summary: 80 y.o. male with a past medical history of insulin-dependent diabetes mellitus type 2, hypertension, dyslipidemia, BPH, GERD.  Presented to the emergency department after sustaining 2 falls at home.  Did not want to come to the hospital after his initial fall.  Patient noted to be a poor historian.  He was found to have severe hyperglycemia requiring IV insulin infusion.  Subsequently became hypotensive with concern for sepsis from pneumonia.  He was admitted to the ICU.  Seen by critical care medicine.  Started on broad-spectrum antibiotics.  Required initiation of vasopressors as well.  Consultants: Critical care medicine  Procedures: PICC line placement.   Central line placement (patient pulled out central line)  Antibiotics: Anti-infectives (From admission, onward)    Start     Dose/Rate Route Frequency Ordered Stop   03/01/21 1430  vancomycin (VANCOREADY) IVPB 1250 mg/250 mL        1,250 mg 166.7 mL/hr over 90 Minutes Intravenous Every 24 hours 03/01/21 1341     03/01/21 1145  azithromycin (ZITHROMAX) tablet 500 mg        500 mg Oral Daily 03/01/21 1057 03/06/21 0959   02/28/21 2200  ceFEPIme (MAXIPIME) 2 g in sodium chloride 0.9 % 100 mL IVPB        2 g 200 mL/hr over 30 Minutes Intravenous Every 12 hours 02/28/21 1841     02/28/21 1300  vancomycin (VANCOREADY) IVPB 750 mg/150 mL       See Hyperspace for full Linked Orders Report.   750 mg 150 mL/hr over 60 Minutes Intravenous  Once 02/28/21 1159 02/28/21 1414   02/28/21 1200  ceFEPIme (MAXIPIME) 2 g in sodium chloride 0.9 % 100 mL IVPB        2 g 200 mL/hr over 30 Minutes Intravenous  Once 02/28/21 1159 02/28/21 1316   02/28/21 1200  vancomycin (VANCOCIN) IVPB 1000 mg/200 mL premix       See Hyperspace for full Linked Orders Report.   1,000 mg 200 mL/hr over  60 Minutes Intravenous  Once 02/28/21 1159 02/28/21 1636       Subjective/Interval History: Patient seems to be much more awake and alert this morning.  Answering questions more appropriately.  Denies any pain issues.  Denies any difficulty breathing currently.  He prefers that we speak only to his granddaughter Scott Lawson at [id number].  According to him she is the one who makes most decisions for his care.  Patient has not assigned anybody else in the family has is healthcare power of attorney.     Assessment/Plan:  Shock, septic versus hypovolemic versus combination/questionable pneumonia/acute respiratory failure with hypoxia Hypotension was refractory to aggressive IV fluid hydration.  Subsequently started on vasopressors.  Remains on Levophed.  Critical care medicine is following.  Lactic acid level peaked at 5.3.  Improved to 1.5.   Concern raised for pneumonia on imaging studies. His procalcitonin is 0.11.  WBC is normal.  Cultures have been negative so far.  MRSA PCR is nondetected. Will discontinue vancomycin.  Continue cefepime and azithromycin. Being weaned off of his Levophed.  Low blood pressures noted though he is asymptomatic.  Patient noted to be on midodrine.  Looks like he was also started on stress dose steroids yesterday by critical care medicine. Continue to wean him off of oxygen.  Acute kidney injury Presented with a creatinine  of 2.13.  Baseline is around 1.1.  Likely due to combination of sepsis and hypovolemia.  Was aggressively hydrated.  Renal function is back to baseline.  Monitor urine output.    Hyperosmolar hyperglycemic state/history of insulin-dependent diabetes mellitus, uncontrolled Presented with a significantly elevated glucose levels of greater than 700.  Unclear if this is due to noncompliance or not.  Placed on insulin infusion.  Subsequently transitioned to Levemir.  HbA1c greater than 14 implying very poor control and likely noncompliance. CBGs have  improved.  Currently on 15 units of Levemir twice a day.  Continue to monitor closely.  May need to adjust dose depending on glucose levels.    Acute metabolic encephalopathy Patient likely has underlying cognitive issues.  Chronic ischemic changes noted on CT head.  TSH noted to be normal.  Vitamin B12 was 470.  Folate 7.5.  RPR is pending.  No focal neurological deficits.  His mentation appears to have improved.  He is much more alert and oriented today.   Essential hypertension As discussed above patient with septic shock.  Antihypertensives on hold.  Dyslipidemia Continue Lipitor.  History of GERD Continue Protonix.  History of BPH Noted to be on tamsulosin which is being continued.  Hypophosphatemia Potassium phosphate was given.  Improvement in phosphorus levels noted.  Magnesium is 1.8.  Normocytic anemia Some drop in hemoglobin is likely dilutional.  No overt bleeding noted.  Continue to monitor.  Physical deconditioning with falls at home No injuries noted on imaging studies.  He is able to move his extremities without problem.  PT and OT evaluation when more stable.   DVT Prophylaxis: Subcutaneous heparin Code Status: Full code Family Communication: No family at bedside.  Patient prefers that we speak to Scott Lawson who is his granddaughter.  [phone]. Disposition Plan: To be determined.  Will need PT and OT evaluation due to his history of falls.  Status is: Inpatient  Remains inpatient appropriate because:Hemodynamically unstable, IV treatments appropriate due to intensity of illness or inability to take PO, and Inpatient level of care appropriate due to severity of illness  Dispo: The patient is from: Home              Anticipated d/c is to: Home              Patient currently is not medically stable to d/c.   Difficult to place patient No      Medications: Scheduled:  aspirin EC  81 mg Oral Daily   atorvastatin  80 mg Oral Daily   azithromycin  500 mg Oral  Daily   Chlorhexidine Gluconate Cloth  6 each Topical Daily   feeding supplement (NEPRO CARB STEADY)  237 mL Oral TID BM   heparin  5,000 Units Subcutaneous Q8H   hydrocortisone sod succinate (SOLU-CORTEF) inj  100 mg Intravenous Q8H   insulin aspart  0-20 Units Subcutaneous Q4H   insulin detemir  15 Units Subcutaneous BID   midodrine  10 mg Oral TID WC   multivitamin with minerals  1 tablet Oral Daily   omega-3 acid ethyl esters  1,000 mg Oral Daily   pantoprazole  40 mg Oral Daily   sodium chloride flush  10-40 mL Intracatheter Q12H   tamsulosin  0.4 mg Oral Daily   Continuous:  ceFEPime (MAXIPIME) IV 2 g (03/02/21 0840)   lactated ringers 75 mL/hr at 03/02/21 0830   norepinephrine (LEVOPHED) Adult infusion 3 mcg/min (03/02/21 0717)   vancomycin 166.7 mL/hr at 03/01/21 1419  Objective:  Vital Signs  Vitals:   03/02/21 0530 03/02/21 0600 03/02/21 0700 03/02/21 0800  BP: 114/63 100/72 104/83 95/79  Pulse: (!) 53 (!) 56 (!) 53 97  Resp: 16 (!) 25 14 (!) 23  Temp:    97.9 F (36.6 C)  TempSrc:    Oral  SpO2: 99% 98% 95% 96%  Weight:      Height:        Intake/Output Summary (Last 24 hours) at 03/02/2021 0934 Last data filed at 03/02/2021 [id number] Gross per 24 hour  Intake 5460.58 ml  Output 3675 ml  Net 1785.58 ml    Filed Weights   02/28/21 0444  Weight: 78 kg   General appearance: Awake alert.  In no distress.  Less distracted today. Resp: Normal effort at rest.  Coarse breath sound bilaterally.  Few crackles at the bases.  No wheezing or rhonchi.   Cardio: S1-S2 is normal regular.  No S3-S4.  No rubs murmurs or bruit GI: Abdomen is soft.  Nontender nondistended.  Bowel sounds are present normal.  No masses organomegaly Extremities: No edema.  Full range of motion of lower extremities. Neurologic: Much more awake and alert today.  Better oriented today.  No focal neurological deficits.   Lab Results:  Data Reviewed: I have personally reviewed following labs  and imaging studies  CBC: Recent Labs  Lab 02/28/21 0452 03/01/21 0415 03/02/21 0326  WBC 10.5 10.4 9.7  NEUTROABS 5.4 7.0 7.1  HGB 15.2 11.0* 12.8*  HCT 44.3 32.4* 37.7*  MCV 89.7 92.8 91.3  PLT 259 176 195     Basic Metabolic Panel: Recent Labs  Lab 02/28/21 1643 02/28/21 2124 03/01/21 0415 03/01/21 0542 03/02/21 0326  NA 132* 133* 132* 137 140  K 3.1* 3.7 3.6 3.7 4.2  CL 96* 100 102 105 105  CO2 '26 24 23 27 28  '$ GLUCOSE 188* 149* 295* 173* 144*  BUN 24* '21 15 18 20  '$ CREATININE 1.60* 1.36* 1.01 1.28* 1.14  CALCIUM 9.0 8.8* 7.9* 8.8* 8.9  MG  --   --  1.3* 1.7 1.8  PHOS  --   --  1.4* 1.9* 2.5     GFR: Estimated Creatinine Clearance: 52.5 mL/min (by C-G formula based on SCr of 1.14 mg/dL).  Liver Function Tests: Recent Labs  Lab 02/28/21 0452  AST 15  ALT 11  ALKPHOS 98  BILITOT 1.5*  PROT 6.9  ALBUMIN 3.8       CBG: Recent Labs  Lab 03/01/21 1944 03/01/21 2213 03/01/21 2349 03/02/21 0353 03/02/21 0730  GLUCAP 371* 296* 264* 158* 91       Recent Results (from the past 240 hour(s))  Resp Panel by RT-PCR (Flu A&B, Covid) Nasopharyngeal Swab     Status: None   Collection Time: 02/28/21  4:52 AM   Specimen: Nasopharyngeal Swab; Nasopharyngeal(NP) swabs in vial transport medium  Result Value Ref Range Status   SARS Coronavirus 2 by RT PCR NEGATIVE NEGATIVE Final    Comment: (NOTE) SARS-CoV-2 target nucleic acids are NOT DETECTED.  The SARS-CoV-2 RNA is generally detectable in upper respiratory specimens during the acute phase of infection. The lowest concentration of SARS-CoV-2 viral copies this assay can detect is 138 copies/mL. A negative result does not preclude SARS-Cov-2 infection and should not be used as the sole basis for treatment or other patient management decisions. A negative result may occur with  improper specimen collection/handling, submission of specimen other than nasopharyngeal swab, presence of viral mutation(s)  within the areas targeted by this assay, and inadequate number of viral copies(<138 copies/mL). A negative result must be combined with clinical observations, patient history, and epidemiological information. The expected result is Negative.  Fact Sheet for Patients:  EntrepreneurPulse.com.au  Fact Sheet for Healthcare Providers:  IncredibleEmployment.be  This test is no t yet approved or cleared by the [place] FDA and  has been authorized for detection and/or diagnosis of SARS-CoV-2 by FDA under an Emergency Use Authorization (EUA). This EUA will remain  in effect (meaning this test can be used) for the duration of the COVID-19 declaration under Section 564(b)(1) of the Act, 21 U.S.C.section 360bbb-3(b)(1), unless the authorization is terminated  or revoked sooner.       Influenza A by PCR NEGATIVE NEGATIVE Final   Influenza B by PCR NEGATIVE NEGATIVE Final    Comment: (NOTE) The Xpert Xpress SARS-CoV-2/FLU/RSV plus assay is intended as an aid in the diagnosis of influenza from Nasopharyngeal swab specimens and should not be used as a sole basis for treatment. Nasal washings and aspirates are unacceptable for Xpert Xpress SARS-CoV-2/FLU/RSV testing.  Fact Sheet for Patients: EntrepreneurPulse.com.au  Fact Sheet for Healthcare Providers: IncredibleEmployment.be  This test is not yet approved or cleared by the [place] FDA and has been authorized for detection and/or diagnosis of SARS-CoV-2 by FDA under an Emergency Use Authorization (EUA). This EUA will remain in effect (meaning this test can be used) for the duration of the COVID-19 declaration under Section 564(b)(1) of the Act, 21 U.S.C. section 360bbb-3(b)(1), unless the authorization is terminated or revoked.  Performed at [facility], [address] [place], [place] [address]   Blood culture (routine x 2)     Status: None  (Preliminary result)   Collection Time: 02/28/21  6:40 AM   Specimen: BLOOD  Result Value Ref Range Status   Specimen Description BLOOD RIGHT ASSIST CONTROL  Final   Special Requests   Final    BOTTLES DRAWN AEROBIC AND ANAEROBIC Blood Culture adequate volume   Culture   Final    NO GROWTH 2 DAYS Performed at [facility], [address], [place], [place] [address]    Report Status PENDING  Incomplete  Blood culture (routine x 2)     Status: None (Preliminary result)   Collection Time: 02/28/21  6:45 AM   Specimen: BLOOD  Result Value Ref Range Status   Specimen Description BLOOD LEFT HAND  Final   Special Requests   Final    BOTTLES DRAWN AEROBIC AND ANAEROBIC Blood Culture adequate volume   Culture   Final    NO GROWTH 2 DAYS Performed at [facility], [address], [place], [place] [address]    Report Status PENDING  Incomplete  MRSA Next Gen by PCR, Nasal     Status: None   Collection Time: 02/28/21 12:15 PM   Specimen: Nasal Mucosa; Nasal Swab  Result Value Ref Range Status   MRSA by PCR Next Gen NOT DETECTED NOT DETECTED Final    Comment: (NOTE) The GeneXpert MRSA Assay (FDA approved for NASAL specimens only), is one component of a comprehensive MRSA colonization surveillance program. It is not intended to diagnose MRSA infection nor to guide or monitor treatment for MRSA infections. Test performance is not FDA approved in patients less than 45 years old. Performed at [facility], [address] [place], [place] [address]        Radiology Studies: CT HEAD WO CONTRAST (5MM)  Result  Date: 03/01/2021 CLINICAL DATA:  Head trauma EXAM: CT HEAD WITHOUT CONTRAST CT CERVICAL SPINE WITHOUT CONTRAST TECHNIQUE: Multidetector CT imaging of the head and cervical spine was performed following the standard protocol without intravenous contrast. Multiplanar CT image reconstructions of the cervical spine were also generated. COMPARISON:  None.  FINDINGS: CT HEAD FINDINGS Brain: There is no mass, hemorrhage or extra-axial collection. The size and configuration of the ventricles and extra-axial CSF spaces are normal. There is hypoattenuation of the periventricular white matter, most commonly indicating chronic ischemic microangiopathy. Vascular: No abnormal hyperdensity of the major intracranial arteries or dural venous sinuses. No intracranial atherosclerosis. Skull: The visualized skull base, calvarium and extracranial soft tissues are normal. Sinuses/Orbits: No fluid levels or advanced mucosal thickening of the visualized paranasal sinuses. No mastoid or middle ear effusion. The orbits are normal. CT CERVICAL SPINE FINDINGS Alignment: No static subluxation. Facets are aligned. Occipital condyles are normally positioned. Skull base and vertebrae: No acute fracture. Soft tissues and spinal canal: No prevertebral fluid or swelling. No visible canal hematoma. Disc levels: No advanced spinal canal or neural foraminal stenosis. Upper chest: No pneumothorax, pulmonary nodule or pleural effusion. Other: Normal visualized paraspinal cervical soft tissues. IMPRESSION: 1. Chronic ischemic microangiopathy without acute intracranial abnormality. 2. No acute fracture or static subluxation of the cervical spine. Electronically Signed   By: Ulyses Jarred M.D.   On: 03/01/2021 00:56   CT CHEST WO CONTRAST  Result Date: 02/28/2021 CLINICAL DATA:  Chest pain and shortness of breath. EXAM: CT CHEST WITHOUT CONTRAST TECHNIQUE: Multidetector CT imaging of the chest was performed following the standard protocol without IV contrast. COMPARISON:  CTA Chest 07/10/2020 FINDINGS: Cardiovascular: Heart size upper normal. Coronary artery calcification is evident. Moderate atherosclerotic calcification is noted in the wall of the thoracic aorta. Mediastinum/Nodes: No mediastinal lymphadenopathy. Stable 11 mm short axis AP window lymph node. Stable 11 mm short axis subcarinal lymph  node. No evidence for gross hilar lymphadenopathy although assessment is limited by the lack of intravenous contrast on the current study. The esophagus has normal imaging features. There is no axillary lymphadenopathy. Lungs/Pleura: Architectural distortion and scarring is seen through both lungs with areas of subpleural reticulation bilaterally. The diffuse ground-glass opacity seen previously has decreased substantially in the interval. Basilar predominant traction bronchiectasis is noted in the lungs bilaterally no overtly suspicious nodule or mass although assessment limited by the degree of scarring. No pleural effusion. Upper Abdomen: 15 mm left adrenal nodule has low density consistent with adenoma. Musculoskeletal: No worrisome lytic or sclerotic osseous abnormality. IMPRESSION: 1. Marked interval decrease in the diffuse ground-glass opacity seen previously. 2. Areas of architectural distortion and scarring bilaterally with subpleural reticulation in basilar predominant traction bronchiectasis. Underlying component of pulmonary fibrosis suspected. High-resolution chest CT may prove helpful to further evaluate as clinically warranted. 3. Stable mild mediastinal lymphadenopathy, likely reactive given the 9 month interval stability. 4. 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 5. Aortic Atherosclerosis (ICD10-I70.0). Electronically Signed   By: Misty Stanley M.D.   On: 02/28/2021 11:43   CT CERVICAL SPINE WO CONTRAST  Result Date: 03/01/2021 CLINICAL DATA:  Head trauma EXAM: CT HEAD WITHOUT CONTRAST CT CERVICAL SPINE WITHOUT CONTRAST TECHNIQUE: Multidetector CT imaging of the head and cervical spine was performed following the standard protocol without intravenous contrast. Multiplanar CT image reconstructions of the cervical spine were also generated. COMPARISON:  None. FINDINGS: CT HEAD FINDINGS Brain: There is no mass, hemorrhage or extra-axial collection. The size and configuration of the ventricles and extra-axial  CSF spaces are normal. There is hypoattenuation of the periventricular white matter, most commonly indicating chronic ischemic microangiopathy. Vascular: No abnormal hyperdensity of the major intracranial arteries or dural venous sinuses. No intracranial atherosclerosis. Skull: The visualized skull base, calvarium and extracranial soft tissues are normal. Sinuses/Orbits: No fluid levels or advanced mucosal thickening of the visualized paranasal sinuses. No mastoid or middle ear effusion. The orbits are normal. CT CERVICAL SPINE FINDINGS Alignment: No static subluxation. Facets are aligned. Occipital condyles are normally positioned. Skull base and vertebrae: No acute fracture. Soft tissues and spinal canal: No prevertebral fluid or swelling. No visible canal hematoma. Disc levels: No advanced spinal canal or neural foraminal stenosis. Upper chest: No pneumothorax, pulmonary nodule or pleural effusion. Other: Normal visualized paraspinal cervical soft tissues. IMPRESSION: 1. Chronic ischemic microangiopathy without acute intracranial abnormality. 2. No acute fracture or static subluxation of the cervical spine. Electronically Signed   By: Ulyses Jarred M.D.   On: 03/01/2021 00:56   [place] EKG SITE RITE  Result Date: 02/28/2021 If Site Rite image not attached, placement could not be confirmed due to current cardiac rhythm.      LOS: 2 days   [facility] Pager on [email or web address]  03/02/2021, 9:34 AM

## 2021-03-02 NOTE — Consult Note (Signed)
PHARMACY CONSULT NOTE - FOLLOW UP  Pharmacy Consult for Electrolyte Monitoring and Replacement   Recent Labs: Potassium (mmol/L)  Date Value  03/02/2021 4.2  07/17/2014 4.2   Magnesium (mg/dL)  Date Value  03/02/2021 1.8  07/16/2014 1.9   Calcium (mg/dL)  Date Value  03/02/2021 8.9   Calcium, Total (mg/dL)  Date Value  07/17/2014 8.5   Albumin (g/dL)  Date Value  02/28/2021 3.8  02/04/2021 3.9   Phosphorus (mg/dL)  Date Value  03/02/2021 2.5   Sodium (mmol/L)  Date Value  03/02/2021 140  02/04/2021 129 (L)  07/17/2014 136    Assessment: 80YOM PMH T2DM on insulin at home, HTN, HLD, presenting to the ED s/p 2 falls at home. He met criteria for HHS, therefore he was given 4 L of Lactated Ringer's and was placed on an insulin drip. Patient initially poorly responsive to fluid bolusing, requiring initiation of Levophed gtt and transfer to ICU for further management.  On LR 125 > 75 ml/hr.     Goal of Therapy:  K > 4 while on insulin gtt All others WNL  Plan:  No replacement needed at this time.  F/u with AM labs.   Eleonore Chiquito, PharmD, BCPS 03/02/2021 8:54 AM

## 2021-03-02 NOTE — Progress Notes (Signed)
NAME:  Scott Gingras., MRN:  [id number], DOB:  02/18/1941, LOS: 2 ADMISSION DATE:  02/28/2021, INITIAL CONSULTATION DATE:  02/28/2021 REFERRING MD: Dr. Rowe Pavy , CHIEF COMPLAINT:  Falls   Brief Patient Description  80 y.o. Male admitted with HHS, multifactorial shock (hypovolemic vs. Septic), questionable pneumonia, and Acute Kidney Injury.  He met criteria for HHS, therefore he was given 4 L of lactated Ringer's and was placed on an insulin drip.  Despite aggressive IV fluids he remained hypotensive with systolic blood pressures in the 80s to low 90s.  Given poor fluid responsiveness to IV fluids, he was placed on low-dose Levophed.  Concern is for possible developing septic shock due to potential pneumonia.   Pertinent  Medical History  Diabetes mellitus type 2 BPH GERD Hypertension Hyperlipidemia  Significant Hospital Events: Including procedures, antibiotic start and stop dates in addition to other pertinent events   Significant labs: Sodium 125, chloride 87, glucose 755, BUN 29, creatinine 2.13, high-sensitivity troponin 39, lactic acid 2.4, WBC 10.5, serum osmolality 306 Urinalysis is negative for UTI, and negative for ketones COVID-19 PCR is negative Imaging: CT head without contrast 09/17>>Atrophy, chronic microvascular disease. No acute intracranial abnormality. CT cervical spine 09/17>>Degenerative disc and facet disease.  No acute bony abnormality. Chest x-ray 09/16>>Patient rotated to the right. The cardio pericardial silhouette is enlarged. Interstitial markings are diffusely coarsened with chronic features. Superimpose patchy airspace disease noted bilaterally, right greater than left. No substantial pleural effusion. The visualized bony structures of the thorax show no acute abnormality. Telemetry leads overlie the chest. CT chest without contrast 09/16>>Marked interval decrease in the diffuse ground-glass opacity seen previously. Areas of architectural distortion and  scarring bilaterally with subpleural reticulation in basilar predominant traction bronchiectasis. Underlying component of pulmonary fibrosis suspected. High-resolution chest CT may prove helpful to further evaluate as clinically warranted. Stable mild mediastinal lymphadenopathy, likely reactive given the 9 month interval stability. 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 Aortic Atherosclerosis  Cultures:  SARS-CoV-2 PCR>> negative Influenza PCR>> negative Blood culture x2>>No growth thus far Urine>>no growth MRSA PCR>> negative Strep pneumo urinary antigen>>negative Legionella urinary antigen>> Mycoplasma pneumonia>>   Antimicrobials:  Cefepime 9/16>> Vancomycin 9/16>>09/18 Azithromycin 09/17>>  Interim History / Subjective:  Pt pleasantly confused attempting to wean off levophed gtt.  Remains off insulin gtt   OBJECTIVE   Blood pressure 95/79, pulse 97, temperature 97.9 F (36.6 C), temperature source Oral, resp. rate (!) 23, height 5' 9"  (1.753 m), weight 78 kg, SpO2 96 %.        Intake/Output Summary (Last 24 hours) at 03/02/2021 0957 Last data filed at 03/02/2021 2202 Gross per 24 hour  Intake 5460.58 ml  Output 3675 ml  Net 1785.58 ml   Filed Weights   02/28/21 0444  Weight: 78 kg    Examination: GENERAL: 80 year-old elderly male lying in the bed with no acute distress.  HEENT: Head atraumatic, normocephalic. Oropharynx and nasopharynx clear. PERRLA NECK:  Supple, no jugular venous distention.  LUNGS: Clear throughout, even, non labored  CARDIOVASCULAR: S1, S2 normal. No murmurs, rubs, or gallops; 2+ radial/1+ distal pulses  ABDOMEN: Soft, nontender, nondistended. Bowel sounds present.  EXTREMITIES: No pedal edema, cyanosis, or clubbing.  NEUROLOGIC: Disoriented to situation, pleasant, following commands, PERRLA SKIN: Left forehead abrasion   Labs/imaging that I havepersonally reviewed  (right click and "Reselect all SmartList Selections" daily)   Labs   CBC: Recent  Labs  Lab 02/28/21 0452 03/01/21 0415 03/02/21 0326  WBC 10.5 10.4 9.7  NEUTROABS 5.4 7.0 7.1  HGB 15.2 11.0* 12.8*  HCT 44.3 32.4* 37.7*  MCV 89.7 92.8 91.3  PLT 259 176 409    Basic Metabolic Panel: Recent Labs  Lab 02/28/21 1643 02/28/21 2124 03/01/21 0415 03/01/21 0542 03/02/21 0326  NA 132* 133* 132* 137 140  K 3.1* 3.7 3.6 3.7 4.2  CL 96* 100 102 105 105  CO2 26 24 23 27 28   GLUCOSE 188* 149* 295* 173* 144*  BUN 24* 21 15 18 20   CREATININE 1.60* 1.36* 1.01 1.28* 1.14  CALCIUM 9.0 8.8* 7.9* 8.8* 8.9  MG  --   --  1.3* 1.7 1.8  PHOS  --   --  1.4* 1.9* 2.5   GFR: Estimated Creatinine Clearance: 52.5 mL/min (by C-G formula based on SCr of 1.14 mg/dL). Recent Labs  Lab 02/28/21 0452 02/28/21 0645 02/28/21 1244 02/28/21 1636 02/28/21 2345 03/01/21 0415 03/01/21 0542 03/02/21 0326  PROCALCITON  --   --  <0.10  --   --  <0.10  --  0.11  WBC 10.5  --   --   --   --  10.4  --  9.7  LATICACIDVEN  --    < > 4.4* 3.3* 3.0* 5.3* 1.5  --    < > = values in this interval not displayed.    Liver Function Tests: Recent Labs  Lab 02/28/21 0452  AST 15  ALT 11  ALKPHOS 98  BILITOT 1.5*  PROT 6.9  ALBUMIN 3.8   No results for input(s): LIPASE, AMYLASE in the last 168 hours. No results for input(s): AMMONIA in the last 168 hours.  ABG    Component Value Date/Time   PHART 7.46 (H) 06/23/2020 0906   PCO2ART 39 06/23/2020 0906   PO2ART 59 (L) 06/23/2020 0906   HCO3 25.8 02/04/2021 1701   ACIDBASEDEF 1.0 02/04/2021 1701   O2SAT 57.5 02/04/2021 1701     Coagulation Profile: No results for input(s): INR, PROTIME in the last 168 hours.  Cardiac Enzymes: No results for input(s): CKTOTAL, CKMB, CKMBINDEX, TROPONINI in the last 168 hours.  HbA1C: Hemoglobin A1C  Date/Time Value Ref Range Status  03/03/2016 12:00 AM 7.1%  Final  07/16/2014 04:40 AM 9.7 (H) 4.2 - 6.3 % Final    Comment:    The American Diabetes Association recommends that a primary goal  of therapy should be <7% and that physicians should reevaluate the treatment regimen in patients with HbA1c values consistently >8%.    HB A1C (BAYER DCA - WAIVED)  Date/Time Value Ref Range Status  02/04/2021 03:11 PM >14.0 (H) <7.0 % Final    Comment:                                          Diabetic Adult            <7.0                                       Healthy Adult        4.3 - 5.7                                                           (  DCCT/NGSP) American Diabetes Association's Summary of Glycemic Recommendations for Adults with Diabetes: Hemoglobin A1c <7.0%. More stringent glycemic goals (A1c <6.0%) may further reduce complications at the cost of increased risk of hypoglycemia.   11/26/2020 11:49 AM 13.9 (H) <7.0 % Final    Comment:                                          Diabetic Adult            <7.0                                       Healthy Adult        4.3 - 5.7                                                           (DCCT/NGSP) American Diabetes Association's Summary of Glycemic Recommendations for Adults with Diabetes: Hemoglobin A1c <7.0%. More stringent glycemic goals (A1c <6.0%) may further reduce complications at the cost of increased risk of hypoglycemia.     CBG: Recent Labs  Lab 03/01/21 1944 03/01/21 2213 03/01/21 2349 03/02/21 0353 03/02/21 0730  GLUCAP 371* 296* 264* 158* 91    Allergies Allergies  Allergen Reactions   Gabapentin Other (See Comments)    "made me fall out"     Home Medications  Prior to Admission medications   Medication Sig Start Date End Date Taking? Authorizing Provider  albuterol (VENTOLIN HFA) 108 (90 Base) MCG/ACT inhaler Inhale 2 puffs into the lungs every 6 (six) hours as needed for wheezing or shortness of breath. 02/04/21  Yes Cannady, Jolene T, NP  aspirin EC 81 MG tablet Take 81 mg by mouth daily. Swallow whole.   Yes [provider]  atorvastatin (LIPITOR) 80 MG tablet TAKE 1 TABLET EVERY  DAY 12/26/20  Yes Cannady, Jolene T, NP  fluticasone (FLONASE) 50 MCG/ACT nasal spray Place 2 sprays into both nostrils daily as needed.    Yes [provider]  insulin glargine (LANTUS) 100 UNIT/ML injection Inject 0.35 mLs (35 Units total) into the skin every evening. 1800 02/04/21  Yes Cannady, Jolene T, NP  insulin lispro (HUMALOG KWIKPEN) 100 UNIT/ML KwikPen Inject 15 Units into the skin 3 (three) times daily before meals. Continue to check blood sugar after meals. Patient taking differently: Inject 15 Units into the skin in the morning and at bedtime. Continue to check blood sugar after meals. 02/05/21  Yes Sharen Hones, MD  lisinopril (ZESTRIL) 5 MG tablet Take 5 mg by mouth daily.   Yes [provider]  metFORMIN (GLUCOPHAGE) 500 MG tablet Take 2 tablets (1,000 mg total) by mouth 2 (two) times daily with a meal. 1088m in the morning 10094mat night 05/17/20  Yes Cannady, Jolene T, NP  Omega-3 Fatty Acids (OMEGA 3 500 PO) Take 500 mg by mouth daily.   Yes [provider]  omeprazole (PRILOSEC) 20 MG capsule Take 1 capsule (20 mg total) by mouth daily. 01/14/21  Yes Cannady, JoHenrine Screws, NP  tamsulosin (FLOMAX) 0.4 MG CAPS capsule TAKE 1 CAPSULE EVERY DAY 07/31/20  Yes Cannady,  Barbaraann Faster, NP  TRUE METRIX BLOOD GLUCOSE TEST test strip CHECK BLOOD SUGAR TWICE DAILY 11/01/20   Cannady, Henrine Screws T, NP  TRUEplus Lancets 33G MISC CHECK BLOOD SUGAR TWICE DAILY 10/18/20   Marnee Guarneri T, NP  Scheduled Meds:  aspirin EC  81 mg Oral Daily   atorvastatin  80 mg Oral Daily   azithromycin  500 mg Oral Daily   Chlorhexidine Gluconate Cloth  6 each Topical Daily   feeding supplement (NEPRO CARB STEADY)  237 mL Oral TID BM   heparin  5,000 Units Subcutaneous Q8H   hydrocortisone sod succinate (SOLU-CORTEF) inj  100 mg Intravenous Q8H   insulin aspart  0-20 Units Subcutaneous Q4H   [START ON 03/03/2021] insulin glargine-yfgn  15 Units Subcutaneous Daily   midodrine  10 mg Oral TID WC    multivitamin with minerals  1 tablet Oral Daily   omega-3 acid ethyl esters  1,000 mg Oral Daily   pantoprazole  40 mg Oral Daily   sodium chloride flush  10-40 mL Intracatheter Q12H   tamsulosin  0.4 mg Oral Daily   Continuous Infusions:  ceFEPime (MAXIPIME) IV 2 g (03/02/21 0840)   lactated ringers 75 mL/hr at 03/02/21 0830   norepinephrine (LEVOPHED) Adult infusion 3 mcg/min (03/02/21 0717)   PRN Meds:.albuterol, dextrose, fluticasone, sodium chloride flush  Resolved Hospital Problem list   Lactic acidosis Hyponatremia HHS  ASSESSMENT & PLAN  Shock, suspect Hypovolemic due to HHS +/-  Septic due to questionable Pneumonia Mildly elevated Troponin, suspect Demand Ischemia PMHx of Hypertension, Hyperlipidemia -Continuous cardiac monitoring -Maintain MAP >65 -Aggressive IV fluids  -Monitor fever curve -Trend WBC's & Procalcitonin -Follow cultures as above -Continue cefepime and azithromycin  -Vasopressors as needed to maintain MAP goal -Continue Midodrine -Hold home antihypertensives -Continue outpatient aspirin    Acute Kidney Injury-resolved  Lactic Acidosis now resolved Hypertonic Hyponatremia due to severely elevated glucose now resolved -Monitor I&O's / urinary output -Follow BMP -Ensure adequate renal perfusion -Avoid nephrotoxic agents as able -Replace electrolytes as indicated -IV fluids  PMHx of Diabetes Mellitus Type II Presenting with Hyperosmolar Hyperglycemic State-resolved -CBGs ac/hs -Sliding scale insulin and scheduled semglee  -Follow ICU hyper/hypoglycemia protocol -Hold home Meds for now -Diabetes Coordinator consulted, appreciate input   Best practice (right click and "Reselect all SmartList Selections" daily)  Diet:  Oral Pain/Anxiety/Delirium protocol (if indicated): No VAP protocol (if indicated): Not indicated DVT prophylaxis: Subcutaneous Heparin GI prophylaxis: PPI Glucose control:  SSI Yes Central venous access:  Yes, Still  indicated Arterial line:  N/A Foley:  N/A Mobility:  bed rest  PT consulted: N/A Last date of multidisciplinary goals of care discussion [9/17] Code Status:  full code Disposition: ICU  Critical care time: [place], [place] Pager [phone] (please enter 7 digits) PCCM Consult Pager [phone] (please enter 7 digits)

## 2021-03-03 DIAGNOSIS — E1165 Type 2 diabetes mellitus with hyperglycemia: Secondary | ICD-10-CM | POA: Diagnosis not present

## 2021-03-03 DIAGNOSIS — E1122 Type 2 diabetes mellitus with diabetic chronic kidney disease: Secondary | ICD-10-CM

## 2021-03-03 DIAGNOSIS — J189 Pneumonia, unspecified organism: Secondary | ICD-10-CM | POA: Diagnosis not present

## 2021-03-03 DIAGNOSIS — E1100 Type 2 diabetes mellitus with hyperosmolarity without nonketotic hyperglycemic-hyperosmolar coma (NKHHC): Secondary | ICD-10-CM | POA: Diagnosis not present

## 2021-03-03 LAB — BASIC METABOLIC PANEL
Anion gap: 3 — ABNORMAL LOW (ref 5–15)
BUN: 23 mg/dL (ref 8–23)
CO2: 27 mmol/L (ref 22–32)
Calcium: 8.6 mg/dL — ABNORMAL LOW (ref 8.9–10.3)
Chloride: 106 mmol/L (ref 98–111)
Creatinine, Ser: 1.17 mg/dL (ref 0.61–1.24)
GFR, Estimated: >60 mL/min (ref >60)
Glucose, Bld: 175 mg/dL — ABNORMAL HIGH (ref 70–99)
Potassium: 3.8 mmol/L (ref 3.5–5.1)
Sodium: 136 mmol/L (ref 135–145)

## 2021-03-03 LAB — CBC WITH DIFFERENTIAL/PLATELET
Abs Immature Granulocytes: 0.06 10*3/uL (ref 0.00–0.07)
Basophils Absolute: 0 10*3/uL (ref 0.0–0.1)
Basophils Relative: 0 %
Eosinophils Absolute: 0.1 10*3/uL (ref 0.0–0.5)
Eosinophils Relative: 1 %
HCT: 33.8 % — ABNORMAL LOW (ref 39.0–52.0)
Hemoglobin: 11.2 g/dL — ABNORMAL LOW (ref 13.0–17.0)
Immature Granulocytes: 1 %
Lymphocytes Relative: 24 %
Lymphs Abs: 2.2 10*3/uL (ref 0.7–4.0)
MCH: 30.8 pg (ref 26.0–34.0)
MCHC: 33.1 g/dL (ref 30.0–36.0)
MCV: 92.9 fL (ref 80.0–100.0)
Monocytes Absolute: 0.9 10*3/uL (ref 0.1–1.0)
Monocytes Relative: 9 %
Neutro Abs: 5.8 10*3/uL (ref 1.7–7.7)
Neutrophils Relative %: 65 %
Platelets: 178 10*3/uL (ref 150–400)
RBC: 3.64 MIL/uL — ABNORMAL LOW (ref 4.22–5.81)
RDW: 12.8 % (ref 11.5–15.5)
WBC: 9 10*3/uL (ref 4.0–10.5)
nRBC: 0 % (ref 0.0–0.2)

## 2021-03-03 LAB — HEMOGLOBIN A1C
Hgb A1c MFr Bld: >15.5 % — ABNORMAL HIGH (ref 4.8–5.6)
Mean Plasma Glucose: >398 mg/dL

## 2021-03-03 LAB — GLUCOSE, CAPILLARY
Glucose-Capillary: 132 mg/dL — ABNORMAL HIGH (ref 70–99)
Glucose-Capillary: 201 mg/dL — ABNORMAL HIGH (ref 70–99)
Glucose-Capillary: 186 mg/dL — ABNORMAL HIGH (ref 70–99)
Glucose-Capillary: 301 mg/dL — ABNORMAL HIGH (ref 70–99)

## 2021-03-03 LAB — MAGNESIUM: Magnesium: 1.7 mg/dL (ref 1.7–2.4)

## 2021-03-03 LAB — LEGIONELLA PNEUMOPHILA SEROGP 1 UR AG: L. pneumophila Serogp 1 Ur Ag: NEGATIVE

## 2021-03-03 LAB — PHOSPHORUS: Phosphorus: 3 mg/dL (ref 2.5–4.6)

## 2021-03-03 MED ORDER — INSULIN GLARGINE-YFGN 100 UNIT/ML ~~LOC~~ SOLN
20.0000 [IU] | Freq: Every day | SUBCUTANEOUS | Status: DC
  Administered 2021-03-04: 20 [IU] via SUBCUTANEOUS
  Filled 2021-03-03 (×2): qty 0.2

## 2021-03-03 MED ORDER — MAGNESIUM SULFATE 2 GM/50ML IV SOLN
2.0000 g | Freq: Once | INTRAVENOUS | Status: AC
  Administered 2021-03-03: 2 g via INTRAVENOUS
  Filled 2021-03-03: qty 50

## 2021-03-03 MED ORDER — HYDROCORTISONE SOD SUC (PF) 100 MG IJ SOLR
50.0000 mg | Freq: Three times a day (TID) | INTRAMUSCULAR | Status: DC
  Administered 2021-03-03 – 2021-03-04 (×3): 50 mg via INTRAVENOUS
  Filled 2021-03-03 (×3): qty 2

## 2021-03-03 MED ORDER — ENSURE MAX PROTEIN PO LIQD
11.0000 [oz_av] | Freq: Two times a day (BID) | ORAL | Status: DC
  Administered 2021-03-03 – 2021-03-04 (×3): 11 [oz_av] via ORAL
  Filled 2021-03-03: qty 330

## 2021-03-03 MED ORDER — CEFDINIR 300 MG PO CAPS
300.0000 mg | ORAL_CAPSULE | Freq: Two times a day (BID) | ORAL | Status: DC
  Administered 2021-03-03 – 2021-03-04 (×2): 300 mg via ORAL
  Filled 2021-03-03 (×3): qty 1

## 2021-03-03 NOTE — Progress Notes (Signed)
NAME:  Scott Dubuc., MRN:  [id number], DOB:  [birth year], LOS: 3 ADMISSION DATE:  02/28/2021, INITIAL CONSULTATION DATE:  02/28/2021 REFERRING MD: Dr. Rowe Pavy , CHIEF COMPLAINT:  Falls   Brief Patient Description  80 y.o. Male admitted with HHS, multifactorial shock (hypovolemic vs. Septic), questionable pneumonia, and Acute Kidney Injury.  He met criteria for HHS, therefore he was given 4 L of lactated Ringer's and was placed on an insulin drip.  Despite aggressive IV fluids he remained hypotensive with systolic blood pressures in the 80s to low 90s.  Given poor fluid responsiveness to IV fluids, he was placed on low-dose Levophed.  Concern is for possible developing septic shock due to potential pneumonia.   Pertinent  Medical History  Diabetes mellitus type 2 BPH GERD Hypertension Hyperlipidemia  Significant Hospital Events: Including procedures, antibiotic start and stop dates in addition to other pertinent events   Significant labs: Sodium 125, chloride 87, glucose 755, BUN 29, creatinine 2.13, high-sensitivity troponin 39, lactic acid 2.4, WBC 10.5, serum osmolality 306 Urinalysis is negative for UTI, and negative for ketones COVID-19 PCR is negative Imaging: CT head without contrast 09/17>>Atrophy, chronic microvascular disease. No acute intracranial abnormality. CT cervical spine 09/17>>Degenerative disc and facet disease.  No acute bony abnormality. Chest x-ray 09/16>>Patient rotated to the right. The cardio pericardial silhouette is enlarged. Interstitial markings are diffusely coarsened with chronic features. Superimpose patchy airspace disease noted bilaterally, right greater than left. No substantial pleural effusion. The visualized bony structures of the thorax show no acute abnormality. Telemetry leads overlie the chest. CT chest without contrast 09/16>>Marked interval decrease in the diffuse ground-glass opacity seen previously. Areas of architectural distortion and  scarring bilaterally with subpleural reticulation in basilar predominant traction bronchiectasis. Underlying component of pulmonary fibrosis suspected. High-resolution chest CT may prove helpful to further evaluate as clinically warranted. Stable mild mediastinal lymphadenopathy, likely reactive given the 9 month interval stability. 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 Aortic Atherosclerosis  Cultures:  SARS-CoV-2 PCR>> negative Influenza PCR>> negative Blood culture x2>>No growth thus far Urine>>no growth MRSA PCR>> negative Strep pneumo urinary antigen>>negative Legionella urinary antigen>> Mycoplasma pneumonia>>   Antimicrobials:  Cefepime 9/16>> Vancomycin 9/16>>09/18 Azithromycin 09/17>>  Interim History / Subjective:  Pt pleasantly confused remains off levophed gtt.  Pt currently working with physical therapy and tolerating well  OBJECTIVE   Blood pressure 112/74, pulse (!) 58, temperature 98.4 F (36.9 C), temperature source Oral, resp. rate 18, height 5' 9"  (1.753 m), weight 78 kg, SpO2 95 %.        Intake/Output Summary (Last 24 hours) at 03/03/2021 0909 Last data filed at 03/03/2021 0700 Gross per 24 hour  Intake 2796.8 ml  Output 4000 ml  Net -1203.2 ml   Filed Weights   02/28/21 0444  Weight: 78 kg    Examination: GENERAL: 80 year-old elderly male lying in the bed with no acute distress.  HEENT: Head atraumatic, normocephalic. Oropharynx and nasopharynx clear. PERRLA NECK:  Supple, no jugular venous distention.  LUNGS: Clear throughout, even, non labored  CARDIOVASCULAR: S1, S2 normal. No murmurs, rubs, or gallops; 2+ radial/1+ distal pulses  ABDOMEN: Soft, nontender, nondistended. Bowel sounds present.  EXTREMITIES: No pedal edema, cyanosis, or clubbing.  NEUROLOGIC: Disoriented to situation, pleasant, following commands, PERRLA SKIN: Left forehead abrasion   Labs/imaging that I havepersonally reviewed  (right click and "Reselect all SmartList Selections" daily)    Labs   CBC: Recent Labs  Lab 02/28/21 0452 03/01/21 0415 03/02/21 0326 03/03/21 0514  WBC  10.5 10.4 9.7 9.0  NEUTROABS 5.4 7.0 7.1 5.8  HGB 15.2 11.0* 12.8* 11.2*  HCT 44.3 32.4* 37.7* 33.8*  MCV 89.7 92.8 91.3 92.9  PLT 259 176 195 962    Basic Metabolic Panel: Recent Labs  Lab 02/28/21 2124 03/01/21 0415 03/01/21 0542 03/02/21 0326 03/03/21 0514  NA 133* 132* 137 140 136  K 3.7 3.6 3.7 4.2 3.8  CL 100 102 105 105 106  CO2 24 23 27 28 27   GLUCOSE 149* 295* 173* 144* 175*  BUN 21 15 18 20 23   CREATININE 1.36* 1.01 1.28* 1.14 1.17  CALCIUM 8.8* 7.9* 8.8* 8.9 8.6*  MG  --  1.3* 1.7 1.8 1.7  PHOS  --  1.4* 1.9* 2.5 3.0   GFR: Estimated Creatinine Clearance: 51.2 mL/min (by C-G formula based on SCr of 1.17 mg/dL). Recent Labs  Lab 02/28/21 0452 02/28/21 0645 02/28/21 1244 02/28/21 1636 02/28/21 2345 03/01/21 0415 03/01/21 0542 03/02/21 0326 03/03/21 0514  PROCALCITON  --   --  <0.10  --   --  <0.10  --  0.11  --   WBC 10.5  --   --   --   --  10.4  --  9.7 9.0  LATICACIDVEN  --    < > 4.4* 3.3* 3.0* 5.3* 1.5  --   --    < > = values in this interval not displayed.    Liver Function Tests: Recent Labs  Lab 02/28/21 0452  AST 15  ALT 11  ALKPHOS 98  BILITOT 1.5*  PROT 6.9  ALBUMIN 3.8   No results for input(s): LIPASE, AMYLASE in the last 168 hours. No results for input(s): AMMONIA in the last 168 hours.  ABG    Component Value Date/Time   PHART 7.46 (H) 06/23/2020 0906   PCO2ART 39 06/23/2020 0906   PO2ART 59 (L) 06/23/2020 0906   HCO3 25.8 02/04/2021 1701   ACIDBASEDEF 1.0 02/04/2021 1701   O2SAT 57.5 02/04/2021 1701     Coagulation Profile: No results for input(s): INR, PROTIME in the last 168 hours.  Cardiac Enzymes: No results for input(s): CKTOTAL, CKMB, CKMBINDEX, TROPONINI in the last 168 hours.  HbA1C: Hemoglobin A1C  Date/Time Value Ref Range Status  03/03/2016 12:00 AM 7.1%  Final  07/16/2014 04:40 AM 9.7 (H) 4.2 - 6.3  % Final    Comment:    The American Diabetes Association recommends that a primary goal of therapy should be <7% and that physicians should reevaluate the treatment regimen in patients with HbA1c values consistently >8%.    HB A1C (BAYER DCA - WAIVED)  Date/Time Value Ref Range Status  02/04/2021 03:11 PM >14.0 (H) <7.0 % Final    Comment:                                          Diabetic Adult            <7.0                                       Healthy Adult        4.3 - 5.7                                                           (  DCCT/NGSP) American Diabetes Association's Summary of Glycemic Recommendations for Adults with Diabetes: Hemoglobin A1c <7.0%. More stringent glycemic goals (A1c <6.0%) may further reduce complications at the cost of increased risk of hypoglycemia.   11/26/2020 11:49 AM 13.9 (H) <7.0 % Final    Comment:                                          Diabetic Adult            <7.0                                       Healthy Adult        4.3 - 5.7                                                           (DCCT/NGSP) American Diabetes Association's Summary of Glycemic Recommendations for Adults with Diabetes: Hemoglobin A1c <7.0%. More stringent glycemic goals (A1c <6.0%) may further reduce complications at the cost of increased risk of hypoglycemia.     CBG: Recent Labs  Lab 03/02/21 0730 03/02/21 1130 03/02/21 1558 03/02/21 2131 03/03/21 0748  GLUCAP 91 238* 255* 279* 132*    Allergies Allergies  Allergen Reactions   Gabapentin Other (See Comments)    "made me fall out"     Home Medications  Prior to Admission medications   Medication Sig Start Date End Date Taking? Authorizing Provider  albuterol (VENTOLIN HFA) 108 (90 Base) MCG/ACT inhaler Inhale 2 puffs into the lungs every 6 (six) hours as needed for wheezing or shortness of breath. 02/04/21  Yes Cannady, Jolene T, NP  aspirin EC 81 MG tablet Take 81 mg by mouth daily. Swallow whole.    Yes [provider]  atorvastatin (LIPITOR) 80 MG tablet TAKE 1 TABLET EVERY DAY 12/26/20  Yes Cannady, Jolene T, NP  fluticasone (FLONASE) 50 MCG/ACT nasal spray Place 2 sprays into both nostrils daily as needed.    Yes [provider]  insulin glargine (LANTUS) 100 UNIT/ML injection Inject 0.35 mLs (35 Units total) into the skin every evening. 1800 02/04/21  Yes Cannady, Jolene T, NP  insulin lispro (HUMALOG KWIKPEN) 100 UNIT/ML KwikPen Inject 15 Units into the skin 3 (three) times daily before meals. Continue to check blood sugar after meals. Patient taking differently: Inject 15 Units into the skin in the morning and at bedtime. Continue to check blood sugar after meals. 02/05/21  Yes Sharen Hones, MD  lisinopril (ZESTRIL) 5 MG tablet Take 5 mg by mouth daily.   Yes [provider]  metFORMIN (GLUCOPHAGE) 500 MG tablet Take 2 tablets (1,000 mg total) by mouth 2 (two) times daily with a meal. 1059m in the morning 10080mat night 05/17/20  Yes Cannady, Jolene T, NP  Omega-3 Fatty Acids (OMEGA 3 500 PO) Take 500 mg by mouth daily.   Yes [provider]  omeprazole (PRILOSEC) 20 MG capsule Take 1 capsule (20 mg total) by mouth daily. 01/14/21  Yes Cannady, JoHenrine Screws, NP  tamsulosin (FLOMAX) 0.4 MG CAPS capsule TAKE 1 CAPSULE EVERY DAY 07/31/20  Yes Cannady,  Barbaraann Faster, NP  TRUE METRIX BLOOD GLUCOSE TEST test strip CHECK BLOOD SUGAR TWICE DAILY 11/01/20   Cannady, Henrine Screws T, NP  TRUEplus Lancets 33G MISC CHECK BLOOD SUGAR TWICE DAILY 10/18/20   Marnee Guarneri T, NP  Scheduled Meds:  aspirin EC  81 mg Oral Daily   atorvastatin  80 mg Oral Daily   azithromycin  500 mg Oral Daily   Chlorhexidine Gluconate Cloth  6 each Topical Daily   feeding supplement (NEPRO CARB STEADY)  237 mL Oral TID BM   heparin  5,000 Units Subcutaneous Q8H   hydrocortisone sod succinate (SOLU-CORTEF) inj  100 mg Intravenous Q8H   insulin aspart  0-20 Units Subcutaneous TID AC & HS   insulin  glargine-yfgn  25 Units Subcutaneous Daily   midodrine  10 mg Oral TID WC   multivitamin with minerals  1 tablet Oral Daily   omega-3 acid ethyl esters  1,000 mg Oral Daily   pantoprazole  40 mg Oral Daily   sodium chloride flush  10-40 mL Intracatheter Q12H   tamsulosin  0.4 mg Oral Daily   Continuous Infusions:  ceFEPime (MAXIPIME) IV Stopped (03/02/21 2206)   lactated ringers 75 mL/hr at 03/03/21 0405   magnesium sulfate bolus IVPB 2 g (03/03/21 0902)   PRN Meds:.albuterol, dextrose, fluticasone, sodium chloride flush  Resolved Hospital Problem list   Lactic acidosis Hyponatremia HHS Acute kidney injury   ASSESSMENT & PLAN  Shock, suspect Hypovolemic due to HHS +/-  Septic due to questionable Pneumonia Mildly elevated Troponin, suspect Demand Ischemia PMHx of Hypertension, Hyperlipidemia -Continuous cardiac monitoring -Maintain MAP >65 -Aggressive IV fluids  -Monitor fever curve -Trend WBC's & Procalcitonin -Follow cultures as above -Continue cefepime and azithromycin  -Continue Midodrine  -Stress dose steroids wean as tolerated  -Hold home antihypertensives -Continue outpatient aspirin    Acute Kidney Injury-resolved  Lactic Acidosis now resolved Hypertonic Hyponatremia due to severely elevated glucose now resolved -Monitor I&O's / urinary output -Follow BMP -Ensure adequate renal perfusion -Avoid nephrotoxic agents as able -Replace electrolytes as indicated  PMHx of Diabetes Mellitus Type II Presenting with Hyperosmolar Hyperglycemic State-resolved -CBGs ac/hs -Sliding scale insulin and scheduled semglee  -Follow ICU hyper/hypoglycemia protocol -Hold home Meds for now -Diabetes Coordinator consulted, appreciate input  GERD Continue po protonix    Best practice (right click and "Reselect all SmartList Selections" daily)  Diet:  Oral Pain/Anxiety/Delirium protocol (if indicated): No VAP protocol (if indicated): Not indicated DVT prophylaxis:  Subcutaneous Heparin GI prophylaxis: PPI Glucose control:  SSI Yes Central venous access:  Yes, Still indicated Arterial line:  N/A Foley:  N/A Mobility:  bed rest  PT consulted: N/A Last date of multidisciplinary goals of care discussion [9/17] Code Status:  full code Disposition: ICU  Pt remains off of levophed gtt with maps >65.  PCCM team will sign off if you need additional assistance please call the ICU or on call pager# listed in epic.   Critical care time: [place], [place] Pager [phone] (please enter 7 digits) PCCM Consult Pager [phone] (please enter 7 digits)

## 2021-03-03 NOTE — Progress Notes (Signed)
Patient unhooked himself from chair alarm and got up to the toilet on his own.  Explained to patient need to call for nurse but no evidence of learning.

## 2021-03-03 NOTE — Plan of Care (Addendum)
Nutrition Education Note   RD consulted for nutrition education regarding diabetes.   Lab Results  Component Value Date   HGBA1C >15.5 (H) 03/02/2021   Met with pt in room today. Pt reports good appetite and oral intake in hospital; pt eating 100% of meals. Pt reports that he does not really like the Nepro supplements; pt would like to try chocolate or vanilla Ensure Max.    RD provided "Nutrition and Type II Diabetes" handout from the Academy of Nutrition and Dietetics. Discussed different food groups and their effects on blood sugar, emphasizing carbohydrate-containing foods. Provided list of carbohydrates and recommended serving sizes of common foods.  Discussed importance of controlled and consistent carbohydrate intake throughout the day. Provided examples of ways to balance meals/snacks and encouraged intake of high-fiber, whole grain complex carbohydrates. Teach back method used.  Expect fair compliance.  Body mass index is 25.39 kg/m. Pt meets criteria for overweight based on current BMI.  Nutrition Focused Physical Exam:  Flowsheet Row Most Recent Value  Orbital Region No depletion  Upper Arm Region No depletion  Thoracic and Lumbar Region No depletion  Buccal Region No depletion  Temple Region No depletion  Clavicle Bone Region Mild depletion  Clavicle and Acromion Bone Region Mild depletion  Scapular Bone Region No depletion  Dorsal Hand No depletion  Patellar Region No depletion  Anterior Thigh Region No depletion  Posterior Calf Region No depletion  Edema (RD Assessment) None  Hair Reviewed  Eyes Reviewed  Mouth Reviewed  Skin Reviewed  Nails Reviewed        RD following this pt   Koleen Distance MS, RD, LDN Please refer to [facility] for RD and/or RD on-call/weekend/after hours pager

## 2021-03-03 NOTE — Progress Notes (Signed)
Inpatient Diabetes Program Recommendations  AACE/ADA: New Consensus Statement on Inpatient Glycemic Control   Target Ranges:  Prepandial:   less than 140 mg/dL      Peak postprandial:   less than 180 mg/dL (1-2 hours)      Critically ill patients:  140 - 180 mg/dL   Results for WESS, BANEY (MRN [id number]) as of 03/03/2021 08:27  Ref. Range 03/02/2021 07:30 03/02/2021 11:30 03/02/2021 15:58 03/02/2021 21:31 03/03/2021 07:48  Glucose-Capillary Latest Ref Range: 70 - 99 mg/dL 91 238 (H) 255 (H) 279 (H) 132 (H)   Review of Glycemic Control  Current orders for Inpatient glycemic control: Semglee 25 units daily, Novolog 0-20 units TID with meals and bedtime; Solucortef 100 mg Q8H  Inpatient Diabetes Program Recommendations:    Insulin: Noted patient received Semglee 15 units on 03/02/21 and fasting glucose 132 mg/dl today. Per chart, Semglee was increased to 25 units to be started today. Would recommend decreasing Semglee to 15 units daily and if steroids are continued, please consider ordering Novolog 6 units TID with meals for meal coverage if patient eats at least 50% of meals.  Thanks, Barnie Alderman, RN, MSN, CDE Diabetes Coordinator Inpatient Diabetes Program [phone] (Team Pager from 8am to 5pm)

## 2021-03-03 NOTE — Evaluation (Addendum)
Occupational Therapy Evaluation Patient Details Name: Scott Lawson. MRN: [id number] DOB: 04-Mar-1941 Today's Date: 03/03/2021   History of Present Illness 80 y.o. Male admitted with HHS, multifactorial shock (hypovolemic vs. Septic), questionable pneumonia, and Acute Kidney Injury. He has a PMHx significant for T2DM, BPH, GERD, HTN, HLD.   Clinical Impression   Pt seen for OT evaluation this date. Prior to admission, pt was independent in all ADLs and functional mobility, living in a 2-story home with granddaughter. Pt does not use supplemental O2 at home. Pt currently requires SUPERVISION for bed mobility, MIN GUARD for seated LB dressing, MIN GUARD for sit<>stand transfers with RW, MIN GUARD for standing grooming tasks, and MIN GUARD for functional mobility of short household distances (~45f) with RW due to current functional impairments (See OT Problem List below). Of note, pt (+) for orthostatic hypotension, however asymptomatic. Additionally, SpO2 desat 77% following functional mobility of short household distances; RN informed and SpO2 able to increase to 94% within 2 mins of seated rest break and PLB. Pt educated on importance of implementing energy conservation strategies (i.e., PLB and seated rest breaks) during ADLs/functional mobility; pt verbalized understanding. Pt would benefit from additional skilled OT services to maximize return to PLOF and minimize risk of future falls, injury, caregiver burden, and readmission. Upon discharge, recommend [facility].       Recommendations for follow up therapy are one component of a multi-disciplinary discharge planning process, led by the attending physician.  Recommendations may be updated based on patient status, additional functional criteria and insurance authorization.   Follow Up Recommendations  Home health OT;Supervision/Assistance - 24 hour    Equipment Recommendations  Other (comment) (TBD)       Precautions / Restrictions  Precautions Precautions: Fall Restrictions Weight Bearing Restrictions: No      Mobility Bed Mobility Overal bed mobility: Needs Assistance Bed Mobility: Supine to Sit     Supine to sit: Supervision;HOB elevated     General bed mobility comments: With [facility] and use of bed rails, no physical assist provided    Transfers Overall transfer level: Needs assistance Equipment used: Rolling walker (2 wheeled) Transfers: Sit to/from Stand Sit to Stand: Min guard         General transfer comment: pt demonstrated safe hand placement    Balance Overall balance assessment: Needs assistance Sitting-balance support: No upper extremity supported;Feet supported Sitting balance-Leahy Scale: Good Sitting balance - Comments: Good sitting balance at EOB during seated LB dressing   Standing balance support: Bilateral upper extremity supported;During functional activity Standing balance-Leahy Scale: Fair Standing balance comment: MIN GUARD with BUE support from RW                           ADL either performed or assessed with clinical judgement   ADL Overall ADL's : Needs assistance/impaired     Grooming: Oral care;Supervision/safety;Set up;Sitting;Wash/dry face;Min guard;Standing               Lower Body Dressing: Min guard;Sitting/lateral leans Lower Body Dressing Details (indicate cue type and reason): to don socks sitting EOB             Functional mobility during ADLs: Min guard;Rolling walker       Vision Baseline Vision/History: 0 No visual deficits Patient Visual Report: No change from baseline              Pertinent Vitals/Pain Pain Assessment: No/denies pain  Hand Dominance Right   Extremity/Trunk Assessment Upper Extremity Assessment Upper Extremity Assessment: RUE deficits/detail;LUE deficits/detail RUE Deficits / Details: Unable to flex shoulder >80 degrees d/t hx of shoulder injury. Elbow flex/ext 4-/5. Grip strength  5/5 LUE Deficits / Details: Shoulder flex 3+/5. Elbow flex/ext 4-/5. Grip strength 5/5   Lower Extremity Assessment Lower Extremity Assessment: Overall WFL for tasks assessed       Communication Communication Communication: HOH   Cognition Arousal/Alertness: Awake/alert Behavior During Therapy: WFL for tasks assessed/performed Overall Cognitive Status: No family/caregiver present to determine baseline cognitive functioning                                 General Comments: Alert and oriented to self, place, and some aspects of situation. Disoriented to date. Tangential at times, however agreeable and motivated to participate in therapy   General Comments  Pt + for orthostatic hypotension, however asymptomatic. On RA, SpO2 desat 77% following functional mobility of short household distances (~17f) with RW. SpO2 able to increase to 94% within 2 mins of seated rest break and PLB    Exercises Other Exercises Other Exercises: Education on energy conservation strategies (i.e., PLB and seated rest breaks), pt verbalized understanding        Home Living Family/patient expects to be discharged to:: Private residence Living Arrangements: Other relatives (grand daughter) Available Help at Discharge: Family;Available PRN/intermittently;Available 24 hours/day Type of Home: House       Home Layout: Two level;Able to live on main level with bedroom/bathroom               Home Equipment: CKasandra Knudsen- single point          Prior Functioning/Environment Level of Independence: Independent with assistive device(s)        Comments: Mod indep with SPC for ADLs, household mobilization; 2 falls at home. pt reports falling when he moves too fast. Manages a farm        OT Problem List: Decreased strength;Decreased activity tolerance;Impaired balance (sitting and/or standing);Decreased safety awareness;Cardiopulmonary status limiting activity      OT Treatment/Interventions:  Self-care/ADL training;Therapeutic exercise;Energy conservation;DME and/or AE instruction;Therapeutic activities;Patient/family education;Balance training    OT Goals(Current goals can be found in the care plan section) Acute Rehab OT Goals Patient Stated Goal: to return home with granddaughter OT Goal Formulation: With patient Time For Goal Achievement: 03/17/21 Potential to Achieve Goals: Good ADL Goals Pt Will Perform Grooming: with modified independence;standing Pt Will Transfer to Toilet: with modified independence;ambulating;regular height toilet Pt Will Perform Toileting - Clothing Manipulation and hygiene: with modified independence;sitting/lateral leans  OT Frequency: Min 1X/week    AM-PAC OT "6 Clicks" Daily Activity     Outcome Measure Help from another person eating meals?: None Help from another person taking care of personal grooming?: A Little Help from another person toileting, which includes using toliet, bedpan, or urinal?: A Little Help from another person bathing (including washing, rinsing, drying)?: A Lot Help from another person to put on and taking off regular upper body clothing?: None Help from another person to put on and taking off regular lower body clothing?: A Little 6 Click Score: 19   End of Session Equipment Utilized During Treatment: Gait belt;Rolling walker Nurse Communication: Mobility status;Other (comment) (vitals during mobility)  Activity Tolerance: Treatment limited secondary to medical complications (Comment) (orthostatic and SpO2 desat) Patient left: in chair;with call bell/phone within reach;with chair alarm set;with nursing/sitter in  room  OT Visit Diagnosis: Unsteadiness on feet (R26.81);History of falling (Z91.81)                Time: [id number] OT Time Calculation (min): 32 min Charges:  OT General Charges $OT Visit: 1 Visit OT Evaluation $OT Eval Moderate Complexity: 1 Mod OT Treatments $Self Care/Home Management : 8-22  mins $Therapeutic Activity: 8-22 mins  Fredirick Maudlin, OTR/L [facility]

## 2021-03-03 NOTE — Progress Notes (Signed)
Chaplain Maggie made initial visitation with pt at bedside. Pt was watching TV. Space was made for storytelling and listening. Pt was conversational and appreciative of visit. Chaplain expects to follow up.

## 2021-03-03 NOTE — Progress Notes (Signed)
[facility] PROGRESS NOTE   Scott Lawson Meek [name] TO:[id number] DOB: 05-18-1941 DOA: 02/28/2021  PCP: Venita Lick, NP  Brief History/Interval Summary: 80 y.o. male with a past medical history of insulin-dependent diabetes mellitus type 2, hypertension, dyslipidemia, BPH, GERD.  Presented to the emergency department after sustaining 2 falls at home.  Did not want to come to the hospital after his initial fall.  Patient noted to be a poor historian.  He was found to have severe hyperglycemia requiring IV insulin infusion.  Subsequently became hypotensive with concern for sepsis from pneumonia.  He was admitted to the ICU.  Seen by critical care medicine.  Started on broad-spectrum antibiotics.  Required initiation of vasopressors as well.  Consultants: Critical care medicine  Procedures: PICC line placement.   Central line placement (patient pulled out central line)  Antibiotics: Anti-infectives (From admission, onward)    Start     Dose/Rate Route Frequency Ordered Stop   03/01/21 1430  vancomycin (VANCOREADY) IVPB 1250 mg/250 mL  Status:  Discontinued        1,250 mg 166.7 mL/hr over 90 Minutes Intravenous Every 24 hours 03/01/21 1341 03/02/21 0943   03/01/21 1145  azithromycin (ZITHROMAX) tablet 500 mg        500 mg Oral Daily 03/01/21 1057 03/06/21 0959   02/28/21 2200  ceFEPIme (MAXIPIME) 2 g in sodium chloride 0.9 % 100 mL IVPB        2 g 200 mL/hr over 30 Minutes Intravenous Every 12 hours 02/28/21 1841     02/28/21 1300  vancomycin (VANCOREADY) IVPB 750 mg/150 mL       See Hyperspace for full Linked Orders Report.   750 mg 150 mL/hr over 60 Minutes Intravenous  Once 02/28/21 1159 02/28/21 1414   02/28/21 1200  ceFEPIme (MAXIPIME) 2 g in sodium chloride 0.9 % 100 mL IVPB        2 g 200 mL/hr over 30 Minutes Intravenous  Once 02/28/21 1159 02/28/21 1316   02/28/21 1200  vancomycin (VANCOCIN) IVPB 1000 mg/200 mL premix       See Hyperspace for full Linked Orders  Report.   1,000 mg 200 mL/hr over 60 Minutes Intravenous  Once 02/28/21 1159 02/28/21 1636       Subjective/Interval History: Patient seems to be much more awake and alert this morning.  He is oriented.  Looking forward to going home.  Denies any shortness of breath.  No nausea or vomiting.      Assessment/Plan:  Shock, septic versus hypovolemic versus combination/questionable pneumonia/acute respiratory failure with hypoxia Hypotension was refractory to aggressive IV fluid hydration.  Subsequently started on vasopressors.  Critical care medicine was consulted.  Levophed was titrated off yesterday.  Blood pressure has been stable.  Lactic acid level peaked at 5.3 and improved to 1.5.  Concern was raised for pneumonia on imaging studies.  Procalcitonin however was only 0.11.  WBC was normal.  MRSA PCR was nondetected.  Initially placed on vancomycin and cefepime.  Azithromycin was added.  Due to negative blood cultures vancomycin was discontinued yesterday.  He remains on oxygen at 2 L.  Continue to wean down as tolerated. Remains on midodrine as well as stress dose steroids which will be tapered down today.   Change cefepime to Omnicef.  Acute kidney injury Presented with a creatinine of 2.13.  Baseline is around 1.1.  Likely due to combination of sepsis and hypovolemia.  Was aggressively hydrated.  Renal function is back to baseline.  Continue to monitor urine output.  Hyperosmolar hyperglycemic state/history of insulin-dependent diabetes mellitus, uncontrolled Presented with a significantly elevated glucose levels of greater than 700.  Unclear if this is due to noncompliance or not.  Placed on insulin infusion. HbA1c greater than 14 implying very poor control and likely noncompliance. CBGs improved.  He was transitioned to subcutaneous insulin.  Was initially ordered Levemir but it appears that it was changed over to glargine sometime yesterday.  CBG noted to be 132 this morning.  We will  cut back on the dose of glargine.  Continue to monitor for now.  Acute metabolic encephalopathy Patient likely has underlying cognitive issues.  Chronic ischemic changes noted on CT head.  TSH noted to be normal.  Vitamin B12 was 470.  Folate 7.5.  RPR is nonreactive.   Mentation has significantly improved.  Likely back to baseline.  He will benefit from being seen by neurology in the outpatient setting to undergo cognitive testing.    Essential hypertension As discussed above patient with septic shock.  Antihypertensives on hold.  Blood pressures are stable.  Dyslipidemia Continue Lipitor.  History of GERD Continue Protonix.  History of BPH Noted to be on tamsulosin which is being continued.  Hypophosphatemia Potassium phosphate was given.  Improvement in phosphorus levels noted.  Magnesium was 1.7.  Normocytic anemia Some drop in hemoglobin is likely dilutional.  No overt bleeding noted.  Continue to monitor.  Physical deconditioning with falls at home No injuries noted on imaging studies.  He is able to move his extremities without problem.  Seen by PT and OT.  Home health is recommended.   DVT Prophylaxis: Subcutaneous heparin Code Status: Full code Family Communication: No family at bedside.  Patient prefers that we speak to Hildred Alamin who is his granddaughter.  [phone].  Could not reach her yesterday.  He suggests calling [name]. Disposition Plan: Home with home health when stable.  Status is: Inpatient  Remains inpatient appropriate because:Hemodynamically unstable, IV treatments appropriate due to intensity of illness or inability to take PO, and Inpatient level of care appropriate due to severity of illness  Dispo: The patient is from: Home              Anticipated d/c is to: Home              Patient currently is not medically stable to d/c.   Difficult to place patient No      Medications: Scheduled:  aspirin EC  81 mg Oral Daily   atorvastatin  80 mg Oral  Daily   azithromycin  500 mg Oral Daily   Chlorhexidine Gluconate Cloth  6 each Topical Daily   feeding supplement (NEPRO CARB STEADY)  237 mL Oral TID BM   heparin  5,000 Units Subcutaneous Q8H   hydrocortisone sod succinate (SOLU-CORTEF) inj  50 mg Intravenous Q8H   insulin aspart  0-20 Units Subcutaneous TID AC & HS   insulin glargine-yfgn  25 Units Subcutaneous Daily   midodrine  10 mg Oral TID WC   multivitamin with minerals  1 tablet Oral Daily   omega-3 acid ethyl esters  1,000 mg Oral Daily   pantoprazole  40 mg Oral Daily   sodium chloride flush  10-40 mL Intracatheter Q12H   tamsulosin  0.4 mg Oral Daily   Continuous:  ceFEPime (MAXIPIME) IV Stopped (03/03/21 0953)     Objective:  Vital Signs  Vitals:   03/03/21 0400 03/03/21 0500 03/03/21 0600 03/03/21 0755  BP: 110/72  114/70 112/74 110/78  Pulse: (!) 56 (!) 54 (!) 58 88  Resp: '20 17 18 '$ (!) 26  Temp: 98.4 F (36.9 C)   (!) 97.5 F (36.4 C)  TempSrc: Oral   Oral  SpO2: 95% 95% 95% 94%  Weight:      Height:        Intake/Output Summary (Last 24 hours) at 03/03/2021 1127 Last data filed at 03/03/2021 1000 Gross per 24 hour  Intake 3803.36 ml  Output 3475 ml  Net 328.36 ml    Filed Weights   02/28/21 0444  Weight: 78 kg    General appearance: Awake alert.  In no distress Resp: Normal effort.  Coarse breath sounds with few crackles at the bases.  No wheezing or rhonchi. Cardio: S1-S2 is normal regular.  No S3-S4.  No rubs murmurs or bruit GI: Abdomen is soft.  Nontender nondistended.  Bowel sounds are present normal.  No masses organomegaly Extremities: No edema.  Full range of motion of lower extremities. Neurologic: No focal neurological deficits.     Lab Results:  Data Reviewed: I have personally reviewed following labs and imaging studies  CBC: Recent Labs  Lab 02/28/21 0452 03/01/21 0415 03/02/21 0326 03/03/21 0514  WBC 10.5 10.4 9.7 9.0  NEUTROABS 5.4 7.0 7.1 5.8  HGB 15.2 11.0* 12.8*  11.2*  HCT 44.3 32.4* 37.7* 33.8*  MCV 89.7 92.8 91.3 92.9  PLT 259 176 195 178     Basic Metabolic Panel: Recent Labs  Lab 02/28/21 2124 03/01/21 0415 03/01/21 0542 03/02/21 0326 03/03/21 0514  NA 133* 132* 137 140 136  K 3.7 3.6 3.7 4.2 3.8  CL 100 102 105 105 106  CO2 '24 23 27 28 27  '$ GLUCOSE 149* 295* 173* 144* 175*  BUN '21 15 18 20 23  '$ CREATININE 1.36* 1.01 1.28* 1.14 1.17  CALCIUM 8.8* 7.9* 8.8* 8.9 8.6*  MG  --  1.3* 1.7 1.8 1.7  PHOS  --  1.4* 1.9* 2.5 3.0     GFR: Estimated Creatinine Clearance: 51.2 mL/min (by C-G formula based on SCr of 1.17 mg/dL).  Liver Function Tests: Recent Labs  Lab 02/28/21 0452  AST 15  ALT 11  ALKPHOS 98  BILITOT 1.5*  PROT 6.9  ALBUMIN 3.8       CBG: Recent Labs  Lab 03/02/21 0730 03/02/21 1130 03/02/21 1558 03/02/21 2131 03/03/21 0748  GLUCAP 91 238* 255* 279* 132*       Recent Results (from the past 240 hour(s))  Resp Panel by RT-PCR (Flu A&B, Covid) Nasopharyngeal Swab     Status: None   Collection Time: 02/28/21  4:52 AM   Specimen: Nasopharyngeal Swab; Nasopharyngeal(NP) swabs in vial transport medium  Result Value Ref Range Status   SARS Coronavirus 2 by RT PCR NEGATIVE NEGATIVE Final    Comment: (NOTE) SARS-CoV-2 target nucleic acids are NOT DETECTED.  The SARS-CoV-2 RNA is generally detectable in upper respiratory specimens during the acute phase of infection. The lowest concentration of SARS-CoV-2 viral copies this assay can detect is 138 copies/mL. A negative result does not preclude SARS-Cov-2 infection and should not be used as the sole basis for treatment or other patient management decisions. A negative result may occur with  improper specimen collection/handling, submission of specimen other than nasopharyngeal swab, presence of viral mutation(s) within the areas targeted by this assay, and inadequate number of viral copies(<138 copies/mL). A negative result must be combined  with clinical observations, patient history, and epidemiological information. The  expected result is Negative.  Fact Sheet for Patients:  EntrepreneurPulse.com.au  Fact Sheet for Healthcare Providers:  IncredibleEmployment.be  This test is no t yet approved or cleared by the [place] FDA and  has been authorized for detection and/or diagnosis of SARS-CoV-2 by FDA under an Emergency Use Authorization (EUA). This EUA will remain  in effect (meaning this test can be used) for the duration of the COVID-19 declaration under Section 564(b)(1) of the Act, 21 U.S.C.section 360bbb-3(b)(1), unless the authorization is terminated  or revoked sooner.       Influenza A by PCR NEGATIVE NEGATIVE Final   Influenza B by PCR NEGATIVE NEGATIVE Final    Comment: (NOTE) The Xpert Xpress SARS-CoV-2/FLU/RSV plus assay is intended as an aid in the diagnosis of influenza from Nasopharyngeal swab specimens and should not be used as a sole basis for treatment. Nasal washings and aspirates are unacceptable for Xpert Xpress SARS-CoV-2/FLU/RSV testing.  Fact Sheet for Patients: EntrepreneurPulse.com.au  Fact Sheet for Healthcare Providers: IncredibleEmployment.be  This test is not yet approved or cleared by the [place] FDA and has been authorized for detection and/or diagnosis of SARS-CoV-2 by FDA under an Emergency Use Authorization (EUA). This EUA will remain in effect (meaning this test can be used) for the duration of the COVID-19 declaration under Section 564(b)(1) of the Act, 21 U.S.C. section 360bbb-3(b)(1), unless the authorization is terminated or revoked.  Performed at [facility], [address] [place], [place] [address]   Blood culture (routine x 2)     Status: None (Preliminary result)   Collection Time: 02/28/21  6:40 AM   Specimen: BLOOD  Result Value Ref Range Status   Specimen  Description BLOOD RIGHT ASSIST CONTROL  Final   Special Requests   Final    BOTTLES DRAWN AEROBIC AND ANAEROBIC Blood Culture adequate volume   Culture   Final    NO GROWTH 3 DAYS Performed at [facility], [address], [place], [place] [address]    Report Status PENDING  Incomplete  Blood culture (routine x 2)     Status: None (Preliminary result)   Collection Time: 02/28/21  6:45 AM   Specimen: BLOOD  Result Value Ref Range Status   Specimen Description BLOOD LEFT HAND  Final   Special Requests   Final    BOTTLES DRAWN AEROBIC AND ANAEROBIC Blood Culture adequate volume   Culture   Final    NO GROWTH 3 DAYS Performed at [facility], [address] [place], [place] [address]    Report Status PENDING  Incomplete  MRSA Next Gen by PCR, Nasal     Status: None   Collection Time: 02/28/21 12:15 PM   Specimen: Nasal Mucosa; Nasal Swab  Result Value Ref Range Status   MRSA by PCR Next Gen NOT DETECTED NOT DETECTED Final    Comment: (NOTE) The GeneXpert MRSA Assay (FDA approved for NASAL specimens only), is one component of a comprehensive MRSA colonization surveillance program. It is not intended to diagnose MRSA infection nor to guide or monitor treatment for MRSA infections. Test performance is not FDA approved in patients less than 66 years old. Performed at [facility], [address], [place], [place] [address]        Radiology Studies: No results found.     LOS: 3 days   Kenan Moodie [employer] on [email or web address]  03/03/2021, 11:27 AM

## 2021-03-03 NOTE — TOC Progression Note (Addendum)
Transition of Care (TOC) - Progression Note    Patient Details  Name: Scott Lawson. MRN: [id number] Date of Birth: May 09, 1941  Transition of Care [facility]) CM/SW Contact  Anselm Pancoast, RN Phone Number: 03/03/2021, 5:25 PM  Clinical Narrative:    Received message to call [name]-granddaughter @ [phone].   Spoke to [name] and discussed concerns regarding patient being home alone. [name] is not interested in placement and states she will do whatever it takes to care for her grandfather. Other family members are not offering any assistance for her. 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 Discussed need for Medicaid to assist with cost of PCS services and [name] became tearful stating she feels overwhelmed and is not sure where to start. RN CM provided number for [facility] to assist with some of her concerns. RN CM provided [name] with multiple PCS agencies that may be able to assist. [name] is very appreciative of assistance and states she understands her grandfather can be mean to the staff but she is going to do everything she can to care for him. Marye Round is currently trying to locate patients wallet to find his ID and insurance card but has not had any success. She has outreached to her mother and her aunt who had assisted in his care previously to find out if it was left at their house.   RN CM will work to obtain [facility] to include PT/OT and RN to assist with discharge home as well. Currently patient makes around $1200/monthly so unclear if will meet criteria for Medicaid.      Expected Discharge Plan: [place] Barriers to Discharge: Continued Medical Work up  Expected Discharge Plan and Services Expected Discharge Plan: [place]   Discharge Planning Services: CM Consult   Living  arrangements for the past 2 months: Single Family Home                 DME Arranged: N/A DME Agency: NA                   Social Determinants of Health (SDOH) Interventions    Readmission Risk Interventions No flowsheet data found.

## 2021-03-03 NOTE — Consult Note (Signed)
[place] for Electrolyte Monitoring and Replacement   Recent Labs: Potassium (mmol/L)  Date Value  03/03/2021 3.8  07/17/2014 4.2   Magnesium (mg/dL)  Date Value  03/03/2021 1.7  07/16/2014 1.9   Calcium (mg/dL)  Date Value  03/03/2021 8.6 (L)   Calcium, Total (mg/dL)  Date Value  07/17/2014 8.5   Albumin (g/dL)  Date Value  02/28/2021 3.8  02/04/2021 3.9   Phosphorus (mg/dL)  Date Value  03/03/2021 3.0   Sodium (mmol/L)  Date Value  03/03/2021 136  02/04/2021 129 (L)  07/17/2014 136    Assessment: 80YOM PMH T2DM on insulin at home, HTN, HLD, presenting to the ED s/p 2 falls at home. He met criteria for HHS, therefore he was given 4 L of Lactated Ringer's and was placed on an insulin drip. Patient initially poorly responsive to fluid bolusing, requiring initiation of Levophed gtt and transfer to ICU for further management. Pharmacy consulted to assist with electrolyte monitoring and replacement as indicated.  Patient has been transitioned off of insulin infusion and is now in [place] insulin regimen.  Goal of Therapy:  Electrolytes within normal limits  Plan:  --Mg 1.7, IV magnesium sulfate 2 g x 1 --No further electrolyte replacement warranted at this time --Will follow-up electrolytes with AM labs tomorrow  Benita Gutter  03/03/2021 9:30 AM

## 2021-03-04 ENCOUNTER — Telehealth: Payer: Medicare Other

## 2021-03-04 DIAGNOSIS — G9341 Metabolic encephalopathy: Secondary | ICD-10-CM | POA: Diagnosis not present

## 2021-03-04 LAB — BASIC METABOLIC PANEL
Anion gap: 8 (ref 5–15)
BUN: 24 mg/dL — ABNORMAL HIGH (ref 8–23)
CO2: 25 mmol/L (ref 22–32)
Calcium: 8.7 mg/dL — ABNORMAL LOW (ref 8.9–10.3)
Chloride: 103 mmol/L (ref 98–111)
Creatinine, Ser: 1.06 mg/dL (ref 0.61–1.24)
GFR, Estimated: >60 mL/min (ref >60)
Glucose, Bld: 306 mg/dL — ABNORMAL HIGH (ref 70–99)
Potassium: 3.7 mmol/L (ref 3.5–5.1)
Sodium: 136 mmol/L (ref 135–145)

## 2021-03-04 LAB — GLUCOSE, CAPILLARY
Glucose-Capillary: 236 mg/dL — ABNORMAL HIGH (ref 70–99)
Glucose-Capillary: 314 mg/dL — ABNORMAL HIGH (ref 70–99)

## 2021-03-04 LAB — MAGNESIUM: Magnesium: 2.1 mg/dL (ref 1.7–2.4)

## 2021-03-04 LAB — PHOSPHORUS: Phosphorus: 3 mg/dL (ref 2.5–4.6)

## 2021-03-04 LAB — MYCOPLASMA PNEUMONIAE ANTIBODY, IGM: Mycoplasma pneumo IgM: <770 U/mL (ref 0–769)

## 2021-03-04 MED ORDER — AZITHROMYCIN 500 MG PO TABS: 500.0000 mg | ORAL_TABLET | Freq: Every day | ORAL | 0 refills | Status: AC

## 2021-03-04 MED ORDER — CEFDINIR 300 MG PO CAPS: 300.0000 mg | ORAL_CAPSULE | Freq: Two times a day (BID) | ORAL | 0 refills | Status: AC

## 2021-03-04 MED ORDER — PREDNISONE 20 MG PO TABS: ORAL_TABLET | ORAL | 0 refills | Status: DC

## 2021-03-04 MED ORDER — MIDODRINE HCL 10 MG PO TABS: 10.0000 mg | ORAL_TABLET | Freq: Three times a day (TID) | ORAL | 1 refills | Status: AC

## 2021-03-04 NOTE — Progress Notes (Signed)
Pt received discharge orders early afternoon. Family called multiple times w/ no answer.   Near 1600, Hailey (Great Granddaughter) called me. Pick up time coordinated. Discharge instructions and packet (AVS) reviewed w/ Hailey over phone.   Pt discharged @ 1730 in gown d/t soiled clothes. Pt requested to throw away clothes soiled w/ urine and go home in gown.

## 2021-03-04 NOTE — Discharge Summary (Signed)
[facility]  Physician Discharge Summary   Patient ID: Scott Lawson. MRN: [id number] DOB/AGE: 79-Aug-1942 79 y.o.  Admit date: 02/28/2021 Discharge date:   03/04/2021   PCP: Venita Lick, NP  DISCHARGE DIAGNOSES:  Shock likely due to combination of sepsis as well as hypovolemia Community-acquired pneumonia Acute respiratory failure with hypoxia, resolved Acute kidney injury, resolved Hyperosmolar hyperglycemic state, resolved Insulin-dependent diabetes mellitus, uncontrolled with hyperglycemia, noncompliant Acute metabolic encephalopathy, resolved Essential hypertension Dyslipidemia Normocytic anemia  RECOMMENDATIONS FOR OUTPATIENT FOLLOW UP: Home health has been ordered Consider ambulatory referral to neurology to assess cognitive function   Home Health: PT and OT Equipment/Devices: Rolling walker  CODE STATUS: Full code  DISCHARGE CONDITION: fair  Diet recommendation: Modified carbohydrate  INITIAL HISTORY:  80 y.o. male with a past medical history of insulin-dependent diabetes mellitus type 2, hypertension, dyslipidemia, BPH, GERD.  Presented to the emergency department after sustaining 2 falls at home.  Did not want to come to the hospital after his initial fall.  Patient noted to be a poor historian.  He was found to have severe hyperglycemia requiring IV insulin infusion.  Subsequently became hypotensive with concern for sepsis from pneumonia.  He was admitted to the ICU.  Seen by critical care medicine.  Started on broad-spectrum antibiotics.  Required initiation of vasopressors as well.   Consultants: Critical care medicine   Procedures: PICC line placement.  Will be removed prior to discharge Central line placement (patient pulled out central line)   HOSPITAL COURSE:   Shock, septic versus hypovolemic versus combination/community-acquired pneumonia/acute respiratory failure with hypoxia Hypotension was refractory to aggressive IV fluid  hydration.  Subsequently started on vasopressors.  Critical care medicine was consulted.  Levophed was titrated off.  Blood pressure has been stable.  Lactic acid level peaked at 5.3 and improved to 1.5.  Concern was raised for pneumonia on imaging studies.  Procalcitonin however was only 0.11.  WBC was normal.  MRSA PCR was nondetected.  Initially placed on vancomycin and cefepime.  Azithromycin was added.  Due to negative blood cultures vancomycin was discontinued.  weaned off of oxygen. Will be discharged on Omnicef and azithromycin.  Midodrine will be continued for now due to borderline low blood pressures.   Acute kidney injury Presented with a creatinine of 2.13.  Baseline is around 1.1.  Likely due to combination of sepsis and hypovolemia.  Was aggressively hydrated.  Renal function is back to baseline.    Hyperosmolar hyperglycemic state/history of insulin-dependent diabetes mellitus, uncontrolled Presented with a significantly elevated glucose levels of greater than 700.  Unclear if this is due to noncompliance or not.  Placed on insulin infusion. HbA1c greater than 14 implying very poor control and likely noncompliance. CBGs have improved.  He may resume his home medication regimen.  Compliance was emphasized to both him and his granddaughter.  Acute metabolic encephalopathy Patient likely has underlying cognitive issues.  Chronic ischemic changes noted on CT head.  TSH noted to be normal.  Vitamin B12 was 470.  Folate 7.5.  RPR is nonreactive.   Mentation has significantly improved.  Likely back to baseline.  He will benefit from being seen by neurology in the outpatient setting to undergo cognitive testing.    Essential hypertension Continue to keep his antihypertensives on hold for now.  Dyslipidemia Continue Lipitor.  History of GERD Continue Protonix.  History of BPH Noted to be on tamsulosin which is being continued.   Hypophosphatemia Potassium phosphate was given.   Improvement  in phosphorus levels noted.  Magnesium was 1.7.   Normocytic anemia Some drop in hemoglobin is likely dilutional.  No overt bleeding noted.     Physical deconditioning with falls at home Home health.  Overall stable.  Okay for discharge home today.   PERTINENT LABS:  The results of significant diagnostics from this hospitalization (including imaging, microbiology, ancillary and laboratory) are listed below for reference.    Microbiology: Recent Results (from the past 240 hour(s))  Resp Panel by RT-PCR (Flu A&B, Covid) Nasopharyngeal Swab     Status: None   Collection Time: 02/28/21  4:52 AM   Specimen: Nasopharyngeal Swab; Nasopharyngeal(NP) swabs in vial transport medium  Result Value Ref Range Status   SARS Coronavirus 2 by RT PCR NEGATIVE NEGATIVE Final    Comment: (NOTE) SARS-CoV-2 target nucleic acids are NOT DETECTED.  The SARS-CoV-2 RNA is generally detectable in upper respiratory specimens during the acute phase of infection. The lowest concentration of SARS-CoV-2 viral copies this assay can detect is 138 copies/mL. A negative result does not preclude SARS-Cov-2 infection and should not be used as the sole basis for treatment or other patient management decisions. A negative result may occur with  improper specimen collection/handling, submission of specimen other than nasopharyngeal swab, presence of viral mutation(s) within the areas targeted by this assay, and inadequate number of viral copies(<138 copies/mL). A negative result must be combined with clinical observations, patient history, and epidemiological information. The expected result is Negative.  Fact Sheet for Patients:  EntrepreneurPulse.com.au  Fact Sheet for Healthcare Providers:  IncredibleEmployment.be  This test is no t yet approved or cleared by the [place] FDA and  has been authorized for detection and/or diagnosis of SARS-CoV-2 by FDA under  an Emergency Use Authorization (EUA). This EUA will remain  in effect (meaning this test can be used) for the duration of the COVID-19 declaration under Section 564(b)(1) of the Act, 21 U.S.C.section 360bbb-3(b)(1), unless the authorization is terminated  or revoked sooner.       Influenza A by PCR NEGATIVE NEGATIVE Final   Influenza B by PCR NEGATIVE NEGATIVE Final    Comment: (NOTE) The Xpert Xpress SARS-CoV-2/FLU/RSV plus assay is intended as an aid in the diagnosis of influenza from Nasopharyngeal swab specimens and should not be used as a sole basis for treatment. Nasal washings and aspirates are unacceptable for Xpert Xpress SARS-CoV-2/FLU/RSV testing.  Fact Sheet for Patients: EntrepreneurPulse.com.au  Fact Sheet for Healthcare Providers: IncredibleEmployment.be  This test is not yet approved or cleared by the [place] FDA and has been authorized for detection and/or diagnosis of SARS-CoV-2 by FDA under an Emergency Use Authorization (EUA). This EUA will remain in effect (meaning this test can be used) for the duration of the COVID-19 declaration under Section 564(b)(1) of the Act, 21 U.S.C. section 360bbb-3(b)(1), unless the authorization is terminated or revoked.  Performed at [facility], [address] [place], [place] [address]   Blood culture (routine x 2)     Status: None (Preliminary result)   Collection Time: 02/28/21  6:40 AM   Specimen: BLOOD  Result Value Ref Range Status   Specimen Description BLOOD RIGHT ASSIST CONTROL  Final   Special Requests   Final    BOTTLES DRAWN AEROBIC AND ANAEROBIC Blood Culture adequate volume   Culture   Final    NO GROWTH 4 DAYS Performed at [facility], [address], [place], [place] [address]    Report Status PENDING  Incomplete  Blood culture (routine x 2)     Status: None (Preliminary result)   Collection Time: 02/28/21  6:45 AM   Specimen: BLOOD   Result Value Ref Range Status   Specimen Description BLOOD LEFT HAND  Final   Special Requests   Final    BOTTLES DRAWN AEROBIC AND ANAEROBIC Blood Culture adequate volume   Culture   Final    NO GROWTH 4 DAYS Performed at [facility], [address] [place], [place] [address]    Report Status PENDING  Incomplete  MRSA Next Gen by PCR, Nasal     Status: None   Collection Time: 02/28/21 12:15 PM   Specimen: Nasal Mucosa; Nasal Swab  Result Value Ref Range Status   MRSA by PCR Next Gen NOT DETECTED NOT DETECTED Final    Comment: (NOTE) The GeneXpert MRSA Assay (FDA approved for NASAL specimens only), is one component of a comprehensive MRSA colonization surveillance program. It is not intended to diagnose MRSA infection nor to guide or monitor treatment for MRSA infections. Test performance is not FDA approved in patients less than 57 years old. Performed at [facility], [address] [place], [place] [address]      Labs:  COVID-19 Labs   Lab Results  Component Value Date   [place] 02/28/2021   [place] NEGATIVE 02/04/2021      Basic Metabolic Panel: Recent Labs  Lab 03/01/21 0415 03/01/21 0542 03/02/21 0326 03/03/21 0514 03/04/21 0353  NA 132* 137 140 136 136  K 3.6 3.7 4.2 3.8 3.7  CL 102 105 105 106 103  CO2 23 27 28 27 25   GLUCOSE 295* 173* 144* 175* 306*  BUN 15 18 20 23  24*  CREATININE 1.01 1.28* 1.14 1.17 1.06  CALCIUM 7.9* 8.8* 8.9 8.6* 8.7*  MG 1.3* 1.7 1.8 1.7 2.1  PHOS 1.4* 1.9* 2.5 3.0 3.0   Liver Function Tests: Recent Labs  Lab 02/28/21 0452  AST 15  ALT 11  ALKPHOS 98  BILITOT 1.5*  PROT 6.9  ALBUMIN 3.8    CBC: Recent Labs  Lab 02/28/21 0452 03/01/21 0415 03/02/21 0326 03/03/21 0514  WBC 10.5 10.4 9.7 9.0  NEUTROABS 5.4 7.0 7.1 5.8  HGB 15.2 11.0* 12.8* 11.2*  HCT 44.3 32.4* 37.7* 33.8*  MCV 89.7 92.8 91.3 92.9  PLT 259 176 195 178    BNP: BNP (last 3 results) Recent Labs     06/20/20 2056 07/10/20 0657  BNP 168.9* 94.5    CBG: Recent Labs  Lab 03/03/21 1133 03/03/21 1547 03/03/21 2217 03/04/21 0810 03/04/21 1149  GLUCAP 201* 186* 301* 236* 314*     IMAGING STUDIES CT HEAD WO CONTRAST (5MM)  Result Date: 03/01/2021 CLINICAL DATA:  Head trauma EXAM: CT HEAD WITHOUT CONTRAST CT CERVICAL SPINE WITHOUT CONTRAST TECHNIQUE: Multidetector CT imaging of the head and cervical spine was performed following the standard protocol without intravenous contrast. Multiplanar CT image reconstructions of the cervical spine were also generated. COMPARISON:  None. FINDINGS: CT HEAD FINDINGS Brain: There is no mass, hemorrhage or extra-axial collection. The size and configuration of the ventricles and extra-axial CSF spaces are normal. There is hypoattenuation of the periventricular white matter, most commonly indicating chronic ischemic microangiopathy. Vascular: No abnormal hyperdensity of the major intracranial arteries or dural venous sinuses. No intracranial atherosclerosis. Skull: The visualized skull base, calvarium and extracranial soft tissues are normal. Sinuses/Orbits: No fluid levels or advanced mucosal thickening of the visualized paranasal sinuses. No mastoid or middle  ear effusion. The orbits are normal. CT CERVICAL SPINE FINDINGS Alignment: No static subluxation. Facets are aligned. Occipital condyles are normally positioned. Skull base and vertebrae: No acute fracture. Soft tissues and spinal canal: No prevertebral fluid or swelling. No visible canal hematoma. Disc levels: No advanced spinal canal or neural foraminal stenosis. Upper chest: No pneumothorax, pulmonary nodule or pleural effusion. Other: Normal visualized paraspinal cervical soft tissues. IMPRESSION: 1. Chronic ischemic microangiopathy without acute intracranial abnormality. 2. No acute fracture or static subluxation of the cervical spine. Electronically Signed   By: Ulyses Jarred M.D.   On: 03/01/2021 00:56    CT HEAD WO CONTRAST (5MM)  Result Date: 02/28/2021 CLINICAL DATA:  Fall.  Head trauma, minor (Age >= 65y) EXAM: CT HEAD WITHOUT CONTRAST TECHNIQUE: Contiguous axial images were obtained from the base of the skull through the vertex without intravenous contrast. COMPARISON:  02/04/2021 FINDINGS: Brain: There is atrophy and chronic small vessel disease changes. No acute intracranial abnormality. Specifically, no hemorrhage, hydrocephalus, mass lesion, acute infarction, or significant intracranial injury. Vascular: No hyperdense vessel or unexpected calcification. Skull: No acute calvarial abnormality. Sinuses/Orbits: No acute findings Other: None IMPRESSION: Atrophy, chronic microvascular disease. No acute intracranial abnormality. Electronically Signed   By: Rolm Baptise M.D.   On: 02/28/2021 08:13   CT HEAD WO CONTRAST (5MM)  Result Date: 02/04/2021 CLINICAL DATA:  Seizure EXAM: CT HEAD WITHOUT CONTRAST TECHNIQUE: Contiguous axial images were obtained from the base of the skull through the vertex without intravenous contrast. COMPARISON:  None. FINDINGS: Brain: Confluent hypodensities are seen throughout the periventricular and subcortical white matter, consistent with age-indeterminate small vessel ischemic changes. No other signs of acute infarct or hemorrhage. Lateral ventricles and midline structures are unremarkable. No acute extra-axial fluid collections. No mass effect. Vascular: No hyperdense vessel or unexpected calcification. Skull: Normal. Negative for fracture or focal lesion. Sinuses/Orbits: No acute finding. Other: None. IMPRESSION: 1. Confluent white matter hypodensities, consistent with age-indeterminate small vessel ischemic change, favor chronic. 2. Otherwise no acute intracranial process. Electronically Signed   By: Randa Ngo M.D.   On: 02/04/2021 18:06   CT CHEST WO CONTRAST  Result Date: 02/28/2021 CLINICAL DATA:  Chest pain and shortness of breath. EXAM: CT CHEST WITHOUT  CONTRAST TECHNIQUE: Multidetector CT imaging of the chest was performed following the standard protocol without IV contrast. COMPARISON:  CTA Chest 07/10/2020 FINDINGS: Cardiovascular: Heart size upper normal. Coronary artery calcification is evident. Moderate atherosclerotic calcification is noted in the wall of the thoracic aorta. Mediastinum/Nodes: No mediastinal lymphadenopathy. Stable 11 mm short axis AP window lymph node. Stable 11 mm short axis subcarinal lymph node. No evidence for gross hilar lymphadenopathy although assessment is limited by the lack of intravenous contrast on the current study. The esophagus has normal imaging features. There is no axillary lymphadenopathy. Lungs/Pleura: Architectural distortion and scarring is seen through both lungs with areas of subpleural reticulation bilaterally. The diffuse ground-glass opacity seen previously has decreased substantially in the interval. Basilar predominant traction bronchiectasis is noted in the lungs bilaterally no overtly suspicious nodule or mass although assessment limited by the degree of scarring. No pleural effusion. Upper Abdomen: 15 mm left adrenal nodule has low density consistent with adenoma. Musculoskeletal: No worrisome lytic or sclerotic osseous abnormality. IMPRESSION: 1. Marked interval decrease in the diffuse ground-glass opacity seen previously. 2. Areas of architectural distortion and scarring bilaterally with subpleural reticulation in basilar predominant traction bronchiectasis. Underlying component of pulmonary fibrosis suspected. High-resolution chest CT may prove helpful to further evaluate  as clinically warranted. 3. Stable mild mediastinal lymphadenopathy, likely reactive given the 9 month interval stability. 4. 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 5. Aortic Atherosclerosis (ICD10-I70.0). Electronically Signed   By: Misty Stanley M.D.   On: 02/28/2021 11:43   CT CERVICAL SPINE WO CONTRAST  Result Date: 03/01/2021 CLINICAL DATA:   Head trauma EXAM: CT HEAD WITHOUT CONTRAST CT CERVICAL SPINE WITHOUT CONTRAST TECHNIQUE: Multidetector CT imaging of the head and cervical spine was performed following the standard protocol without intravenous contrast. Multiplanar CT image reconstructions of the cervical spine were also generated. COMPARISON:  None. FINDINGS: CT HEAD FINDINGS Brain: There is no mass, hemorrhage or extra-axial collection. The size and configuration of the ventricles and extra-axial CSF spaces are normal. There is hypoattenuation of the periventricular white matter, most commonly indicating chronic ischemic microangiopathy. Vascular: No abnormal hyperdensity of the major intracranial arteries or dural venous sinuses. No intracranial atherosclerosis. Skull: The visualized skull base, calvarium and extracranial soft tissues are normal. Sinuses/Orbits: No fluid levels or advanced mucosal thickening of the visualized paranasal sinuses. No mastoid or middle ear effusion. The orbits are normal. CT CERVICAL SPINE FINDINGS Alignment: No static subluxation. Facets are aligned. Occipital condyles are normally positioned. Skull base and vertebrae: No acute fracture. Soft tissues and spinal canal: No prevertebral fluid or swelling. No visible canal hematoma. Disc levels: No advanced spinal canal or neural foraminal stenosis. Upper chest: No pneumothorax, pulmonary nodule or pleural effusion. Other: Normal visualized paraspinal cervical soft tissues. IMPRESSION: 1. Chronic ischemic microangiopathy without acute intracranial abnormality. 2. No acute fracture or static subluxation of the cervical spine. Electronically Signed   By: Ulyses Jarred M.D.   On: 03/01/2021 00:56   CT Cervical Spine Wo Contrast  Result Date: 02/28/2021 CLINICAL DATA:  Fall.  Neck trauma (Age >= 65y) EXAM: CT CERVICAL SPINE WITHOUT CONTRAST TECHNIQUE: Multidetector CT imaging of the cervical spine was performed without intravenous contrast. Multiplanar CT image  reconstructions were also generated. COMPARISON:  None. FINDINGS: Alignment: Normal Skull base and vertebrae: No acute fracture. No primary bone lesion or focal pathologic process. Soft tissues and spinal canal: No prevertebral fluid or swelling. No visible canal hematoma. Disc levels: Diffuse degenerative disc disease, most advanced from C4-5 through C6-7. Moderate bilateral degenerative facet disease, left greater than right. Multilevel bilateral neural foraminal narrowing. Upper chest: No acute findings Other: None IMPRESSION: Degenerative disc and facet disease.  No acute bony abnormality. Electronically Signed   By: Rolm Baptise M.D.   On: 02/28/2021 08:14   DG Chest Portable 1 View  Result Date: 02/28/2021 CLINICAL DATA:  Fall. EXAM: PORTABLE CHEST 1 VIEW COMPARISON:  06/24/2020 FINDINGS: 0616 hours. Patient rotated to the right. The cardio pericardial silhouette is enlarged. Interstitial markings are diffusely coarsened with chronic features. Superimpose patchy airspace disease noted bilaterally, right greater than left. No substantial pleural effusion. The visualized bony structures of the thorax show no acute abnormality. Telemetry leads overlie the chest. IMPRESSION: Stable exam. Diffuse interstitial opacity with patchy bibasilar airspace disease, right greater than left. Electronically Signed   By: Misty Stanley M.D.   On: 02/28/2021 06:31   DG Foot Complete Right  Result Date: 02/04/2021 CLINICAL DATA:  Right foot pain and swelling EXAM: RIGHT FOOT COMPLETE - 3+ VIEW COMPARISON:  None. FINDINGS: Frontal, oblique, lateral views of the right foot are obtained. 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 There is dorsal dislocation of the second proximal interphalangeal joint. No associated fracture. There is severe osteoarthritis at  the second distal interphalangeal joint. Mild joint space narrowing and osteophyte formation is seen within the remaining interphalangeal joints.  There is a moderate inferior calcaneal spur. Mild diffuse soft tissue swelling of the forefoot. IMPRESSION: 1. Dorsal dislocation of the second proximal interphalangeal joint. 2. 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 3. 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 4. Mild soft tissue swelling of the forefoot. Electronically Signed   By: Randa Ngo M.D.   On: 02/04/2021 20:03   ECHOCARDIOGRAM COMPLETE  Result Date: 02/05/2021    ECHOCARDIOGRAM REPORT   Patient Name:   Caine Barfield. Date of Exam: 02/05/2021 Medical Rec #:  [id number]             Height:       69.0 in Accession #:    [id number]            Weight:       173.8 lb Date of Birth:  [birth year]             BSA:          1.946 m Patient Age:    28 years              BP:           100/70 mmHg Patient Gender: M                     HR:           74 bpm. Exam Location:  [facility] Procedure: 2D Echo, Cardiac Doppler and Color Doppler Indications:     Syncope R55  History:         Patient has prior history of Echocardiogram examinations, most                  recent 06/23/2020. Risk Factors:Hypertension.  Sonographer:     Sherrie Sport Referring Phys:  [id number] [place] Diagnosing Phys: Yolonda Kida MD  Sonographer Comments: Suboptimal apical window. IMPRESSIONS  1. Left ventricular ejection fraction, by estimation, is 60 to 65%. The left ventricle has normal function. The left ventricle has no regional wall motion abnormalities. Left ventricular diastolic parameters were normal.  2. Right ventricular systolic function is normal. The right ventricular size is normal.  3. Left atrial size was mildly dilated.  4. The mitral valve is normal in structure. No evidence of mitral valve regurgitation.  5. The aortic valve is grossly normal. Aortic valve regurgitation is not visualized. FINDINGS  Left Ventricle: Left ventricular ejection fraction, by estimation, is 60 to 65%. The left ventricle has normal function. The left  ventricle has no regional wall motion abnormalities. The left ventricular internal cavity size was normal in size. There is  no left ventricular hypertrophy. Left ventricular diastolic parameters were normal. Right Ventricle: The right ventricular size is normal. No increase in right ventricular wall thickness. Right ventricular systolic function is normal. Left Atrium: Left atrial size was mildly dilated. Right Atrium: Right atrial size was normal in size. Pericardium: There is no evidence of pericardial effusion. Mitral Valve: The mitral valve is normal in structure. No evidence of mitral valve regurgitation. Tricuspid Valve: The tricuspid valve is normal in structure. Tricuspid valve regurgitation is not demonstrated. Aortic Valve: The aortic valve is grossly normal. Aortic valve regurgitation is not visualized. Aortic valve mean gradient measures 6.0 mmHg. Aortic valve peak gradient measures 10.6 mmHg. Aortic valve area, by VTI measures 2.44 cm. Pulmonic Valve:  The pulmonic valve was normal in structure. Pulmonic valve regurgitation is not visualized. Aorta: The ascending aorta was not well visualized. IAS/Shunts: No atrial level shunt detected by color flow Doppler.  LEFT VENTRICLE PLAX 2D LVIDd:         4.10 cm  Diastology LVIDs:         2.70 cm  LV e' medial:    3.26 cm/s LV PW:         1.40 cm  LV E/e' medial:  32.2 LV IVS:        1.70 cm  LV e' lateral:   5.77 cm/s LVOT diam:     2.30 cm  LV E/e' lateral: 18.2 LV SV:         67 LV SV Index:   34 LVOT Area:     4.15 cm  RIGHT VENTRICLE RV S prime:     13.60 cm/s TAPSE (M-mode): 4.2 cm LEFT ATRIUM             Index       RIGHT ATRIUM           Index LA diam:        3.30 cm 1.70 cm/m  RA Area:     11.10 cm LA Vol (A2C):   73.8 ml 37.92 ml/m RA Volume:   22.30 ml  11.46 ml/m LA Vol (A4C):   94.3 ml 48.45 ml/m LA Biplane Vol: 86.9 ml 44.65 ml/m  AORTIC VALVE                    PULMONIC VALVE AV Area (Vmax):    1.96 cm     PV Vmax:        0.87 m/s AV  Area (Vmean):   1.79 cm     PV Peak grad:   3.0 mmHg AV Area (VTI):     2.44 cm     RVOT Peak grad: 3 mmHg AV Vmax:           163.00 cm/s AV Vmean:          117.500 cm/s AV VTI:            0.274 m AV Peak Grad:      10.6 mmHg AV Mean Grad:      6.0 mmHg LVOT Vmax:         77.00 cm/s LVOT Vmean:        50.700 cm/s LVOT VTI:          0.161 m LVOT/AV VTI ratio: 0.59  AORTA Ao Root diam: 4.17 cm MITRAL VALVE                TRICUSPID VALVE MV Area (PHT): 2.87 cm     TR Peak grad:   19.2 mmHg MV Decel Time: 264 msec     TR Vmax:        219.00 cm/s MV E velocity: 105.00 cm/s MV A velocity: 93.00 cm/s   SHUNTS MV E/A ratio:  1.13         Systemic VTI:  0.16 m                             Systemic Diam: 2.30 cm Yolonda Kida MD Electronically signed by Yolonda Kida MD Signature Date/Time: 02/05/2021/2:13:06 PM    Final    [place] EKG SITE RITE  Result Date: 02/28/2021 If Site Rite image not attached, placement could not  be confirmed due to current cardiac rhythm.   DISCHARGE EXAMINATION: Vitals:   03/03/21 1935 03/04/21 0552 03/04/21 0800 03/04/21 1111  BP: 113/87 134/85 121/82   Pulse:   (!) 57   Resp:      Temp: 97.7 F (36.5 C) 97.8 F (36.6 C) 97.9 F (36.6 C)   TempSrc: Oral Oral Oral   SpO2: 95%  95% 95%  Weight:      Height:       General appearance: Awake alert.  In no distress Resp: Clear to auscultation bilaterally.  Normal effort Cardio: S1-S2 is normal regular.  No S3-S4.  No rubs murmurs or bruit GI: Abdomen is soft.  Nontender nondistended.  Bowel sounds are present normal.  No masses organomegaly    DISPOSITION: Home with home health  Discharge Instructions     Call MD for:  difficulty breathing, headache or visual disturbances   Complete by: As directed    Call MD for:  extreme fatigue   Complete by: As directed    Call MD for:  persistant dizziness or light-headedness   Complete by: As directed    Call MD for:  persistant nausea and vomiting   Complete by: As  directed    Call MD for:  severe uncontrolled pain   Complete by: As directed    Call MD for:  temperature >100.4   Complete by: As directed    Diet Carb Modified   Complete by: As directed    Discharge instructions   Complete by: As directed    Please take your medications as prescribed.  Please be sure to check your glucose levels at home at least 3 times a day before each meal.  Please take your insulin as prescribed.  Do not miss any doses.  Seek attention if your glucose levels are consistently greater than 200.  You were cared for by a hospitalist during your hospital stay. If you have any questions about your discharge medications or the care you received while you were in the hospital after you are discharged, you can call the unit and asked to speak with the hospitalist on call if the hospitalist that took care of you is not available. Once you are discharged, your primary care physician will handle any further medical issues. Please note that NO REFILLS for any discharge medications will be authorized once you are discharged, as it is imperative that you return to your primary care physician (or establish a relationship with a primary care physician if you do not have one) for your aftercare needs so that they can reassess your need for medications and monitor your lab values. If you do not have a primary care physician, you can call [phone] for a physician referral.   Increase activity slowly   Complete by: As directed         Allergies as of 03/04/2021       Reactions   Gabapentin Other (See Comments)   "made me fall out"        Medication List     STOP taking these medications    lisinopril 5 MG tablet Commonly known as: ZESTRIL       TAKE these medications    albuterol 108 (90 Base) MCG/ACT inhaler Commonly known as: VENTOLIN HFA Inhale 2 puffs into the lungs every 6 (six) hours as needed for wheezing or shortness of breath.   aspirin EC 81 MG tablet Take  81 mg by mouth daily. Swallow whole.  atorvastatin 80 MG tablet Commonly known as: LIPITOR TAKE 1 TABLET EVERY DAY   azithromycin 500 MG tablet Commonly known as: ZITHROMAX Take 1 tablet (500 mg total) by mouth daily for 2 days.   cefdinir 300 MG capsule Commonly known as: OMNICEF Take 1 capsule (300 mg total) by mouth every 12 (twelve) hours for 3 days.   fluticasone 50 MCG/ACT nasal spray Commonly known as: FLONASE Place 2 sprays into both nostrils daily as needed.   insulin glargine 100 UNIT/ML injection Commonly known as: LANTUS Inject 0.35 mLs (35 Units total) into the skin every evening. 1800   insulin lispro 100 UNIT/ML KwikPen Commonly known as: HumaLOG KwikPen Inject 15 Units into the skin 3 (three) times daily before meals. Continue to check blood sugar after meals. What changed: when to take this   metFORMIN 500 MG tablet Commonly known as: GLUCOPHAGE Take 2 tablets (1,000 mg total) by mouth 2 (two) times daily with a meal. 1000mg  in the morning 1000mg  at night   midodrine 10 MG tablet Commonly known as: PROAMATINE Take 1 tablet (10 mg total) by mouth 3 (three) times daily with meals.   OMEGA 3 500 PO Take 500 mg by mouth daily.   omeprazole 20 MG capsule Commonly known as: PRILOSEC Take 1 capsule (20 mg total) by mouth daily.   predniSONE 20 MG tablet Commonly known as: DELTASONE Take 3 tablets once daily for 3 days followed by 2 tablets once daily for 3 days followed by 1 tablet once daily for 3 days and then stop   tamsulosin 0.4 MG Caps capsule Commonly known as: FLOMAX TAKE 1 CAPSULE EVERY DAY   True Metrix Blood Glucose Test test strip Generic drug: glucose blood CHECK BLOOD SUGAR TWICE DAILY   TRUEplus Lancets 33G Misc CHECK BLOOD SUGAR TWICE DAILY               Durable Medical Equipment  (From admission, onward)           Start     Ordered   03/04/21 1023  For home use only DME Walker rolling  Once       Question Answer  Comment  Walker: With [place]   Patient needs a walker to treat with the following condition Physical deconditioning      03/04/21 1023              Follow-up Information     Venita Lick, NP. Schedule an appointment as soon as possible for a visit in 1 week(s).   Specialty: Nurse Practitioner Contact information: [place] [address] [phone]                 TOTAL DISCHARGE TIME: 74 minutes  [place]  [facility] Pager on [email or web address]  03/04/2021, 12:55 PM

## 2021-03-04 NOTE — Consult Note (Signed)
[facility] for Electrolyte Monitoring and Replacement   Recent Labs: Potassium (mmol/L)  Date Value  03/04/2021 3.7  07/17/2014 4.2   Magnesium (mg/dL)  Date Value  03/04/2021 2.1  07/16/2014 1.9   Calcium (mg/dL)  Date Value  03/04/2021 8.7 (L)   Calcium, Total (mg/dL)  Date Value  07/17/2014 8.5   Albumin (g/dL)  Date Value  02/28/2021 3.8  02/04/2021 3.9   Phosphorus (mg/dL)  Date Value  03/04/2021 3.0   Sodium (mmol/L)  Date Value  03/04/2021 136  02/04/2021 129 (L)  07/17/2014 136    Assessment: 80YOM PMH T2DM on insulin at home, HTN, HLD, presenting to the ED s/p 2 falls at home. He met criteria for HHS, therefore he was given 4 L of Lactated Ringer's and was placed on an insulin drip. Patient initially poorly responsive to fluid bolusing, requiring initiation of Levophed gtt and transfer to ICU for further management. Pharmacy consulted to assist with electrolyte monitoring and replacement as indicated.  Patient has been transitioned off of insulin infusion and is now in [facility] insulin regimen.  Goal of Therapy:  Electrolytes within normal limits  Plan:  --No electrolyte replacement warranted at this time --Patient care transferred from PCCM to [facility]. Will discontinue electrolyte consult at this time. Defer further electrolyte replacement and ordering of labs to hospitalist --Pharmacy will continue to monitor peripherally  Benita Gutter  03/04/2021 8:20 AM

## 2021-03-04 NOTE — Progress Notes (Signed)
Inpatient Diabetes Program Recommendations  AACE/ADA: New Consensus Statement on Inpatient Glycemic Control   Target Ranges:  Prepandial:   less than 140 mg/dL      Peak postprandial:   less than 180 mg/dL (1-2 hours)      Critically ill patients:  140 - 180 mg/dL   Results for FAVIAN, KITTLESON (MRN [id number]) as of 03/04/2021 09:25  Ref. Range 03/03/2021 07:48 03/03/2021 11:33 03/03/2021 15:47 03/03/2021 22:17 03/04/2021 08:10  Glucose-Capillary Latest Ref Range: 70 - 99 mg/dL 132 (H) 201 (H) 186 (H) 301 (H) 236 (H)    Review of Glycemic Control   Current orders for Inpatient glycemic control: Current orders for Inpatient glycemic control: Semglee 20 units daily, Novolog 0-20 units TID with meals and bedtime; Solucortef 50 mg Q8H    Inpatient Diabetes Program Recommendations:    Insulin: Noted steroids decreased 03/03/21. If steroids are continued as currently ordered, please consider ordering Novolog 4 units TID with meals for meal coverage if patient eats at least 50% of meals.  Thanks, Barnie Alderman, RN, MSN, CDE Diabetes Coordinator Inpatient Diabetes Program [phone] (Team Pager from 8am to 5pm)

## 2021-03-04 NOTE — Progress Notes (Signed)
Physical Therapy Treatment Patient Details Name: Scott Lawson. MRN: [id number] DOB: May 18, 1941 Today's Date: 03/04/2021   History of Present Illness 80 y.o. Male admitted with HHS, multifactorial shock (hypovolemic vs. Septic), questionable pneumonia, and Acute Kidney Injury. He has a PMHx significant for T2DM, BPH, GERD, HTN, HLD.    PT Comments    Patient received in bed, RN present in room, states he is going home today. He is agreeable to PT.  Patient is independent with bed mobility, transfers with supervision and ambulated 200 feet with supervision, RW. He appears to be at or near baseline with mobility. He will continue to benefit from skilled PT while here to maximize mobility and independence.    Recommendations for follow up therapy are one component of a multi-disciplinary discharge planning process, led by the attending physician.  Recommendations may be updated based on patient status, additional functional criteria and insurance authorization.  Follow Up Recommendations  Home health PT;Supervision/Assistance - 24 hour     Equipment Recommendations  None recommended by PT;Other (comment) (i think he has a RW)    Recommendations for Other Services       Precautions / Restrictions Precautions Precautions: Fall Restrictions Weight Bearing Restrictions: No     Mobility  Bed Mobility Overal bed mobility: Modified Independent Bed Mobility: Supine to Sit     Supine to sit: Modified independent (Device/Increase time)          Transfers Overall transfer level: Needs assistance Equipment used: Rolling walker (2 wheeled) Transfers: Sit to/from Stand Sit to Stand: Supervision         General transfer comment: pt demonstrated safe hand placement  Ambulation/Gait Ambulation/Gait assistance: Supervision Gait Distance (Feet): 200 Feet Assistive device: Rolling walker (2 wheeled) Gait Pattern/deviations: Step-through pattern Gait velocity: normal    General Gait Details: patient ambulated well with RW, no lob. Patient is sob after walking but O2 sats at 95% once seated in recliner   Stairs             Wheelchair Mobility    Modified Rankin (Stroke Patients Only)       Balance Overall balance assessment: Modified Independent Sitting-balance support: Feet supported Sitting balance-Leahy Scale: Good Sitting balance - Comments: Good sitting balance at EOB   Standing balance support: Bilateral upper extremity supported;During functional activity Standing balance-Leahy Scale: Good                              Cognition Arousal/Alertness: Awake/alert Behavior During Therapy: WFL for tasks assessed/performed Overall Cognitive Status: No family/caregiver present to determine baseline cognitive functioning                                 General Comments: Patient very talkative, motivated to go home      Exercises      General Comments        Pertinent Vitals/Pain Pain Assessment: No/denies pain    Home Living                      Prior Function            PT Goals (current goals can now be found in the care plan section) Acute Rehab PT Goals Patient Stated Goal: to return home with granddaughter PT Goal Formulation: With patient Time For Goal Achievement: 03/16/21 Potential to Achieve Goals: Good Progress  towards PT goals: Progressing toward goals    Frequency    Min 2X/week      PT Plan Current plan remains appropriate    Co-evaluation              AM-PAC PT "6 Clicks" Mobility   Outcome Measure  Help needed turning from your back to your side while in a flat bed without using bedrails?: None Help needed moving from lying on your back to sitting on the side of a flat bed without using bedrails?: None Help needed moving to and from a bed to a chair (including a wheelchair)?: A Little Help needed standing up from a chair using your arms (e.g.,  wheelchair or bedside chair)?: A Little Help needed to walk in hospital room?: A Little Help needed climbing 3-5 steps with a railing? : A Little 6 Click Score: 20    End of Session Equipment Utilized During Treatment: Gait belt Activity Tolerance: Patient tolerated treatment well Patient left: in chair;with call bell/phone within reach Nurse Communication: Mobility status PT Visit Diagnosis: Muscle weakness (generalized) (M62.81);History of falling (Z91.81)     Time: 7618-4859 PT Time Calculation (min) (ACUTE ONLY): 16 min  Charges:  $Gait Training: 8-22 mins                     Jamisen Duerson, PT, GCS 03/04/21,11:18 AM

## 2021-03-05 ENCOUNTER — Ambulatory Visit: Payer: Medicare Other

## 2021-03-05 ENCOUNTER — Telehealth: Payer: Self-pay

## 2021-03-05 ENCOUNTER — Telehealth: Payer: Self-pay | Admitting: Licensed Clinical Social Worker

## 2021-03-05 LAB — CULTURE, BLOOD (ROUTINE X 2)
Culture: NO GROWTH
Culture: NO GROWTH
Special Requests: ADEQUATE
Special Requests: ADEQUATE

## 2021-03-05 NOTE — Telephone Encounter (Signed)
Transition Care Management Unsuccessful Follow-up Telephone Call  Date of discharge and from where:  03/04/2021 [facility]  Attempts:  1st Attempt  Reason for unsuccessful TCM follow-up call:  Left voice message

## 2021-03-05 NOTE — Telephone Encounter (Signed)
    Clinical Social Work  Chronic Care Management   Phone Outreach    03/05/2021 Name: Scott Lawson. MRN: [id number] DOB: 04-06-1941  Scott Lawson. is a 80 y.o. year old male who is a primary care patient of Cannady, Barbaraann Faster, NP .   Reason for referral: Level of Care Concerns.    CCM LCSW reached out to patient today by phone to introduce self, assess needs and offer Care Management services and interventions.    Telephone outreach was unsuccessful. A HIPPA compliant phone message was left for the patient providing contact information and requesting a return call.   Plan:CCM LCSW will wait for return call. If no return call is received, Will route chart to Care Guide to see if patient would like to reschedule phone appointment   Review of patient status, including review of consultants reports, relevant laboratory and other test results, and collaboration with appropriate care team members and the patient's provider was performed as part of comprehensive patient evaluation and provision of care management services.    Christa See, [email or web address] Phone [phone] 4:04 PM

## 2021-03-06 ENCOUNTER — Telehealth: Payer: Self-pay

## 2021-03-06 NOTE — Telephone Encounter (Signed)
Transition Care Management Unsuccessful Follow-up Telephone Call  Date of discharge and from where:  03/04/2021 [facility]  Attempts:  2nd Attempt  Reason for unsuccessful TCM follow-up call:  Left voice message

## 2021-03-06 NOTE — Telephone Encounter (Signed)
Per granddaughter [name]  Transition Care Management Follow-up Telephone Call Date of discharge and from where: 03/04/2021 [facility] How have you been since you were released from the hospital? Getting around pretty good Any questions or concerns? No  Items Reviewed: Did the pt receive and understand the discharge instructions provided? Yes  Medications obtained and verified? Yes  Other? No  Any new allergies since your discharge? No  Dietary orders reviewed? Yes Do you have support at home? Yes   Home Care and Equipment/Supplies: Were home health services ordered? yes If so, what is the name of the agency?   Has the agency set up a time to come to the patient's home? yes Were any new equipment or medical supplies ordered?  No What is the name of the medical supply agency? N/a Were you able to get the supplies/equipment? not applicable Do you have any questions related to the use of the equipment or supplies? No  Functional Questionnaire: (I = Independent and D = Dependent) ADLs: I  Bathing/Dressing- I  Meal Prep- D  Eating- I  Maintaining continence- I  Transferring/Ambulation- I  Managing Meds- D  Follow up appointments reviewed:  PCP Hospital f/u appt confirmed? Yes  Scheduled to see Vance Peper NP on 03/13/2021 @ 1:00. Are transportation arrangements needed? No  If their condition worsens, is the pt aware to call PCP or go to the Emergency Dept.? Yes Was the patient provided with contact information for the PCP's office or ED? Yes Was to pt encouraged to call back with questions or concerns? Yes

## 2021-03-08 ENCOUNTER — Telehealth: Payer: Self-pay

## 2021-03-08 NOTE — Telephone Encounter (Signed)
Contacted the patient do complete Medicare AWV and his granddaughter requested I call back in an hour because he is still asleep-I will call him back to complete this

## 2021-03-09 ENCOUNTER — Telehealth: Payer: Self-pay

## 2021-03-09 NOTE — Telephone Encounter (Signed)
Contacted the patient to schedule Medicare AWV-no answer I left a VM requesting a call back for scheduling

## 2021-03-10 ENCOUNTER — Ambulatory Visit: Payer: Medicare HMO | Admitting: Internal Medicine

## 2021-03-10 ENCOUNTER — Telehealth: Payer: Self-pay

## 2021-03-10 NOTE — Chronic Care Management (AMB) (Signed)
  Chronic Care Management   Note  03/10/2021 Name: Scott Lawson. MRN: [id number] DOB: 31-May-1941  [name] is a 80 y.o. year old male who is a primary care patient of Cannady, Barbaraann Faster, NP. [name] is currently enrolled in care management services. An additional referral for LCSW was placed.   Follow up plan: Unsuccessful telephone outreach attempt made. A HIPAA compliant phone message was left for the patient providing contact information and requesting a return call.  The care management team will reach out to the patient again over the next 7 days.  If patient returns call to provider office, please advise to call [place]  at [place], [place], [place], [place] [address] Direct Dial: [phone] [email or web address]

## 2021-03-10 NOTE — Progress Notes (Deleted)
Name: Scott Lawson.  MRN/ DOB: [id number], 02-28-1941   Age/ Sex: 80 y.o., male    PCP: Venita Lick, NP   Reason for Endocrinology Evaluation: Type 2 Diabetes Mellitus     Date of Initial Endocrinology Visit: 03/10/2021     PATIENT IDENTIFIER: [name]. is a 80 y.o. male with a past medical history of T2DM, HTN, psoriasis , Prostate Ca and CHF. The patient presented for initial endocrinology clinic visit on 03/10/2021 for consultative assistance with his diabetes management.    HPI: Scott Lawson was    Diagnosed with DM 2016 Prior Medications tried/Intolerance: *** Currently checking blood sugars *** x / day,  before breakfast and ***.  Hypoglycemia episodes : ***               Symptoms: ***                 Frequency: ***/  Hemoglobin A1c has ranged from 7.1% in 2017, peaking at >15.5% in 2022. Patient required assistance for hypoglycemia:  Patient has required hospitalization within the last 1 year from hyper or hypoglycemia: no, but had recent admission for PNA   In terms of diet, the patient ***   HOME DIABETES REGIMEN: Lantus 35 units  Humalog  Metformin 1000 mg BID     Statin: yes ACE-I/ARB: no Prior Diabetic Education: {Yes/No:11203}   METER DOWNLOAD SUMMARY: Date range evaluated: *** Fingerstick Blood Glucose Tests = *** Average Number Tests/Day = *** Overall Mean FS Glucose = *** Standard Deviation = ***  BG Ranges: Low = *** High = ***   Hypoglycemic Events/30 Days: BG < 50 = *** Episodes of symptomatic severe hypoglycemia = ***   DIABETIC COMPLICATIONS: Microvascular complications:  *** Denies: CKD Last eye exam: Completed   Macrovascular complications:  *** Denies: CAD, PVD, CVA   PAST HISTORY: Past Medical History:  Past Medical History:  Diagnosis Date   BPH (benign prostatic hyperplasia)    Diabetes mellitus without complication (HCC)    Elevated PSA    GERD (gastroesophageal reflux disease)     Hypertension    Hypertriglyceridemia    Nocturia    Past Surgical History:  Past Surgical History:  Procedure Laterality Date   APPENDECTOMY     CARPAL TUNNEL RELEASE Left 12/11/2015   Procedure: CARPAL TUNNEL RELEASE;  Surgeon: Earnestine Leys, MD;  Location: [facility] ORS;  Service: Orthopedics;  Laterality: Left;   EYE SURGERY     laser   TRANSURETHRAL RESECTION OF PROSTATE N/A 08/02/2017   Procedure: TRANSURETHRAL RESECTION OF THE PROSTATE (TURP);  Surgeon: Hollice Espy, MD;  Location: [facility] ORS;  Service: Urology;  Laterality: N/A;    Social History:  reports that he has never smoked. He has never used smokeless tobacco. He reports that he does not drink alcohol and does not use drugs. Family History:  Family History  Problem Relation Age of Onset   Cancer Mother        lung   Heart disease Father    Diabetes Brother    Prostate cancer Neg Hx    Bladder Cancer Neg Hx    Kidney cancer Neg Hx      HOME MEDICATIONS: Allergies as of 03/10/2021       Reactions   Gabapentin Other (See Comments)   "made me fall out"        Medication List        Accurate as of March 10, 2021  9:18 AM. If  you have any questions, ask your nurse or doctor.          albuterol 108 (90 Base) MCG/ACT inhaler Commonly known as: VENTOLIN HFA Inhale 2 puffs into the lungs every 6 (six) hours as needed for wheezing or shortness of breath.   aspirin EC 81 MG tablet Take 81 mg by mouth daily. Swallow whole.   atorvastatin 80 MG tablet Commonly known as: LIPITOR TAKE 1 TABLET EVERY DAY   fluticasone 50 MCG/ACT nasal spray Commonly known as: FLONASE Place 2 sprays into both nostrils daily as needed.   insulin glargine 100 UNIT/ML injection Commonly known as: LANTUS Inject 0.35 mLs (35 Units total) into the skin every evening. 1800   insulin lispro 100 UNIT/ML KwikPen Commonly known as: HumaLOG KwikPen Inject 15 Units into the skin 3 (three) times daily before meals. Continue to check  blood sugar after meals. What changed: when to take this   metFORMIN 500 MG tablet Commonly known as: GLUCOPHAGE Take 2 tablets (1,000 mg total) by mouth 2 (two) times daily with a meal. 1000mg  in the morning 1000mg  at night   midodrine 10 MG tablet Commonly known as: PROAMATINE Take 1 tablet (10 mg total) by mouth 3 (three) times daily with meals.   OMEGA 3 500 PO Take 500 mg by mouth daily.   omeprazole 20 MG capsule Commonly known as: PRILOSEC Take 1 capsule (20 mg total) by mouth daily.   predniSONE 20 MG tablet Commonly known as: DELTASONE Take 3 tablets once daily for 3 days followed by 2 tablets once daily for 3 days followed by 1 tablet once daily for 3 days and then stop   tamsulosin 0.4 MG Caps capsule Commonly known as: FLOMAX TAKE 1 CAPSULE EVERY DAY   True Metrix Blood Glucose Test test strip Generic drug: glucose blood CHECK BLOOD SUGAR TWICE DAILY   TRUEplus Lancets 33G Misc CHECK BLOOD SUGAR TWICE DAILY         ALLERGIES: Allergies  Allergen Reactions   Gabapentin Other (See Comments)    "made me fall out"     REVIEW OF SYSTEMS: A comprehensive ROS was conducted with the patient and is negative except as per HPI and below:  ROS    OBJECTIVE:   VITAL SIGNS: There were no vitals taken for this visit.   PHYSICAL EXAM:  General: Pt appears well and is in NAD  Hydration: Well-hydrated with moist mucous membranes and good skin turgor  HEENT: Head: Unremarkable with good dentition. Oropharynx clear without exudate.  Eyes: External eye exam normal without stare, lid lag or exophthalmos.  EOM intact.  PERRL.  Neck: General: Supple without adenopathy or carotid bruits. Thyroid: Thyroid size normal.  No goiter or nodules appreciated. No thyroid bruit.  Lungs: Clear with good BS bilat with no rales, rhonchi, or wheezes  Heart: RRR with normal S1 and S2 and no gallops; no murmurs; no rub  Abdomen: Normoactive bowel sounds, soft, nontender, without  masses or organomegaly palpable  Extremities:  Lower extremities - No pretibial edema. No lesions.  Skin: Normal texture and temperature to palpation. No rash noted. No Acanthosis nigricans/skin tags. No lipohypertrophy.  Neuro: MS is good with appropriate affect, pt is alert and Ox3    DM foot exam:    DATA REVIEWED:  Lab Results  Component Value Date   HGBA1C >15.5 (H) 03/02/2021   HGBA1C >14.0 (H) 02/04/2021   HGBA1C 13.9 (H) 11/26/2020   Lab Results  Component Value Date   MICROALBUR  10 11/26/2020   LDLCALC 78 02/04/2021   CREATININE 1.06 03/04/2021   Lab Results  Component Value Date   MICRALBCREAT 30-300 (H) 11/26/2020    Lab Results  Component Value Date   CHOL 173 02/04/2021   HDL 33 (L) 02/04/2021   LDLCALC 78 02/04/2021   TRIG 388 (H) 02/04/2021        ASSESSMENT / PLAN / RECOMMENDATIONS:   1) Type 2 Diabetes Mellitus, ***controlled, With*** complications - Most recent A1c of >15.5% %. Goal A1c < 8.0 %.    Plan: GENERAL: ***  MEDICATIONS: ***  EDUCATION / INSTRUCTIONS: BG monitoring instructions: Patient is instructed to check his blood sugars *** times a day, ***. Call [facility] if: BG persistently < 70  I reviewed the Rule of 15 for the treatment of hypoglycemia in detail with the patient. Literature supplied.   2) Diabetic complications:  Eye: Does *** have known diabetic retinopathy.  Neuro/ Feet: Does *** have known diabetic peripheral neuropathy. Renal: Patient does *** have known baseline CKD. He is *** on an ACEI/ARB at present.  3) Lipids: Patient is *** on a statin.         Signed electronically by: Mack Guise, MD  [facility] Endocrinology  [facility] [address] [place], [place] [address] Phone: [phone] FAX: [phone]   CC: Venita Lick, NP [place] [address] Phone: [phone]  Fax: [phone]    Return to Endocrinology clinic  as below: Future Appointments  Date Time Provider [place]  03/10/2021  1:40 PM Couper Juncaj, Melanie Crazier, MD [facility] None  03/11/2021 12:30 PM Jason Coop, NP [facility] None  03/13/2021  1:00 PM Charyl Dancer, NP [facility]  03/18/2021  2:45 PM [facility]FP CCM CASE MANAGER [facility]

## 2021-03-11 ENCOUNTER — Other Ambulatory Visit: Payer: Self-pay

## 2021-03-11 ENCOUNTER — Other Ambulatory Visit: Payer: Medicare Other | Admitting: Primary Care

## 2021-03-11 ENCOUNTER — Ambulatory Visit (INDEPENDENT_AMBULATORY_CARE_PROVIDER_SITE_OTHER): Payer: Medicare Other

## 2021-03-11 ENCOUNTER — Telehealth: Payer: Self-pay

## 2021-03-11 DIAGNOSIS — Z515 Encounter for palliative care: Secondary | ICD-10-CM

## 2021-03-11 DIAGNOSIS — IMO0002 Reserved for concepts with insufficient information to code with codable children: Secondary | ICD-10-CM

## 2021-03-11 DIAGNOSIS — E1122 Type 2 diabetes mellitus with diabetic chronic kidney disease: Secondary | ICD-10-CM | POA: Diagnosis not present

## 2021-03-11 DIAGNOSIS — R634 Abnormal weight loss: Secondary | ICD-10-CM

## 2021-03-11 DIAGNOSIS — I5032 Chronic diastolic (congestive) heart failure: Secondary | ICD-10-CM | POA: Diagnosis not present

## 2021-03-11 DIAGNOSIS — Z794 Long term (current) use of insulin: Secondary | ICD-10-CM | POA: Diagnosis not present

## 2021-03-11 DIAGNOSIS — W19XXXS Unspecified fall, sequela: Secondary | ICD-10-CM | POA: Diagnosis not present

## 2021-03-11 DIAGNOSIS — I10 Essential (primary) hypertension: Secondary | ICD-10-CM

## 2021-03-11 DIAGNOSIS — J432 Centrilobular emphysema: Secondary | ICD-10-CM

## 2021-03-11 DIAGNOSIS — E785 Hyperlipidemia, unspecified: Secondary | ICD-10-CM

## 2021-03-11 DIAGNOSIS — E1169 Type 2 diabetes mellitus with other specified complication: Secondary | ICD-10-CM

## 2021-03-11 DIAGNOSIS — Z9181 History of falling: Secondary | ICD-10-CM

## 2021-03-11 NOTE — Telephone Encounter (Signed)
Libby from [facility] concerned about patient as they have been scheduling patient for appointments with the NP, but they cannot never get a hold of him. [facility] said when they reach out to the relative patient is currently living with does not never wants the Palliative Care or NP to see the patient. They will never confirm the address at to where patient is located to have him be seen. Golden Circle says she spoke with another granddaughter by the name of [name] and states she is concerned they have the patient at their home and is collecting his monthly checks and not taking care of him. She wanted to call and let his PCP know what is going on because they do not know what to do or where to start so the patient can be seen. Golden Circle states that Humphrey informed her that patient residence in [place] does not have lights nor water. Please advise?

## 2021-03-11 NOTE — Patient Instructions (Signed)
Visit Information   PATIENT GOALS:   Goals Addressed             This Visit's Progress    RNCM: Monitor and Manage My Blood Sugar-Diabetes Type 2       Timeframe:  Long-Range Goal Priority:  High Start Date:    03-11-2021                         Expected End Date:   03-11-2022                    Follow Up Date 03/18/2021    - check blood sugar at prescribed times - check blood sugar before and after exercise - check blood sugar if I feel it is too high or too low - enter blood sugar readings and medication or insulin into daily log - take the blood sugar log to all doctor visits - take the blood sugar meter to all doctor visits    Why is this important?   Checking your blood sugar at home helps to keep it from getting very high or very low.  Writing the results in a diary or log helps the doctor know how to care for you.  Your blood sugar log should have the time, date and the results.  Also, write down the amount of insulin or other medicine that you take.  Other information, like what you ate, exercise done and how you were feeling, will also be helpful.    Lab Results  Component Value Date   HGBA1C >15.5 (H) 03/02/2021      03-11-2021: No home readings obtained today. The patient with recent hospitalization and readings >300. Last recorded reading was 314 on 03-04-2021. Will work with the patient and patients family on DM education and support.      RNCM: Prevent Falls and Injury       Follow Up Date 03/18/2021    - add more outdoor lighting - always use handrails on the stairs - always wear low-heeled or flat shoes or slippers with nonskid soles - call the doctor if I am feeling too drowsy - install bathroom grab bars - join an exercise group in my community - keep a flashlight by the bed - keep my cell phone with me always - learn how to get back up if I fall - make an emergency alert plan in case I fall - pick up clutter from the floors - use a nonslip pad with  throw rugs, or remove them completely - use a cane or walker - use a nightlight in the bathroom - wear my glasses and/or hearing aid    Why is this important?   Most falls happen when it is hard for you to walk safely. Your balance may be off because of an illness. You may have pain in your knees, hip or other joints.  You may be overly tired or taking medicines that make you sleepy. You may not be able to see or hear clearly.  Falls can lead to broken bones, bruises or other injuries.  There are things you can do to help prevent falling.     03-11-2021: The patient is currently living with his granddaughter [name] Musgrave. She states she needs help in the home. Patient with recent hospitalization. Per granddaughter palliative care is to follow up in the home today and post hospital appointment is 03-13-2021. Reminded the granddaughter to bring discharge instructions with  her to the appointment and it was imperative to keep appointment with palliative care services today.      RNCM: Set My Target A1C-Diabetes Type 2       Timeframe:  Long-Range Goal Priority:  High Start Date:    03-11-2021                         Expected End Date:          06-13-2021             Follow Up Date 03/18/2021    - set target A1C    Why is this important?   Your target A1C is decided together by you and your doctor.  It is based on several things like your age and other health issues.     Lab Results  Component Value Date   HGBA1C >15.5 (H) 03/02/2021    03-11-2021: Evidence of non-compliance with medications and treatment plan for DM as evidence of recent hospitalization. The patient was out of insulin x 4 days per the notes. Spoke with the patients granddaughter today and she states the patient is compliant with medications that she and her daughter assist with his medications. Has follow ups with the pcp on 03-13-2021 and [facility] today. Expressed that it was imperative to keep  appointments so the patient could get the needed help for expressed needs.      RNCM: Track and Manage My Blood Pressure-Hypertension       Timeframe:  Long-Range Goal Priority:  High Start Date:            03-11-2021                 Expected End Date:       03-11-2022                Follow Up Date 03/18/2021    - check blood pressure 3 times per week - check blood pressure daily - check blood pressure weekly - choose a place to take my blood pressure (home, clinic or office, retail store) - write blood pressure results in a log or diary    Why is this important?   You won't feel high blood pressure, but it can still hurt your blood vessels.  High blood pressure can cause heart or kidney problems. It can also cause a stroke.  Making lifestyle changes like losing a little weight or eating less salt will help.  Checking your blood pressure at home and at different times of the day can help to control blood pressure.  If the doctor prescribes medicine remember to take it the way the doctor ordered.  Call the office if you cannot afford the medicine or if there are questions about it.     BP Readings from Last 3 Encounters:  03/04/21 121/82  02/05/21 115/67  02/04/21 (!) 96/56    03-11-2021: The patient has good control of HTN at this time. Will continue to monitor for changes and needs. Patient has had decline in health since being COVID positive. Working with the patient and the family to help with effective management of chronic conditions.      RNCML: Manage My Medicine       Timeframe:  Long-Range Goal Priority:  High Start Date:     03-11-2021  Expected End Date:       03-11-2022                Follow Up Date 03/18/2021    - call for medicine refill 2 or 3 days before it runs out - call if I am sick and can't take my medicine - keep a list of all the medicines I take; vitamins and herbals too - learn to read medicine labels - use a pillbox to sort  medicine - use an alarm clock or phone to remind me to take my medicine    Why is this important?   These steps will help you keep on track with your medicines.   03-11-2021: Spoke with [name] the patients granddaughter. She and her daughter are helping the patient with managing his medications. She states that the patient is now compliant with medications. Encouraged [name] to bring discharge paperwork with her to the patient appointment on 03-13-2021. Has a palliative appointment scheduled for today, expressed to [name] it was imperative that the NP come today to see patient to address concerns and needs of the patient.          CLINICAL CARE PLAN: Patient Care Plan: RNCM: General Plan of Care (Adult) for Chronic Disease Management and Care Coordination Needs     Problem Identified: RNCM: Adult plan of care for Chronic Disease Management and Care Coordination Needs   Priority: High  Onset Date: 03/11/2021     Long-Range Goal: RNCM: Adult plan of care for Chronic Disease Management and Care Coordination Needs   Start Date: 03/11/2021  Expected End Date: 03/11/2022  This Visit's Progress: On track  Priority: High  Note:   Current Barriers:  Knowledge Deficits related to plan of care for management of HTN, HLD, DMII, Pulmonary Disease, and falls prevention and safety Care Coordination needs related to Limited social support, Level of care concerns, ADL IADL limitations, Family and relationship dysfunction, Social Isolation, Inability to perform IADL's independently, and non-compliance   Chronic Disease Management support and education needs related to HTN, HLD, DMII,  Pulmonary Disease and falls prevention and safety  Lacks caregiver support.  Non-adherence to scheduled provider appointments Non-adherence to prescribed medication regimen  RNCM Clinical Goal(s):  Patient will verbalize understanding of plan for management of HTN, HLD, DMII, Pulmonary Disease, and Falls prevention  and safety   verbalize basic understanding of HTN, HLD, DMII, Pulmonary Disease, and falls prevention and safety  disease process and self health management plan   take all medications exactly as prescribed and will call provider for medication related questions demonstrate understanding of rationale for each prescribed medication 03-11-2021: Granddaughter [name] states that she and her daughter are assisting the patient with his medications at this time.  attend all scheduled medical appointments: 03-13-2021, reminded the patients granddaughter [name] of this appointment today, [facility] has an appointment in the home today  demonstrate improved and ongoing adherence to prescribed treatment plan for HTN, HLD, DMII, Pulmonary Disease, and falls risk and safety  as evidenced by daily monitoring and recording of CBG  adherence to ADA/ carb modified diet adherence to prescribed medication regimen contacting provider for new or worsened symptoms or questions compliance with the plan of care for effective management of chronic conditions    demonstrate improved and ongoing health management independence for effective management of Chronic diseases  continue to work with RN Care Manager to address care management and care coordination needs related to HTN, HLD, DMII, Pulmonary Disease, and  falls prevention and safety   work with Education officer, museum to address Limited social support, Level of care concerns, ADL IADL limitations, Family and relationship dysfunction, Social Isolation, and noncompliance  related to the management of HTN, HLD, DMII, Pulmonary Disease, and falls prevention and safety  demonstrate a decrease in HTN, HLD, DMII, Pulmonary Disease, and falls prevention and safety  exacerbations   demonstrate ongoing self health care management ability and effective management of chronic condtions   through collaboration with Consulting civil engineer, provider, and care team.    Interventions: 1:1 collaboration with primary care provider regarding development and update of comprehensive plan of care as evidenced by provider attestation and co-signature Inter-disciplinary care team collaboration (see longitudinal plan of care) Evaluation of current treatment plan related to  self management and patient's adherence to plan as established by provider   SDOH Barriers (Status: New goal.)  Patient interviewed and SDOH assessment performed        SDOH Interventions    Flowsheet Row Most Recent Value  SDOH Interventions   Food Insecurity Interventions Intervention Not Indicated  Transportation Interventions Intervention Not Indicated     Patient interviewed and appropriate assessments performed Provided patient with information about taking discharge paperwork with the patient to upcoming appointment with the pcp on 03-13-2021. In home visit today with the palliative care services Discussed plans with patient for ongoing care management follow up and provided patient with direct contact information for care management team Advised patient to call the office for changes in condition, new questions, or concerns Collaborated with primary care provider re: related to patients health and well being and talking with the patient granddaughter today. The patient is currently living with the granddaughter who states she needs help in the home to help with the patients needs.  Collaborated with [facility]  (community agency) re: concerns and needs of the patient getting the care he needs in his current living environment and recent hospitalization for uncontrolled blood sugars and hemoglobin A1C of >15.5 Provided education to patient/caregiver regarding level of care options.    Emphysema  (Status: New goal.) Reviewed medications with patient, including use of prescribed maintenance and rescue inhalers, and provided instruction on medication management and  the importance of adherence Provided patient with basic written and verbal emphysema education on self care/management/and exacerbation prevention; Advised patient to track and manage emphysema triggers;  Provided instruction about proper use of medications used for management of emphysema  including inhalers; Advised patient to self assesses emphysema action plan zone and make appointment with provider if in the yellow zone for 48 hours without improvement; Advised patient to engage in light exercise as tolerated 3-5 days a week to aid in the the management of emphysema  Provided education about and advised patient to utilize infection prevention strategies to reduce risk of respiratory infection; Discussed the importance of adequate rest and management of fatigue with emphysema   Diabetes:  (Status: Goal on track: NO.) Lab Results  Component Value Date   HGBA1C >15.5 (H) 03/02/2021  Assessed patient's understanding of A1c goal: <7% Provided education to patient about basic DM disease process; Reviewed medications with patient and discussed importance of medication adherence;        Reviewed prescribed diet with patient Heart Healthy/ADA diet; Counseled on importance of regular laboratory monitoring as prescribed;        Discussed plans with patient for ongoing care management follow up and provided patient with direct contact information for care management team;  Provided patient with written educational materials related to hypo and hyperglycemia and importance of correct treatment;       Reviewed scheduled/upcoming provider appointments including: 03-13-2021- reminded the granddaughter [name] of appointment today ;         Advised patient, providing education and rationale, to check cbg as directed  and record        call provider for findings outside established parameters;       Referral made to pharmacy team for assistance with help with DM education, medication reconciliation and  support and education for medications ;       Referral made to social work team for assistance with family dynamics and level of care concerns;      Review of patient status, including review of consultants reports, relevant laboratory and other test results, and medications completed;       Advised patient to discuss effective management of DM and other chronic conditions with provider;      Screening for signs and symptoms of depression related to chronic disease state;        Assessed social determinant of health barriers;         Falls:  (Status: New goal.) Provided written and verbal education re: potential causes of falls and Fall prevention strategies Reviewed medications and discussed potential side effects of medications such as dizziness and frequent urination Advised patient of importance of notifying provider of falls Assessed for signs and symptoms of orthostatic hypotension Assessed for falls since last encounter Assessed patients knowledge of fall risk prevention secondary to previously provided education Provided patient information for fall alert systems Advised patient to discuss safety concerns with provider  Hyperlipidemia:  (Status: Goal on track: YES.) Lab Results  Component Value Date   CHOL 173 02/04/2021   HDL 33 (L) 02/04/2021   LDLCALC 78 02/04/2021   TRIG 388 (H) 02/04/2021     Medication review performed; medication list updated in electronic medical record.  Provider established cholesterol goals reviewed; Counseled on importance of regular laboratory monitoring as prescribed; Provided HLD educational materials; Reviewed role and benefits of statin for ASCVD risk reduction; Discussed strategies to manage statin-induced myalgias; Reviewed importance of limiting foods high in cholesterol; Reviewed exercise goals and target of 150 minutes per week;  Hypertension: (Status: Goal on track: YES.) Last practice recorded BP readings:  BP Readings from Last 3  Encounters:  03/04/21 121/82  02/05/21 115/67  02/04/21 (!) 96/56  Most recent eGFR/CrCl:  Lab Results  Component Value Date   EGFR 47 (L) 02/04/2021    No components found for: CRCL  Evaluation of current treatment plan related to hypertension self management and patient's adherence to plan as established by provider;   Provided education to patient re: stroke prevention, s/s of heart attack and stroke; Reviewed prescribed diet heart healthy/ADA  Reviewed medications with patient and discussed importance of compliance;  Discussed plans with patient for ongoing care management follow up and provided patient with direct contact information for care management team; Advised patient, providing education and rationale, to monitor blood pressure daily and record, calling PCP for findings outside established parameters;  Reviewed scheduled/upcoming provider appointments including:  Provided education on prescribed diet heart healthy/ADA diet;  Discussed complications of poorly controlled blood pressure such as heart disease, stroke, circulatory complications, vision complications, kidney impairment, sexual dysfunction;   Patient Goals/Self-Care Activities: Patient will self administer medications as prescribed as evidenced by self report/primary caregiver report  Patient will attend all scheduled provider appointments  as evidenced by clinician review of documented attendance to scheduled appointments and patient/caregiver report Patient will call pharmacy for medication refills as evidenced by patient report and review of pharmacy fill history as appropriate Patient will attend church or other social activities as evidenced by patient report Patient will continue to perform ADL's independently as evidenced by patient/caregiver report Patient will continue to perform IADL's independently as evidenced by patient/caregiver report Patient will call provider office for new concerns or questions as  evidenced by review of documented incoming telephone call notes and patient report Patient will work with BSW to address care coordination needs and will continue to work with the clinical team to address health care and disease management related needs as evidenced by documented adherence to scheduled care management/care coordination appointments       Consent to CCM Services: Mr. Losito was given information about Chronic Care Management services including:  CCM service includes personalized support from designated clinical staff supervised by his physician, including individualized plan of care and coordination with other care providers 24/7 contact phone numbers for assistance for urgent and routine care needs. Service will only be billed when office clinical staff spend 20 minutes or more in a month to coordinate care. Only one practitioner may furnish and bill the service in a calendar month. The patient may stop CCM services at any time (effective at the end of the month) by phone call to the office staff. The patient will be responsible for cost sharing (co-pay) of up to 20% of the service fee (after annual deductible is met).  Patient agreed to services and verbal consent obtained.   The patient verbalized understanding of instructions, educational materials, and care plan provided today and declined offer to receive copy of patient instructions, educational materials, and care plan.   Telephone follow up appointment with care management team member scheduled for: 03-18-2021   Noreene Larsson RN, MSN, [facility] Mobile: [phone]

## 2021-03-11 NOTE — Progress Notes (Signed)
[facility] Palliative Care Consult Note Telephone: [phone]  Fax: [phone]   Date of encounter: 03/11/21 12:42 PM PATIENT NAME: Scott Lawson. [address] [place] [address]   [phone] (home)  DOB: 1941/02/18 MRN: [id number] PRIMARY CARE PROVIDER:    Venita Lick, NP,  [place] [address] [phone]  REFERRING PROVIDER:   Venita Lick, NP [address] [place],  [place] [address] [phone]  RESPONSIBLE PARTY:    Contact Information     Name Relation Home Work [place] Daughter [phone]  [phone]   Scott Lawson Granddaughter   [phone]   Scott Lawson Daughter [phone]  [phone]   Scott Lawson Granddaughter   [phone]        I met face to face with patient and family in  home. Palliative Care was asked to follow this patient by consultation request of  Scott Lick, NP to address advance care planning and complex medical decision making. This is the initial visit.                                     ASSESSMENT AND PLAN / RECOMMENDATIONS:   Advance Care Planning/Goals of Care: Goals include to maximize quality of life and symptom management. Patient/health care surrogate gave his/her permission to discuss.Our advance care planning conversation included a discussion about:     Exploration of personal, cultural or spiritual beliefs that might influence medical decisions  Exploration of goals of care in the event of a sudden injury or illness  Identification of a healthcare agent - no one official. Lives with granddaughter and her family. Has 2 daughters in the area, indeterminate involvement. Will have SW follow. Patient does choose his granddaughter to be POA. I discussed f/u with achieving this, which may include pursuing guardianship. Discussed care options with family RE dementia care and what care would be needed as disease  advanced.  CODE STATUS: full I spent 20 minutes providing this consultation. More than 50% of the time in this consultation was spent in counseling and care coordination. ____________________________________________________________________  Symptom Management/Plan:  Mobility: Per pt and pt family, pt is unstable on his feet. Family state that at this time, a cane is not sufficient for his mobility.  Possibility of getting a walker was discussed and both pt and pt family agree that this is a better option. The possibility of also having some home health pt brought in to help pt with strengthen his muscles was discussed as a possibility should he have medical coverage. I have asked PCP to order PT, OT, HHA and RN for med management. They also inquired about mobile w/c but he may not have cognition for this.  Falls: Per family, pt fell x1 this week in bathroom. Pt and pt family was educated on limiting throw rugs, de-cluttering pt room, and leaving a clear pathway from pt room to the kitchen, and proper lighting to help mitigate falls risks. Per family,  grandfather uses a vertical pole in the bolted in living room to hold on to before going to the kitchen.  Medication Compliance: Upon medication reconciliation, it was noted that pt was missing a few of his medication including insulin Lispro. He  recently changed insurances from [place] to [place] and were not sure who and where to get pt prescriptions.  Pt is a English as a second language teacher. And gets services  currently at the [facility].   A number for the [facility] was obtain and provided for the pt family to call and request this Lispro and other meds. He would like all meds thru [place] due to finances. [name] will f/u with needed appts so he can be a current patient.   Blood Sugar: Based on pt's medical record 2 weeks ago, his A1c was 15.5%  with elevated blood sugar levels randomly checked. Insulin and diabetic medication education was done. Pt has not been checking his blood sugar  levels or taking his meal time insulin. Pt family was educated on how to take pt blood sugar, but the supplies were lost or missing. New blood glucose equipment was requested by PCP and hopefully they can pick up on their visit on  Thursday. Pt family was instructed on how to get his insulin from [place].   Patient needs urgent f/u with obtaining insulin lispro and how to administer.He is using lantus 35 units at hs, but has lispro 15 units  tid prescribed and none in home.  Blood Pressure: Pt blood pressure were low. It was noted that pt has been prescribed midodrine but none in home. Pt and family educated on taking all his medication as ordered; and education on how the medication works and why it is important was discussed.   Elopement Risk: Pt kept saying he was going to run away, in response to overwhelm during our interview.  Per pt family, she does not believe that he will but given his dementia, Pt family was advised to always have someone around watching him, and to call and alert the local police in the area so that they are aware of this possibility.   Advanced Care Planning and Collaboration of Care: Advanced care planning and goals of care were discussed.  Pt was educated on selecting a power of attorney or guardian as well as discussed what he wanted for his care.  This is a discussion that has to be had in the near future.  At this time, plan of care is to figure out how to get him his medication, fix the insurance issues as well as establish and obtain his VA benefits.  A follow-up date has been scheduled.   Follow up Palliative Care Visit: Palliative care will continue to follow for complex medical decision making, advance care planning, and clarification of goals. Return 3 weeks or prn.  This visit was coded based on medical decision making (MDM).  PPS: 40%  HOSPICE ELIGIBILITY/DIAGNOSIS: TBD  Chief Complaint: dementia, glucose management  HISTORY OF PRESENT ILLNESS:   Scott Lawson. is a 80 y.o. year old male  with recent exacerbation of cognitive decline and poor glucose management. Pt has 15.5% A1c, and has not been taking his insulin Lispro about two weeks form his last hospital admission. Pt does not have this insulin lispro due to cost and his family was given instructions on how to check his blood sugar as well as how to get his insulin from the [place]. Due to his cognitive decline, nursing home options were discussed and at this time, family has opted to stay and take care of him.   History obtained from review of EMR, discussion with primary team, and interview with family, facility staff/caregiver and/or Scott Lawson.  I reviewed available labs, medications, imaging, studies and related documents from the EMR.  Records reviewed and summarized above.   ROS  General: NAD EYES: denies vision changes ENMT: denies dysphagia  Cardiovascular: denies chest pain, endorses  DOE Pulmonary: denies cough, endorse increased SOB Abdomen: endorses good appetite, endorses loose stools, endorses continence of bowel GU: denies dysuria, endorses continence of urine MSK:  denies weakness,  +  falls reported in bathroom, Uses cane Skin: denies rashes or wounds Neurological: denies pain, denies insomnia Psych: Endorses positive mood Heme/lymph/immuno: denies bruises, abnormal bleeding  Physical Exam: Current and past weights: 170 lbs now, was 207 lbs in 1/22, 18% weight loss in 10 months. Constitutional: NAD, 85/40, 101/45  HR 85 RR 18 General: frail appearing EYES: anicteric sclera, lids intact, no discharge  ENMT: intact hearing, oral mucous membranes moist CV: S1S2, RRR, no LE edema Pulmonary: no increased work of breathing, no cough, room air, 92% Abdomen: intake 100%, no ascites GU: deferred MSK: ++ sarcopenia, moves all extremities, ambulatory, frequent fall risk Skin: warm and dry, no rashes or wounds on visible skin Neuro:  generalized weakness,  severe   cognitive impairment Psych: anxious affect, A and O x 1-2 Hem/lymph/immuno: no widespread bruising CURRENT PROBLEM LIST:  Patient Active Problem List   Diagnosis Date Noted   Shock ([place]) 03/02/2021   Fall 18/29/9371   Acute metabolic encephalopathy 69/67/8938   Hyperosmolar hyperglycemic state (HHS) ([place]) 02/28/2021   Syncope and collapse 02/05/2021   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[place]) 02/04/2021   Weight loss, unintentional 02/04/2021   Uncontrolled type 2 diabetes mellitus with hyperosmolar nonketotic hyperglycemia ([place]) 02/04/2021   Essential hypertension 02/04/2021   Chronic diastolic CHF (congestive heart failure) ([place]) 02/04/2021   Syncope 02/04/2021   Emphysema lung ([place]) 10/26/2020   History of 2019 novel coronavirus disease (COVID-19) 10/26/2020   Interstitial lung disease ([place]) 08/24/2020   Chronic respiratory failure with hypoxia ([place]) 06/20/2020   Atherosclerosis of aorta ([place]) 10/12/2019   Long-term insulin use ([place]) 10/12/2019   Vitamin B12 deficiency 06/28/2018   Prostate cancer ([place]) 06/27/2018   Psoriasis 08/16/2017   Advanced care planning/counseling discussion 12/01/2016   Acute kidney injury superimposed on chronic kidney disease ([place]) 08/14/2015   GERD without esophagitis 08/14/2015   Uncontrolled type 2 diabetes mellitus with chronic kidney disease ([place]) 08/13/2015   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[place]) 08/13/2015   Chronic kidney disease, stage 3a ([place]) 02/11/2015   Mixed hyperlipidemia due to type 2 diabetes mellitus ([place]) 02/11/2015   BPH (benign prostatic hyperplasia) 02/11/2015   PAST MEDICAL HISTORY:  Active Ambulatory Problems    Diagnosis Date Noted   Chronic kidney disease, stage 3a ([place]) 02/11/2015   Mixed hyperlipidemia due to type 2 diabetes mellitus ([place]) 02/11/2015   BPH (benign prostatic hyperplasia) 02/11/2015   Uncontrolled type 2 diabetes mellitus with chronic kidney disease ([place]) 08/13/2015   Hypertensive  heart/kidney disease without HF and with CKD stage III ([place]) 08/13/2015   Acute kidney injury superimposed on chronic kidney disease ([place]) 08/14/2015   GERD without esophagitis 08/14/2015   Advanced care planning/counseling discussion 12/01/2016   Psoriasis 08/16/2017   Prostate cancer ([place]) 06/27/2018   Vitamin B12 deficiency 06/28/2018   Atherosclerosis of aorta ([place]) 10/12/2019   Long-term insulin use ([place]) 10/12/2019   Chronic respiratory failure with hypoxia ([place]) 06/20/2020   Interstitial lung disease ([place]) 08/24/2020   Emphysema lung ([place]) 10/26/2020   History of 2019 novel coronavirus disease (COVID-19) 10/26/2020   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[place]) 02/04/2021   Weight loss, unintentional 02/04/2021   Uncontrolled type 2 diabetes mellitus with hyperosmolar nonketotic hyperglycemia ([place]) 02/04/2021   Essential hypertension 02/04/2021   Chronic diastolic  CHF (congestive heart failure) ([place]) 02/04/2021   Syncope 02/04/2021   Syncope and collapse 02/05/2021   Hyperosmolar hyperglycemic state (HHS) ([place]) 02/28/2021   Shock ([place]) 03/02/2021   Fall 96/78/9381   Acute metabolic encephalopathy 01/75/1025   Resolved Ambulatory Problems    Diagnosis Date Noted   Type 2 diabetes mellitus with chronic kidney disease ([place]) 02/11/2015   Hypertensive CKD (chronic kidney disease) 02/11/2015   Dehydration 08/14/2015   Flu syndrome 08/14/2015   Elevated troponin 08/14/2015   Nocturia 06/02/2016   BPH with urinary obstruction 08/02/2017   Bradycardia 04/20/2019   Dark stools 07/13/2019   Morbid obesity ([place]) 03/22/2020   Hyperglycemia due to type 2 diabetes mellitus ([place]) 06/20/2020   Generalized weakness 06/20/2020   Pneumonia due to COVID-19 virus 06/20/2020   Obesity (BMI 30-39.9) 06/20/2020   Skin ulcer of forearm with fat layer exposed ([place]) 08/26/2020   ANA positive 10/26/2020   Past Medical History:  Diagnosis Date   Diabetes mellitus without complication (HCC)    Elevated PSA     GERD (gastroesophageal reflux disease)    Hypertension    Hypertriglyceridemia    SOCIAL HX:  Social History   Tobacco Use   Smoking status: Never   Smokeless tobacco: Never  Substance Use Topics   Alcohol use: No    Alcohol/week: 0.0 standard drinks   FAMILY HX:  Family History  Problem Relation Age of Onset   Cancer Mother        lung   Heart disease Father    Diabetes Brother    Prostate cancer Neg Hx    Bladder Cancer Neg Hx    Kidney cancer Neg Hx       ALLERGIES:  Allergies  Allergen Reactions   Gabapentin Other (See Comments)    "made me fall out"     PERTINENT MEDICATIONS:  Outpatient Encounter Medications as of 03/11/2021  Medication Sig   albuterol (VENTOLIN HFA) 108 (90 Base) MCG/ACT inhaler Inhale 2 puffs into the lungs every 6 (six) hours as needed for wheezing or shortness of breath.   aspirin EC 81 MG tablet Take 81 mg by mouth daily. Swallow whole.   atorvastatin (LIPITOR) 80 MG tablet TAKE 1 TABLET EVERY DAY   insulin glargine (LANTUS) 100 UNIT/ML injection Inject 0.35 mLs (35 Units total) into the skin every evening. 1800   metFORMIN (GLUCOPHAGE) 500 MG tablet Take 2 tablets (1,000 mg total) by mouth 2 (two) times daily with a meal. 1076m in the morning 10053mat night   omeprazole (PRILOSEC) 20 MG capsule Take 1 capsule (20 mg total) by mouth daily.   tamsulosin (FLOMAX) 0.4 MG CAPS capsule TAKE 1 CAPSULE EVERY DAY   TRUE METRIX BLOOD GLUCOSE TEST test strip CHECK BLOOD SUGAR TWICE DAILY   TRUEplus Lancets 33G MISC CHECK BLOOD SUGAR TWICE DAILY   fluticasone (FLONASE) 50 MCG/ACT nasal spray Place 2 sprays into both nostrils daily as needed.    insulin lispro (HUMALOG KWIKPEN) 100 UNIT/ML KwikPen Inject 15 Units into the skin 3 (three) times daily before meals. Continue to check blood sugar after meals. (Patient not taking: Reported on 03/11/2021)   midodrine (PROAMATINE) 10 MG tablet Take 1 tablet (10 mg total) by mouth 3 (three) times daily with meals.  (Patient not taking: Reported on 03/11/2021)   Omega-3 Fatty Acids (OMEGA 3 500 PO) Take 500 mg by mouth daily. (Patient not taking: Reported on 03/11/2021)   predniSONE (DELTASONE) 20 MG tablet Take 3 tablets once daily for  3 days followed by 2 tablets once daily for 3 days followed by 1 tablet once daily for 3 days and then stop (Patient not taking: Reported on 03/11/2021)   No facility-administered encounter medications on file as of 03/11/2021.   Thank you for the opportunity to participate in the care of Scott Lawson.  The palliative care team will continue to follow. Please call our office at [phone] if we can be of additional assistance.   Jason Coop, NP , DNP, AGPCNP-BC  COVID-19 PATIENT SCREENING TOOL Asked and negative response unless otherwise noted:  Have you had symptoms of covid, tested positive or been in contact with someone with symptoms/positive test in the past 5-10 days?

## 2021-03-11 NOTE — Chronic Care Management (AMB) (Signed)
Chronic Care Management   CCM RN Visit Note  03/11/2021 Name: Yuuki Skeens. MRN: [id number] DOB: [birth year]  Subjective: Princess Bruins Kunka [name] is a 80 y.o. year old male who is a primary care patient of Cannady, Barbaraann Faster, NP. The care management team was consulted for assistance with disease management and care coordination needs.    Engaged with patient by telephone for initial visit in response to provider referral for case management and/or care coordination services.   Consent to Services:  The patient was given the following information about Chronic Care Management services today, agreed to services, and gave verbal consent: 1. CCM service includes personalized support from designated clinical staff supervised by the primary care provider, including individualized plan of care and coordination with other care providers 2. 24/7 contact phone numbers for assistance for urgent and routine care needs. 3. Service will only be billed when office clinical staff spend 20 minutes or more in a month to coordinate care. 4. Only one practitioner may furnish and bill the service in a calendar month. 5.The patient may stop CCM services at any time (effective at the end of the month) by phone call to the office staff. 6. The patient will be responsible for cost sharing (co-pay) of up to 20% of the service fee (after annual deductible is met). Patient agreed to services and consent obtained.  Patient agreed to services and verbal consent obtained.   Assessment: Review of patient past medical history, allergies, medications, health status, including review of consultants reports, laboratory and other test data, was performed as part of comprehensive evaluation and provision of chronic care management services.   SDOH (Social Determinants of Health) assessments and interventions performed:  SDOH Interventions    Flowsheet Row Most Recent Value  SDOH Interventions   Food Insecurity Interventions  Intervention Not Indicated  Transportation Interventions Intervention Not Indicated        CCM Care Plan  Allergies  Allergen Reactions   Gabapentin Other (See Comments)    "made me fall out"    Outpatient Encounter Medications as of 03/11/2021  Medication Sig   albuterol (VENTOLIN HFA) 108 (90 Base) MCG/ACT inhaler Inhale 2 puffs into the lungs every 6 (six) hours as needed for wheezing or shortness of breath.   aspirin EC 81 MG tablet Take 81 mg by mouth daily. Swallow whole.   atorvastatin (LIPITOR) 80 MG tablet TAKE 1 TABLET EVERY DAY   fluticasone (FLONASE) 50 MCG/ACT nasal spray Place 2 sprays into both nostrils daily as needed.    insulin glargine (LANTUS) 100 UNIT/ML injection Inject 0.35 mLs (35 Units total) into the skin every evening. 1800   insulin lispro (HUMALOG KWIKPEN) 100 UNIT/ML KwikPen Inject 15 Units into the skin 3 (three) times daily before meals. Continue to check blood sugar after meals. (Patient taking differently: Inject 15 Units into the skin in the morning and at bedtime. Continue to check blood sugar after meals.)   metFORMIN (GLUCOPHAGE) 500 MG tablet Take 2 tablets (1,000 mg total) by mouth 2 (two) times daily with a meal. 1078m in the morning 10071mat night   midodrine (PROAMATINE) 10 MG tablet Take 1 tablet (10 mg total) by mouth 3 (three) times daily with meals.   Omega-3 Fatty Acids (OMEGA 3 500 PO) Take 500 mg by mouth daily.   omeprazole (PRILOSEC) 20 MG capsule Take 1 capsule (20 mg total) by mouth daily.   predniSONE (DELTASONE) 20 MG tablet Take 3 tablets once daily for 3  days followed by 2 tablets once daily for 3 days followed by 1 tablet once daily for 3 days and then stop   tamsulosin (FLOMAX) 0.4 MG CAPS capsule TAKE 1 CAPSULE EVERY DAY   TRUE METRIX BLOOD GLUCOSE TEST test strip CHECK BLOOD SUGAR TWICE DAILY   TRUEplus Lancets 33G MISC CHECK BLOOD SUGAR TWICE DAILY   No facility-administered encounter medications on file as of 03/11/2021.     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[place]) 02/04/2021   Weight loss, unintentional 02/04/2021   Uncontrolled type 2 diabetes mellitus with hyperosmolar nonketotic hyperglycemia ([place]) 02/04/2021   Essential hypertension 02/04/2021   Chronic diastolic CHF (congestive heart failure) ([place]) 02/04/2021   Syncope 02/04/2021   Emphysema lung ([place]) 10/26/2020   History of 2019 novel coronavirus disease (COVID-19) 10/26/2020   Interstitial lung disease ([place]) 08/24/2020   Chronic respiratory failure with hypoxia ([place]) 06/20/2020   Atherosclerosis of aorta ([place]) 10/12/2019   Long-term insulin use ([place]) 10/12/2019   Vitamin B12 deficiency 06/28/2018   Prostate cancer ([place]) 06/27/2018   Psoriasis 08/16/2017   Advanced care planning/counseling discussion 12/01/2016   Acute kidney injury superimposed on chronic kidney disease ([place]) 08/14/2015   GERD without esophagitis 08/14/2015   Uncontrolled type 2 diabetes mellitus with chronic kidney disease ([place]) 08/13/2015   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[place]) 08/13/2015   Chronic kidney disease, stage 3a ([place]) 02/11/2015   Mixed hyperlipidemia due to type 2 diabetes mellitus ([place]) 02/11/2015   BPH (benign prostatic hyperplasia) 02/11/2015    Conditions to be addressed/monitored:HTN, HLD, DMII, Pulmonary Disease, and falls prevention and safety   Care Plan : RNCM: General Plan of Care (Adult) for Chronic Disease Management and Care Coordination Needs  Updates made by Vanita Ingles, RN since 03/11/2021 12:00 AM     Problem: RNCM: Adult plan of care for Chronic Disease Management and Care Coordination Needs   Priority: High  Onset Date: 03/11/2021     Long-Range Goal: RNCM: Adult plan of care for  Chronic Disease Management and Care Coordination Needs   Start Date: 03/11/2021  Expected End Date: 03/11/2022  This Visit's Progress: On track  Priority: High  Note:   Current Barriers:  Knowledge Deficits related to plan of care for management of HTN, HLD, DMII, Pulmonary Disease, and falls prevention and safety Care Coordination needs related to Limited social support, Level of care concerns, ADL IADL limitations, Family and relationship dysfunction, Social Isolation, Inability to perform IADL's independently, and non-compliance   Chronic Disease Management support and education needs related to HTN, HLD, DMII,  Pulmonary Disease and falls prevention and safety  Lacks caregiver support.  Non-adherence to scheduled provider appointments Non-adherence to prescribed medication regimen  RNCM Clinical Goal(s):  Patient will verbalize understanding of plan for management of HTN, HLD, DMII, Pulmonary Disease, and Falls prevention and safety   verbalize basic understanding of HTN, HLD, DMII, Pulmonary Disease, and falls prevention and safety  disease process and self health management plan   take all medications exactly as prescribed and will call provider for medication related questions demonstrate understanding of rationale for each prescribed medication 03-11-2021: Granddaughter [name] states that she and her daughter are assisting the patient with his medications at this time.  attend all scheduled medical  appointments: 03-13-2021, reminded the patients granddaughter [name] of this appointment today, [facility] has an appointment in the home today  demonstrate improved and ongoing adherence to prescribed treatment plan for HTN, HLD, DMII, Pulmonary Disease, and falls risk and safety  as evidenced by daily monitoring and recording of CBG  adherence to ADA/ carb modified diet adherence to prescribed medication regimen contacting provider for new or worsened symptoms or  questions compliance with the plan of care for effective management of chronic conditions    demonstrate improved and ongoing health management independence for effective management of Chronic diseases  continue to work with RN Care Manager to address care management and care coordination needs related to HTN, HLD, DMII, Pulmonary Disease, and falls prevention and safety   work with Education officer, museum to address Limited social support, Level of care concerns, ADL IADL limitations, Family and relationship dysfunction, Social Isolation, and noncompliance  related to the management of HTN, HLD, DMII, Pulmonary Disease, and falls prevention and safety  demonstrate a decrease in HTN, HLD, DMII, Pulmonary Disease, and falls prevention and safety  exacerbations   demonstrate ongoing self health care management ability and effective management of chronic condtions   through collaboration with Consulting civil engineer, provider, and care team.   Interventions: 1:1 collaboration with primary care provider regarding development and update of comprehensive plan of care as evidenced by provider attestation and co-signature Inter-disciplinary care team collaboration (see longitudinal plan of care) Evaluation of current treatment plan related to  self management and patient's adherence to plan as established by provider   SDOH Barriers (Status: New goal.)  Patient interviewed and SDOH assessment performed        SDOH Interventions    Flowsheet Row Most Recent Value  SDOH Interventions   Food Insecurity Interventions Intervention Not Indicated  Transportation Interventions Intervention Not Indicated     Patient interviewed and appropriate assessments performed Provided patient with information about taking discharge paperwork with the patient to upcoming appointment with the pcp on 03-13-2021. In home visit today with the palliative care services Discussed plans with patient for ongoing care management follow up and  provided patient with direct contact information for care management team Advised patient to call the office for changes in condition, new questions, or concerns Collaborated with primary care provider re: related to patients health and well being and talking with the patient granddaughter today. The patient is currently living with the granddaughter who states she needs help in the home to help with the patients needs.  Collaborated with [facility]  (community agency) re: concerns and needs of the patient getting the care he needs in his current living environment and recent hospitalization for uncontrolled blood sugars and hemoglobin A1C of >15.5 Provided education to patient/caregiver regarding level of care options.    Emphysema  (Status: New goal.) Reviewed medications with patient, including use of prescribed maintenance and rescue inhalers, and provided instruction on medication management and the importance of adherence Provided patient with basic written and verbal emphysema education on self care/management/and exacerbation prevention; Advised patient to track and manage emphysema triggers;  Provided instruction about proper use of medications used for management of emphysema  including inhalers; Advised patient to self assesses emphysema action plan zone and make appointment with provider if in the yellow zone for 48 hours without improvement; Advised patient to engage in light exercise as tolerated 3-5 days a week to aid in the the management of emphysema  Provided education about and advised  patient to utilize infection prevention strategies to reduce risk of respiratory infection; Discussed the importance of adequate rest and management of fatigue with emphysema   Diabetes:  (Status: Goal on track: NO.) Lab Results  Component Value Date   HGBA1C >15.5 (H) 03/02/2021  Assessed patient's understanding of A1c goal: <7% Provided education to patient about basic DM  disease process; Reviewed medications with patient and discussed importance of medication adherence;        Reviewed prescribed diet with patient Heart Healthy/ADA diet; Counseled on importance of regular laboratory monitoring as prescribed;        Discussed plans with patient for ongoing care management follow up and provided patient with direct contact information for care management team;      Provided patient with written educational materials related to hypo and hyperglycemia and importance of correct treatment;       Reviewed scheduled/upcoming provider appointments including: 03-13-2021- reminded the granddaughter [name] of appointment today ;         Advised patient, providing education and rationale, to check cbg as directed  and record        call provider for findings outside established parameters;       Referral made to pharmacy team for assistance with help with DM education, medication reconciliation and support and education for medications ;       Referral made to social work team for assistance with family dynamics and level of care concerns;      Review of patient status, including review of consultants reports, relevant laboratory and other test results, and medications completed;       Advised patient to discuss effective management of DM and other chronic conditions with provider;      Screening for signs and symptoms of depression related to chronic disease state;        Assessed social determinant of health barriers;         Falls:  (Status: New goal.) Provided written and verbal education re: potential causes of falls and Fall prevention strategies Reviewed medications and discussed potential side effects of medications such as dizziness and frequent urination Advised patient of importance of notifying provider of falls Assessed for signs and symptoms of orthostatic hypotension Assessed for falls since last encounter Assessed patients knowledge of fall risk prevention  secondary to previously provided education Provided patient information for fall alert systems Advised patient to discuss safety concerns with provider  Hyperlipidemia:  (Status: Goal on track: YES.) Lab Results  Component Value Date   CHOL 173 02/04/2021   HDL 33 (L) 02/04/2021   LDLCALC 78 02/04/2021   TRIG 388 (H) 02/04/2021     Medication review performed; medication list updated in electronic medical record.  Provider established cholesterol goals reviewed; Counseled on importance of regular laboratory monitoring as prescribed; Provided HLD educational materials; Reviewed role and benefits of statin for ASCVD risk reduction; Discussed strategies to manage statin-induced myalgias; Reviewed importance of limiting foods high in cholesterol; Reviewed exercise goals and target of 150 minutes per week;  Hypertension: (Status: Goal on track: YES.) Last practice recorded BP readings:  BP Readings from Last 3 Encounters:  03/04/21 121/82  02/05/21 115/67  02/04/21 (!) 96/56  Most recent eGFR/CrCl:  Lab Results  Component Value Date   EGFR 47 (L) 02/04/2021    No components found for: CRCL  Evaluation of current treatment plan related to hypertension self management and patient's adherence to plan as established by provider;   Provided education to patient  re: stroke prevention, s/s of heart attack and stroke; Reviewed prescribed diet heart healthy/ADA  Reviewed medications with patient and discussed importance of compliance;  Discussed plans with patient for ongoing care management follow up and provided patient with direct contact information for care management team; Advised patient, providing education and rationale, to monitor blood pressure daily and record, calling PCP for findings outside established parameters;  Reviewed scheduled/upcoming provider appointments including:  Provided education on prescribed diet heart healthy/ADA diet;  Discussed complications of poorly  controlled blood pressure such as heart disease, stroke, circulatory complications, vision complications, kidney impairment, sexual dysfunction;   Patient Goals/Self-Care Activities: Patient will self administer medications as prescribed as evidenced by self report/primary caregiver report  Patient will attend all scheduled provider appointments as evidenced by clinician review of documented attendance to scheduled appointments and patient/caregiver report Patient will call pharmacy for medication refills as evidenced by patient report and review of pharmacy fill history as appropriate Patient will attend church or other social activities as evidenced by patient report Patient will continue to perform ADL's independently as evidenced by patient/caregiver report Patient will continue to perform IADL's independently as evidenced by patient/caregiver report Patient will call provider office for new concerns or questions as evidenced by review of documented incoming telephone call notes and patient report Patient will work with BSW to address care coordination needs and will continue to work with the clinical team to address health care and disease management related needs as evidenced by documented adherence to scheduled care management/care coordination appointments       Plan:Telephone follow up appointment with care management team member scheduled for:  03-18-2021  Noreene Larsson RN, MSN, [facility] Mobile: [phone]

## 2021-03-11 NOTE — Telephone Encounter (Signed)
Scott Lawson was notified and verbalized that Scott Lawson had found another address in encounter notes that Scott Lawson had when Scott Lawson spoke with patient. The address is [place], [place], [place] [address]. Scott Lawson verbalized understanding.

## 2021-03-12 ENCOUNTER — Ambulatory Visit: Payer: Self-pay | Admitting: Licensed Clinical Social Worker

## 2021-03-12 DIAGNOSIS — IMO0002 Reserved for concepts with insufficient information to code with codable children: Secondary | ICD-10-CM

## 2021-03-12 DIAGNOSIS — N1831 Chronic kidney disease, stage 3a: Secondary | ICD-10-CM

## 2021-03-12 DIAGNOSIS — I1310 Hypertensive heart and chronic kidney disease without heart failure, with stage 1 through stage 4 chronic kidney disease, or unspecified chronic kidney disease: Secondary | ICD-10-CM

## 2021-03-12 DIAGNOSIS — I5032 Chronic diastolic (congestive) heart failure: Secondary | ICD-10-CM

## 2021-03-13 ENCOUNTER — Telehealth: Payer: Self-pay

## 2021-03-13 ENCOUNTER — Other Ambulatory Visit: Payer: Self-pay

## 2021-03-13 ENCOUNTER — Ambulatory Visit (INDEPENDENT_AMBULATORY_CARE_PROVIDER_SITE_OTHER): Payer: Medicare Other

## 2021-03-13 ENCOUNTER — Encounter: Payer: Self-pay | Admitting: Nurse Practitioner

## 2021-03-13 ENCOUNTER — Ambulatory Visit (INDEPENDENT_AMBULATORY_CARE_PROVIDER_SITE_OTHER): Payer: Medicare Other | Admitting: Nurse Practitioner

## 2021-03-13 VITALS — BP 88/55 | HR 80 | Temp 98.5°F | Wt 171.0 lb

## 2021-03-13 DIAGNOSIS — R413 Other amnesia: Secondary | ICD-10-CM

## 2021-03-13 DIAGNOSIS — J849 Interstitial pulmonary disease, unspecified: Secondary | ICD-10-CM | POA: Diagnosis not present

## 2021-03-13 DIAGNOSIS — Z0000 Encounter for general adult medical examination without abnormal findings: Secondary | ICD-10-CM

## 2021-03-13 DIAGNOSIS — R531 Weakness: Secondary | ICD-10-CM

## 2021-03-13 DIAGNOSIS — W19XXXS Unspecified fall, sequela: Secondary | ICD-10-CM | POA: Diagnosis not present

## 2021-03-13 DIAGNOSIS — E1165 Type 2 diabetes mellitus with hyperglycemia: Secondary | ICD-10-CM | POA: Diagnosis not present

## 2021-03-13 DIAGNOSIS — IMO0002 Reserved for concepts with insufficient information to code with codable children: Secondary | ICD-10-CM

## 2021-03-13 DIAGNOSIS — Z09 Encounter for follow-up examination after completed treatment for conditions other than malignant neoplasm: Secondary | ICD-10-CM | POA: Diagnosis not present

## 2021-03-13 DIAGNOSIS — E1122 Type 2 diabetes mellitus with diabetic chronic kidney disease: Secondary | ICD-10-CM

## 2021-03-13 DIAGNOSIS — I10 Essential (primary) hypertension: Secondary | ICD-10-CM

## 2021-03-13 DIAGNOSIS — N1831 Chronic kidney disease, stage 3a: Secondary | ICD-10-CM

## 2021-03-13 NOTE — Progress Notes (Signed)
Subjective:   Scott Lawson. is a 80 y.o. male who presents for Medicare Annual/Subsequent preventive examination.I connected with  [name] on 03/13/21 by a audio enabled telemedicine application and verified that I am speaking with the correct person using two identifiers.   I discussed the limitations of evaluation and management by telemedicine. The patient expressed understanding and agreed to proceed.   Location of patient:home Location of provider: office [facility] completed visit with: Linus Galas, CMA  Review of Systems    Defer to PCP       Objective:    Today's Vitals   03/13/21 1906  PainSc: 0-No pain   There is no height or weight on file to calculate BMI.  Advanced Directives 03/13/2021 02/28/2021 02/04/2021 06/23/2020 11/23/2019 11/21/2018 12/23/2017  Does Patient Have a Medical Advance Directive? Yes No No No No No No  Type of Advance Directive [place]  Does patient want to make changes to medical advance directive? - - - - - - -  Copy of [place] in Chart? - - - - - - -  Would patient like information on creating a medical advance directive? No - Patient declined No - Patient declined - No - Patient declined - - No - Patient declined    Current Medications (verified) Outpatient Encounter Medications as of 03/13/2021  Medication Sig   albuterol (VENTOLIN HFA) 108 (90 Base) MCG/ACT inhaler Inhale 2 puffs into the lungs every 6 (six) hours as needed for wheezing or shortness of breath.   aspirin EC 81 MG tablet Take 81 mg by mouth daily. Swallow whole.   atorvastatin (LIPITOR) 80 MG tablet TAKE 1 TABLET EVERY DAY   fluticasone (FLONASE) 50 MCG/ACT nasal spray Place 2 sprays into both nostrils daily as needed.    insulin glargine (LANTUS) 100 UNIT/ML injection Inject 0.35 mLs (35 Units total) into the skin every evening. 1800   insulin lispro (HUMALOG KWIKPEN) 100 UNIT/ML KwikPen Inject 15 Units into  the skin 3 (three) times daily before meals. Continue to check blood sugar after meals.   metFORMIN (GLUCOPHAGE) 500 MG tablet Take 2 tablets (1,000 mg total) by mouth 2 (two) times daily with a meal. 1000mg  in the morning 1000mg  at night   midodrine (PROAMATINE) 10 MG tablet Take 1 tablet (10 mg total) by mouth 3 (three) times daily with meals.   Omega-3 Fatty Acids (OMEGA 3 500 PO) Take 500 mg by mouth daily.   omeprazole (PRILOSEC) 20 MG capsule Take 1 capsule (20 mg total) by mouth daily.   tamsulosin (FLOMAX) 0.4 MG CAPS capsule TAKE 1 CAPSULE EVERY DAY   TRUE METRIX BLOOD GLUCOSE TEST test strip CHECK BLOOD SUGAR TWICE DAILY   TRUEplus Lancets 33G MISC CHECK BLOOD SUGAR TWICE DAILY   No facility-administered encounter medications on file as of 03/13/2021.    Allergies (verified) Gabapentin   History: Past Medical History:  Diagnosis Date   BPH (benign prostatic hyperplasia)    Diabetes mellitus without complication (HCC)    Elevated PSA    GERD (gastroesophageal reflux disease)    Hypertension    Hypertriglyceridemia    Nocturia    Past Surgical History:  Procedure Laterality Date   APPENDECTOMY     CARPAL TUNNEL RELEASE Left 12/11/2015   Procedure: CARPAL TUNNEL RELEASE;  Surgeon: Earnestine Leys, MD;  Location: [facility] ORS;  Service: Orthopedics;  Laterality: Left;   EYE SURGERY  laser   TRANSURETHRAL RESECTION OF PROSTATE N/A 08/02/2017   Procedure: TRANSURETHRAL RESECTION OF THE PROSTATE (TURP);  Surgeon: Hollice Espy, MD;  Location: [facility] ORS;  Service: Urology;  Laterality: N/A;   Family History  Problem Relation Age of Onset   Cancer Mother        lung   Heart disease Father    Diabetes Brother    Prostate cancer Neg Hx    Bladder Cancer Neg Hx    Kidney cancer Neg Hx    Social History   Socioeconomic History   Marital status: Married    Spouse name: Not on file   Number of children: Not on file   Years of education: Not on file   Highest education  level: High school graduate  Occupational History   Occupation: retired  Tobacco Use   Smoking status: Never   Smokeless tobacco: Never  Vaping Use   Vaping Use: Never used  Substance and Sexual Activity   Alcohol use: No    Alcohol/week: 0.0 standard drinks   Drug use: No   Sexual activity: Yes  Other Topics Concern   Not on file  Social History Narrative   Not on file   Social Determinants of Health   Financial Resource Strain: Low Risk    Difficulty of Paying Living Expenses: Not hard at all  Food Insecurity: No Food Insecurity   Worried About Charity fundraiser in the Last Year: Never true   [place] in the Last Year: Never true  Transportation Needs: No Transportation Needs   Lack of Transportation (Medical): No   Lack of Transportation (Non-Medical): No  Physical Activity: Sufficiently Active   Days of Exercise per Week: 5 days   Minutes of Exercise per Session: 30 min  Stress: Not on file  Social Connections: Moderately Isolated   Frequency of Communication with Friends and Family: More than three times a week   Frequency of Social Gatherings with Friends and Family: Three times a week   Attends Religious Services: More than 4 times per year   Active Member of Clubs or Organizations: No   Attends Archivist Meetings: Never   Marital Status: Widowed    Tobacco Counseling Counseling given: Not Answered   Clinical Intake:  Pre-visit preparation completed: Yes  Pain : No/denies pain Pain Score: 0-No pain     Nutritional Risks: None Diabetes: Yes CBG done?: No Did pt. bring in CBG monitor from home?: No  How often do you need to have someone help you when you read instructions, pamphlets, or other written materials from your doctor or pharmacy?: 1 - Never What is the last grade level you completed in school?: 12th  Diabetic?yes  Interpreter Needed?: No  Information entered by :: Elenor Quinones   Activities of Daily Living In  your present state of health, do you have any difficulty performing the following activities: 02/28/2021 06/23/2020  Hearing? N N  Vision? N N  Difficulty concentrating or making decisions? Y N  Walking or climbing stairs? Y N  Dressing or bathing? N N  Doing errands, shopping? Y N  Some recent data might be hidden    Patient Care Team: Venita Lick, NP as PCP - General (Nurse Practitioner) Hollice Espy, MD as Consulting Physician (Urology) Vladimir Faster, [facility] (Pharmacist) Vanita Ingles, RN as Case Manager Rebekah Chesterfield, LCSW as Social Worker (Licensed Clinical Social Worker)  Indicate any recent Medical Services you may have  received from other than [facility] providers in the past year (date may be approximate).     Assessment:   This is a routine wellness examination for [place].  Hearing/Vision screen No results found.  Dietary issues and exercise activities discussed: Current Exercise Habits: Home exercise routine, Type of exercise: treadmill;walking, Time (Minutes): 30, Frequency (Times/Week): 5, Weekly Exercise (Minutes/Week): 150, Intensity: Moderate   Goals Addressed   None    Depression Screen PHQ 2/9 Scores 03/13/2021 08/02/2020 11/23/2019 11/21/2018 11/19/2017 11/18/2016 11/18/2016  PHQ - 2 Score 0 0 0 1 0 0 0  PHQ- 9 Score - - - - - 0 -    Fall Risk Fall Risk  03/13/2021 08/02/2020 11/23/2019 12/29/2018 11/21/2018  Falls in the past year? 1 0 0 0 0  Comment 10 months ago-no injury - - - -  Number falls in past yr: 0 - 0 0 -  Injury with Fall? 0 - 0 0 -  Comment - - - - -  Risk for fall due to : No Fall Risks Other (Comment) - - -  Risk for fall due to: Comment - Uses Rainey Rodger and O2 - - -  Follow up Falls prevention discussed Falls evaluation completed - - -    FALL RISK PREVENTION PERTAINING TO THE HOME:  Any stairs in or around the home? No  If so, are there any without handrails?  N/A Home free of loose throw rugs in walkways, pet beds, electrical cords, etc?  Yes  Adequate lighting in your home to reduce risk of falls? Yes   ASSISTIVE DEVICES UTILIZED TO PREVENT FALLS:  Life alert? No  Use of a cane, [place] or w/c? Yes  Grab bars in the bathroom? Yes  Shower chair or bench in shower? Yes  Elevated toilet seat or a handicapped toilet? No   TIMED UP AND GO:  Was the test performed?  N/A .  Length of time to ambulate 10 feet: N/A sec.     Cognitive Function: MMSE - Mini Mental State Exam 03/13/2021  Orientation to time 4  Orientation to Place 2  Registration 2  Attention/ Calculation 2  Recall 2  Language- name 2 objects 2  Language- repeat 1  Language- follow 3 step command 3  Language- read & follow direction 1  Write a sentence 0  Copy design 1  Total score 20     6CIT Screen 03/13/2021 11/21/2018 11/19/2017 11/18/2016  What Year? 0 points 0 points 0 points 0 points  What month? 0 points 0 points 0 points 0 points  What time? 0 points 0 points 0 points 0 points  Count back from 20 0 points 0 points 0 points 0 points  Months in reverse 0 points 0 points 0 points 0 points  Repeat phrase 0 points 0 points 2 points 4 points  Total Score 0 0 2 4    Immunizations Immunization History  Administered Date(s) Administered   Fluad Quad(high Dose 65+) 03/31/2019, 02/27/2020   Influenza, High Dose Seasonal PF 03/03/2016, 03/08/2017   Influenza,inj,Quad PF,6+ Mos 05/15/2015   Influenza,trivalent, recombinat, inj, PF 03/03/2018   Influenza-Unspecified 06/15/2012, 03/15/2013, 03/24/2015, 05/15/2015, 03/15/2016, 03/15/2017, 02/19/2018, 04/15/2020, 05/03/2020   PFIZER(Purple Top)SARS-COV-2 Vaccination 08/05/2020, 08/27/2020   Pneumococcal Conjugate-13 10/16/2013, 12/26/2014, 04/15/2017   Pneumococcal Polysaccharide-23 11/13/2012, 12/06/2012   Tdap 12/23/2012, 11/23/2013, 03/13/2020   Zoster Recombinat (Shingrix) 02/27/2020, 05/16/2020    TDAP status: Up to date  Flu Vaccine status: Up to date  Pneumococcal vaccine status: Up to  date  Covid-19 vaccine status: Completed vaccines  Qualifies for Shingles Vaccine? No   Zostavax completed Yes   Shingrix Completed?: Yes  Screening Tests Health Maintenance  Topic Date Due   OPHTHALMOLOGY EXAM  09/28/2018   COVID-19 Vaccine (3 - Pfizer risk series) 09/24/2020   INFLUENZA VACCINE  01/13/2021   FOOT EXAM  08/26/2021   HEMOGLOBIN A1C  08/30/2021   URINE MICROALBUMIN  11/26/2021   TETANUS/TDAP  03/13/2030   Hepatitis C Screening  Completed   Zoster Vaccines- Shingrix  Completed   HPV VACCINES  Aged Out    Health Maintenance  Health Maintenance Due  Topic Date Due   OPHTHALMOLOGY EXAM  09/28/2018   COVID-19 Vaccine (3 - Pfizer risk series) 09/24/2020   INFLUENZA VACCINE  01/13/2021    Colorectal cancer screening: No longer required.   Lung Cancer Screening: (Low Dose CT Chest recommended if Age 15-80 years, 30 pack-year currently smoking OR have quit w/in 15years.) does not qualify.   Lung Cancer Screening Referral: N/A  Additional Screening:  Hepatitis C Screening: does not qualify; Completed   Vision Screening: Recommended annual ophthalmology exams for early detection of glaucoma and other disorders of the eye. Is the patient up to date with their annual eye exam?  Yes  Who is the provider or what is the name of the office in which the patient attends annual eye exams?  If pt is not established with a provider, would they like to be referred to a provider to establish care?  N/A .   Dental Screening: Recommended annual dental exams for proper oral hygiene  Community Resource Referral / Chronic Care Management: CRR required this visit?  No   CCM required this visit?  No      Plan:     I have personally reviewed and noted the following in the patient's chart:   Medical and social history Use of alcohol, tobacco or illicit drugs  Current medications and supplements including opioid prescriptions. Patient is not currently taking opioid  prescriptions. Functional ability and status Nutritional status Physical activity Advanced directives List of other physicians Hospitalizations, surgeries, and ER visits in previous 12 months Vitals Screenings to include cognitive, depression, and falls Referrals and appointments  In addition, I have reviewed and discussed with patient certain preventive protocols, quality metrics, and best practice recommendations. A written personalized care plan for preventive services as well as general preventive health recommendations were provided to patient.     Linus Galas, [place]   03/13/2021   Nurse Notes: non face to face 69 minutes  Mr. Pietrzak , Thank you for taking time to come for your Medicare Wellness Visit. I appreciate your ongoing commitment to your health goals. Please review the following plan we discussed and let me know if I can assist you in the future.   These are the goals we discussed:  Goals       Patient Stated     PharmD "I want to manage my diabetes as best I can" (pt-stated)      [place] (see longtitudinal plan of care for additional care plan information)  Current Barriers:  Diabetes: uncontrolled; most recent A1c 10% Notes today that he was told to hold ASA 81 mg daily and meloxicam 15 mg daily. Is unsure if he should continue to hold these. Notes that he was told that ulcers were healing w/ omeprazole Current antihyperglycemic regimen: metformin 500 mg BID, Lantus 30 units (however, reports that he will sometimes give  25-28 units if he is afraid his sugar will drop low overnight), Humalog 10/24/12 units with meals - has been holding insulins while hold Max tolerated dose of metformin  Hx GI upset with Ozempic therapy; [facility] would not cover Victoza Intolerability to SGLT2 Denies episodes of hypoglycemia Current glucose readings:  Fasting: ~130-135 After breakfast: ~110-115 After lunch: ~175-180s After supper: ~190-200s Over the next 90 days, patient with  work with PharmD and primary care provider to address optimized medication management  Interventions: Comprehensive medication review performed, medication list updated in electronic medical record Inter-disciplinary care team collaboration (see longitudinal plan of care) Encouraged to continue to hold meloxicam and ASA until further instruction from GI provider at [facility]. Likely minimize use of NSAIDs moving forward if possible Reviewed goal A1c, goal fasting, and goal 2 hour post prandial sugar readings. Considered increasing supper insulin dose, but then patient worries about overnight hypoglycemia. Continue current regimen. Encouraged to bring glucose readings with him to next PCP visit  Patient Self Care Activities:  Patient will check blood glucose BID, document, and provide at future appointments Patient will take medications as prescribed Patient will report any questions or concerns to provider   Please see past updates related to this goal by clicking on the "Past Updates" button in the selected goal         Other     DIET - [place] drinking at least 6-8 glasses of water a day       Increase water intake      Recommend to continue drinking at least 4-5 glasses of water a day.       RNCM: Monitor and Manage My Blood Sugar-Diabetes Type 2      Timeframe:  Long-Range Goal Priority:  High Start Date:    03-11-2021                         Expected End Date:   03-11-2022                    Follow Up Date 03/18/2021    - check blood sugar at prescribed times - check blood sugar before and after exercise - check blood sugar if I feel it is too high or too low - enter blood sugar readings and medication or insulin into daily log - take the blood sugar log to all doctor visits - take the blood sugar meter to all doctor visits    Why is this important?   Checking your blood sugar at home helps to keep it from getting very high or very low.  Writing the  results in a diary or log helps the doctor know how to care for you.  Your blood sugar log should have the time, date and the results.  Also, write down the amount of insulin or other medicine that you take.  Other information, like what you ate, exercise done and how you were feeling, will also be helpful.    Lab Results  Component Value Date   HGBA1C >15.5 (H) 03/02/2021      03-11-2021: No home readings obtained today. The patient with recent hospitalization and readings >300. Last recorded reading was 314 on 03-04-2021. Will work with the patient and patients family on DM education and support.       RNCM: Prevent Falls and Injury      Follow Up Date 03/18/2021    -  add more outdoor lighting - always use handrails on the stairs - always wear low-heeled or flat shoes or slippers with nonskid soles - call the doctor if I am feeling too drowsy - install bathroom grab bars - join an exercise group in my community - keep a flashlight by the bed - keep my cell phone with me always - learn how to get back up if I fall - make an emergency alert plan in case I fall - pick up clutter from the floors - use a nonslip pad with throw rugs, or remove them completely - use a cane or Vidal Lampkins - use a nightlight in the bathroom - wear my glasses and/or hearing aid    Why is this important?   Most falls happen when it is hard for you to walk safely. Your balance may be off because of an illness. You may have pain in your knees, hip or other joints.  You may be overly tired or taking medicines that make you sleepy. You may not be able to see or hear clearly.  Falls can lead to broken bones, bruises or other injuries.  There are things you can do to help prevent falling.     03-11-2021: The patient is currently living with his granddaughter [name] Difrancesco. She states she needs help in the home. Patient with recent hospitalization. Per granddaughter palliative care is to follow up in the home today  and post hospital appointment is 03-13-2021. Reminded the granddaughter to bring discharge instructions with her to the appointment and it was imperative to keep appointment with palliative care services today.       RNCM: Set My Target A1C-Diabetes Type 2      Timeframe:  Long-Range Goal Priority:  High Start Date:    03-11-2021                         Expected End Date:          06-13-2021             Follow Up Date 03/18/2021    - set target A1C    Why is this important?   Your target A1C is decided together by you and your doctor.  It is based on several things like your age and other health issues.     Lab Results  Component Value Date   HGBA1C >15.5 (H) 03/02/2021    03-11-2021: Evidence of non-compliance with medications and treatment plan for DM as evidence of recent hospitalization. The patient was out of insulin x 4 days per the notes. Spoke with the patients granddaughter today and she states the patient is compliant with medications that she and her daughter assist with his medications. Has follow ups with the pcp on 03-13-2021 and [facility] palliative services today. Expressed that it was imperative to keep appointments so the patient could get the needed help for expressed needs.       RNCM: Track and Manage My Blood Pressure-Hypertension      Timeframe:  Long-Range Goal Priority:  High Start Date:            03-11-2021                 Expected End Date:       03-11-2022                Follow Up Date 03/18/2021    - check blood pressure 3 times per week -  check blood pressure daily - check blood pressure weekly - choose a place to take my blood pressure (home, clinic or office, retail store) - write blood pressure results in a log or diary    Why is this important?   You won't feel high blood pressure, but it can still hurt your blood vessels.  High blood pressure can cause heart or kidney problems. It can also cause a stroke.  Making lifestyle changes like losing a  little weight or eating less salt will help.  Checking your blood pressure at home and at different times of the day can help to control blood pressure.  If the doctor prescribes medicine remember to take it the way the doctor ordered.  Call the office if you cannot afford the medicine or if there are questions about it.     BP Readings from Last 3 Encounters:  03/04/21 121/82  02/05/21 115/67  02/04/21 (!) 96/56    03-11-2021: The patient has good control of HTN at this time. Will continue to monitor for changes and needs. Patient has had decline in health since being COVID positive. Working with the patient and the family to help with effective management of chronic conditions.       RNCML: Manage My Medicine      Timeframe:  Long-Range Goal Priority:  High Start Date:     03-11-2021                        Expected End Date:       03-11-2022                Follow Up Date 03/18/2021    - call for medicine refill 2 or 3 days before it runs out - call if I am sick and can't take my medicine - keep a list of all the medicines I take; vitamins and herbals too - learn to read medicine labels - use a pillbox to sort medicine - use an alarm clock or phone to remind me to take my medicine    Why is this important?   These steps will help you keep on track with your medicines.   03-11-2021: Spoke with [name] the patients granddaughter. She and her daughter are helping the patient with managing his medications. She states that the patient is now compliant with medications. Encouraged [name] to bring discharge paperwork with her to the patient appointment on 03-13-2021. Has a palliative appointment scheduled for today, expressed to [name] it was imperative that the NP come today to see patient to address concerns and needs of the patient.         This is a list of the screening recommended for you and due dates:  Health Maintenance  Topic Date Due   Eye exam for diabetics  09/28/2018    COVID-19 Vaccine (3 - Pfizer risk series) 09/24/2020   Flu Shot  01/13/2021   Complete foot exam   08/26/2021   Hemoglobin A1C  08/30/2021   Urine Protein Check  11/26/2021   Tetanus Vaccine  03/13/2030   Hepatitis C Screening: USPSTF Recommendation to screen - Ages 18-79 yo.  Completed   Zoster (Shingles) Vaccine  Completed   HPV Vaccine  Aged Out

## 2021-03-13 NOTE — Assessment & Plan Note (Signed)
Chronic, acute kidney injury in hospital, however improved with fluids. Will check CMP and CBC today.

## 2021-03-13 NOTE — Patient Instructions (Addendum)
[facility] [place] have been taking care of his oxygen: Phone: [place] he has been seeing Dr. Meyer Russel who can help with prescriptions being filled there: Phone: [phone]  Will set up Home health nurse, physical therapy, occupational therapy, and aide

## 2021-03-13 NOTE — Assessment & Plan Note (Signed)
Multiple falls at home recently, the last one 3 days ago. Will set up [facility] RN, PT, OT, and aide. Will order rolling walker and bedside commode and help get resources on installing grab rails in the shower.

## 2021-03-13 NOTE — Chronic Care Management (AMB) (Signed)
    Clinical Social Work  Chronic Care Management   Phone Outreach    03/12/2021 Name: Scott Lawson. MRN: [id number] DOB: [birth year]  [name] is a 80 y.o. year old male who is a primary care patient of Cannady, Barbaraann Faster, NP .   Reason for referral: [employer] , Level of Care Concerns, and Caregiver Stress.    CCM LCSW collaborated with patient' medical team regarding patient care and identified needs. CCM LCSW reached out to patient today by phone to introduce self, assess needs and offer Care Management services and interventions.   LCSW spoke with patient's grand-daughter, [name], whom patient is residing with. She shared that she needs assistance with in-home care to provide supervision of patient, so that she may return to work. [name] shared strong interest in CAP and/or PCS services, stating that she would love to care for him full-time. CCM LCSW discussed the process of applying for programs, in addition, to an eligibility requirement of patient having Medicaid   Patient's granddaughter confirmed awareness of patient's upcoming appt with PCP. She states that she will request a walker, portable oxygen tank, potty chair, and railings for bathroom/shower  Plan: Appointment was scheduled with CCM LCSW for 03/14/21  Review of patient status, including review of consultants reports, relevant laboratory and other test results, and collaboration with appropriate care team members and the patient's provider was performed as part of comprehensive patient evaluation and provision of care management services.     Christa See, MSW, [email or web address] Phone [phone] 9:29 AM

## 2021-03-13 NOTE — Assessment & Plan Note (Signed)
Slowly seeing decline in cognition over the past few months. Has missed several appointments. MMSE score was 20 today. He is currently living with his granddaughter who is helping to care for him around the clock. Will place referral to neurology. Follow up with PCP in 2 weeks.

## 2021-03-13 NOTE — Patient Instructions (Signed)
Health Maintenance, Male Adopting a healthy lifestyle and getting preventive care are important in promoting health and wellness. Ask your health care provider about: The right schedule for you to have regular tests and exams. Things you can do on your own to prevent diseases and keep yourself healthy. What should I know about diet, weight, and exercise? Eat a healthy diet  Eat a diet that includes plenty of vegetables, fruits, low-fat dairy products, and lean protein. Do not eat a lot of foods that are high in solid fats, added sugars, or sodium. Maintain a healthy weight Body mass index (BMI) is a measurement that can be used to identify possible weight problems. It estimates body fat based on height and weight. Your health care provider can help determine your BMI and help you achieve or maintain a healthy weight. Get regular exercise Get regular exercise. This is one of the most important things you can do for your health. Most adults should: Exercise for at least 150 minutes each week. The exercise should increase your heart rate and make you sweat (moderate-intensity exercise). Do strengthening exercises at least twice a week. This is in addition to the moderate-intensity exercise. Spend less time sitting. Even light physical activity can be beneficial. Watch cholesterol and blood lipids Have your blood tested for lipids and cholesterol at 79 years of age, then have this test every 5 years. You may need to have your cholesterol levels checked more often if: Your lipid or cholesterol levels are high. You are older than 79 years of age. You are at high risk for heart disease. What should I know about cancer screening? Many types of cancers can be detected early and may often be prevented. Depending on your health history and family history, you may need to have cancer screening at various ages. This may include screening for: Colorectal cancer. Prostate cancer. Skin cancer. Lung  cancer. What should I know about heart disease, diabetes, and high blood pressure? Blood pressure and heart disease High blood pressure causes heart disease and increases the risk of stroke. This is more likely to develop in people who have high blood pressure readings, are of African descent, or are overweight. Talk with your health care provider about your target blood pressure readings. Have your blood pressure checked: Every 3-5 years if you are 18-39 years of age. Every year if you are 40 years old or older. If you are between the ages of 65 and 75 and are a current or former smoker, ask your health care provider if you should have a one-time screening for abdominal aortic aneurysm (AAA). Diabetes Have regular diabetes screenings. This checks your fasting blood sugar level. Have the screening done: Once every three years after age 45 if you are at a normal weight and have a low risk for diabetes. More often and at a younger age if you are overweight or have a high risk for diabetes. What should I know about preventing infection? Hepatitis B If you have a higher risk for hepatitis B, you should be screened for this virus. Talk with your health care provider to find out if you are at risk for hepatitis B infection. Hepatitis C Blood testing is recommended for: Everyone born from 1945 through 1965. Anyone with known risk factors for hepatitis C. Sexually transmitted infections (STIs) You should be screened each year for STIs, including gonorrhea and chlamydia, if: You are sexually active and are younger than 79 years of age. You are older than 79 years   of age and your health care provider tells you that you are at risk for this type of infection. Your sexual activity has changed since you were last screened, and you are at increased risk for chlamydia or gonorrhea. Ask your health care provider if you are at risk. Ask your health care provider about whether you are at high risk for HIV.  Your health care provider may recommend a prescription medicine to help prevent HIV infection. If you choose to take medicine to prevent HIV, you should first get tested for HIV. You should then be tested every 3 months for as long as you are taking the medicine. Follow these instructions at home: Lifestyle Do not use any products that contain nicotine or tobacco, such as cigarettes, e-cigarettes, and chewing tobacco. If you need help quitting, ask your health care provider. Do not use street drugs. Do not share needles. Ask your health care provider for help if you need support or information about quitting drugs. Alcohol use Do not drink alcohol if your health care provider tells you not to drink. If you drink alcohol: Limit how much you have to 0-2 drinks a day. Be aware of how much alcohol is in your drink. In the [place] one drink equals one 12 oz bottle of beer (355 mL), one 5 oz glass of wine (148 mL), or one 1 oz glass of hard liquor (44 mL). General instructions Schedule regular health, dental, and eye exams. Stay current with your vaccines. Tell your health care provider if: You often feel depressed. You have ever been abused or do not feel safe at home. Summary Adopting a healthy lifestyle and getting preventive care are important in promoting health and wellness. Follow your health care provider's instructions about healthy diet, exercising, and getting tested or screened for diseases. Follow your health care provider's instructions on monitoring your cholesterol and blood pressure. This information is not intended to replace advice given to you by your health care provider. Make sure you discuss any questions you have with your health care provider. Document Revised: 08/09/2020 Document Reviewed: 05/25/2018 Elsevier Patient Education  2022 [employer]  

## 2021-03-13 NOTE — Assessment & Plan Note (Signed)
Blood pressure low today 88/55. He was unable to pick up the midodrine at the pharmacy as there was a cost issue. We called the [place] and spoke to [place] about getting medications sent through [place]. She states she will give the note and information to Dr. Meyer Russel to help follow up with medications needed.

## 2021-03-13 NOTE — Telephone Encounter (Signed)
03/13/21 @ 10:30 AM: Palliative care SW outreached patients granddaughter, [name], to address questions, concerns and needs, per [facility] NP request.  Call unsuccessful. SW left HIPPA compliant VM, requesting return call.

## 2021-03-13 NOTE — Assessment & Plan Note (Signed)
Recently in hospital on insulin drip. He was able to pick up his long acting insulin, however couldn't afford his short acting insulin. He also doesn't have a working glucometer at home. We called the [place] and spoke to [place] about getting medications sent through [place]. She states she will give the note and information to Dr. Meyer Russel to help follow up with medications needed.

## 2021-03-13 NOTE — Assessment & Plan Note (Signed)
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 Continue oxygen at home. Number given for [facility] pulmonology who has set up the oxygen to his granddaughter to call with any questions or concerns.

## 2021-03-13 NOTE — Progress Notes (Signed)
Established Patient Office Visit  Subjective:  Patient ID: Scott Gunning., male    DOB: 07-04-1940  Age: 80 y.o. MRN: [id number]  CC:  Chief Complaint  Patient presents with   Hyperglycemia   Diabetes   Medication Refill    Patient granddaughter states she needs to know how to go about to get refills on his medications and his long acting insulin.    Follow-up    Would like to discuss portable oxygen, grab bar in the bathroom and walker. Patient states he needs another glucose meter to check his sugar levels.     HPI Scott Gunning. presents for follow up after hospital discharge for elevated blood sugars, hypotension, and pneumonia.  He has been doing well since leaving the hospital. He has been staying with his granddaughter, Scott Lawson, who has been helping provide care and with his medications. Palliative care went out to his house on 03/11/21 and states that he did not get his short acting insulin and midodrine after being discharged from the hospital. His granddaughter states that they were sent to [facility] and he is unable to afford them.   Of note, he has been missing appointments and becoming more forgetful at home. During the hospital, he had acute metabolic encephalopathy, however this has improved.   His ganddaughter states that they have not heard from home health about setting up RN, PT, OT, and aide, he needs a rolling walker, bedside commode, and grab rails in the shower.   Transition of Care Hospital Follow up.   Hospital/Facility: [facility] D/C Physician: Dr. Osvaldo Shipper D/C Date: 03/04/21  Records Requested: 03/13/21 Records Received:  03/13/21 Records Reviewed: 03/13/21  "Admit date: 02/28/2021 Discharge date:   03/04/2021    PCP: Marjie Skiff, NP   DISCHARGE DIAGNOSES:  Shock likely due to combination of sepsis as well as hypovolemia Community-acquired pneumonia Acute respiratory failure with hypoxia, resolved Acute kidney injury,  resolved Hyperosmolar hyperglycemic state, resolved Insulin-dependent diabetes mellitus, uncontrolled with hyperglycemia, noncompliant Acute metabolic encephalopathy, resolved Essential hypertension Dyslipidemia Normocytic anemia   RECOMMENDATIONS FOR OUTPATIENT FOLLOW UP: Home health has been ordered Consider ambulatory referral to neurology to assess cognitive function    Home Health: PT and OT Equipment/Devices: Rolling walker   CODE STATUS: Full code   DISCHARGE CONDITION: fair   Diet recommendation: Modified carbohydrate Discharge Instructions       Call MD for:  difficulty breathing, headache or visual disturbances   Complete by: As directed      Call MD for:  extreme fatigue   Complete by: As directed      Call MD for:  persistant dizziness or light-headedness   Complete by: As directed      Call MD for:  persistant nausea and vomiting   Complete by: As directed      Call MD for:  severe uncontrolled pain   Complete by: As directed      Call MD for:  temperature >100.4   Complete by: As directed      Diet Carb Modified   Complete by: As directed      Discharge instructions   Complete by: As directed      Please take your medications as prescribed.  Please be sure to check your glucose levels at home at least 3 times a day before each meal.  Please take your insulin as prescribed.  Do not miss any doses.  Seek attention if your glucose levels are consistently greater than  200.   You were cared for by a hospitalist during your hospital stay. If you have any questions about your discharge medications or the care you received while you were in the hospital after you are discharged, you can call the unit and asked to speak with the hospitalist on call if the hospitalist that took care of you is not available. Once you are discharged, your primary care physician will handle any further medical issues. Please note that NO REFILLS for any discharge medications will be  authorized once you are discharged, as it is imperative that you return to your primary care physician (or establish a relationship with a primary care physician if you do not have one) for your aftercare needs so that they can reassess your need for medications and monitor your lab values. If you do not have a primary care physician, you can call [phone] for a physician referral.    Increase activity slowly   Complete by: As directed         "   Diagnoses on Discharge: Uncontrolled diabetes with hyperglycemia, hypotension, community acquired pneumonia, acute kidney injury, acute metabolic encephalopathy  Date of interactive Contact within 48 hours of discharge:  Contact was through: phone  Date of 7 day or 14 day face-to-face visit:    within 14 days  Outpatient Encounter Medications as of 03/13/2021  Medication Sig   albuterol (VENTOLIN HFA) 108 (90 Base) MCG/ACT inhaler Inhale 2 puffs into the lungs every 6 (six) hours as needed for wheezing or shortness of breath.   aspirin EC 81 MG tablet Take 81 mg by mouth daily. Swallow whole.   atorvastatin (LIPITOR) 80 MG tablet TAKE 1 TABLET EVERY DAY   fluticasone (FLONASE) 50 MCG/ACT nasal spray Place 2 sprays into both nostrils daily as needed.    insulin glargine (LANTUS) 100 UNIT/ML injection Inject 0.35 mLs (35 Units total) into the skin every evening. 1800   metFORMIN (GLUCOPHAGE) 500 MG tablet Take 2 tablets (1,000 mg total) by mouth 2 (two) times daily with a meal. 1037m in the morning 10084mat night   Omega-3 Fatty Acids (OMEGA 3 500 PO) Take 500 mg by mouth daily.   omeprazole (PRILOSEC) 20 MG capsule Take 1 capsule (20 mg total) by mouth daily.   tamsulosin (FLOMAX) 0.4 MG CAPS capsule TAKE 1 CAPSULE EVERY DAY   TRUE METRIX BLOOD GLUCOSE TEST test strip CHECK BLOOD SUGAR TWICE DAILY   TRUEplus Lancets 33G MISC CHECK BLOOD SUGAR TWICE DAILY   insulin lispro (HUMALOG KWIKPEN) 100 UNIT/ML KwikPen Inject 15 Units into the skin 3  (three) times daily before meals. Continue to check blood sugar after meals. (Patient not taking: No sig reported)   midodrine (PROAMATINE) 10 MG tablet Take 1 tablet (10 mg total) by mouth 3 (three) times daily with meals. (Patient not taking: No sig reported)   [DISCONTINUED] predniSONE (DELTASONE) 20 MG tablet Take 3 tablets once daily for 3 days followed by 2 tablets once daily for 3 days followed by 1 tablet once daily for 3 days and then stop (Patient not taking: No sig reported)   No facility-administered encounter medications on file as of 03/13/2021.    Diagnostic Tests Reviewed/Disposition: Reviewed on chart  Consults: Critical care  Discharge Instructions As above  Disease/illness Education: Educated patient and granddaughter on illness  Home Health/Community Services Discussions/Referrals: Has palliative care, will order home health RN, PT, OT, aide  Establishment or re-establishment of referral orders for community resources: Has palliative care,  will order home health RN, PT, OT, aide  Discussion with other health care providers: Reviewed notes, discussed with palliative, case manager, and social worker  Assessment and Support of treatment regimen adherence: Reviewed with patient and granddaughter today  Appointments Coordinated with: Home health, will place referral to neurology   Education for self-management, independent living, and ADLs:  Reviewed with patient and granddaughter today. Will order walker   Past Medical History:  Diagnosis Date   BPH (benign prostatic hyperplasia)    Diabetes mellitus without complication (HCC)    Elevated PSA    GERD (gastroesophageal reflux disease)    Hypertension    Hypertriglyceridemia    Nocturia     Past Surgical History:  Procedure Laterality Date   APPENDECTOMY     CARPAL TUNNEL RELEASE Left 12/11/2015   Procedure: CARPAL TUNNEL RELEASE;  Surgeon: Earnestine Leys, MD;  Location: [facility] ORS;  Service: Orthopedics;   Laterality: Left;   EYE SURGERY     laser   TRANSURETHRAL RESECTION OF PROSTATE N/A 08/02/2017   Procedure: TRANSURETHRAL RESECTION OF THE PROSTATE (TURP);  Surgeon: Hollice Espy, MD;  Location: [facility] ORS;  Service: Urology;  Laterality: N/A;    Family History  Problem Relation Age of Onset   Cancer Mother        lung   Heart disease Father    Diabetes Brother    Prostate cancer Neg Hx    Bladder Cancer Neg Hx    Kidney cancer Neg Hx     Social History   Socioeconomic History   Marital status: Married    Spouse name: Not on file   Number of children: Not on file   Years of education: Not on file   Highest education level: High school graduate  Occupational History   Occupation: retired  Tobacco Use   Smoking status: Never   Smokeless tobacco: Never  Vaping Use   Vaping Use: Never used  Substance and Sexual Activity   Alcohol use: No    Alcohol/week: 0.0 standard drinks   Drug use: No   Sexual activity: Yes  Other Topics Concern   Not on file  Social History Narrative   Not on file   Social Determinants of Health   Financial Resource Strain: Not on file  Food Insecurity: No Food Insecurity   Worried About Running Out of Food in the Last Year: Never true   [place] in the Last Year: Never true  Transportation Needs: No Transportation Needs   Lack of Transportation (Medical): No   Lack of Transportation (Non-Medical): No  Physical Activity: Not on file  Stress: Not on file  Social Connections: Not on file  Intimate Partner Violence: Not on file    Outpatient Medications Prior to Visit  Medication Sig Dispense Refill   albuterol (VENTOLIN HFA) 108 (90 Base) MCG/ACT inhaler Inhale 2 puffs into the lungs every 6 (six) hours as needed for wheezing or shortness of breath. 18 g 4   aspirin EC 81 MG tablet Take 81 mg by mouth daily. Swallow whole.     atorvastatin (LIPITOR) 80 MG tablet TAKE 1 TABLET EVERY DAY 90 tablet 0   fluticasone (FLONASE) 50 MCG/ACT  nasal spray Place 2 sprays into both nostrils daily as needed.      insulin glargine (LANTUS) 100 UNIT/ML injection Inject 0.35 mLs (35 Units total) into the skin every evening. 1800 10 mL 11   metFORMIN (GLUCOPHAGE) 500 MG tablet Take 2 tablets (1,000 mg total) by  mouth 2 (two) times daily with a meal. 1023m in the morning 10038mat night 360 tablet 4   Omega-3 Fatty Acids (OMEGA 3 500 PO) Take 500 mg by mouth daily.     omeprazole (PRILOSEC) 20 MG capsule Take 1 capsule (20 mg total) by mouth daily. 90 capsule 0   tamsulosin (FLOMAX) 0.4 MG CAPS capsule TAKE 1 CAPSULE EVERY DAY 90 capsule 2   TRUE METRIX BLOOD GLUCOSE TEST test strip CHECK BLOOD SUGAR TWICE DAILY 200 strip 3   TRUEplus Lancets 33G MISC CHECK BLOOD SUGAR TWICE DAILY 200 each 1   insulin lispro (HUMALOG KWIKPEN) 100 UNIT/ML KwikPen Inject 15 Units into the skin 3 (three) times daily before meals. Continue to check blood sugar after meals. (Patient not taking: No sig reported) 15 mL 5   midodrine (PROAMATINE) 10 MG tablet Take 1 tablet (10 mg total) by mouth 3 (three) times daily with meals. (Patient not taking: No sig reported) 90 tablet 1   predniSONE (DELTASONE) 20 MG tablet Take 3 tablets once daily for 3 days followed by 2 tablets once daily for 3 days followed by 1 tablet once daily for 3 days and then stop (Patient not taking: No sig reported) 18 tablet 0   No facility-administered medications prior to visit.    Allergies  Allergen Reactions   Gabapentin Other (See Comments)    "made me fall out"    ROS Review of Systems  Constitutional:  Positive for fatigue.  HENT: Negative.    Respiratory: Negative.    Cardiovascular: Negative.   Gastrointestinal: Negative.   Genitourinary: Negative.   Musculoskeletal: Negative.   Skin: Negative.   Neurological:  Positive for weakness (fall 3 days ago).  Psychiatric/Behavioral: Negative.       Objective:    Physical Exam Vitals and nursing note reviewed.   Constitutional:      Appearance: Normal appearance.  HENT:     Head: Normocephalic.  Eyes:     Conjunctiva/sclera: Conjunctivae normal.  Cardiovascular:     Rate and Rhythm: Normal rate and regular rhythm.     Pulses: Normal pulses.     Heart sounds: Normal heart sounds.  Pulmonary:     Effort: Pulmonary effort is normal.     Breath sounds: Normal breath sounds.  Abdominal:     Palpations: Abdomen is soft.     Tenderness: There is no abdominal tenderness.  Musculoskeletal:     Cervical back: Normal range of motion.     Right lower leg: No edema.     Left lower leg: No edema.  Skin:    General: Skin is warm and dry.  Neurological:     General: No focal deficit present.     Mental Status: He is alert. He is disoriented.  Psychiatric:        Mood and Affect: Mood normal.        Behavior: Behavior normal.        Judgment: Judgment normal.   MMSE - Mini Mental State Exam 03/13/2021  Orientation to time 4  Orientation to Place 2  Registration 2  Attention/ Calculation 2  Recall 2  Language- name 2 objects 2  Language- repeat 1  Language- follow 3 step command 3  Language- read & follow direction 1  Write a sentence 0  Copy design 1  Total score 20     BP (!) 88/55   Pulse 80   Temp 98.5 F (36.9 C) (Oral)   Wt 171 lb (  77.6 kg)   SpO2 95%   BMI 25.25 kg/m  Wt Readings from Last 3 Encounters:  03/13/21 171 lb (77.6 kg)  02/28/21 171 lb 15.3 oz (78 kg)  02/04/21 173 lb 12.8 oz (78.8 kg)     Health Maintenance Due  Topic Date Due   OPHTHALMOLOGY EXAM  09/28/2018   COVID-19 Vaccine (3 - Pfizer risk series) 09/24/2020   INFLUENZA VACCINE  01/13/2021    There are no preventive care reminders to display for this patient.  Lab Results  Component Value Date   TSH 2.620 02/04/2021   Lab Results  Component Value Date   WBC 9.0 03/03/2021   HGB 11.2 (L) 03/03/2021   HCT 33.8 (L) 03/03/2021   MCV 92.9 03/03/2021   PLT 178 03/03/2021   Lab Results   Component Value Date   NA 136 03/04/2021   K 3.7 03/04/2021   CO2 25 03/04/2021   GLUCOSE 306 (H) 03/04/2021   BUN 24 (H) 03/04/2021   CREATININE 1.06 03/04/2021   BILITOT 1.5 (H) 02/28/2021   ALKPHOS 98 02/28/2021   AST 15 02/28/2021   ALT 11 02/28/2021   PROT 6.9 02/28/2021   ALBUMIN 3.8 02/28/2021   CALCIUM 8.7 (L) 03/04/2021   ANIONGAP 8 03/04/2021   EGFR 47 (L) 02/04/2021   Lab Results  Component Value Date   CHOL 173 02/04/2021   Lab Results  Component Value Date   HDL 33 (L) 02/04/2021   Lab Results  Component Value Date   LDLCALC 78 02/04/2021   Lab Results  Component Value Date   TRIG 388 (H) 02/04/2021   No results found for: CHOLHDL Lab Results  Component Value Date   HGBA1C >15.5 (H) 03/02/2021      Assessment & Plan:   Problem List Items Addressed This Visit       Cardiovascular and Mediastinum   Essential hypertension    Blood pressure low today 88/55. He was unable to pick up the midodrine at the pharmacy as there was a cost issue. We called the [place] and spoke to [place] about getting medications sent through [place]. She states she will give the note and information to Dr. Meyer Russel to help follow up with medications needed.       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 Continue oxygen at home. Number given for [facility] pulmonology who has set up the oxygen to his granddaughter to call with any questions or concerns.         Endocrine   Uncontrolled type 2 diabetes mellitus with chronic kidney disease ([place]) - Primary    Recently in hospital on insulin drip. He was able to pick up his long acting insulin, however couldn't afford his short acting insulin. He also doesn't have a working glucometer at home. We called the [place] and spoke to [place] about getting medications sent through [place]. She states she will give the note and information to Dr.  Meyer Russel to help follow up with medications needed.        Relevant Orders   Basic Metabolic Panel (BMET)   Ambulatory referral to Home Health     Genitourinary   Chronic kidney disease, stage 3a ([place])    Chronic, acute kidney injury in hospital, however improved with fluids. Will check CMP and CBC today.       Relevant Orders  Basic Metabolic Panel (BMET)     Other   Fall    Multiple falls at home recently, the last one 3 days ago. Will set up [facility] RN, PT, OT, and aide. Will order rolling walker and bedside commode and help get resources on installing grab rails in the shower.       Relevant Orders   AMB Referral to [place]   DME Bedside commode   For home use only DME 4 wheeled rolling walker with seat ([id number])   Ambulatory referral to Home Health   Memory impairment    Slowly seeing decline in cognition over the past few months. Has missed several appointments. MMSE score was 20 today. He is currently living with his granddaughter who is helping to care for him around the clock. Will place referral to neurology. Follow up with PCP in 2 weeks.       Relevant Orders   Ambulatory referral to Neurology   Ambulatory referral to [place]   Other Visit Diagnoses     Hospital discharge follow-up       Weakness       Relevant Orders   AMB Referral to [place]   DME Bedside commode   For home use only DME 4 wheeled rolling walker with seat ([id number])   Ambulatory referral to [place]       No orders of the defined types were placed in this encounter.   Follow-up: No follow-ups on file.   A total of 40 minutes were spent on this encounter today. When total time is documented, this includes both the face-to-face and non-face-to-face time personally spent before, during and after the visit on the date of the encounter.    Charyl Dancer, NP

## 2021-03-14 ENCOUNTER — Telehealth: Payer: Medicare Other

## 2021-03-14 ENCOUNTER — Telehealth: Payer: Self-pay

## 2021-03-14 ENCOUNTER — Telehealth: Payer: Self-pay | Admitting: Licensed Clinical Social Worker

## 2021-03-14 DIAGNOSIS — I1310 Hypertensive heart and chronic kidney disease without heart failure, with stage 1 through stage 4 chronic kidney disease, or unspecified chronic kidney disease: Secondary | ICD-10-CM | POA: Diagnosis not present

## 2021-03-14 DIAGNOSIS — E785 Hyperlipidemia, unspecified: Secondary | ICD-10-CM

## 2021-03-14 DIAGNOSIS — J432 Centrilobular emphysema: Secondary | ICD-10-CM

## 2021-03-14 DIAGNOSIS — E1165 Type 2 diabetes mellitus with hyperglycemia: Secondary | ICD-10-CM

## 2021-03-14 DIAGNOSIS — N1831 Chronic kidney disease, stage 3a: Secondary | ICD-10-CM | POA: Diagnosis not present

## 2021-03-14 DIAGNOSIS — E1169 Type 2 diabetes mellitus with other specified complication: Secondary | ICD-10-CM

## 2021-03-14 DIAGNOSIS — I10 Essential (primary) hypertension: Secondary | ICD-10-CM | POA: Diagnosis not present

## 2021-03-14 DIAGNOSIS — E1122 Type 2 diabetes mellitus with diabetic chronic kidney disease: Secondary | ICD-10-CM | POA: Diagnosis not present

## 2021-03-14 DIAGNOSIS — E782 Mixed hyperlipidemia: Secondary | ICD-10-CM

## 2021-03-14 DIAGNOSIS — I5032 Chronic diastolic (congestive) heart failure: Secondary | ICD-10-CM

## 2021-03-14 LAB — CBC WITH DIFFERENTIAL/PLATELET
Basophils Absolute: 0 10*3/uL (ref 0.0–0.2)
Basos: 0 %
EOS (ABSOLUTE): 0 10*3/uL (ref 0.0–0.4)
Eos: 0 %
Hematocrit: 44 % (ref 37.5–51.0)
Hemoglobin: 14.2 g/dL (ref 13.0–17.7)
Immature Grans (Abs): 0.1 10*3/uL (ref 0.0–0.1)
Immature Granulocytes: 1 %
Lymphocytes Absolute: 4.2 10*3/uL — ABNORMAL HIGH (ref 0.7–3.1)
Lymphs: 24 %
MCH: 30.1 pg (ref 26.6–33.0)
MCHC: 32.3 g/dL (ref 31.5–35.7)
MCV: 93 fL (ref 79–97)
Monocytes Absolute: 0.9 10*3/uL (ref 0.1–0.9)
Monocytes: 5 %
Neutrophils Absolute: 12.5 10*3/uL — ABNORMAL HIGH (ref 1.4–7.0)
Neutrophils: 70 %
Platelets: 284 10*3/uL (ref 150–450)
RBC: 4.72 x10E6/uL (ref 4.14–5.80)
RDW: 12.2 % (ref 11.6–15.4)
WBC: 17.8 10*3/uL — ABNORMAL HIGH (ref 3.4–10.8)

## 2021-03-14 LAB — BASIC METABOLIC PANEL
BUN/Creatinine Ratio: 27 — ABNORMAL HIGH (ref 10–24)
BUN: 28 mg/dL — ABNORMAL HIGH (ref 8–27)
CO2: 23 mmol/L (ref 20–29)
Calcium: 9.4 mg/dL (ref 8.6–10.2)
Chloride: 98 mmol/L (ref 96–106)
Creatinine, Ser: 1.02 mg/dL (ref 0.76–1.27)
Glucose: 343 mg/dL — ABNORMAL HIGH (ref 70–99)
Potassium: 4.1 mmol/L (ref 3.5–5.2)
Sodium: 138 mmol/L (ref 134–144)
eGFR: 75 mL/min/{1.73_m2} (ref >59)

## 2021-03-14 NOTE — Telephone Encounter (Signed)
-----   Message from Charyl Dancer, NP sent at 03/14/2021  8:21 AM EDT ----- Please let Scott Lawson or Scott Lawson (his granddaughter) know that his kidneys and blood counts are normal. His sugar is elevated at 343, which the [place] should be sending his short acting insulin very soon. His white blood count is elevated, but this could be from taking prednisone and steroids in the hospital. We will recheck this at his next visit.

## 2021-03-14 NOTE — Telephone Encounter (Signed)
Spoke with [name] and informed her of results and recommendations.

## 2021-03-14 NOTE — Telephone Encounter (Signed)
    Clinical Social Work  Chronic Care Management   Phone Outreach    03/14/2021 Name: Scott Lawson. MRN: [id number] DOB: 06/17/1940  Scott Lawson. is a 80 y.o. year old male who is a primary care patient of Cannady, Barbaraann Faster, NP .   Reason for referral: Level of Care Concerns and Caregiver Stress.    CCM LCSW reached out to patient's granddaughter today by phone to assess needs and offer Care Management services and interventions. Telephone outreach was unsuccessful. Unable to leave a HIPPA compliant phone message due to voice mail not set up.  Plan:CCM LCSW will wait for return call. If no return call is received, the appointment will be rescheduled with CCM LCSW  Review of patient status, including review of consultants reports, relevant laboratory and other test results, and collaboration with appropriate care team members and the patient's provider was performed as part of comprehensive patient evaluation and provision of care management services.     Christa See, MSW, [email or web address] Phone [phone] 12:29 PM

## 2021-03-18 ENCOUNTER — Telehealth: Payer: Self-pay

## 2021-03-18 ENCOUNTER — Telehealth: Payer: Medicare Other

## 2021-03-18 NOTE — Telephone Encounter (Signed)
  Care Management   Follow Up Note   03/18/2021 Name: Scott Lawson [name] MRN: [id number] DOB: 03-16-1941   Referred by: Venita Lick, NP Reason for referral : Chronic Care Management (RNCM; Follow up for Chronic Disease Management and Care Coordination Needs)   An unsuccessful telephone outreach was attempted today. The patient was referred to the case management team for assistance with care management and care coordination.   Follow Up Plan: The care management team will reach out to the patient again over the next 30 days.   Noreene Larsson RN, MSN, [facility] Mobile: [phone]

## 2021-03-20 ENCOUNTER — Telehealth: Payer: Medicare Other

## 2021-03-20 ENCOUNTER — Telehealth: Payer: Self-pay | Admitting: *Deleted

## 2021-03-20 NOTE — Chronic Care Management (AMB) (Signed)
  Care Management   Note  03/20/2021 Name: Maxxwell Edgett. MRN: [id number] DOB: 07/16/1940  Princess Bruins Ayars [name] is a 80 y.o. year old male who is a primary care patient of Venita Lick, NP and is actively engaged with the care management team. I reached out to [name] by phone today to assist with re-scheduling an initial visit with the Licensed Clinical Social Worker  Follow up plan: Unsuccessful telephone outreach attempt made. A HIPAA compliant phone message was left for the patient providing contact information and requesting a return call.  The care management team will reach out to the patient again over the next 7 days.  If patient returns call to provider office, please advise to call [place] at [phone].  [place] Management  Direct Dial: [phone]

## 2021-03-27 NOTE — Chronic Care Management (AMB) (Signed)
  Care Management   Note  03/27/2021 Name: Scott Lawson. MRN: [id number] DOB: 1941-02-11  Scott Lawson. is a 80 y.o. year old male who is a primary care patient of Venita Lick, NP and is actively engaged with the care management team. I reached out to [name] by phone today to assist with re-scheduling an initial visit with the Licensed Clinical Social Worker  Follow up plan: Telephone appointment with care management team member scheduled for:04/07/21  [place] Management  Direct Dial: [phone]

## 2021-03-28 DIAGNOSIS — J479 Bronchiectasis, uncomplicated: Secondary | ICD-10-CM | POA: Diagnosis not present

## 2021-03-28 NOTE — Chronic Care Management (AMB) (Signed)
  Care Management   Note  03/28/2021 Name: [name]. MRN: [id number] DOB: 1940/10/31  Scott Lawson. is a 80 y.o. year old male who is a primary care patient of Venita Lick, NP and is actively engaged with the care management team. I reached out to [name] by phone today to assist with scheduling an initial visit with the Pharmacist and reschedule f/u with RN CM   Follow up plan: Unsuccessful telephone outreach attempt made. The care management team will reach out to the patient again over the next 7 days.  If patient returns call to provider office, please advise to call [facility]  at [place], [address], [place], [place] [address] Direct Dial: [phone] [email or web address]: [email or web address]

## 2021-03-29 ENCOUNTER — Encounter: Payer: Self-pay | Admitting: Nurse Practitioner

## 2021-03-29 DIAGNOSIS — U071 COVID-19: Secondary | ICD-10-CM | POA: Diagnosis not present

## 2021-03-29 DIAGNOSIS — N183 Chronic kidney disease, stage 3 unspecified: Secondary | ICD-10-CM | POA: Diagnosis not present

## 2021-03-31 ENCOUNTER — Ambulatory Visit: Payer: Medicare Other | Admitting: Nurse Practitioner

## 2021-03-31 ENCOUNTER — Encounter (HOSPITAL_COMMUNITY): Payer: Self-pay | Admitting: Radiology

## 2021-03-31 DIAGNOSIS — N1831 Chronic kidney disease, stage 3a: Secondary | ICD-10-CM

## 2021-03-31 DIAGNOSIS — C61 Malignant neoplasm of prostate: Secondary | ICD-10-CM

## 2021-03-31 DIAGNOSIS — E1169 Type 2 diabetes mellitus with other specified complication: Secondary | ICD-10-CM

## 2021-03-31 DIAGNOSIS — E1100 Type 2 diabetes mellitus with hyperosmolarity without nonketotic hyperglycemic-hyperosmolar coma (NKHHC): Secondary | ICD-10-CM

## 2021-03-31 DIAGNOSIS — E538 Deficiency of other specified B group vitamins: Secondary | ICD-10-CM

## 2021-04-01 ENCOUNTER — Telehealth: Payer: Self-pay | Admitting: *Deleted

## 2021-04-01 NOTE — Telephone Encounter (Signed)
   Telephone encounter was:  Successful.  04/01/2021 Name: Scott Lawson. MRN: [id number] DOB: 1940-07-04  Scott Lawson. is a 80 y.o. year old male who is a primary care patient of Cannady, Barbaraann Faster, NP . The community resource team was consulted for assistance with Home Modifications  Care guide performed the following interventions: Patient provided with information about care guide support team and interviewed to confirm resource needs Discussed resources to assist with handrails .  Follow Up Plan:  Care guide will follow up with patient by phone over the next days  [place], Care Management  [phone] [address] , [place] [address] Email : [name]

## 2021-04-02 ENCOUNTER — Telehealth: Payer: Self-pay

## 2021-04-02 NOTE — Chronic Care Management (AMB) (Signed)
Chronic Care Management Pharmacy Assistant   Name: Crews Mccollam.  MRN: [id number] DOB: Oct 13, 1940   Reason for Encounter: Disease State General adherence     Recent office visits:  03/13/21 Charyl Dancer NP - Seen for diabetes - Labs ordered - Referral to neurology - Referral to home health - NO medication changes noted - Follow up with PCP in 2 weeks 02/04/21 Venita Lick NP - Seen for New onset seizure - Labs ordered -  stop Lisinopril 5 MG at this time and adjust plan as needed - Referral to Palliative Care - referral to Pulmonology -  referral to endocrinology - increase Lantus to 35 units daily -  Increase Humalog to 15 units before meals - Follow up after hospitalization  11/26/20 Venita Lick NP - Seen for diabetes - Labs ordered - increase Lantus to 25 units daily and Metformin 1000 MG BID -  Increase Humalog to 10 units before meals - Follow up in 4 weeks    Recent consult visits:  None noted   Hospital visits:   Admitted to the hospital on 02/04/21 due to acute kidney injury. Discharge date was 02/05/21. Discharged from [place] med list from [facility]   Admitted to the hospital on 02/28/21 due to hypergycemia. Discharge date was 03/04/21. Discharged from [place]?Medications Started at [facility] Discharge:?? -started  azithromycin (ZITHROMAX) 500 mg tablet, take 1 tablet for 2 days cefdinir (OMNICEF) 300 mg capsule, Take 1 capsule every 12 hours for 3 days  midodrine (PROAMATINE) 10 mg, take one tablet 3 times daily predniSONE (DELTASONE) 20 mg tablet, Take 3 tablets once daily for 3 days followed by 2 tablets once daily for 3 days followed by 1 tablet daily for 3 days and then stop    Medication Changes at Hospital Discharge: N/a  Medications Discontinued at Hospital Discharge: -Stopped lisinopril 5 mg   Medications that remain the same after Hospital Discharge:??  -All  other medications will remain the same.    Medications: Outpatient Encounter Medications as of 04/02/2021  Medication Sig   albuterol (VENTOLIN HFA) 108 (90 Base) MCG/ACT inhaler Inhale 2 puffs into the lungs every 6 (six) hours as needed for wheezing or shortness of breath.   aspirin EC 81 MG tablet Take 81 mg by mouth daily. Swallow whole.   atorvastatin (LIPITOR) 80 MG tablet TAKE 1 TABLET EVERY DAY   fluticasone (FLONASE) 50 MCG/ACT nasal spray Place 2 sprays into both nostrils daily as needed.    insulin glargine (LANTUS) 100 UNIT/ML injection Inject 0.35 mLs (35 Units total) into the skin every evening. 1800   insulin lispro (HUMALOG KWIKPEN) 100 UNIT/ML KwikPen Inject 15 Units into the skin 3 (three) times daily before meals. Continue to check blood sugar after meals.   metFORMIN (GLUCOPHAGE) 500 MG tablet Take 2 tablets (1,000 mg total) by mouth 2 (two) times daily with a meal. 1000mg  in the morning 1000mg  at night   midodrine (PROAMATINE) 10 MG tablet Take 1 tablet (10 mg total) by mouth 3 (three) times daily with meals.   Omega-3 Fatty Acids (OMEGA 3 500 PO) Take 500 mg by mouth daily.   omeprazole (PRILOSEC) 20 MG capsule Take 1 capsule (20 mg total) by mouth daily.   tamsulosin (FLOMAX) 0.4 MG CAPS capsule TAKE 1 CAPSULE EVERY DAY   TRUE METRIX BLOOD GLUCOSE TEST test strip CHECK BLOOD SUGAR TWICE DAILY   TRUEplus  Lancets 33G MISC CHECK BLOOD SUGAR TWICE DAILY   No facility-administered encounter medications on file as of 04/02/2021.   Have you had any problems recently with your health? Have you had any problems with your pharmacy? What issues or side effects are you having with your medications? What would you like me to pass along to Edison Nasuti Potts,CPP for them to help you with?  What can we do to take care of you better?  Attempted to reach the patient a total of 4 times. I was able to reach his granddaughter Gari Crown who stated that she was not currently home and to call back  in 10 minutes. I called back and received no answer. Unable to complete an assessment call.   Care Gaps: OPHTHALMOLOGY EXAM Overdue since 09/28/2018    Star Rating Drugs: atorvastatin (LIPITOR) 80 MG tablet 12/26/20 90 DS  insulin glargine (LANTUS) 100 UNIT/ML injection 03/06/2021 28 DS insulin lispro (HUMALOG KWIKPEN) 100 UNIT/ML KwikPen 02/05/21 metFORMIN (GLUCOPHAGE) 500 MG tablet 10/17/2020 DS   Andee Poles, CMA

## 2021-04-07 ENCOUNTER — Telehealth: Payer: Medicare Other

## 2021-04-09 ENCOUNTER — Other Ambulatory Visit: Payer: Self-pay

## 2021-04-09 ENCOUNTER — Telehealth: Payer: Self-pay | Admitting: Licensed Clinical Social Worker

## 2021-04-09 ENCOUNTER — Other Ambulatory Visit: Payer: Medicare Other | Admitting: Primary Care

## 2021-04-09 ENCOUNTER — Other Ambulatory Visit: Payer: Medicare Other

## 2021-04-09 DIAGNOSIS — Z515 Encounter for palliative care: Secondary | ICD-10-CM

## 2021-04-09 DIAGNOSIS — I5032 Chronic diastolic (congestive) heart failure: Secondary | ICD-10-CM | POA: Diagnosis not present

## 2021-04-09 DIAGNOSIS — R413 Other amnesia: Secondary | ICD-10-CM | POA: Diagnosis not present

## 2021-04-09 DIAGNOSIS — Z794 Long term (current) use of insulin: Secondary | ICD-10-CM

## 2021-04-09 NOTE — Progress Notes (Signed)
[facility] [facility] Palliative Care Consult Note Telephone: [phone]  Fax: [phone]    Date of encounter: 04/09/21 3:15 pm  PATIENT NAME: Scott Lawson. [address] [place] [address]   [phone] (home)  DOB: [birth year] MRN: [id number] PRIMARY CARE PROVIDER:    Venita Lick, NP,  [place] [address] [phone]  REFERRING PROVIDER:   Venita Lick, NP [address] [place],  [place] [address] [phone]  RESPONSIBLE PARTY:    Contact Information     Name Relation Home Work [place] Daughter [phone]  [phone]   Scott Lawson Lawson   [phone]   Scott Lawson Daughter [phone]  [phone]   Scott Lawson Lawson   [phone]        I met face to face with patient and family in  home. Palliative Care was asked to follow this patient by consultation request of  Scott Lick, NP to address advance care planning and complex medical decision making. This is a follow up visit.                                   ASSESSMENT AND PLAN / RECOMMENDATIONS:   Advance Care Planning/Goals of Care: Goals include to maximize quality of life and symptom management. Our advance care planning conversation included a discussion about:    The value and importance of advance care planning  Experiences with loved ones who have been seriously ill or have died - pt wife Exploration of personal, cultural or spiritual beliefs that might influence medical decisions  Exploration of goals of care in the event of a sudden injury or illness  Identification of a healthcare agent - pt does not know who it is, may be daughter Scott Lawson Review of an  advance directive document- Discussed. POA should be identified to be involved in process. CODE STATUS: Full code MOST form and 5 wishes left. Patient is going to review with family. I spent 20 minutes providing this consultation. More than  50% of the time in this consultation was spent in counseling and care coordination. --------------------------------------------------------------------------------------------------------  Symptom Management/Plan:  I saw a patient today at his Lawson's home where he's been living for several months. Patient was alert and oriented and let SW and me into the home. We chatted for a bit and he repeatedly told us he was doing well.  Glucose management: He still does not have any kind of glucose meter in the home. They have obtained the meal time aspart fortunately, and we reviewed his dosing with his great Lawson, who is helping him with the injections. He has lantus 35 units at hs as well. He is missing some of the meal time aspart 10 units. He has eaten for the first time today about 2 hours ago. He is taking metformin. Education provided.  Glucose on exam today was 421 mg/ dl on POS  finger stick meter.  Labs attempted per venipuncture but I was unable to obtain.  HTN: His blood pressure was elevated 150/100. He endorsed it was "all the pretty women" and I administered 10 mg of lisinopril, which he had in the home. I urged him to get to his primary provider for medication review as soon as possible. Current meds in his possession are Flomax, aspirin, metformin and he endorses an albuterol inhaler, although it was not produced. He has midodrine on his med  list , which is not in the home, and nothing for HTN other than lisinorpil 5 mg. This should be assessed by PCP asap. I provided education RE the urgency of getting to his pcp for htn and glucose management.   We called and made appointment for 18 November but I am encouraging them to walk in tomorrow and hoping that his provider or another person there  can see him. I wasn't able to obtain labs which he will also need. I have made PCP aware of this request.   Caregiving: Visit made with [facility] SW Scott Lawson  to discuss caregiving  as well as guardianship. She was able to outline benefits of guardianship both for  care decisions  and his finances. We discussed the fact that patient is not able to make decisions due to lack of capacity from, in all likelihood,  vascular dementia. She would like to provide care but also needs his resources in order to care for him, as he cannot be left for long, and will provide care in lieu of seeking a job. He needs medical oversight for his daily care.   We discussed various family caregiver reimbursement programs such as  Medicaid, and that his asset situation would best  be reviewed by social services and Medicaid.  Information was given about dementia, behaviors and judgment capacity. Scott Lawson, stated she felt bad because she thought he was being purposefully obtuse but now understands his limitations better. We discussed while his language and mobility were  intact, his judgment was not and that he would not be able to make good healthcare decisions or oversee his insulin  and blood sugar management. We discussed the fact that he needs a higher level of care and while she is able to provide it with education, that it is a taxing role. She endorses that she does want to provide care.   I instructed them to present at his PCP tomorrow if he would be willing to go, at least for some labs and hopefully an assessment of his medications. Perhaps he can get some low cost [facility]s [facility]) and a med box. Family endorsed having one but states it needs cleaning, and  they also were not able to make an appointment last month and pick up a glucose monitor from PCP. Lawson states she will try to get him to go out tomorrow and do errands but that sometimes he is reticent. She does have a new understanding of his limitations now after our discussion about moderate to advanced dementia.    Follow up Palliative Care Visit: Palliative care will continue to follow for complex medical  decision making, advance care planning, and clarification of goals. Return 2-4 weeks or prn.  This visit was coded based on medical decision making (MDM).  PPS: 50%  HOSPICE ELIGIBILITY/DIAGNOSIS: TBD  Chief Complaint: hypertension, hyperglycemia  HISTORY OF PRESENT ILLNESS:  Scott H Turnage Jr. is a 80 y.o. year old male  with DM, insulin dependent, HTN, dementia. Presents today with bg of 421, and poor historian. Family endorses they have his meal time insulin but he needs help with administration. They are not always aware of his eating and his dementia is progressing so he cannot report. He also has elevated BP today, and needs meds reviewed and reordered. [facility] for f/u.   History obtained from review of EMR, discussion with primary team, and interview with family, facility staff/caregiver and/or   Scott Lawson.  I reviewed available labs, medications, imaging, studies and related documents from the EMR.  Records reviewed and summarized above.   ROS  General: NAD ENMT: denies dysphagia Cardiovascular: denies chest pain, denies DOE Pulmonary: denies cough, denies increased SOB Abdomen: endorses good appetite, denies constipation, endorses continence of bowel GU: denies dysuria, endorses continence of urine MSK:  denies weakness,  no falls reported recently Skin: denies rashes or wounds Neurological: denies pain, denies insomnia Psych: Endorses positive mood Heme/lymph/immuno: denies bruises, abnormal bleeding  Physical Exam: Current and past weights: unavailable Constitutional: NAD, 160/100 HR 80, RR 18 General: frail appearing, wnwd EYES: anicteric sclera, lids intact, no discharge  ENMT: intact hearing, oral mucous membranes moist, dentition intact CV:  no LE edema Pulmonary: no increased work of breathing, no cough, room air Abdomen: intake 100%,  no ascites GU: deferred MSK: mild  sarcopenia, moves all extremities, ambulatory Skin:  warm and dry, no rashes or wounds on visible skin Neuro:  moderate  generalized weakness,  + cognitive impairment Psych: non-anxious affect, A and O x 2-3, forgetful Hem/lymph/immuno: no widespread bruising  Thank you for the opportunity to participate in the care of Scott Lawson.  The palliative care team will continue to follow. Please call our office at [phone] if we can be of additional assistance.   Jason Coop, NP , DNP, [facility]  COVID-19 PATIENT SCREENING TOOL Asked and negative response unless otherwise noted:   Have you had symptoms of covid, tested positive or been in contact with someone with symptoms/positive test in the past 5-10 days?

## 2021-04-09 NOTE — Progress Notes (Signed)
COMMUNITY PALLIATIVE CARE SW NOTE  PATIENT NAME: Scott Lawson. DOB: [birth year] MRN: [id number]  PRIMARY CARE PROVIDER: Venita Lick, NP  RESPONSIBLE PARTY:  Acct ID - Guarantor Home Phone Work Phone Relationship Acct Type  [id number] - Ebbert,WIL* [phone]  Self P/F     [address] [place], [place] [address]     PLAN OF CARE and INTERVENTIONS:             GOALS OF CARE/ ADVANCE CARE PLANNING:  Patient is a Full code, as there are no ACP documents on file. Advance care planning is ongoing discussion, documents left in the home. Patient share he has POA forms, but not able to supply them at this time or state hwo is identified as his attorney in fact. Patient's goals include to maximize quality of life and symptom management and avoid hospitalizations.  2.         SOCIAL/EMOTIONAL/SPIRITUAL ASSESSMENT/ INTERVENTIONS:  Follow up in home PC visit made with SW and PC NP. Patient resides with granddaughter and great granddaughter Scott Lawson, where he has been residing for the past few months. Granddaughters home is one story with no steps to entry.   Patient and great granddaughter updated PC on medical update/changes, granddaughter present at end of visit. Scott Lawson shares that she has been assisting patient with blood sugars and providing insulin, however they do not have a reader in the home, patient shares he has a reader at his home, PC encouraged Scott Lawson to retrieve this from patients home.   Patient recently missed his PCP appointment, due to having an appointment at the [facility] on the same day. SW rescheduled PCP appointment during visit for 11/18 @11am , appointment info provided to [facility].    Patient shares his appetite is good. Patient eats meals and snacks that he should not and in return raises his blood sugars.    NP reviewed medications. Patient did not have most medications present, and some were inaccurate. Patient and family encouraged to utilize a pill box and take current  medications to PCP appointment for review. Patients BP read high during visit but did not have the prescribed BP medication in the home to take for this symptom. Patients BS also read high during visit. NP educated patient and family on symptom management and importance of having/giving the correct medications.   Psychosocial and safety assessment completed. Patient has adequate food, running water and electricity at current residence. Disease progression and education continue to be needed/provided to family. No transportation needs identified. Patient is able to ambulate around home, patient has SPC if needed. Continued disease management and medication assistance/education to be provided. Patient can benefit from [facility] RN to obtain assistance with managing high BS and high BP.  Patient's granddaughter express caregiver stress in caring for patient. Dementia disease education provided to granddaughter to include the lack of adequate judgement and insight on previously known task to patient. Granddaughter expressed some frustration with patient's resistance behaviors and noncompliance at times, but stated understanding on disease progression. POA and guardianship options also discussed with family as they are unsure at this time if another family has POA of patient.   Additional in home support discussed briefly, patient may not qualify for MCD at this time due owning land/property. Patient is [facility] connected and could qualify from assistance from the [place].   SW discussed goals, reviewed care plan, provided emotional support, used active and reflective listening with family. PC will continue to monitor.     3.  PATIENT/CAREGIVER EDUCATION/ COPING:  Patient A&O x2, able to answer simple questions appropriately. Patient in good spirits today. No anxiety or depression present at this time. PHQ 9 not completed this visit.Marland Kitchen   4.         PERSONAL EMERGENCY PLAN:  Patient will call 9-1-1 for emergencies.     5.         COMMUNITY RESOURCES COORDINATION/ HEALTH CARE NAVIGATION:  Patient granddaughter and great granddaughter manages her care due to patients increase cognitive decline and being unable to make safe health decisions on his own.   6.         FINANCIAL/LEGAL CONCERNS/INTERVENTIONS:  Patient could benefit from financial oversight/management due to increase cognitive decline.           SOCIAL HX:  Social History   Tobacco Use   Smoking status: Never   Smokeless tobacco: Never  Substance Use Topics   Alcohol use: No    Alcohol/week: 0.0 standard drinks    CODE STATUS: Full code ADVANCED DIRECTIVES: TBD MOST FORM COMPLETE:  TBD HOSPICE EDUCATION PROVIDED: N  BJY:NWGNFAO is independent with all ADL's, family assist with meal prep and med management. Patient is A&O x2 to with poor insight and judgement.   Time spent: [place], [place]

## 2021-04-09 NOTE — Telephone Encounter (Signed)
    Clinical Social Work  Care Management   Phone Outreach    04/09/2021 Name: Scott Lawson. MRN: [id number] DOB: Aug 21, 1940  Scott Bruins Hopkins [name] is a 80 y.o. year old male who is a primary care patient of Cannady, Barbaraann Faster, NP .   Reason for referral: Level of Care Concerns and Caregiver Stress.    Unable to leave a HIPPA compliant phone message due to voice mail not set up. 2nd unsuccessful telephone outreach attempt.  If unable to reach patient by phone on the 3rd attempt, will discontinue outreach calls but will be available at any time to provide services.   Plan:CCM LCSW will wait for return call. If no return call is received, Will reach out to patient again in the next 14 days .   Review of patient status, including review of consultants reports, relevant laboratory and other test results, and collaboration with appropriate care team members and the patient's provider was performed as part of comprehensive patient evaluation and provision of care management services.     Christa See, [email or web address], [email or web address] Phone [phone] 7:09 AM

## 2021-04-11 ENCOUNTER — Telehealth: Payer: Self-pay | Admitting: *Deleted

## 2021-04-11 NOTE — Telephone Encounter (Signed)
   Telephone encounter was:  Unsuccessful.  04/11/2021 Name: Scott Lawson. MRN: [id number] DOB: 25-Apr-1941  Unsuccessful outbound call made today to assist with:  Home Modifications  Outreach Attempt:  2nd  Mailbox is full  [place], Care Management  [phone] [address] , [place] [address] Email : [name]

## 2021-04-14 ENCOUNTER — Telehealth: Payer: Self-pay | Admitting: *Deleted

## 2021-04-14 NOTE — Telephone Encounter (Signed)
   Telephone encounter was:  Unsuccessful.  04/14/2021 Name: Scott Lawson. MRN: [id number] DOB: Nov 19, 1940  Unsuccessful outbound call made today to assist with:  Transportation Needs  and Food Insecurity  Outreach Attempt:  3rd Attempt.  Referral closed unable to contact patient.   Mailbox is full  [place], Care Management  [phone] [address] , [place] [address] Email : [name]

## 2021-04-17 ENCOUNTER — Ambulatory Visit (INDEPENDENT_AMBULATORY_CARE_PROVIDER_SITE_OTHER): Payer: Medicare Other

## 2021-04-17 ENCOUNTER — Telehealth: Payer: Self-pay

## 2021-04-17 DIAGNOSIS — J432 Centrilobular emphysema: Secondary | ICD-10-CM

## 2021-04-17 DIAGNOSIS — E785 Hyperlipidemia, unspecified: Secondary | ICD-10-CM

## 2021-04-17 DIAGNOSIS — Z9181 History of falling: Secondary | ICD-10-CM

## 2021-04-17 DIAGNOSIS — E1100 Type 2 diabetes mellitus with hyperosmolarity without nonketotic hyperglycemic-hyperosmolar coma (NKHHC): Secondary | ICD-10-CM

## 2021-04-17 DIAGNOSIS — I10 Essential (primary) hypertension: Secondary | ICD-10-CM

## 2021-04-17 DIAGNOSIS — W19XXXS Unspecified fall, sequela: Secondary | ICD-10-CM

## 2021-04-17 DIAGNOSIS — I5032 Chronic diastolic (congestive) heart failure: Secondary | ICD-10-CM

## 2021-04-17 DIAGNOSIS — R413 Other amnesia: Secondary | ICD-10-CM

## 2021-04-17 DIAGNOSIS — E1169 Type 2 diabetes mellitus with other specified complication: Secondary | ICD-10-CM

## 2021-04-17 DIAGNOSIS — J849 Interstitial pulmonary disease, unspecified: Secondary | ICD-10-CM

## 2021-04-17 NOTE — Telephone Encounter (Signed)
  Care Management   Follow Up Note   04/17/2021 Name: Scott Lawson. MRN: [id number] DOB: [birth year]   Referred by: Venita Lick, NP Reason for referral : Chronic Care Management (RNCM: Follow up for Chronic Disease Management and Care Coordination Needs- attempt)   A second unsuccessful telephone outreach was attempted today. The patient was referred to the case management team for assistance with care management and care coordination.   Follow Up Plan: A HIPPA compliant phone message was left for the patient providing contact information and requesting a return call.  Noreene Larsson RN, MSN, [facility] Mobile: [phone]

## 2021-04-17 NOTE — Chronic Care Management (AMB) (Deleted)
Chronic Care Management   CCM RN Visit Note  04/17/2021 Name: Scott Lawson. MRN: [id number] DOB: Mar 13, 1941  Subjective: Scott Bruins Jetter [name] is a 80 y.o. year old male who is a primary care patient of Cannady, Barbaraann Faster, NP. The care management team was consulted for assistance with disease management and care coordination needs.    Engaged with patient by telephone for follow up visit in response to provider referral for case management and/or care coordination services. Spoke to patients granddaughter Scott Lawson after leaving a message asking for a return call.   Consent to Services:  The patient was given information about Chronic Care Management services, agreed to services, and gave verbal consent prior to initiation of services.  Please see initial visit note for detailed documentation.   Patient agreed to services and verbal consent obtained.   Assessment: Review of patient past medical history, allergies, medications, health status, including review of consultants reports, laboratory and other test data, was performed as part of comprehensive evaluation and provision of chronic care management services.   SDOH (Social Determinants of Health) assessments and interventions performed:    CCM Care Plan  Allergies  Allergen Reactions   Gabapentin Other (See Comments)    "made me fall out"    Outpatient Encounter Medications as of 04/17/2021  Medication Sig   albuterol (VENTOLIN HFA) 108 (90 Base) MCG/ACT inhaler Inhale 2 puffs into the lungs every 6 (six) hours as needed for wheezing or shortness of breath.   aspirin EC 81 MG tablet Take 81 mg by mouth daily. Swallow whole.   atorvastatin (LIPITOR) 80 MG tablet TAKE 1 TABLET EVERY DAY (Patient not taking: Reported on 04/09/2021)   fluticasone (FLONASE) 50 MCG/ACT nasal spray Place 2 sprays into both nostrils daily as needed.  (Patient not taking: Reported on 04/09/2021)   insulin glargine (LANTUS) 100 UNIT/ML injection  Inject 0.35 mLs (35 Units total) into the skin every evening. 1800   insulin lispro (HUMALOG KWIKPEN) 100 UNIT/ML KwikPen Inject 15 Units into the skin 3 (three) times daily before meals. Continue to check blood sugar after meals.   metFORMIN (GLUCOPHAGE) 500 MG tablet Take 2 tablets (1,000 mg total) by mouth 2 (two) times daily with a meal. 1073m in the morning 10081mat night   midodrine (PROAMATINE) 10 MG tablet Take 1 tablet (10 mg total) by mouth 3 (three) times daily with meals. (Patient not taking: Reported on 04/09/2021)   Omega-3 Fatty Acids (OMEGA 3 500 PO) Take 500 mg by mouth daily. (Patient not taking: Reported on 04/09/2021)   omeprazole (PRILOSEC) 20 MG capsule Take 1 capsule (20 mg total) by mouth daily. (Patient not taking: Reported on 04/09/2021)   tamsulosin (FLOMAX) 0.4 MG CAPS capsule TAKE 1 CAPSULE EVERY DAY   TRUE METRIX BLOOD GLUCOSE TEST test strip CHECK BLOOD SUGAR TWICE DAILY (Patient not taking: Reported on 04/09/2021)   TRUEplus Lancets 33G MISC CHECK BLOOD SUGAR TWICE DAILY (Patient not taking: Reported on 04/09/2021)   No facility-administered encounter medications on file as of 04/17/2021.    Patient Active Problem List   Diagnosis Date Noted   Memory impairment 03/13/2021   At high risk for falls 03/02/2021   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[facility]08/23/2022   Weight loss, unintentional 02/04/2021   Uncontrolled type 2 diabetes mellitus with hyperosmolar nonketotic hyperglycemia (HCC) 02/04/2021   Chronic diastolic CHF (congestive heart failure) ([facility]08/23/2022   Emphysema lung ([facility]05/14/2022   History of 2019 novel coronavirus disease (COVID-19) 10/26/2020  Interstitial lung disease ([place]) 08/24/2020   Chronic respiratory failure with hypoxia (HCC) 06/20/2020   Atherosclerosis of aorta ([place]) 10/12/2019   Long-term insulin use ([place]) 10/12/2019   Vitamin B12 deficiency 06/28/2018   Prostate cancer ([place]) 06/27/2018   Psoriasis 08/16/2017   Advanced care  planning/counseling discussion 12/01/2016   GERD without esophagitis 08/14/2015   Uncontrolled type 2 diabetes mellitus with chronic kidney disease 08/13/2015   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[place]) 08/13/2015   Chronic kidney disease, stage 3a ([place]) 02/11/2015   Mixed hyperlipidemia due to type 2 diabetes mellitus ([place]) 02/11/2015   BPH (benign prostatic hyperplasia) 02/11/2015    Conditions to be addressed/monitored:HTN, HLD, DMII, Pulmonary Disease, and Falls prevention and safety  Care Plan : RNCM: General Plan of Care (Adult) for Chronic Disease Management and Care Coordination Needs  Updates made by Vanita Ingles, RN since 04/17/2021 12:00 AM     Problem: RNCM: Adult plan of care for Chronic Disease Management and Care Coordination Needs   Priority: High  Onset Date: 03/11/2021     Long-Range Goal: RNCM: Adult plan of care for Chronic Disease Management and Care Coordination Needs   Start Date: 03/11/2021  Expected End Date: 03/11/2022  Recent Progress: On track  Priority: High  Note:   Current Barriers:  Knowledge Deficits related to plan of care for management of HTN, HLD, DMII, Pulmonary Disease, and falls prevention and safety Care Coordination needs related to Limited social support, Level of care concerns, ADL IADL limitations, Family and relationship dysfunction, Social Isolation, Inability to perform IADL's independently, and non-compliance   Chronic Disease Management support and education needs related to HTN, HLD, DMII,  Pulmonary Disease and falls prevention and safety  Lacks caregiver support.  Non-adherence to scheduled provider appointments Non-adherence to prescribed medication regimen  RNCM Clinical Goal(s):  Patient will verbalize understanding of plan for management of HTN, HLD, DMII, Pulmonary Disease, and Falls prevention and safety   verbalize basic understanding of HTN, HLD, DMII, Pulmonary Disease, and falls prevention  and safety  disease process and self health management plan   take all medications exactly as prescribed and will call provider for medication related questions demonstrate understanding of rationale for each prescribed medication 03-11-2021: Granddaughter Scott Lawson states that she and her daughter are assisting the patient with his medications at this time.  attend all scheduled medical appointments: 03-13-2021, reminded the patients granddaughter Scott Lawson of this appointment today, [facility] has an appointment in the home today  demonstrate improved and ongoing adherence to prescribed treatment plan for HTN, HLD, DMII, Pulmonary Disease, and falls risk and safety  as evidenced by daily monitoring and recording of CBG  adherence to ADA/ carb modified diet adherence to prescribed medication regimen contacting provider for new or worsened symptoms or questions compliance with the plan of care for effective management of chronic conditions    demonstrate improved and ongoing health management independence for effective management of Chronic diseases  continue to work with RN Care Manager to address care management and care coordination needs related to HTN, HLD, DMII, Pulmonary Disease, and falls prevention and safety   work with Education officer, museum to address Limited social support, Level of care concerns, ADL IADL limitations, Family and relationship dysfunction, Social Isolation, and noncompliance  related to the management of HTN, HLD, DMII, Pulmonary Disease, and falls prevention and safety  demonstrate a decrease in HTN, HLD, DMII, Pulmonary Disease, and falls prevention and safety  exacerbations  demonstrate ongoing self health care management ability and effective management of chronic condtions   through collaboration with RN Care manager, provider, and care team.   Interventions: 1:1 collaboration with primary care provider regarding development and update of comprehensive plan of  care as evidenced by provider attestation and co-signature Inter-disciplinary care team collaboration (see longitudinal plan of care) Evaluation of current treatment plan related to  self management and patient's adherence to plan as established by provider   SDOH Barriers (Status: Goal on Track (progressing): YES.)  Patient interviewed and SDOH assessment performed        SDOH Interventions    Flowsheet Row Most Recent Value  SDOH Interventions   Food Insecurity Interventions Intervention Not Indicated  Transportation Interventions Intervention Not Indicated     Patient interviewed and appropriate assessments performed Provided patient with information about taking discharge paperwork with the patient to upcoming appointment with the pcp on 03-13-2021. In home visit today with the palliative care services. 04-17-2021: The patient has palliative services coming in. Spoke with granddaughter today. Care guide referral placed for assistance with resources in the community to get railings placed in the bathroom.  Discussed plans with patient for ongoing care management follow up and provided patient with direct contact information for care management team Advised patient to call the office for changes in condition, new questions, or concerns Collaborated with primary care provider re: related to patients health and well being and talking with the patient granddaughter today. The patient is currently living with the granddaughter who states she needs help in the home to help with the patients needs. 04-17-2021: Secure message sent to the pcp and LCSW letting them know that the granddaughter got back in touch with the [facility] today and she has appointments down for LCSW on Monday and will see Jolene in the office on 05-02-2021 with the patient.  Collaborated with [facility]  (community agency) re: concerns and needs of the patient getting the care he needs in his current living  environment and recent hospitalization for uncontrolled blood sugars and hemoglobin A1C of >15.5. 04-17-2021: Per Scott Lawson the patient has his insulin now and she is going to pick up a meter today to be able to check the patients blood sugars Provided education to patient/caregiver regarding level of care options.    Emphysema  (Status: Goal on Track (progressing): YES.) Reviewed medications with patient, including use of prescribed maintenance and rescue inhalers, and provided instruction on medication management and the importance of adherence. 04-17-2021: Scott Lawson states the patient has all his medications and is taking as directed with her and her daughters assistance  Provided patient with basic written and verbal emphysema education on self care/management/and exacerbation prevention; Advised patient to track and manage emphysema triggers;  Provided instruction about proper use of medications used for management of emphysema  including inhalers; Advised patient to self assesses emphysema action plan zone and make appointment with provider if in the yellow zone for 48 hours without improvement; Advised patient to engage in light exercise as tolerated 3-5 days a week to aid in the the management of emphysema  Provided education about and advised patient to utilize infection prevention strategies to reduce risk of respiratory infection; Discussed the importance of adequate rest and management of fatigue with emphysema   Diabetes:  (Status: Goal on track: NO.) Lab Results  Component Value Date   HGBA1C >15.5 (H) 03/02/2021  Assessed patient's understanding of A1c goal: <7% Provided education to patient about basic DM  disease process; Reviewed medications with patient and discussed importance of medication adherence. 04-17-2021: Per Scott Lawson the patient is taking his insulin. He received from the [place]. She can tell a difference in his health.         Reviewed prescribed diet with patient Heart  Healthy/ADA diet. 04-17-2021: The patient sometimes eats more than he should and things he should not. The granddaughter is trying to monitor his intake better and limit his overeating. Counseled on importance of regular laboratory monitoring as prescribed;        Discussed plans with patient for ongoing care management follow up and provided patient with direct contact information for care management team;      Provided patient with written educational materials related to hypo and hyperglycemia and importance of correct treatment. 04-17-2021: Review of sx and sx of hypo and hyperglycemia and what to look for.       Reviewed scheduled/upcoming provider appointments including: 05-02-2021 - reminded the granddaughter Scott Lawson of appointment today. Also the granddaughter knows to expect a call from the LCSW on 04-21-2021 ;         Advised patient, providing education and rationale, to check cbg as directed  and record. 04-17-2021:L Reports still not able to check blood sugars but the granddaughter states she was going to pick up a meter today. Ask her to take blood sugars and record.        call provider for findings outside established parameters;       Referral made to pharmacy team for assistance with help with DM education, medication reconciliation and support and education for medications ;       Referral made to social work team for assistance with family dynamics and level of care concerns. 04-17-2021: Reminded the patients granddaughter of upcoming appointment with the LCSW on Monday;      Review of patient status, including review of consultants reports, relevant laboratory and other test results, and medications completed;       Advised patient to discuss effective management of DM and other chronic conditions with provider;      Screening for signs and symptoms of depression related to chronic disease state;        Assessed social determinant of health barriers;         Falls:  (Status: Goal on  Track (progressing): YES.) Provided written and verbal education re: potential causes of falls and Fall prevention strategies Reviewed medications and discussed potential side effects of medications such as dizziness and frequent urination Advised patient of importance of notifying provider of falls Assessed for signs and symptoms of orthostatic hypotension Assessed for falls since last encounter. 04-17-2021: Scott Lawson states the patient is ambulating better and reports no new falls at this time. Review of safety. Assessed patients knowledge of fall risk prevention secondary to previously provided education Provided patient information for fall alert systems Advised patient to discuss safety concerns with provider  Hyperlipidemia:  (Status: Goal on track: YES.) Lab Results  Component Value Date   CHOL 173 02/04/2021   HDL 33 (L) 02/04/2021   LDLCALC 78 02/04/2021   TRIG 388 (H) 02/04/2021     Medication review performed; medication list updated in electronic medical record.  Provider established cholesterol goals reviewed; Counseled on importance of regular laboratory monitoring as prescribed; Provided HLD educational materials; Reviewed role and benefits of statin for ASCVD risk reduction; Discussed strategies to manage statin-induced myalgias; Reviewed importance of limiting foods high in cholesterol; Reviewed exercise goals and target of  150 minutes per week;  Hypertension: (Status: Goal on track: YES.) Last practice recorded BP readings:  BP Readings from Last 3 Encounters:  03/13/21 (!) 88/55  03/04/21 121/82  02/05/21 115/67  Most recent eGFR/CrCl:  Lab Results  Component Value Date   EGFR 75 03/13/2021    No components found for: CRCL  Evaluation of current treatment plan related to hypertension self management and patient's adherence to plan as established by provider;   Provided education to patient re: stroke prevention, s/s of heart attack and stroke; Reviewed  prescribed diet heart healthy/ADA . 04-17-2021: Review of dietary restrictions with the granddaughter Reviewed medications with patient and discussed importance of compliance. 04-17-2021: The patients granddaughter states that he is taking his medications as prescribed  Discussed plans with patient for ongoing care management follow up and provided patient with direct contact information for care management team; Advised patient, providing education and rationale, to monitor blood pressure daily and record, calling PCP for findings outside established parameters;  Reviewed scheduled/upcoming provider appointments including:  Provided education on prescribed diet heart healthy/ADA diet;  Discussed complications of poorly controlled blood pressure such as heart disease, stroke, circulatory complications, vision complications, kidney impairment, sexual dysfunction;   Patient Goals/Self-Care Activities: Patient will self administer medications as prescribed as evidenced by self report/primary caregiver report  Patient will attend all scheduled provider appointments as evidenced by clinician review of documented attendance to scheduled appointments and patient/caregiver report Patient will call pharmacy for medication refills as evidenced by patient report and review of pharmacy fill history as appropriate Patient will attend church or other social activities as evidenced by patient report Patient will continue to perform ADL's independently as evidenced by patient/caregiver report Patient will continue to perform IADL's independently as evidenced by patient/caregiver report Patient will call provider office for new concerns or questions as evidenced by review of documented incoming telephone call notes and patient report Patient will work with BSW to address care coordination needs and will continue to work with the clinical team to address health care and disease management related needs as evidenced by  documented adherence to scheduled care management/care coordination appointments       Plan:Telephone follow up appointment with care management team member scheduled for:  06-10-2021 at 77 am  Noreene Larsson RN, MSN, [facility] Mobile: [phone]

## 2021-04-17 NOTE — Telephone Encounter (Signed)
3rd unsuccessful outreach  

## 2021-04-17 NOTE — Chronic Care Management (AMB) (Signed)
  Care Management   Note  04/17/2021 Name: Scott Lawson. MRN: [id number] DOB: 05-21-1941  [name] [name] is a 80 y.o. year old male who is a primary care patient of Venita Lick, NP and is actively engaged with the care management team. I reached out to [name] by phone today to assist with re-scheduling a follow up visit with the RN Case Manager and Pharmacist  Follow up plan: Unable to make contact on outreach attempts x 3. PCP Marnee Guarneri T, NPnotified via routed documentation in medical record.   Noreene Larsson, [place], [place], [place] [address] Direct Dial: [phone] [email or web address] Website: [email or web address]

## 2021-04-17 NOTE — Patient Instructions (Signed)
Visit Information  The patient verbalized understanding of instructions, educational materials, and care plan provided today and declined offer to receive copy of patient instructions, educational materials, and care plan. Sent a link to the patients granddaughter [name] to sign up for myChart.   Telephone follow up appointment with care management team member scheduled for: 06-10-2021 at 20 am  Noreene Larsson RN, MSN, [facility] Mobile: [phone]

## 2021-04-17 NOTE — Chronic Care Management (AMB) (Signed)
Chronic Care Management   CCM RN Visit Note  04/17/2021 Name: Scott Lawson. MRN: [id number] DOB: 11-29-1940  Subjective: Scott Bruins Nilsson [name] is a 80 y.o. year old male who is a primary care patient of Cannady, Barbaraann Faster, NP. The care management team was consulted for assistance with disease management and care coordination needs.    Engaged with patient by telephone for follow up visit in response to provider referral for case management and/or care coordination services. Incoming call from the patients granddaughter [name] after leaving a message asking for a call back.   Consent to Services:  The patient was given information about Chronic Care Management services, agreed to services, and gave verbal consent prior to initiation of services.  Please see initial visit note for detailed documentation.   Patient agreed to services and verbal consent obtained.   Assessment: Review of patient past medical history, allergies, medications, health status, including review of consultants reports, laboratory and other test data, was performed as part of comprehensive evaluation and provision of chronic care management services.   SDOH (Social Determinants of Health) assessments and interventions performed:    CCM Care Plan  Allergies  Allergen Reactions   Gabapentin Other (See Comments)    "made me fall out"    Outpatient Encounter Medications as of 04/17/2021  Medication Sig   albuterol (VENTOLIN HFA) 108 (90 Base) MCG/ACT inhaler Inhale 2 puffs into the lungs every 6 (six) hours as needed for wheezing or shortness of breath.   aspirin EC 81 MG tablet Take 81 mg by mouth daily. Swallow whole.   atorvastatin (LIPITOR) 80 MG tablet TAKE 1 TABLET EVERY DAY (Patient not taking: Reported on 04/09/2021)   fluticasone (FLONASE) 50 MCG/ACT nasal spray Place 2 sprays into both nostrils daily as needed.  (Patient not taking: Reported on 04/09/2021)   insulin glargine (LANTUS) 100 UNIT/ML  injection Inject 0.35 mLs (35 Units total) into the skin every evening. 1800   insulin lispro (HUMALOG KWIKPEN) 100 UNIT/ML KwikPen Inject 15 Units into the skin 3 (three) times daily before meals. Continue to check blood sugar after meals.   metFORMIN (GLUCOPHAGE) 500 MG tablet Take 2 tablets (1,000 mg total) by mouth 2 (two) times daily with a meal. 1060m in the morning 10063mat night   midodrine (PROAMATINE) 10 MG tablet Take 1 tablet (10 mg total) by mouth 3 (three) times daily with meals. (Patient not taking: Reported on 04/09/2021)   Omega-3 Fatty Acids (OMEGA 3 500 PO) Take 500 mg by mouth daily. (Patient not taking: Reported on 04/09/2021)   omeprazole (PRILOSEC) 20 MG capsule Take 1 capsule (20 mg total) by mouth daily. (Patient not taking: Reported on 04/09/2021)   tamsulosin (FLOMAX) 0.4 MG CAPS capsule TAKE 1 CAPSULE EVERY DAY   TRUE METRIX BLOOD GLUCOSE TEST test strip CHECK BLOOD SUGAR TWICE DAILY (Patient not taking: Reported on 04/09/2021)   TRUEplus Lancets 33G MISC CHECK BLOOD SUGAR TWICE DAILY (Patient not taking: Reported on 04/09/2021)   No facility-administered encounter medications on file as of 04/17/2021.    Patient Active Problem List   Diagnosis Date Noted   Memory impairment 03/13/2021   At high risk for falls 03/02/2021   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[facility]/23/2022   Weight loss, unintentional 02/04/2021   Uncontrolled type 2 diabetes mellitus with hyperosmolar nonketotic hyperglycemia (HCC) 02/04/2021   Chronic diastolic CHF (congestive heart failure) ([facility]/23/2022   Emphysema lung ([facility]/14/2022   History of 2019 novel coronavirus disease (COVID-19)  10/26/2020   Interstitial lung disease ([place]) 08/24/2020   Chronic respiratory failure with hypoxia ([place]) 06/20/2020   Atherosclerosis of aorta () 10/12/2019   Long-term insulin use ([place]) 10/12/2019   Vitamin B12 deficiency 06/28/2018   Prostate cancer ([place]) 06/27/2018   Psoriasis 08/16/2017   Advanced care  planning/counseling discussion 12/01/2016   GERD without esophagitis 08/14/2015   Uncontrolled type 2 diabetes mellitus with chronic kidney disease 08/13/2015   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[place]) 08/13/2015   Chronic kidney disease, stage 3a ([place]) 02/11/2015   Mixed hyperlipidemia due to type 2 diabetes mellitus ([place]) 02/11/2015   BPH (benign prostatic hyperplasia) 02/11/2015    Conditions to be addressed/monitored:CHF, HTN, HLD, DMII, Pulmonary Disease, and Falls and memory changes  Care Plan : RNCM: General Plan of Care (Adult) for Chronic Disease Management and Care Coordination Needs  Updates made by Vanita Ingles, RN since 04/17/2021 12:00 AM     Problem: RNCM: Adult plan of care for Chronic Disease Management and Care Coordination Needs   Priority: High  Onset Date: 03/11/2021     Long-Range Goal: RNCM: Adult plan of care for Chronic Disease Management and Care Coordination Needs   Start Date: 03/11/2021  Expected End Date: 03/11/2022  Recent Progress: On track  Priority: High  Note:   Current Barriers:  Knowledge Deficits related to plan of care for management of HTN, HLD, DMII, Pulmonary Disease, and falls prevention and safety, Memory changes and HF Care Coordination needs related to Limited social support, Level of care concerns, ADL IADL limitations, Family and relationship dysfunction, Social Isolation, Inability to perform IADL's independently, and non-compliance   Chronic Disease Management support and education needs related to HF, Memory changes, HTN, HLD, DMII,  Pulmonary Disease and falls prevention and safety  Lacks caregiver support.  Non-adherence to scheduled provider appointments Non-adherence to prescribed medication regimen  RNCM Clinical Goal(s):  Patient will verbalize understanding of plan for management of HF, Memory Changes, HTN, HLD, DMII, Pulmonary Disease, and Falls prevention and safety   verbalize basic  understanding of HF, Memory changes, HTN, HLD, DMII, Pulmonary Disease, and falls prevention and safety  disease process and self health management plan  take all medications exactly as prescribed and will call provider for medication related questions demonstrate understanding of rationale for each prescribed medication 03-11-2021: Granddaughter [name] states that she and her daughter are assisting the patient with his medications at this time.  attend all scheduled medical appointments: 03-13-2021, reminded the patients granddaughter [name] of this appointment today, [facility] has an appointment in the home today  demonstrate improved and ongoing adherence to prescribed treatment plan for HF, memory changes HTN, HLD, DMII, Pulmonary Disease, and falls risk and safety  as evidenced by daily monitoring and recording of CBG  adherence to ADA/ carb modified diet adherence to prescribed medication regimen contacting provider for new or worsened symptoms or questions compliance with the plan of care for effective management of chronic conditions   demonstrate improved and ongoing health management independence for effective management of Chronic diseases  continue to work with RN Care Manager to address care management and care coordination needs related to HTN, HLD, DMII, Pulmonary Disease, and falls prevention and safety   work with Education officer, museum to address Limited social support, Level of care concerns, ADL IADL limitations, Family and relationship dysfunction, Social Isolation, and noncompliance  related to the management of HF, Memory changes, HTN, HLD, DMII, Pulmonary Disease, and falls prevention  and safety  demonstrate a decrease in HF, Memory changes, HTN, HLD, DMII, Pulmonary Disease, and falls prevention and safety  exacerbations  demonstrate ongoing self health care management ability and effective management of chronic condtions  through collaboration with RN Care manager,  provider, and care team.   Interventions: 1:1 collaboration with primary care provider regarding development and update of comprehensive plan of care as evidenced by provider attestation and co-signature Inter-disciplinary care team collaboration (see longitudinal plan of care) Evaluation of current treatment plan related to  self management and patient's adherence to plan as established by provider   SDOH Barriers (Status: Goal on Track (progressing): YES.)  Patient interviewed and SDOH assessment performed        SDOH Interventions    Flowsheet Row Most Recent Value  SDOH Interventions   Food Insecurity Interventions Intervention Not Indicated  Transportation Interventions Intervention Not Indicated     Patient interviewed and appropriate assessments performed Provided patient with information about taking discharge paperwork with the patient to upcoming appointment with the pcp on 03-13-2021. In home visit today with the palliative care services. 04-17-2021: The patient has palliative services coming in. Spoke with granddaughter today. Care guide referral placed for assistance with resources in the community to get railings placed in the bathroom.  Discussed plans with patient for ongoing care management follow up and provided patient with direct contact information for care management team Advised patient to call the office for changes in condition, new questions, or concerns Collaborated with primary care provider re: related to patients health and well being and talking with the patient granddaughter today. The patient is currently living with the granddaughter who states she needs help in the home to help with the patients needs. 04-17-2021: Secure message sent to the pcp and LCSW letting them know that the granddaughter got back in touch with the [facility] today and she has appointments down for LCSW on Monday and will see Jolene in the office on 05-02-2021 with the patient.  Collaborated  with [facility]  (community agency) re: concerns and needs of the patient getting the care he needs in his current living environment and recent hospitalization for uncontrolled blood sugars and hemoglobin A1C of >15.5. 04-17-2021: Per [name] the patient has his insulin now and she is going to pick up a meter today to be able to check the patients blood sugars Provided education to patient/caregiver regarding level of care options.   Heart Failure Interventions:  (Status: Goal on Track (progressing): YES.)  Long Term Goal  Basic overview and discussion of pathophysiology of Heart Failure reviewed; Provided education on low sodium diet; Reviewed Heart Failure Action Plan in depth and provided written copy; Assessed need for readable accurate scales in home; Provided education about placing scale on hard, flat surface; Advised patient to weigh each morning after emptying bladder; Discussed importance of daily weight and advised patient to weigh and record daily; Reviewed role of diuretics in prevention of fluid overload and management of heart failure; Discussed the importance of keeping all appointments with provider; Provided patient with education about the role of exercise in the management of heart failure; Screening for signs and symptoms of depression related to chronic disease state;  Assessed social determinant of health barriers;    Memory Changes  (Status: Goal on track: NO.) Long Term Goal  Evaluation of current treatment plan related to  Memory Changes , Level of care concerns, Mental Health Concerns , and Family and relationship dysfunction self-management and patient's  adherence to plan as established by provider. Discussed plans with patient for ongoing care management follow up and provided patient with direct contact information for care management team Advised patient to call the office for changes in memory, depression, anxiety, changes in mood; Provided  education to patient re: working with the CCM team to get the things the patient needs to remain in his home and to be safe. Education and support given to the patients granddaughter [name]. ; Collaborated with pcp and LCSW regarding talking to the granddaughter [name] about expressed needs to effectively care for the patient in the home; Reviewed scheduled/upcoming provider appointments including 05-02-2021; Discussed plans with patient for ongoing care management follow up and provided patient with direct contact information for care management team; Advised patient to discuss the need for a hospital bed for the patient, and other concerns the granddaughter has related to the patients memory changes and her concerns with provider, advised the granddaughter to write down questions to discuss with the pcp at upcoming visit.     Emphysema  (Status: Goal on Track (progressing): YES.) Reviewed medications with patient, including use of prescribed maintenance and rescue inhalers, and provided instruction on medication management and the importance of adherence. 04-17-2021: [name] states the patient has all his medications and is taking as directed with her and her daughters assistance  Provided patient with basic written and verbal emphysema education on self care/management/and exacerbation prevention; Advised patient to track and manage emphysema triggers;  Provided instruction about proper use of medications used for management of emphysema  including inhalers; Advised patient to self assesses emphysema action plan zone and make appointment with provider if in the yellow zone for 48 hours without improvement; Advised patient to engage in light exercise as tolerated 3-5 days a week to aid in the the management of emphysema  Provided education about and advised patient to utilize infection prevention strategies to reduce risk of respiratory infection; Discussed the importance of adequate rest and  management of fatigue with emphysema   Diabetes:  (Status: Goal on track: NO.) Lab Results  Component Value Date   HGBA1C >15.5 (H) 03/02/2021  Assessed patient's understanding of A1c goal: <7% Provided education to patient about basic DM disease process; Reviewed medications with patient and discussed importance of medication adherence. 04-17-2021: Per [name] the patient is taking his insulin. He received from the [place]. She can tell a difference in his health.         Reviewed prescribed diet with patient Heart Healthy/ADA diet. 04-17-2021: The patient sometimes eats more than he should and things he should not. The granddaughter is trying to monitor his intake better and limit his overeating. Counseled on importance of regular laboratory monitoring as prescribed;        Discussed plans with patient for ongoing care management follow up and provided patient with direct contact information for care management team;      Provided patient with written educational materials related to hypo and hyperglycemia and importance of correct treatment. 04-17-2021: Review of sx and sx of hypo and hyperglycemia and what to look for.       Reviewed scheduled/upcoming provider appointments including: 05-02-2021 - reminded the granddaughter [name] of appointment today. Also the granddaughter knows to expect a call from the LCSW on 04-21-2021 ;         Advised patient, providing education and rationale, to check cbg as directed  and record. 04-17-2021:L Reports still not able to check blood sugars but the granddaughter states  she was going to pick up a meter today. Ask her to take blood sugars and record.        call provider for findings outside established parameters;       Referral made to pharmacy team for assistance with help with DM education, medication reconciliation and support and education for medications ;       Referral made to social work team for assistance with family dynamics and level of care concerns.  04-17-2021: Reminded the patients granddaughter of upcoming appointment with the LCSW on Monday;      Review of patient status, including review of consultants reports, relevant laboratory and other test results, and medications completed;       Advised patient to discuss effective management of DM and other chronic conditions with provider;      Screening for signs and symptoms of depression related to chronic disease state;        Assessed social determinant of health barriers;         Falls:  (Status: Goal on Track (progressing): YES.) Provided written and verbal education re: potential causes of falls and Fall prevention strategies Reviewed medications and discussed potential side effects of medications such as dizziness and frequent urination Advised patient of importance of notifying provider of falls Assessed for signs and symptoms of orthostatic hypotension Assessed for falls since last encounter. 04-17-2021: [name] states the patient is ambulating better and reports no new falls at this time. Review of safety. Assessed patients knowledge of fall risk prevention secondary to previously provided education Provided patient information for fall alert systems Advised patient to discuss safety concerns with provider  Hyperlipidemia:  (Status: Goal on track: YES.) Lab Results  Component Value Date   CHOL 173 02/04/2021   HDL 33 (L) 02/04/2021   LDLCALC 78 02/04/2021   TRIG 388 (H) 02/04/2021     Medication review performed; medication list updated in electronic medical record.  Provider established cholesterol goals reviewed; Counseled on importance of regular laboratory monitoring as prescribed; Provided HLD educational materials; Reviewed role and benefits of statin for ASCVD risk reduction; Discussed strategies to manage statin-induced myalgias; Reviewed importance of limiting foods high in cholesterol; Reviewed exercise goals and target of 150 minutes per week;  Hypertension:  (Status: Goal on track: YES.) Last practice recorded BP readings:  BP Readings from Last 3 Encounters:  03/13/21 (!) 88/55  03/04/21 121/82  02/05/21 115/67  Most recent eGFR/CrCl:  Lab Results  Component Value Date   EGFR 75 03/13/2021    No components found for: CRCL  Evaluation of current treatment plan related to hypertension self management and patient's adherence to plan as established by provider;   Provided education to patient re: stroke prevention, s/s of heart attack and stroke; Reviewed prescribed diet heart healthy/ADA . 04-17-2021: Review of dietary restrictions with the granddaughter Reviewed medications with patient and discussed importance of compliance. 04-17-2021: The patients granddaughter states that he is taking his medications as prescribed  Discussed plans with patient for ongoing care management follow up and provided patient with direct contact information for care management team; Advised patient, providing education and rationale, to monitor blood pressure daily and record, calling PCP for findings outside established parameters;  Reviewed scheduled/upcoming provider appointments including:  Provided education on prescribed diet heart healthy/ADA diet;  Discussed complications of poorly controlled blood pressure such as heart disease, stroke, circulatory complications, vision complications, kidney impairment, sexual dysfunction;   Patient Goals/Self-Care Activities: Patient will self administer medications as prescribed as  evidenced by self report/primary caregiver report  Patient will attend all scheduled provider appointments as evidenced by clinician review of documented attendance to scheduled appointments and patient/caregiver report Patient will call pharmacy for medication refills as evidenced by patient report and review of pharmacy fill history as appropriate Patient will attend church or other social activities as evidenced by patient report Patient will  continue to perform ADL's independently as evidenced by patient/caregiver report Patient will continue to perform IADL's independently as evidenced by patient/caregiver report Patient will call provider office for new concerns or questions as evidenced by review of documented incoming telephone call notes and patient report Patient will work with BSW to address care coordination needs and will continue to work with the clinical team to address health care and disease management related needs as evidenced by documented adherence to scheduled care management/care coordination appointments       Plan:Telephone follow up appointment with care management team member scheduled for:  06-10-2021 at 40 am  Noreene Larsson RN, MSN, [facility] Mobile: [phone]

## 2021-04-18 NOTE — Telephone Encounter (Signed)
  Care Management   Follow Up Note   04/18/2021 Name: Murdock Jellison Cedotal [name] MRN: [id number] DOB: 1940/07/14   Referred by: Venita Lick, NP Reason for referral : Chronic Care Management (RNCM: Follow up for Chronic Disease Management and Care Coordination Needs- attempt)   Incoming call back from the patients granddaughter. See new encounter.   Follow Up Plan: Telephone follow up appointment with care management team member scheduled for: 06-10-2021  Noreene Larsson RN, MSN, [facility] Mobile: [phone]

## 2021-04-21 ENCOUNTER — Telehealth: Payer: Medicare Other

## 2021-04-23 ENCOUNTER — Telehealth: Payer: Self-pay | Admitting: Licensed Clinical Social Worker

## 2021-04-23 NOTE — Telephone Encounter (Signed)
    Clinical Social Work  Care Management   Phone Outreach    04/23/2021 Name: Gavino Fouch. MRN: [id number] DOB: [birth year]  Princess Bruins Owczarzak [name] is a 80 y.o. year old male who is a primary care patient of Cannady, Barbaraann Faster, NP .   Reason for referral: Level of Care Concerns, Mental Health Counseling and Resources, and Caregiver Stress.    F/U phone call today to assess needs, progress and barriers with care plan goals.   3rd unsuccessful telephone outreach was attempted today. The patient was referred to the case management team for assistance with care management and care coordination. The patient's primary care provider has been notified of our unsuccessful attempts to make or maintain contact with the patient. The care management team is pleased to engage with this patient at any time in the future should he/she be interested in assistance from the care management team.   Plan: CCM LCSW will disconnect from care team but will remain available if any future concerns arise.   Review of patient status, including review of consultants reports, relevant laboratory and other test results, and collaboration with appropriate care team members and the patient's provider was performed as part of comprehensive patient evaluation and provision of care management services.    Christa See, MSW, [email or web address] Phone [phone] 9:30 AM        .

## 2021-04-24 ENCOUNTER — Telehealth: Payer: Self-pay

## 2021-04-24 NOTE — Telephone Encounter (Signed)
   Telephone encounter was:  Unsuccessful.  04/24/2021 Name: Scott Lawson. MRN: [id number] DOB: [birth year]  Unsuccessful outbound call made today to assist with:   bathroom railing  Outreach Attempt:  1st Attempt  A HIPAA compliant voice message was left requesting a return call.  Instructed patient to call back at [phone].  Ligaya Cormier, AAS Paralegal, [facility]  [address], [place] [address] ??[email or web address]  ?? [phone]   [email or web address]

## 2021-04-28 DIAGNOSIS — J479 Bronchiectasis, uncomplicated: Secondary | ICD-10-CM | POA: Diagnosis not present

## 2021-04-29 DIAGNOSIS — N183 Chronic kidney disease, stage 3 unspecified: Secondary | ICD-10-CM | POA: Diagnosis not present

## 2021-04-29 DIAGNOSIS — U071 COVID-19: Secondary | ICD-10-CM | POA: Diagnosis not present

## 2021-05-02 ENCOUNTER — Ambulatory Visit: Payer: Medicare Other | Admitting: Nurse Practitioner

## 2021-05-06 ENCOUNTER — Ambulatory Visit: Payer: Medicare Other | Admitting: Nurse Practitioner

## 2021-05-14 DIAGNOSIS — J432 Centrilobular emphysema: Secondary | ICD-10-CM | POA: Diagnosis not present

## 2021-05-14 DIAGNOSIS — E782 Mixed hyperlipidemia: Secondary | ICD-10-CM

## 2021-05-14 DIAGNOSIS — E785 Hyperlipidemia, unspecified: Secondary | ICD-10-CM

## 2021-05-14 DIAGNOSIS — I10 Essential (primary) hypertension: Secondary | ICD-10-CM

## 2021-05-14 DIAGNOSIS — I5032 Chronic diastolic (congestive) heart failure: Secondary | ICD-10-CM | POA: Diagnosis not present

## 2021-05-14 DIAGNOSIS — E1100 Type 2 diabetes mellitus with hyperosmolarity without nonketotic hyperglycemic-hyperosmolar coma (NKHHC): Secondary | ICD-10-CM

## 2021-05-14 DIAGNOSIS — E1169 Type 2 diabetes mellitus with other specified complication: Secondary | ICD-10-CM

## 2021-05-16 ENCOUNTER — Other Ambulatory Visit: Payer: Medicare Other | Admitting: Primary Care

## 2021-05-16 ENCOUNTER — Other Ambulatory Visit: Payer: Self-pay

## 2021-05-19 ENCOUNTER — Other Ambulatory Visit: Payer: Medicare Other | Admitting: Primary Care

## 2021-05-19 ENCOUNTER — Other Ambulatory Visit: Payer: Self-pay

## 2021-05-19 DIAGNOSIS — Z515 Encounter for palliative care: Secondary | ICD-10-CM | POA: Diagnosis not present

## 2021-05-19 DIAGNOSIS — R413 Other amnesia: Secondary | ICD-10-CM

## 2021-05-19 DIAGNOSIS — I5032 Chronic diastolic (congestive) heart failure: Secondary | ICD-10-CM | POA: Diagnosis not present

## 2021-05-19 NOTE — Progress Notes (Signed)
[facility] Palliative Care Consult Note Telephone: [phone]  Fax: [phone]    Date of encounter: 05/19/21 1:26 PM PATIENT NAME: Scott Lawson. [address] [place] [address]   [phone] (home)  DOB: 02/03/1941 MRN: [id number] PRIMARY CARE PROVIDER:    Venita Lick, NP,  [place] [address] [phone]  REFERRING PROVIDER:   Venita Lick, NP [address] [place],  [place] [address] [phone]  RESPONSIBLE PARTY:    Contact Information     Name Relation Home Work [place] Daughter [phone]  [phone]   Handel,[name] Granddaughter   [phone]   [name] Daughter [phone]  [phone]   [name],[name] Granddaughter   [phone]        I met face to face with patient and family in  home. Palliative Care was asked to follow this patient by consultation request of  Venita Lick, NP to address advance care planning and complex medical decision making. This is a follow up visit.                                   ASSESSMENT AND PLAN / RECOMMENDATIONS:   Advance Care Planning/Goals of Care: Goals include to maximize quality of life and symptom management. Our advance care planning conversation included a discussion about:    The value and importance of advance care planning  Experiences with loved ones who have been seriously ill or have died  Exploration of personal, cultural or spiritual beliefs that might influence medical decisions  Exploration of goals of care in the event of a sudden injury or illness  Identification and preparation of a healthcare agent  creation of an  advance directive document. CODE STATUS: FULL CODE  I completed a MOST form today. The patient and family outlined their wishes for the following treatment decisions:  Cardiopulmonary Resuscitation: Attempt Resuscitation (CPR)  Medical Interventions: Full Scope of Treatment: Use  intubation, advanced airway interventions, mechanical ventilation, cardioversion as indicated, medical treatment, IV fluids, etc, also provide comfort measures. Transfer to the hospital if indicated  Antibiotics: Antibiotics if indicated  IV Fluids: IV fluids if indicated  Feeding Tube: Feeding tube long-term if indicated   Symptom Management/Plan:  Diabetes-Blood sugar 365, decreased snacking in house but patient over eats. Endorses eating 9 pork chops at one sitting, which family corroborates. New glucose meter dropped off today, and instructions given. Endorses taking insulin and metformin.  Caregiver strain: Granddaughter would like to pursue guardianship and will call [place] office to find out if he has other income that can help with care giving. There is some tie up of his assets with family living on his property. Referred to social services for help with this.  Insomnia;Endorses insomnia, recommend trazodone 50 mg q hs for sleep. Needs BSC at night for safety. [facility], [facility].  Prevention: Endorses staying in but family goes in and out. Needs flu and another covid injection.   Follow up Palliative Care Visit: Palliative care will continue to follow for complex medical decision making, advance care planning, and clarification of goals. Return 6 weeks or prn.  I spent 60 minutes providing this consultation. More than 50% of the time in this consultation was spent in counseling and care coordination.  PPS: 40%  HOSPICE ELIGIBILITY/DIAGNOSIS: no  Chief Complaint: DM, dementia  HISTORY OF  PRESENT ILLNESS:  Scott Lawson. is a 80 y.o. year old male  with DM, memory loss, insomnia .   History obtained from review of EMR, discussion with primary team, and interview with family, facility staff/caregiver and/or Scott Lawson.  I reviewed available labs, medications, imaging, studies and related documents from the EMR.  Records reviewed and summarized above.    ROS   General: NAD EYES: denies vision changes ENMT: denies dysphagia Cardiovascular: denies chest pain, denies DOE Pulmonary: denies cough, denies increased SOB Abdomen: endorses good appetite, denies constipation, endorses loose stools, endorses continence of bowel GU: denies dysuria, endorses continence of urine MSK:  endorses weakness,  no falls reported Skin: denies rashes or wounds Neurological: denies pain,endorses insomnia Psych: Endorses positive mood, memory loss Heme/lymph/immuno: denies bruises, abnormal bleeding  Physical Exam: Current and past weights: stable  Constitutional: 116/58 HR 65 RR 18 General: frail appearing, WNWD EYES: anicteric sclera, lids intact, no discharge  ENMT: intact hearing, oral mucous membranes moist, dentition intact CV: S1S2, RRR, no LE edema Pulmonary: no increased work of breathing, no cough, room air Abdomen: intake 100% no ascites GU: deferred MSK: + sarcopenia, moves all extremities, ambulatory without device Skin: warm and dry, no rashes or wounds on visible skin Neuro:  ++ generalized weakness,  moderate to severe  cognitive impairment Psych: non-anxious affect, A and O x 1-2 Hem/lymph/immuno: no widespread bruising   Thank you for the opportunity to participate in the care of Scott Lawson.  The palliative care team will continue to follow. Please call our office at [phone] if we can be of additional assistance.   Jason Coop, NP DNP, AGPCNP-BC  COVID-19 PATIENT SCREENING TOOL Asked and negative response unless otherwise noted:   Have you had symptoms of covid, tested positive or been in contact with someone with symptoms/positive test in the past 5-10 days?

## 2021-05-20 ENCOUNTER — Telehealth: Payer: Self-pay

## 2021-05-20 NOTE — Telephone Encounter (Signed)
   Telephone encounter was:  Unsuccessful.  05/20/2021 Name: Scott Lawson. MRN: [id number] DOB: 01-13-1941  Unsuccessful outbound call made today to assist with:   bathroom railing  Outreach Attempt:  2nd Attempt  A HIPAA compliant voice message was left requesting a return call.  Instructed patient to call back at [phone].  Scott Lawson, Scott Lawson, [facility]  [address], [place] [address] ??[email or web address]  ?? [phone]   [email or web address]

## 2021-05-27 ENCOUNTER — Telehealth: Payer: Self-pay

## 2021-05-27 NOTE — Telephone Encounter (Signed)
° °  Telephone encounter was:  Unsuccessful.  05/27/2021 Name: Scott Lawson. MRN: [id number] DOB: 04-21-1941  Unsuccessful outbound call made today to assist with:   bathroom railing.  Outreach Attempt:  3rd Attempt.  Referral closed unable to contact patient.  A HIPAA compliant voice message was left requesting a return call.  Instructed patient to call back at [phone].  Shahram Alexopoulos, AAS Paralegal, [facility]  [address], [place] [address] ??[email or web address]   ?? [phone]   [email or web address]

## 2021-05-29 DIAGNOSIS — N183 Chronic kidney disease, stage 3 unspecified: Secondary | ICD-10-CM | POA: Diagnosis not present

## 2021-05-29 DIAGNOSIS — U071 COVID-19: Secondary | ICD-10-CM | POA: Diagnosis not present

## 2021-06-06 ENCOUNTER — Telehealth: Payer: Self-pay | Admitting: Nurse Practitioner

## 2021-06-06 MED ORDER — INSULIN PEN NEEDLE 29G X 12MM MISC: 1.0000 [IU] | Freq: Every day | 1 refills | Status: DC

## 2021-06-06 MED ORDER — "INSULIN SYRINGE 29G X 1/2"" 1 ML MISC": 1.0000 [IU] | Freq: Every day | 1 refills | Status: DC

## 2021-06-06 NOTE — Telephone Encounter (Signed)
Insulin needles sent to the pharmacy for patient.

## 2021-06-06 NOTE — Telephone Encounter (Signed)
Correct prescription sent to the pharmacy.

## 2021-06-10 ENCOUNTER — Telehealth: Payer: Medicare Other

## 2021-06-10 ENCOUNTER — Telehealth: Payer: Self-pay

## 2021-06-10 NOTE — Telephone Encounter (Signed)
°  Care Management   Follow Up Note   06/10/2021 Name: Scott Lawson. MRN: [id number] DOB: [birth year]   Referred by: Venita Lick, NP Reason for referral : Chronic Care Management (RNCM: Follow up for Chronic Disease Management and Care Coordination Needs )   An unsuccessful telephone outreach was attempted today. The patient was referred to the case management team for assistance with care management and care coordination.   Follow Up Plan: A HIPPA compliant phone message was left for the patient providing contact information and requesting a return call.    Noreene Larsson RN, MSN, [facility] Mobile: [phone]

## 2021-06-29 DIAGNOSIS — N183 Chronic kidney disease, stage 3 unspecified: Secondary | ICD-10-CM | POA: Diagnosis not present

## 2021-06-29 DIAGNOSIS — U071 COVID-19: Secondary | ICD-10-CM | POA: Diagnosis not present

## 2021-07-07 DIAGNOSIS — U071 COVID-19: Secondary | ICD-10-CM | POA: Diagnosis not present

## 2021-07-07 DIAGNOSIS — J9601 Acute respiratory failure with hypoxia: Secondary | ICD-10-CM | POA: Diagnosis not present

## 2021-07-25 ENCOUNTER — Telehealth: Payer: Medicare Other

## 2021-07-25 ENCOUNTER — Telehealth: Payer: Self-pay

## 2021-07-25 NOTE — Telephone Encounter (Signed)
°  Care Management   Follow Up Note   07/25/2021 Name: Kayleb Warshaw. MRN: [id number] DOB: 19-Oct-1940   Referred by: Venita Lick, NP Reason for referral : Chronic Care Management (RNCM: Follow up for Chronic Disease Management and Care Coordination Needs )   A second unsuccessful telephone outreach was attempted today. The patient was referred to the case management team for assistance with care management and care coordination.   Follow Up Plan: A HIPPA compliant phone message was left for the patient providing contact information and requesting a return call.   Noreene Larsson RN, MSN, [facility] Mobile: [phone]

## 2021-07-30 DIAGNOSIS — N183 Chronic kidney disease, stage 3 unspecified: Secondary | ICD-10-CM | POA: Diagnosis not present

## 2021-07-30 DIAGNOSIS — U071 COVID-19: Secondary | ICD-10-CM | POA: Diagnosis not present

## 2021-08-07 DIAGNOSIS — J9601 Acute respiratory failure with hypoxia: Secondary | ICD-10-CM | POA: Diagnosis not present

## 2021-08-07 DIAGNOSIS — U071 COVID-19: Secondary | ICD-10-CM | POA: Diagnosis not present

## 2021-08-11 ENCOUNTER — Telehealth: Payer: Self-pay

## 2021-08-11 NOTE — Chronic Care Management (AMB) (Unsigned)
°  Care Management   Note  08/11/2021 Name: Scott Lawson. MRN: [id number] DOB: 1940-09-07  Scott Lawson. is a 81 y.o. year old male who is a primary care patient of Venita Lick, NP and is actively engaged with the care management team. I reached out to [name] by phone today to assist with re-scheduling a follow up visit with the RN Case Manager  Follow up plan: Unsuccessful telephone outreach attempt made. A HIPAA compliant phone message was left for the patient providing contact information and requesting a return call.  The care management team will reach out to the patient again over the next 7 days.  If patient returns call to provider office, please advise to call [facility]  at [place], [place], [place], [place] [address] Direct Dial: [phone] [name]: [email or web address]

## 2021-08-13 ENCOUNTER — Telehealth: Payer: Self-pay

## 2021-08-13 NOTE — Chronic Care Management (AMB) (Signed)
? ? ?  Chronic Care Management ?Pharmacy Assistant  ? ?Name: Scott Lawson.  MRN: [id number] DOB: Aug 21, 1940 ? ?Reason for Encounter: Disease State General ? ? ?Medications: ?Outpatient Encounter Medications as of 08/13/2021  ?Medication Sig  ? albuterol (VENTOLIN HFA) 108 (90 Base) MCG/ACT inhaler Inhale 2 puffs into the lungs every 6 (six) hours as needed for wheezing or shortness of breath.  ? aspirin EC 81 MG tablet Take 81 mg by mouth daily. Swallow whole.  ? atorvastatin (LIPITOR) 80 MG tablet TAKE 1 TABLET EVERY DAY (Patient not taking: Reported on 04/09/2021)  ? fluticasone (FLONASE) 50 MCG/ACT nasal spray Place 2 sprays into both nostrils daily as needed.  (Patient not taking: Reported on 04/09/2021)  ? insulin glargine (LANTUS) 100 UNIT/ML injection Inject 0.35 mLs (35 Units total) into the skin every evening. 1800  ? insulin lispro (HUMALOG KWIKPEN) 100 UNIT/ML KwikPen Inject 15 Units into the skin 3 (three) times daily before meals. Continue to check blood sugar after meals.  ? Insulin Pen Needle 29G X 12MM MISC 1 Units by Does not apply route daily.  ? INSULIN SYRINGE 1CC/29G 29G X 1/2" 1 ML MISC 1 Units by Does not apply route daily.  ? metFORMIN (GLUCOPHAGE) 500 MG tablet Take 2 tablets (1,000 mg total) by mouth 2 (two) times daily with a meal. 1000mg  in the morning 1000mg  at night  ? Omega-3 Fatty Acids (OMEGA 3 500 PO) Take 500 mg by mouth daily. (Patient not taking: Reported on 04/09/2021)  ? omeprazole (PRILOSEC) 20 MG capsule Take 1 capsule (20 mg total) by mouth daily. (Patient not taking: Reported on 04/09/2021)  ? tamsulosin (FLOMAX) 0.4 MG CAPS capsule TAKE 1 CAPSULE EVERY DAY  ? TRUE METRIX BLOOD GLUCOSE TEST test strip CHECK BLOOD SUGAR TWICE DAILY (Patient not taking: Reported on 04/09/2021)  ? TRUEplus Lancets 33G MISC CHECK BLOOD SUGAR TWICE DAILY (Patient not taking: Reported on 04/09/2021)  ? ?No facility-administered encounter medications on file as of 08/13/2021.  ? ?Did not complete  assessment call due to patient being in Palliative care.  ? ?Corrie Mckusick, RMA ?Health Concierge ? ?

## 2021-08-27 ENCOUNTER — Telehealth: Payer: Self-pay | Admitting: Nurse Practitioner

## 2021-08-27 DIAGNOSIS — N183 Chronic kidney disease, stage 3 unspecified: Secondary | ICD-10-CM | POA: Diagnosis not present

## 2021-08-27 DIAGNOSIS — U071 COVID-19: Secondary | ICD-10-CM | POA: Diagnosis not present

## 2021-08-27 NOTE — Telephone Encounter (Signed)
Please advise, as we have tried to reach patient on different attempts to schedule an appointment for the patient. Please advise? ?

## 2021-08-27 NOTE — Telephone Encounter (Signed)
Left message for [facility] informing her of Jolene's prior message in regards to the patient and the requested paperwork. Daneil Dan to give our office a call back if she has any questions or concerns.  ?

## 2021-08-27 NOTE — Telephone Encounter (Signed)
Copied from [place] [phone]. Topic: General - Call Back - No Documentation ?>> Aug 27, 2021  1:04 PM Erick Blinks wrote: ?Miguel Aschoff, they provide the patient's oxygen equipment. This is for the high frequency chest wall documentation that was submitted 08/20/2021. Mary from wants a call back to confirm whether this was received or not.  ? ?Best contact: [phone] ?

## 2021-09-02 NOTE — Chronic Care Management (AMB) (Signed)
?  Care Management  ? ?Note ? ?09/02/2021 ?Name: Scott Lawson. MRN: [id number] DOB: Apr 30, 1941 ? ?[name] is a 81 y.o. year old male who is a primary care patient of Venita Lick, NP and is actively engaged with the care management team. I reached out to Scott Lawson. by phone today to assist with re-scheduling a follow up visit with the RN Case Manager ? ?Follow up plan: ?Unsuccessful telephone outreach attempt made. A HIPAA compliant phone message was left for the patient providing contact information and requesting a return call.  ?The care management team will reach out to the patient again over the next 7 days.  ?If patient returns call to provider office, please advise to call [facility]  at [phone] ? ?Noreene Larsson, RMA ?Care Guide, Embedded Care Coordination ?[facility]  [facility]  ?[place], [place] [address] ?Direct Dial: [phone] ?[email or web address] ?Website: [email or web address]  ? ?

## 2021-09-04 DIAGNOSIS — J9601 Acute respiratory failure with hypoxia: Secondary | ICD-10-CM | POA: Diagnosis not present

## 2021-09-04 DIAGNOSIS — U071 COVID-19: Secondary | ICD-10-CM | POA: Diagnosis not present

## 2021-09-12 ENCOUNTER — Ambulatory Visit: Payer: Medicare Other | Admitting: Nurse Practitioner

## 2021-09-16 NOTE — Chronic Care Management (AMB) (Signed)
?  Care Management  ? ?Note ? ?09/16/2021 ?Name: [name] MRN: [id number] DOB: 08-29-1940 ? ?Princess Bruins Kloosterman [name] is a 81 y.o. year old male who is a primary care patient of Venita Lick, NP and is actively engaged with the care management team. I reached out to [name] by phone today to assist with re-scheduling a follow up visit with the RN Case Manager ? ?Follow up plan: ?Unable to make contact on outreach attempts x 3. PCP Venita Lick, NP notified via routed documentation in medical record.  ? ?Noreene Larsson, RMA ?Care Guide, Embedded Care Coordination ?[facility]  Care Management  ?[place], [place] [address] ?Direct Dial: [phone] ?[email or web address] ?Website: [email or web address]  ? ?

## 2021-09-26 DIAGNOSIS — J479 Bronchiectasis, uncomplicated: Secondary | ICD-10-CM | POA: Diagnosis not present

## 2021-09-27 DIAGNOSIS — U071 COVID-19: Secondary | ICD-10-CM | POA: Diagnosis not present

## 2021-09-27 DIAGNOSIS — N183 Chronic kidney disease, stage 3 unspecified: Secondary | ICD-10-CM | POA: Diagnosis not present

## 2021-10-03 ENCOUNTER — Emergency Department
Admission: EM | Admit: 2021-10-03 | Discharge: 2021-10-03 | Disposition: A | Discharge status: Home / Self Care | Payer: Medicare Other | Attending: Emergency Medicine | Admitting: Emergency Medicine

## 2021-10-03 DIAGNOSIS — Z8709 Personal history of other diseases of the respiratory system: Secondary | ICD-10-CM

## 2021-10-03 DIAGNOSIS — U071 COVID-19: Secondary | ICD-10-CM | POA: Diagnosis not present

## 2021-10-03 DIAGNOSIS — I10 Essential (primary) hypertension: Secondary | ICD-10-CM | POA: Diagnosis not present

## 2021-10-03 DIAGNOSIS — Z7984 Long term (current) use of oral hypoglycemic drugs: Secondary | ICD-10-CM | POA: Insufficient documentation

## 2021-10-03 DIAGNOSIS — Z794 Long term (current) use of insulin: Secondary | ICD-10-CM | POA: Insufficient documentation

## 2021-10-03 DIAGNOSIS — Z7982 Long term (current) use of aspirin: Secondary | ICD-10-CM | POA: Diagnosis not present

## 2021-10-03 DIAGNOSIS — Z79899 Other long term (current) drug therapy: Secondary | ICD-10-CM | POA: Insufficient documentation

## 2021-10-03 DIAGNOSIS — J069 Acute upper respiratory infection, unspecified: Secondary | ICD-10-CM | POA: Diagnosis not present

## 2021-10-03 DIAGNOSIS — E119 Type 2 diabetes mellitus without complications: Secondary | ICD-10-CM | POA: Insufficient documentation

## 2021-10-03 DIAGNOSIS — B9789 Other viral agents as the cause of diseases classified elsewhere: Secondary | ICD-10-CM | POA: Diagnosis not present

## 2021-10-03 DIAGNOSIS — R051 Acute cough: Secondary | ICD-10-CM | POA: Diagnosis not present

## 2021-10-03 DIAGNOSIS — J449 Chronic obstructive pulmonary disease, unspecified: Secondary | ICD-10-CM | POA: Insufficient documentation

## 2021-10-03 DIAGNOSIS — R059 Cough, unspecified: Secondary | ICD-10-CM | POA: Diagnosis present

## 2021-10-03 LAB — RESP PANEL BY RT-PCR (FLU A&B, COVID) ARPGX2
Influenza A by PCR: NEGATIVE
Influenza B by PCR: NEGATIVE
SARS Coronavirus 2 by RT PCR: POSITIVE — AB

## 2021-10-03 MED ORDER — PREDNISONE 20 MG PO TABS: 40.0000 mg | ORAL_TABLET | Freq: Every day | ORAL | 0 refills | Status: AC

## 2021-10-03 MED ORDER — ALBUTEROL SULFATE HFA 108 (90 BASE) MCG/ACT IN AERS: 2.0000 | INHALATION_SPRAY | Freq: Four times a day (QID) | RESPIRATORY_TRACT | 2 refills | Status: DC | PRN

## 2021-10-03 MED ORDER — BENZONATATE 100 MG PO CAPS: 100.0000 mg | ORAL_CAPSULE | Freq: Three times a day (TID) | ORAL | 0 refills | Status: DC | PRN

## 2021-10-03 MED ORDER — AZITHROMYCIN 250 MG PO TABS
ORAL_TABLET | ORAL | 0 refills | Status: DC
Start: 2021-10-03 — End: 2021-10-22

## 2021-10-03 NOTE — Discharge Instructions (Signed)
Please take medications as prescribed.  Return to the ER for any worsening shortness of breath, fevers, worsening symptoms or any changes in your health. ?

## 2021-10-03 NOTE — ED Triage Notes (Signed)
Patient to ER via [facility] with granddaughter. Reports x2-3 days of cough, congestion, and running nose. Denies chest pain or SHOB.  ?

## 2021-10-03 NOTE — ED Provider Notes (Signed)
?[facility] EMERGENCY DEPARTMENT ?Provider Note ? ? ?CSN: [id number] ?Arrival date & time: 10/03/21  1634 ? ?  ? ?History ? ?Chief Complaint  ?Patient presents with  ? Nasal Congestion  ? ? ?Scott Bruins Casady [name] is a 81 y.o. male with a history of COPD, diabetes, hypertension presents to the emergency department for evaluation of cough, congestion, runny nose.  Symptoms been present for 2 to 3 days.  He denies any fevers, chills, body aches.  No chest pain or shortness of breath.  Patient states at times he is on oxygen but has not been wearing his oxygen.  He has not been taking any medications for his symptoms.  He does describe a slightly productive cough which is different from his baseline.  He denies any shortness of breath with exertion ? ?HPI ? ?  ? ?Home Medications ?Prior to Admission medications   ?Medication Sig Start Date End Date Taking? Authorizing Provider  ?albuterol (VENTOLIN HFA) 108 (90 Base) MCG/ACT inhaler Inhale 2 puffs into the lungs every 6 (six) hours as needed for wheezing or shortness of breath. 10/03/21  Yes Duanne Guess, PA-C  ?azithromycin (ZITHROMAX Z-PAK) 250 MG tablet Take 2 tablets (500 mg) on  Day 1,  followed by 1 tablet (250 mg) once daily on Days 2 through 5. 10/03/21  Yes Duanne Guess, PA-C  ?benzonatate (TESSALON PERLES) 100 MG capsule Take 1 capsule (100 mg total) by mouth 3 (three) times daily as needed for cough. 10/03/21 10/03/22 Yes Duanne Guess, PA-C  ?predniSONE (DELTASONE) 20 MG tablet Take 2 tablets (40 mg total) by mouth daily for 5 days. 10/03/21 10/08/21 Yes Duanne Guess, PA-C  ?aspirin EC 81 MG tablet Take 81 mg by mouth daily. Swallow whole.    [provider]  ?insulin glargine (LANTUS) 100 UNIT/ML injection Inject 0.35 mLs (35 Units total) into the skin every evening. 1800 02/04/21   Cannady, Henrine Screws T, NP  ?insulin lispro (HUMALOG KWIKPEN) 100 UNIT/ML KwikPen Inject 15 Units into the skin 3 (three) times daily before  meals. Continue to check blood sugar after meals. 02/05/21   Sharen Hones, MD  ?Insulin Pen Needle 29G X 12MM MISC 1 Units by Does not apply route daily. 06/06/21   Jon Billings, NP  ?INSULIN SYRINGE 1CC/29G 29G X 1/2" 1 ML MISC 1 Units by Does not apply route daily. 06/06/21   Jon Billings, NP  ?metFORMIN (GLUCOPHAGE) 500 MG tablet Take 2 tablets (1,000 mg total) by mouth 2 (two) times daily with a meal. '1000mg'$  in the morning '1000mg'$  at night 05/17/20   Marnee Guarneri T, NP  ?Omega-3 Fatty Acids (OMEGA 3 500 PO) Take 500 mg by mouth daily. ?Patient not taking: Reported on 04/09/2021    [provider]  ?omeprazole (PRILOSEC) 20 MG capsule Take 1 capsule (20 mg total) by mouth daily. ?Patient not taking: Reported on 04/09/2021 01/14/21   Marnee Guarneri T, NP  ?tamsulosin (FLOMAX) 0.4 MG CAPS capsule TAKE 1 CAPSULE EVERY DAY 07/31/20   Cannady, Henrine Screws T, NP  ?TRUE METRIX BLOOD GLUCOSE TEST test strip CHECK BLOOD SUGAR TWICE DAILY ?Patient not taking: Reported on 04/09/2021 11/01/20   Venita Lick, NP  ?TRUEplus Lancets 33G MISC CHECK BLOOD SUGAR TWICE DAILY ?Patient not taking: Reported on 04/09/2021 10/18/20   Venita Lick, NP  ?   ? ?Allergies    ?Gabapentin   ? ?Review of Systems   ?Review of Systems ? ?Physical Exam ?Updated Vital Signs ?  BP 118/64   Pulse 85   Temp 98 ?F (36.7 ?C)   Resp 19   Ht '5\' 9"'$  (1.753 m)   Wt 72.6 kg   SpO2 91%   BMI 23.63 kg/m?  ?Physical Exam ?Constitutional:   ?   General: He is not in acute distress. ?   Appearance: Normal appearance. He is well-developed. He is not ill-appearing.  ?HENT:  ?   Head: Normocephalic and atraumatic.  ?   Right Ear: External ear normal.  ?   Left Ear: External ear normal.  ?   Nose: Congestion present.  ?   Mouth/Throat:  ?   Pharynx: No oropharyngeal exudate or posterior oropharyngeal erythema.  ?Eyes:  ?   Conjunctiva/sclera: Conjunctivae normal.  ?Cardiovascular:  ?   Rate and Rhythm: Normal rate.  ?   Pulses: Normal pulses.   ?   Heart sounds: Normal heart sounds.  ?Pulmonary:  ?   Effort: Pulmonary effort is normal. No respiratory distress.  ?   Breath sounds: Normal breath sounds. No wheezing or rales.  ?Abdominal:  ?   General: There is no distension.  ?   Tenderness: There is no abdominal tenderness. There is no guarding.  ?Musculoskeletal:     ?   General: Normal range of motion.  ?   Cervical back: Normal range of motion.  ?Skin: ?   General: Skin is warm.  ?   Findings: No rash.  ?Neurological:  ?   Mental Status: He is alert and oriented to person, place, and time.  ?Psychiatric:     ?   Behavior: Behavior normal.     ?   Thought Content: Thought content normal.  ? ? ?ED Results / Procedures / Treatments   ?Labs ?(all labs ordered are listed, but only abnormal results are displayed) ?Labs Reviewed  ?RESP PANEL BY RT-PCR (FLU A&B, COVID) ARPGX2  ? ? ?EKG ?None ? ?Radiology ?No results found. ? ?Procedures ?Procedures  ? ? ?Medications Ordered in ED ?Medications - No data to display ? ?ED Course/ Medical Decision Making/ A&P ?  ?                        ?Medical Decision Making ?Risk ?Prescription drug management. ? ? ?81 year old male with history of COPD presents to the emergency department for 2 to 3 days of cough congestion runny nose.  He is afebrile.  Slight change in baseline cough with productivity.  Denies any chest pain, shortness of breath.  At times will wear oxygen at home but has not been needing this.  He has not been taking medications for symptoms.  He appears well and in no distress.  Denies any exertional dyspnea.  COVID flu test pending.  Due to history of COPD, will place on azithromycin and give prescription for prednisone and patient given Tessalon Perles for cough.  He understands signs and symptoms return to the ER for. ?Final Clinical Impression(s) / ED Diagnoses ?Final diagnoses:  ?Acute cough  ?Viral URI with cough  ?History of COPD  ? ? ?Rx / DC Orders ?ED Discharge Orders   ? ?      Ordered  ?   albuterol (VENTOLIN HFA) 108 (90 Base) MCG/ACT inhaler  Every 6 hours PRN       ? 10/03/21 1712  ?  azithromycin (ZITHROMAX Z-PAK) 250 MG tablet       ? 10/03/21 1712  ?  predniSONE (DELTASONE) 20 MG tablet  Daily       ? 10/03/21 1712  ?  benzonatate (TESSALON PERLES) 100 MG capsule  3 times daily PRN       ? 10/03/21 1712  ? ?  ?  ? ?  ? ? ?  ?Duanne Guess, PA-C ?10/03/21 1718 ? ?  ?Naaman Plummer, MD ?10/04/21 1151 ? ?

## 2021-10-05 DIAGNOSIS — U071 COVID-19: Secondary | ICD-10-CM | POA: Diagnosis not present

## 2021-10-05 DIAGNOSIS — J9601 Acute respiratory failure with hypoxia: Secondary | ICD-10-CM | POA: Diagnosis not present

## 2021-10-06 ENCOUNTER — Telehealth: Payer: Self-pay | Admitting: *Deleted

## 2021-10-06 NOTE — Telephone Encounter (Signed)
Transition Care Management Unsuccessful Follow-up Telephone Call ? ?Date of discharge and from where:  [facility] 10-05-2021 ? ?Attempts:  1st Attempt ? ?Reason for unsuccessful TCM follow-up call:  Voice mail full ? ?  ?

## 2021-10-07 NOTE — Telephone Encounter (Signed)
Transition Care Management Unsuccessful Follow-up Telephone Call ? ?Date of discharge and from where:  [facility] 10-03-2021 ? ?Attempts:  2nd Attempt ? ?Reason for unsuccessful TCM follow-up call:  Voice mail full ? ?  ?

## 2021-10-08 NOTE — Telephone Encounter (Signed)
Transition Care Management Unsuccessful Follow-up Telephone Call ? ?Date of discharge and from where:  [facility] ? ?Attempts:  3rd Attempt ? ?Reason for unsuccessful TCM follow-up call:  Left voice message ? ?  ?

## 2021-10-22 ENCOUNTER — Other Ambulatory Visit: Payer: Self-pay

## 2021-10-22 ENCOUNTER — Encounter: Payer: Self-pay | Admitting: *Deleted

## 2021-10-22 ENCOUNTER — Emergency Department
Admission: EM | Admit: 2021-10-22 | Discharge: 2021-10-23 | Disposition: A | Discharge status: Home / Self Care | Payer: Medicare Other | Attending: Emergency Medicine | Admitting: Emergency Medicine

## 2021-10-22 ENCOUNTER — Emergency Department: Payer: Medicare Other

## 2021-10-22 DIAGNOSIS — I1310 Hypertensive heart and chronic kidney disease without heart failure, with stage 1 through stage 4 chronic kidney disease, or unspecified chronic kidney disease: Secondary | ICD-10-CM | POA: Diagnosis present

## 2021-10-22 DIAGNOSIS — R413 Other amnesia: Secondary | ICD-10-CM | POA: Diagnosis present

## 2021-10-22 DIAGNOSIS — R634 Abnormal weight loss: Secondary | ICD-10-CM | POA: Diagnosis present

## 2021-10-22 DIAGNOSIS — R9431 Abnormal electrocardiogram [ECG] [EKG]: Secondary | ICD-10-CM | POA: Diagnosis not present

## 2021-10-22 DIAGNOSIS — Z794 Long term (current) use of insulin: Secondary | ICD-10-CM | POA: Insufficient documentation

## 2021-10-22 DIAGNOSIS — F0390 Unspecified dementia without behavioral disturbance: Secondary | ICD-10-CM | POA: Insufficient documentation

## 2021-10-22 DIAGNOSIS — R4689 Other symptoms and signs involving appearance and behavior: Secondary | ICD-10-CM | POA: Diagnosis not present

## 2021-10-22 DIAGNOSIS — Z7984 Long term (current) use of oral hypoglycemic drugs: Secondary | ICD-10-CM | POA: Insufficient documentation

## 2021-10-22 DIAGNOSIS — Z79899 Other long term (current) drug therapy: Secondary | ICD-10-CM | POA: Diagnosis not present

## 2021-10-22 DIAGNOSIS — N183 Chronic kidney disease, stage 3 unspecified: Secondary | ICD-10-CM | POA: Diagnosis not present

## 2021-10-22 DIAGNOSIS — R456 Violent behavior: Secondary | ICD-10-CM | POA: Insufficient documentation

## 2021-10-22 DIAGNOSIS — E1122 Type 2 diabetes mellitus with diabetic chronic kidney disease: Secondary | ICD-10-CM | POA: Diagnosis not present

## 2021-10-22 DIAGNOSIS — N1831 Chronic kidney disease, stage 3a: Secondary | ICD-10-CM | POA: Insufficient documentation

## 2021-10-22 DIAGNOSIS — I130 Hypertensive heart and chronic kidney disease with heart failure and stage 1 through stage 4 chronic kidney disease, or unspecified chronic kidney disease: Secondary | ICD-10-CM | POA: Insufficient documentation

## 2021-10-22 DIAGNOSIS — J439 Emphysema, unspecified: Secondary | ICD-10-CM | POA: Diagnosis not present

## 2021-10-22 DIAGNOSIS — I5032 Chronic diastolic (congestive) heart failure: Secondary | ICD-10-CM | POA: Diagnosis not present

## 2021-10-22 DIAGNOSIS — R4189 Other symptoms and signs involving cognitive functions and awareness: Secondary | ICD-10-CM | POA: Diagnosis not present

## 2021-10-22 DIAGNOSIS — I700 Atherosclerosis of aorta: Secondary | ICD-10-CM | POA: Insufficient documentation

## 2021-10-22 DIAGNOSIS — J849 Interstitial pulmonary disease, unspecified: Secondary | ICD-10-CM | POA: Diagnosis present

## 2021-10-22 DIAGNOSIS — J8410 Pulmonary fibrosis, unspecified: Secondary | ICD-10-CM | POA: Diagnosis not present

## 2021-10-22 DIAGNOSIS — Z046 Encounter for general psychiatric examination, requested by authority: Secondary | ICD-10-CM | POA: Diagnosis present

## 2021-10-22 DIAGNOSIS — I10 Essential (primary) hypertension: Secondary | ICD-10-CM | POA: Diagnosis not present

## 2021-10-22 DIAGNOSIS — Z9181 History of falling: Secondary | ICD-10-CM

## 2021-10-22 LAB — BASIC METABOLIC PANEL
Anion gap: 7 (ref 5–15)
BUN: 26 mg/dL — ABNORMAL HIGH (ref 8–23)
CO2: 28 mmol/L (ref 22–32)
Calcium: 9.3 mg/dL (ref 8.9–10.3)
Chloride: 104 mmol/L (ref 98–111)
Creatinine, Ser: 1.54 mg/dL — ABNORMAL HIGH (ref 0.61–1.24)
GFR, Estimated: 45 mL/min — ABNORMAL LOW (ref >60)
Glucose, Bld: 342 mg/dL — ABNORMAL HIGH (ref 70–99)
Potassium: 4.8 mmol/L (ref 3.5–5.1)
Sodium: 139 mmol/L (ref 135–145)

## 2021-10-22 LAB — CBC
HCT: 46.5 % (ref 39.0–52.0)
Hemoglobin: 14.9 g/dL (ref 13.0–17.0)
MCH: 29.6 pg (ref 26.0–34.0)
MCHC: 32 g/dL (ref 30.0–36.0)
MCV: 92.3 fL (ref 80.0–100.0)
Platelets: 222 10*3/uL (ref 150–400)
RBC: 5.04 MIL/uL (ref 4.22–5.81)
RDW: 12.3 % (ref 11.5–15.5)
WBC: 5.3 10*3/uL (ref 4.0–10.5)
nRBC: 0 % (ref 0.0–0.2)

## 2021-10-22 LAB — URINALYSIS, COMPLETE (UACMP) WITH MICROSCOPIC
Bacteria, UA: NONE SEEN
Bilirubin Urine: NEGATIVE
Glucose, UA: >=500 mg/dL — AB
Hgb urine dipstick: NEGATIVE
Ketones, ur: NEGATIVE mg/dL
Leukocytes,Ua: NEGATIVE
Nitrite: NEGATIVE
Protein, ur: NEGATIVE mg/dL
Specific Gravity, Urine: 1.022 (ref 1.005–1.030)
Squamous Epithelial / HPF: NONE SEEN (ref 0–5)
pH: 5 (ref 5.0–8.0)

## 2021-10-22 LAB — TROPONIN I (HIGH SENSITIVITY)
Troponin I (High Sensitivity): 21 ng/L — ABNORMAL HIGH (ref <18)
Troponin I (High Sensitivity): 20 ng/L — ABNORMAL HIGH (ref <18)

## 2021-10-22 NOTE — ED Triage Notes (Signed)
Pt brought in by the [place] sheriff dept with hypertension.  No chest pain or sob.  No cough.  Non smoker.  Pt alert  speech clear.  Pt walks with a cane.  No dizziness.  No n/v ?
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
## 2021-10-23 DIAGNOSIS — R4689 Other symptoms and signs involving appearance and behavior: Secondary | ICD-10-CM | POA: Diagnosis not present

## 2021-10-23 LAB — CBG MONITORING, ED: Glucose-Capillary: 258 mg/dL — ABNORMAL HIGH (ref 70–99)

## 2021-10-23 MED ORDER — METFORMIN HCL 500 MG PO TABS
1000.0000 mg | ORAL_TABLET | Freq: Two times a day (BID) | ORAL | Status: DC
Start: 2021-10-23 — End: 2021-10-23

## 2021-10-23 MED ORDER — INSULIN GLARGINE-YFGN 100 UNIT/ML ~~LOC~~ SOLN
35.0000 [IU] | Freq: Every evening | SUBCUTANEOUS | Status: DC
Start: 2021-10-23 — End: 2021-10-23
  Filled 2021-10-23: qty 0.35

## 2021-10-23 MED ORDER — TAMSULOSIN HCL 0.4 MG PO CAPS: 0.4000 mg | ORAL_CAPSULE | Freq: Every day | ORAL | Status: DC

## 2021-10-23 MED ORDER — INSULIN ASPART 100 UNIT/ML IJ SOLN: 15.0000 [IU] | Freq: Three times a day (TID) | INTRAMUSCULAR | Status: DC

## 2021-10-23 MED ORDER — ASPIRIN EC 81 MG PO TBEC: 81.0000 mg | DELAYED_RELEASE_TABLET | Freq: Every day | ORAL | Status: DC

## 2021-10-23 MED ORDER — INSULIN PEN NEEDLE 29G X 12MM MISC: 1.0000 [IU] | Freq: Every day | Status: DC

## 2021-10-23 MED ORDER — ALBUTEROL SULFATE HFA 108 (90 BASE) MCG/ACT IN AERS: 2.0000 | INHALATION_SPRAY | Freq: Four times a day (QID) | RESPIRATORY_TRACT | Status: DC | PRN

## 2021-10-23 NOTE — BH Assessment (Signed)
Referral information for Geropsychiatric Hospitalization faxed to: ? ?[facility] [phone]),  ? ?[facility] [phone] ? ?[facility]([phone], ? ?[phone],  ? ?[place] [phone],  ? ?[facility]([phone]. ? ?

## 2021-10-23 NOTE — Discharge Instructions (Signed)
Please talk to your primary care doctor about potentially placing him into a dementia home if he continues to have the agitation and issues.  He is not able to go to psychiatric services due to the dementia.  Also he can discuss with your primary care doctor adjusting medications. ?

## 2021-10-23 NOTE — ED Provider Notes (Signed)
1:38 PM Assumed care for off going team.  ? ?Blood pressure 120/80, pulse (!) 116, temperature (P) 98.7 ?F (37.1 ?C), temperature source (P) Oral, resp. rate 15, height '5\' 9"'$  (1.753 m), weight 72.6 kg, SpO2 97 %. ? ?See their HPI for full report but in brief pending geripsych ? ? ?Patient seen by psychiatry who stated that patient is not a candidate for [facility] psych given concern for early dementia.  They have talked to the daughter who is okay with him coming back home.  ? ? ?  ?Vanessa Austin, MD ?10/23/21 1433 ? ?

## 2021-10-23 NOTE — Progress Notes (Signed)
Inpatient Diabetes Program Recommendations ? ?AACE/ADA: New Consensus Statement on Inpatient Glycemic Control (2015) ? ?Target Ranges:  Prepandial:   less than 140 mg/dL ?     Peak postprandial:   less than 180 mg/dL (1-2 hours) ?     Critically ill patients:  140 - 180 mg/dL  ? ?Lab Results  ?Component Value Date  ? GLUCAP 258 (H) 10/23/2021  ? HGBA1C >15.5 (H) 03/02/2021  ? ? ?Review of Glycemic Control ? Latest Reference Range & Units 10/22/21 19:52  ?Glucose 70 - 99 mg/dL 342 (H)  ? ?Diabetes history: DM 2 ?Outpatient Diabetes medications:  ?Lantus 35 units q HS ?Metformin 1000 mg bid ?Humalog 15 units tid with meals ?Current orders for Inpatient glycemic control:  ?Semglee 35 units q PM ?Novolog 15 units tid with meals ?Glucophage 1000 mg bid ?Inpatient Diabetes Program Recommendations:   ?May consider adding Novolog moderate correction tid with meals.  ? ?Thanks,  ? ?Adah Perl, RN, BC-ADM ?Inpatient Diabetes Coordinator ?Pager [phone]  (8a-5p) ? ? ? ? ?

## 2021-10-23 NOTE — ED Notes (Signed)
Patient provided with phone to speak with granddaughter.  ?

## 2021-10-23 NOTE — ED Notes (Signed)
Cbg 258 ?

## 2021-10-23 NOTE — TOC Initial Note (Signed)
Transition of Care (TOC) - Initial/Assessment Note  ? ? ?Patient Details  ?Name: Scott Lawson. ?MRN: [id number] ?Date of Birth: [birth year] ? ?Transition of Care (TOC) CM/SW Contact:    ?Shelbie Hutching, RN ?Phone Number: ?10/23/2021, 2:51 PM ? ?Clinical Narrative:                 ?TOC consult acknowledged.  Patient's granddaughter will be coming to pick him up once she gets off work this afternoon.   ? ?  ?  ? ? ?Patient Goals and CMS Choice ?  ?  ?  ? ?Expected Discharge Plan and Services ?  ?  ?  ?  ?  ?                ?  ?  ?  ?  ?  ?  ?  ?  ?  ?  ? ?Prior Living Arrangements/Services ?  ?  ?  ?       ?  ?  ?  ?  ? ?Activities of Daily Living ?  ?  ? ?Permission Sought/Granted ?  ?  ?   ?   ?   ?   ? ?Emotional Assessment ?  ?  ?  ?  ?  ?  ? ?Admission diagnosis:  High bp ?Patient Active Problem List  ? Diagnosis Date Noted  ? Aggressive behavior 10/23/2021  ? Memory impairment 03/13/2021  ? At high risk for falls 03/02/2021  ? 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 Weight loss, unintentional 02/04/2021  ? Uncontrolled type 2 diabetes mellitus with hyperosmolar nonketotic hyperglycemia ([place]) 02/04/2021  ? Chronic diastolic CHF (congestive heart failure) ([place]) 02/04/2021  ? Emphysema lung ([place]) 10/26/2020  ? History of 2019 novel coronavirus disease (COVID-19) 10/26/2020  ? Interstitial lung disease ([place]) 08/24/2020  ? Chronic respiratory failure with hypoxia ([place]) 06/20/2020  ? Atherosclerosis of aorta ([place]) 10/12/2019  ? Long-term insulin use ([place]) 10/12/2019  ? Vitamin B12 deficiency 06/28/2018  ? Prostate cancer ([place]) 06/27/2018  ? Psoriasis 08/16/2017  ? Advanced care planning/counseling discussion 12/01/2016  ? GERD without esophagitis 08/14/2015  ? Uncontrolled type 2 diabetes mellitus with chronic kidney disease 08/13/2015  ? 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[place]) 08/13/2015  ? Chronic kidney disease, stage 3a ([place]) 02/11/2015  ? 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 BPH (benign prostatic hyperplasia) 02/11/2015  ? ?PCP:  Venita Lick, NP ?Pharmacy:   ?[facility] [place] ?[address] ?[place] [address] ?Phone: [phone] Fax: [phone] ? ?[facility] [place], [place] AT [facility] OF [address] ?[place] ?[place] [address] ?Phone: [phone] Fax: [phone] ? ?[place] [place] ?[address] ?[place] [address] ?Phone: [phone] Fax: [phone] ? ?[place] [place] - [address] ?[address] ?[place] [address] ?Phone: [phone] Fax: [phone] ? ? ? ? ?Social Determinants of Health (SDOH) Interventions ?  ? ?Readmission Risk Interventions ?   ? View : No data to display.  ?  ?  ?  ? ? ? ?
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
## 2021-10-23 NOTE — Consult Note (Signed)
[name] Psych ED Progress Note  10/23/2021 1:26 PM Scott Lawson Scott Lawson.  MRN:  [id number]   Method of visit?: Face to Face   Subjective:  "I am doing fine today"  Patient approached as he was in his room, sitting up on the bed.  He is alert and oriented to self and place.  He tells me that his granddaughter had just come in the room and paid all of her workers.  He states that she pulled out her billfold and paid them all.  This did not happen.   He goes on to tell me how smart his granddaughter is.  He is pleasant and cooperative. Smiling and joking with bedside RN. He does not appear to be responding to internal stimuli. Speech is clear, with linear sentences but expressed thoughts represent poor reality testing.  Patient states that he is ready to go home.  Denies any thoughts of harming himself or others.  He has memory impairment documented, and per his granddaughter that he had been told by his primary care physician that he has signs of dementia. Patient does not meet criteria for inpatient psychiatric hospitalization on the geriatric unit.  He would likely benefit from outpatient follow-up with his PCP regarding his memory/cognitive impairments.  Collateral from great-granddaughter,Scott Lawson [phone]): Patient had COVID several months ago and since then he came to live with Scott Lawson and her mother. He is non-compliant with his meds much of the time. The family was told that he has signs of "early dementia" by his doctor. Patient has never been very pleasant in his personality, but it is getting worse.   Collateral from grand-daughter, Scott Lawson, granddaughter [phone]): Scott Lawson states that he has been sometimes aggressive lately, difficult to re-direct. 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 Writer  explained to Scott Lawson that she should consider asking patient's PCP to prescribe medications that may help with his symptoms. Scott Lawson mentioned  that she may consider looking for placement in a facility for him at some point. Writer encouraged Scott Lawson to explore options.     Principal Problem: Aggressive behavior Diagnosis:  Principal Problem:   Aggressive behavior Active Problems:   Memory impairment   Chronic kidney disease, stage 3a ([place])   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HCC)   Atherosclerosis of aorta (HCC)   Interstitial lung disease ([place])   Emphysema lung (HCC)   Weight loss, unintentional   Chronic diastolic CHF (congestive heart failure) (HCC)   At high risk for falls  Total Time spent with patient: 15 minutes  Past Psychiatric History: none noted  Past Medical History:  Past Medical History:  Diagnosis Date   BPH (benign prostatic hyperplasia)    Diabetes mellitus without complication (HCC)    Elevated PSA    GERD (gastroesophageal reflux disease)    Hypertension    Hypertriglyceridemia    Nocturia     Past Surgical History:  Procedure Laterality Date   APPENDECTOMY     CARPAL TUNNEL RELEASE Left 12/11/2015   Procedure: CARPAL TUNNEL RELEASE;  Surgeon: Earnestine Leys, MD;  Location: [facility] ORS;  Service: Orthopedics;  Laterality: Left;   EYE SURGERY     laser   TRANSURETHRAL RESECTION OF PROSTATE N/A 08/02/2017   Procedure: TRANSURETHRAL RESECTION OF THE PROSTATE (TURP);  Surgeon: Hollice Espy, MD;  Location: [facility] ORS;  Service: Urology;  Laterality: N/A;   Family History:  Family History  Problem Relation Age of Onset  Cancer Mother        lung   Heart disease Father    Diabetes Brother    Prostate cancer Neg Hx    Bladder Cancer Neg Hx    Kidney cancer Neg Hx    Family Psychiatric  History: none reported Social History:  Social History   Substance and Sexual Activity  Alcohol Use No   Alcohol/week: 0.0 standard drinks     Social History   Substance and Sexual Activity  Drug Use No    Social History   Socioeconomic History   Marital status:  Married    Spouse name: Not on file   Number of children: Not on file   Years of education: Not on file   Highest education level: High school graduate  Occupational History   Occupation: retired  Tobacco Use   Smoking status: Never   Smokeless tobacco: Never  Vaping Use   Vaping Use: Never used  Substance and Sexual Activity   Alcohol use: No    Alcohol/week: 0.0 standard drinks   Drug use: No   Sexual activity: Yes  Other Topics Concern   Not on file  Social History Narrative   Not on file   Social Determinants of Health   Financial Resource Strain: Low Risk    Difficulty of Paying Living Expenses: Not hard at all  Food Insecurity: No Food Insecurity   Worried About Charity fundraiser in the Last Year: Never true   [place] in the Last Year: Never true  Transportation Needs: No Transportation Needs   Lack of Transportation (Medical): No   Lack of Transportation (Non-Medical): No  Physical Activity: Sufficiently Active   Days of Exercise per Week: 5 days   Minutes of Exercise per Session: 30 min  Stress: Not on file  Social Connections: Moderately Isolated   Frequency of Communication with Friends and Family: More than three times a week   Frequency of Social Gatherings with Friends and Family: Three times a week   Attends Religious Services: More than 4 times per year   Active Member of Clubs or Organizations: No   Attends Archivist Meetings: Never   Marital Status: Widowed    Sleep: Good  Appetite:  Fair  Current Medications: Current Facility-Administered Medications  Medication Dose Route Frequency Provider Last Rate Last Admin   albuterol (VENTOLIN HFA) 108 (90 Base) MCG/ACT inhaler 2 puff  2 puff Inhalation Q6H PRN Caroline Sauger, NP       aspirin EC tablet 81 mg  81 mg Oral Daily Caroline Sauger, NP       insulin aspart (novoLOG) injection 15 Units  15 Units Subcutaneous TID AC Caroline Sauger, NP       insulin  glargine-yfgn (SEMGLEE) injection 35 Units  35 Units Subcutaneous QPM Caroline Sauger, NP       metFORMIN (GLUCOPHAGE) tablet 1,000 mg  1,000 mg Oral BID WC Caroline Sauger, NP       tamsulosin (FLOMAX) capsule 0.4 mg  0.4 mg Oral Daily Caroline Sauger, NP       Current Outpatient Medications  Medication Sig Dispense Refill   albuterol (VENTOLIN HFA) 108 (90 Base) MCG/ACT inhaler Inhale 2 puffs into the lungs every 6 (six) hours as needed for wheezing or shortness of breath. 8 g 2   aspirin EC 81 MG tablet Take 81 mg by mouth daily. Swallow whole.     insulin glargine (LANTUS) 100 UNIT/ML injection Inject 0.35 mLs (35  Units total) into the skin every evening. 1800 10 mL 11   insulin lispro (HUMALOG KWIKPEN) 100 UNIT/ML KwikPen Inject 15 Units into the skin 3 (three) times daily before meals. Continue to check blood sugar after meals. 15 mL 5   metFORMIN (GLUCOPHAGE) 500 MG tablet Take 2 tablets (1,000 mg total) by mouth 2 (two) times daily with a meal. '1000mg'$  in the morning '1000mg'$  at night 360 tablet 4   tamsulosin (FLOMAX) 0.4 MG CAPS capsule TAKE 1 CAPSULE EVERY DAY 90 capsule 2   Insulin Pen Needle 29G X 12MM MISC 1 Units by Does not apply route daily. 100 each 1   INSULIN SYRINGE 1CC/29G 29G X 1/2" 1 ML MISC 1 Units by Does not apply route daily. 100 each 1   Omega-3 Fatty Acids (OMEGA 3 500 PO) Take 500 mg by mouth daily. (Patient not taking: Reported on 04/09/2021)     omeprazole (PRILOSEC) 20 MG capsule Take 1 capsule (20 mg total) by mouth daily. (Patient not taking: Reported on 04/09/2021) 90 capsule 0   TRUE METRIX BLOOD GLUCOSE TEST test strip CHECK BLOOD SUGAR TWICE DAILY (Patient not taking: Reported on 04/09/2021) 200 strip 3   TRUEplus Lancets 33G MISC CHECK BLOOD SUGAR TWICE DAILY (Patient not taking: Reported on 04/09/2021) 200 each 1    Lab Results:  Results for orders placed or performed during the hospital encounter of 10/22/21 (from the past 48 hour(s))   Basic metabolic panel     Status: Abnormal   Collection Time: 10/22/21  7:52 PM  Result Value Ref Range   Sodium 139 135 - 145 mmol/L   Potassium 4.8 3.5 - 5.1 mmol/L   Chloride 104 98 - 111 mmol/L   CO2 28 22 - 32 mmol/L   Glucose, Bld 342 (H) 70 - 99 mg/dL    Comment: Glucose reference range applies only to samples taken after fasting for at least 8 hours.   BUN 26 (H) 8 - 23 mg/dL   Creatinine, Ser 1.54 (H) 0.61 - 1.24 mg/dL   Calcium 9.3 8.9 - 10.3 mg/dL   GFR, Estimated 45 (L) >60 mL/min    Comment: (NOTE) Calculated using the CKD-EPI Creatinine Equation (2021)    Anion gap 7 5 - 15    Comment: Performed at [facility], [address] [place], [place] [address]  CBC     Status: None   Collection Time: 10/22/21  7:52 PM  Result Value Ref Range   WBC 5.3 4.0 - 10.5 K/uL   RBC 5.04 4.22 - 5.81 MIL/uL   Hemoglobin 14.9 13.0 - 17.0 g/dL   HCT 46.5 39.0 - 52.0 %   MCV 92.3 80.0 - 100.0 fL   MCH 29.6 26.0 - 34.0 pg   MCHC 32.0 30.0 - 36.0 g/dL   RDW 12.3 11.5 - 15.5 %   Platelets 222 150 - 400 K/uL   nRBC 0.0 0.0 - 0.2 %    Comment: Performed at [facility], [place], [place] [address]  Troponin I (High Sensitivity)     Status: Abnormal   Collection Time: 10/22/21  7:52 PM  Result Value Ref Range   Troponin I (High Sensitivity) 21 (H) <18 ng/L    Comment: (NOTE) Elevated high sensitivity troponin I (hsTnI) values and significant  changes across serial measurements may suggest ACS but many other  chronic and acute conditions are known to elevate hsTnI results.  Refer to the "Links" section for chest pain algorithms  and additional  guidance. Performed at [facility], [place], [place] [address]   Troponin I (High Sensitivity)     Status: Abnormal   Collection Time: 10/22/21 10:45 PM  Result Value Ref Range   Troponin I (High Sensitivity) 20 (H) <18 ng/L    Comment: (NOTE) Elevated high sensitivity troponin  I (hsTnI) values and significant  changes across serial measurements may suggest ACS but many other  chronic and acute conditions are known to elevate hsTnI results.  Refer to the "Links" section for chest pain algorithms and additional  guidance. Performed at [facility], [address] [place], [place] [address]   Urinalysis, Complete w Microscopic Urine, Clean Catch     Status: Abnormal   Collection Time: 10/22/21 10:45 PM  Result Value Ref Range   Color, Urine STRAW (A) YELLOW   APPearance CLEAR (A) CLEAR   Specific Gravity, Urine 1.022 1.005 - 1.030   pH 5.0 5.0 - 8.0   Glucose, UA >=500 (A) NEGATIVE mg/dL   Hgb urine dipstick NEGATIVE NEGATIVE   Bilirubin Urine NEGATIVE NEGATIVE   Ketones, ur NEGATIVE NEGATIVE mg/dL   Protein, ur NEGATIVE NEGATIVE mg/dL   Nitrite NEGATIVE NEGATIVE   Leukocytes,Ua NEGATIVE NEGATIVE   WBC, UA 0-5 0 - 5 WBC/hpf   Bacteria, UA NONE SEEN NONE SEEN   Squamous Epithelial / LPF NONE SEEN 0 - 5   Mucus PRESENT     Comment: Performed at [facility], [address] [place], [place] [address]  CBG monitoring, ED     Status: Abnormal   Collection Time: 10/23/21  7:21 AM  Result Value Ref Range   Glucose-Capillary 258 (H) 70 - 99 mg/dL    Comment: Glucose reference range applies only to samples taken after fasting for at least 8 hours.    Blood Alcohol level:  No results found for: [facility]  Physical Findings: AIMS:  , ,  ,  ,    CIWA:    COWS:     Musculoskeletal: Strength & Muscle Tone: within normal limits Gait & Station: normal Patient leans: N/A  Psychiatric Specialty Exam:  Presentation  General Appearance: Disheveled  Eye Contact:Good  Speech:Clear and Coherent  Speech Volume:Normal  Handedness:Right   Mood and Affect  Mood:Euthymic  Affect:Appropriate; Congruent   Thought Process  Thought Processes:Disorganized  Descriptions of Associations:Loose  Orientation:Partial  Thought  Content:Illogical  History of Schizophrenia/Schizoaffective disorder:No  Duration of Psychotic Symptoms:Less than six months  Hallucinations:Hallucinations: None  Ideas of Reference:None  Suicidal Thoughts:Suicidal Thoughts: No  Homicidal Thoughts:Homicidal Thoughts: No   Sensorium  Memory:Immediate Poor  Judgment:Impaired  Insight:Poor   Executive Functions  Concentration:Fair  Attention Span:Fair  Recall:Poor  Fund of Knowledge:Fair  Language:Fair   Psychomotor Activity  Psychomotor Activity:Psychomotor Activity: Normal   Assets  Assets:Housing; Catering manager; Social Support; Resilience   Sleep  Sleep:Sleep: Good    Physical Exam: Physical Exam Vitals and nursing note reviewed.  HENT:     Head: Normocephalic.     Nose: No congestion or rhinorrhea.  Pulmonary:     Effort: Pulmonary effort is normal.  Musculoskeletal:        General: Normal range of motion.     Cervical back: Normal range of motion.  Skin:    General: Skin is dry.  Neurological:     General: No focal deficit present.  Psychiatric:        Mood and Affect: Mood normal.   Review of Systems  Psychiatric/Behavioral:  Negative for depression, hallucinations, memory loss, substance abuse and suicidal ideas. The patient is not nervous/anxious and does not have insomnia.        Presents with memory impairment  Blood pressure 120/80, pulse (!) 116, temperature (P) 98.7 F (37.1 C), temperature source (P) Oral, resp. rate 15, height '5\' 9"'$  (1.753 m), weight 72.6 kg, SpO2 97 %. Body mass index is 23.63 kg/m.  Treatment Plan Summary: Patient's grand-daughter confirms that patient has a primary diagnosis of "early dementia" and it is getting to the point where she may start looking for placement for him. Writer recommended to grand-daughter that she seek assistance from patient's primary care provider  regarding medications that may help with his symptoms and advice regarding  possible placement. Reviewed with EDP. Return to ED if patient posing immediate danger to himself or others.  Sherlon Handing, NP 10/23/2021, 1:27 PM

## 2021-10-23 NOTE — ED Notes (Signed)
Pt's granddaughter arrived to take him home. RN provided discharge teaching and pt and family member verbalized understanding of information provided. Pt refuses vital signs. Unable to obtain signature at time of discharge due to signature pad not working.  ?

## 2021-10-24 ENCOUNTER — Telehealth: Payer: Self-pay

## 2021-10-24 NOTE — Telephone Encounter (Signed)
1215 pm.  Phone call made to [name]-granddaughter to schedule a home visit.  Scott Lawson has agreed to May 19th @ 1130 am.  ?

## 2021-10-25 ENCOUNTER — Emergency Department
Admission: EM | Admit: 2021-10-25 | Discharge: 2021-10-27 | Disposition: A | Discharge status: Home / Self Care | Payer: Medicare Other | Attending: Emergency Medicine | Admitting: Emergency Medicine

## 2021-10-25 ENCOUNTER — Encounter: Payer: Self-pay | Admitting: Emergency Medicine

## 2021-10-25 ENCOUNTER — Other Ambulatory Visit: Payer: Self-pay

## 2021-10-25 DIAGNOSIS — F29 Unspecified psychosis not due to a substance or known physiological condition: Secondary | ICD-10-CM | POA: Insufficient documentation

## 2021-10-25 DIAGNOSIS — F23 Brief psychotic disorder: Secondary | ICD-10-CM | POA: Diagnosis present

## 2021-10-25 DIAGNOSIS — R404 Transient alteration of awareness: Secondary | ICD-10-CM

## 2021-10-25 DIAGNOSIS — Z79899 Other long term (current) drug therapy: Secondary | ICD-10-CM | POA: Diagnosis not present

## 2021-10-25 DIAGNOSIS — F0390 Unspecified dementia without behavioral disturbance: Secondary | ICD-10-CM | POA: Diagnosis not present

## 2021-10-25 DIAGNOSIS — R4182 Altered mental status, unspecified: Secondary | ICD-10-CM | POA: Diagnosis present

## 2021-10-25 DIAGNOSIS — R456 Violent behavior: Secondary | ICD-10-CM | POA: Diagnosis present

## 2021-10-25 DIAGNOSIS — R4689 Other symptoms and signs involving appearance and behavior: Secondary | ICD-10-CM

## 2021-10-25 HISTORY — DX: Unspecified dementia, unspecified severity, without behavioral disturbance, psychotic disturbance, mood disturbance, and anxiety: F03.90

## 2021-10-25 LAB — CBC
HCT: 47.9 % (ref 39.0–52.0)
Hemoglobin: 15.6 g/dL (ref 13.0–17.0)
MCH: 29.7 pg (ref 26.0–34.0)
MCHC: 32.6 g/dL (ref 30.0–36.0)
MCV: 91.2 fL (ref 80.0–100.0)
Platelets: 262 10*3/uL (ref 150–400)
RBC: 5.25 MIL/uL (ref 4.22–5.81)
RDW: 12.4 % (ref 11.5–15.5)
WBC: 8.2 10*3/uL (ref 4.0–10.5)
nRBC: 0 % (ref 0.0–0.2)

## 2021-10-25 LAB — COMPREHENSIVE METABOLIC PANEL
ALT: 13 U/L (ref 0–44)
AST: 20 U/L (ref 15–41)
Albumin: 4.4 g/dL (ref 3.5–5.0)
Alkaline Phosphatase: 65 U/L (ref 38–126)
Anion gap: 13 (ref 5–15)
BUN: 26 mg/dL — ABNORMAL HIGH (ref 8–23)
CO2: 26 mmol/L (ref 22–32)
Calcium: 10.1 mg/dL (ref 8.9–10.3)
Chloride: 101 mmol/L (ref 98–111)
Creatinine, Ser: 1.4 mg/dL — ABNORMAL HIGH (ref 0.61–1.24)
GFR, Estimated: 51 mL/min — ABNORMAL LOW (ref >60)
Glucose, Bld: 299 mg/dL — ABNORMAL HIGH (ref 70–99)
Potassium: 3.7 mmol/L (ref 3.5–5.1)
Sodium: 140 mmol/L (ref 135–145)
Total Bilirubin: 1.7 mg/dL — ABNORMAL HIGH (ref 0.3–1.2)
Total Protein: 7.8 g/dL (ref 6.5–8.1)

## 2021-10-25 LAB — ACETAMINOPHEN LEVEL: Acetaminophen (Tylenol), Serum: <10 ug/mL — ABNORMAL LOW (ref 10–30)

## 2021-10-25 LAB — URINE DRUG SCREEN, QUALITATIVE (ARMC ONLY)
Amphetamines, Ur Screen: NOT DETECTED
Barbiturates, Ur Screen: NOT DETECTED
Benzodiazepine, Ur Scrn: NOT DETECTED
Cannabinoid 50 Ng, Ur ~~LOC~~: NOT DETECTED
Cocaine Metabolite,Ur ~~LOC~~: NOT DETECTED
MDMA (Ecstasy)Ur Screen: NOT DETECTED
Methadone Scn, Ur: NOT DETECTED
Opiate, Ur Screen: NOT DETECTED
Phencyclidine (PCP) Ur S: NOT DETECTED
Tricyclic, Ur Screen: NOT DETECTED

## 2021-10-25 LAB — ETHANOL: Alcohol, Ethyl (B): <10 mg/dL (ref <10)

## 2021-10-25 LAB — SALICYLATE LEVEL: Salicylate Lvl: <7 mg/dL — ABNORMAL LOW (ref 7.0–30.0)
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
## 2021-10-26 DIAGNOSIS — F23 Brief psychotic disorder: Secondary | ICD-10-CM | POA: Diagnosis present

## 2021-10-26 DIAGNOSIS — J479 Bronchiectasis, uncomplicated: Secondary | ICD-10-CM | POA: Diagnosis not present

## 2021-10-26 LAB — URINALYSIS, COMPLETE (UACMP) WITH MICROSCOPIC
Bacteria, UA: NONE SEEN
Bilirubin Urine: NEGATIVE
Glucose, UA: >=500 mg/dL — AB
Hgb urine dipstick: NEGATIVE
Ketones, ur: 5 mg/dL — AB
Leukocytes,Ua: NEGATIVE
Nitrite: NEGATIVE
Protein, ur: NEGATIVE mg/dL
Specific Gravity, Urine: 1.024 (ref 1.005–1.030)
WBC, UA: NONE SEEN WBC/hpf (ref 0–5)
pH: 5 (ref 5.0–8.0)

## 2021-10-26 LAB — CBG MONITORING, ED
Glucose-Capillary: 282 mg/dL — ABNORMAL HIGH (ref 70–99)
Glucose-Capillary: 224 mg/dL — ABNORMAL HIGH (ref 70–99)
Glucose-Capillary: 341 mg/dL — ABNORMAL HIGH (ref 70–99)
Glucose-Capillary: 170 mg/dL — ABNORMAL HIGH (ref 70–99)

## 2021-10-26 MED ORDER — INSULIN ASPART 100 UNIT/ML IJ SOLN: 0.0000 [IU] | Freq: Every day | INTRAMUSCULAR | Status: DC

## 2021-10-26 MED ORDER — INSULIN ASPART 100 UNIT/ML IJ SOLN
0.0000 [IU] | Freq: Three times a day (TID) | INTRAMUSCULAR | Status: DC
  Administered 2021-10-26: 11 [IU] via SUBCUTANEOUS
  Administered 2021-10-26: 8 [IU] via SUBCUTANEOUS
  Administered 2021-10-27: 5 [IU] via SUBCUTANEOUS
  Administered 2021-10-27: 3 [IU] via SUBCUTANEOUS
  Administered 2021-10-27: 8 [IU] via SUBCUTANEOUS
  Filled 2021-10-26 (×5): qty 1

## 2021-10-26 MED ORDER — ALUM & MAG HYDROXIDE-SIMETH 200-200-20 MG/5ML PO SUSP: 30.0000 mL | ORAL | Status: DC | PRN

## 2021-10-26 MED ORDER — ASPIRIN EC 81 MG PO TBEC
81.0000 mg | DELAYED_RELEASE_TABLET | Freq: Every day | ORAL | Status: DC
  Administered 2021-10-26 – 2021-10-27 (×2): 81 mg via ORAL
  Filled 2021-10-26 (×2): qty 1

## 2021-10-26 MED ORDER — INSULIN GLARGINE-YFGN 100 UNIT/ML ~~LOC~~ SOLN
35.0000 [IU] | Freq: Every day | SUBCUTANEOUS | Status: DC
  Administered 2021-10-26: 35 [IU] via SUBCUTANEOUS
  Filled 2021-10-26 (×2): qty 0.35

## 2021-10-26 MED ORDER — METFORMIN HCL 500 MG PO TABS
1000.0000 mg | ORAL_TABLET | Freq: Two times a day (BID) | ORAL | Status: DC
  Administered 2021-10-26 – 2021-10-27 (×4): 1000 mg via ORAL
  Filled 2021-10-26 (×4): qty 2

## 2021-10-26 MED ORDER — ACETAMINOPHEN 325 MG PO TABS
650.0000 mg | ORAL_TABLET | Freq: Four times a day (QID) | ORAL | Status: DC | PRN
Start: 2021-10-26 — End: 2021-10-28

## 2021-10-26 MED ORDER — MAGNESIUM HYDROXIDE 400 MG/5ML PO SUSP: 30.0000 mL | Freq: Every day | ORAL | Status: DC | PRN
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
## 2021-10-26 NOTE — ED Notes (Signed)
Breakfast has been given to pt, with a drink, vitals have been updated.  ?

## 2021-10-26 NOTE — Consult Note (Signed)
[facility] Psych ED Progress Note ? ?10/26/2021 12:55 PM ?Scott Lawson.  ?MRN:  [id number] ? ? ?Method of visit?: Face to Face  ? ?Subjective:  "I am here under false pretense. They do this by lying and bringing me here. They want what I got. I am at home by myself, and they came and said I have to come with them. If they stay off my property I dont have to worry about it.  " ? ?On evaluation patient is alert and oriented, calm and cooperative, very pleasant upon approach. He is jokingly and jovial, forwards much information at this time.   Patient denies any access to weapons, denies any alcohol and or substance abuse.  He reports moderate sleep and fair appetite.  He denies any outpatient psychiatric services at this time as he denies any mental health history. He denies any history of aggression, violence, homicidal tendencies, legal charges or probation.  Patient denies any auditory and/or visual hallucinations, does not appear to be responding to internal or external stimuli.  There is no evidence of delusional thought content and patient appears to answer all questions appropriately.  At this time patient appears to be stable to discharge home, with support system services in place.   ? ?Collateral not obtained today as patient was just in the ED 2 days ago, and was committed by his family for similar behaviors and manners. Patient with documented history of mild impairment, although he appears to be of sound mind on evaluation and has lucid and linear thought processes.  ? ? ?Principal Problem: Aggressive behavior ?Diagnosis:  Principal Problem: ?  Aggressive behavior ? ?Total Time spent with patient: 15 minutes ? ?Past Psychiatric History: none noted ? ?Past Medical History:  ?Past Medical History:  ?Diagnosis Date  ? BPH (benign prostatic hyperplasia)   ? Dementia ([place])   ? Diabetes mellitus without complication ([place])   ? Elevated PSA   ? GERD (gastroesophageal reflux disease)   ? Hypertension   ?  Hypertriglyceridemia   ? Nocturia   ?  ?Past Surgical History:  ?Procedure Laterality Date  ? APPENDECTOMY    ? CARPAL TUNNEL RELEASE Left 12/11/2015  ? Procedure: CARPAL TUNNEL RELEASE;  Surgeon: Earnestine Leys, MD;  Location: [facility] ORS;  Service: Orthopedics;  Laterality: Left;  ? EYE SURGERY    ? laser  ? TRANSURETHRAL RESECTION OF PROSTATE N/A 08/02/2017  ? Procedure: TRANSURETHRAL RESECTION OF THE PROSTATE (TURP);  Surgeon: Hollice Espy, MD;  Location: [facility] ORS;  Service: Urology;  Laterality: N/A;  ? ?Family History:  ?Family History  ?Problem Relation Age of Onset  ? Cancer Mother   ?     lung  ? Heart disease Father   ? Diabetes Brother   ? Prostate cancer Neg Hx   ? Bladder Cancer Neg Hx   ? Kidney cancer Neg Hx   ? ?Family Psychiatric  History: none reported ?Social History:  ?Social History  ? ?Substance and Sexual Activity  ?Alcohol Use No  ? Alcohol/week: 0.0 standard drinks  ?   ?Social History  ? ?Substance and Sexual Activity  ?Drug Use No  ?  ?Social History  ? ?Socioeconomic History  ? Marital status: Married  ?  Spouse name: Not on file  ? Number of children: Not on file  ? Years of education: Not on file  ? Highest education level: High school graduate  ?Occupational History  ? Occupation: retired  ?Tobacco Use  ? Smoking status: Never  ?  Smokeless tobacco: Never  ?Vaping Use  ? Vaping Use: Never used  ?Substance and Sexual Activity  ? Alcohol use: No  ?  Alcohol/week: 0.0 standard drinks  ? Drug use: No  ? Sexual activity: Yes  ?Other Topics Concern  ? Not on file  ?Social History Narrative  ? Not on file  ? ?Social Determinants of Health  ? ?Financial Resource Strain: Low Risk   ? Difficulty of Paying Living Expenses: Not hard at all  ?Food Insecurity: No Food Insecurity  ? Worried About Charity fundraiser in the Last Year: Never true  ? Ran Out of Food in the Last Year: Never true  ?Transportation Needs: No Transportation Needs  ? Lack of Transportation (Medical): No  ? Lack of Transportation  (Non-Medical): No  ?Physical Activity: Sufficiently Active  ? Days of Exercise per Week: 5 days  ? Minutes of Exercise per Session: 30 min  ?Stress: Not on file  ?Social Connections: Moderately Isolated  ? Frequency of Communication with Friends and Family: More than three times a week  ? Frequency of Social Gatherings with Friends and Family: Three times a week  ? Attends Religious Services: More than 4 times per year  ? Active Member of Clubs or Organizations: No  ? Attends Archivist Meetings: Never  ? Marital Status: Widowed  ? ? ?Sleep: Good ? ?Appetite:  Fair ? ?Current Medications: ?Current Facility-Administered Medications  ?Medication Dose Route Frequency Provider Last Rate Last Admin  ? aspirin EC tablet 81 mg  81 mg Oral Daily Lucrezia Starch, MD   81 mg at 10/26/21 0920  ? insulin aspart (novoLOG) injection 0-15 Units  0-15 Units Subcutaneous TID WC Lucrezia Starch, MD   8 Units at 10/26/21 7824  ? insulin aspart (novoLOG) injection 0-5 Units  0-5 Units Subcutaneous QHS Lucrezia Starch, MD      ? metFORMIN (GLUCOPHAGE) tablet 1,000 mg  1,000 mg Oral BID WC Lucrezia Starch, MD   1,000 mg at 10/26/21 0920  ? ?Current Outpatient Medications  ?Medication Sig Dispense Refill  ? albuterol (VENTOLIN HFA) 108 (90 Base) MCG/ACT inhaler Inhale 2 puffs into the lungs every 6 (six) hours as needed for wheezing or shortness of breath. 8 g 2  ? aspirin EC 81 MG tablet Take 81 mg by mouth daily. Swallow whole.    ? insulin glargine (LANTUS) 100 UNIT/ML injection Inject 0.35 mLs (35 Units total) into the skin every evening. 1800 10 mL 11  ? insulin lispro (HUMALOG KWIKPEN) 100 UNIT/ML KwikPen Inject 15 Units into the skin 3 (three) times daily before meals. Continue to check blood sugar after meals. 15 mL 5  ? Insulin Pen Needle 29G X 12MM MISC 1 Units by Does not apply route daily. 100 each 1  ? INSULIN SYRINGE 1CC/29G 29G X 1/2" 1 ML MISC 1 Units by Does not apply route daily. 100 each 1  ? metFORMIN  (GLUCOPHAGE) 500 MG tablet Take 2 tablets (1,000 mg total) by mouth 2 (two) times daily with a meal. '1000mg'$  in the morning '1000mg'$  at night 360 tablet 4  ? Omega-3 Fatty Acids (OMEGA 3 500 PO) Take 500 mg by mouth daily. (Patient not taking: Reported on 04/09/2021)    ? omeprazole (PRILOSEC) 20 MG capsule Take 1 capsule (20 mg total) by mouth daily. (Patient not taking: Reported on 04/09/2021) 90 capsule 0  ? tamsulosin (FLOMAX) 0.4 MG CAPS capsule TAKE 1 CAPSULE EVERY DAY 90 capsule 2  ?  TRUE METRIX BLOOD GLUCOSE TEST test strip CHECK BLOOD SUGAR TWICE DAILY (Patient not taking: Reported on 04/09/2021) 200 strip 3  ? TRUEplus Lancets 33G MISC CHECK BLOOD SUGAR TWICE DAILY (Patient not taking: Reported on 04/09/2021) 200 each 1  ? ? ?Lab Results:  ?Results for orders placed or performed during the hospital encounter of 10/25/21 (from the past 48 hour(s))  ?Comprehensive metabolic panel     Status: Abnormal  ? Collection Time: 10/25/21 10:07 PM  ?Result Value Ref Range  ? Sodium 140 135 - 145 mmol/L  ? Potassium 3.7 3.5 - 5.1 mmol/L  ? Chloride 101 98 - 111 mmol/L  ? CO2 26 22 - 32 mmol/L  ? Glucose, Bld 299 (H) 70 - 99 mg/dL  ?  Comment: Glucose reference range applies only to samples taken after fasting for at least 8 hours.  ? BUN 26 (H) 8 - 23 mg/dL  ? Creatinine, Ser 1.40 (H) 0.61 - 1.24 mg/dL  ? Calcium 10.1 8.9 - 10.3 mg/dL  ? Total Protein 7.8 6.5 - 8.1 g/dL  ? Albumin 4.4 3.5 - 5.0 g/dL  ? AST 20 15 - 41 U/L  ? ALT 13 0 - 44 U/L  ? Alkaline Phosphatase 65 38 - 126 U/L  ? Total Bilirubin 1.7 (H) 0.3 - 1.2 mg/dL  ? GFR, Estimated 51 (L) >60 mL/min  ?  Comment: (NOTE) ?Calculated using the CKD-EPI Creatinine Equation (2021) ?  ? Anion gap 13 5 - 15  ?  Comment: Performed at [facility], [address] [place], [place] [address]  ?Ethanol     Status: None  ? Collection Time: 10/25/21 10:07 PM  ?Result Value Ref Range  ? Alcohol, Ethyl (B) <10 <10 mg/dL  ?  Comment: (NOTE) ?Lowest detectable limit for  serum alcohol is 10 mg/dL. ? ?For medical purposes only. ?Performed at [facility], [place], ?[place] [address] ?  ?Salicylate level     Status: Abnormal  ? Collection Time: 10/25/21 10:

## 2021-10-26 NOTE — ED Provider Notes (Signed)
Patient cleared by psychiatry per report from outgoing provider, but pt unable to be cared for at home. Will need SNF placement. UA without UTI. TTS consulted at time of sign out. No acute issues during my shift. ?  ?Duffy Bruce, MD ?10/26/21 2014 ? ?

## 2021-10-26 NOTE — ED Notes (Signed)
IVC/pending psych consult 

## 2021-10-26 NOTE — ED Notes (Signed)
Pt declines offer for ear plugs and face mask to help sleep.  ?

## 2021-10-26 NOTE — ED Notes (Signed)
Psych cleared, needs memory care ?

## 2021-10-26 NOTE — ED Notes (Signed)
Pt given dinner tray and drink at this time. 

## 2021-10-26 NOTE — ED Notes (Signed)
Pt sitting in hall calm and cooperative ?

## 2021-10-27 DIAGNOSIS — N183 Chronic kidney disease, stage 3 unspecified: Secondary | ICD-10-CM | POA: Diagnosis not present

## 2021-10-27 DIAGNOSIS — U071 COVID-19: Secondary | ICD-10-CM | POA: Diagnosis not present

## 2021-10-27 LAB — CBG MONITORING, ED
Glucose-Capillary: 191 mg/dL — ABNORMAL HIGH (ref 70–99)
Glucose-Capillary: 177 mg/dL — ABNORMAL HIGH (ref 70–99)
Glucose-Capillary: 220 mg/dL — ABNORMAL HIGH (ref 70–99)
Glucose-Capillary: 305 mg/dL — ABNORMAL HIGH (ref 70–99)
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
## 2021-10-27 NOTE — ED Notes (Signed)
Snack provided with beverage  

## 2021-10-27 NOTE — ED Notes (Signed)
IVC  PAPERS  RESCINDED   PER  LOUISE  BARTHOLD  NP  INFORMED  ALLY  RN ?

## 2021-10-27 NOTE — ED Notes (Signed)
Peanut butter and cracker provided ?

## 2021-10-27 NOTE — ED Notes (Signed)
PT  VOL  PENDING  D/C ?

## 2021-10-27 NOTE — ED Notes (Addendum)
Occupational therapy ambulating with pt; security with pt ?

## 2021-10-27 NOTE — ED Notes (Signed)
Family arrived to  take pt home  d/c inst to familyv ?

## 2021-10-27 NOTE — ED Notes (Signed)
Great grand daughter signed esignature.  D/c inst to family and pt.  Pt alert.    ?

## 2021-10-27 NOTE — ED Notes (Addendum)
Pt given lunch tray.

## 2021-10-27 NOTE — ED Provider Notes (Signed)
----------------------------------------- ?  10:08 AM on 10/27/2021 ?----------------------------------------- ? ? ?Blood pressure 125/72, pulse 64, temperature 97.6 ?F (36.4 ?C), resp. rate 18, height 1.753 m ('5\' 9"'$ ), weight 72.6 kg, SpO2 96 %. ? ?The patient is calm and cooperative at this time.  There is seems to be some confusion because the patient was reportedly cleared by psychiatry, but Dr. Weber Cooks the psychiatrist is currently listed on his treatment team. ? ?I sent an in basket message through [facility] to Dr. Weber Cooks to ask for clarification and to see if psychiatry could reach out to the family for additional collateral information, because all upon the additional assessment by psychiatry during this ED visit, that process was not completed.  The patient is stable at this time for [facility] and/or psychiatry disposition plans. ?  ?Hinda Kehr, MD ?10/27/21 1008 ? ?

## 2021-10-27 NOTE — TOC Progression Note (Signed)
Transition of Care (TOC) - Progression Note  ? ? ?Patient Details  ?Name: Scott Lawson. ?MRN: [id number] ?Date of Birth: [birth year] ? ?Transition of Care (TOC) CM/SW Contact  ?Shelbie Hutching, RN ?Phone Number: ?10/27/2021, 11:05 AM ? ?Clinical Narrative:    ?Patient is psychiatrically cleared.  OT has worked with patient and he is at his baseline mobility wise.  Patient does not qualify for SNF.  RNCM has called granddaughter, Hildred Alamin and left a message for return call.  Patient is ready for discharge.   ? ? ?  ?  ? ?Expected Discharge Plan and Services ?  ?  ?  ?  ?  ?                ?  ?  ?  ?  ?  ?  ?  ?  ?  ?  ? ? ?Social Determinants of Health (SDOH) Interventions ?  ? ?Readmission Risk Interventions ?   ? View : No data to display.  ?  ?  ?  ? ? ?

## 2021-10-27 NOTE — ED Notes (Signed)
Dinner tray given

## 2021-10-27 NOTE — ED Notes (Signed)
Provided patient with shower supplies. Patient independently showered and changed into hospital provided scrubs.  ?

## 2021-10-27 NOTE — ED Notes (Signed)
Report to andrea, rn ?

## 2021-10-27 NOTE — ED Notes (Signed)
IVC/ Consult completed/ TOC needed  ?

## 2021-10-27 NOTE — ED Notes (Signed)
Breakfast tray given. °

## 2021-10-27 NOTE — TOC Progression Note (Signed)
Transition of Care (TOC) - Progression Note  ? ? ?Patient Details  ?Name: Scott Lawson. ?MRN: [id number] ?Date of Birth: 1941-05-08 ? ?Transition of Care (TOC) CM/SW Contact  ?Shelbie Hutching, RN ?Phone Number: ?10/27/2021, 2:38 PM ? ?Clinical Narrative:    ? ?RNCM spoke with patient's granddaughter Hildred Alamin via phone.  She will get patient picked up this afternoon, she says it will probably be a couple hours before she can get here.   ? ?  ?  ? ?Expected Discharge Plan and Services ?  ?  ?  ?  ?  ?                ?  ?  ?  ?  ?  ?  ?  ?  ?  ?  ? ? ?Social Determinants of Health (SDOH) Interventions ?  ? ?Readmission Risk Interventions ?   ? View : No data to display.  ?  ?  ?  ? ? ?

## 2021-10-27 NOTE — Discharge Instructions (Signed)
Please follow-up with your doctors.  Please continue taking your medicines.  Do not hesitate To return if need be. ?

## 2021-10-27 NOTE — Progress Notes (Signed)
Inpatient Diabetes Program Recommendations ? ?AACE/ADA: New Consensus Statement on Inpatient Glycemic Control (2015) ? ?Target Ranges:  Prepandial:   less than 140 mg/dL ?     Peak postprandial:   less than 180 mg/dL (1-2 hours) ?     Critically ill patients:  140 - 180 mg/dL  ? ?Lab Results  ?Component Value Date  ? GLUCAP 177 (H) 10/27/2021  ? HGBA1C >15.5 (H) 03/02/2021  ? ? ?Review of Glycemic Control ? Latest Reference Range & Units 10/26/21 17:05 10/26/21 21:34 10/27/21 07:52 10/27/21 09:49  ?Glucose-Capillary 70 - 99 mg/dL 341 (H) 170 (H) 191 (H) 177 (H)  ? ?Diabetes history: DM 2 ?Outpatient Diabetes medications: Lantus 35 units q PM, Humalog 15 units tid with meals, Metformin 1000 mg bid ?Current orders for Inpatient glycemic control:  ?Novolog moderate tid with meals and HS ?Semglee 35 units q HS ?Metformin 1000 mg bid ? ?Inpatient Diabetes Program Recommendations:   ? ?Please consider adding Novolog meal coverage 4 units (hold if patient eats less than 50% or NPO).  ? ?Thanks,  ?Adah Perl, RN, BC-ADM ?Inpatient Diabetes Coordinator ?Pager [phone]  (8a-5p) ? ? ? ?

## 2021-10-27 NOTE — Progress Notes (Signed)
[facility]) Hospital Liaison note: ? ?This patient is currently enrolled in [facility] outpatient-based Palliative Care. Will continue to follow for disposition. ? ?Please call with any outpatient palliative questions or concerns. ? ?Thank you, ?Lorelee Market, LPN ?[facility] Hospital Liaison ?[phone] ?

## 2021-10-29 ENCOUNTER — Telehealth: Payer: Self-pay | Admitting: *Deleted

## 2021-10-29 NOTE — Telephone Encounter (Signed)
Transition Care Management Unsuccessful Follow-up Telephone Call ? ?Date of discharge and from where:  [place] 10-27-2021 ? ?Attempts:  1st Attempt ? ?Reason for unsuccessful TCM follow-up call:  Left voice message ? ?  ?

## 2021-10-31 ENCOUNTER — Emergency Department
Admission: EM | Admit: 2021-10-31 | Discharge: 2021-11-11 | Disposition: A | Discharge status: Home / Self Care | Payer: Medicare Other | Attending: Student in an Organized Health Care Education/Training Program | Admitting: Student in an Organized Health Care Education/Training Program

## 2021-10-31 ENCOUNTER — Other Ambulatory Visit: Payer: Self-pay

## 2021-10-31 ENCOUNTER — Other Ambulatory Visit: Payer: Medicare Other | Admitting: Primary Care

## 2021-10-31 ENCOUNTER — Other Ambulatory Visit: Payer: Medicare Other

## 2021-10-31 VITALS — BP 100/60 | HR 100 | Temp 97.1°F | Wt 168.4 lb

## 2021-10-31 DIAGNOSIS — R451 Restlessness and agitation: Secondary | ICD-10-CM | POA: Insufficient documentation

## 2021-10-31 DIAGNOSIS — Z515 Encounter for palliative care: Secondary | ICD-10-CM

## 2021-10-31 DIAGNOSIS — R739 Hyperglycemia, unspecified: Secondary | ICD-10-CM | POA: Diagnosis not present

## 2021-10-31 DIAGNOSIS — F0390 Unspecified dementia without behavioral disturbance: Secondary | ICD-10-CM | POA: Diagnosis not present

## 2021-10-31 DIAGNOSIS — R4689 Other symptoms and signs involving appearance and behavior: Secondary | ICD-10-CM

## 2021-10-31 DIAGNOSIS — R413 Other amnesia: Secondary | ICD-10-CM

## 2021-10-31 LAB — CBC
HCT: 49.6 % (ref 39.0–52.0)
Hemoglobin: 16.4 g/dL (ref 13.0–17.0)
MCH: 30.5 pg (ref 26.0–34.0)
MCHC: 33.1 g/dL (ref 30.0–36.0)
MCV: 92.4 fL (ref 80.0–100.0)
Platelets: 230 10*3/uL (ref 150–400)
RBC: 5.37 MIL/uL (ref 4.22–5.81)
RDW: 12.2 % (ref 11.5–15.5)
WBC: 8.6 10*3/uL (ref 4.0–10.5)
nRBC: 0 % (ref 0.0–0.2)

## 2021-10-31 LAB — URINALYSIS, ROUTINE W REFLEX MICROSCOPIC
Bacteria, UA: NONE SEEN
Bilirubin Urine: NEGATIVE
Glucose, UA: >=500 mg/dL — AB
Hgb urine dipstick: NEGATIVE
Ketones, ur: NEGATIVE mg/dL
Leukocytes,Ua: NEGATIVE
Nitrite: NEGATIVE
Protein, ur: NEGATIVE mg/dL
Specific Gravity, Urine: 1.028 (ref 1.005–1.030)
pH: 5 (ref 5.0–8.0)

## 2021-10-31 LAB — BASIC METABOLIC PANEL
Anion gap: 10 (ref 5–15)
BUN: 20 mg/dL (ref 8–23)
CO2: 29 mmol/L (ref 22–32)
Calcium: 9.5 mg/dL (ref 8.9–10.3)
Chloride: 98 mmol/L (ref 98–111)
Creatinine, Ser: 1.25 mg/dL — ABNORMAL HIGH (ref 0.61–1.24)
GFR, Estimated: 58 mL/min — ABNORMAL LOW (ref >60)
Glucose, Bld: 395 mg/dL — ABNORMAL HIGH (ref 70–99)
Potassium: 4.3 mmol/L (ref 3.5–5.1)
Sodium: 137 mmol/L (ref 135–145)

## 2021-10-31 LAB — CBG MONITORING, ED
Glucose-Capillary: 409 mg/dL — ABNORMAL HIGH (ref 70–99)
Glucose-Capillary: 392 mg/dL — ABNORMAL HIGH (ref 70–99)
Glucose-Capillary: 284 mg/dL — ABNORMAL HIGH (ref 70–99)

## 2021-10-31 MED ORDER — INSULIN GLARGINE-YFGN 100 UNIT/ML ~~LOC~~ SOLN
35.0000 [IU] | Freq: Every day | SUBCUTANEOUS | Status: DC
  Administered 2021-10-31 – 2021-11-10 (×11): 35 [IU] via SUBCUTANEOUS
  Filled 2021-10-31 (×12): qty 0.35

## 2021-10-31 MED ORDER — INSULIN ASPART 100 UNIT/ML IJ SOLN
8.0000 [IU] | Freq: Once | INTRAMUSCULAR | Status: AC
  Administered 2021-10-31: 8 [IU] via SUBCUTANEOUS
  Filled 2021-10-31: qty 1

## 2021-10-31 MED ORDER — ASPIRIN 81 MG PO TBEC
81.0000 mg | DELAYED_RELEASE_TABLET | Freq: Every day | ORAL | Status: DC
  Administered 2021-10-31 – 2021-11-11 (×12): 81 mg via ORAL
  Filled 2021-10-31 (×12): qty 1

## 2021-10-31 NOTE — ED Provider Triage Note (Signed)
Emergency Medicine Provider Triage Evaluation Note  Scott Roof., a 81 y.o. male  was evaluated in triage.  Pt complains of hyperglycemia.  Patient presents to the ED from [place].  Patient presents via EMS with reports of elevated blood sugars.  Patient apparently not had his medications today.  EMS reports CBG of 474.  Patient without complaint at this time.  Review of Systems  Positive: hyperglycemia Negative: NVD  Physical Exam  BP 121/70 (BP Location: Left Arm)   Pulse 88   Temp 98.1 F (36.7 C) (Oral)   Resp 20   Ht '5\' 9"'$  (1.753 m)   Wt 76 kg   SpO2 96%   BMI 24.74 kg/m  Gen:   Awake, no distress   Resp:  Normal effort  MSK:   Moves extremities without difficulty  Other:  RRR  Medical Decision Making  Medically screening exam initiated at 3:44 PM.  Appropriate orders placed.  Scott Bruins Hamb[place] [name] was informed that the remainder of the evaluation will be completed by another provider, this initial triage assessment does not replace that evaluation, and the importance of remaining in the ED until their evaluation is complete.  Presents to the ED with complaints of hyperglycemia.  Patient not had his home medicines today according to EMS report.   Scott Needles, PA-C 10/31/21 1545
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
## 2021-10-31 NOTE — ED Notes (Signed)
CBG 284
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
## 2021-10-31 NOTE — ED Notes (Signed)
Pt to bathroom with minimal assistance. NAD noted

## 2021-10-31 NOTE — Progress Notes (Signed)
[facility]) Hospital Liaison note:  This patient is currently enrolled in [facility] outpatient-based Palliative Care. Will continue to follow for disposition.  Please call with any outpatient palliative questions or concerns.  Thank you, Lorelee Market, LPN [facility] Liaison [phone]

## 2021-10-31 NOTE — ED Notes (Signed)
Pt given sandwich tray 
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
## 2021-10-31 NOTE — ED Triage Notes (Signed)
Patient arrived by EMS from [facility] for hyperglycemia. Patient has not had his medications today. Denies complaints. EMS vitals CBG 474, 130/78 b/p, 78HR

## 2021-10-31 NOTE — ED Notes (Addendum)
Pt given warm blankets.

## 2021-11-01 DIAGNOSIS — R413 Other amnesia: Secondary | ICD-10-CM | POA: Diagnosis not present

## 2021-11-01 LAB — CBG MONITORING, ED
Glucose-Capillary: 459 mg/dL — ABNORMAL HIGH (ref 70–99)
Glucose-Capillary: 432 mg/dL — ABNORMAL HIGH (ref 70–99)

## 2021-11-01 MED ORDER — INSULIN ASPART 100 UNIT/ML IJ SOLN
0.0000 [IU] | Freq: Three times a day (TID) | INTRAMUSCULAR | Status: DC
  Administered 2021-11-02: 2 [IU] via SUBCUTANEOUS
  Administered 2021-11-02: 1 [IU] via SUBCUTANEOUS
  Administered 2021-11-02: 2 [IU] via SUBCUTANEOUS
  Administered 2021-11-03: 7 [IU] via SUBCUTANEOUS
  Administered 2021-11-03: 3 [IU] via SUBCUTANEOUS
  Administered 2021-11-03: 1 [IU] via SUBCUTANEOUS
  Administered 2021-11-04: 3 [IU] via SUBCUTANEOUS
  Administered 2021-11-04: 2 [IU] via SUBCUTANEOUS
  Administered 2021-11-05: 3 [IU] via SUBCUTANEOUS
  Administered 2021-11-05: 5 [IU] via SUBCUTANEOUS
  Administered 2021-11-06: 2 [IU] via SUBCUTANEOUS
  Administered 2021-11-06 – 2021-11-07 (×2): 3 [IU] via SUBCUTANEOUS
  Administered 2021-11-07: 5 [IU] via SUBCUTANEOUS
  Administered 2021-11-08 (×3): 3 [IU] via SUBCUTANEOUS
  Administered 2021-11-09: 2 [IU] via SUBCUTANEOUS
  Administered 2021-11-09: 5 [IU] via SUBCUTANEOUS
  Administered 2021-11-10: 1 [IU] via SUBCUTANEOUS
  Administered 2021-11-10: 2 [IU] via SUBCUTANEOUS
  Administered 2021-11-10: 9 [IU] via SUBCUTANEOUS
  Administered 2021-11-11: 5 [IU] via SUBCUTANEOUS
  Filled 2021-11-01 (×22): qty 1

## 2021-11-01 MED ORDER — INSULIN LISPRO (1 UNIT DIAL) 100 UNIT/ML (KWIKPEN): 15.0000 [IU] | PEN_INJECTOR | Freq: Three times a day (TID) | SUBCUTANEOUS | Status: DC

## 2021-11-01 MED ORDER — INSULIN ASPART 100 UNIT/ML IJ SOLN: 15.0000 [IU] | Freq: Three times a day (TID) | INTRAMUSCULAR | Status: DC

## 2021-11-01 MED ORDER — INSULIN ASPART 100 UNIT/ML IJ SOLN
10.0000 [IU] | Freq: Once | INTRAMUSCULAR | Status: AC
  Administered 2021-11-01: 10 [IU] via SUBCUTANEOUS
  Filled 2021-11-01: qty 1

## 2021-11-01 MED ORDER — INSULIN ASPART 100 UNIT/ML IJ SOLN
15.0000 [IU] | Freq: Three times a day (TID) | INTRAMUSCULAR | Status: DC
  Administered 2021-11-01 – 2021-11-06 (×13): 15 [IU] via SUBCUTANEOUS
  Filled 2021-11-01 (×14): qty 1

## 2021-11-01 NOTE — Consult Note (Deleted)
Client psych cleared on 5/14 and SNF placement being sought related to early dementia with aggression at times, per family.  Waylan Boga, PMHNP
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
## 2021-11-01 NOTE — ED Notes (Signed)
Breakfast tray given at this time.  

## 2021-11-02 LAB — CBG MONITORING, ED
Glucose-Capillary: 173 mg/dL — ABNORMAL HIGH (ref 70–99)
Glucose-Capillary: 186 mg/dL — ABNORMAL HIGH (ref 70–99)
Glucose-Capillary: 146 mg/dL — ABNORMAL HIGH (ref 70–99)
Glucose-Capillary: 186 mg/dL — ABNORMAL HIGH (ref 70–99)
Glucose-Capillary: 213 mg/dL — ABNORMAL HIGH (ref 70–99)

## 2021-11-02 NOTE — ED Notes (Signed)
VOL / pending TOC placement 
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
## 2021-11-02 NOTE — ED Notes (Signed)
Granddaughter informed RN that he was supposed to be going to a group home in [place] but social work is unaware of this. Granddaughter requested that he call her after she gets off work at [employer] today. Pt updated that family will be unable to come get pt today and accepted this. Given lunch tray and ate 100%. Denies further needs.

## 2021-11-02 NOTE — ED Notes (Signed)
Left message with granddaughter with pt permission.

## 2021-11-02 NOTE — ED Notes (Signed)
Pt spoke with granddaughter on the phone.

## 2021-11-02 NOTE — TOC Progression Note (Addendum)
Transition of Care (TOC) - Progression Note    Patient Details  Name: Scott Lawson. MRN: [id number] Date of Birth: 07/03/1940  Transition of Care [facility]) CM/SW [place], [place] Phone Number: 11/02/2021, 11:52 AM  Clinical Narrative:     CSW spoke with nursing staff. The patient is requesting to go home. Nursing staff wanted to confirm whether the patient can discharge home.  CSW spoke to on-call [place] [phone]). She confirmed they have no legal authority to hold the patient if he has been cleared. She also requested that CSW let her know if the patient is discharged today.        Expected Discharge Plan and Services                                                 Social Determinants of Health (SDOH) Interventions    Readmission Risk Interventions     View : No data to display.

## 2021-11-03 LAB — CBG MONITORING, ED
Glucose-Capillary: 309 mg/dL — ABNORMAL HIGH (ref 70–99)
Glucose-Capillary: 247 mg/dL — ABNORMAL HIGH (ref 70–99)
Glucose-Capillary: 138 mg/dL — ABNORMAL HIGH (ref 70–99)
Glucose-Capillary: 123 mg/dL — ABNORMAL HIGH (ref 70–99)

## 2021-11-03 NOTE — ED Notes (Signed)
Resumed care from bill rn.  Pt sitting in recliner, waiting on family to pick up pt for discharge.    Pt alert.

## 2021-11-03 NOTE — ED Notes (Signed)
Mallie Mussel son in law [phone]

## 2021-11-03 NOTE — ED Notes (Signed)
Fsbs 123
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
## 2021-11-03 NOTE — ED Notes (Addendum)
Per jeanna creech sw, pt will be here tonight.   Pt not going home, family does not want pt back .  Dr Starleen Blue aware.  Pt alert.

## 2021-11-03 NOTE — ED Notes (Signed)
Pt given a snack 

## 2021-11-03 NOTE — ED Notes (Signed)
Pt awake and eating breakfast. Extra coffee given per request. Pt setting in chair - no complaints or needs at this time.

## 2021-11-03 NOTE — ED Notes (Signed)
Multiple calls attempted to pts family with no answer or call back. Per front desk family came in and refused to come back.
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
## 2021-11-03 NOTE — ED Notes (Signed)
Fsbs 138

## 2021-11-03 NOTE — TOC Progression Note (Signed)
Transition of Care (TOC) - Progression Note    Patient Details  Name: Scott Lawson. MRN: [id number] Date of Birth: 02/10/1941  Transition of Care [facility]) CM/SW Contact  Shelbie Hutching, RN Phone Number: 11/03/2021, 10:42 AM  Clinical Narrative:    [place] DSS does not have a protective order, patient can be discharged home.  DSS can follow up outpatient.          Expected Discharge Plan and Services                                                 Social Determinants of Health (SDOH) Interventions    Readmission Risk Interventions     View : No data to display.

## 2021-11-03 NOTE — ED Notes (Signed)
PER PTS RN AMY, FAMILY IN ROUTE TO PICK PT UP AND TAKE HIM HOME.

## 2021-11-03 NOTE — ED Provider Notes (Signed)
-----------------------------------------   5:51 AM on 11/03/2021 -----------------------------------------   Blood pressure 105/66, pulse 62, temperature 98.1 F (36.7 C), temperature source Oral, resp. rate 18, height '5\' 9"'$  (1.753 m), weight 76 kg, SpO2 96 %.  The patient is calm and cooperative at this time.  There have been no acute events since the last update.  Awaiting disposition plan from Social Work team.   Paulette Blanch, MD 11/03/21 [phone]

## 2021-11-03 NOTE — ED Notes (Signed)
Meds given  pt sitting in a recliner.  Pt alert  skin warm and dry.

## 2021-11-03 NOTE — ED Notes (Signed)
Pt given breakfast tray and drink at this time. 

## 2021-11-03 NOTE — Discharge Instructions (Addendum)
Please follow-up with his doctors.  He can see the [place] doctors in [place] that would be great.  I have prescribed his insulin.  Please follow that carefully.  Please make sure you get a glucometer with the alcohol wipes and lancets to use to check his glucose after every meal and before bed.  I would keep a written record of that for the time being.  You want to keep his fingerstick results between 80 and 200 for the time being.  If his sugars get over 280 please go to the nearest emergency room. We will have the nurse go over how to use the insulin syringe before you leave.

## 2021-11-03 NOTE — ED Provider Notes (Signed)
Patient cleared by SW as well. He is requesting d/c. He is alert, oriented on my interview. He reportedly typically goes home with granddaughter and she has been called to come pick him up.    Duffy Bruce, MD 11/03/21 [phone]

## 2021-11-03 NOTE — ED Notes (Signed)
Pt asleep.

## 2021-11-03 NOTE — ED Notes (Signed)
Pt alert, sitting in recliner watching tv.

## 2021-11-04 DIAGNOSIS — J9601 Acute respiratory failure with hypoxia: Secondary | ICD-10-CM | POA: Diagnosis not present

## 2021-11-04 DIAGNOSIS — U071 COVID-19: Secondary | ICD-10-CM | POA: Diagnosis not present

## 2021-11-04 LAB — CBG MONITORING, ED
Glucose-Capillary: 276 mg/dL — ABNORMAL HIGH (ref 70–99)
Glucose-Capillary: 172 mg/dL — ABNORMAL HIGH (ref 70–99)
Glucose-Capillary: 66 mg/dL — ABNORMAL LOW (ref 70–99)
Glucose-Capillary: 133 mg/dL — ABNORMAL HIGH (ref 70–99)
Glucose-Capillary: 248 mg/dL — ABNORMAL HIGH (ref 70–99)
Glucose-Capillary: 221 mg/dL — ABNORMAL HIGH (ref 70–99)

## 2021-11-04 NOTE — ED Notes (Signed)
Fsbs 221

## 2021-11-04 NOTE — ED Notes (Signed)
Pt given meal tray.

## 2021-11-04 NOTE — ED Notes (Signed)
CBG was 66. Pt provided with orange juice and graham crackers, PB which he is consuming now. No acute alteration in mental status.

## 2021-11-04 NOTE — ED Notes (Signed)
Tried to call granddaughter of pt on his behalf to let him talk to her. No answer, left message.
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
## 2021-11-04 NOTE — ED Notes (Signed)
Pt given snack and drink 

## 2021-11-04 NOTE — ED Notes (Signed)
Pt sitting in recliner. Provided black coffee per request.

## 2021-11-04 NOTE — ED Notes (Signed)
Pt eating breakfast 

## 2021-11-04 NOTE — ED Notes (Signed)
Pt provided a cup of coffee - no other needs expressed at this time.

## 2021-11-04 NOTE — ED Notes (Signed)
Breakfast tray given. °

## 2021-11-04 NOTE — ED Notes (Signed)
Pt given a snack 

## 2021-11-04 NOTE — ED Provider Notes (Signed)
Vitals:   11/03/21 1745 11/04/21 0700  BP: (!) 103/56 110/66  Pulse: (!) 50 62  Resp: 20 18  Temp: 98.3 F (36.8 C)   SpO2: 96% 98%      Awaiting disposition plan from Social Work team.   Merlyn Lot, MD 11/04/21 [phone]

## 2021-11-04 NOTE — ED Notes (Signed)
Pt sleeping in a recliner

## 2021-11-04 NOTE — NC FL2 (Signed)
[place] LEVEL OF CARE SCREENING TOOL     IDENTIFICATION  Patient Name: Scott Lawson. Birthdate: 1941/06/07 Sex: male Admission Date (Current Location): 10/31/2021  [place] and [place] Number:  Engineering geologist and Address:  [facility], [address] [place], [place] [address]      Provider Number: [id number]  Attending Physician Name and Address:  No att. providers found  Relative Name and Phone Number:  [place]-  [phone]    Current Level of Care: Other (Comment) (ED boarder) Recommended Level of Care: [place], Memory Care Prior Approval Number:    Date Approved/Denied:   PASRR Number:    Discharge Plan: Domiciliary (Rest home) (ALF/memory care)    Current Diagnoses: Patient Active Problem List   Diagnosis Date Noted   Aggressive behavior 10/23/2021   Memory impairment 03/13/2021   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[place]) 02/04/2021   Uncontrolled type 2 diabetes mellitus with hyperosmolar nonketotic hyperglycemia ([place]) 02/04/2021   Chronic diastolic CHF (congestive heart failure) ([place]) 02/04/2021   Emphysema lung ([place]) 10/26/2020   History of 2019 novel coronavirus disease (COVID-19) 10/26/2020   Interstitial lung disease ([place]) 08/24/2020   Chronic respiratory failure with hypoxia ([place]) 06/20/2020   Atherosclerosis of aorta ([place]) 10/12/2019   Long-term insulin use ([place]) 10/12/2019   Vitamin B12 deficiency 06/28/2018   Prostate cancer ([place]) 06/27/2018   Psoriasis 08/16/2017   Advanced care planning/counseling discussion 12/01/2016   GERD without esophagitis 08/14/2015   Uncontrolled type 2 diabetes mellitus with chronic kidney disease 08/13/2015   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[place]) 08/13/2015   Chronic kidney disease, stage 3a ([place]) 02/11/2015   Mixed hyperlipidemia due to type 2 diabetes mellitus ([place]) 02/11/2015   BPH (benign prostatic hyperplasia)  02/11/2015    Orientation RESPIRATION BLADDER Height & Weight     Self, Place  Normal Continent Weight: 76 kg Height:  '5\' 9"'$  (175.3 cm)  BEHAVIORAL SYMPTOMS/MOOD NEUROLOGICAL BOWEL NUTRITION STATUS      Continent Diet (Regular)  AMBULATORY STATUS COMMUNICATION OF NEEDS Skin   Independent Verbally Normal                       Personal Care Assistance Level of Assistance  Bathing, Feeding, Dressing Bathing Assistance: Independent Feeding assistance: Independent Dressing Assistance: Independent     Functional Limitations Info  Sight, Hearing, Speech Sight Info: Adequate Hearing Info: Adequate Speech Info: Adequate    SPECIAL CARE FACTORS FREQUENCY                       Contractures Contractures Info: Not present    Additional Factors Info  Code Status, Allergies Code Status Info: Full Allergies Info: gabapentin           Current Medications (11/04/2021):  This is the current hospital active medication list Current Facility-Administered Medications  Medication Dose Route Frequency Provider Last Rate Last Admin   aspirin EC tablet 81 mg  81 mg Oral Daily Merlyn Lot, MD   81 mg at 11/04/21 0805   insulin aspart (novoLOG) injection 0-9 Units  0-9 Units Subcutaneous TID WC Carrie Mew, MD   2 Units at 11/04/21 0806   insulin aspart (novoLOG) injection 15 Units  15 Units Subcutaneous TID WC Carrie Mew, MD   15 Units at 11/04/21 0806   insulin glargine-yfgn (SEMGLEE) injection 35 Units  35 Units Subcutaneous QHS Merlyn Lot, MD  35 Units at 11/03/21 2206   Current Outpatient Medications  Medication Sig Dispense Refill   albuterol (VENTOLIN HFA) 108 (90 Base) MCG/ACT inhaler Inhale 2 puffs into the lungs every 6 (six) hours as needed for wheezing or shortness of breath. 8 g 2   aspirin EC 81 MG tablet Take 81 mg by mouth daily. Swallow whole.     insulin glargine (LANTUS) 100 UNIT/ML injection Inject 0.35 mLs (35 Units total) into the  skin every evening. 1800 10 mL 11   insulin lispro (HUMALOG KWIKPEN) 100 UNIT/ML KwikPen Inject 15 Units into the skin 3 (three) times daily before meals. Continue to check blood sugar after meals. 15 mL 5   Insulin Pen Needle 29G X 12MM MISC 1 Units by Does not apply route daily. 100 each 1   INSULIN SYRINGE 1CC/29G 29G X 1/2" 1 ML MISC 1 Units by Does not apply route daily. 100 each 1   metFORMIN (GLUCOPHAGE) 500 MG tablet Take 2 tablets (1,000 mg total) by mouth 2 (two) times daily with a meal. '1000mg'$  in the morning '1000mg'$  at night (Patient not taking: Reported on 10/27/2021) 360 tablet 4   Omega-3 Fatty Acids (OMEGA 3 500 PO) Take 500 mg by mouth daily. (Patient not taking: Reported on 04/09/2021)     omeprazole (PRILOSEC) 20 MG capsule Take 1 capsule (20 mg total) by mouth daily. (Patient not taking: Reported on 04/09/2021) 90 capsule 0   tamsulosin (FLOMAX) 0.4 MG CAPS capsule TAKE 1 CAPSULE EVERY DAY (Patient not taking: Reported on 10/27/2021) 90 capsule 2   TRUE METRIX BLOOD GLUCOSE TEST test strip CHECK BLOOD SUGAR TWICE DAILY (Patient not taking: Reported on 04/09/2021) 200 strip 3   TRUEplus Lancets 33G MISC CHECK BLOOD SUGAR TWICE DAILY (Patient not taking: Reported on 04/09/2021) 200 each 1     Discharge Medications: Please see discharge summary for a list of discharge medications.  Relevant Imaging Results:  Relevant Lab Results:   Additional Information [id number]  Shelbie Hutching, RN

## 2021-11-04 NOTE — ED Notes (Signed)
Provided diet sprite with ice. Pt resting in recliner. Asking for granddaughter.

## 2021-11-04 NOTE — ED Notes (Signed)
This rn responded to call light going off. Pt sitting in recliner, denying any needs at this time. Pt sitting comfortably, pt has call light within reach. Pt smiling and thanking this nurse for checking on them

## 2021-11-05 LAB — CBG MONITORING, ED
Glucose-Capillary: 88 mg/dL (ref 70–99)
Glucose-Capillary: 227 mg/dL — ABNORMAL HIGH (ref 70–99)
Glucose-Capillary: 276 mg/dL — ABNORMAL HIGH (ref 70–99)
Glucose-Capillary: 208 mg/dL — ABNORMAL HIGH (ref 70–99)

## 2021-11-05 MED ORDER — ACETAMINOPHEN 500 MG PO TABS
1000.0000 mg | ORAL_TABLET | Freq: Four times a day (QID) | ORAL | Status: DC | PRN
Start: 2021-11-05 — End: 2021-11-11
  Administered 2021-11-05 – 2021-11-07 (×2): 1000 mg via ORAL
  Filled 2021-11-05 (×3): qty 2

## 2021-11-05 MED ORDER — LIDOCAINE 5 % EX PTCH
1.0000 | MEDICATED_PATCH | CUTANEOUS | Status: DC
  Administered 2021-11-05 – 2021-11-08 (×4): 1 via TRANSDERMAL
  Filled 2021-11-05 (×6): qty 1

## 2021-11-05 NOTE — ED Provider Notes (Signed)
Today's Vitals   11/04/21 0700 11/04/21 1326 11/04/21 1634 11/04/21 1955  BP: 110/66  119/61 107/60  Pulse: 62  77 69  Resp: '18  18 18  '$ Temp:    98.2 F (36.8 C)  TempSrc:    Oral  SpO2: 98%  96% 98%  Weight:      Height:      PainSc:  Asleep     Body mass index is 24.74 kg/m.   No acute events overnight.  Awaiting social work disposition.   Jlon Betker, Delice Bison, DO 11/05/21 [phone]

## 2021-11-05 NOTE — ED Notes (Signed)
Pt given water 

## 2021-11-05 NOTE — ED Notes (Signed)
Breakfast tray given at this time. Pt sitting up right in recliner to eat.
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
## 2021-11-06 ENCOUNTER — Telehealth: Payer: Self-pay | Admitting: Nurse Practitioner

## 2021-11-06 LAB — CBG MONITORING, ED
Glucose-Capillary: 184 mg/dL — ABNORMAL HIGH (ref 70–99)
Glucose-Capillary: 225 mg/dL — ABNORMAL HIGH (ref 70–99)
Glucose-Capillary: 65 mg/dL — ABNORMAL LOW (ref 70–99)
Glucose-Capillary: 216 mg/dL — ABNORMAL HIGH (ref 70–99)
Glucose-Capillary: 309 mg/dL — ABNORMAL HIGH (ref 70–99)

## 2021-11-06 MED ORDER — INSULIN ASPART 100 UNIT/ML IJ SOLN
12.0000 [IU] | Freq: Three times a day (TID) | INTRAMUSCULAR | Status: DC
  Administered 2021-11-07 – 2021-11-11 (×11): 12 [IU] via SUBCUTANEOUS
  Filled 2021-11-06 (×10): qty 1
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
## 2021-11-06 NOTE — ED Notes (Signed)
Pt given meal tray.

## 2021-11-06 NOTE — ED Notes (Signed)
Pt given towel and wash cloth to clean up. New scrubs given at this time.

## 2021-11-06 NOTE — ED Notes (Signed)
CBG 65, holding sliding scale insulin and 15 units. EDP Joni Fears made aware. Pt given juice and meal tray. Will reassess. No complaints from pt.

## 2021-11-06 NOTE — Telephone Encounter (Signed)
FL2 form in folder

## 2021-11-06 NOTE — ED Provider Notes (Signed)
-----------------------------------------   2:58 AM on 11/06/2021 -----------------------------------------   Blood pressure (!) 106/55, pulse 98, temperature 98.2 F (36.8 C), temperature source Oral, resp. rate 18, height 1.753 m ('5\' 9"'$ ), weight 76 kg, SpO2 99 %.  The patient is calm and cooperative at this time, currently sleeping.  There have been no acute events since the last update.  Awaiting disposition plan from [facility] team.   Hinda Kehr, MD 11/06/21 [phone]

## 2021-11-06 NOTE — TOC Progression Note (Signed)
Transition of Care (TOC) - Progression Note    Patient Details  Name: Scott Lawson. MRN: [id number] Date of Birth: [birth year]  Transition [facility]) CM/SW Contact  Shelbie Hutching, RN Phone Number: 11/06/2021, 11:55 AM  Clinical Narrative:    RNCM spoke with Steffanie Dunn with [place] DSS, she is working on the guardianship papers and getting the protective order.  She is working on [employer] application and placement.     Expected Discharge Plan: Assisted Living Barriers to Discharge: Unsafe home situation  Expected Discharge Plan and Services Expected Discharge Plan: Assisted Living   Discharge Planning Services: CM Consult   Living arrangements for the past 2 months: Single Family Home                 DME Arranged: N/A DME Agency: NA       HH Arranged: NA HH Agency: NA         Social Determinants of Health (SDOH) Interventions    Readmission Risk Interventions     View : No data to display.

## 2021-11-06 NOTE — Telephone Encounter (Signed)
Called APS worked at [employer], no answer and voicemail box full.  Will try in morning again.

## 2021-11-07 LAB — CBG MONITORING, ED
Glucose-Capillary: 202 mg/dL — ABNORMAL HIGH (ref 70–99)
Glucose-Capillary: 120 mg/dL — ABNORMAL HIGH (ref 70–99)
Glucose-Capillary: 261 mg/dL — ABNORMAL HIGH (ref 70–99)
Glucose-Capillary: 286 mg/dL — ABNORMAL HIGH (ref 70–99)

## 2021-11-07 NOTE — ED Notes (Signed)
Pt given milk as requested. Denies any other needs at this time.

## 2021-11-07 NOTE — Progress Notes (Signed)
Inpatient Diabetes Program Recommendations  AACE/ADA: New Consensus Statement on Inpatient Glycemic Control (2015)  Target Ranges:  Prepandial:   less than 140 mg/dL      Peak postprandial:   less than 180 mg/dL (1-2 hours)      Critically ill patients:  140 - 180 mg/dL   Lab Results  Component Value Date   GLUCAP 202 (H) 11/07/2021   HGBA1C >15.5 (H) 03/02/2021    Latest Reference Range & Units 11/06/21 07:29 11/06/21 11:58 11/06/21 16:25 11/06/21 17:26 11/06/21 21:32 11/07/21 07:23  Glucose-Capillary 70 - 99 mg/dL 184 (H)  Novolog 17 units 225 (H)  Novolog 18 units 65 (L) 216 (H) 309 (H)     Semglee 35 units 202 (H)  Novolog 15 units   Review of Glycemic Control  Diabetes history: DM 2 Outpatient Diabetes medications:  Current orders for Inpatient glycemic control:  Semglee 35 units qhs Novolog 0-9 units tid  Novolog 12 units tid meal coverage  Inpatient Diabetes Program Recommendations:    Pt had hypoglycemia due to too much Novolog at one time with meals.  -   Consider decreasing Novolog meal coverage from 12 units to 10 -   Add Novolog HS scale  Thanks,  Tama Headings RN, MSN, BC-ADM Inpatient Diabetes Coordinator Team Pager [phone] (8a-5p)

## 2021-11-07 NOTE — ED Notes (Signed)
Pt's dinner tray has arrived, Probation officer provided pt's dinner

## 2021-11-07 NOTE — ED Notes (Signed)
Patient resting comfortably at this time.

## 2021-11-07 NOTE — Telephone Encounter (Signed)
Called APS worked at [employer], no answer and voicemail box full.  Will try in on Tuesday. Have tried twice now and unable to get into.

## 2021-11-07 NOTE — ED Notes (Signed)
PT  VOL/  PENDING  PLACEMENT

## 2021-11-07 NOTE — ED Notes (Signed)
Pt c/o of a stomache ache. PRN medication will be given.

## 2021-11-07 NOTE — ED Notes (Signed)
Pt given a lunch tray. 

## 2021-11-07 NOTE — ED Notes (Signed)
Pt resting.

## 2021-11-08 LAB — CBG MONITORING, ED
Glucose-Capillary: 213 mg/dL — ABNORMAL HIGH (ref 70–99)
Glucose-Capillary: 203 mg/dL — ABNORMAL HIGH (ref 70–99)
Glucose-Capillary: 202 mg/dL — ABNORMAL HIGH (ref 70–99)
Glucose-Capillary: 210 mg/dL — ABNORMAL HIGH (ref 70–99)

## 2021-11-08 NOTE — ED Notes (Signed)
Pt given sandwich tray and milk per request.

## 2021-11-08 NOTE — ED Notes (Signed)
Answered pt's call bell. Pt states he wants to make sure he can get new pajamas in the morning in case he goes home. Pt assured will have new pajamas in stock.

## 2021-11-08 NOTE — ED Notes (Signed)
Bg 213

## 2021-11-08 NOTE — ED Notes (Signed)
Bg 203

## 2021-11-08 NOTE — ED Notes (Signed)
Pt eating breakfast 

## 2021-11-08 NOTE — ED Notes (Signed)
Pt walked to bathroom in hallway

## 2021-11-08 NOTE — ED Notes (Signed)
Pt in recliner, pt set up for lunch

## 2021-11-08 NOTE — ED Notes (Signed)
TOC

## 2021-11-08 NOTE — ED Provider Notes (Signed)
-----------------------------------------   5:29 AM on 11/08/2021 -----------------------------------------   Blood pressure (!) 104/58, pulse 76, temperature 98 F (36.7 C), temperature source Oral, resp. rate 18, height '5\' 9"'$  (1.753 m), weight 76 kg, SpO2 98 %.  The patient is calm and cooperative at this time.  There have been no acute events since the last update.  Awaiting disposition plan from Social Work team.   Paulette Blanch, MD 11/08/21 [phone]

## 2021-11-08 NOTE — ED Notes (Signed)
Pt bedding and clothes changed, pt given supplies to wash and brush teeth, pt able to perform ADLs without assistance. Pt in recliner, pt given snack.

## 2021-11-08 NOTE — ED Notes (Signed)
Pt given cereal

## 2021-11-08 NOTE — ED Notes (Signed)
Pt given coffee and pizza.

## 2021-11-08 NOTE — ED Notes (Signed)
Pt given crackers and diet coke.

## 2021-11-09 LAB — CBG MONITORING, ED
Glucose-Capillary: 97 mg/dL (ref 70–99)
Glucose-Capillary: 194 mg/dL — ABNORMAL HIGH (ref 70–99)
Glucose-Capillary: 270 mg/dL — ABNORMAL HIGH (ref 70–99)

## 2021-11-09 NOTE — ED Notes (Signed)
Pt given frosted flakes and milk per request.

## 2021-11-09 NOTE — ED Provider Notes (Signed)
    11/08/2021    9:34 PM 11/08/2021    7:29 AM 11/07/2021    6:27 AM  Vitals with BMI  Systolic 276 147 092  Diastolic 68 62 58  Pulse 66 64 76    No change in the last 24 hours he is awaiting social work placement.   Rada Hay, MD 11/09/21 [phone]

## 2021-11-09 NOTE — ED Notes (Signed)
RN to bedside to introduce self to pt. Pt awake and asking for breakfast. RN advised breakfast would be here around 0900.

## 2021-11-09 NOTE — ED Notes (Signed)
Lunch tray given. 

## 2021-11-09 NOTE — ED Notes (Signed)
TOC Boarder Placement

## 2021-11-09 NOTE — ED Notes (Signed)
Pt walked to bathroom and back to bed. Pt keeps asking if his grand-daughter is coming to pick him up today.

## 2021-11-09 NOTE — ED Notes (Signed)
Pt given a cup of decaf coffee per request

## 2021-11-10 LAB — CBG MONITORING, ED
Glucose-Capillary: 141 mg/dL — ABNORMAL HIGH (ref 70–99)
Glucose-Capillary: 171 mg/dL — ABNORMAL HIGH (ref 70–99)
Glucose-Capillary: 381 mg/dL — ABNORMAL HIGH (ref 70–99)
Glucose-Capillary: 240 mg/dL — ABNORMAL HIGH (ref 70–99)

## 2021-11-10 MED ORDER — MELATONIN 5 MG PO TABS
5.0000 mg | ORAL_TABLET | Freq: Every evening | ORAL | Status: DC | PRN
  Administered 2021-11-10 – 2021-11-11 (×2): 5 mg via ORAL
  Filled 2021-11-10 (×2): qty 1

## 2021-11-10 NOTE — ED Notes (Signed)
SW at bedside.

## 2021-11-10 NOTE — ED Notes (Signed)
Pt ambulating to bathroom with cane

## 2021-11-10 NOTE — ED Notes (Addendum)
Pt given cup of coffee per request. Pt denies any further needs at this time. Pt informed breakfast trays will be delivered shortly.

## 2021-11-10 NOTE — ED Notes (Signed)
VOL  PENDING  PLACEMENT 

## 2021-11-10 NOTE — TOC Progression Note (Signed)
Transition of Care (TOC) - Progression Note    Patient Details  Name: Scott Lawson. MRN: [id number] Date of Birth: 1940/06/18  [facility]) CM/SW Contact  Scott Amas, LCSW Phone Number: 11/10/2021, 9:27 AM  Clinical Narrative:     Placement pending, no bed offers, Scott Lawson with [place] DSS, is working on the guardianship papers and getting the protective order.  She is working on [employer] application and placement.    Expected Discharge Plan: Assisted Living Barriers to Discharge: Unsafe home situation  Expected Discharge Plan and Services Expected Discharge Plan: Assisted Living   Discharge Planning Services: CM Consult   Living arrangements for the past 2 months: Single Family Home                 DME Arranged: N/A DME Agency: NA       HH Arranged: NA HH Agency: NA         Social Determinants of Health (SDOH) Interventions    Readmission Risk Interventions     View : No data to display.

## 2021-11-10 NOTE — ED Provider Notes (Signed)
-----------------------------------------   7:39 AM on 11/10/2021 -----------------------------------------   Blood pressure 116/68, pulse 71, temperature 98 F (36.7 C), temperature source Oral, resp. rate 18, height 1.753 m ('5\' 9"'$ ), weight 76 kg, SpO2 96 %.  The patient is calm and cooperative at this time.  There have been no acute events since the last update.  Awaiting disposition plan from [facility] team.  Patient ambulatory back and forth in the front of the emergency department multiple times throughout the night in no apparent distress.   Hinda Kehr, MD 11/10/21 [phone]

## 2021-11-10 NOTE — ED Notes (Signed)
Pt up to nurses station stating, "I'm going home, that bald doctor told me I could." This RN explaining that we are waiting for SW to ensure safe plan for discharge and there has not been an update this morning. Pt then states, "I don't care, I'm going home, get the papers ready." Pt then proceeded back to his bed. Pt resting in bed comfortably.

## 2021-11-10 NOTE — ED Notes (Signed)
Messaged SW [place] to request they update pt. Awaiting response.

## 2021-11-10 NOTE — ED Notes (Signed)
Pt given a sandwich tray and a cup of decaf coffee

## 2021-11-10 NOTE — ED Notes (Signed)
Pt given meal tray.

## 2021-11-10 NOTE — ED Notes (Signed)
Pt calm and cooperative, seems more agreeable to plan- states "I am leaving here tomorrow." Pt ambulatory to bathroom with cane at this time.

## 2021-11-10 NOTE — ED Notes (Signed)
Pt given frosted flakes cereal and milk

## 2021-11-10 NOTE — ED Notes (Signed)
Dinner tray provided to patient who is calm and cooperative
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
## 2021-11-10 NOTE — ED Notes (Signed)
Pt asleep in hallway bed 1, NAD noted
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
## 2021-11-10 NOTE — ED Notes (Addendum)
Pt at nurses station again stating he is going home. This RN spoke with MD covering in concerns of pt trying to leave. Pt is not IVCd. This RN given ok for pt to contact granddaughter for potentially a ride home as there has been no update from TOC/SW.   Pt given phone to call granddaughter. Pt left voicemail at this time.
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
## 2021-11-10 NOTE — ED Notes (Signed)
Pt given a warm blanket 

## 2021-11-11 LAB — CBG MONITORING, ED
Glucose-Capillary: 269 mg/dL — ABNORMAL HIGH (ref 70–99)
Glucose-Capillary: 105 mg/dL — ABNORMAL HIGH (ref 70–99)
Glucose-Capillary: 140 mg/dL — ABNORMAL HIGH (ref 70–99)

## 2021-11-11 MED ORDER — ASPIRIN 81 MG PO TBEC: 81.0000 mg | DELAYED_RELEASE_TABLET | Freq: Every day | ORAL | 2 refills | Status: DC

## 2021-11-11 MED ORDER — INSULIN LISPRO 100 UNIT/ML IJ SOLN: INTRAMUSCULAR | 3 refills | Status: DC

## 2021-11-11 MED ORDER — OMEPRAZOLE MAGNESIUM 20 MG PO TBEC: 20.0000 mg | DELAYED_RELEASE_TABLET | Freq: Every day | ORAL | 1 refills | Status: DC

## 2021-11-11 MED ORDER — FLUTICASONE PROPIONATE 50 MCG/ACT NA SUSP: NASAL | 2 refills | Status: DC

## 2021-11-11 MED ORDER — INSULIN GLARGINE 100 UNIT/ML ~~LOC~~ SOLN: SUBCUTANEOUS | 3 refills | Status: DC

## 2021-11-11 MED ORDER — ALBUTEROL SULFATE HFA 108 (90 BASE) MCG/ACT IN AERS: 2.0000 | INHALATION_SPRAY | Freq: Four times a day (QID) | RESPIRATORY_TRACT | 2 refills | Status: DC | PRN

## 2021-11-11 MED ORDER — METFORMIN HCL 500 MG PO TABS: 1000.0000 mg | ORAL_TABLET | Freq: Two times a day (BID) | ORAL | 11 refills | Status: DC

## 2021-11-11 MED ORDER — ATORVASTATIN CALCIUM 80 MG PO TABS: 80.0000 mg | ORAL_TABLET | Freq: Every day | ORAL | 11 refills | Status: DC

## 2021-11-11 MED ORDER — TAMSULOSIN HCL 0.4 MG PO CAPS: 0.4000 mg | ORAL_CAPSULE | Freq: Every day | ORAL | 3 refills | Status: DC
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
## 2021-11-11 NOTE — ED Provider Notes (Signed)
Patient's son comes from [place] to visit.  He wants to take the patient home.  We cleared this with social work.  Patient will follow-up with at the [place] in [place].  Prescriptions have been written.  Patient is good to go and is expecting to go.   Nena Polio, MD 11/11/21 [phone]
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
## 2021-11-11 NOTE — ED Notes (Signed)
Pt given breakfast tray

## 2021-11-11 NOTE — ED Notes (Signed)
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 Pt's son did briefly visit him; this RN unaware of any plan son might have of being pt's ride; son did not come into contact with this RN. SW told this RN that pt has no home to go back to currently.

## 2021-11-11 NOTE — ED Notes (Signed)
Pt given black coffee as requested. Pt verbalized to this RN he understands to be careful since it is hot.

## 2021-11-11 NOTE — ED Notes (Signed)
SH with SW notified via secure chat that pt wishes to speak with her; SW states talked thoroughly with pt yesterday and will talk with him again today if she has time.

## 2021-11-11 NOTE — ED Notes (Signed)
Pt ate everything from his lunch tray except for a cookie.

## 2021-11-11 NOTE — ED Notes (Signed)
Pt easily woken by voice. Pt requesting food stating his BG "should be normal by now". Explained to pt will recheck BG soon and check with provider if pt okay to eat.

## 2021-11-11 NOTE — ED Notes (Addendum)
Pt sitting calmly in reclined. Pt requesting snack; pt doesn't remember getting lunch tray; pt reminded that he was eating from his tray not long after this RN last checked his BG. Pt's cane placed within reach.

## 2021-11-11 NOTE — TOC Progression Note (Signed)
Transition of Care (TOC) - Progression Note    Patient Details  Name: Scott Lawson. MRN: [id number] Date of Birth: [birth year]  Transition of Care [facility]) CM/SW Contact  Anselm Pancoast, RN Phone Number: 11/11/2021, 1:32 PM  Clinical Narrative:    LVMM for granddaughter Rosey Bath [phone] updating that patient is requesting discharge home. Patient reports his granddaughter helps him and is able to assist him after discharge. Previous conversation with family has reported they are not willing to assist patient and he has no place to discharge.    Expected Discharge Plan: Assisted Living Barriers to Discharge: Unsafe home situation  Expected Discharge Plan and Services Expected Discharge Plan: Assisted Living   Discharge Planning Services: CM Consult   Living arrangements for the past 2 months: Single Family Home                 DME Arranged: N/A DME Agency: NA       HH Arranged: NA HH Agency: NA         Social Determinants of Health (SDOH) Interventions    Readmission Risk Interventions     View : No data to display.

## 2021-11-11 NOTE — ED Notes (Signed)
Pt's son back to bedside; EDP Malinda to bedside.

## 2021-11-12 ENCOUNTER — Telehealth: Payer: Self-pay | Admitting: Nurse Practitioner

## 2021-11-12 ENCOUNTER — Telehealth: Payer: Self-pay | Admitting: *Deleted

## 2021-11-12 NOTE — Telephone Encounter (Signed)
Transition Care Management Unsuccessful Follow-up Telephone Call   Date of discharge and from where:  [facility] 11-11-2021  Attempts:  1st Attempt  Reason for unsuccessful TCM follow-up call:  Left voice message

## 2021-11-12 NOTE — Telephone Encounter (Signed)
Spoke to [employer], APS SW, on phone this evening.  Updated her on concerns with patient case -- how he had Covid in February 2022 and then around August 2022 last saw PCP and then started missing visits and chronic care team + palliative could not get in touch with family or patient.  She has sent form to office for medical records, will follow-up with staff on this.  She is visiting with son tomorrow. Informed her son presented to office today to schedule appointment for patient, this is after he had told hospital that he was taking patient home with him to [facility] -- he told front desk staff he is moving down here.

## 2021-11-12 NOTE — Telephone Encounter (Signed)
Left general message with [name] on telephone and provided contact number for her to reach PCP.  Will update her when calls.

## 2021-11-13 NOTE — Telephone Encounter (Signed)
Spoke with Scott Lawson and informed her that we have not received the fax at this time, She states she will email it to me.

## 2021-11-14 NOTE — Telephone Encounter (Signed)
Record request mailed to the appropriate facility to complete it.

## 2021-11-26 DIAGNOSIS — J479 Bronchiectasis, uncomplicated: Secondary | ICD-10-CM | POA: Diagnosis not present

## 2021-11-27 DIAGNOSIS — U071 COVID-19: Secondary | ICD-10-CM | POA: Diagnosis not present

## 2021-11-27 DIAGNOSIS — N183 Chronic kidney disease, stage 3 unspecified: Secondary | ICD-10-CM | POA: Diagnosis not present

## 2021-12-01 ENCOUNTER — Ambulatory Visit: Payer: Medicare HMO

## 2021-12-04 ENCOUNTER — Encounter: Payer: Self-pay | Admitting: Family Medicine

## 2021-12-04 ENCOUNTER — Ambulatory Visit (INDEPENDENT_AMBULATORY_CARE_PROVIDER_SITE_OTHER): Payer: Medicare Other | Admitting: Family Medicine

## 2021-12-04 VITALS — BP 114/62 | HR 69 | Temp 98.1°F | Wt 196.4 lb

## 2021-12-04 DIAGNOSIS — R4689 Other symptoms and signs involving appearance and behavior: Secondary | ICD-10-CM

## 2021-12-04 DIAGNOSIS — J9611 Chronic respiratory failure with hypoxia: Secondary | ICD-10-CM | POA: Diagnosis not present

## 2021-12-04 DIAGNOSIS — J849 Interstitial pulmonary disease, unspecified: Secondary | ICD-10-CM

## 2021-12-04 DIAGNOSIS — Z794 Long term (current) use of insulin: Secondary | ICD-10-CM | POA: Diagnosis not present

## 2021-12-04 DIAGNOSIS — N401 Enlarged prostate with lower urinary tract symptoms: Secondary | ICD-10-CM

## 2021-12-04 DIAGNOSIS — I5032 Chronic diastolic (congestive) heart failure: Secondary | ICD-10-CM

## 2021-12-04 DIAGNOSIS — N183 Chronic kidney disease, stage 3 unspecified: Secondary | ICD-10-CM

## 2021-12-04 DIAGNOSIS — E1169 Type 2 diabetes mellitus with other specified complication: Secondary | ICD-10-CM | POA: Diagnosis not present

## 2021-12-04 DIAGNOSIS — N1831 Chronic kidney disease, stage 3a: Secondary | ICD-10-CM

## 2021-12-04 DIAGNOSIS — I1310 Hypertensive heart and chronic kidney disease without heart failure, with stage 1 through stage 4 chronic kidney disease, or unspecified chronic kidney disease: Secondary | ICD-10-CM | POA: Diagnosis not present

## 2021-12-04 DIAGNOSIS — F03918 Unspecified dementia, unspecified severity, with other behavioral disturbance: Secondary | ICD-10-CM

## 2021-12-04 DIAGNOSIS — E1122 Type 2 diabetes mellitus with diabetic chronic kidney disease: Secondary | ICD-10-CM

## 2021-12-04 DIAGNOSIS — J432 Centrilobular emphysema: Secondary | ICD-10-CM

## 2021-12-04 DIAGNOSIS — E538 Deficiency of other specified B group vitamins: Secondary | ICD-10-CM | POA: Diagnosis not present

## 2021-12-04 DIAGNOSIS — C61 Malignant neoplasm of prostate: Secondary | ICD-10-CM

## 2021-12-04 DIAGNOSIS — K219 Gastro-esophageal reflux disease without esophagitis: Secondary | ICD-10-CM

## 2021-12-04 DIAGNOSIS — E782 Mixed hyperlipidemia: Secondary | ICD-10-CM | POA: Diagnosis not present

## 2021-12-04 DIAGNOSIS — I700 Atherosclerosis of aorta: Secondary | ICD-10-CM | POA: Diagnosis not present

## 2021-12-04 LAB — URINALYSIS, ROUTINE W REFLEX MICROSCOPIC
Bilirubin, UA: NEGATIVE
Ketones, UA: NEGATIVE
Leukocytes,UA: NEGATIVE
Nitrite, UA: NEGATIVE
Protein,UA: NEGATIVE
RBC, UA: NEGATIVE
Specific Gravity, UA: 1.015 (ref 1.005–1.030)
Urobilinogen, Ur: 0.2 mg/dL (ref 0.2–1.0)
pH, UA: 6 (ref 5.0–7.5)

## 2021-12-04 LAB — MICROALBUMIN, URINE WAIVED
Creatinine, Urine Waived: 50 mg/dL (ref 10–300)
Microalb, Ur Waived: 10 mg/L (ref 0–19)

## 2021-12-04 LAB — BAYER DCA HB A1C WAIVED: HB A1C (BAYER DCA - WAIVED): 10.1 % — ABNORMAL HIGH (ref 4.8–5.6)

## 2021-12-04 MED ORDER — ASPIRIN 81 MG PO TBEC: 81.0000 mg | DELAYED_RELEASE_TABLET | Freq: Every day | ORAL | 2 refills | Status: AC

## 2021-12-04 MED ORDER — DONEPEZIL HCL 10 MG PO TABS: 10.0000 mg | ORAL_TABLET | Freq: Every day | ORAL | 2 refills | Status: DC

## 2021-12-04 MED ORDER — QUETIAPINE FUMARATE 25 MG PO TABS: 25.0000 mg | ORAL_TABLET | Freq: Every day | ORAL | 2 refills | Status: AC

## 2021-12-04 MED ORDER — ATORVASTATIN CALCIUM 80 MG PO TABS: 80.0000 mg | ORAL_TABLET | Freq: Every day | ORAL | 1 refills | Status: AC

## 2021-12-04 NOTE — Assessment & Plan Note (Signed)
Will restart atorvastatin. Recheck next visit. Labs drawn today. Continue to monitor.

## 2021-12-04 NOTE — Assessment & Plan Note (Signed)
Euvolemic today. Has been off all medicines except insulin. Continue to monitor. ?farxiga pending labs tomorrow.

## 2021-12-04 NOTE — Assessment & Plan Note (Signed)
Will keep BP, sugars and cholesterol under good control. Continue to monitor.

## 2021-12-04 NOTE — Assessment & Plan Note (Signed)
Oxygen level good today. Has been getting better. No issues at this time.

## 2021-12-04 NOTE — Assessment & Plan Note (Addendum)
Has been off his flomax for unknown amount of time. Has not been having nocturia. BP running low. Will hold on restarting for now.

## 2021-12-04 NOTE — Assessment & Plan Note (Signed)
BP under good control off medicine. May want ACE in future for renal protection, but will hold on starting one for now.

## 2021-12-04 NOTE — Assessment & Plan Note (Signed)
Rechecking labs today. Await results. Treat as needed.  °

## 2021-12-04 NOTE — Assessment & Plan Note (Signed)
Not doing well. Now has help with son and daughter in law. Will get him started on aricept and seroquel for behavioral issues. Recheck in about a month with PCP.

## 2021-12-04 NOTE — Assessment & Plan Note (Signed)
Has not been checking sugars. Will get him to check. Glucometer ordered. Follow up in 1 month with PCP.
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
## 2021-12-04 NOTE — Assessment & Plan Note (Signed)
No concerns about SOB today. Continue to monitor. Continue albuterol as needed.

## 2021-12-04 NOTE — Assessment & Plan Note (Signed)
Rechecking labs today. Await results. Treat as needed.  °

## 2021-12-05 ENCOUNTER — Other Ambulatory Visit: Payer: Self-pay | Admitting: Family Medicine

## 2021-12-05 ENCOUNTER — Other Ambulatory Visit: Payer: Self-pay

## 2021-12-05 ENCOUNTER — Telehealth: Payer: Self-pay

## 2021-12-05 DIAGNOSIS — E538 Deficiency of other specified B group vitamins: Secondary | ICD-10-CM

## 2021-12-05 DIAGNOSIS — J9601 Acute respiratory failure with hypoxia: Secondary | ICD-10-CM | POA: Diagnosis not present

## 2021-12-05 DIAGNOSIS — U071 COVID-19: Secondary | ICD-10-CM | POA: Diagnosis not present

## 2021-12-05 LAB — COMPREHENSIVE METABOLIC PANEL WITH GFR
ALT: 12 IU/L (ref 0–44)
AST: 15 IU/L (ref 0–40)
Albumin/Globulin Ratio: 2 (ref 1.2–2.2)
Albumin: 4.5 g/dL (ref 3.7–4.7)
Alkaline Phosphatase: 66 IU/L (ref 44–121)
BUN/Creatinine Ratio: 22 (ref 10–24)
BUN: 21 mg/dL (ref 8–27)
Bilirubin Total: 0.5 mg/dL (ref 0.0–1.2)
CO2: 25 mmol/L (ref 20–29)
Calcium: 10 mg/dL (ref 8.6–10.2)
Chloride: 101 mmol/L (ref 96–106)
Creatinine, Ser: 0.97 mg/dL (ref 0.76–1.27)
Globulin, Total: 2.3 g/dL (ref 1.5–4.5)
Glucose: 77 mg/dL (ref 70–99)
Potassium: 4.5 mmol/L (ref 3.5–5.2)
Sodium: 143 mmol/L (ref 134–144)
Total Protein: 6.8 g/dL (ref 6.0–8.5)
eGFR: 79 mL/min/1.73

## 2021-12-05 LAB — CBC WITH DIFFERENTIAL/PLATELET
Basophils Absolute: 0.1 x10E3/uL (ref 0.0–0.2)
Basos: 1 %
EOS (ABSOLUTE): 0.2 x10E3/uL (ref 0.0–0.4)
Eos: 3 %
Hematocrit: 44.2 % (ref 37.5–51.0)
Hemoglobin: 14.4 g/dL (ref 13.0–17.7)
Immature Grans (Abs): 0 x10E3/uL (ref 0.0–0.1)
Immature Granulocytes: 1 %
Lymphocytes Absolute: 2.4 x10E3/uL (ref 0.7–3.1)
Lymphs: 30 %
MCH: 30.1 pg (ref 26.6–33.0)
MCHC: 32.6 g/dL (ref 31.5–35.7)
MCV: 93 fL (ref 79–97)
Monocytes Absolute: 0.7 x10E3/uL (ref 0.1–0.9)
Monocytes: 9 %
Neutrophils Absolute: 4.5 x10E3/uL (ref 1.4–7.0)
Neutrophils: 56 %
Platelets: 317 x10E3/uL (ref 150–450)
RBC: 4.78 x10E6/uL (ref 4.14–5.80)
RDW: 12.3 % (ref 11.6–15.4)
WBC: 7.8 x10E3/uL (ref 3.4–10.8)

## 2021-12-05 LAB — LIPID PANEL W/O CHOL/HDL RATIO
Cholesterol, Total: 243 mg/dL — ABNORMAL HIGH (ref 100–199)
HDL: 55 mg/dL
LDL Chol Calc (NIH): 130 mg/dL — ABNORMAL HIGH (ref 0–99)
Triglycerides: 325 mg/dL — ABNORMAL HIGH (ref 0–149)
VLDL Cholesterol Cal: 58 mg/dL — ABNORMAL HIGH (ref 5–40)

## 2021-12-05 LAB — PSA: Prostate Specific Ag, Serum: 1.5 ng/mL (ref 0.0–4.0)

## 2021-12-05 LAB — VITAMIN B12: Vitamin B-12: 296 pg/mL (ref 232–1245)

## 2021-12-05 LAB — TSH: TSH: 1.43 u[IU]/mL (ref 0.450–4.500)

## 2021-12-05 MED ORDER — CYANOCOBALAMIN 1000 MCG/ML IJ SOLN: 1000.0000 ug | INTRAMUSCULAR | Status: AC

## 2021-12-05 MED ORDER — ONETOUCH VERIO W/DEVICE KIT: PACK | 0 refills | Status: AC

## 2021-12-05 MED ORDER — ONETOUCH ULTRASOFT LANCETS MISC: 12 refills | Status: AC

## 2021-12-05 MED ORDER — ONETOUCH VERIO VI STRP: ORAL_STRIP | 12 refills | Status: AC

## 2021-12-10 ENCOUNTER — Ambulatory Visit: Payer: Medicare Other | Admitting: Nurse Practitioner

## 2021-12-18 ENCOUNTER — Emergency Department
Admission: EM | Admit: 2021-12-18 | Discharge: 2021-12-20 | Disposition: A | Discharge status: Home / Self Care | Payer: Medicare Other | Attending: Emergency Medicine | Admitting: Emergency Medicine

## 2021-12-18 DIAGNOSIS — N189 Chronic kidney disease, unspecified: Secondary | ICD-10-CM | POA: Insufficient documentation

## 2021-12-18 DIAGNOSIS — D519 Vitamin B12 deficiency anemia, unspecified: Secondary | ICD-10-CM | POA: Insufficient documentation

## 2021-12-18 DIAGNOSIS — Z20822 Contact with and (suspected) exposure to covid-19: Secondary | ICD-10-CM | POA: Insufficient documentation

## 2021-12-18 DIAGNOSIS — R4689 Other symptoms and signs involving appearance and behavior: Secondary | ICD-10-CM | POA: Diagnosis present

## 2021-12-18 DIAGNOSIS — Z7982 Long term (current) use of aspirin: Secondary | ICD-10-CM | POA: Diagnosis not present

## 2021-12-18 DIAGNOSIS — E538 Deficiency of other specified B group vitamins: Secondary | ICD-10-CM | POA: Diagnosis present

## 2021-12-18 DIAGNOSIS — Z046 Encounter for general psychiatric examination, requested by authority: Secondary | ICD-10-CM | POA: Insufficient documentation

## 2021-12-18 DIAGNOSIS — E1122 Type 2 diabetes mellitus with diabetic chronic kidney disease: Secondary | ICD-10-CM | POA: Diagnosis present

## 2021-12-18 DIAGNOSIS — I700 Atherosclerosis of aorta: Secondary | ICD-10-CM | POA: Diagnosis not present

## 2021-12-18 DIAGNOSIS — F0390 Unspecified dementia without behavioral disturbance: Secondary | ICD-10-CM | POA: Diagnosis not present

## 2021-12-18 DIAGNOSIS — J439 Emphysema, unspecified: Secondary | ICD-10-CM | POA: Diagnosis not present

## 2021-12-18 DIAGNOSIS — R451 Restlessness and agitation: Secondary | ICD-10-CM | POA: Diagnosis not present

## 2021-12-18 DIAGNOSIS — R456 Violent behavior: Secondary | ICD-10-CM | POA: Diagnosis not present

## 2021-12-18 DIAGNOSIS — Z794 Long term (current) use of insulin: Secondary | ICD-10-CM | POA: Diagnosis not present

## 2021-12-18 DIAGNOSIS — F03918 Unspecified dementia, unspecified severity, with other behavioral disturbance: Secondary | ICD-10-CM | POA: Diagnosis not present

## 2021-12-18 LAB — CBC WITH DIFFERENTIAL/PLATELET
Abs Immature Granulocytes: 0.03 10*3/uL (ref 0.00–0.07)
Basophils Absolute: 0.1 10*3/uL (ref 0.0–0.1)
Basophils Relative: 1 %
Eosinophils Absolute: 0.1 10*3/uL (ref 0.0–0.5)
Eosinophils Relative: 1 %
HCT: 48.1 % (ref 39.0–52.0)
Hemoglobin: 15.2 g/dL (ref 13.0–17.0)
Immature Granulocytes: 0 %
Lymphocytes Relative: 30 %
Lymphs Abs: 2.5 10*3/uL (ref 0.7–4.0)
MCH: 29.9 pg (ref 26.0–34.0)
MCHC: 31.6 g/dL (ref 30.0–36.0)
MCV: 94.5 fL (ref 80.0–100.0)
Monocytes Absolute: 0.6 10*3/uL (ref 0.1–1.0)
Monocytes Relative: 8 %
Neutro Abs: 5 10*3/uL (ref 1.7–7.7)
Neutrophils Relative %: 60 %
Platelets: 281 10*3/uL (ref 150–400)
RBC: 5.09 MIL/uL (ref 4.22–5.81)
RDW: 12.6 % (ref 11.5–15.5)
WBC: 8.3 10*3/uL (ref 4.0–10.5)
nRBC: 0 % (ref 0.0–0.2)

## 2021-12-18 LAB — BASIC METABOLIC PANEL
Anion gap: 9 (ref 5–15)
BUN: 28 mg/dL — ABNORMAL HIGH (ref 8–23)
CO2: 24 mmol/L (ref 22–32)
Calcium: 9.1 mg/dL (ref 8.9–10.3)
Chloride: 104 mmol/L (ref 98–111)
Creatinine, Ser: 1.27 mg/dL — ABNORMAL HIGH (ref 0.61–1.24)
GFR, Estimated: 57 mL/min — ABNORMAL LOW (ref >60)
Glucose, Bld: 288 mg/dL — ABNORMAL HIGH (ref 70–99)
Potassium: 4.5 mmol/L (ref 3.5–5.1)
Sodium: 137 mmol/L (ref 135–145)

## 2021-12-18 LAB — ETHANOL: Alcohol, Ethyl (B): <10 mg/dL (ref <10)

## 2021-12-18 NOTE — ED Notes (Signed)
INVOLUNTARY with all papers on chart/awaiting TTS/PSYCH consult ?
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
## 2021-12-18 NOTE — ED Notes (Signed)
Spoke to pt regarding situation surrounding admission.  Pt stated that

## 2021-12-18 NOTE — ED Notes (Signed)
Pt given sandwich tray and drink.
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
## 2021-12-19 LAB — CBG MONITORING, ED
Glucose-Capillary: 277 mg/dL — ABNORMAL HIGH (ref 70–99)
Glucose-Capillary: 321 mg/dL — ABNORMAL HIGH (ref 70–99)

## 2021-12-19 LAB — RESP PANEL BY RT-PCR (FLU A&B, COVID) ARPGX2
Influenza A by PCR: NEGATIVE
Influenza B by PCR: NEGATIVE
SARS Coronavirus 2 by RT PCR: NEGATIVE

## 2021-12-19 MED ORDER — INSULIN ASPART 100 UNIT/ML IJ SOLN
15.0000 [IU] | Freq: Three times a day (TID) | INTRAMUSCULAR | Status: DC
  Administered 2021-12-19 – 2021-12-20 (×2): 15 [IU] via SUBCUTANEOUS
  Filled 2021-12-19 (×2): qty 1

## 2021-12-19 MED ORDER — DONEPEZIL HCL 5 MG PO TABS
10.0000 mg | ORAL_TABLET | Freq: Every day | ORAL | Status: DC
  Administered 2021-12-19: 10 mg via ORAL
  Filled 2021-12-19: qty 2

## 2021-12-19 MED ORDER — DIVALPROEX SODIUM 125 MG PO DR TAB
125.0000 mg | DELAYED_RELEASE_TABLET | Freq: Two times a day (BID) | ORAL | Status: DC
Start: 2021-12-19 — End: 2021-12-20
  Administered 2021-12-19 – 2021-12-20 (×3): 125 mg via ORAL
  Filled 2021-12-19 (×3): qty 1

## 2021-12-19 MED ORDER — ATORVASTATIN CALCIUM 20 MG PO TABS
80.0000 mg | ORAL_TABLET | Freq: Every day | ORAL | Status: DC
  Administered 2021-12-19 – 2021-12-20 (×2): 80 mg via ORAL
  Filled 2021-12-19: qty 4

## 2021-12-19 MED ORDER — QUETIAPINE FUMARATE 25 MG PO TABS
12.5000 mg | ORAL_TABLET | Freq: Two times a day (BID) | ORAL | Status: DC | PRN
  Administered 2021-12-20: 12.5 mg via ORAL
  Filled 2021-12-19: qty 1

## 2021-12-19 MED ORDER — INSULIN GLARGINE-YFGN 100 UNIT/ML ~~LOC~~ SOLN
35.0000 [IU] | Freq: Every day | SUBCUTANEOUS | Status: DC
  Administered 2021-12-19: 35 [IU] via SUBCUTANEOUS
  Filled 2021-12-19 (×2): qty 0.35

## 2021-12-19 MED ORDER — ASPIRIN 81 MG PO TBEC
81.0000 mg | DELAYED_RELEASE_TABLET | Freq: Every day | ORAL | Status: DC
  Administered 2021-12-19 – 2021-12-20 (×2): 81 mg via ORAL
  Filled 2021-12-19 (×2): qty 1

## 2021-12-19 NOTE — ED Notes (Signed)
Sandwich tray and drink given. Pt consumed 80%

## 2021-12-19 NOTE — ED Notes (Signed)
Pt calling out to this nurse. Asking when he can go home.

## 2021-12-19 NOTE — ED Notes (Signed)
Breakfast tray and beverage provided

## 2021-12-19 NOTE — ED Notes (Signed)
IVC/  PENDING  PLACEMENT 

## 2021-12-19 NOTE — ED Notes (Signed)
Pt given orange juice and crackers at this time.

## 2021-12-19 NOTE — ED Notes (Signed)
Pt. Transferred to [facility] from ED to room 4 after screening for contraband. Report to include Situation, Background, Assessment and Recommendations from [facility]. Pt. Oriented to unit including Q15 minute rounds as well as the security cameras for their protection. Patient is alert and oriented, warm and dry in no acute distress. Patient denies SI, HI, and AVH. He has advanced dementia. Pt. Encouraged to let me know if needs arise.

## 2021-12-20 LAB — CBG MONITORING, ED
Glucose-Capillary: 271 mg/dL — ABNORMAL HIGH (ref 70–99)
Glucose-Capillary: 117 mg/dL — ABNORMAL HIGH (ref 70–99)

## 2021-12-20 NOTE — ED Notes (Signed)
Pt a/o x3 denies any SI/HI stated he has never had any a/v hallucinations. Pt son/caregiver arrived to take pt home via POV.  Medications reviewed with son.  Son verbalized understanding

## 2021-12-20 NOTE — ED Provider Notes (Signed)
-----------------------------------------   1:32 PM on 12/20/2021 -----------------------------------------  The patient has been reevaluated by NP Lord from psychiatry.  IVC has been rescinded.  She has cleared the patient for discharge.  The patient son is here to pick him up.  He is stable for discharge at this time.  Return precautions provided.   Arta Silence, MD 12/20/21 [phone]

## 2021-12-20 NOTE — TOC Progression Note (Addendum)
Transition of Care (TOC) - Progression Note    Patient Details  Name: Scott Lawson. MRN: [id number] Date of Birth: [birth year]  Transition of Care [facility]) CM/SW [place], [place] Phone Number: 12/20/2021, 11:11 AM  Clinical Narrative:     Update #2: Daughter [name] reports Juanda Crumble will pick patient up in an hour, he wishes to speak with MD to ensure patient is going home on appropriate medications. CSW has informed treatment team.    Update: Patient's daughter [name] returned call, reports she is not able to care for patient and states that Juanda Crumble is reporting he's also not going to care for patient. [name] reports she will attempt to get patient's sister Patsy's contact information to see if she can assist. [name] will call this CSW back if she obtains that information.      CSW attempted to call patient's daughter [name], no answer lvm attempting to contact her to retreive information to connect with patient's sister Patsy.   APS worker Marnee Guarneri [phone] did call this CSW back. Reports that they do not have guardianship over patient. Therefore patient can make his own decisions. At this time they report patient's son Juanda Crumble is petitioning for guardianship but this is in process.   [place] DSS does not have a protective order, patient can be discharged home once IVC is lifted and DSS can follow up outpatient.  All concerns have been addressed with APS for them to follow up.    Expected Discharge Plan: Home/Self Care Barriers to Discharge: ED Unsafe disposition  Expected Discharge Plan and Services Expected Discharge Plan: Home/Self Care                                               Social Determinants of Health (SDOH) Interventions    Readmission Risk Interventions     No data to display

## 2021-12-20 NOTE — Discharge Instructions (Signed)
Return to the ER for any new or worsening behavioral or medical concerns.  Follow-up with your primary care doctor.

## 2021-12-20 NOTE — ED Notes (Signed)
IVC pending placement 

## 2021-12-20 NOTE — TOC Initial Note (Signed)
Transition of Care (TOC) - Initial/Assessment Note    Patient Details  Name: Scott Lawson. MRN: [id number] Date of Birth: Aug 17, 1940  Transition of Care [facility]) CM/SW Contact:    Scott Sam, LCSW Phone Number: 12/20/2021, 10:53 AM  Clinical Narrative:                  CSW called for Scott Lawson- [phone] with update on patient's intention to leave ED, no answer lvm.   CSW called supervisor Scott Lawson [phone], left voicemail.  CSW also called on call social worker for after hours with DSS at [phone] pending call back.   CSW attempted to reach patient's son Scott Lawson to pick up as patient wishes to leave, however call went straight to vm and vm box is full.   CSW called and left voicemails for patient's great-granddaughters Scott Lawson [phone], Scott Lawson [phone]. CSW has called patient's daughter Scott Lawson [phone].   Scott Lawson returned call: confirms patient's allegations of Scott Lawson attempting to take his land etc, reports patient has a sister Scott Lawson that may can help however Scott Lawson is 68 and unable to do anything currently to assist with getting patient from hospital.   On call social worker called this CSW back, CSW informed of patient's IVC, brought in by family under suspected false claims. Patient wishes to go home. RN and DSS aware we cannot hold patient here against his will, however TOC is currently working on a plan for patient's safe discharge due to patient diagnosis of underlying dementia. Pending call back from APS to aid in facilitating safe discharge.   Expected Discharge Plan: Home/Self Care Barriers to Discharge: ED Unsafe disposition   Patient Goals and CMS Choice Patient states their goals for this hospitalization and ongoing recovery are:: to go home CMS Medicare.gov Compare Post Acute Care list provided to:: Patient Choice offered to / list presented to : Patient  Expected Discharge Plan and  Services Expected Discharge Plan: Home/Self Care                                              Prior Living Arrangements/Services                       Activities of Daily Living      Permission Sought/Granted                  Emotional Assessment              Admission diagnosis:  IVC Patient Active Problem List   Diagnosis Date Noted   Dementia with behavioral disturbance ([place]) 12/04/2021   Aggressive behavior 10/23/2021   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[place]) 02/04/2021   Chronic diastolic CHF (congestive heart failure) ([place]) 02/04/2021   Emphysema lung ([place]) 10/26/2020   History of 2019 novel coronavirus disease (COVID-19) 10/26/2020   Interstitial lung disease ([place]) 08/24/2020   Chronic respiratory failure with hypoxia ([place]) 06/20/2020   Atherosclerosis of aorta ([place]) 10/12/2019   Long-term insulin use ([place]) 10/12/2019   B12 deficiency 06/28/2018   Prostate cancer ([place]) 06/27/2018   Psoriasis 08/16/2017   Advanced care planning/counseling discussion 12/01/2016   GERD without esophagitis 08/14/2015   Type 2 diabetes mellitus with diabetic chronic kidney disease ([place]) 08/13/2015   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

## 2021-12-20 NOTE — ED Notes (Signed)
Pt continuing to requested to discharge.  Staff attempted to explain to pt that we cannot as he has no place to go.  Pt became agitated.

## 2021-12-20 NOTE — ED Notes (Signed)
Report received from Malachi Pro, Conservation officer, nature. On initial round after report Pt is warm/dry, resting quietly in room without any s/s of distress.  Will continue to monitor throughout shift as ordered for any changes in behaviors and for continued safety.

## 2021-12-20 NOTE — ED Notes (Signed)
Pt awake in dayroom, requesting to "go home". Continues to ask why is in "jail"  staff has attempted to redirect and have a reality based conversation to no avail.

## 2021-12-26 DIAGNOSIS — J479 Bronchiectasis, uncomplicated: Secondary | ICD-10-CM | POA: Diagnosis not present

## 2021-12-27 DIAGNOSIS — N183 Chronic kidney disease, stage 3 unspecified: Secondary | ICD-10-CM | POA: Diagnosis not present

## 2021-12-27 DIAGNOSIS — U071 COVID-19: Secondary | ICD-10-CM | POA: Diagnosis not present

## 2022-01-01 ENCOUNTER — Ambulatory Visit: Payer: Medicare Other | Admitting: Nurse Practitioner

## 2022-01-04 DIAGNOSIS — U071 COVID-19: Secondary | ICD-10-CM | POA: Diagnosis not present

## 2022-01-04 DIAGNOSIS — J9601 Acute respiratory failure with hypoxia: Secondary | ICD-10-CM | POA: Diagnosis not present

## 2022-01-26 DIAGNOSIS — J479 Bronchiectasis, uncomplicated: Secondary | ICD-10-CM | POA: Diagnosis not present

## 2022-01-27 DIAGNOSIS — U071 COVID-19: Secondary | ICD-10-CM | POA: Diagnosis not present

## 2022-01-27 DIAGNOSIS — N183 Chronic kidney disease, stage 3 unspecified: Secondary | ICD-10-CM | POA: Diagnosis not present

## 2022-02-04 DIAGNOSIS — J9601 Acute respiratory failure with hypoxia: Secondary | ICD-10-CM | POA: Diagnosis not present

## 2022-02-04 DIAGNOSIS — U071 COVID-19: Secondary | ICD-10-CM | POA: Diagnosis not present

## 2022-02-25 ENCOUNTER — Emergency Department
Admission: EM | Admit: 2022-02-25 | Discharge: 2022-03-10 | Disposition: A | Discharge status: Home / Self Care | Payer: Medicare Other | Attending: Emergency Medicine | Admitting: Emergency Medicine

## 2022-02-25 ENCOUNTER — Other Ambulatory Visit: Payer: Self-pay

## 2022-02-25 DIAGNOSIS — Z20822 Contact with and (suspected) exposure to covid-19: Secondary | ICD-10-CM | POA: Diagnosis not present

## 2022-02-25 DIAGNOSIS — I130 Hypertensive heart and chronic kidney disease with heart failure and stage 1 through stage 4 chronic kidney disease, or unspecified chronic kidney disease: Secondary | ICD-10-CM | POA: Insufficient documentation

## 2022-02-25 DIAGNOSIS — E1122 Type 2 diabetes mellitus with diabetic chronic kidney disease: Secondary | ICD-10-CM | POA: Insufficient documentation

## 2022-02-25 DIAGNOSIS — Z79899 Other long term (current) drug therapy: Secondary | ICD-10-CM | POA: Insufficient documentation

## 2022-02-25 DIAGNOSIS — N189 Chronic kidney disease, unspecified: Secondary | ICD-10-CM | POA: Diagnosis not present

## 2022-02-25 DIAGNOSIS — F03918 Unspecified dementia, unspecified severity, with other behavioral disturbance: Secondary | ICD-10-CM | POA: Diagnosis not present

## 2022-02-25 DIAGNOSIS — Z046 Encounter for general psychiatric examination, requested by authority: Secondary | ICD-10-CM | POA: Diagnosis not present

## 2022-02-25 DIAGNOSIS — I509 Heart failure, unspecified: Secondary | ICD-10-CM | POA: Diagnosis not present

## 2022-02-25 LAB — ACETAMINOPHEN LEVEL: Acetaminophen (Tylenol), Serum: <10 ug/mL — ABNORMAL LOW (ref 10–30)

## 2022-02-25 LAB — COMPREHENSIVE METABOLIC PANEL
ALT: 16 U/L (ref 0–44)
AST: 18 U/L (ref 15–41)
Albumin: 4.2 g/dL (ref 3.5–5.0)
Alkaline Phosphatase: 64 U/L (ref 38–126)
Anion gap: 8 (ref 5–15)
BUN: 23 mg/dL (ref 8–23)
CO2: 25 mmol/L (ref 22–32)
Calcium: 9.4 mg/dL (ref 8.9–10.3)
Chloride: 107 mmol/L (ref 98–111)
Creatinine, Ser: 1.23 mg/dL (ref 0.61–1.24)
GFR, Estimated: 59 mL/min — ABNORMAL LOW (ref >60)
Glucose, Bld: 153 mg/dL — ABNORMAL HIGH (ref 70–99)
Potassium: 4 mmol/L (ref 3.5–5.1)
Sodium: 140 mmol/L (ref 135–145)
Total Bilirubin: 1.1 mg/dL (ref 0.3–1.2)
Total Protein: 7.4 g/dL (ref 6.5–8.1)

## 2022-02-25 LAB — ETHANOL: Alcohol, Ethyl (B): <10 mg/dL (ref <10)

## 2022-02-25 LAB — CBC
HCT: 47.2 % (ref 39.0–52.0)
Hemoglobin: 15.1 g/dL (ref 13.0–17.0)
MCH: 29.5 pg (ref 26.0–34.0)
MCHC: 32 g/dL (ref 30.0–36.0)
MCV: 92.4 fL (ref 80.0–100.0)
Platelets: 231 10*3/uL (ref 150–400)
RBC: 5.11 MIL/uL (ref 4.22–5.81)
RDW: 12.6 % (ref 11.5–15.5)
WBC: 10.5 10*3/uL (ref 4.0–10.5)
nRBC: 0 % (ref 0.0–0.2)

## 2022-02-25 LAB — SALICYLATE LEVEL: Salicylate Lvl: <7 mg/dL — ABNORMAL LOW (ref 7.0–30.0)

## 2022-02-25 LAB — CBG MONITORING, ED
Glucose-Capillary: 173 mg/dL — ABNORMAL HIGH (ref 70–99)
Glucose-Capillary: 251 mg/dL — ABNORMAL HIGH (ref 70–99)

## 2022-02-25 IMAGING — DX DG CHEST 1V PORT
1 series · 1 of 1 positions shown · non-contrast
Comparison: 12/23/2017

CLINICAL DATA: 79-year-old male with hypoxia.

EXAM:
PORTABLE CHEST 1 VIEW

[chest ap]
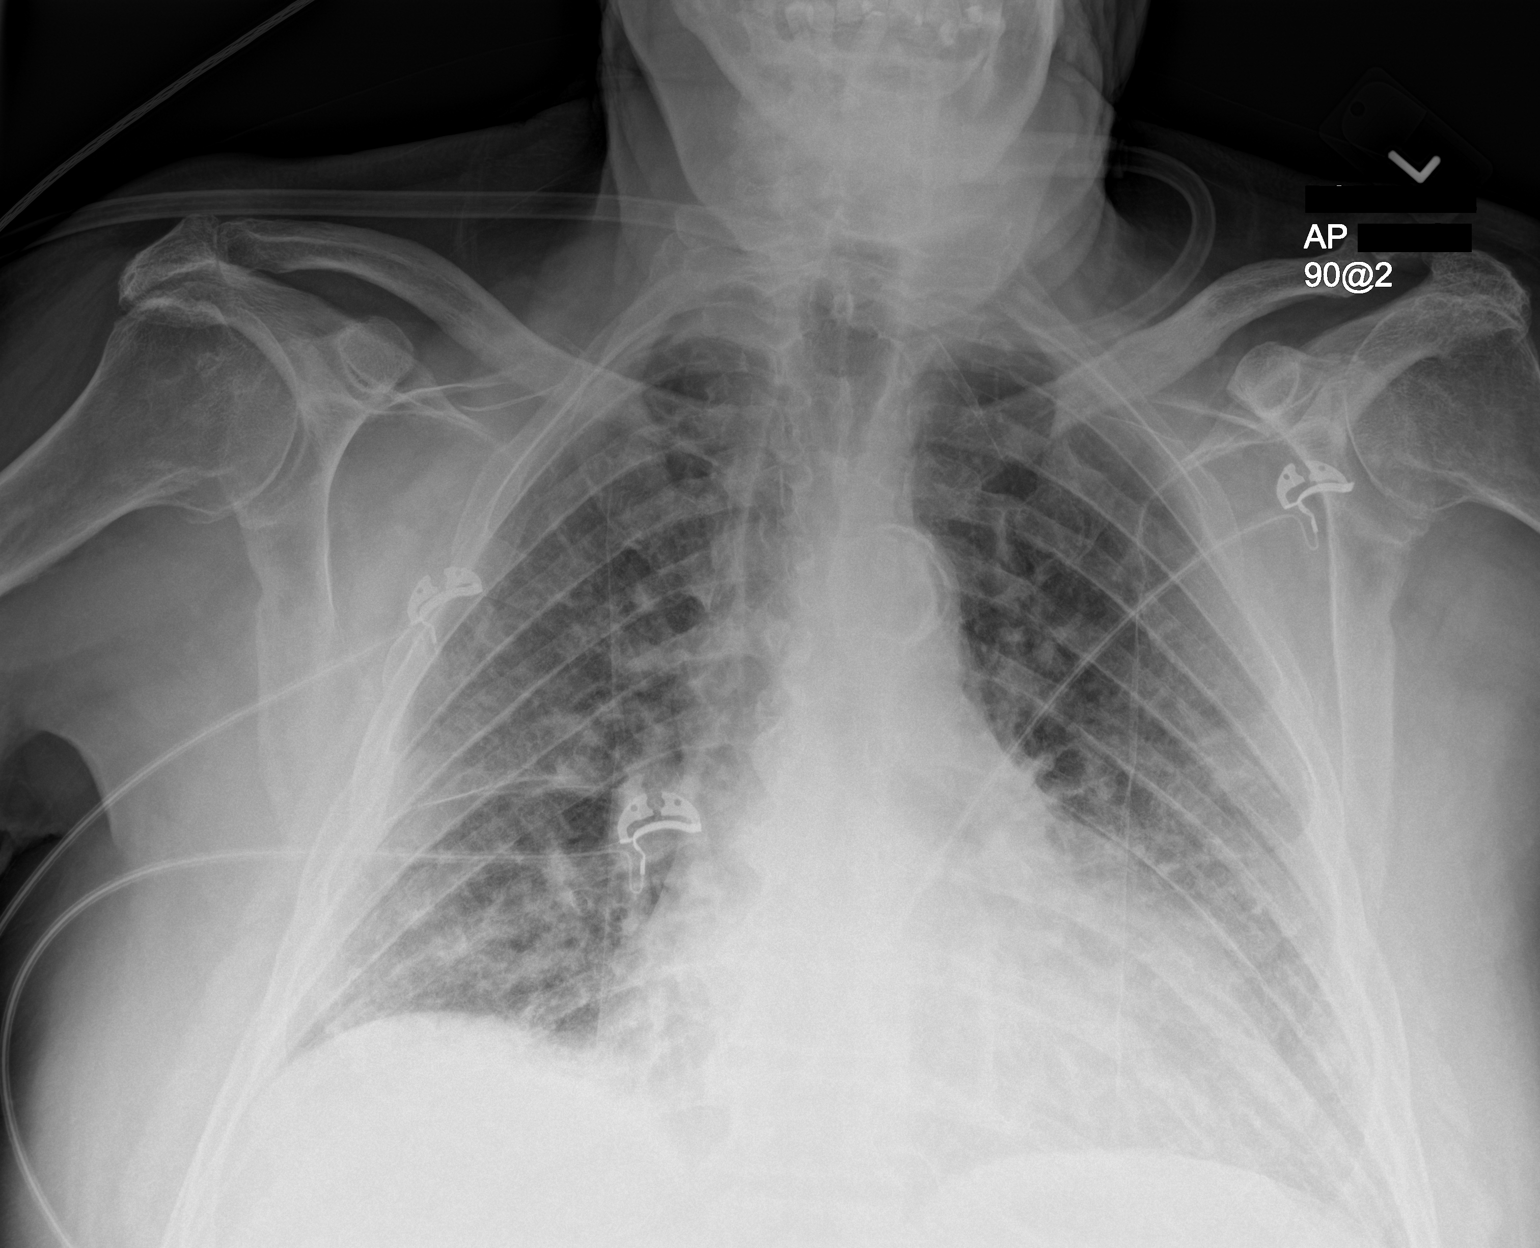

[1 of 1 positions shown; findings below may reference images not displayed]

FINDINGS: UPPER limits normal heart size noted.

Bilateral interstitial/airspace opacities are noted.

No large pleural effusion or pneumothorax noted.

No acute bony abnormalities are identified.
IMPRESSION: Bilateral interstitial/airspace opacities - question edema versus
infection.

## 2022-02-25 MED ORDER — ASPIRIN 81 MG PO TBEC
81.0000 mg | DELAYED_RELEASE_TABLET | Freq: Every day | ORAL | Status: DC
  Administered 2022-02-25 – 2022-03-10 (×14): 81 mg via ORAL
  Filled 2022-02-25 (×14): qty 1

## 2022-02-25 MED ORDER — INSULIN ASPART 100 UNIT/ML IJ SOLN
0.0000 [IU] | Freq: Every day | INTRAMUSCULAR | Status: DC
  Administered 2022-02-25: 3 [IU] via SUBCUTANEOUS
  Administered 2022-02-27: 4 [IU] via SUBCUTANEOUS
  Administered 2022-02-28: 5 [IU] via SUBCUTANEOUS
  Administered 2022-03-01: 4 [IU] via SUBCUTANEOUS
  Administered 2022-03-02: 2 [IU] via SUBCUTANEOUS
  Administered 2022-03-03: 4 [IU] via SUBCUTANEOUS
  Administered 2022-03-04: 5 [IU] via SUBCUTANEOUS
  Administered 2022-03-05 – 2022-03-06 (×2): 2 [IU] via SUBCUTANEOUS
  Administered 2022-03-07: 3 [IU] via SUBCUTANEOUS
  Administered 2022-03-08: 2 [IU] via SUBCUTANEOUS
  Administered 2022-03-09: 3 [IU] via SUBCUTANEOUS
  Filled 2022-02-25 (×13): qty 1

## 2022-02-25 MED ORDER — INSULIN ASPART 100 UNIT/ML IJ SOLN
0.0000 [IU] | Freq: Three times a day (TID) | INTRAMUSCULAR | Status: DC
  Administered 2022-02-25: 3 [IU] via SUBCUTANEOUS
  Administered 2022-02-26: 5 [IU] via SUBCUTANEOUS
  Administered 2022-02-26: 3 [IU] via SUBCUTANEOUS
  Administered 2022-02-26: 8 [IU] via SUBCUTANEOUS
  Administered 2022-02-27: 3 [IU] via SUBCUTANEOUS
  Administered 2022-02-27 – 2022-02-28 (×3): 15 [IU] via SUBCUTANEOUS
  Administered 2022-02-28: 5 [IU] via SUBCUTANEOUS
  Administered 2022-02-28: 15 [IU] via SUBCUTANEOUS
  Administered 2022-03-01 – 2022-03-02 (×4): 11 [IU] via SUBCUTANEOUS
  Administered 2022-03-02: 8 [IU] via SUBCUTANEOUS
  Administered 2022-03-02: 5 [IU] via SUBCUTANEOUS
  Administered 2022-03-03: 8 [IU] via SUBCUTANEOUS
  Administered 2022-03-03: 3 [IU] via SUBCUTANEOUS
  Administered 2022-03-03: 5 [IU] via SUBCUTANEOUS
  Administered 2022-03-04: 11 [IU] via SUBCUTANEOUS
  Administered 2022-03-04: 8 [IU] via SUBCUTANEOUS
  Administered 2022-03-04 – 2022-03-05 (×2): 5 [IU] via SUBCUTANEOUS
  Administered 2022-03-05: 11 [IU] via SUBCUTANEOUS
  Administered 2022-03-05: 5 [IU] via SUBCUTANEOUS
  Administered 2022-03-06 (×2): 11 [IU] via SUBCUTANEOUS
  Administered 2022-03-06 – 2022-03-07 (×2): 3 [IU] via SUBCUTANEOUS
  Administered 2022-03-07: 11 [IU] via SUBCUTANEOUS
  Administered 2022-03-07 – 2022-03-08 (×2): 15 [IU] via SUBCUTANEOUS
  Administered 2022-03-08: 5 [IU] via SUBCUTANEOUS
  Administered 2022-03-09 (×2): 8 [IU] via SUBCUTANEOUS
  Administered 2022-03-10 (×2): 5 [IU] via SUBCUTANEOUS
  Filled 2022-02-25 (×37): qty 1

## 2022-02-25 MED ORDER — QUETIAPINE FUMARATE 25 MG PO TABS: 25.0000 mg | ORAL_TABLET | Freq: Two times a day (BID) | ORAL | Status: DC | PRN

## 2022-02-25 MED ORDER — DONEPEZIL HCL 5 MG PO TABS
10.0000 mg | ORAL_TABLET | Freq: Every day | ORAL | Status: DC
  Administered 2022-02-25 – 2022-03-09 (×13): 10 mg via ORAL
  Filled 2022-02-25 (×13): qty 2

## 2022-02-25 MED ORDER — QUETIAPINE FUMARATE 25 MG PO TABS
25.0000 mg | ORAL_TABLET | Freq: Every day | ORAL | Status: DC
  Administered 2022-02-25 – 2022-03-09 (×13): 25 mg via ORAL
  Filled 2022-02-25 (×13): qty 1

## 2022-02-25 MED ORDER — ATORVASTATIN CALCIUM 20 MG PO TABS
80.0000 mg | ORAL_TABLET | Freq: Every day | ORAL | Status: DC
  Administered 2022-02-26 – 2022-03-10 (×13): 80 mg via ORAL
  Filled 2022-02-25 (×13): qty 4

## 2022-02-25 NOTE — ED Notes (Signed)
Pt refused nighttime snack.

## 2022-02-25 NOTE — Consult Note (Signed)
[facility] Consult   Reason for Consult: dementia with agitation  Referring Physician:  Charna Archer Patient Identification: Scott Lawson. MRN:  [id number] Principal Diagnosis: Dementia with behavioral disturbance (HCC) Diagnosis:  Principal Problem:   Dementia with behavioral disturbance (HCC)   Total Time spent with patient: 30 minutes  Subjective:"I was on my front porch minding my own business when the [place] came to get me to bring me here."   Scott Lawson. is a 81 y.o. male patient admitted with agitation .  HPI:  Patient has a past history of dementia with behavioral disturbance. This is the third presentation the ED since May 2023 for similar complaints of aggression toward family members, and symptoms of dementia.   According to involuntary commitment papers, DSS arrived at home  to take patient to a nursing facility; his home is being foreclosed on. The police were called because patient did not want to leave his home. Instead of taking him to the facility, patient was taken here to the ED.    On evaluation, patient is visibly upset, talking loudly, but sitting on his bed and  is not physically aggressive. He appears to have adequate hygiene. He is alert and oriented to person, place, time. Makes good eye contact, speaking in linear sentences, albeit expressing delusional thoughts. 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 He believes his family is plotting against him to get all of his money. Patient denies any suicidal thoughts, reporting that he has 4 women to chase around and he wants to live. Denies homicidal thoughts. He does not appear to be responding to internal stimuli, but is presenting with delusional belief of family plotting against him to take his possessions.   Patient does not meet criteria for inpatient psychiatric hospitalization.   Past Psychiatric  History: dementia with behavioral disturbance  Risk to Self:   Risk to Others:   Prior Inpatient Therapy:   Prior Outpatient Therapy:    Past Medical History:  Past Medical History:  Diagnosis Date   BPH (benign prostatic hyperplasia)    Dementia ([place])    Diabetes mellitus without complication (HCC)    Elevated PSA    GERD (gastroesophageal reflux disease)    Hypertension    Hypertriglyceridemia    Nocturia     Past Surgical History:  Procedure Laterality Date   APPENDECTOMY     CARPAL TUNNEL RELEASE Left 12/11/2015   Procedure: CARPAL TUNNEL RELEASE;  Surgeon: Earnestine Leys, MD;  Location: [facility] ORS;  Service: Orthopedics;  Laterality: Left;   EYE SURGERY     laser   TRANSURETHRAL RESECTION OF PROSTATE N/A 08/02/2017   Procedure: TRANSURETHRAL RESECTION OF THE PROSTATE (TURP);  Surgeon: Hollice Espy, MD;  Location: [facility] ORS;  Service: Urology;  Laterality: N/A;   Family History:  Family History  Problem Relation Age of Onset   Cancer Mother        lung   Heart disease Father    Diabetes Brother    Prostate cancer Neg Hx    Bladder Cancer Neg Hx    Kidney cancer Neg Hx    Family Psychiatric  History:  Social History:  Social History   Substance and Sexual Activity  Alcohol Use No   Alcohol/week: 0.0 standard drinks of alcohol     Social History   Substance and Sexual Activity  Drug Use No  Social History   Socioeconomic History   Marital status: Married    Spouse name: Not on file   Number of children: Not on file   Years of education: Not on file   Highest education level: High school graduate  Occupational History   Occupation: retired  Tobacco Use   Smoking status: Never   Smokeless tobacco: Never  Vaping Use   Vaping Use: Never used  Substance and Sexual Activity   Alcohol use: No    Alcohol/week: 0.0 standard drinks of alcohol   Drug use: No   Sexual activity: Yes  Other Topics Concern   Not on file  Social History Narrative   Not on  file   Social Determinants of Health   Financial Resource Strain: Low Risk  (03/13/2021)   Overall Financial Resource Strain (CARDIA)    Difficulty of Paying Living Expenses: Not hard at all  Food Insecurity: No Food Insecurity (03/11/2021)   Hunger Vital Sign    Worried About Running Out of Food in the Last Year: Never true    Stout in the Last Year: Never true  Transportation Needs: No Transportation Needs (03/13/2021)   PRAPARE - Hydrologist (Medical): No    Lack of Transportation (Non-Medical): No  Physical Activity: Sufficiently Active (03/13/2021)   Exercise Vital Sign    Days of Exercise per Week: 5 days    Minutes of Exercise per Session: 30 min  Stress: No Stress Concern Present (11/19/2017)   [place]    Feeling of Stress : Not at all  Social Connections: Moderately Isolated (03/13/2021)   Social Connection and Isolation Panel [NHANES]    Frequency of Communication with Friends and Family: More than three times a week    Frequency of Social Gatherings with Friends and Family: Three times a week    Attends Religious Services: More than 4 times per year    Active Member of Clubs or Organizations: No    Attends Archivist Meetings: Never    Marital Status: Widowed   Additional Social History:    Allergies:   Allergies  Allergen Reactions   Gabapentin Other (See Comments)    "made me fall out"    Labs:  Results for orders placed or performed during the hospital encounter of 02/25/22 (from the past 48 hour(s))  Comprehensive metabolic panel     Status: Abnormal   Collection Time: 02/25/22  3:52 PM  Result Value Ref Range   Sodium 140 135 - 145 mmol/L   Potassium 4.0 3.5 - 5.1 mmol/L   Chloride 107 98 - 111 mmol/L   CO2 25 22 - 32 mmol/L   Glucose, Bld 153 (H) 70 - 99 mg/dL    Comment: Glucose reference range applies only to samples taken after fasting  for at least 8 hours.   BUN 23 8 - 23 mg/dL   Creatinine, Ser 1.23 0.61 - 1.24 mg/dL   Calcium 9.4 8.9 - 10.3 mg/dL   Total Protein 7.4 6.5 - 8.1 g/dL   Albumin 4.2 3.5 - 5.0 g/dL   AST 18 15 - 41 U/L   ALT 16 0 - 44 U/L   Alkaline Phosphatase 64 38 - 126 U/L   Total Bilirubin 1.1 0.3 - 1.2 mg/dL   GFR, Estimated 59 (L) >60 mL/min    Comment: (NOTE) Calculated using the CKD-EPI Creatinine Equation (2021)    Anion gap 8  5 - 15    Comment: Performed at [facility], [address] [place], [place] [address]  Ethanol     Status: None   Collection Time: 02/25/22  3:52 PM  Result Value Ref Range   Alcohol, Ethyl (B) <10 <10 mg/dL    Comment: (NOTE) Lowest detectable limit for serum alcohol is 10 mg/dL.  For medical purposes only. Performed at [facility], [address] [place], [place] [address]   Salicylate level     Status: Abnormal   Collection Time: 02/25/22  3:52 PM  Result Value Ref Range   Salicylate Lvl <3.6 (L) 7.0 - 30.0 mg/dL    Comment: Performed at [facility], [address] [place], [place] [address]  Acetaminophen level     Status: Abnormal   Collection Time: 02/25/22  3:52 PM  Result Value Ref Range   Acetaminophen (Tylenol), Serum <10 (L) 10 - 30 ug/mL    Comment: (NOTE) Therapeutic concentrations vary significantly. A range of 10-30 ug/mL  may be an effective concentration for many patients. However, some  are best treated at concentrations outside of this range. Acetaminophen concentrations >150 ug/mL at 4 hours after ingestion  and >50 ug/mL at 12 hours after ingestion are often associated with  toxic reactions.  Performed at [facility], [address] [place], [place] [address]   cbc     Status: None   Collection Time: 02/25/22  3:52 PM  Result Value Ref Range   WBC 10.5 4.0 - 10.5 K/uL   RBC 5.11 4.22 - 5.81 MIL/uL   Hemoglobin 15.1 13.0 - 17.0 g/dL   HCT 47.2 39.0 - 52.0 %   MCV 92.4 80.0 - 100.0  fL   MCH 29.5 26.0 - 34.0 pg   MCHC 32.0 30.0 - 36.0 g/dL   RDW 12.6 11.5 - 15.5 %   Platelets 231 150 - 400 K/uL   nRBC 0.0 0.0 - 0.2 %    Comment: Performed at [facility], [address] [place], [place] [address]  CBG monitoring, ED     Status: Abnormal   Collection Time: 02/25/22  4:54 PM  Result Value Ref Range   Glucose-Capillary 173 (H) 70 - 99 mg/dL    Comment: Glucose reference range applies only to samples taken after fasting for at least 8 hours.    Current Facility-Administered Medications  Medication Dose Route Frequency Provider Last Rate Last Admin   aspirin EC tablet 81 mg  81 mg Oral Daily Blake Divine, MD       atorvastatin (LIPITOR) tablet 80 mg  80 mg Oral Daily Blake Divine, MD       cyanocobalamin ((VITAMIN B-12)) injection 1,000 mcg  1,000 mcg Intramuscular Q30 days Johnson, Megan P, DO       donepezil (ARICEPT) tablet 10 mg  10 mg Oral QHS Blake Divine, MD       insulin aspart (novoLOG) injection 0-15 Units  0-15 Units Subcutaneous TID WC Blake Divine, MD   3 Units at 02/25/22 1751   insulin aspart (novoLOG) injection 0-5 Units  0-5 Units Subcutaneous QHS Blake Divine, MD       QUEtiapine (SEROQUEL) tablet 25 mg  25 mg Oral QHS Blake Divine, MD       Current Outpatient Medications  Medication Sig Dispense Refill   aspirin EC 81 MG tablet Take 1 tablet (81 mg total) by mouth daily. Swallow whole. 150 tablet 2   atorvastatin (LIPITOR) 80 MG tablet  Take 1 tablet (80 mg total) by mouth daily. 90 tablet 1   donepezil (ARICEPT) 10 MG tablet Take 1 tablet (10 mg total) by mouth at bedtime. 30 tablet 2   HUMALOG 100 UNIT/ML injection Inject 15 Units into the skin 3 (three) times daily.     insulin glargine (LANTUS) 100 UNIT/ML injection Inject 0.35 mLs (35 Units total) into the skin every evening. 1800 10 mL 11   QUEtiapine (SEROQUEL) 25 MG tablet Take 1 tablet (25 mg total) by mouth at bedtime. 30 tablet 2   Blood Glucose Monitoring  Suppl (ONETOUCH VERIO) w/Device KIT Use to check blood sugar 3 times a day and document results, bring to appointments.  Goal is <130 fasting blood sugar and <180 two hours after meals. 1 kit 0   glucose blood (ONETOUCH VERIO) test strip Use to check blood sugar 3 times a day and document results, bring to appointments.  Goal is <130 fasting blood sugar and <180 two hours after meals. 100 each 12   Lancets (ONETOUCH ULTRASOFT) lancets Use to check blood sugar 3 times a day and document results, bring to appointments.  Goal is <130 fasting blood sugar and <180 two hours after meals. 100 each 12    Musculoskeletal: Strength & Muscle Tone: within normal limits Gait & Station: normal Patient leans: N/A    Psychiatric Specialty Exam:  Presentation  General Appearance: Casual  Eye Contact:Good  Speech:Clear and Coherent  Speech Volume:Increased  Handedness:Right   Mood and Affect  Mood:Irritable  Affect:Congruent   Thought Process  Thought Processes:Goal Directed; Coherent  Descriptions of Associations:Loose  Orientation:Full (Time, Place and Person)  Thought Content:Illogical  History of Schizophrenia/Schizoaffective disorder:No  Duration of Psychotic Symptoms:N/A  Hallucinations:Hallucinations: None (denies)  Ideas of Reference:None; Delusions; Paranoia  Suicidal Thoughts:Suicidal Thoughts: No  Homicidal Thoughts:Homicidal Thoughts: No   Sensorium  Memory:Immediate Poor; Recent Poor  Judgment:Poor  Insight:Lacking   Executive Functions  Concentration:Fair  Attention Span:Fair  [place]   Psychomotor Activity  Psychomotor Activity:Psychomotor Activity: Normal   Assets  Assets:Financial Resources/Insurance; Resilience; Social Support   Sleep  Sleep:No data recorded  Physical Exam: Physical Exam Vitals and nursing note reviewed.  HENT:     Head: Normocephalic.     Nose: No congestion or  rhinorrhea.  Eyes:     General:        Right eye: No discharge.        Left eye: No discharge.  Cardiovascular:     Rate and Rhythm: Normal rate.  Pulmonary:     Effort: Pulmonary effort is normal.  Musculoskeletal:     Cervical back: Normal range of motion.  Skin:    General: Skin is dry.  Neurological:     Mental Status: He is alert and oriented to person, place, and time.    Review of Systems  Constitutional: Negative.   Respiratory: Negative.    Musculoskeletal: Negative.   Psychiatric/Behavioral:  Positive for memory loss. Negative for depression, hallucinations, substance abuse and suicidal ideas. The patient is nervous/anxious. The patient does not have insomnia.    Blood pressure (!) 176/157, pulse 99, temperature 97.7 F (36.5 C), temperature source Oral, resp. rate 18, SpO2 97 %. There is no height or weight on file to calculate BMI.  Treatment Plan Summary: Plan Patient with dementia, having behavioral disturbance and delusional thinking. Patient does not meet criteria for inpatient psychiatric hospitalization. Refer to [facility] to contact guardian and arrange for placement. EDP re-started  home medications.    Disposition: Patient does not meet criteria for psychiatric inpatient admission. TOC to coordinated with guardian for placement  Sherlon [facility], NP 02/25/2022 6:10 PM

## 2022-02-25 NOTE — ED Notes (Addendum)
[place] legal guardian.  Cell [phone] Office [phone]

## 2022-02-25 NOTE — BH Assessment (Signed)
Comprehensive Clinical Assessment (CCA) Screening, Triage and Referral Note  02/25/2022 Scott Lawson. [id number]  Scott Lawson., 81 year old male who presents to [facility] ED involuntarily for treatment. Per triage note, Pt to ED under IVC with BPD. IVC paperwork taken out by DSS. DSS workers went to residence to take him to facility and pt refused. Pt threatened DSS workers and refused to leave his property that is being foreclosed PT has hx of dementia. On arrival pt stating to this RN that he is here again for the same family issues and his family is out to get him.   During TTS assessment pt presents alert and oriented x 4, restless but cooperative, and mood-congruent with affect. The pt does not appear to be responding to internal or external stimuli. Neither is the pt presenting with any delusional thinking. Pt verified the information provided to triage RN.   Pt identifies his main complaint to be that he is not sure why he was brought to the ED. Patient is terribly upset but not aggressive. Patient states he was sitting at home and the police brought him to the hospital. Patient reports his house is not under foreclosure and he wants his stepson to come pick him up. Pt denies current SI/HI/AH/VH. Patient is coherent and uses linear sentences. Pt contracts for safety. Patient has been seen here at [facility] throughout the summer. DSS continues to file IVC paperwork because patient does not want to leave his home.     Per Barbaraann Share, NP, pt does not meet criteria for inpatient psychiatric admission.    Chief Complaint:  Chief Complaint  Patient presents with   IVC   Visit Diagnosis: Dementia with behavioral disturbance  Patient Reported Information How did you hear about [place]? DSS  What Is the Reason for Your Visit/Call Today? Patient reports he is not sure why he was brought to the ED. Per IVC paperwork, DDS states patient's home is being foreclosed and patient refusing to leave.  How  Long Has This Been Causing You Problems? 1-6 months  What Do You Feel Would Help You the Most Today? -- (Assessment only)   Have You Recently Had Any Thoughts About Hurting Yourself? No  Are You Planning to Commit Suicide/Harm Yourself At This time? No   Have you Recently Had Thoughts About Guy? No  Are You Planning to Harm Someone at This Time? No  Explanation: Pt threatened to physically assault his granddaughter if released.   Have You Used Any Alcohol or Drugs in the Past 24 Hours? No  How Long Ago Did You Use Drugs or Alcohol? No data recorded What Did You Use and How Much? No data recorded  Do You Currently Have a Therapist/Psychiatrist? No  Name of Therapist/Psychiatrist: No data recorded  Have You Been Recently Discharged From Any Office Practice or Programs? No  Explanation of Discharge From Practice/Program: No data recorded   CCA Screening Triage Referral Assessment Type of Contact: Face-to-Face  Telemedicine Service Delivery:   Is this Initial or Reassessment? No data recorded Date Telepsych consult ordered in [facility]:  No data recorded Time Telepsych consult ordered in [facility]:  No data recorded Location of Assessment: [facility] ED  Provider Location: [facility] ED   Collateral Involvement: None provided   Does Patient Have a [place]? No data recorded Name and Contact of Legal Guardian: No data recorded If Minor and Not Living with Parent(s), Who has Custody? n/a  Is CPS involved or  ever been involved? Never  Is APS involved or ever been involved? Currently   Patient Determined To Be At Risk for Harm To Self or Others Based on Review of Patient Reported Information or Presenting Complaint? No  Method: No Plan  Availability of Means: No access or NA  Intent: Vague intent or NA  Notification Required: No need or identified person  Additional Information for Danger to Others Potential: Previous attempts; Active  psychosis  Additional Comments for Danger to Others Potential: No data recorded Are There Guns or Other Weapons in [place]? No  Types of Guns/Weapons: No data recorded Are These Weapons Safely Secured?                            No data recorded Who Could Verify You Are Able To Have These Secured: No data recorded Do You Have any Outstanding Charges, Pending Court Dates, Parole/Probation? n/a  Contacted To Inform of Risk of Harm To Self or Others: No data recorded  Does Patient Present under Involuntary Commitment? Yes  IVC Papers Initial File Date: 02/25/22   [place] of Residence: [place]   Patient Currently Receiving the Following Services: Medication Management   Determination of Need: Emergent (2 hours)   Options For Referral: ED Referral   Discharge Disposition:     Eula Fried, Counselor, LCAS-A

## 2022-02-25 NOTE — ED Notes (Signed)
PT IVC/ does not meet criteria for inpatient psych hospitalization / TOC coordinated with guardian for placement

## 2022-02-25 NOTE — ED Triage Notes (Signed)
Pt to ED under IVC with BPD. IVC paperwork taken out by DSS. DSS workers went to residence to take him to facility and pt refused. Pt threatened DSS workers and refused to leave his property that is being foreclosed PT has hx of dementia.   On arrival pt stating to this RN that he is here again for the same family issues and his family is out to get him.   Pt dressed out by this RN and EDT: 1 blue shirt 1 pair brown pants 1 pair brown shoes 1 pair grey socks 1 pair white underwear

## 2022-02-25 NOTE — ED Notes (Signed)
IVC prior to arrival/ pending psych consult

## 2022-02-25 NOTE — ED Provider Notes (Signed)
[facility] Provider Note    Event Date/Time   First MD Initiated Contact with Patient 02/25/22 1600     (approximate)   History   Chief Complaint IVC   HPI  Scott Lawson. is a 81 y.o. male with past medical history of hypertension, diabetes, CKD, CHF, and dementia who presents to the ED for psychiatric evaluation.  Patient states that he is here because "it is all about money" and his family is trying to take his home from him.  He currently denies any complaints, states he feels well with no suicidal or homicidal ideation.  He arrives under IVC and per paperwork, DSS had arrived at his home today as it was being foreclosed upon and he was being moved to a nursing facility.  At that time, he became aggressive, threatening to harm DSS staff.  He was placed under IVC and brought to the ED for further evaluation.     Physical Exam   Triage Vital Signs: ED Triage Vitals  Enc Vitals Group     BP 02/25/22 1543 (!) 176/157     Pulse Rate 02/25/22 1543 99     Resp 02/25/22 1543 18     Temp 02/25/22 1543 97.7 F (36.5 C)     Temp Source 02/25/22 1543 Oral     SpO2 02/25/22 1543 97 %     Weight --      Height --      Head Circumference --      Peak Flow --      Pain Score 02/25/22 1552 0     Pain Loc --      Pain Edu? --      Excl. in [place]? --     Most recent vital signs: Vitals:   02/25/22 1543  BP: (!) 176/157  Pulse: 99  Resp: 18  Temp: 97.7 F (36.5 C)  SpO2: 97%    Constitutional: Awake and alert. Eyes: Conjunctivae are normal. Head: Atraumatic. Nose: No congestion/rhinnorhea. Mouth/Throat: Mucous membranes are moist.  Cardiovascular: Normal rate, regular rhythm. Grossly normal heart sounds.  2+ radial pulses bilaterally. Respiratory: Normal respiratory effort.  No retractions. Lungs CTAB. Gastrointestinal: Soft and nontender. No distention. Musculoskeletal: No lower extremity tenderness nor edema.  Neurologic:  Normal speech  and language. No gross focal neurologic deficits are appreciated.    ED Results / Procedures / Treatments   Labs (all labs ordered are listed, but only abnormal results are displayed) Labs Reviewed  COMPREHENSIVE METABOLIC PANEL - Abnormal; Notable for the following components:      Result Value   Glucose, Bld 153 (*)    GFR, Estimated 59 (*)    All other components within normal limits  SALICYLATE LEVEL - Abnormal; Notable for the following components:   Salicylate Lvl <2.9 (*)    All other components within normal limits  ACETAMINOPHEN LEVEL - Abnormal; Notable for the following components:   Acetaminophen (Tylenol), Serum <10 (*)    All other components within normal limits  CBG MONITORING, ED - Abnormal; Notable for the following components:   Glucose-Capillary 173 (*)    All other components within normal limits  RESP PANEL BY RT-PCR (FLU A&B, COVID) ARPGX2  ETHANOL  CBC  URINE DRUG SCREEN, QUALITATIVE ([facility] ONLY)    PROCEDURES:  Critical Care performed: No  Procedures   MEDICATIONS ORDERED IN ED: Medications  aspirin EC tablet 81 mg (has no administration in time range)  atorvastatin (LIPITOR) tablet 80  mg (has no administration in time range)  donepezil (ARICEPT) tablet 10 mg (has no administration in time range)  QUEtiapine (SEROQUEL) tablet 25 mg (has no administration in time range)  insulin aspart (novoLOG) injection 0-15 Units (has no administration in time range)  insulin aspart (novoLOG) injection 0-5 Units (has no administration in time range)     IMPRESSION / MDM / ASSESSMENT AND PLAN / ED COURSE  I reviewed the triage vital signs and the nursing notes.                              81 y.o. male with past medical history of hypertension, diabetes, CKD, CHF, and dementia who presents to the ED for psychiatric evaluation after he became aggressive with DSS staff who was attempting to remove him from his home that is being foreclosed upon.  Patient's  presentation is most consistent with acute presentation with potential threat to life or bodily function.  Differential diagnosis includes, but is not limited to, psychosis, dementia, suicidal ideation, homicidal ideation, electrolyte abnormality.  Patient is well-appearing and in no acute distress, vital signs remarkable for elevated blood pressure but otherwise reassuring.  He denies any medical complaints and is under the impression that he is here due to his family attempting to steal from him.  Screening labs are unremarkable with no significant electrolyte abnormality, AKI, anemia, or leukocytosis.  Patient may be medically cleared for psychiatric evaluation, would also likely benefit from social work consult.  The patient has been placed in psychiatric observation due to the need to provide a safe environment for the patient while obtaining psychiatric consultation and evaluation, as well as ongoing medical and medication management to treat the patient's condition.  The patient has been placed under full IVC at this time.      FINAL CLINICAL IMPRESSION(S) / ED DIAGNOSES   Final diagnoses:  Dementia with behavioral disturbance ([place])     Rx / DC Orders   ED Discharge Orders     None        Note:  This document was prepared using Dragon voice recognition software and may include unintentional dictation errors.   Blake Divine, MD 02/25/22 [id number]

## 2022-02-26 DIAGNOSIS — F03918 Unspecified dementia, unspecified severity, with other behavioral disturbance: Secondary | ICD-10-CM | POA: Diagnosis not present

## 2022-02-26 LAB — CBG MONITORING, ED
Glucose-Capillary: 248 mg/dL — ABNORMAL HIGH (ref 70–99)
Glucose-Capillary: 289 mg/dL — ABNORMAL HIGH (ref 70–99)
Glucose-Capillary: 187 mg/dL — ABNORMAL HIGH (ref 70–99)

## 2022-02-26 LAB — URINE DRUG SCREEN, QUALITATIVE (ARMC ONLY)
Amphetamines, Ur Screen: NOT DETECTED
Barbiturates, Ur Screen: NOT DETECTED
Benzodiazepine, Ur Scrn: NOT DETECTED
Cannabinoid 50 Ng, Ur ~~LOC~~: NOT DETECTED
Cocaine Metabolite,Ur ~~LOC~~: NOT DETECTED
MDMA (Ecstasy)Ur Screen: NOT DETECTED
Methadone Scn, Ur: NOT DETECTED
Opiate, Ur Screen: NOT DETECTED
Phencyclidine (PCP) Ur S: NOT DETECTED
Tricyclic, Ur Screen: NOT DETECTED

## 2022-02-26 LAB — RESP PANEL BY RT-PCR (FLU A&B, COVID) ARPGX2
Influenza A by PCR: NEGATIVE
Influenza B by PCR: NEGATIVE
SARS Coronavirus 2 by RT PCR: NEGATIVE

## 2022-02-26 IMAGING — US US EXTREM LOW VENOUS
1 series · 13 of 24 positions shown · non-contrast
Comparison: None.

CLINICAL DATA: 79-year-old male with elevated Tefera, Ramata,
hypoxia, NKC13-GC positive.



[Series 1: us extrem low venous · 0.11mm/px · 79 acquisitions, 13 frames shown]
[im 1/79]
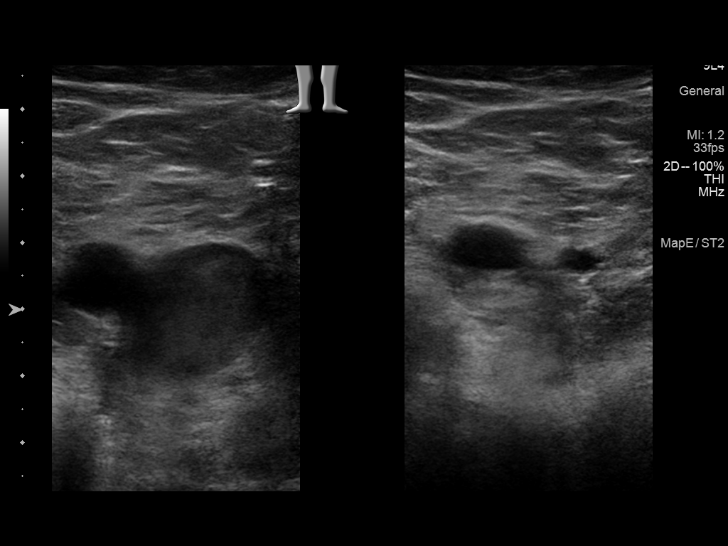
[im 7/79]
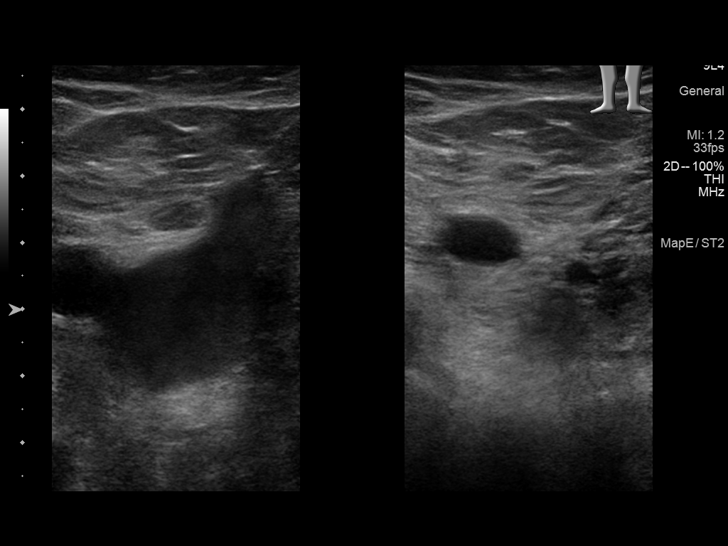
[im 14/79]
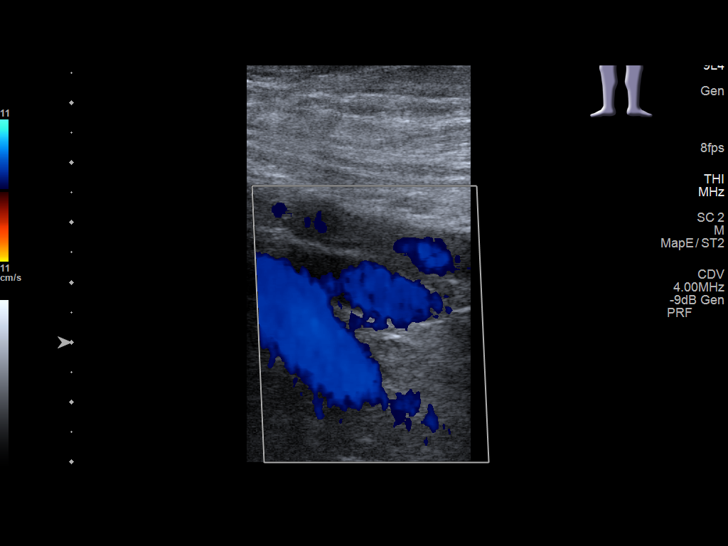
[im 21/79]
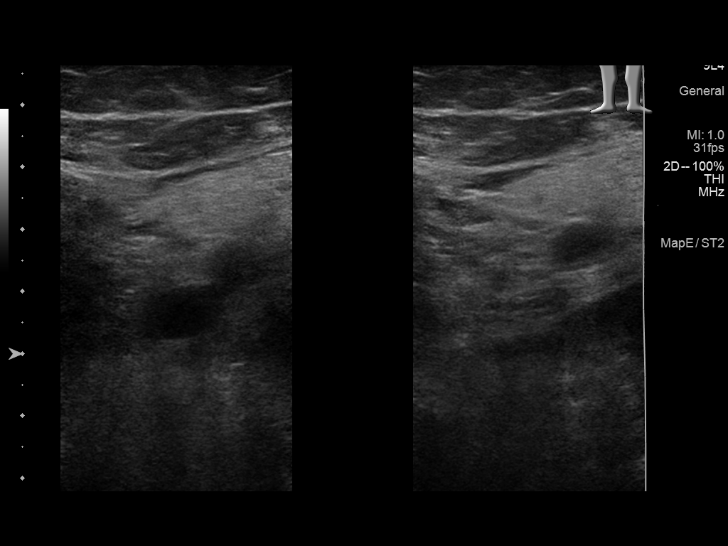
[im 28/79]
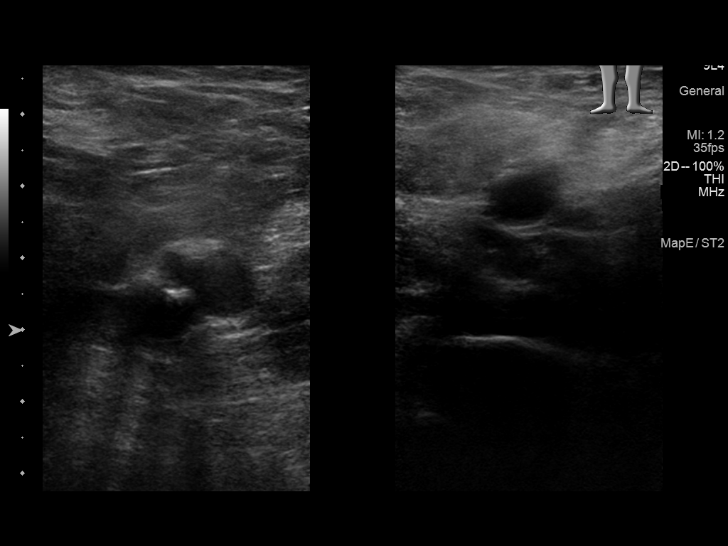
[im 34/79]
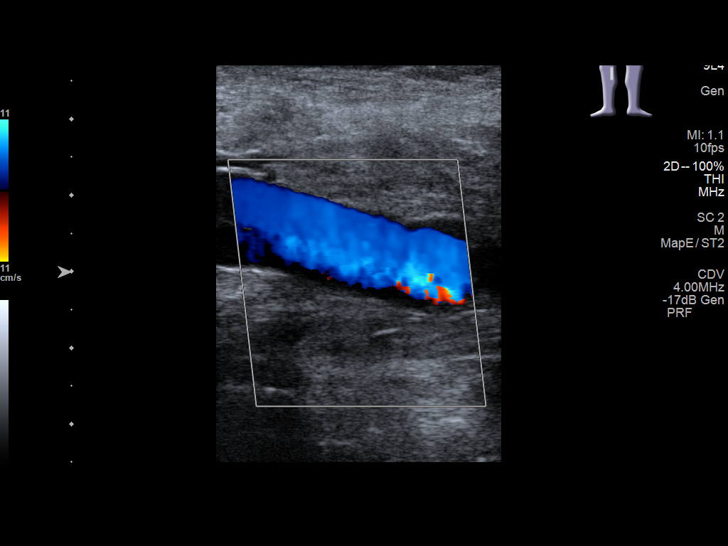
[im 45/79]
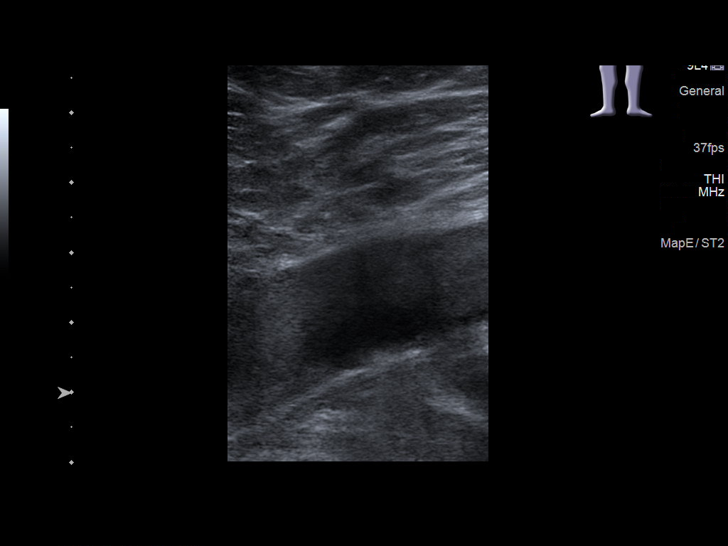
[im 45/79]
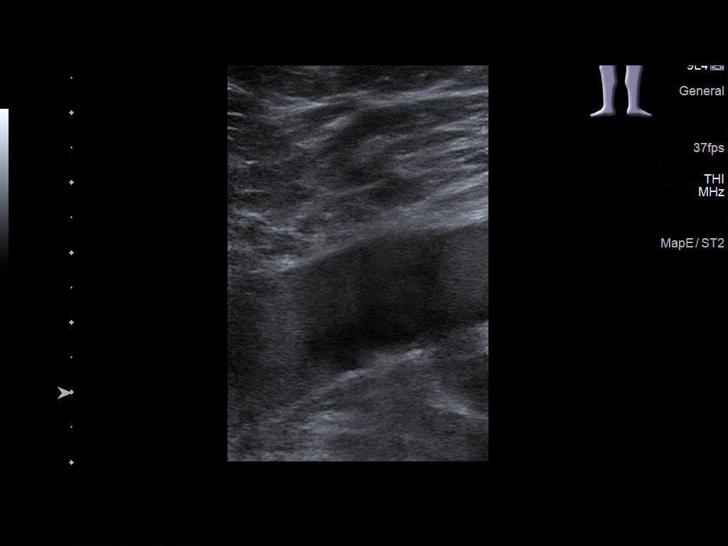
[im 51/79]
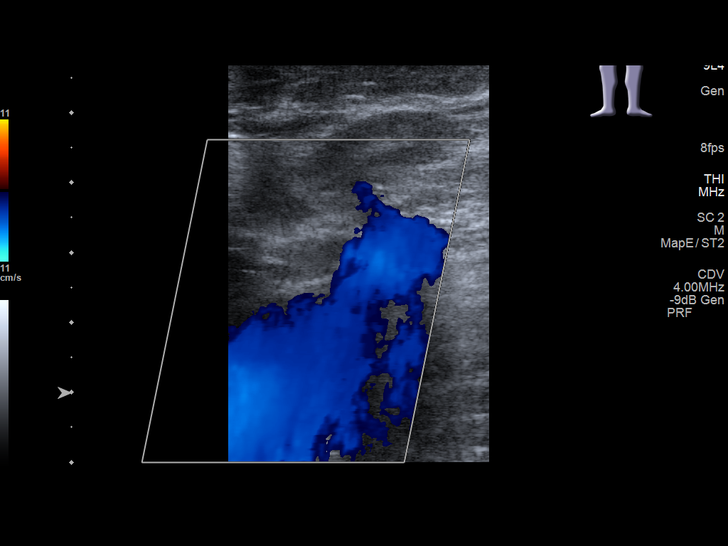
[im 58/79]
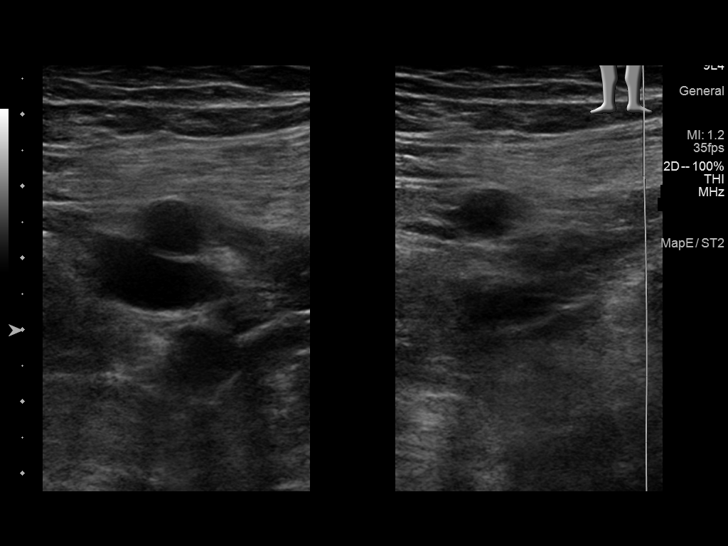
[im 65/79]
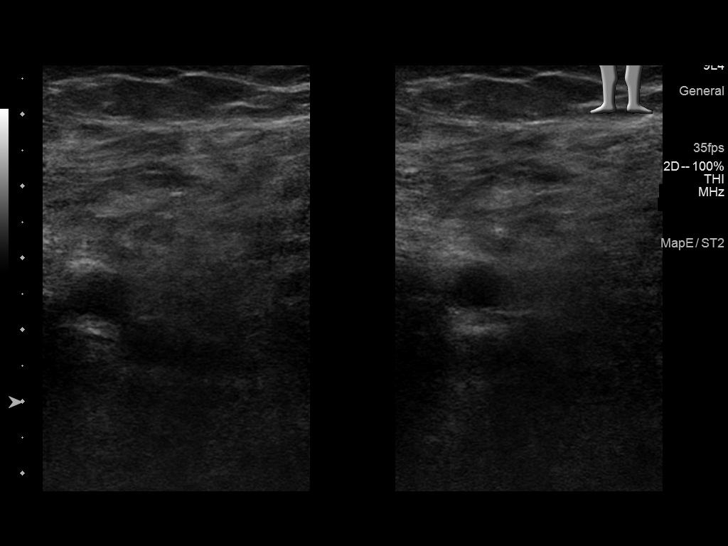
[im 72/79]
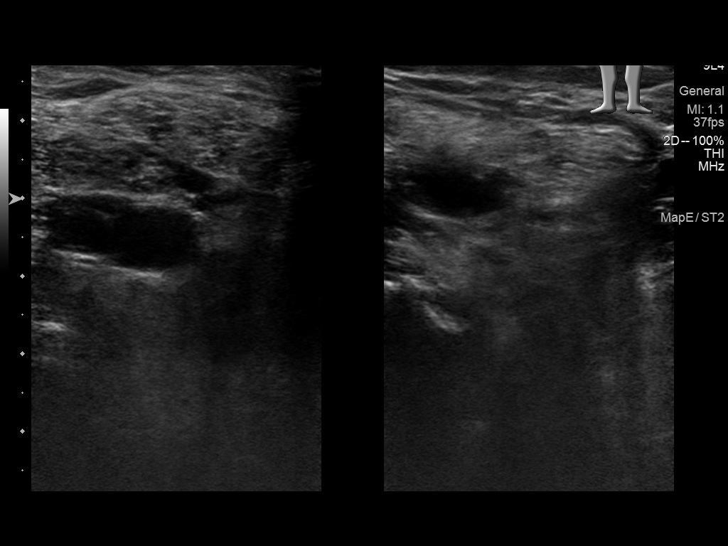
[im 79/79]
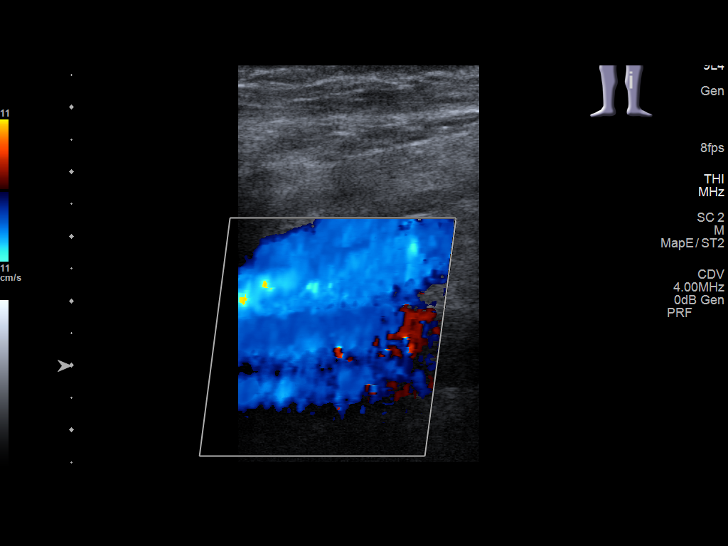

[13 of 24 positions shown; findings below may reference images not displayed]

FINDINGS: RIGHT LOWER EXTREMITY

Common Femoral Vein: No evidence of thrombus. Normal
compressibility, respiratory phasicity and response to augmentation.

Saphenofemoral Junction: No evidence of thrombus. Normal
compressibility and flow on color Doppler imaging.

Profunda Femoral Vein: No evidence of thrombus. Normal
compressibility and flow on color Doppler imaging.

Femoral Vein: No evidence of thrombus. Normal compressibility,
respiratory phasicity and response to augmentation.

Popliteal Vein: No evidence of thrombus. Normal compressibility,
respiratory phasicity and response to augmentation.

Calf Veins: No evidence of thrombus. Normal compressibility and flow
on color Doppler imaging.

Superficial Great Saphenous Vein: No evidence of thrombus. Normal
compressibility.

Other Findings:  None.

LEFT LOWER EXTREMITY

Common Femoral Vein: No evidence of thrombus. Normal
compressibility, respiratory phasicity and response to augmentation.

Saphenofemoral Junction: No evidence of thrombus. Normal
compressibility and flow on color Doppler imaging.

Profunda Femoral Vein: No evidence of thrombus. Normal
compressibility and flow on color Doppler imaging.

Femoral Vein: No evidence of thrombus. Normal compressibility,
respiratory phasicity and response to augmentation.

Popliteal Vein: No evidence of thrombus. Normal compressibility,
respiratory phasicity and response to augmentation.

Calf Veins: No evidence of thrombus. Normal compressibility and flow
on color Doppler imaging.

Superficial Great Saphenous Vein: No evidence of thrombus. Normal
compressibility.

Other Findings:  None.
IMPRESSION: No evidence of bilateral lower extremity deep vein thrombosis.

## 2022-02-26 NOTE — ED Notes (Signed)
Pt given dinner tray.

## 2022-02-26 NOTE — ED Notes (Signed)
Patient provided snack at appropriate snack time.  Pt consumed 100% of snack provided, tolerated well w/o complaints   Trash disposted of appropriately by patient.  

## 2022-02-26 NOTE — TOC Initial Note (Signed)
Transition of Care (TOC) - Initial/Assessment Note    Patient Details  Name: Scott Lawson. MRN: [id number] Date of Birth: Jan 06, 1941  Transition of Care [facility]) CM/SW Contact:    Shelbie Hutching, RN Phone Number: 02/26/2022, 1:44 PM  Clinical Narrative:                 Patient brought into the emergency room by PD yesterday evening due to aggressive behavior when [place] DSS went out to remove patient from his home and place him at an ALF.  Scott Lawson is the DSS Social worker [phone], she says that they had found patient placement with Scott Lawson at one of his care homes but after this incident Scott Lawson has rescinded his offer to accept patient.  Scott Lawson will be looking for alternative placement.  Patient will need Memory care.  He does not qualify for skilled nursing care. TOC will be assisting with placement.    Expected Discharge Plan: Memory Care Barriers to Discharge: ED Unsafe disposition   Patient Goals and CMS Choice   CMS Medicare.gov Compare Post Acute Care list provided to:: Legal Guardian Choice offered to / list presented to : [facility] POA / Guardian  Expected Discharge Plan and Services Expected Discharge Plan: Memory Care   Discharge Planning Services: CM Consult   Living arrangements for the past 2 months: Single Family Home                 DME Arranged: N/A DME Agency: NA       HH Arranged: NA HH Agency: NA        Prior Living Arrangements/Services Living arrangements for the past 2 months: Single Family Home Lives with:: Adult Children Patient language and need for interpreter reviewed:: Yes Do you feel safe going back to the place where you live?: No   DSS guardian says patient needs placement  Need for Family Participation in Patient Care: Yes (Comment) Care giver support system in place?: Yes (comment)   Criminal Activity/Legal Involvement Pertinent to Current Situation/Hospitalization: No - Comment as needed  Activities of Daily Living       Permission Sought/Granted Permission sought to share information with : Guardian, Customer service manager       Permission granted to share info w AGENCY: [place] [phone]        Emotional Assessment Appearance:: Appears stated age     Orientation: : Oriented to Self, Oriented to Place Alcohol / Substance Use: Not Applicable Psych Involvement: Yes (comment)  Admission diagnosis:  IVC Patient Active Problem List   Diagnosis Date Noted   Dementia with behavioral disturbance ([place]) 12/04/2021   Aggressive behavior 10/23/2021   Chronic diastolic CHF (congestive heart failure) ([place]) 02/04/2021   Emphysema lung ([place]) 10/26/2020   History of 2019 novel coronavirus disease (COVID-19) 10/26/2020   Interstitial lung disease ([place]) 08/24/2020   Chronic respiratory failure with hypoxia ([place]) 06/20/2020   Atherosclerosis of aorta ([place]) 10/12/2019   Long-term insulin use ([place]) 10/12/2019   B12 deficiency 06/28/2018   Prostate cancer ([place]) 06/27/2018   Psoriasis 08/16/2017   Advanced care planning/counseling discussion 12/01/2016   GERD without esophagitis 08/14/2015   Type 2 diabetes mellitus with diabetic chronic kidney disease ([place]) 08/13/2015   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[place]) 08/13/2015   Chronic kidney disease, stage 3a ([place]) 02/11/2015   Mixed hyperlipidemia due to type 2 diabetes mellitus ([place]) 02/11/2015   BPH (benign prostatic hyperplasia) 02/11/2015  PCP:  Venita Lick, NP Pharmacy:   [facility] [phone] - [address] [place] [address] Phone: [phone] Fax: [phone]     Social Determinants of Health (SDOH) Interventions    Readmission Risk Interventions     No data to display

## 2022-02-26 NOTE — ED Notes (Signed)
Pt given a sandwich tray and milk

## 2022-02-26 NOTE — ED Notes (Signed)
Hospital meal provided.  100% consumed, pt tolerated w/o complaints.  Waste discarded appropriately.    Encouraged patient to tidy room, provided trash can for patient to throw away any trash in patient room with staff supervision.

## 2022-02-26 NOTE — ED Provider Notes (Signed)
Emergency Medicine Observation Re-evaluation Note  Scott Lawson. is a 81 y.o. male, seen on rounds today.  Pt initially presented to the ED for complaints of IVC Currently, the patient is resting, voices no medical complaints.  Physical Exam  BP 138/81 (BP Location: Right Arm)   Pulse 83   Temp 98.5 F (36.9 C)   Resp 18   SpO2 97%  Physical Exam General: Resting in no acute distress Cardiac: No cyanosis Lungs: Equal rise and fall Psych: Not agitated  ED Course / MDM  EKG:   I have reviewed the labs performed to date as well as medications administered while in observation.  Recent changes in the last 24 hours include no events overnight.  Plan  Current plan is for psychiatric disposition.    Paulette Blanch, MD 02/26/22 [phone]

## 2022-02-26 NOTE — ED Notes (Signed)
Lunch given.

## 2022-02-26 NOTE — ED Notes (Signed)
Pt given a warm blanket 

## 2022-02-26 NOTE — ED Notes (Signed)
IVC/  PENDING  PLACEMENT 

## 2022-02-27 DIAGNOSIS — N183 Chronic kidney disease, stage 3 unspecified: Secondary | ICD-10-CM | POA: Diagnosis not present

## 2022-02-27 DIAGNOSIS — U071 COVID-19: Secondary | ICD-10-CM | POA: Diagnosis not present

## 2022-02-27 LAB — CBG MONITORING, ED
Glucose-Capillary: 362 mg/dL — ABNORMAL HIGH (ref 70–99)
Glucose-Capillary: 365 mg/dL — ABNORMAL HIGH (ref 70–99)
Glucose-Capillary: 156 mg/dL — ABNORMAL HIGH (ref 70–99)
Glucose-Capillary: 327 mg/dL — ABNORMAL HIGH (ref 70–99)

## 2022-02-27 NOTE — Progress Notes (Signed)
Inpatient Diabetes Program Recommendations  AACE/ADA: New Consensus Statement on Inpatient Glycemic Control (2015)  Target Ranges:  Prepandial:   less than 140 mg/dL      Peak postprandial:   less than 180 mg/dL (1-2 hours)      Critically ill patients:  140 - 180 mg/dL   Lab Results  Component Value Date   GLUCAP 362 (H) 02/27/2022   HGBA1C 10.1 (H) 12/04/2021    Review of Glycemic Control  Latest Reference Range & Units 02/26/22 11:57 02/26/22 16:16 02/27/22 09:03  Glucose-Capillary 70 - 99 mg/dL 289 (H) 187 (H) 362 (H)   Diabetes history:  DM 2 Outpatient Diabetes medications:  Lantus 35 units q PM, Humalog 15 units tid with meals Current orders for Inpatient glycemic control:  Novolog 0-15 units tid with meals and HS  Inpatient Diabetes Program Recommendations:    Please consider adding Lantus 25 units daily and Novolog meal coverage 5 units tid with meals (hold if patient eats less than 50% or NPO).    Thanks,  Adah Perl, RN, BC-ADM Inpatient Diabetes Coordinator Pager [phone]  (8a-5p)

## 2022-02-27 NOTE — ED Notes (Signed)
Meal given

## 2022-02-27 NOTE — ED Notes (Signed)
Hospital meal provided to pt.

## 2022-02-27 NOTE — ED Notes (Signed)
VOl  rescinded by NP  pending placement by [facility]

## 2022-02-27 NOTE — ED Notes (Signed)
Pt denies any SI or HI

## 2022-02-27 NOTE — ED Notes (Signed)
Report given to April, RN

## 2022-02-27 NOTE — TOC Progression Note (Signed)
Transition of Care (TOC) - Progression Note    Patient Details  Name: Scott Lawson. MRN: [id number] Date of Birth: 02/06/1941  Transition of Care [facility]) CM/SW Contact  Shelbie Hutching, RN Phone Number: 02/27/2022, 2:38 PM  Clinical Narrative:    Received an update from [place], they have not found placement yet, she reports that she has been working all day yesterday and today and hopefully they will have found something by Monday.    Expected Discharge Plan: Memory Care Barriers to Discharge: ED Unsafe disposition  Expected Discharge Plan and Services Expected Discharge Plan: Memory Care   Discharge Planning Services: CM Consult   Living arrangements for the past 2 months: Single Family Home                 DME Arranged: N/A DME Agency: NA       HH Arranged: NA HH Agency: NA         Social Determinants of Health (SDOH) Interventions    Readmission Risk Interventions     No data to display

## 2022-02-27 NOTE — ED Notes (Signed)
Report to Heather, RN.

## 2022-02-27 NOTE — ED Notes (Signed)
Rescinded by Waldon Merl NP [phone]

## 2022-02-27 NOTE — ED Notes (Signed)
Ivc /pending placement 

## 2022-02-27 NOTE — Consult Note (Signed)
  Patient is  calm, cooperative, no unsafe behavior. Denies SI/HI/AVH. IVC released. TOC working with DSS for placement.   Sherlon Handing, PMHNP-BC

## 2022-02-27 NOTE — ED Notes (Signed)
Hospital meal provided.  100% consumed, pt tolerated w/o complaints.  Waste discarded appropriately.   

## 2022-02-28 LAB — CBG MONITORING, ED
Glucose-Capillary: 351 mg/dL — ABNORMAL HIGH (ref 70–99)
Glucose-Capillary: 235 mg/dL — ABNORMAL HIGH (ref 70–99)
Glucose-Capillary: 352 mg/dL — ABNORMAL HIGH (ref 70–99)
Glucose-Capillary: 381 mg/dL — ABNORMAL HIGH (ref 70–99)

## 2022-02-28 IMAGING — DX DG CHEST 1V PORT
1 series · 1 of 1 positions shown · non-contrast
Comparison: Three days ago

CLINICAL DATA: Acute respiratory failure.  COVID.

EXAM:
PORTABLE CHEST 1 VIEW

[chest ap]
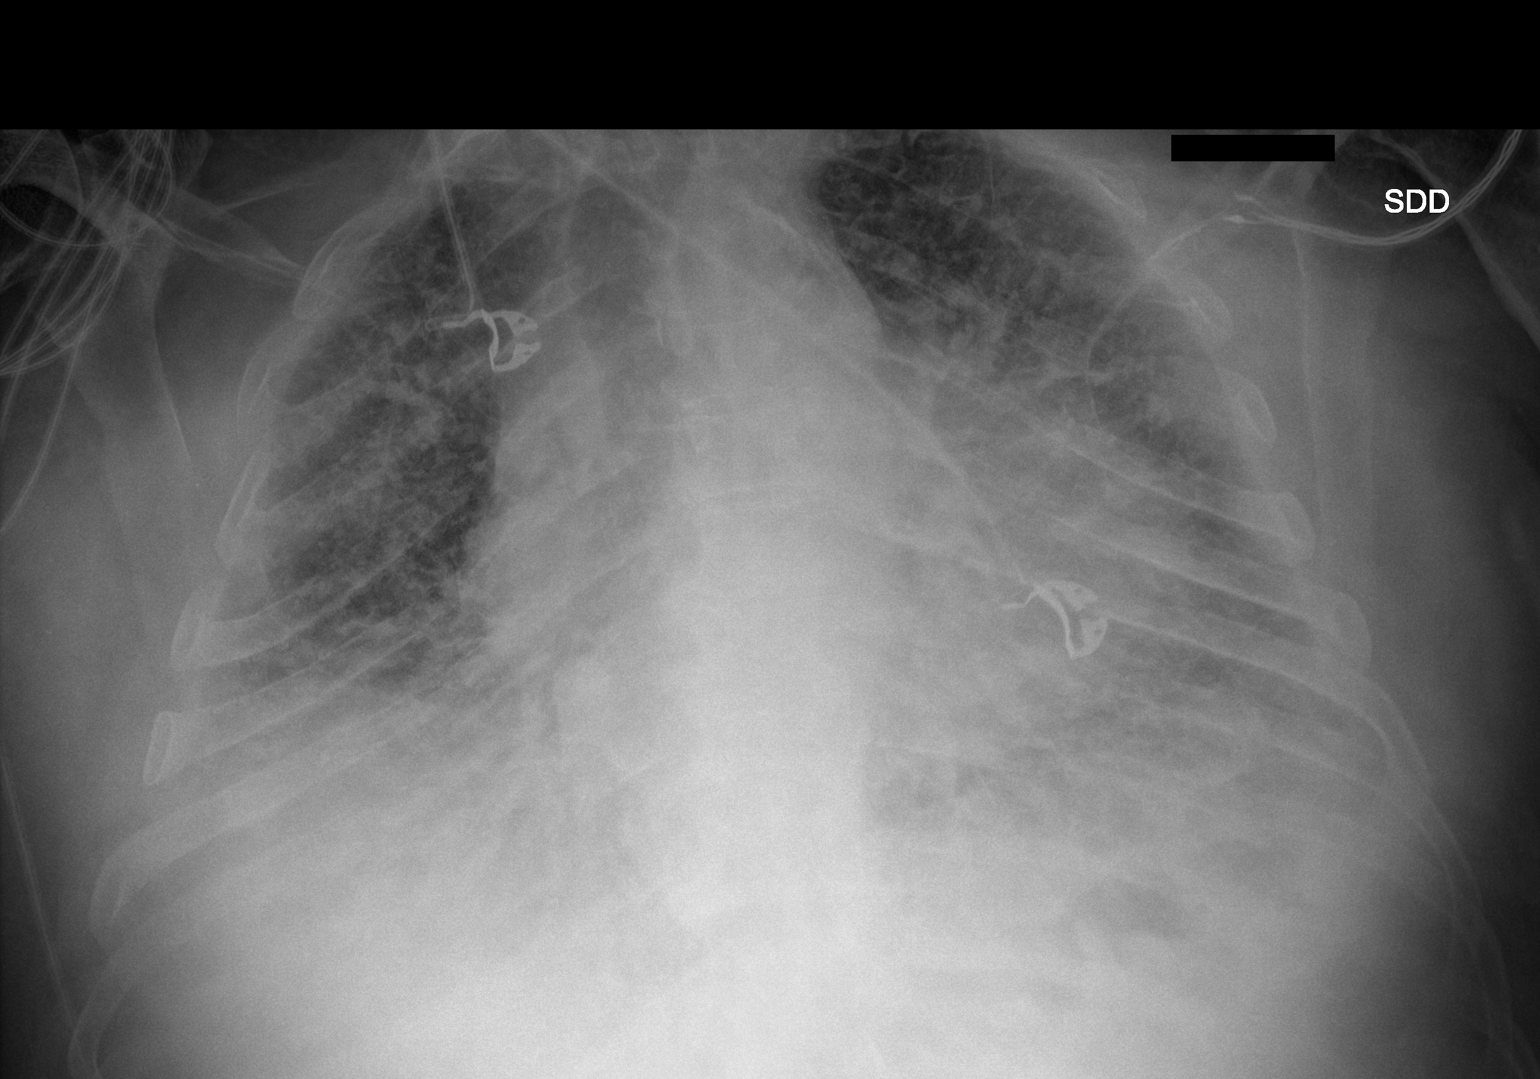

[1 of 1 positions shown; findings below may reference images not displayed]

FINDINGS: Cardiomegaly. Low lung volumes accentuated by apical lordotic
positioning. Diffuse pulmonary opacification. No visible effusion or
air leak.
IMPRESSION: Stable low volume chest with diffuse opacification. There is
cardiomegaly and symmetric opacity, edema could contribute to the
presumed COVID pneumonia.

## 2022-02-28 NOTE — ED Notes (Signed)
Patient was given sandwhich tray, chocolate icecream and a cup of soda.

## 2022-02-28 NOTE — ED Notes (Signed)
Report to ashley, rn

## 2022-02-28 NOTE — ED Provider Notes (Signed)
-----------------------------------------   5:45 AM on 02/28/2022 -----------------------------------------   Blood pressure 109/74, pulse 74, temperature 98.2 F (36.8 C), temperature source Oral, resp. rate 18, SpO2 98 %.  The patient is calm and cooperative at this time.  There have been no acute events since the last update.  Awaiting disposition plan from case management/social work.   Patient has been psychiatrically cleared and awaiting disposition from social work.   Alisse Tuite, Delice Bison, DO 02/28/22 [phone]

## 2022-02-28 NOTE — ED Notes (Addendum)
Breakfast meal tray delivered at this time. Pt ate 100% of meal tray at this time.

## 2022-02-28 NOTE — ED Notes (Signed)
Lunch meal tray delivered at this time. Pt ate 100% of food tray without difficulty

## 2022-02-28 NOTE — ED Notes (Signed)
VOL / pending TOC placement 

## 2022-02-28 NOTE — ED Notes (Signed)
PPatient is vol pending placement

## 2022-03-01 DIAGNOSIS — F03918 Unspecified dementia, unspecified severity, with other behavioral disturbance: Secondary | ICD-10-CM | POA: Diagnosis not present

## 2022-03-01 LAB — CBG MONITORING, ED
Glucose-Capillary: 304 mg/dL — ABNORMAL HIGH (ref 70–99)
Glucose-Capillary: 344 mg/dL — ABNORMAL HIGH (ref 70–99)
Glucose-Capillary: 347 mg/dL — ABNORMAL HIGH (ref 70–99)
Glucose-Capillary: 315 mg/dL — ABNORMAL HIGH (ref 70–99)

## 2022-03-01 MED ORDER — INSULIN GLARGINE-YFGN 100 UNIT/ML ~~LOC~~ SOLN
35.0000 [IU] | Freq: Every day | SUBCUTANEOUS | Status: DC
  Administered 2022-03-01 – 2022-03-10 (×10): 35 [IU] via SUBCUTANEOUS
  Filled 2022-03-01 (×10): qty 0.35

## 2022-03-01 NOTE — ED Notes (Signed)
Pt was given a dinner tray and beverage.

## 2022-03-01 NOTE — ED Notes (Signed)
Pt given lunch tray and drink 

## 2022-03-01 NOTE — ED Notes (Signed)
Pt given breakfast tray and beverage.  

## 2022-03-01 NOTE — ED Provider Notes (Signed)
    02/28/2022    7:55 PM 02/28/2022    5:12 PM 02/28/2022    8:55 AM  Vitals with BMI  Systolic 180 970 449  Diastolic 75 80 89  Pulse 59 66 90    Patient resting. No changes in the last 24 hours. He is awaiting SW disposition.    Rada Hay, MD 03/01/22 [phone]

## 2022-03-01 NOTE — ED Notes (Signed)
Patient was given a sandwhich tray and 2 cups of milk. 100% consumed.

## 2022-03-02 LAB — CBG MONITORING, ED
Glucose-Capillary: 298 mg/dL — ABNORMAL HIGH (ref 70–99)
Glucose-Capillary: 249 mg/dL — ABNORMAL HIGH (ref 70–99)
Glucose-Capillary: 323 mg/dL — ABNORMAL HIGH (ref 70–99)
Glucose-Capillary: 228 mg/dL — ABNORMAL HIGH (ref 70–99)

## 2022-03-02 NOTE — Progress Notes (Signed)
Inpatient Diabetes Program Recommendations  AACE/ADA: New Consensus Statement on Inpatient Glycemic Control (2015)  Target Ranges:  Prepandial:   less than 140 mg/dL      Peak postprandial:   less than 180 mg/dL (1-2 hours)      Critically ill patients:  140 - 180 mg/dL   Lab Results  Component Value Date   GLUCAP 298 (H) 03/02/2022   HGBA1C 10.1 (H) 12/04/2021    Review of Glycemic Control  Latest Reference Range & Units 03/01/22 16:55 03/01/22 21:57 03/02/22 09:05  Glucose-Capillary 70 - 99 mg/dL 347 (H) 315 (H) 298 (H)   Diabetes history: DM 2 Outpatient Diabetes medications:  Lantus 35 units q PM, Humalog 15 units tid with meals Current orders for Inpatient glycemic control:  Novolog 0-15 units tid with meals and HS Semglee 35 units daily Inpatient Diabetes Program Recommendations:    Consider adding Novolog 4 units tid with meals (hold if patient eats less than 50% or NPO).   Thanks, Adah Perl, RN, BC-ADM Inpatient Diabetes Coordinator Pager [phone]  (8a-5p)

## 2022-03-02 NOTE — ED Notes (Signed)
Lunch tray given to pt.

## 2022-03-02 NOTE — TOC Progression Note (Signed)
Transition of Care (TOC) - Progression Note    Patient Details  Name: Scott Lawson. MRN: [id number] Date of Birth: Sep 14, 1940  Transition of Care [facility]) CM/SW Contact  Shelbie Hutching, RN Phone Number: 03/02/2022, 4:21 PM  Clinical Narrative:    Spoke with Steffanie Dunn with [place] DSS, she is trying to get patient into [facility], she request an updated FL2.  FL2 will be sent to [facility] and secure emailed to [place].     Expected Discharge Plan: Memory Care Barriers to Discharge: ED Unsafe disposition  Expected Discharge Plan and Services Expected Discharge Plan: Memory Care   Discharge Planning Services: CM Consult   Living arrangements for the past 2 months: Single Family Home                 DME Arranged: N/A DME Agency: NA       HH Arranged: NA HH Agency: NA         Social Determinants of Health (SDOH) Interventions    Readmission Risk Interventions     No data to display

## 2022-03-02 NOTE — ED Notes (Signed)
Dinner tray and beverage provided 

## 2022-03-02 NOTE — ED Notes (Addendum)
Hospital meal provided, pt tolerated w/o complaints.  Waste discarded appropriately.  

## 2022-03-02 NOTE — ED Provider Notes (Signed)
-----------------------------------------   4:44 AM on 03/02/2022 -----------------------------------------   Blood pressure 136/83, pulse 77, temperature 97.9 F (36.6 C), resp. rate 17, SpO2 96 %.  The patient is calm and cooperative at this time.  There have been no acute events since the last update.  Awaiting disposition plan from case management/social work.    Theora Vankirk, Delice Bison, DO 03/02/22 [phone]

## 2022-03-02 NOTE — ED Notes (Signed)
VOL/Pending TOC Placement 

## 2022-03-03 DIAGNOSIS — F03918 Unspecified dementia, unspecified severity, with other behavioral disturbance: Secondary | ICD-10-CM | POA: Diagnosis not present

## 2022-03-03 LAB — CBG MONITORING, ED
Glucose-Capillary: 165 mg/dL — ABNORMAL HIGH (ref 70–99)
Glucose-Capillary: 213 mg/dL — ABNORMAL HIGH (ref 70–99)
Glucose-Capillary: 273 mg/dL — ABNORMAL HIGH (ref 70–99)
Glucose-Capillary: 342 mg/dL — ABNORMAL HIGH (ref 70–99)

## 2022-03-03 NOTE — Inpatient Diabetes Management (Signed)
Inpatient Diabetes Program Recommendations  AACE/ADA: New Consensus Statement on Inpatient Glycemic Control (2015)  Target Ranges:  Prepandial:   less than 140 mg/dL      Peak postprandial:   less than 180 mg/dL (1-2 hours)      Critically ill patients:  140 - 180 mg/dL   Lab Results  Component Value Date   GLUCAP 165 (H) 03/03/2022   HGBA1C 10.1 (H) 12/04/2021    Review of Glycemic Control  Latest Reference Range & Units 03/02/22 09:05 03/02/22 12:07 03/02/22 16:25 03/02/22 21:16 03/03/22 09:27  Glucose-Capillary 70 - 99 mg/dL 298 (H) 249 (H) 323 (H) 228 (H) 165 (H)  (H): Data is abnormally high  Diabetes history: DM 2 Outpatient Diabetes medications:  Lantus 35 units q PM, Humalog 15 units tid with meals Current orders for Inpatient glycemic control:  Novolog 0-15 units tid with meals and HS Semglee 35 units daily  Inpatient Diabetes Program Recommendations:     Consider adding Novolog 4 units tid with meals (hold if patient eats less than 50% or NPO).   Thank you, Nani Gasser. Ocie Stanzione, RN, MSN, CDE  Diabetes Coordinator Inpatient Glycemic Control Team Team Pager [phone] (8am-5pm) 03/03/2022 11:27 AM

## 2022-03-03 NOTE — ED Notes (Signed)
Hospital meal provided.  100% consumed, pt tolerated w/o complaints.  Waste discarded appropriately.   

## 2022-03-03 NOTE — ED Notes (Signed)
Lunch given.

## 2022-03-03 NOTE — ED Notes (Signed)
Vol /pending placement 

## 2022-03-03 NOTE — ED Provider Notes (Signed)
-----------------------------------------   5:37 AM on 03/03/2022 -----------------------------------------   Blood pressure (!) 120/90, pulse 66, temperature 97.8 F (36.6 C), temperature source Oral, resp. rate 18, SpO2 95 %.  The patient is calm and cooperative at this time.  There have been no acute events since the last update.  Awaiting disposition plan from Social Work team.   Paulette Blanch, MD 03/03/22 [phone]

## 2022-03-04 LAB — CBG MONITORING, ED
Glucose-Capillary: 230 mg/dL — ABNORMAL HIGH (ref 70–99)
Glucose-Capillary: 320 mg/dL — ABNORMAL HIGH (ref 70–99)
Glucose-Capillary: 259 mg/dL — ABNORMAL HIGH (ref 70–99)
Glucose-Capillary: 390 mg/dL — ABNORMAL HIGH (ref 70–99)

## 2022-03-04 NOTE — Inpatient Diabetes Management (Signed)
Inpatient Diabetes Program Recommendations  AACE/ADA: New Consensus Statement on Inpatient Glycemic Control (2015)  Target Ranges:  Prepandial:   less than 140 mg/dL      Peak postprandial:   less than 180 mg/dL (1-2 hours)      Critically ill patients:  140 - 180 mg/dL   Lab Results  Component Value Date   GLUCAP 320 (H) 03/04/2022   HGBA1C 10.1 (H) 12/04/2021    Latest Reference Range & Units 03/03/22 09:27 03/03/22 11:55 03/03/22 17:09 03/03/22 21:20 03/04/22 08:57 03/04/22 11:54  Glucose-Capillary 70 - 99 mg/dL 165 (H) 213 (H) 273 (H) 342 (H) 230 (H) 320 (H)  (H): Data is abnormally high  Review of Glycemic Control  Diabetes history: DM 2 Outpatient Diabetes medications:  Lantus 35 units q PM, Humalog 15 units tid with meals Current orders for Inpatient glycemic control:  Novolog 0-15 units tid with meals and HS Semglee 35 units daily   Inpatient Diabetes Program Recommendations:    Post prandial CBGs continue to be elevated. Spoke with RN Verlene Mayer and she is communicating to MD on unit.  Consider adding Novolog 4 units tid with meals (hold if patient eats less than 50% or NPO).   Thank you, Nani Gasser. Ahmar Pickrell, RN, MSN, CDE  Diabetes Coordinator Inpatient Glycemic Control Team Team Pager [phone] (8am-5pm) 03/04/2022 12:55 PM

## 2022-03-04 NOTE — ED Notes (Signed)
VOL/pending placement 

## 2022-03-04 NOTE — ED Notes (Signed)
Pt given dinner tray and drink at this time. 

## 2022-03-04 NOTE — ED Notes (Signed)
Pt given snack tray and drink. Currently in restroom.

## 2022-03-04 NOTE — ED Notes (Signed)
Report received from [place], Conservation officer, nature. Patient alert and oriented, warm and dry, and in no acute distress. Patient denies SI, HI, AVH and pain. Pt is adamant that he is leaving ASAP and demanding staff to give him belongings and let him leave. Patient made aware of Q15 minute rounds and Engineer, drilling presence for their safety. Patient instructed to come to this nurse with needs or concerns.

## 2022-03-04 NOTE — NC FL2 (Signed)
[place] LEVEL OF CARE SCREENING TOOL     IDENTIFICATION  Patient Name: Scott Lawson. Birthdate: Jan 17, 1941 Sex: male Admission Date (Current Location): 02/25/2022  [place] and [place] Number:  Engineering geologist and Address:  [facility], [address], [place], [place] [address]      Provider Number: [id number]  Attending Physician Name and Address:  No att. providers found  Relative Name and Phone Number:  Steffanie Dunn [phone] APS    Current Level of Care: Other (Comment) (ED boarder for placement) Recommended Level of Care: Memory Care Prior Approval Number:    Date Approved/Denied:   PASRR Number:    Discharge Plan: Other (Comment) (MemoryCare)    Current Diagnoses: Patient Active Problem List   Diagnosis Date Noted   Dementia with behavioral disturbance ([place]) 12/04/2021   Aggressive behavior 10/23/2021   Chronic diastolic CHF (congestive heart failure) ([place]) 02/04/2021   Emphysema lung ([place]) 10/26/2020   History of 2019 novel coronavirus disease (COVID-19) 10/26/2020   Interstitial lung disease ([place]) 08/24/2020   Chronic respiratory failure with hypoxia ([place]) 06/20/2020   Atherosclerosis of aorta ([place]) 10/12/2019   Long-term insulin use ([place]) 10/12/2019   B12 deficiency 06/28/2018   Prostate cancer ([place]) 06/27/2018   Psoriasis 08/16/2017   Advanced care planning/counseling discussion 12/01/2016   GERD without esophagitis 08/14/2015   Type 2 diabetes mellitus with diabetic chronic kidney disease ([place]) 08/13/2015   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[place]) 08/13/2015   Chronic kidney disease, stage 3a ([place]) 02/11/2015   Mixed hyperlipidemia due to type 2 diabetes mellitus ([place]) 02/11/2015   BPH (benign prostatic hyperplasia) 02/11/2015    Orientation RESPIRATION BLADDER Height & Weight     Self, Place  Normal Continent Weight:   Height:     BEHAVIORAL  SYMPTOMS/MOOD NEUROLOGICAL BOWEL NUTRITION STATUS      Continent Diet (Regular)  AMBULATORY STATUS COMMUNICATION OF NEEDS Skin   Independent Verbally Normal                       Personal Care Assistance Level of Assistance  Bathing, Feeding, Dressing Bathing Assistance: Limited assistance Feeding assistance: Independent Dressing Assistance: Independent     Functional Limitations Info  Sight, Hearing, Speech Sight Info: Adequate Hearing Info: Adequate Speech Info: Adequate    SPECIAL CARE FACTORS FREQUENCY                       Contractures Contractures Info: Not present    Additional Factors Info  Code Status, Allergies Code Status Info: Full Allergies Info: Gabapentin           Current Medications (03/04/2022):  This is the current hospital active medication list Current Facility-Administered Medications  Medication Dose Route Frequency Provider Last Rate Last Admin   aspirin EC tablet 81 mg  81 mg Oral Daily Blake Divine, MD   81 mg at 03/04/22 0950   atorvastatin (LIPITOR) tablet 80 mg  80 mg Oral Daily Blake Divine, MD   80 mg at 03/04/22 0950   cyanocobalamin ((VITAMIN B-12)) injection 1,000 mcg  1,000 mcg Intramuscular Q30 days Johnson, Megan P, DO       donepezil (ARICEPT) tablet 10 mg  10 mg Oral QHS Blake Divine, MD   10 mg at 03/03/22 2133   insulin aspart (novoLOG) injection 0-15 Units  0-15 Units Subcutaneous TID WC Blake Divine, MD   11 Units  at 03/04/22 1242   insulin aspart (novoLOG) injection 0-5 Units  0-5 Units Subcutaneous QHS Blake Divine, MD   4 Units at 03/03/22 2136   insulin glargine-yfgn [facility]) injection 35 Units  35 Units Subcutaneous Daily Merlyn Lot, MD   35 Units at 03/04/22 0950   QUEtiapine (SEROQUEL) tablet 25 mg  25 mg Oral Thurnell Lose, MD   25 mg at 03/03/22 2133   Current Outpatient Medications  Medication Sig Dispense Refill   aspirin EC 81 MG tablet Take 1 tablet (81 mg total) by mouth  daily. Swallow whole. 150 tablet 2   atorvastatin (LIPITOR) 80 MG tablet Take 1 tablet (80 mg total) by mouth daily. 90 tablet 1   donepezil (ARICEPT) 10 MG tablet Take 1 tablet (10 mg total) by mouth at bedtime. 30 tablet 2   HUMALOG 100 UNIT/ML injection Inject 15 Units into the skin 3 (three) times daily.     insulin glargine (LANTUS) 100 UNIT/ML injection Inject 0.35 mLs (35 Units total) into the skin every evening. 1800 10 mL 11   QUEtiapine (SEROQUEL) 25 MG tablet Take 1 tablet (25 mg total) by mouth at bedtime. 30 tablet 2   Blood Glucose Monitoring Suppl (ONETOUCH VERIO) w/Device KIT Use to check blood sugar 3 times a day and document results, bring to appointments.  Goal is <130 fasting blood sugar and <180 two hours after meals. 1 kit 0   glucose blood (ONETOUCH VERIO) test strip Use to check blood sugar 3 times a day and document results, bring to appointments.  Goal is <130 fasting blood sugar and <180 two hours after meals. 100 each 12   Lancets (ONETOUCH ULTRASOFT) lancets Use to check blood sugar 3 times a day and document results, bring to appointments.  Goal is <130 fasting blood sugar and <180 two hours after meals. 100 each 12     Discharge Medications: Please see discharge summary for a list of discharge medications.  Relevant Imaging Results:  Relevant Lab Results:   Additional Information ELT:[id number]  Shelbie Hutching, RN

## 2022-03-04 NOTE — ED Notes (Signed)
VOL/Pending Placement 

## 2022-03-05 DIAGNOSIS — F03918 Unspecified dementia, unspecified severity, with other behavioral disturbance: Secondary | ICD-10-CM | POA: Diagnosis not present

## 2022-03-05 LAB — CBG MONITORING, ED
Glucose-Capillary: 232 mg/dL — ABNORMAL HIGH (ref 70–99)
Glucose-Capillary: 328 mg/dL — ABNORMAL HIGH (ref 70–99)
Glucose-Capillary: 223 mg/dL — ABNORMAL HIGH (ref 70–99)
Glucose-Capillary: 233 mg/dL — ABNORMAL HIGH (ref 70–99)

## 2022-03-05 NOTE — ED Provider Notes (Signed)
-----------------------------------------   6:11 AM on 03/05/2022 -----------------------------------------   Blood pressure 139/73, pulse 93, temperature 98.2 F (36.8 C), temperature source Oral, resp. rate 18, SpO2 95 %.  The patient is calm and cooperative at this time.  There have been no acute events since the last update.  Awaiting disposition plan from [facility] team.   Hinda Kehr, MD 03/05/22 [phone]

## 2022-03-05 NOTE — ED Notes (Signed)
VOL/pending placement 

## 2022-03-05 NOTE — Progress Notes (Signed)
Inpatient Diabetes Program Recommendations  AACE/ADA: New Consensus Statement on Inpatient Glycemic Control (2015)  Target Ranges:  Prepandial:   less than 140 mg/dL      Peak postprandial:   less than 180 mg/dL (1-2 hours)      Critically ill patients:  140 - 180 mg/dL   Lab Results  Component Value Date   GLUCAP 328 (H) 03/05/2022   HGBA1C 10.1 (H) 12/04/2021    Review of Glycemic Control  Latest Reference Range & Units 03/04/22 08:57 03/04/22 11:54 03/04/22 17:09 03/04/22 21:31 03/05/22 07:37 03/05/22 11:36  Glucose-Capillary 70 - 99 mg/dL 230 (H) 320 (H) 259 (H) 390 (H) 232 (H) 328 (H)   Diabetes history: DM 2 Outpatient Diabetes medications:  Humalog 15 units tid with meals Lantus 35 units q HS Current orders for Inpatient glycemic control:  Novolog 0-15 units tid with meals and HS Semglee 35 units daily Inpatient Diabetes Program Recommendations:    May consider adding Novolog 4 units tid with meals (hold if patient eats less than 50% or NPO).   Thanks,  Adah Perl, RN, BC-ADM Inpatient Diabetes Coordinator Pager [phone]  (78a-5p0

## 2022-03-05 NOTE — ED Notes (Signed)
Pt received dinner tray.

## 2022-03-05 NOTE — ED Notes (Signed)
Hospital meal provided, pt tolerated w/o complaints.  Waste discarded appropriately.  

## 2022-03-06 DIAGNOSIS — F03918 Unspecified dementia, unspecified severity, with other behavioral disturbance: Secondary | ICD-10-CM | POA: Diagnosis not present

## 2022-03-06 LAB — CBG MONITORING, ED
Glucose-Capillary: 158 mg/dL — ABNORMAL HIGH (ref 70–99)
Glucose-Capillary: 341 mg/dL — ABNORMAL HIGH (ref 70–99)
Glucose-Capillary: 205 mg/dL — ABNORMAL HIGH (ref 70–99)

## 2022-03-06 NOTE — ED Notes (Signed)
Pt insisted he did not get dinner. Pt requested something else to eat, so this tech offered a meal tray with a sandwich, potato chips, and graham crackers. Pt accepted the meal tray and said thank you.

## 2022-03-06 NOTE — ED Provider Notes (Signed)
Emergency Medicine Observation Re-evaluation Note  Scott Lawson. is a 81 y.o. male, seen on rounds today.  Pt initially presented to the ED for complaints of IVC Currently, the patient is resting comfortably.  Physical Exam  BP 120/73 (BP Location: Left Arm)   Pulse 95   Temp 98.3 F (36.8 C) (Oral)   Resp 18   SpO2 94%  Physical Exam General: No acute distress Cardiac: Well-perfused extremities Lungs: No respiratory distress Psych: Appropriate mood and affect  ED Course / MDM  EKG:   I have reviewed the labs performed to date as well as medications administered while in observation.  Recent changes in the last 24 hours include none.  Plan  Current plan is for placement.    Naaman Plummer, MD 03/06/22 1535

## 2022-03-06 NOTE — ED Notes (Signed)
Pt requested chocolate ice-cream. This tech provided pt ice-cream and he said thank you.

## 2022-03-06 NOTE — ED Notes (Signed)
CBG not transmitting, result was 314.

## 2022-03-06 NOTE — ED Notes (Signed)
Patient given phone to speak to legal guardian. Patient is upset he is "still here against his will". Patient able to be calmed down quickly. States his legal guardian told him she will be coming to see him today around noon.

## 2022-03-06 NOTE — ED Notes (Signed)
VOL/pending placement 

## 2022-03-06 NOTE — ED Notes (Signed)
Pt given supplies to shower. Pt showering at this time.

## 2022-03-06 NOTE — ED Notes (Signed)
Breakfast tray given to pt 

## 2022-03-07 DIAGNOSIS — J9601 Acute respiratory failure with hypoxia: Secondary | ICD-10-CM | POA: Diagnosis not present

## 2022-03-07 DIAGNOSIS — F03918 Unspecified dementia, unspecified severity, with other behavioral disturbance: Secondary | ICD-10-CM | POA: Diagnosis not present

## 2022-03-07 DIAGNOSIS — U071 COVID-19: Secondary | ICD-10-CM | POA: Diagnosis not present

## 2022-03-07 LAB — CBG MONITORING, ED
Glucose-Capillary: 354 mg/dL — ABNORMAL HIGH (ref 70–99)
Glucose-Capillary: 314 mg/dL — ABNORMAL HIGH (ref 70–99)
Glucose-Capillary: 188 mg/dL — ABNORMAL HIGH (ref 70–99)
Glucose-Capillary: 330 mg/dL — ABNORMAL HIGH (ref 70–99)
Glucose-Capillary: 353 mg/dL — ABNORMAL HIGH (ref 70–99)
Glucose-Capillary: 264 mg/dL — ABNORMAL HIGH (ref 70–99)

## 2022-03-07 NOTE — ED Provider Notes (Signed)
-----------------------------------------   6:22 AM on 03/07/2022 -----------------------------------------   Blood pressure 118/75, pulse 78, temperature 98 F (36.7 C), temperature source Oral, resp. rate 18, SpO2 95 %.  The patient is calm and cooperative at this time.  There have been no acute events since the last update.  Awaiting disposition plan from [facility] team.   Hinda Kehr, MD 03/07/22 [phone]

## 2022-03-07 NOTE — ED Notes (Signed)
VOL/pending placement 

## 2022-03-08 DIAGNOSIS — Z20822 Contact with and (suspected) exposure to covid-19: Secondary | ICD-10-CM | POA: Diagnosis not present

## 2022-03-08 DIAGNOSIS — N189 Chronic kidney disease, unspecified: Secondary | ICD-10-CM | POA: Diagnosis not present

## 2022-03-08 DIAGNOSIS — E1122 Type 2 diabetes mellitus with diabetic chronic kidney disease: Secondary | ICD-10-CM | POA: Diagnosis not present

## 2022-03-08 DIAGNOSIS — I130 Hypertensive heart and chronic kidney disease with heart failure and stage 1 through stage 4 chronic kidney disease, or unspecified chronic kidney disease: Secondary | ICD-10-CM | POA: Diagnosis not present

## 2022-03-08 DIAGNOSIS — F03918 Unspecified dementia, unspecified severity, with other behavioral disturbance: Secondary | ICD-10-CM | POA: Diagnosis not present

## 2022-03-08 DIAGNOSIS — I509 Heart failure, unspecified: Secondary | ICD-10-CM | POA: Diagnosis not present

## 2022-03-08 DIAGNOSIS — Z79899 Other long term (current) drug therapy: Secondary | ICD-10-CM | POA: Diagnosis not present

## 2022-03-08 LAB — CBG MONITORING, ED
Glucose-Capillary: 207 mg/dL — ABNORMAL HIGH (ref 70–99)
Glucose-Capillary: 392 mg/dL — ABNORMAL HIGH (ref 70–99)
Glucose-Capillary: 209 mg/dL — ABNORMAL HIGH (ref 70–99)

## 2022-03-08 NOTE — ED Provider Notes (Signed)
Emergency Medicine Observation Re-evaluation Note  Scott Lawson. is a 81 y.o. male, being seen in the emergency department for psychiatric/geriatric complaint.  Physical Exam  BP 118/75 (BP Location: Right Arm)   Pulse 78   Temp 98 F (36.7 C) (Oral)   Resp 18   SpO2 95%    ED Course / MDM   No recent lab work besides CBGs.  Plan  Current plan is for placement to an appropriate living facility once available.    Harvest Dark, MD 03/08/22 [phone]

## 2022-03-08 NOTE — ED Notes (Signed)
Pt alert, c/c; pt given diet gingerale as requested.

## 2022-03-08 NOTE — ED Notes (Signed)
Vol /pending placement 

## 2022-03-08 NOTE — ED Notes (Signed)
Pt briefly talked with son-in-law Doyne Keel over hospital phone. [phone].

## 2022-03-08 NOTE — ED Notes (Signed)
Pt to bathroom and back to his room.

## 2022-03-08 NOTE — ED Notes (Signed)
Lou-Ann at bedside as pt requested to talk with an advocate. Pt appreciative.

## 2022-03-08 NOTE — ED Notes (Signed)
Pt given breakfast tray. Pt still sleeping at this time.

## 2022-03-08 NOTE — ED Notes (Addendum)
This note made in error.

## 2022-03-08 NOTE — ED Notes (Signed)
VOL/pending placement 

## 2022-03-09 DIAGNOSIS — Z79899 Other long term (current) drug therapy: Secondary | ICD-10-CM | POA: Diagnosis not present

## 2022-03-09 DIAGNOSIS — I509 Heart failure, unspecified: Secondary | ICD-10-CM | POA: Diagnosis not present

## 2022-03-09 DIAGNOSIS — F03918 Unspecified dementia, unspecified severity, with other behavioral disturbance: Secondary | ICD-10-CM | POA: Diagnosis not present

## 2022-03-09 DIAGNOSIS — E1122 Type 2 diabetes mellitus with diabetic chronic kidney disease: Secondary | ICD-10-CM | POA: Diagnosis not present

## 2022-03-09 DIAGNOSIS — Z20822 Contact with and (suspected) exposure to covid-19: Secondary | ICD-10-CM | POA: Diagnosis not present

## 2022-03-09 DIAGNOSIS — N189 Chronic kidney disease, unspecified: Secondary | ICD-10-CM | POA: Diagnosis not present

## 2022-03-09 DIAGNOSIS — I130 Hypertensive heart and chronic kidney disease with heart failure and stage 1 through stage 4 chronic kidney disease, or unspecified chronic kidney disease: Secondary | ICD-10-CM | POA: Diagnosis not present

## 2022-03-09 LAB — CBG MONITORING, ED
Glucose-Capillary: 96 mg/dL (ref 70–99)
Glucose-Capillary: 280 mg/dL — ABNORMAL HIGH (ref 70–99)
Glucose-Capillary: 279 mg/dL — ABNORMAL HIGH (ref 70–99)
Glucose-Capillary: 284 mg/dL — ABNORMAL HIGH (ref 70–99)

## 2022-03-09 MED ORDER — HYDROXYZINE HCL 25 MG PO TABS
50.0000 mg | ORAL_TABLET | Freq: Once | ORAL | Status: AC
  Administered 2022-03-09: 50 mg via ORAL
  Filled 2022-03-09: qty 2

## 2022-03-09 NOTE — ED Notes (Signed)
Pt given lunch tray and drink at this time. 

## 2022-03-09 NOTE — ED Notes (Signed)
This tech brought pt his dinner consisting of a cheeseburger, potato chips and cookies. This tech also brought pt a fresh cup of ice water. Pt accepted dinner tray and said "thank you." Pt also explained how he believes he should be going home.

## 2022-03-09 NOTE — ED Notes (Signed)
Pt stated he is supposed to go home today and asking when he will be leaving. Advised pt we do not have information on discharge but will let him know when I have more information. He was appreciative. Graham crackers and juice provided to pt.

## 2022-03-09 NOTE — TOC Progression Note (Signed)
Transition of Care (TOC) - Progression Note    Patient Details  Name: Scott Lawson. MRN: [id number] Date of Birth: Feb 18, 1941  Transition [facility] Care [facility]) CM/SW [place], [place] Phone Number: 03/09/2022, 3:49 PM  Clinical Narrative:     CSW lvm with DSS Steffanie Dunn for any updates on placement at [phone].  Expected Discharge Plan: Memory Care Barriers to Discharge: ED Unsafe disposition  Expected Discharge Plan and Services Expected Discharge Plan: Memory Care   Discharge Planning Services: CM Consult   Living arrangements for the past 2 months: Single Family Home                 DME Arranged: N/A DME Agency: NA       HH Arranged: NA HH Agency: NA         Social Determinants of Health (SDOH) Interventions    Readmission Risk Interventions     No data to display

## 2022-03-09 NOTE — ED Notes (Signed)
Pt awake and ambulating to bathroom without difficulty.

## 2022-03-09 NOTE — ED Provider Notes (Signed)
-----------------------------------------   5:24 AM on 03/09/2022 -----------------------------------------   Blood pressure 102/65, pulse (!) 108, temperature 97.8 F (36.6 C), temperature source Oral, resp. rate 20, SpO2 93 %.  The patient is calm and cooperative at this time.  There have been no acute events since the last update.  Awaiting disposition plan from Social Work team.   Paulette Blanch, MD 03/09/22 [phone]

## 2022-03-09 NOTE — ED Notes (Signed)
Pt standing in doorway asking when he is leaving. Pt states his guardian is coming to get him.

## 2022-03-09 NOTE — ED Notes (Signed)
Pt is very adamant that he will be leaving tonight and continues to tell this nurse to let him out of here. Pt educated again on plan but denies this and repeatedly states his legal guardian tells him he should not be here nor should have come here. Pt is convinced of this. Will continue to divert pt thinking at this time as he has concrete thinking in situation.

## 2022-03-09 NOTE — ED Notes (Signed)
Report received from [place], Conservation officer, nature. Patient alert and oriented, warm and dry, and in no acute distress. Patient denies SI, HI, AVH and pain. Patient made aware of Q15 minute rounds and Engineer, drilling presence for their safety. Patient instructed to come to this nurse with needs or concerns.

## 2022-03-10 DIAGNOSIS — E1122 Type 2 diabetes mellitus with diabetic chronic kidney disease: Secondary | ICD-10-CM | POA: Diagnosis not present

## 2022-03-10 DIAGNOSIS — Z743 Need for continuous supervision: Secondary | ICD-10-CM | POA: Diagnosis not present

## 2022-03-10 DIAGNOSIS — I509 Heart failure, unspecified: Secondary | ICD-10-CM | POA: Diagnosis not present

## 2022-03-10 DIAGNOSIS — Z7401 Bed confinement status: Secondary | ICD-10-CM | POA: Diagnosis not present

## 2022-03-10 DIAGNOSIS — Z20822 Contact with and (suspected) exposure to covid-19: Secondary | ICD-10-CM | POA: Diagnosis not present

## 2022-03-10 DIAGNOSIS — I130 Hypertensive heart and chronic kidney disease with heart failure and stage 1 through stage 4 chronic kidney disease, or unspecified chronic kidney disease: Secondary | ICD-10-CM | POA: Diagnosis not present

## 2022-03-10 DIAGNOSIS — Z79899 Other long term (current) drug therapy: Secondary | ICD-10-CM | POA: Diagnosis not present

## 2022-03-10 DIAGNOSIS — F03918 Unspecified dementia, unspecified severity, with other behavioral disturbance: Secondary | ICD-10-CM | POA: Diagnosis not present

## 2022-03-10 DIAGNOSIS — N189 Chronic kidney disease, unspecified: Secondary | ICD-10-CM | POA: Diagnosis not present

## 2022-03-10 DIAGNOSIS — R531 Weakness: Secondary | ICD-10-CM | POA: Diagnosis not present

## 2022-03-10 LAB — CBG MONITORING, ED
Glucose-Capillary: 233 mg/dL — ABNORMAL HIGH (ref 70–99)
Glucose-Capillary: 239 mg/dL — ABNORMAL HIGH (ref 70–99)

## 2022-03-10 NOTE — ED Provider Notes (Signed)
-----------------------------------------   5:49 AM on 03/10/2022 -----------------------------------------   Blood pressure 138/71, pulse 73, temperature 98.4 F (36.9 C), resp. rate 20, SpO2 95 %.  The patient is calm and cooperative at this time.  There have been no acute events since the last update.  Awaiting disposition plan from Social Work team.   Paulette Blanch, MD 03/10/22 [phone]

## 2022-03-10 NOTE — ED Notes (Signed)
This RN attmempted to contact [place] home with no answer at this time. Report was called earlier, unable to notify of patient being on the way at this time.

## 2022-03-10 NOTE — TOC Transition Note (Signed)
Transition of Care [facility]) - CM/SW Discharge Note   Patient Details  Name: Scott Lawson. MRN: [id number] Date of Birth: 1940-09-08  Transition of Care [facility]) CM/SW Contact:  Alberteen Sam, LCSW Phone Number: 03/10/2022, 2:08 PM   Clinical Narrative:     Patient will DC to: [facility] Anticipated DC date: 03/10/22 Family notified:  [name], legal guardian Transport by: [facility]  Per MD patient ready for DC to  [facility] [place]. RN, patient, patient's family, and facility notified of DC. Discharge Summary sent to facility. RN given number for report  [phone] Room 614 A. Ambulance transport requested for patient.    Pricilla Riffle, LCSW   Final next level of care: Skilled Nursing Facility Barriers to Discharge: No Barriers Identified   Patient Goals and CMS Choice Patient states their goals for this hospitalization and ongoing recovery are:: to go home CMS Medicare.gov Compare Post Acute Care list provided to:: Patient Choice offered to / list presented to : [place] / Guardian  Discharge Placement              Patient [facility] bed at: [facility]     Patient and family notified of of transfer: 03/10/22  Discharge Plan and Services   Discharge Planning Services: CM Consult            DME Arranged: N/A DME Agency: NA       HH Arranged: NA HH Agency: NA        Social Determinants of Health ([place]) Interventions     Readmission Risk Interventions     No data to display

## 2022-03-10 NOTE — ED Provider Notes (Signed)
I was informed by the social worker that patient has a skilled nursing facility to go today.  I went and assessed the patient and told him that he will be going to SNF he was somewhat upset about not being able to go home.  He otherwise appears comfortable.  I have reviewed the psychiatry consult and he was cleared by psychiatry they did not provide any medication recommendations.  He was continued on his home medications here.  Patient's legal guardian is aware of the plan.  He is appropriate for discharge.   Rada Hay, MD 03/10/22 1534

## 2022-03-10 NOTE — Progress Notes (Signed)
[id number] A PASRR number  Scott Lawson, [place]

## 2022-03-10 NOTE — ED Notes (Signed)
[facility]  CALLED  FOR  TRANSPORT  TO  [facility]  AND  REHAB

## 2022-03-10 NOTE — ED Notes (Signed)
Pt up and awake, provided water. Pt continues with the idea he is leaving today, carried over conversation from the night. Requesting to shave and clean up so he can leave. Unsuccessful orientation. Pt cooperative and states he will be leaving today

## 2022-03-10 NOTE — ED Notes (Signed)
All belongings sent with patient at time of departure.

## 2022-03-11 DIAGNOSIS — M6281 Muscle weakness (generalized): Secondary | ICD-10-CM | POA: Diagnosis not present

## 2022-03-11 DIAGNOSIS — R2681 Unsteadiness on feet: Secondary | ICD-10-CM | POA: Diagnosis not present

## 2022-03-12 DIAGNOSIS — R2681 Unsteadiness on feet: Secondary | ICD-10-CM | POA: Diagnosis not present

## 2022-03-12 DIAGNOSIS — M6281 Muscle weakness (generalized): Secondary | ICD-10-CM | POA: Diagnosis not present

## 2022-03-13 DIAGNOSIS — I10 Essential (primary) hypertension: Secondary | ICD-10-CM | POA: Diagnosis not present

## 2022-03-13 DIAGNOSIS — M6281 Muscle weakness (generalized): Secondary | ICD-10-CM | POA: Diagnosis not present

## 2022-03-13 DIAGNOSIS — R2681 Unsteadiness on feet: Secondary | ICD-10-CM | POA: Diagnosis not present

## 2022-03-13 DIAGNOSIS — E785 Hyperlipidemia, unspecified: Secondary | ICD-10-CM | POA: Diagnosis not present

## 2022-03-13 DIAGNOSIS — I509 Heart failure, unspecified: Secondary | ICD-10-CM | POA: Diagnosis not present

## 2022-03-13 DIAGNOSIS — E1165 Type 2 diabetes mellitus with hyperglycemia: Secondary | ICD-10-CM | POA: Diagnosis not present

## 2022-03-14 DIAGNOSIS — M6281 Muscle weakness (generalized): Secondary | ICD-10-CM | POA: Diagnosis not present

## 2022-03-14 DIAGNOSIS — R2681 Unsteadiness on feet: Secondary | ICD-10-CM | POA: Diagnosis not present

## 2022-03-15 DIAGNOSIS — M6281 Muscle weakness (generalized): Secondary | ICD-10-CM | POA: Diagnosis not present

## 2022-03-15 DIAGNOSIS — R2681 Unsteadiness on feet: Secondary | ICD-10-CM | POA: Diagnosis not present

## 2022-03-16 ENCOUNTER — Other Ambulatory Visit: Payer: Self-pay | Admitting: Family Medicine

## 2022-03-16 DIAGNOSIS — M6281 Muscle weakness (generalized): Secondary | ICD-10-CM | POA: Diagnosis not present

## 2022-03-16 DIAGNOSIS — R2681 Unsteadiness on feet: Secondary | ICD-10-CM | POA: Diagnosis not present

## 2022-03-17 DIAGNOSIS — M6281 Muscle weakness (generalized): Secondary | ICD-10-CM | POA: Diagnosis not present

## 2022-03-17 DIAGNOSIS — R2681 Unsteadiness on feet: Secondary | ICD-10-CM | POA: Diagnosis not present

## 2022-03-17 IMAGING — CT CT ANGIO CHEST
2 of 6 series · 18 of 46 positions shown · IV contrast (APPLIED)
Comparison: 06/24/2020

CLINICAL DATA: Generalized weakness, lightheadedness, increasing
shortness of breath, history of I2RFQ-CG

EXAM:
CT ANGIOGRAPHY CHEST WITH CONTRAST
TECHNIQUE: Multidetector CT imaging of the chest was performed using the
standard protocol during bolus administration of intravenous
contrast. Multiplanar CT image reconstructions and MIPs were
obtained to evaluate the vascular anatomy.
CONTRAST:  100mL OMNIPAQUE IOHEXOL 350 MG/ML SOLN

[Series 6: thins · axial · 0.74mm/px · z∈[-318,-114]mm · 16 of 224 slices shown]
[im 10/224  lung]
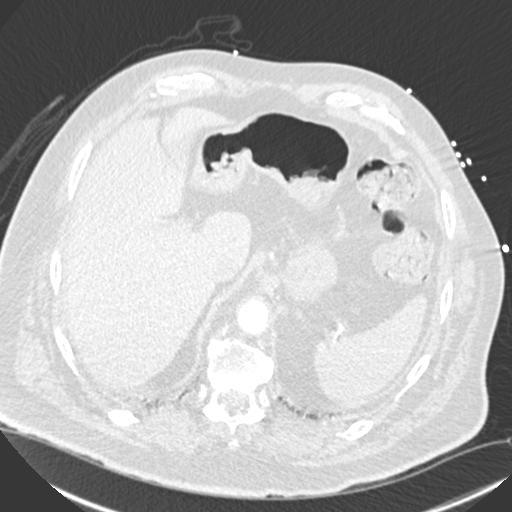
[im 30/224  soft-tissue]
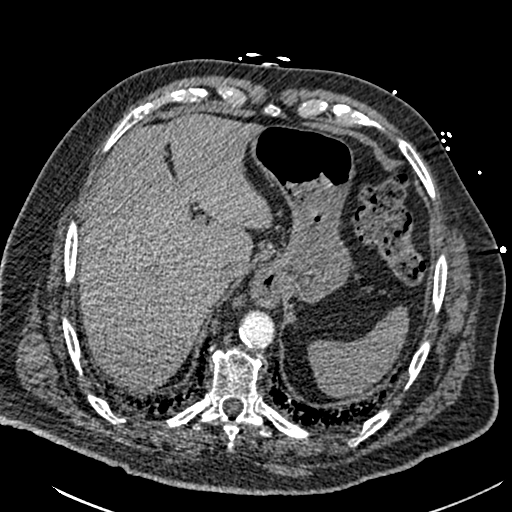
[im 39/224  lung]
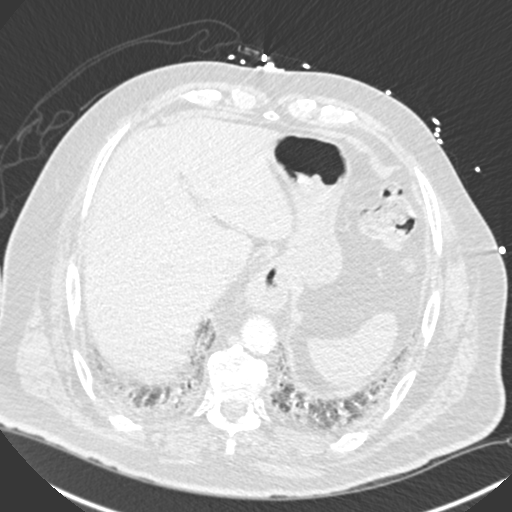
[im 49/224  soft-tissue]
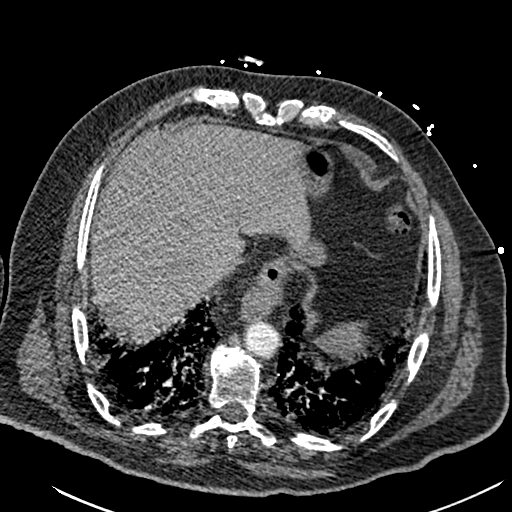
[im 68/224  lung]
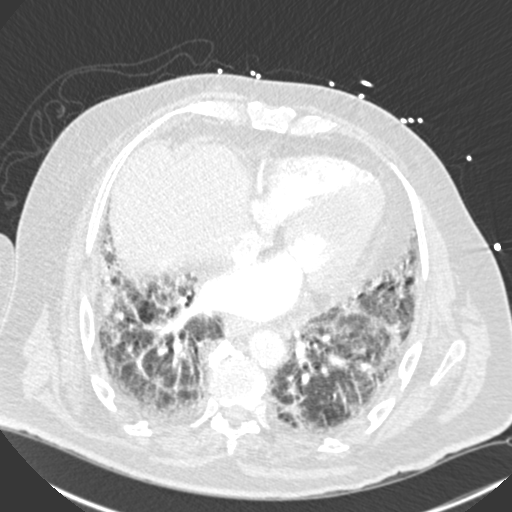
[im 78/224  soft-tissue]
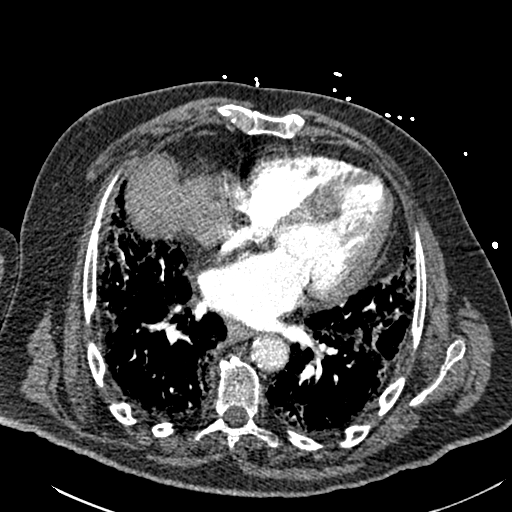
[im 88/224  lung]
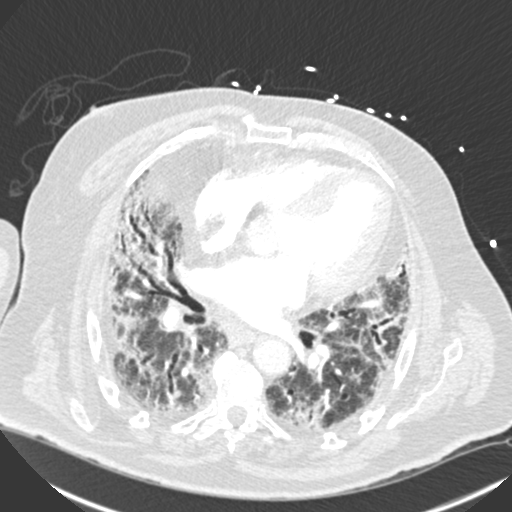
[im 107/224  soft-tissue]
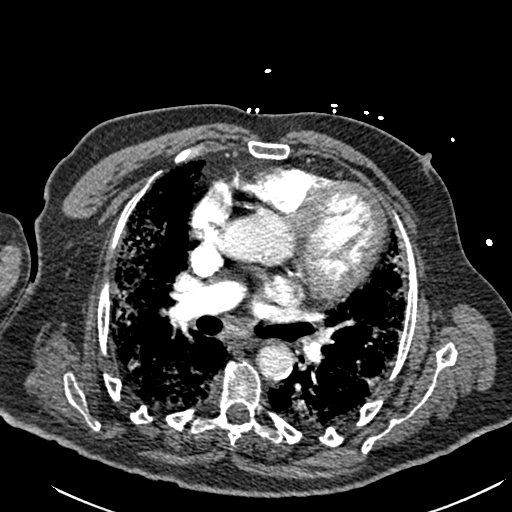
[im 117/224  lung]
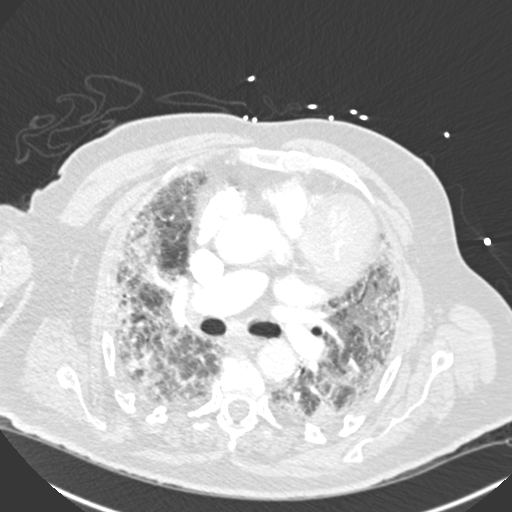
[im 136/224  soft-tissue]
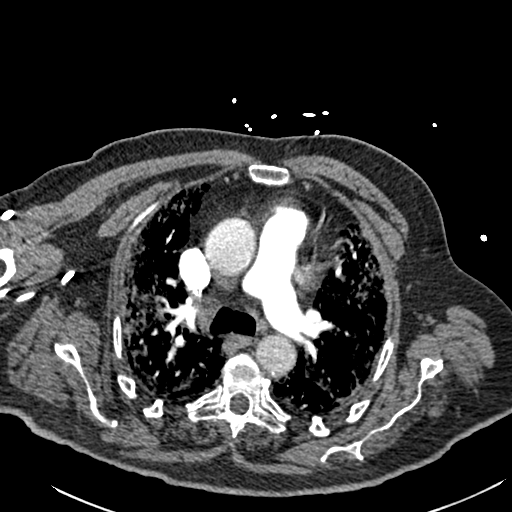
[im 146/224  lung]
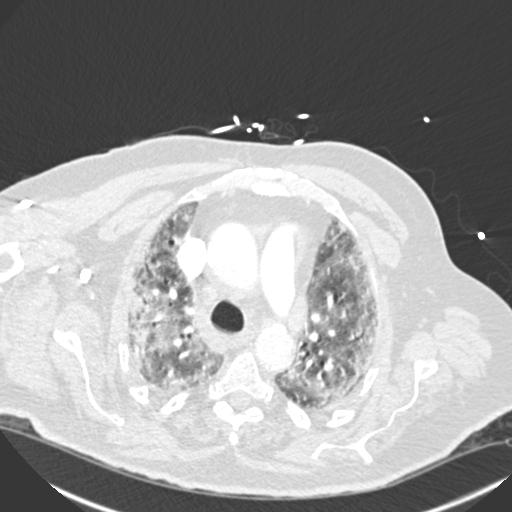
[im 156/224  soft-tissue]
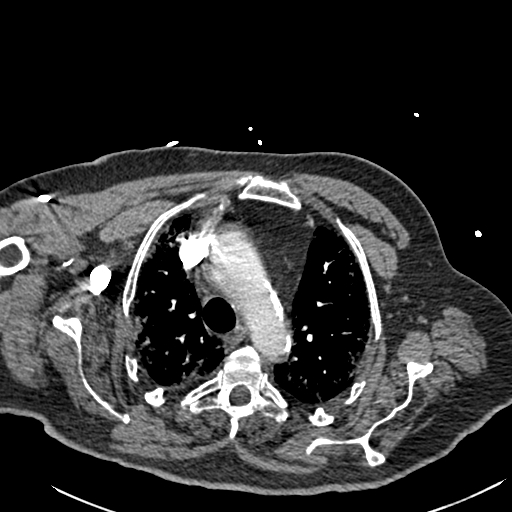
[im 175/224  lung]
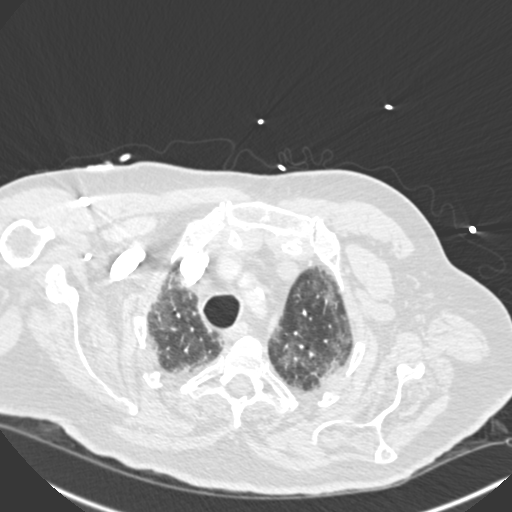
[im 185/224  soft-tissue]
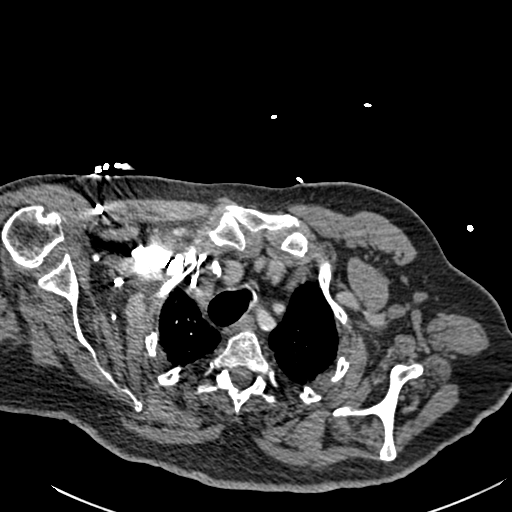
[im 194/224  lung]
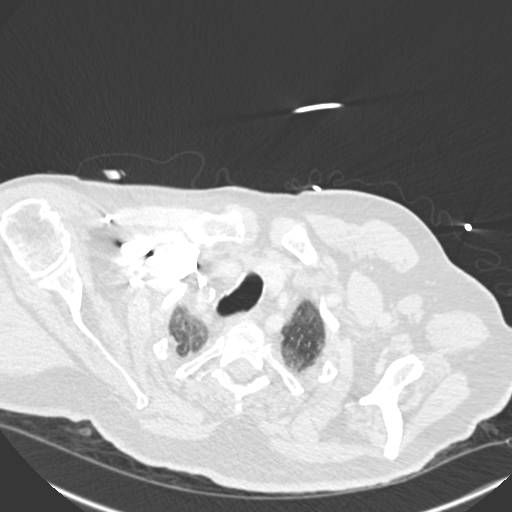
[im 214/224  soft-tissue]
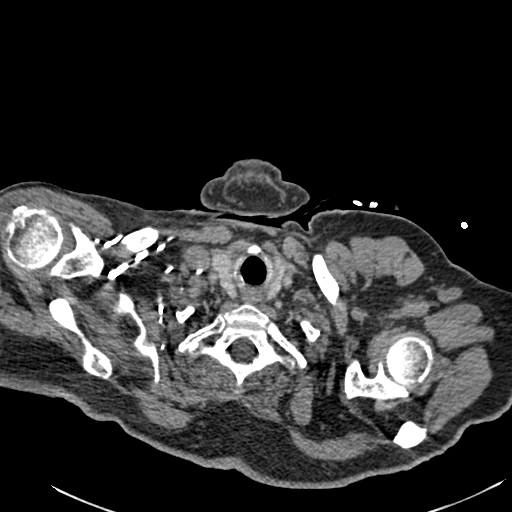

[Series 8: coronal mpr · coronal · 0.49mm/px · 2 of 101 slices shown]
[im 34/101  soft-tissue]
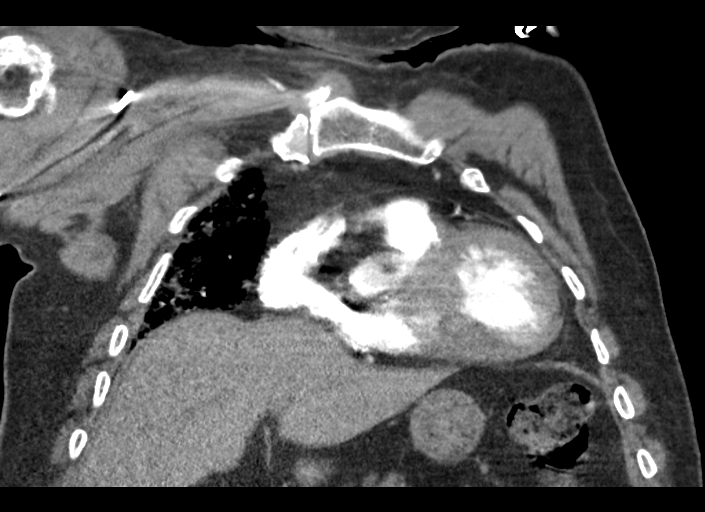
[im 67/101  soft-tissue]
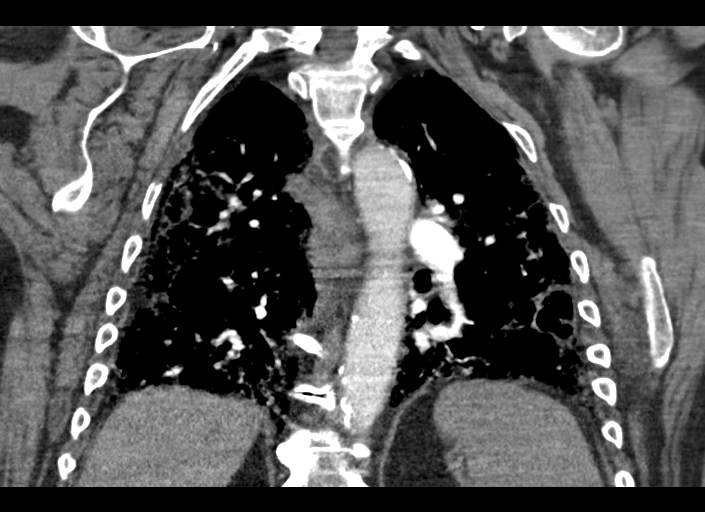

[18 of 46 positions shown; findings below may reference images not displayed]

FINDINGS: Cardiovascular: This is a technically adequate evaluation of the
pulmonary vasculature. No filling defects or pulmonary emboli.

The heart is enlarged without pericardial effusion. Extensive
atherosclerosis of the thoracic aorta without aneurysm or
dissection.

Mediastinum/Nodes: No enlarged mediastinal, hilar, or axillary lymph
nodes. Thyroid gland, trachea, and esophagus demonstrate no
significant findings. Small hiatal hernia.

Lungs/Pleura: There has been progression of the interstitial and
ground-glass opacity seen previously, consistent with worsening
infection or edema. There is mild diffuse bronchiectasis, with areas
of bibasilar scarring. No effusion or pneumothorax. Central airways
are patent.

Upper Abdomen: No acute abnormality.

Musculoskeletal: No acute or destructive bony lesions. Reconstructed
images demonstrate no additional findings.

Review of the MIP images confirms the above findings.
IMPRESSION: 1. No evidence of pulmonary embolus.
2. Progressive interstitial and ground-glass opacities throughout
the lungs, most consistent with worsening I2RFQ-CG pneumonia.
3. Aortic Atherosclerosis ([id number]-[id number]) and Emphysema ([id number]).

## 2022-03-17 NOTE — Telephone Encounter (Signed)
Requested Prescriptions  Pending Prescriptions Disp Refills  . donepezil (ARICEPT) 10 MG tablet [Pharmacy Med Name: DONEPEZIL '10MG'$  TABLETS] 30 tablet 2    Sig: TAKE 1 TABLET(10 MG) BY MOUTH AT BEDTIME     Neurology:  Alzheimer's Agents Passed - 03/16/2022  9:27 PM      Passed - Valid encounter within last 6 months    Recent Outpatient Visits          3 months ago Type 2 diabetes mellitus with stage 3 chronic kidney disease, with long-term current use of insulin, unspecified whether stage 3a or 3b CKD ([place])   [place], Megan P, DO   1 year ago Uncontrolled type 2 diabetes mellitus with chronic kidney disease ([place])   [place], Lauren A, NP   1 year ago 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[place])   [place], Jolene T, NP   1 year ago Uncontrolled type 2 diabetes mellitus with chronic kidney disease ([place])   [place], Jolene T, NP   1 year ago Skin ulcer of forearm with fat layer exposed ([place])   [place], Scheryl Darter, NP

## 2022-03-18 DIAGNOSIS — M6281 Muscle weakness (generalized): Secondary | ICD-10-CM | POA: Diagnosis not present

## 2022-03-18 DIAGNOSIS — R2681 Unsteadiness on feet: Secondary | ICD-10-CM | POA: Diagnosis not present

## 2022-03-19 DIAGNOSIS — R2681 Unsteadiness on feet: Secondary | ICD-10-CM | POA: Diagnosis not present

## 2022-03-19 DIAGNOSIS — M6281 Muscle weakness (generalized): Secondary | ICD-10-CM | POA: Diagnosis not present

## 2022-03-20 DIAGNOSIS — R2681 Unsteadiness on feet: Secondary | ICD-10-CM | POA: Diagnosis not present

## 2022-03-20 DIAGNOSIS — M6281 Muscle weakness (generalized): Secondary | ICD-10-CM | POA: Diagnosis not present

## 2022-03-21 DIAGNOSIS — R2681 Unsteadiness on feet: Secondary | ICD-10-CM | POA: Diagnosis not present

## 2022-03-21 DIAGNOSIS — M6281 Muscle weakness (generalized): Secondary | ICD-10-CM | POA: Diagnosis not present

## 2022-03-23 DIAGNOSIS — R2681 Unsteadiness on feet: Secondary | ICD-10-CM | POA: Diagnosis not present

## 2022-03-23 DIAGNOSIS — M6281 Muscle weakness (generalized): Secondary | ICD-10-CM | POA: Diagnosis not present

## 2022-03-24 DIAGNOSIS — R2681 Unsteadiness on feet: Secondary | ICD-10-CM | POA: Diagnosis not present

## 2022-03-24 DIAGNOSIS — M6281 Muscle weakness (generalized): Secondary | ICD-10-CM | POA: Diagnosis not present

## 2022-03-25 DIAGNOSIS — M6281 Muscle weakness (generalized): Secondary | ICD-10-CM | POA: Diagnosis not present

## 2022-03-25 DIAGNOSIS — R2681 Unsteadiness on feet: Secondary | ICD-10-CM | POA: Diagnosis not present

## 2022-03-26 DIAGNOSIS — R2681 Unsteadiness on feet: Secondary | ICD-10-CM | POA: Diagnosis not present

## 2022-03-26 DIAGNOSIS — M6281 Muscle weakness (generalized): Secondary | ICD-10-CM | POA: Diagnosis not present

## 2022-03-28 DIAGNOSIS — M6281 Muscle weakness (generalized): Secondary | ICD-10-CM | POA: Diagnosis not present

## 2022-03-28 DIAGNOSIS — R2681 Unsteadiness on feet: Secondary | ICD-10-CM | POA: Diagnosis not present

## 2022-03-29 DIAGNOSIS — N183 Chronic kidney disease, stage 3 unspecified: Secondary | ICD-10-CM | POA: Diagnosis not present

## 2022-03-29 DIAGNOSIS — R2681 Unsteadiness on feet: Secondary | ICD-10-CM | POA: Diagnosis not present

## 2022-03-29 DIAGNOSIS — U071 COVID-19: Secondary | ICD-10-CM | POA: Diagnosis not present

## 2022-03-29 DIAGNOSIS — M6281 Muscle weakness (generalized): Secondary | ICD-10-CM | POA: Diagnosis not present

## 2022-03-30 DIAGNOSIS — R2681 Unsteadiness on feet: Secondary | ICD-10-CM | POA: Diagnosis not present

## 2022-03-30 DIAGNOSIS — M6281 Muscle weakness (generalized): Secondary | ICD-10-CM | POA: Diagnosis not present

## 2022-03-31 DIAGNOSIS — M6281 Muscle weakness (generalized): Secondary | ICD-10-CM | POA: Diagnosis not present

## 2022-03-31 DIAGNOSIS — R2681 Unsteadiness on feet: Secondary | ICD-10-CM | POA: Diagnosis not present

## 2022-04-01 DIAGNOSIS — M6281 Muscle weakness (generalized): Secondary | ICD-10-CM | POA: Diagnosis not present

## 2022-04-01 DIAGNOSIS — R2681 Unsteadiness on feet: Secondary | ICD-10-CM | POA: Diagnosis not present

## 2022-04-02 DIAGNOSIS — R2681 Unsteadiness on feet: Secondary | ICD-10-CM | POA: Diagnosis not present

## 2022-04-02 DIAGNOSIS — M6281 Muscle weakness (generalized): Secondary | ICD-10-CM | POA: Diagnosis not present

## 2022-04-03 DIAGNOSIS — M6281 Muscle weakness (generalized): Secondary | ICD-10-CM | POA: Diagnosis not present

## 2022-04-03 DIAGNOSIS — R2681 Unsteadiness on feet: Secondary | ICD-10-CM | POA: Diagnosis not present

## 2022-04-06 DIAGNOSIS — R2681 Unsteadiness on feet: Secondary | ICD-10-CM | POA: Diagnosis not present

## 2022-04-06 DIAGNOSIS — M6281 Muscle weakness (generalized): Secondary | ICD-10-CM | POA: Diagnosis not present

## 2022-04-06 DIAGNOSIS — U071 COVID-19: Secondary | ICD-10-CM | POA: Diagnosis not present

## 2022-04-06 DIAGNOSIS — J9601 Acute respiratory failure with hypoxia: Secondary | ICD-10-CM | POA: Diagnosis not present

## 2022-04-07 DIAGNOSIS — M6281 Muscle weakness (generalized): Secondary | ICD-10-CM | POA: Diagnosis not present

## 2022-04-07 DIAGNOSIS — R2681 Unsteadiness on feet: Secondary | ICD-10-CM | POA: Diagnosis not present

## 2022-04-08 DIAGNOSIS — M6281 Muscle weakness (generalized): Secondary | ICD-10-CM | POA: Diagnosis not present

## 2022-04-08 DIAGNOSIS — R2681 Unsteadiness on feet: Secondary | ICD-10-CM | POA: Diagnosis not present

## 2022-04-09 DIAGNOSIS — E119 Type 2 diabetes mellitus without complications: Secondary | ICD-10-CM | POA: Diagnosis not present

## 2022-04-09 DIAGNOSIS — Z961 Presence of intraocular lens: Secondary | ICD-10-CM | POA: Diagnosis not present

## 2022-04-11 DIAGNOSIS — M6281 Muscle weakness (generalized): Secondary | ICD-10-CM | POA: Diagnosis not present

## 2022-04-11 DIAGNOSIS — R2681 Unsteadiness on feet: Secondary | ICD-10-CM | POA: Diagnosis not present

## 2022-04-13 DIAGNOSIS — M6281 Muscle weakness (generalized): Secondary | ICD-10-CM | POA: Diagnosis not present

## 2022-04-13 DIAGNOSIS — R2681 Unsteadiness on feet: Secondary | ICD-10-CM | POA: Diagnosis not present

## 2022-04-14 DIAGNOSIS — M6281 Muscle weakness (generalized): Secondary | ICD-10-CM | POA: Diagnosis not present

## 2022-04-14 DIAGNOSIS — R2681 Unsteadiness on feet: Secondary | ICD-10-CM | POA: Diagnosis not present

## 2022-04-15 DIAGNOSIS — E785 Hyperlipidemia, unspecified: Secondary | ICD-10-CM | POA: Diagnosis not present

## 2022-04-15 DIAGNOSIS — I509 Heart failure, unspecified: Secondary | ICD-10-CM | POA: Diagnosis not present

## 2022-04-15 DIAGNOSIS — I10 Essential (primary) hypertension: Secondary | ICD-10-CM | POA: Diagnosis not present

## 2022-04-15 DIAGNOSIS — E1165 Type 2 diabetes mellitus with hyperglycemia: Secondary | ICD-10-CM | POA: Diagnosis not present

## 2022-04-29 DIAGNOSIS — U071 COVID-19: Secondary | ICD-10-CM | POA: Diagnosis not present

## 2022-04-29 DIAGNOSIS — N183 Chronic kidney disease, stage 3 unspecified: Secondary | ICD-10-CM | POA: Diagnosis not present

## 2022-05-01 DIAGNOSIS — E1165 Type 2 diabetes mellitus with hyperglycemia: Secondary | ICD-10-CM | POA: Diagnosis not present

## 2022-05-01 DIAGNOSIS — I509 Heart failure, unspecified: Secondary | ICD-10-CM | POA: Diagnosis not present

## 2022-05-01 DIAGNOSIS — E785 Hyperlipidemia, unspecified: Secondary | ICD-10-CM | POA: Diagnosis not present

## 2022-05-01 DIAGNOSIS — I10 Essential (primary) hypertension: Secondary | ICD-10-CM | POA: Diagnosis not present

## 2022-05-19 DIAGNOSIS — I509 Heart failure, unspecified: Secondary | ICD-10-CM | POA: Diagnosis not present

## 2022-05-19 DIAGNOSIS — E785 Hyperlipidemia, unspecified: Secondary | ICD-10-CM | POA: Diagnosis not present

## 2022-05-29 DIAGNOSIS — N183 Chronic kidney disease, stage 3 unspecified: Secondary | ICD-10-CM | POA: Diagnosis not present

## 2022-05-29 DIAGNOSIS — U071 COVID-19: Secondary | ICD-10-CM | POA: Diagnosis not present

## 2022-06-02 DIAGNOSIS — E1159 Type 2 diabetes mellitus with other circulatory complications: Secondary | ICD-10-CM | POA: Diagnosis not present

## 2022-06-02 DIAGNOSIS — B351 Tinea unguium: Secondary | ICD-10-CM | POA: Diagnosis not present

## 2022-06-16 DIAGNOSIS — R059 Cough, unspecified: Secondary | ICD-10-CM | POA: Diagnosis not present

## 2022-06-16 DIAGNOSIS — R058 Other specified cough: Secondary | ICD-10-CM | POA: Diagnosis not present

## 2022-06-16 DIAGNOSIS — R0989 Other specified symptoms and signs involving the circulatory and respiratory systems: Secondary | ICD-10-CM | POA: Diagnosis not present

## 2022-06-17 DIAGNOSIS — R0989 Other specified symptoms and signs involving the circulatory and respiratory systems: Secondary | ICD-10-CM | POA: Diagnosis not present

## 2022-06-17 DIAGNOSIS — J029 Acute pharyngitis, unspecified: Secondary | ICD-10-CM | POA: Diagnosis not present

## 2022-06-17 DIAGNOSIS — R058 Other specified cough: Secondary | ICD-10-CM | POA: Diagnosis not present

## 2022-06-17 DIAGNOSIS — J069 Acute upper respiratory infection, unspecified: Secondary | ICD-10-CM | POA: Diagnosis not present

## 2022-06-19 DIAGNOSIS — K5900 Constipation, unspecified: Secondary | ICD-10-CM | POA: Diagnosis not present

## 2022-06-19 DIAGNOSIS — E1165 Type 2 diabetes mellitus with hyperglycemia: Secondary | ICD-10-CM | POA: Diagnosis not present

## 2022-06-19 DIAGNOSIS — I10 Essential (primary) hypertension: Secondary | ICD-10-CM | POA: Diagnosis not present

## 2022-06-19 DIAGNOSIS — E785 Hyperlipidemia, unspecified: Secondary | ICD-10-CM | POA: Diagnosis not present

## 2022-06-19 DIAGNOSIS — I509 Heart failure, unspecified: Secondary | ICD-10-CM | POA: Diagnosis not present

## 2022-06-29 DIAGNOSIS — N183 Chronic kidney disease, stage 3 unspecified: Secondary | ICD-10-CM | POA: Diagnosis not present

## 2022-06-29 DIAGNOSIS — U071 COVID-19: Secondary | ICD-10-CM | POA: Diagnosis not present

## 2022-07-30 DIAGNOSIS — N183 Chronic kidney disease, stage 3 unspecified: Secondary | ICD-10-CM | POA: Diagnosis not present

## 2022-07-30 DIAGNOSIS — U071 COVID-19: Secondary | ICD-10-CM | POA: Diagnosis not present

## 2022-08-13 DIAGNOSIS — E1159 Type 2 diabetes mellitus with other circulatory complications: Secondary | ICD-10-CM | POA: Diagnosis not present

## 2022-08-13 DIAGNOSIS — B351 Tinea unguium: Secondary | ICD-10-CM | POA: Diagnosis not present

## 2022-08-16 DIAGNOSIS — Z741 Need for assistance with personal care: Secondary | ICD-10-CM | POA: Diagnosis not present

## 2022-08-16 DIAGNOSIS — M6281 Muscle weakness (generalized): Secondary | ICD-10-CM | POA: Diagnosis not present

## 2022-08-17 DIAGNOSIS — E1165 Type 2 diabetes mellitus with hyperglycemia: Secondary | ICD-10-CM | POA: Diagnosis not present

## 2022-08-17 DIAGNOSIS — Z741 Need for assistance with personal care: Secondary | ICD-10-CM | POA: Diagnosis not present

## 2022-08-17 DIAGNOSIS — M6281 Muscle weakness (generalized): Secondary | ICD-10-CM | POA: Diagnosis not present

## 2022-08-18 DIAGNOSIS — M6281 Muscle weakness (generalized): Secondary | ICD-10-CM | POA: Diagnosis not present

## 2022-08-18 DIAGNOSIS — Z741 Need for assistance with personal care: Secondary | ICD-10-CM | POA: Diagnosis not present

## 2022-08-19 DIAGNOSIS — M6281 Muscle weakness (generalized): Secondary | ICD-10-CM | POA: Diagnosis not present

## 2022-08-19 DIAGNOSIS — Z741 Need for assistance with personal care: Secondary | ICD-10-CM | POA: Diagnosis not present

## 2022-08-20 DIAGNOSIS — M6281 Muscle weakness (generalized): Secondary | ICD-10-CM | POA: Diagnosis not present

## 2022-08-20 DIAGNOSIS — Z741 Need for assistance with personal care: Secondary | ICD-10-CM | POA: Diagnosis not present

## 2022-08-26 DIAGNOSIS — Z741 Need for assistance with personal care: Secondary | ICD-10-CM | POA: Diagnosis not present

## 2022-08-26 DIAGNOSIS — M6281 Muscle weakness (generalized): Secondary | ICD-10-CM | POA: Diagnosis not present

## 2022-08-28 DIAGNOSIS — M6281 Muscle weakness (generalized): Secondary | ICD-10-CM | POA: Diagnosis not present

## 2022-08-28 DIAGNOSIS — N183 Chronic kidney disease, stage 3 unspecified: Secondary | ICD-10-CM | POA: Diagnosis not present

## 2022-08-28 DIAGNOSIS — U071 COVID-19: Secondary | ICD-10-CM | POA: Diagnosis not present

## 2022-08-28 DIAGNOSIS — Z741 Need for assistance with personal care: Secondary | ICD-10-CM | POA: Diagnosis not present

## 2022-08-31 DIAGNOSIS — E785 Hyperlipidemia, unspecified: Secondary | ICD-10-CM | POA: Diagnosis not present

## 2022-08-31 DIAGNOSIS — M6281 Muscle weakness (generalized): Secondary | ICD-10-CM | POA: Diagnosis not present

## 2022-08-31 DIAGNOSIS — Z741 Need for assistance with personal care: Secondary | ICD-10-CM | POA: Diagnosis not present

## 2022-08-31 DIAGNOSIS — Z79899 Other long term (current) drug therapy: Secondary | ICD-10-CM | POA: Diagnosis not present

## 2022-08-31 DIAGNOSIS — E1165 Type 2 diabetes mellitus with hyperglycemia: Secondary | ICD-10-CM | POA: Diagnosis not present

## 2022-09-01 DIAGNOSIS — Z741 Need for assistance with personal care: Secondary | ICD-10-CM | POA: Diagnosis not present

## 2022-09-01 DIAGNOSIS — M6281 Muscle weakness (generalized): Secondary | ICD-10-CM | POA: Diagnosis not present

## 2022-09-02 DIAGNOSIS — Z741 Need for assistance with personal care: Secondary | ICD-10-CM | POA: Diagnosis not present

## 2022-09-02 DIAGNOSIS — M6281 Muscle weakness (generalized): Secondary | ICD-10-CM | POA: Diagnosis not present

## 2022-09-03 DIAGNOSIS — Z741 Need for assistance with personal care: Secondary | ICD-10-CM | POA: Diagnosis not present

## 2022-09-03 DIAGNOSIS — M6281 Muscle weakness (generalized): Secondary | ICD-10-CM | POA: Diagnosis not present

## 2022-09-07 DIAGNOSIS — Z5181 Encounter for therapeutic drug level monitoring: Secondary | ICD-10-CM | POA: Diagnosis not present

## 2022-09-07 DIAGNOSIS — E1165 Type 2 diabetes mellitus with hyperglycemia: Secondary | ICD-10-CM | POA: Diagnosis not present

## 2022-09-07 DIAGNOSIS — E119 Type 2 diabetes mellitus without complications: Secondary | ICD-10-CM | POA: Diagnosis not present

## 2022-09-07 DIAGNOSIS — I10 Essential (primary) hypertension: Secondary | ICD-10-CM | POA: Diagnosis not present

## 2022-09-07 DIAGNOSIS — E785 Hyperlipidemia, unspecified: Secondary | ICD-10-CM | POA: Diagnosis not present

## 2022-09-07 DIAGNOSIS — Z79899 Other long term (current) drug therapy: Secondary | ICD-10-CM | POA: Diagnosis not present

## 2022-09-18 ENCOUNTER — Telehealth: Payer: Self-pay

## 2022-09-18 NOTE — Progress Notes (Cosign Needed)
Note marked as an error due to patient being in Palliative care.   Rance Muir, RMA

## 2022-09-28 DIAGNOSIS — U071 COVID-19: Secondary | ICD-10-CM | POA: Diagnosis not present

## 2022-09-28 DIAGNOSIS — N183 Chronic kidney disease, stage 3 unspecified: Secondary | ICD-10-CM | POA: Diagnosis not present

## 2022-10-19 DIAGNOSIS — B351 Tinea unguium: Secondary | ICD-10-CM | POA: Diagnosis not present

## 2022-10-19 DIAGNOSIS — E1159 Type 2 diabetes mellitus with other circulatory complications: Secondary | ICD-10-CM | POA: Diagnosis not present

## 2022-10-28 DIAGNOSIS — U071 COVID-19: Secondary | ICD-10-CM | POA: Diagnosis not present

## 2022-10-28 DIAGNOSIS — N183 Chronic kidney disease, stage 3 unspecified: Secondary | ICD-10-CM | POA: Diagnosis not present

## 2022-11-16 DIAGNOSIS — E785 Hyperlipidemia, unspecified: Secondary | ICD-10-CM | POA: Diagnosis not present

## 2022-11-28 DIAGNOSIS — N183 Chronic kidney disease, stage 3 unspecified: Secondary | ICD-10-CM | POA: Diagnosis not present

## 2022-11-28 DIAGNOSIS — U071 COVID-19: Secondary | ICD-10-CM | POA: Diagnosis not present

## 2022-12-28 DIAGNOSIS — U071 COVID-19: Secondary | ICD-10-CM | POA: Diagnosis not present

## 2022-12-28 DIAGNOSIS — N183 Chronic kidney disease, stage 3 unspecified: Secondary | ICD-10-CM | POA: Diagnosis not present

## 2023-05-04 DIAGNOSIS — I10 Essential (primary) hypertension: Secondary | ICD-10-CM | POA: Diagnosis not present

## 2023-05-04 DIAGNOSIS — R059 Cough, unspecified: Secondary | ICD-10-CM | POA: Diagnosis not present

## 2023-05-04 DIAGNOSIS — R410 Disorientation, unspecified: Secondary | ICD-10-CM | POA: Diagnosis not present

## 2023-05-05 DIAGNOSIS — Z961 Presence of intraocular lens: Secondary | ICD-10-CM | POA: Diagnosis not present

## 2023-05-05 DIAGNOSIS — E119 Type 2 diabetes mellitus without complications: Secondary | ICD-10-CM | POA: Diagnosis not present

## 2023-05-05 DIAGNOSIS — H524 Presbyopia: Secondary | ICD-10-CM | POA: Diagnosis not present

## 2023-05-10 DIAGNOSIS — E785 Hyperlipidemia, unspecified: Secondary | ICD-10-CM | POA: Diagnosis not present

## 2023-05-10 DIAGNOSIS — I509 Heart failure, unspecified: Secondary | ICD-10-CM | POA: Diagnosis not present

## 2023-05-10 DIAGNOSIS — E1165 Type 2 diabetes mellitus with hyperglycemia: Secondary | ICD-10-CM | POA: Diagnosis not present

## 2023-05-10 DIAGNOSIS — Z794 Long term (current) use of insulin: Secondary | ICD-10-CM | POA: Diagnosis not present

## 2023-06-04 DIAGNOSIS — Z741 Need for assistance with personal care: Secondary | ICD-10-CM | POA: Diagnosis not present

## 2023-06-04 DIAGNOSIS — M6281 Muscle weakness (generalized): Secondary | ICD-10-CM | POA: Diagnosis not present

## 2023-06-07 DIAGNOSIS — R059 Cough, unspecified: Secondary | ICD-10-CM | POA: Diagnosis not present

## 2023-06-07 DIAGNOSIS — I10 Essential (primary) hypertension: Secondary | ICD-10-CM | POA: Diagnosis not present

## 2023-06-07 DIAGNOSIS — R410 Disorientation, unspecified: Secondary | ICD-10-CM | POA: Diagnosis not present

## 2023-06-15 DIAGNOSIS — E1165 Type 2 diabetes mellitus with hyperglycemia: Secondary | ICD-10-CM | POA: Diagnosis not present

## 2023-06-15 DIAGNOSIS — E785 Hyperlipidemia, unspecified: Secondary | ICD-10-CM | POA: Diagnosis not present
